# Patient Record
Sex: Female | Born: 1975 | Race: Black or African American | Hispanic: No | Marital: Married
Health system: Southern US, Community
[De-identification: ages and names within clinical notes are randomized; demographics above are authoritative.]

## PROBLEM LIST (undated history)

## (undated) ENCOUNTER — Inpatient Hospital Stay (HOSPITAL_COMMUNITY): Payer: Self-pay

## (undated) DIAGNOSIS — Z5189 Encounter for other specified aftercare: Secondary | ICD-10-CM

## (undated) DIAGNOSIS — F32A Depression, unspecified: Secondary | ICD-10-CM

## (undated) DIAGNOSIS — IMO0002 Reserved for concepts with insufficient information to code with codable children: Secondary | ICD-10-CM

## (undated) DIAGNOSIS — G4730 Sleep apnea, unspecified: Secondary | ICD-10-CM

## (undated) DIAGNOSIS — M797 Fibromyalgia: Secondary | ICD-10-CM

## (undated) DIAGNOSIS — F329 Major depressive disorder, single episode, unspecified: Secondary | ICD-10-CM

## (undated) DIAGNOSIS — R42 Dizziness and giddiness: Secondary | ICD-10-CM

## (undated) DIAGNOSIS — Z8489 Family history of other specified conditions: Secondary | ICD-10-CM

## (undated) DIAGNOSIS — R51 Headache: Secondary | ICD-10-CM

## (undated) DIAGNOSIS — F419 Anxiety disorder, unspecified: Secondary | ICD-10-CM

## (undated) DIAGNOSIS — Z8742 Personal history of other diseases of the female genital tract: Secondary | ICD-10-CM

## (undated) DIAGNOSIS — E0500 Thyrotoxicosis with diffuse goiter without thyrotoxic crisis or storm: Secondary | ICD-10-CM

## (undated) DIAGNOSIS — Z87898 Personal history of other specified conditions: Secondary | ICD-10-CM

## (undated) DIAGNOSIS — G7000 Myasthenia gravis without (acute) exacerbation: Secondary | ICD-10-CM

## (undated) DIAGNOSIS — B009 Herpesviral infection, unspecified: Secondary | ICD-10-CM

## (undated) DIAGNOSIS — N979 Female infertility, unspecified: Secondary | ICD-10-CM

## (undated) DIAGNOSIS — R079 Chest pain, unspecified: Secondary | ICD-10-CM

## (undated) DIAGNOSIS — K219 Gastro-esophageal reflux disease without esophagitis: Secondary | ICD-10-CM

## (undated) DIAGNOSIS — F319 Bipolar disorder, unspecified: Secondary | ICD-10-CM

## (undated) DIAGNOSIS — G500 Trigeminal neuralgia: Secondary | ICD-10-CM

## (undated) HISTORY — DX: Herpesviral infection, unspecified: B00.9

## (undated) HISTORY — DX: Headache: R51

## (undated) HISTORY — DX: Thyrotoxicosis with diffuse goiter without thyrotoxic crisis or storm: E05.00

## (undated) HISTORY — DX: Major depressive disorder, single episode, unspecified: F32.9

## (undated) HISTORY — DX: Bipolar disorder, unspecified: F31.9

## (undated) HISTORY — DX: Depression, unspecified: F32.A

## (undated) HISTORY — DX: Personal history of other diseases of the female genital tract: Z87.42

## (undated) HISTORY — PX: WISDOM TOOTH EXTRACTION: SHX21

## (undated) HISTORY — DX: Personal history of other specified conditions: Z87.898

## (undated) HISTORY — DX: Female infertility, unspecified: N97.9

## (undated) HISTORY — DX: Reserved for concepts with insufficient information to code with codable children: IMO0002

---

## 1996-03-20 HISTORY — PX: OTHER SURGICAL HISTORY: SHX169

## 1997-06-24 ENCOUNTER — Inpatient Hospital Stay (HOSPITAL_COMMUNITY): Admission: AD | Admit: 1997-06-24 | Discharge: 1997-06-24 | Payer: Self-pay | Admitting: Obstetrics

## 1997-06-29 ENCOUNTER — Ambulatory Visit (HOSPITAL_COMMUNITY): Admission: RE | Admit: 1997-06-29 | Discharge: 1997-06-29 | Payer: Self-pay | Admitting: Pediatrics

## 1997-07-16 ENCOUNTER — Ambulatory Visit (HOSPITAL_COMMUNITY): Admission: RE | Admit: 1997-07-16 | Discharge: 1997-07-16 | Payer: Self-pay | Admitting: Pediatrics

## 1997-08-25 ENCOUNTER — Ambulatory Visit (HOSPITAL_COMMUNITY): Admission: RE | Admit: 1997-08-25 | Discharge: 1997-08-25 | Payer: Self-pay | Admitting: Pediatrics

## 1997-09-14 ENCOUNTER — Encounter: Admission: RE | Admit: 1997-09-14 | Discharge: 1997-12-13 | Payer: Self-pay | Admitting: Pediatrics

## 1997-09-22 ENCOUNTER — Other Ambulatory Visit: Admission: RE | Admit: 1997-09-22 | Discharge: 1997-09-22 | Payer: Self-pay | Admitting: Obstetrics

## 1997-10-20 ENCOUNTER — Inpatient Hospital Stay (HOSPITAL_COMMUNITY): Admission: AD | Admit: 1997-10-20 | Discharge: 1997-10-20 | Payer: Self-pay | Admitting: *Deleted

## 1997-12-24 ENCOUNTER — Encounter: Payer: Self-pay | Admitting: Emergency Medicine

## 1997-12-24 ENCOUNTER — Emergency Department (HOSPITAL_COMMUNITY): Admission: EM | Admit: 1997-12-24 | Discharge: 1997-12-24 | Payer: Self-pay | Admitting: Emergency Medicine

## 1997-12-31 ENCOUNTER — Ambulatory Visit (HOSPITAL_COMMUNITY): Admission: RE | Admit: 1997-12-31 | Discharge: 1997-12-31 | Payer: Self-pay | Admitting: Pediatrics

## 1998-01-08 ENCOUNTER — Other Ambulatory Visit: Admission: RE | Admit: 1998-01-08 | Discharge: 1998-01-08 | Payer: Self-pay | Admitting: Obstetrics

## 1998-01-11 ENCOUNTER — Ambulatory Visit (HOSPITAL_COMMUNITY): Admission: RE | Admit: 1998-01-11 | Discharge: 1998-01-11 | Payer: Self-pay | Admitting: Pediatrics

## 1998-01-12 ENCOUNTER — Encounter: Payer: Self-pay | Admitting: Pediatrics

## 1998-01-19 ENCOUNTER — Encounter: Payer: Self-pay | Admitting: Pediatrics

## 1998-01-19 ENCOUNTER — Ambulatory Visit (HOSPITAL_COMMUNITY): Admission: RE | Admit: 1998-01-19 | Discharge: 1998-01-19 | Payer: Self-pay | Admitting: Pediatrics

## 1998-03-02 ENCOUNTER — Ambulatory Visit (HOSPITAL_COMMUNITY): Admission: RE | Admit: 1998-03-02 | Discharge: 1998-03-02 | Payer: Self-pay | Admitting: Pediatrics

## 1998-03-05 ENCOUNTER — Ambulatory Visit (HOSPITAL_COMMUNITY): Admission: RE | Admit: 1998-03-05 | Discharge: 1998-03-05 | Payer: Self-pay | Admitting: Pediatrics

## 1998-04-04 ENCOUNTER — Emergency Department (HOSPITAL_COMMUNITY): Admission: EM | Admit: 1998-04-04 | Discharge: 1998-04-05 | Payer: Self-pay | Admitting: Emergency Medicine

## 1998-04-05 ENCOUNTER — Encounter: Payer: Self-pay | Admitting: Emergency Medicine

## 1998-04-22 ENCOUNTER — Ambulatory Visit (HOSPITAL_COMMUNITY): Admission: RE | Admit: 1998-04-22 | Discharge: 1998-04-22 | Payer: Self-pay | Admitting: Pediatrics

## 1998-05-03 ENCOUNTER — Inpatient Hospital Stay (HOSPITAL_COMMUNITY): Admission: AD | Admit: 1998-05-03 | Discharge: 1998-05-03 | Payer: Self-pay | Admitting: Obstetrics

## 1998-08-31 ENCOUNTER — Ambulatory Visit (HOSPITAL_COMMUNITY): Admission: RE | Admit: 1998-08-31 | Discharge: 1998-08-31 | Payer: Self-pay | Admitting: Pediatrics

## 1998-11-03 ENCOUNTER — Ambulatory Visit (HOSPITAL_COMMUNITY): Admission: RE | Admit: 1998-11-03 | Discharge: 1998-11-03 | Payer: Self-pay | Admitting: Pediatrics

## 1998-11-29 ENCOUNTER — Inpatient Hospital Stay (HOSPITAL_COMMUNITY): Admission: AD | Admit: 1998-11-29 | Discharge: 1998-11-29 | Payer: Self-pay | Admitting: Internal Medicine

## 1998-12-18 ENCOUNTER — Emergency Department (HOSPITAL_COMMUNITY): Admission: EM | Admit: 1998-12-18 | Discharge: 1998-12-19 | Payer: Self-pay | Admitting: Emergency Medicine

## 1998-12-19 ENCOUNTER — Encounter: Payer: Self-pay | Admitting: Emergency Medicine

## 1999-02-13 ENCOUNTER — Inpatient Hospital Stay (HOSPITAL_COMMUNITY): Admission: AD | Admit: 1999-02-13 | Discharge: 1999-02-13 | Payer: Self-pay | Admitting: *Deleted

## 1999-02-22 ENCOUNTER — Other Ambulatory Visit: Admission: RE | Admit: 1999-02-22 | Discharge: 1999-02-22 | Payer: Self-pay | Admitting: Obstetrics

## 1999-06-21 ENCOUNTER — Emergency Department (HOSPITAL_COMMUNITY): Admission: EM | Admit: 1999-06-21 | Discharge: 1999-06-21 | Payer: Self-pay | Admitting: Emergency Medicine

## 1999-08-26 ENCOUNTER — Emergency Department (HOSPITAL_COMMUNITY): Admission: EM | Admit: 1999-08-26 | Discharge: 1999-08-26 | Payer: Self-pay | Admitting: Emergency Medicine

## 1999-09-01 ENCOUNTER — Emergency Department (HOSPITAL_COMMUNITY): Admission: EM | Admit: 1999-09-01 | Discharge: 1999-09-01 | Payer: Self-pay | Admitting: *Deleted

## 1999-09-08 ENCOUNTER — Encounter: Payer: Self-pay | Admitting: Emergency Medicine

## 1999-09-08 ENCOUNTER — Emergency Department (HOSPITAL_COMMUNITY): Admission: EM | Admit: 1999-09-08 | Discharge: 1999-09-08 | Payer: Self-pay | Admitting: Emergency Medicine

## 1999-09-09 ENCOUNTER — Encounter (INDEPENDENT_AMBULATORY_CARE_PROVIDER_SITE_OTHER): Payer: Self-pay | Admitting: Specialist

## 1999-09-09 ENCOUNTER — Encounter: Payer: Self-pay | Admitting: General Surgery

## 1999-09-09 ENCOUNTER — Inpatient Hospital Stay: Admission: EM | Admit: 1999-09-09 | Discharge: 1999-09-11 | Payer: Self-pay | Admitting: General Surgery

## 1999-09-10 ENCOUNTER — Encounter: Payer: Self-pay | Admitting: General Surgery

## 1999-10-24 ENCOUNTER — Emergency Department (HOSPITAL_COMMUNITY): Admission: EM | Admit: 1999-10-24 | Discharge: 1999-10-25 | Payer: Self-pay

## 2000-01-03 ENCOUNTER — Inpatient Hospital Stay (HOSPITAL_COMMUNITY): Admission: AD | Admit: 2000-01-03 | Discharge: 2000-01-03 | Payer: Self-pay | Admitting: Obstetrics

## 2000-01-04 ENCOUNTER — Encounter: Admission: RE | Admit: 2000-01-04 | Discharge: 2000-01-04 | Payer: Self-pay | Admitting: Neurology

## 2000-01-04 ENCOUNTER — Encounter: Payer: Self-pay | Admitting: Neurology

## 2000-02-25 ENCOUNTER — Encounter: Payer: Self-pay | Admitting: Emergency Medicine

## 2000-02-25 ENCOUNTER — Emergency Department (HOSPITAL_COMMUNITY): Admission: EM | Admit: 2000-02-25 | Discharge: 2000-02-25 | Payer: Self-pay | Admitting: Emergency Medicine

## 2000-03-23 ENCOUNTER — Encounter: Payer: Self-pay | Admitting: Emergency Medicine

## 2000-03-23 ENCOUNTER — Emergency Department (HOSPITAL_COMMUNITY): Admission: EM | Admit: 2000-03-23 | Discharge: 2000-03-23 | Payer: Self-pay | Admitting: Emergency Medicine

## 2000-05-10 ENCOUNTER — Inpatient Hospital Stay (HOSPITAL_COMMUNITY): Admission: AD | Admit: 2000-05-10 | Discharge: 2000-05-10 | Payer: Self-pay | Admitting: Obstetrics

## 2000-05-24 ENCOUNTER — Encounter: Admission: RE | Admit: 2000-05-24 | Discharge: 2000-06-14 | Payer: Self-pay | Admitting: Orthopedic Surgery

## 2000-11-15 ENCOUNTER — Emergency Department (HOSPITAL_COMMUNITY): Admission: EM | Admit: 2000-11-15 | Discharge: 2000-11-15 | Payer: Self-pay | Admitting: Emergency Medicine

## 2000-11-17 ENCOUNTER — Emergency Department (HOSPITAL_COMMUNITY): Admission: EM | Admit: 2000-11-17 | Discharge: 2000-11-17 | Payer: Self-pay | Admitting: Emergency Medicine

## 2000-11-28 ENCOUNTER — Encounter: Admission: RE | Admit: 2000-11-28 | Discharge: 2000-12-26 | Payer: Self-pay | Admitting: Pediatrics

## 2000-12-02 ENCOUNTER — Inpatient Hospital Stay (HOSPITAL_COMMUNITY): Admission: AD | Admit: 2000-12-02 | Discharge: 2000-12-02 | Payer: Self-pay | Admitting: Obstetrics

## 2000-12-13 ENCOUNTER — Inpatient Hospital Stay (HOSPITAL_COMMUNITY): Admission: AD | Admit: 2000-12-13 | Discharge: 2000-12-13 | Payer: Self-pay | Admitting: Obstetrics

## 2001-01-25 ENCOUNTER — Emergency Department (HOSPITAL_COMMUNITY): Admission: EM | Admit: 2001-01-25 | Discharge: 2001-01-25 | Payer: Self-pay | Admitting: *Deleted

## 2001-01-26 ENCOUNTER — Emergency Department (HOSPITAL_COMMUNITY): Admission: EM | Admit: 2001-01-26 | Discharge: 2001-01-26 | Payer: Self-pay | Admitting: Emergency Medicine

## 2001-03-20 HISTORY — PX: CHOLECYSTECTOMY: SHX55

## 2001-06-12 ENCOUNTER — Inpatient Hospital Stay (HOSPITAL_COMMUNITY): Admission: AD | Admit: 2001-06-12 | Discharge: 2001-06-12 | Payer: Self-pay | Admitting: *Deleted

## 2001-06-12 ENCOUNTER — Encounter: Admission: RE | Admit: 2001-06-12 | Discharge: 2001-09-10 | Payer: Self-pay | Admitting: Pediatrics

## 2001-08-17 ENCOUNTER — Emergency Department (HOSPITAL_COMMUNITY): Admission: EM | Admit: 2001-08-17 | Discharge: 2001-08-17 | Payer: Self-pay | Admitting: Emergency Medicine

## 2001-11-24 ENCOUNTER — Emergency Department (HOSPITAL_COMMUNITY): Admission: EM | Admit: 2001-11-24 | Discharge: 2001-11-24 | Payer: Self-pay | Admitting: Emergency Medicine

## 2001-11-24 ENCOUNTER — Encounter: Payer: Self-pay | Admitting: Emergency Medicine

## 2001-11-26 ENCOUNTER — Encounter: Admission: RE | Admit: 2001-11-26 | Discharge: 2001-12-19 | Payer: Self-pay | Admitting: Orthopedic Surgery

## 2001-12-16 ENCOUNTER — Other Ambulatory Visit: Admission: RE | Admit: 2001-12-16 | Discharge: 2001-12-16 | Payer: Self-pay | Admitting: Obstetrics and Gynecology

## 2002-02-05 ENCOUNTER — Encounter: Admission: RE | Admit: 2002-02-05 | Discharge: 2002-05-06 | Payer: Self-pay | Admitting: Pediatrics

## 2002-03-03 ENCOUNTER — Ambulatory Visit (HOSPITAL_COMMUNITY): Admission: RE | Admit: 2002-03-03 | Discharge: 2002-03-03 | Payer: Self-pay | Admitting: Obstetrics and Gynecology

## 2002-03-03 ENCOUNTER — Encounter: Payer: Self-pay | Admitting: Obstetrics and Gynecology

## 2002-04-07 ENCOUNTER — Inpatient Hospital Stay (HOSPITAL_COMMUNITY): Admission: AD | Admit: 2002-04-07 | Discharge: 2002-04-07 | Payer: Self-pay | Admitting: Obstetrics and Gynecology

## 2002-04-18 ENCOUNTER — Encounter: Payer: Self-pay | Admitting: Obstetrics and Gynecology

## 2002-04-18 ENCOUNTER — Ambulatory Visit (HOSPITAL_COMMUNITY): Admission: RE | Admit: 2002-04-18 | Discharge: 2002-04-18 | Payer: Self-pay | Admitting: Obstetrics and Gynecology

## 2002-04-22 ENCOUNTER — Inpatient Hospital Stay (HOSPITAL_COMMUNITY): Admission: AD | Admit: 2002-04-22 | Discharge: 2002-04-22 | Payer: Self-pay | Admitting: Obstetrics and Gynecology

## 2002-05-15 ENCOUNTER — Ambulatory Visit (HOSPITAL_COMMUNITY): Admission: RE | Admit: 2002-05-15 | Discharge: 2002-05-15 | Payer: Self-pay | Admitting: Obstetrics and Gynecology

## 2002-05-15 ENCOUNTER — Encounter: Payer: Self-pay | Admitting: Obstetrics and Gynecology

## 2002-06-06 ENCOUNTER — Ambulatory Visit (HOSPITAL_COMMUNITY): Admission: RE | Admit: 2002-06-06 | Discharge: 2002-06-06 | Payer: Self-pay | Admitting: Obstetrics and Gynecology

## 2002-06-06 ENCOUNTER — Encounter: Payer: Self-pay | Admitting: Obstetrics and Gynecology

## 2002-06-16 ENCOUNTER — Inpatient Hospital Stay (HOSPITAL_COMMUNITY): Admission: AD | Admit: 2002-06-16 | Discharge: 2002-06-16 | Payer: Self-pay | Admitting: Obstetrics and Gynecology

## 2002-06-28 ENCOUNTER — Inpatient Hospital Stay (HOSPITAL_COMMUNITY): Admission: AD | Admit: 2002-06-28 | Discharge: 2002-06-28 | Payer: Self-pay | Admitting: Obstetrics and Gynecology

## 2002-07-02 ENCOUNTER — Encounter: Payer: Self-pay | Admitting: Obstetrics and Gynecology

## 2002-07-02 ENCOUNTER — Ambulatory Visit (HOSPITAL_COMMUNITY): Admission: RE | Admit: 2002-07-02 | Discharge: 2002-07-02 | Payer: Self-pay | Admitting: Obstetrics and Gynecology

## 2002-07-03 ENCOUNTER — Ambulatory Visit (HOSPITAL_COMMUNITY): Admission: RE | Admit: 2002-07-03 | Discharge: 2002-07-03 | Payer: Self-pay | Admitting: Obstetrics and Gynecology

## 2002-07-03 ENCOUNTER — Encounter: Payer: Self-pay | Admitting: Obstetrics and Gynecology

## 2002-07-08 ENCOUNTER — Inpatient Hospital Stay (HOSPITAL_COMMUNITY): Admission: AD | Admit: 2002-07-08 | Discharge: 2002-07-08 | Payer: Self-pay | Admitting: Obstetrics and Gynecology

## 2002-07-09 ENCOUNTER — Inpatient Hospital Stay (HOSPITAL_COMMUNITY): Admission: AD | Admit: 2002-07-09 | Discharge: 2002-07-09 | Payer: Self-pay | Admitting: Obstetrics and Gynecology

## 2002-07-15 ENCOUNTER — Inpatient Hospital Stay (HOSPITAL_COMMUNITY): Admission: AD | Admit: 2002-07-15 | Discharge: 2002-07-16 | Payer: Self-pay | Admitting: Obstetrics and Gynecology

## 2002-07-16 ENCOUNTER — Encounter: Payer: Self-pay | Admitting: Obstetrics and Gynecology

## 2002-07-21 ENCOUNTER — Inpatient Hospital Stay (HOSPITAL_COMMUNITY): Admission: AD | Admit: 2002-07-21 | Discharge: 2002-07-24 | Payer: Self-pay | Admitting: Obstetrics and Gynecology

## 2002-09-05 ENCOUNTER — Encounter: Payer: Self-pay | Admitting: Emergency Medicine

## 2002-09-05 ENCOUNTER — Emergency Department (HOSPITAL_COMMUNITY): Admission: EM | Admit: 2002-09-05 | Discharge: 2002-09-05 | Payer: Self-pay | Admitting: Emergency Medicine

## 2002-09-23 ENCOUNTER — Encounter (INDEPENDENT_AMBULATORY_CARE_PROVIDER_SITE_OTHER): Payer: Self-pay | Admitting: *Deleted

## 2002-09-23 ENCOUNTER — Ambulatory Visit (HOSPITAL_COMMUNITY): Admission: RE | Admit: 2002-09-23 | Discharge: 2002-09-23 | Payer: Self-pay | Admitting: General Surgery

## 2002-12-22 ENCOUNTER — Other Ambulatory Visit: Admission: RE | Admit: 2002-12-22 | Discharge: 2002-12-22 | Payer: Self-pay | Admitting: Obstetrics and Gynecology

## 2003-03-21 HISTORY — PX: ABDOMINAL HERNIA REPAIR: SHX539

## 2003-04-15 ENCOUNTER — Ambulatory Visit (HOSPITAL_COMMUNITY): Admission: RE | Admit: 2003-04-15 | Discharge: 2003-04-15 | Payer: Self-pay | Admitting: Internal Medicine

## 2003-08-19 ENCOUNTER — Encounter: Admission: RE | Admit: 2003-08-19 | Discharge: 2003-08-19 | Payer: Self-pay | Admitting: Family Medicine

## 2003-10-13 ENCOUNTER — Encounter: Admission: RE | Admit: 2003-10-13 | Discharge: 2003-10-13 | Payer: Self-pay | Admitting: Sports Medicine

## 2003-10-21 ENCOUNTER — Encounter: Admission: RE | Admit: 2003-10-21 | Discharge: 2003-10-21 | Payer: Self-pay | Admitting: Sports Medicine

## 2003-10-22 ENCOUNTER — Encounter: Admission: RE | Admit: 2003-10-22 | Discharge: 2003-10-22 | Payer: Self-pay | Admitting: Sports Medicine

## 2003-11-27 ENCOUNTER — Ambulatory Visit: Payer: Self-pay | Admitting: Family Medicine

## 2003-11-30 ENCOUNTER — Emergency Department (HOSPITAL_COMMUNITY): Admission: EM | Admit: 2003-11-30 | Discharge: 2003-11-30 | Payer: Self-pay | Admitting: Emergency Medicine

## 2003-12-07 ENCOUNTER — Ambulatory Visit (HOSPITAL_COMMUNITY): Admission: RE | Admit: 2003-12-07 | Discharge: 2003-12-07 | Payer: Self-pay | Admitting: Sports Medicine

## 2003-12-23 ENCOUNTER — Encounter (INDEPENDENT_AMBULATORY_CARE_PROVIDER_SITE_OTHER): Payer: Self-pay | Admitting: *Deleted

## 2003-12-23 LAB — CONVERTED CEMR LAB

## 2003-12-29 ENCOUNTER — Other Ambulatory Visit: Admission: RE | Admit: 2003-12-29 | Discharge: 2003-12-29 | Payer: Self-pay | Admitting: Obstetrics and Gynecology

## 2004-01-04 ENCOUNTER — Emergency Department (HOSPITAL_COMMUNITY): Admission: EM | Admit: 2004-01-04 | Discharge: 2004-01-04 | Payer: Self-pay | Admitting: Emergency Medicine

## 2004-01-13 ENCOUNTER — Ambulatory Visit: Payer: Self-pay | Admitting: Family Medicine

## 2004-02-11 ENCOUNTER — Emergency Department (HOSPITAL_COMMUNITY): Admission: EM | Admit: 2004-02-11 | Discharge: 2004-02-11 | Payer: Self-pay | Admitting: Family Medicine

## 2004-02-16 ENCOUNTER — Ambulatory Visit: Payer: Self-pay | Admitting: Sports Medicine

## 2004-04-13 ENCOUNTER — Ambulatory Visit: Payer: Self-pay | Admitting: Family Medicine

## 2004-05-11 ENCOUNTER — Ambulatory Visit: Payer: Self-pay | Admitting: Family Medicine

## 2004-07-31 ENCOUNTER — Emergency Department (HOSPITAL_COMMUNITY): Admission: EM | Admit: 2004-07-31 | Discharge: 2004-07-31 | Payer: Self-pay | Admitting: Family Medicine

## 2004-08-19 ENCOUNTER — Ambulatory Visit (HOSPITAL_COMMUNITY): Admission: RE | Admit: 2004-08-19 | Discharge: 2004-08-19 | Payer: Self-pay | Admitting: Family Medicine

## 2004-08-19 ENCOUNTER — Ambulatory Visit: Payer: Self-pay | Admitting: Family Medicine

## 2004-09-14 ENCOUNTER — Ambulatory Visit (HOSPITAL_COMMUNITY): Admission: RE | Admit: 2004-09-14 | Discharge: 2004-09-14 | Payer: Self-pay | Admitting: Sports Medicine

## 2004-09-14 ENCOUNTER — Encounter: Payer: Self-pay | Admitting: Cardiology

## 2004-09-14 ENCOUNTER — Ambulatory Visit: Payer: Self-pay | Admitting: Cardiology

## 2004-09-30 ENCOUNTER — Ambulatory Visit: Payer: Self-pay | Admitting: Family Medicine

## 2004-10-03 ENCOUNTER — Ambulatory Visit: Payer: Self-pay | Admitting: Sports Medicine

## 2004-11-21 ENCOUNTER — Inpatient Hospital Stay (HOSPITAL_COMMUNITY): Admission: AD | Admit: 2004-11-21 | Discharge: 2004-11-21 | Payer: Self-pay | Admitting: Obstetrics and Gynecology

## 2004-12-15 ENCOUNTER — Inpatient Hospital Stay (HOSPITAL_COMMUNITY): Admission: AD | Admit: 2004-12-15 | Discharge: 2004-12-16 | Payer: Self-pay | Admitting: Obstetrics and Gynecology

## 2004-12-18 ENCOUNTER — Inpatient Hospital Stay (HOSPITAL_COMMUNITY): Admission: AD | Admit: 2004-12-18 | Discharge: 2004-12-18 | Payer: Self-pay | Admitting: Obstetrics and Gynecology

## 2004-12-28 ENCOUNTER — Ambulatory Visit: Payer: Self-pay | Admitting: Family Medicine

## 2005-02-01 ENCOUNTER — Other Ambulatory Visit: Admission: RE | Admit: 2005-02-01 | Discharge: 2005-02-01 | Payer: Self-pay | Admitting: Obstetrics and Gynecology

## 2005-02-02 ENCOUNTER — Encounter: Admission: RE | Admit: 2005-02-02 | Discharge: 2005-02-02 | Payer: Self-pay | Admitting: Obstetrics and Gynecology

## 2005-02-21 ENCOUNTER — Emergency Department (HOSPITAL_COMMUNITY): Admission: EM | Admit: 2005-02-21 | Discharge: 2005-02-21 | Payer: Self-pay | Admitting: Family Medicine

## 2005-02-22 ENCOUNTER — Emergency Department (HOSPITAL_COMMUNITY): Admission: EM | Admit: 2005-02-22 | Discharge: 2005-02-22 | Payer: Self-pay | Admitting: Family Medicine

## 2005-02-23 ENCOUNTER — Ambulatory Visit: Payer: Self-pay | Admitting: Internal Medicine

## 2005-03-02 ENCOUNTER — Inpatient Hospital Stay (HOSPITAL_COMMUNITY): Admission: AD | Admit: 2005-03-02 | Discharge: 2005-03-02 | Payer: Self-pay | Admitting: Obstetrics and Gynecology

## 2005-03-03 ENCOUNTER — Ambulatory Visit: Payer: Self-pay | Admitting: Internal Medicine

## 2005-03-15 ENCOUNTER — Ambulatory Visit: Payer: Self-pay | Admitting: Internal Medicine

## 2005-04-04 ENCOUNTER — Ambulatory Visit (HOSPITAL_COMMUNITY): Admission: RE | Admit: 2005-04-04 | Discharge: 2005-04-04 | Payer: Self-pay | Admitting: Obstetrics and Gynecology

## 2005-04-18 ENCOUNTER — Ambulatory Visit: Payer: Self-pay | Admitting: Internal Medicine

## 2005-04-23 ENCOUNTER — Inpatient Hospital Stay (HOSPITAL_COMMUNITY): Admission: AD | Admit: 2005-04-23 | Discharge: 2005-04-23 | Payer: Self-pay | Admitting: Obstetrics and Gynecology

## 2005-05-23 ENCOUNTER — Ambulatory Visit: Payer: Self-pay | Admitting: Pulmonary Disease

## 2005-05-26 ENCOUNTER — Ambulatory Visit: Payer: Self-pay | Admitting: Internal Medicine

## 2005-05-28 ENCOUNTER — Inpatient Hospital Stay (HOSPITAL_COMMUNITY): Admission: EM | Admit: 2005-05-28 | Discharge: 2005-05-29 | Payer: Self-pay | Admitting: Emergency Medicine

## 2005-05-28 ENCOUNTER — Ambulatory Visit: Payer: Self-pay | Admitting: Family Medicine

## 2005-06-08 ENCOUNTER — Ambulatory Visit: Payer: Self-pay | Admitting: Internal Medicine

## 2005-06-22 ENCOUNTER — Ambulatory Visit: Payer: Self-pay | Admitting: Pulmonary Disease

## 2005-07-15 ENCOUNTER — Inpatient Hospital Stay (HOSPITAL_COMMUNITY): Admission: AD | Admit: 2005-07-15 | Discharge: 2005-07-15 | Payer: Self-pay | Admitting: Obstetrics and Gynecology

## 2005-07-20 ENCOUNTER — Ambulatory Visit: Payer: Self-pay | Admitting: Internal Medicine

## 2005-07-24 ENCOUNTER — Inpatient Hospital Stay (HOSPITAL_COMMUNITY): Admission: AD | Admit: 2005-07-24 | Discharge: 2005-07-24 | Payer: Self-pay | Admitting: Obstetrics and Gynecology

## 2005-07-26 ENCOUNTER — Inpatient Hospital Stay (HOSPITAL_COMMUNITY): Admission: AD | Admit: 2005-07-26 | Discharge: 2005-07-26 | Payer: Self-pay | Admitting: Obstetrics and Gynecology

## 2005-08-10 ENCOUNTER — Inpatient Hospital Stay (HOSPITAL_COMMUNITY): Admission: AD | Admit: 2005-08-10 | Discharge: 2005-08-13 | Payer: Self-pay | Admitting: Obstetrics and Gynecology

## 2005-09-27 ENCOUNTER — Ambulatory Visit: Payer: Self-pay | Admitting: Family Medicine

## 2005-10-27 ENCOUNTER — Inpatient Hospital Stay (HOSPITAL_COMMUNITY): Admission: AD | Admit: 2005-10-27 | Discharge: 2005-10-27 | Payer: Self-pay | Admitting: Obstetrics and Gynecology

## 2005-12-05 ENCOUNTER — Ambulatory Visit: Payer: Self-pay | Admitting: Family Medicine

## 2006-03-21 ENCOUNTER — Ambulatory Visit: Payer: Self-pay | Admitting: Sports Medicine

## 2006-03-21 ENCOUNTER — Encounter (INDEPENDENT_AMBULATORY_CARE_PROVIDER_SITE_OTHER): Payer: Self-pay | Admitting: Family Medicine

## 2006-03-21 LAB — CONVERTED CEMR LAB
Cholesterol: 114 mg/dL (ref 0–200)
Free T4: 1.32 ng/dL (ref 0.89–1.80)
HDL: 35 mg/dL — ABNORMAL LOW (ref >39)
LDL Cholesterol: 72 mg/dL (ref 0–99)
T3, Free: 2.4 pg/mL (ref 2.3–4.2)
TSH: 0.931 microintl units/mL (ref 0.350–5.50)
Total CHOL/HDL Ratio: 3.3
Triglycerides: 36 mg/dL (ref <150)
VLDL: 7 mg/dL (ref 0–40)

## 2006-03-23 ENCOUNTER — Ambulatory Visit: Payer: Self-pay | Admitting: Family Medicine

## 2006-04-07 ENCOUNTER — Emergency Department (HOSPITAL_COMMUNITY): Admission: EM | Admit: 2006-04-07 | Discharge: 2006-04-07 | Payer: Self-pay | Admitting: Emergency Medicine

## 2006-05-17 DIAGNOSIS — G7000 Myasthenia gravis without (acute) exacerbation: Secondary | ICD-10-CM | POA: Insufficient documentation

## 2006-05-17 DIAGNOSIS — J45909 Unspecified asthma, uncomplicated: Secondary | ICD-10-CM | POA: Insufficient documentation

## 2006-05-17 DIAGNOSIS — J309 Allergic rhinitis, unspecified: Secondary | ICD-10-CM | POA: Insufficient documentation

## 2006-05-17 DIAGNOSIS — E1169 Type 2 diabetes mellitus with other specified complication: Secondary | ICD-10-CM | POA: Insufficient documentation

## 2006-05-17 DIAGNOSIS — E118 Type 2 diabetes mellitus with unspecified complications: Secondary | ICD-10-CM | POA: Insufficient documentation

## 2006-05-17 DIAGNOSIS — E669 Obesity, unspecified: Secondary | ICD-10-CM | POA: Insufficient documentation

## 2006-05-17 DIAGNOSIS — E039 Hypothyroidism, unspecified: Secondary | ICD-10-CM | POA: Insufficient documentation

## 2006-05-18 ENCOUNTER — Encounter (INDEPENDENT_AMBULATORY_CARE_PROVIDER_SITE_OTHER): Payer: Self-pay | Admitting: *Deleted

## 2006-06-22 ENCOUNTER — Emergency Department (HOSPITAL_COMMUNITY): Admission: EM | Admit: 2006-06-22 | Discharge: 2006-06-22 | Payer: Self-pay | Admitting: Emergency Medicine

## 2006-08-19 ENCOUNTER — Emergency Department (HOSPITAL_COMMUNITY): Admission: EM | Admit: 2006-08-19 | Discharge: 2006-08-19 | Payer: Self-pay | Admitting: Family Medicine

## 2006-08-20 ENCOUNTER — Telehealth: Payer: Self-pay | Admitting: *Deleted

## 2006-08-30 ENCOUNTER — Ambulatory Visit: Payer: Self-pay | Admitting: Family Medicine

## 2006-08-30 ENCOUNTER — Encounter (INDEPENDENT_AMBULATORY_CARE_PROVIDER_SITE_OTHER): Payer: Self-pay | Admitting: Family Medicine

## 2006-08-30 LAB — CONVERTED CEMR LAB
Free T4: 1.24 ng/dL (ref 0.89–1.80)
T3, Free: 2 pg/mL — ABNORMAL LOW (ref 2.3–4.2)
TSH: 4.128 microintl units/mL (ref 0.350–5.50)

## 2006-09-10 ENCOUNTER — Telehealth (INDEPENDENT_AMBULATORY_CARE_PROVIDER_SITE_OTHER): Payer: Self-pay | Admitting: Family Medicine

## 2006-10-11 ENCOUNTER — Telehealth: Payer: Self-pay | Admitting: *Deleted

## 2006-10-12 ENCOUNTER — Telehealth: Payer: Self-pay | Admitting: *Deleted

## 2006-10-15 ENCOUNTER — Telehealth (INDEPENDENT_AMBULATORY_CARE_PROVIDER_SITE_OTHER): Payer: Self-pay | Admitting: Family Medicine

## 2006-10-16 ENCOUNTER — Telehealth (INDEPENDENT_AMBULATORY_CARE_PROVIDER_SITE_OTHER): Payer: Self-pay | Admitting: Family Medicine

## 2006-10-31 ENCOUNTER — Encounter (INDEPENDENT_AMBULATORY_CARE_PROVIDER_SITE_OTHER): Payer: Self-pay | Admitting: Family Medicine

## 2006-10-31 ENCOUNTER — Ambulatory Visit: Payer: Self-pay | Admitting: Family Medicine

## 2006-10-31 DIAGNOSIS — G4700 Insomnia, unspecified: Secondary | ICD-10-CM | POA: Insufficient documentation

## 2006-10-31 DIAGNOSIS — E890 Postprocedural hypothyroidism: Secondary | ICD-10-CM | POA: Insufficient documentation

## 2006-10-31 LAB — CONVERTED CEMR LAB
BUN: 14 mg/dL (ref 6–23)
CO2: 26 meq/L (ref 19–32)
Calcium: 9.1 mg/dL (ref 8.4–10.5)
Chloride: 104 meq/L (ref 96–112)
Creatinine, Ser: 0.64 mg/dL (ref 0.40–1.20)
Free T4: 1.22 ng/dL (ref 0.89–1.80)
Glucose, Bld: 90 mg/dL (ref 70–99)
Potassium: 4 meq/L (ref 3.5–5.3)
Sodium: 139 meq/L (ref 135–145)
TSH: 0.438 microintl units/mL (ref 0.350–5.50)

## 2006-11-01 ENCOUNTER — Telehealth (INDEPENDENT_AMBULATORY_CARE_PROVIDER_SITE_OTHER): Payer: Self-pay | Admitting: Family Medicine

## 2007-02-12 ENCOUNTER — Encounter (INDEPENDENT_AMBULATORY_CARE_PROVIDER_SITE_OTHER): Payer: Self-pay | Admitting: Family Medicine

## 2007-02-13 ENCOUNTER — Telehealth: Payer: Self-pay | Admitting: *Deleted

## 2007-02-14 ENCOUNTER — Emergency Department (HOSPITAL_COMMUNITY): Admission: EM | Admit: 2007-02-14 | Discharge: 2007-02-14 | Payer: Self-pay | Admitting: Emergency Medicine

## 2007-06-21 ENCOUNTER — Telehealth (INDEPENDENT_AMBULATORY_CARE_PROVIDER_SITE_OTHER): Payer: Self-pay | Admitting: Family Medicine

## 2007-06-22 ENCOUNTER — Emergency Department (HOSPITAL_COMMUNITY): Admission: EM | Admit: 2007-06-22 | Discharge: 2007-06-22 | Payer: Self-pay | Admitting: Emergency Medicine

## 2007-07-02 ENCOUNTER — Ambulatory Visit: Payer: Self-pay | Admitting: Family Medicine

## 2007-07-02 ENCOUNTER — Telehealth: Payer: Self-pay | Admitting: *Deleted

## 2007-07-02 DIAGNOSIS — R059 Cough, unspecified: Secondary | ICD-10-CM | POA: Insufficient documentation

## 2007-07-02 DIAGNOSIS — R05 Cough: Secondary | ICD-10-CM

## 2007-07-03 ENCOUNTER — Telehealth (INDEPENDENT_AMBULATORY_CARE_PROVIDER_SITE_OTHER): Payer: Self-pay | Admitting: *Deleted

## 2007-07-04 ENCOUNTER — Telehealth: Payer: Self-pay | Admitting: *Deleted

## 2007-07-04 ENCOUNTER — Encounter: Payer: Self-pay | Admitting: *Deleted

## 2007-07-05 ENCOUNTER — Ambulatory Visit: Payer: Self-pay | Admitting: Family Medicine

## 2007-07-05 ENCOUNTER — Encounter (INDEPENDENT_AMBULATORY_CARE_PROVIDER_SITE_OTHER): Payer: Self-pay | Admitting: Family Medicine

## 2007-07-05 DIAGNOSIS — R252 Cramp and spasm: Secondary | ICD-10-CM | POA: Insufficient documentation

## 2007-07-05 LAB — CONVERTED CEMR LAB
BUN: 15 mg/dL (ref 6–23)
CO2: 25 meq/L (ref 19–32)
Calcium: 9.1 mg/dL (ref 8.4–10.5)
Chloride: 105 meq/L (ref 96–112)
Creatinine, Ser: 0.72 mg/dL (ref 0.40–1.20)
Glucose, Bld: 101 mg/dL — ABNORMAL HIGH (ref 70–99)
HCT: 36.7 % (ref 36.0–46.0)
Hemoglobin: 11.6 g/dL — ABNORMAL LOW (ref 12.0–15.0)
MCHC: 31.6 g/dL (ref 30.0–36.0)
MCV: 85.9 fL (ref 78.0–100.0)
Platelets: 258 10*3/uL (ref 150–400)
Potassium: 4.1 meq/L (ref 3.5–5.3)
RBC: 4.27 M/uL (ref 3.87–5.11)
RDW: 14.2 % (ref 11.5–15.5)
Sodium: 141 meq/L (ref 135–145)
WBC: 8.2 10*3/uL (ref 4.0–10.5)

## 2007-07-08 ENCOUNTER — Telehealth (INDEPENDENT_AMBULATORY_CARE_PROVIDER_SITE_OTHER): Payer: Self-pay | Admitting: Family Medicine

## 2007-07-09 ENCOUNTER — Ambulatory Visit: Payer: Self-pay | Admitting: Internal Medicine

## 2007-07-09 ENCOUNTER — Ambulatory Visit: Payer: Self-pay | Admitting: Pulmonary Disease

## 2007-07-09 DIAGNOSIS — J209 Acute bronchitis, unspecified: Secondary | ICD-10-CM | POA: Insufficient documentation

## 2007-07-30 ENCOUNTER — Ambulatory Visit: Payer: Self-pay | Admitting: Internal Medicine

## 2007-10-05 ENCOUNTER — Emergency Department (HOSPITAL_COMMUNITY): Admission: EM | Admit: 2007-10-05 | Discharge: 2007-10-05 | Payer: Self-pay | Admitting: Emergency Medicine

## 2007-12-24 ENCOUNTER — Encounter: Payer: Self-pay | Admitting: *Deleted

## 2008-01-07 ENCOUNTER — Encounter: Payer: Self-pay | Admitting: *Deleted

## 2008-01-22 ENCOUNTER — Emergency Department (HOSPITAL_COMMUNITY): Admission: EM | Admit: 2008-01-22 | Discharge: 2008-01-22 | Payer: Self-pay | Admitting: Emergency Medicine

## 2008-02-10 ENCOUNTER — Telehealth: Payer: Self-pay | Admitting: *Deleted

## 2008-02-17 ENCOUNTER — Encounter: Payer: Self-pay | Admitting: *Deleted

## 2008-02-26 ENCOUNTER — Telehealth: Payer: Self-pay | Admitting: *Deleted

## 2008-03-04 ENCOUNTER — Telehealth (INDEPENDENT_AMBULATORY_CARE_PROVIDER_SITE_OTHER): Payer: Self-pay | Admitting: Family Medicine

## 2008-03-04 ENCOUNTER — Telehealth: Payer: Self-pay | Admitting: *Deleted

## 2008-03-04 ENCOUNTER — Ambulatory Visit: Payer: Self-pay | Admitting: Family Medicine

## 2008-03-04 LAB — CONVERTED CEMR LAB: Hgb A1c MFr Bld: 5.8 %

## 2008-07-24 ENCOUNTER — Emergency Department (HOSPITAL_COMMUNITY): Admission: EM | Admit: 2008-07-24 | Discharge: 2008-07-24 | Payer: Self-pay | Admitting: Family Medicine

## 2008-08-04 ENCOUNTER — Encounter (INDEPENDENT_AMBULATORY_CARE_PROVIDER_SITE_OTHER): Payer: Self-pay | Admitting: Family Medicine

## 2008-08-04 ENCOUNTER — Encounter: Payer: Self-pay | Admitting: *Deleted

## 2008-08-04 ENCOUNTER — Ambulatory Visit: Payer: Self-pay | Admitting: Family Medicine

## 2008-08-04 ENCOUNTER — Telehealth (INDEPENDENT_AMBULATORY_CARE_PROVIDER_SITE_OTHER): Payer: Self-pay | Admitting: Family Medicine

## 2008-08-04 DIAGNOSIS — G43909 Migraine, unspecified, not intractable, without status migrainosus: Secondary | ICD-10-CM | POA: Insufficient documentation

## 2008-08-16 ENCOUNTER — Emergency Department (HOSPITAL_COMMUNITY): Admission: EM | Admit: 2008-08-16 | Discharge: 2008-08-16 | Payer: Self-pay | Admitting: Family Medicine

## 2008-09-13 ENCOUNTER — Emergency Department (HOSPITAL_COMMUNITY): Admission: EM | Admit: 2008-09-13 | Discharge: 2008-09-13 | Payer: Self-pay | Admitting: Family Medicine

## 2008-09-17 ENCOUNTER — Emergency Department (HOSPITAL_COMMUNITY): Admission: EM | Admit: 2008-09-17 | Discharge: 2008-09-17 | Payer: Self-pay | Admitting: Family Medicine

## 2008-09-26 ENCOUNTER — Encounter: Admission: RE | Admit: 2008-09-26 | Discharge: 2008-09-26 | Payer: Self-pay | Admitting: Sports Medicine

## 2008-10-16 ENCOUNTER — Encounter: Payer: Self-pay | Admitting: Family Medicine

## 2008-10-16 ENCOUNTER — Ambulatory Visit: Payer: Self-pay | Admitting: Family Medicine

## 2008-10-16 DIAGNOSIS — F331 Major depressive disorder, recurrent, moderate: Secondary | ICD-10-CM | POA: Insufficient documentation

## 2008-10-16 DIAGNOSIS — L981 Factitial dermatitis: Secondary | ICD-10-CM | POA: Insufficient documentation

## 2008-10-16 DIAGNOSIS — F99 Mental disorder, not otherwise specified: Secondary | ICD-10-CM | POA: Insufficient documentation

## 2008-10-16 LAB — CONVERTED CEMR LAB
ALT: 11 units/L (ref 0–35)
AST: 16 units/L (ref 0–37)
Albumin: 4 g/dL (ref 3.5–5.2)
Alkaline Phosphatase: 64 units/L (ref 39–117)
BUN: 17 mg/dL (ref 6–23)
CO2: 24 meq/L (ref 19–32)
Calcium: 9.3 mg/dL (ref 8.4–10.5)
Chloride: 103 meq/L (ref 96–112)
Creatinine, Ser: 0.72 mg/dL (ref 0.40–1.20)
Glucose, Bld: 91 mg/dL (ref 70–99)
Potassium: 4.1 meq/L (ref 3.5–5.3)
Sodium: 139 meq/L (ref 135–145)
Total Bilirubin: 0.3 mg/dL (ref 0.3–1.2)
Total Protein: 7.5 g/dL (ref 6.0–8.3)

## 2008-10-19 ENCOUNTER — Telehealth: Payer: Self-pay | Admitting: *Deleted

## 2008-11-04 ENCOUNTER — Encounter: Admission: RE | Admit: 2008-11-04 | Discharge: 2009-02-02 | Payer: Self-pay | Admitting: Sports Medicine

## 2008-12-14 ENCOUNTER — Ambulatory Visit: Payer: Self-pay | Admitting: Family Medicine

## 2008-12-14 ENCOUNTER — Encounter (INDEPENDENT_AMBULATORY_CARE_PROVIDER_SITE_OTHER): Payer: Self-pay | Admitting: *Deleted

## 2008-12-16 ENCOUNTER — Ambulatory Visit: Payer: Self-pay | Admitting: Family Medicine

## 2008-12-20 ENCOUNTER — Emergency Department (HOSPITAL_COMMUNITY): Admission: EM | Admit: 2008-12-20 | Discharge: 2008-12-20 | Payer: Self-pay | Admitting: Family Medicine

## 2008-12-21 ENCOUNTER — Encounter: Payer: Self-pay | Admitting: Family Medicine

## 2008-12-21 ENCOUNTER — Ambulatory Visit: Payer: Self-pay | Admitting: Family Medicine

## 2008-12-21 LAB — CONVERTED CEMR LAB: Hgb A1c MFr Bld: 6.5 %

## 2008-12-22 LAB — CONVERTED CEMR LAB: TSH: 0.597 microintl units/mL (ref 0.350–4.500)

## 2009-01-10 ENCOUNTER — Emergency Department (HOSPITAL_COMMUNITY): Admission: EM | Admit: 2009-01-10 | Discharge: 2009-01-10 | Payer: Self-pay | Admitting: Emergency Medicine

## 2009-01-10 ENCOUNTER — Emergency Department (HOSPITAL_COMMUNITY): Admission: EM | Admit: 2009-01-10 | Discharge: 2009-01-10 | Payer: Self-pay | Admitting: Family Medicine

## 2009-01-21 ENCOUNTER — Encounter: Payer: Self-pay | Admitting: *Deleted

## 2009-01-21 ENCOUNTER — Ambulatory Visit: Payer: Self-pay | Admitting: Family Medicine

## 2009-01-26 ENCOUNTER — Telehealth: Payer: Self-pay | Admitting: Family Medicine

## 2009-02-05 ENCOUNTER — Encounter: Admission: RE | Admit: 2009-02-05 | Discharge: 2009-02-08 | Payer: Self-pay | Admitting: Sports Medicine

## 2009-03-01 ENCOUNTER — Encounter (INDEPENDENT_AMBULATORY_CARE_PROVIDER_SITE_OTHER): Payer: Self-pay | Admitting: *Deleted

## 2009-03-03 ENCOUNTER — Ambulatory Visit: Payer: Self-pay | Admitting: Family Medicine

## 2009-03-03 DIAGNOSIS — K219 Gastro-esophageal reflux disease without esophagitis: Secondary | ICD-10-CM | POA: Insufficient documentation

## 2009-03-03 DIAGNOSIS — L659 Nonscarring hair loss, unspecified: Secondary | ICD-10-CM | POA: Insufficient documentation

## 2009-03-04 ENCOUNTER — Telehealth: Payer: Self-pay | Admitting: Family Medicine

## 2009-03-11 ENCOUNTER — Telehealth: Payer: Self-pay | Admitting: Family Medicine

## 2009-03-18 ENCOUNTER — Encounter: Payer: Self-pay | Admitting: Family Medicine

## 2009-03-24 ENCOUNTER — Telehealth: Payer: Self-pay | Admitting: Family Medicine

## 2009-03-26 ENCOUNTER — Encounter: Payer: Self-pay | Admitting: Family Medicine

## 2009-04-19 ENCOUNTER — Ambulatory Visit: Payer: Self-pay | Admitting: Family Medicine

## 2009-05-15 ENCOUNTER — Emergency Department (HOSPITAL_COMMUNITY): Admission: EM | Admit: 2009-05-15 | Discharge: 2009-05-15 | Payer: Self-pay | Admitting: Emergency Medicine

## 2009-05-15 ENCOUNTER — Emergency Department (HOSPITAL_COMMUNITY): Admission: EM | Admit: 2009-05-15 | Discharge: 2009-05-15 | Payer: Self-pay | Admitting: Family Medicine

## 2009-05-19 ENCOUNTER — Ambulatory Visit: Payer: Self-pay | Admitting: Family Medicine

## 2009-06-02 ENCOUNTER — Encounter: Payer: Self-pay | Admitting: Family Medicine

## 2009-06-09 ENCOUNTER — Telehealth: Payer: Self-pay | Admitting: Family Medicine

## 2009-06-25 ENCOUNTER — Telehealth: Payer: Self-pay | Admitting: Family Medicine

## 2009-08-03 ENCOUNTER — Telehealth: Payer: Self-pay | Admitting: Family Medicine

## 2009-08-12 ENCOUNTER — Ambulatory Visit: Payer: Self-pay | Admitting: Family Medicine

## 2009-08-12 DIAGNOSIS — M549 Dorsalgia, unspecified: Secondary | ICD-10-CM

## 2009-08-12 DIAGNOSIS — L989 Disorder of the skin and subcutaneous tissue, unspecified: Secondary | ICD-10-CM | POA: Insufficient documentation

## 2009-08-12 DIAGNOSIS — G8929 Other chronic pain: Secondary | ICD-10-CM | POA: Insufficient documentation

## 2009-08-12 DIAGNOSIS — R634 Abnormal weight loss: Secondary | ICD-10-CM | POA: Insufficient documentation

## 2009-09-03 ENCOUNTER — Emergency Department (HOSPITAL_COMMUNITY): Admission: EM | Admit: 2009-09-03 | Discharge: 2009-09-03 | Payer: Self-pay | Admitting: Family Medicine

## 2009-09-13 ENCOUNTER — Telehealth: Payer: Self-pay | Admitting: Family Medicine

## 2009-09-15 ENCOUNTER — Encounter: Payer: Self-pay | Admitting: Family Medicine

## 2009-10-22 ENCOUNTER — Encounter: Payer: Self-pay | Admitting: Family Medicine

## 2009-10-24 ENCOUNTER — Emergency Department (HOSPITAL_COMMUNITY): Admission: EM | Admit: 2009-10-24 | Discharge: 2009-10-24 | Payer: Self-pay | Admitting: Emergency Medicine

## 2009-10-27 ENCOUNTER — Emergency Department (HOSPITAL_COMMUNITY): Admission: EM | Admit: 2009-10-27 | Discharge: 2009-10-28 | Payer: Self-pay | Admitting: Emergency Medicine

## 2009-10-29 ENCOUNTER — Ambulatory Visit: Payer: Self-pay | Admitting: Family Medicine

## 2009-10-29 LAB — CONVERTED CEMR LAB: Hgb A1c MFr Bld: 6.7 %

## 2009-11-03 ENCOUNTER — Encounter (HOSPITAL_COMMUNITY): Admission: RE | Admit: 2009-11-03 | Discharge: 2010-01-18 | Payer: Self-pay | Admitting: Pediatrics

## 2009-11-08 ENCOUNTER — Telehealth: Payer: Self-pay | Admitting: Family Medicine

## 2009-11-08 ENCOUNTER — Ambulatory Visit: Payer: Self-pay | Admitting: Family Medicine

## 2009-11-08 ENCOUNTER — Encounter: Payer: Self-pay | Admitting: Family Medicine

## 2009-11-16 ENCOUNTER — Encounter: Admission: RE | Admit: 2009-11-16 | Discharge: 2009-12-17 | Payer: Self-pay | Admitting: Family Medicine

## 2009-11-29 ENCOUNTER — Encounter: Payer: Self-pay | Admitting: Family Medicine

## 2009-12-01 ENCOUNTER — Ambulatory Visit (HOSPITAL_COMMUNITY): Admission: RE | Admit: 2009-12-01 | Discharge: 2009-12-01 | Payer: Self-pay | Admitting: Neurology

## 2009-12-28 ENCOUNTER — Encounter: Payer: Self-pay | Admitting: Family Medicine

## 2010-01-20 ENCOUNTER — Other Ambulatory Visit: Admission: RE | Admit: 2010-01-20 | Discharge: 2010-01-20 | Payer: Self-pay | Admitting: Family Medicine

## 2010-01-20 ENCOUNTER — Ambulatory Visit: Payer: Self-pay | Admitting: Family Medicine

## 2010-01-20 ENCOUNTER — Encounter: Payer: Self-pay | Admitting: Family Medicine

## 2010-01-20 DIAGNOSIS — N760 Acute vaginitis: Secondary | ICD-10-CM | POA: Insufficient documentation

## 2010-01-20 LAB — CONVERTED CEMR LAB
Chlamydia, DNA Probe: NEGATIVE
GC Probe Amp, Genital: NEGATIVE
Hgb A1c MFr Bld: 5.8 %
Pap Smear: NEGATIVE

## 2010-01-21 ENCOUNTER — Ambulatory Visit: Payer: Self-pay | Admitting: Family Medicine

## 2010-01-24 ENCOUNTER — Telehealth: Payer: Self-pay | Admitting: Family Medicine

## 2010-01-24 ENCOUNTER — Telehealth: Payer: Self-pay | Admitting: *Deleted

## 2010-01-31 ENCOUNTER — Encounter: Payer: Self-pay | Admitting: Family Medicine

## 2010-02-04 ENCOUNTER — Telehealth (INDEPENDENT_AMBULATORY_CARE_PROVIDER_SITE_OTHER): Payer: Self-pay | Admitting: *Deleted

## 2010-02-07 ENCOUNTER — Ambulatory Visit: Payer: Self-pay | Admitting: Family Medicine

## 2010-02-07 ENCOUNTER — Encounter: Payer: Self-pay | Admitting: Family Medicine

## 2010-02-07 LAB — CONVERTED CEMR LAB
Chlamydia, DNA Probe: NEGATIVE
GC Probe Amp, Genital: NEGATIVE

## 2010-02-16 ENCOUNTER — Encounter (INDEPENDENT_AMBULATORY_CARE_PROVIDER_SITE_OTHER): Payer: Self-pay | Admitting: *Deleted

## 2010-03-07 ENCOUNTER — Telehealth: Payer: Self-pay | Admitting: Family Medicine

## 2010-03-13 ENCOUNTER — Emergency Department (HOSPITAL_COMMUNITY)
Admission: EM | Admit: 2010-03-13 | Discharge: 2010-03-14 | Payer: Self-pay | Source: Home / Self Care | Admitting: Emergency Medicine

## 2010-03-23 ENCOUNTER — Ambulatory Visit: Admission: RE | Admit: 2010-03-23 | Discharge: 2010-03-23 | Payer: Self-pay | Source: Home / Self Care

## 2010-03-23 LAB — CONVERTED CEMR LAB: Hgb A1c MFr Bld: 6 %

## 2010-03-24 ENCOUNTER — Telehealth: Payer: Self-pay | Admitting: *Deleted

## 2010-04-10 ENCOUNTER — Encounter: Payer: Self-pay | Admitting: Obstetrics and Gynecology

## 2010-04-13 ENCOUNTER — Ambulatory Visit: Admission: RE | Admit: 2010-04-13 | Discharge: 2010-04-13 | Payer: Self-pay | Source: Home / Self Care

## 2010-04-15 ENCOUNTER — Ambulatory Visit: Admission: RE | Admit: 2010-04-15 | Discharge: 2010-04-15 | Payer: Self-pay | Source: Home / Self Care

## 2010-04-15 ENCOUNTER — Telehealth: Payer: Self-pay | Admitting: Family Medicine

## 2010-04-21 NOTE — Progress Notes (Signed)
Summary: meds prob  Medications Added CELEXA 20 MG TABS (CITALOPRAM HYDROBROMIDE) take 3 tablets daily for depression, note this is an increase in your dosage. ZOLOFT 100 MG TABS (SERTRALINE HCL) 1.5 tablets daily (per mental health) ADVAIR DISKUS 250-50 MCG/DOSE MISC (FLUTICASONE-SALMETEROL) Inhale 1 puff as directed twice a day ALBUTEROL 90 MCG/ACT AERS (ALBUTEROL) Inhale 1 puff every four to six hours AMBIEN 10 MG TABS (ZOLPIDEM TARTRATE) Take 1 tablet by mouth at bedtime [employer] ASPIRIN 81 MG CHEW (ASPIRIN) Take 1 tablet by mouth once a day B[employer]IL[employer] ASPIRIN 81 MG CHEW (ASPIRIN) Take 1 tablet by mouth once a day SYNTHROID 175 MCG  TABS (LEVOTHYROXINE SODIUM) Take 1 tablet by mouth once a day SYNTHROID 200 MCG TABS (LEVOTHYROXINE SODIUM) one by mouth daily SYNTHROID 200 MCG TABS (LEVOTHYROXINE SODIUM) one by mouth daily SYNTHROID 200 MCG TABS (LEVOTHYROXINE SODIUM) 1 tablet a day with the 25 micrograms tablet for thyroid. LEVOTHYROXINE SODIUM 25 MCG TABS (LEVOTHYROXINE SODIUM)  LEVOTHYROXINE SODIUM 25 MCG TABS (LEVOTHYROXINE SODIUM)  VALTREX 500 MG TABS (VALACYCLOVIR HCL) Take 1 tablet by mouth twice a day VALTREX 500 MG TABS (VALACYCLOVIR HCL) Take 1 tablet by mouth twice a day XANAX 0.25 MG TABS (ALPRAZOLAM) Take 1 tablet by mouth as directed XANAX 0.25 MG TABS (ALPRAZOLAM) Take 1 tablet by mouth as directed ZYRTEC HIVES RELIEF 10 MG TABS (CETIRIZINE HCL) Take 1 tablet by mouth at bedtime IBUPROFEN 800 MG TABS (IBUPROFEN) 1 tablet every 8 hours for pain IBUPROFEN 800 MG TABS (IBUPROFEN)  PREDNISONE 50 MG  TABS (PREDNISONE) take one by mouth qdaily x 3 days AUGMENTIN 875-125 MG  TABS (AMOXICILLIN-POT CLAVULANATE) 1 by mouth two times a day AUGMENTIN 875-125 MG  TABS (AMOXICILLIN-POT CLAVULANATE) 1 by mouth two times a day ALBUTEROL SULFATE (2.5 MG/3ML) 0.083% NEBU (ALBUTEROL SULFATE) inhale 3ml every 4 hours as needed for dyspnea.  Give supply for 60 doses. SUMATRIPTAN  SUCCINATE 100 MG TABS (SUMATRIPTAN SUCCINATE) one by mouth as needed migraine (after attempting ibuprofen 800mg  first) VENLAFAXINE HCL 37.5 MG TABS (VENLAFAXINE HCL) tkae 1 pill twice daily for 4 days, then increase then one in the evening until taking 3 in the morning and 3 in the evening. HYDROXYZINE HCL 50 MG TABS (HYDROXYZINE HCL) take 1 pill when you are itchy, take 1 pill every evening. CEPHALEXIN 500 MG CAPS (CEPHALEXIN) take one pill every 8 hours for 7 days.       Phone Note Call from Patient Call back at [phone]   Caller: Patient Summary of Call: pt states that she got 2 of the 4 rx's and needs the other 2 called in the 2 she needs is VENLAFAXINE and Synthorid, [facility]  Pharm states that they did not come thru. Initial call taken by: De Nurse,  October 19, 2008 4:02 PM  Follow-up for Phone Call       Follow-up by: Golden Circle RN,  October 19, 2008 4:10 PM    Prescriptions: VENLAFAXINE HCL 37.5 MG TABS (VENLAFAXINE HCL) tkae 1 pill twice daily for 4 days, then increase then one in the evening until taking 3 in the morning and 3 in the evening.  #100 x 1   Entered by:   Golden Circle RN   Authorized by:   Jamie Brookes MD   Signed by:   Golden Circle RN on 10/19/2008   Method used:   [facility]        [facility] [address] [phone]* (retail)  [address]       [place], [place]  [address]       Ph: [phone] or [phone]       Fax: [phone]   RxID:   [id number] SYNTHROID 200 MCG TABS (LEVOTHYROXINE SODIUM) one by mouth daily  #30 x 6   Entered by:   Golden Circle RN   Authorized by:   Jamie Brookes MD   Signed by:   Golden Circle RN on 10/19/2008   Method used:   Electronically to        [facility] [address] [phone]* (retail)       [address]       [place], [place]  [address]       Ph: [phone] or [phone]       Fax: [phone]   RxID:    [id number]

## 2010-04-21 NOTE — Progress Notes (Signed)
Summary: triage   Phone Note Call from Patient Call back at Home Phone [phone]   Caller: Patient Summary of Call: pt wants to have her IUD removed today - she is in great pain and wants it removed today Initial call taken by: De Nurse,  February 04, 2010 11:28 AM  Follow-up for Phone Call        advised patient that we do not have any available appointment today. she ask for appointment for Monday and time is given for work in . she states pain started yesterday . she is having vaginal spotting. no fever, nausea or vomiting. she knows pain is coming from IUD . advised her to go to urgent care if she worsens and feels she cannot wait until Monday. Follow-up by: Theresia Lo RN,  February 04, 2010 12:42 PM

## 2010-04-21 NOTE — Assessment & Plan Note (Signed)
Summary: tb test, and questions about referrals   Nurse Visit   Allergies: No Known Drug Allergies  Immunizations Administered:  PPD Skin Test:    Vaccine Type: PPD    Site: right forearm    Mfr: Evans    Dose: 0.1 ml    Route: ID    Given by: Theresia Lo RN    Exp. Date: 11/19/2011    Lot #: [id number]  Orders Added: 1)  TB Skin Test [86580] 2)  Admin 1st Vaccine [45409]    Patient in for PPD.   Patient asked to talk to doctor  or person in charge of our clinic. Explained that I need to know what her need is and what does it pertain to. After much hestitation she  states she was referred to the [facility] . She saw a doctor Dec 28 and they have given her an appointment to follow up the end of March. She  feels this is unacceptable. She needs something done for her headaches now. She wants to be referred to another neurologist that can see her sooner  , and an ENT to have her sinus checked  and she wants her hormone levels checked. As I was trying to determine her needs she said  several times  "you are not understanding , this is why I don't want to talk to you and I want to talk to someone in charge. Marland Kitchen Offered appointment with Dr. Clotilde Dieter to discuss  but she declines . Finally patient was agreeable to let me send message to Dr. Clotilde Dieter and call her back.  Will send message to Dr. Clotilde Dieter and will ask that she call patient to discuss. Phone  # [phone].    Caleigha Zale RN  April 13, 2010 2:35 PM  I [facility] to find out if the patient could be seen any sooner than 3 months. Apparently she was suppose to follow up in 2 weeks after seeing Dr. Sharene Skeans on Dec 28th so that she could get trigger point injections. She had an appointment for trigger point injections on Jan 11th but she canceled that appointment and said she would call back to reschedule. She has not as of yet called to reschedule. I am not willing to do further referrals until the  patient has followed through with the treatment that the [facility] thinks is best for her. She needs to call them at [phone] to reschedule the trigger point injections. I am not at a secure phone number today and will ask someone n the [facility] Team to relay this message. Will be happy to talk to the patient next week. Jamie Brookes MD  April 15, 2010 9:23 AM

## 2010-04-21 NOTE — Miscellaneous (Signed)
Summary: Sumatriptan refill   Clinical Lists Changes  Medications: Rx of SUMATRIPTAN SUCCINATE 100 MG TABS (SUMATRIPTAN SUCCINATE) one by mouth as needed migraine (after attempting ibuprofen 800mg  first);  #20 x 2;  Signed;  Entered by: Jamie Brookes MD;  Authorized by: Jamie Brookes MD;  Method used: [facility] [address] [facility] [address] [phone]*, [address] [place], [place], [place]  [address], Ph: [phone] or [phone], Fax: [phone]    Prescriptions: SUMATRIPTAN SUCCINATE 100 MG TABS (SUMATRIPTAN SUCCINATE) one by mouth as needed migraine (after attempting ibuprofen 800mg  first)  #20 x 2   Entered and Authorized by:   Jamie Brookes MD   Signed by:   Jamie Brookes MD on 12/28/2009   Method used:   Electronically to        [facility] [address] [phone]* (retail)       [address]       [place], [place]  [address]       Ph: [phone] or [phone]       Fax: [phone]   RxID:   [id number]

## 2010-04-21 NOTE — Assessment & Plan Note (Signed)
Summary: 2 weeks/apc   PCP:  Wilhemina Bonito  MD  Chief Complaint:  2 week mfollow up - pt feels better.  History of Present Illness: 2 wk follow up   35 year old female with known history of asthma now seen in greater than 2 years. doing well until 3 weeks ago when she developed cough congestion thick mucus, nasal drip, wheezing and tightness. Seen at urgent care given zpack no better then seen at [facility] given steroid pack and zyrtec 5 days ago. Seen in office 2 weeks ago, treated with Augmentin for 10 days.   Returns today much improved. Denies cough/wheezing or dyspnea.   May 12, Current Meds:  CELEXA 20 MG TABS (CITALOPRAM HYDROBROMIDE) take 3 tablets daily for depression, note this is an increase in your dosage. ADVAIR DISKUS 250-50 MCG/DOSE MISC (FLUTICASONE-SALMETEROL) Inhale 1 puff as directed twice a day ALBUTEROL 90 MCG/ACT AERS (ALBUTEROL) Inhale 1 puff every four to six hours AMBIEN 10 MG TABS (ZOLPIDEM TARTRATE) Take 1 tablet by mouth at bedtime SYNTHROID 175 MCG  TABS (LEVOTHYROXINE SODIUM) Take 1 tablet by mouth once a day XANAX 0.25 MG TABS (ALPRAZOLAM) Take 1 tablet by mouth as directed ZYRTEC HIVES RELIEF 10 MG TABS (CETIRIZINE HCL) Take 1 tablet by mouth at bedtime IBUPROFEN 800 MG TABS (IBUPROFEN) 1 tablet every 8 hours for pain       Current Allergies (reviewed today): No known allergies       Vital Signs:  Patient Profile:   35 Years Old Female Weight:      193.50 pounds O2 Sat:      98 % O2 treatment:    Room Air Temp:     98.4 degrees F oral Pulse rate:   67 / minute BP sitting:   116 / 66  (left arm) Cuff size:   regular  Vitals Entered By: Boone Master CNA (Jul 30, 2007 3:33 PM)             Is Patient Diabetic? No Comments Medications reviewed with patient Boone Master CNA  Jul 30, 2007 3:33 PM      Physical Exam  Pt is in no acute distress. Vital signs stable.  HEENT: Nasal mucosa erythematous with mild edema.  Ear  canals are clear bilaterally. nontender sinus.  NECK: Supple without cervical adenopathy or tenderness. Trachea is midline without lymphadenopathy. LUNGS: Lung fields are clear bilaterally. HEART: Regular rate and rhythm without murmur, gallop or rub. ABDOMEN: Soft, benign. EXTREMITIES: Warm without calf tenderness, cyanosis, clubbing or edema.       Impression & Recommendations:  Problem # 1:  ASTHMATIC BRONCHITIS, ACUTE (ICD-466.0) Resolved flare. Much improved.  Continue on present regimen.  The following medications were removed from the medication list:    Augmentin 875-125 Mg Tabs (Amoxicillin-pot clavulanate) .Marland Kitchen... 1 by mouth two times a day  Her updated medication list for this problem includes:    Advair Diskus 250-50 Mcg/dose Misc (Fluticasone-salmeterol) ..... Inhale 1 puff as directed twice a day    Albuterol 90 Mcg/act Aers (Albuterol) ..... Inhale 1 puff every four to six hours  Orders: Est. Patient Level II ([address])    Patient Instructions: 1)  Remain on present regimen.  2)  follow up 1 month Dr. Sherene Sires  3)  Please contact office for sooner follow up if symptoms do not improve or worsen    ]

## 2010-04-21 NOTE — Progress Notes (Addendum)
Summary: pt to call [facility]ace [facility]   ---- Converted from flag ---- ---- 04/15/2010 9:27 AM, Jamie Brookes MD wrote: Sorry to ask you to do this, but I am not at a secure phone number today (cell phone only). Please read my note about calling the [facility]. She needs to call them at [phone]  to make a trigger point injection. She should make an appointment to discuss sinus issues and hormones if she wants to discuss those. Thanks. ------------------------------  Phone Note Outgoing Call   Call placed by: Loralee Pacas CMA,  April 15, 2010 10:17 AM Summary of Call: have pt to call [facility]

## 2010-04-21 NOTE — Consult Note (Signed)
Summary: [facility]   Imported By: De Nurse 02/04/2010 16:18:13  _____________________________________________________________________  External Attachment:    Type:   Image     Comment:   External Document

## 2010-04-21 NOTE — Letter (Signed)
Summary: New Patient letter  [facility]  [address] [place], [place] [address]   Phone: [phone]  Fax: [phone]       03/01/2009 MRN: [id number]  Jade Mathis [address] [place], [place]  [address]  Dear Jade Mathis,  Welcome to the Gastroenterology Division at [facility].    You are scheduled to see Dr. Russella Dar on 03-31-09 at 3:30p.m. on the 3rd floor at [facility], [address]  We ask that you try to arrive at our office 15 minutes prior to your appointment time to allow for check-in.  We would like you to complete the enclosed self-administered evaluation form prior to your visit and bring it with you on the day of your appointment.  We will review it with you.  Also, please bring a complete list of all your medications or, if you prefer, bring the medication bottles and we will list them.  Please bring your insurance card so that we may make a copy of it.  If your insurance requires a referral to see a specialist, please bring your referral form from your primary care physician.  Co-payments are due at the time of your visit and may be paid by cash, check or credit card.     Your office visit will consist of a consult with your physician (includes a physical exam), any laboratory testing he/she may order, scheduling of any necessary diagnostic testing (e.g. x-ray, ultrasound, CT-scan), and scheduling of a procedure (e.g. Endoscopy, Colonoscopy) if required.  Please allow enough time on your schedule to allow for any/all of these possibilities.    If you cannot keep your appointment, please call [phone] to cancel or reschedule prior to your appointment date.  This allows [place] the opportunity to schedule an appointment for another patient in need of care.  If you do not cancel or reschedule by 5 p.m. the business day prior to your appointment date, you will be charged a $50.00 late cancellation/no-show fee.    Thank you for choosing [facility] for your medical needs.  We appreciate the opportunity to care for you.  Please visit [place] at our website  to learn more about our practice.                     Sincerely,                                                             The Gastroenterology Division

## 2010-04-21 NOTE — Assessment & Plan Note (Signed)
Summary: 250, 278 / [name]   Vital Signs:  Patient profile:   35 year old female Height:      66.5 inches Weight:      213.5 pounds BMI:     34.07  Vitals Entered By: Wyona Almas PHD (April 19, 2009 2:23 PM)  Primary Care Provider:  Jamie Brookes MD   History of Present Illness: Assessment: Spent 60 min with pt.  Usual eating pattern includes 3 meals and a morning and afternoon snack.  Ms. Benn started 2 wk ago doing cardio ex at the [facility] 3 X wk.  She is also participating in a contest at her son's school in which she logs daily steps on a pedometer until April.  (She was at >5,000 today by 2:30 PM.)  24-hr recall suggests kcal intake of  ~1500: B (10 AM)- [employer] maple syrup oatmeal w/ apples & raisins, 1 c green tea w/ Splenda; Snk (12:30 PM)- [employer] almond nut bar, water; L (3 PM)- tuna salad sandwich on 50-kcal wholewheat, 1 apple, water; Snk (6:50 PM)-fruit salad w/ banana, f-f sourcream, walnuts, mandarin oranges, marshmallows, crushed pineapple; D (7:15 PM)- small salad w/ olive oil & vin & light ranch, chicken leg w/ skin, 3/4 c rice, [place] bread, water.  Out of the ordinary yesterday was the fruit salad and rice.  Ms. Fahey does not check BG levels; said her A1C went up (to 6.5 from 5.8) last time b/c she'd been eating sweets, which she has since stopped.            Nutrition Diagnosis: Inadequate fiber intake (NI-53.5) related to insufficient veg intake as evidenced by only 1 small salad yesterday and frequent constipation.    Intervention: See Patient Instructions  Monitoring/Eval: Dietary intake, body weight, and exercise at 4-wk F/U.     Allergies: No Known Drug Allergies   Complete Medication List: 1)  Advair Diskus 250-50 Mcg/dose Misc (Fluticasone-salmeterol) .... Inhale 1 puff as directed twice a day 2)  Albuterol 90 Mcg/act Aers (Albuterol) .... Inhale 1 puff every four to six hours 3)  Ambien 10 Mg Tabs (Zolpidem tartrate) .... Take 1 tablet by mouth at  bedtime 4)  Synthroid 200 Mcg Tabs (Levothyroxine sodium) .... One by mouth daily 5)  Zyrtec Hives Relief 10 Mg Tabs (Cetirizine hcl) .... Take 1 tablet by mouth a day 6)  Ibuprofen 800 Mg Tabs (Ibuprofen) .Marland Kitchen.. 1 tablet every 8 hours for pain 7)  Albuterol Sulfate (2.5 Mg/30ml) 0.083% Nebu (Albuterol sulfate) .... Inhale 3ml every 4 hours as needed for dyspnea.  give supply for 60 doses. 8)  Sumatriptan Succinate 100 Mg Tabs (Sumatriptan succinate) .... One by mouth as needed migraine (after attempting ibuprofen 800mg  first) 9)  Venlafaxine Hcl 225 Mg Xr24h-tab (Venlafaxine hcl) .... Take one tablet daily 10)  Hydroxyzine Hcl 50 Mg Tabs (Hydroxyzine hcl) .... Take 1 pill when you are itchy, take 1 pill every evening. 11)  Cephalexin 500 Mg Caps (Cephalexin) .... Take one pill every 8 hours for 7 days. 12)  Flexeril 10 Mg Tabs (Cyclobenzaprine hcl) .... Take one tablet 3 times a day for muscle spasms 13)  Clobetasol Propionate 0.05 % Crea (Clobetasol propionate) .... Use on hair loss area twice daily 14)  Omeprazole 20 Mg Cpdr (Omeprazole) .... Take one cp daily 15)  Miralax Powd (Polyethylene glycol 3350) .... Take one capful daily. 16)  Xenical 120 Mg Caps (Orlistat) .... Take one tablet when eating oily foods. 17)  Trazodone Hcl 50 Mg  Tabs (Trazodone hcl) .... Take on pill 15 minutes before you are ready to go to bed.  Other Orders: Inital Assessment Each - [facility] [id number])  Patient Instructions: 1)  Congratulations [facility] your [facility]  Keep it up, and look for exercise opportunities during your day.     2)  No more than 5 hours between eating.  3)  Obtain twice as many veg's as protein or carbohydrate foods for both lunch and dinner.  4)  PLAN AHEAD for meals and snacks.   5)  Regular cooked oatmeal is better for your blood sugar.  (The best & least expensive source of oats is organic thick rolled oats IN BULK from [employer] or [employer].)   6)  Protein bars:  Look  for ones with least sugar and at least 8 grams of protein.  This may be the most satisfying to you in terms of appetite.  If it's not long till your next meal, a piece of fruit would be even better.   7)  Document when you wake up at night hungry, and write down what you had to eat and when, the previous afternoon and evening.   8)  Post-exercise drink instead of Ensure:  Fat-free milk with [place].  This is for when you exercise at least 45 min or an hour or more.    9)  Follow-up appt:  Mar 7 at 3:30.   10)  Call Dr. Gerilyn Pilgrim if any Qs:  [phone].

## 2010-04-21 NOTE — Assessment & Plan Note (Signed)
Summary: leg cramping, headache/ls    Vital Signs:  Patient Profile:   35 Years Old Female Weight:      187 pounds Temp:     99 degrees F Pulse rate:   88 / minute BP sitting:   116 / 77  Pt. in pain?   yes    Location:   legs,arms,head    Intensity:   8  Vitals Entered By: Golden Circle RN (July 05, 2007 8:33 AM)                  PCP:  Wilhemina Bonito  MD  Chief Complaint:  continued pain.  History of Present Illness: S: Patient is a 35 y/o female with myasthenia gravis and depression and DM here today for pain all over. she was seen two days ago in clinic and diagnosed with URI type illness. Patient very angry that she had to wait a long time and that she didnt get medicine for her pain.   1. leg cramps- patient having pain in her anterior lower legs. She has had similar pain before.Pain described as a "dull sharp" pain No pain in calves or upper legs. Patient thinks the pain was triggered by he weather change. She is taking high dose iboprofen for this pain.  2. depression- patient has been on Celexa for a year 3. headache- not a new or more severe type headache for her. It is frontal and throbbing. No associated visual symptoms, vomiting, or weakness. No teeth pain.  4. URI- patient not SOB. No fevers    Current Allergies: No known allergies   Past Medical History:    Reviewed history from 05/17/2006 and no changes required:       03/06 TSH 0.998 WNL, Completed HBV vaccination series 8/05, DM dx`d in 1997, controlled w/ diet and exercise, G3P2012 (SAB);  2 NSVD, h/o graves dz, now iatrogenic hypothryroidism, iatrogenic recurrent laryngeal n inury, no h/o abnl PAP, Peak Flow baseline= 240-270, takes Mestinon (pyridostigmine) q4 for myasthenia   Family History:    Reviewed history from 05/17/2006 and no changes required:       Diabetes 1st degree, Father has first MI at 71 yo, HTN, Husband has HBV, sister has SLE  Social History:    Reviewed history from 05/17/2006  and no changes required:       Lives with husband and 2 children.  Unemployed.  Has received disability  secondary to myasthenia.  No tobacco, EtOH, recreational drugs.  Home is safe environment.  Currently completing certificate for nursing aide.  Hopes to seek employment as HHRN.  Understands that I will not renew her disability- initially obtained with Dr. Sharene Skeans.     Physical Exam  General:     alert, well-developed, well-nourished, and well-hydrated.  not ill-appearing Mouth:     Oral mucosa and oropharynx without lesions or exudates.  Lungs:     Normal respiratory effort, chest expands symmetrically. Lungs are clear to auscultation, no crackles or wheezes. Heart:     Normal rate and regular rhythm. S1 and S2 normal without gallop, murmur, click, rub or other extra sounds. Msk:     no pain with tap test on tibia. hop test neg Extremities:     no calf asymmetry. Homans negative. no cords.  No swelling.     Impression & Recommendations:  Problem # 1:  LEG CRAMPS (ICD-729.82) Assessment: New No signs, risk factors or symptoms of serious pathology such as DVT or limb ischemia. Body aches are  also likely tied in to her depressive symptoms . Patient may do well on a med like Cymbalta of she continues to have chronic pain issues. For now will treat with Tramadol and Flexeril which have worked for her in the past. She understands that this is a temporary measures and she should return to clinic if pain perissts. Orders: Basic Met-[facility] ([address]-60454) CBC-[facility] (09811) [facility]- Est Level  3 (91478)   Complete Medication List: 1)  Celexa 20 Mg Tabs (Citalopram hydrobromide) .... Take 3 tablets daily for depression, note this is an increase in your dosage. 2)  Advair Diskus 250-50 Mcg/dose Misc (Fluticasone-salmeterol) .... Inhale 1 puff as directed twice a day 3)  Albuterol 90 Mcg/act Aers (Albuterol) .... Inhale 1 puff every four to six hours 4)  Ambien 10 Mg Tabs (Zolpidem tartrate) ....  Take 1 tablet by mouth at bedtime 5)  Bayer Childrens Aspirin 81 Mg Chew (Aspirin) .... Take 1 tablet by mouth once a day 6)  Synthroid 200 Mcg Tabs (Levothyroxine sodium) .Marland Kitchen.. 1 tablet a day with the 25 micrograms tablet for thyroid. 7)  Levothyroxine Sodium 25 Mcg Tabs (Levothyroxine sodium) 8)  Valtrex 500 Mg Tabs (Valacyclovir hcl) .... Take 1 tablet by mouth twice a day 9)  Xanax 0.25 Mg Tabs (Alprazolam) .... Take 1 tablet by mouth as directed 10)  Zyrtec Hives Relief 10 Mg Tabs (Cetirizine hcl) .... Take 1 tablet by mouth at bedtime 11)  Ibuprofen 800 Mg Tabs (Ibuprofen) .Marland Kitchen.. 1 tablet every 8 hours for pain   Patient Instructions: 1)  for your leg cramps, start drinking an extra 24 ounces of water daily 2)  tramadol and flexeril can be used for added pain control (these are very addictive and should only be used for a short period of time and do not drive after taking these() 3)  also try using a heating pad  4)  for your congestion try an OTC decongestant with the ingredient (pseudo ephedrine)   5)  please schedule appt with Dr. Sandria Manly reguarding your antidepressant medication    ]  Vital Signs:  Patient Profile:   35 Years Old Female Weight:      187 pounds Temp:     99 degrees F Pulse rate:   88 / minute BP sitting:   116 / 77    Location:   legs,arms,head    Intensity:   8

## 2010-04-21 NOTE — Progress Notes (Signed)
Summary: Triage   Phone Note Call from Patient Call back at [phone]   Reason for Call: Talk to Nurse Summary of Call: pt is requesting to speak with RN, sts she is not feeling any better from the other day and thinks Dr. Sandria Manly needs to be paged. Pt also has a headache. Initial call taken by: Knox Royalty,  July 04, 2007 2:16 PM  Follow-up for Phone Call        pt reports breathing has improved but she continues with much discomfort in legs, having leg cramps, also discomfort in arms and headache .states MD told her on 07/02/07 that he thinks she has flu. states she had waited 2 hours to be seen and  is upset that medication was not given for leg and arm discomfort. RN advised that from note there is not mention of these complaints but she states she told MD about this. she has been taking ibuprofen without relief .  appointment scheuled tomorrow with Dr. Iven Finn. pt  states she wants to come at a scheduled early appointment so she doesn't have to wait. Follow-up by: Theresia Lo RN,  July 04, 2007 2:36 PM         Appended Document: Triage pt is requesting to speak with Larita Fife again, sts she doesn't think she can wait until the morning, sts she is in severe pain. pt sts if we can't see her today what else can she take over the counter?  Appended Document: Triage spoke with patient and explained that since she is taking ibuprofen 800 mg there is nothing stronger OTC she can take. advised that if she is having that much pain and cannot wait until tomorrow to go to urgent care. she states she will wait for appointment tomorrow. denies any warmth,  redness or swelling. states  she is having spasms of legs and has had this for a while.

## 2010-04-21 NOTE — Progress Notes (Signed)
Summary: Mix up meds  Medications Added IBUPROFEN 800 MG TABS (IBUPROFEN) 1 tablet every 8 hours for pain      Allergies Added: NKDA Phone Note Call from Patient Call back at [phone]   Summary of Call: Patient states there was a mix up with her medications yesterday at her appt and would like to speak with someone asap about it. Initial call taken by: Haydee Salter,  November 01, 2006 8:47 AM  Follow-up for Phone Call        pt states  pharmacist gave her the prescription for Synthroid  200 micrograms. does not have prescription for 25 micrograms Synthroid. also pharmacy does not have rx for ibuprofen or ambein . verified with pharmacy that these were not received. will send message to Dr.  Sandria Manly. Follow-up by: Theresia Lo RN,  November 01, 2006 9:00 AM    New/Updated Medications: IBUPROFEN 800 MG TABS (IBUPROFEN) 1 tablet every 8 hours for pain   Prescriptions: AMBIEN 10 MG TABS (ZOLPIDEM TARTRATE) Take 1 tablet by mouth at bedtime  #20 x 0   Entered and Authorized by:   Wilhemina Bonito  MD   Signed by:   Wilhemina Bonito  MD on 11/01/2006   Method used:   Print then Give to Patient   RxID:   [id number] IBUPROFEN 800 MG TABS (IBUPROFEN) 1 tablet every 8 hours for pain  #60 x 1   Entered and Authorized by:   Wilhemina Bonito  MD   Signed by:   Wilhemina Bonito  MD on 11/01/2006   Method used:   Print then Give to Patient   RxID:   [id number]  [facility]  stay on a day of synthroid.  thyroid studies reviewed.

## 2010-04-21 NOTE — Assessment & Plan Note (Signed)
Summary: f/u/ tlb   Vital Signs:  Patient profile:   35 year old female Weight:      213.2 pounds Temp:     98.4 degrees F oral Pulse rate:   90 / minute Pulse rhythm:   regular BP sitting:   122 / 86  (left arm) Cuff size:   large  Vitals Entered By: Loralee Pacas CMA (October 29, 2009 2:54 PM) CC: HA, DM, Headache Is Patient Diabetic? Yes Comments pt is having more frequent migraines medication that was given not as effective   Primary Care Provider:  Jamie Brookes MD  CC:  HA, DM, and Headache.  History of Present Illness: Headaches: Pt is having continued migraines. Left sided headaches, vision changes include double and blurry vision, happening 2x.wk, no auditory problems, + light sensitivity. Migraine meds work sometimes but not all the time. Has been on Topamax in the past and wants to try it again.   DM: Pt has been told that she has DM but she has never gotten a meter. Discussed getting a meter through Medicare and if not then getting a meter at [employer]. Will try to get her a meter. Has not gone to DM edcuation classes but does know how to use a meter b/c she use to have to give herself insulin during her pregnancies x2 (GDM).  Current Medications (verified): 1)  Advair Diskus 250-50 Mcg/dose Misc (Fluticasone-Salmeterol) .... Inhale 1 Puff As Directed Twice A Day 2)  Albuterol 90 Mcg/act Aers (Albuterol) .... Inhale 1 Puff Every Four To Six Hours 3)  Synthroid 200 Mcg Tabs (Levothyroxine Sodium) .... One By Mouth Daily 4)  Zyrtec Hives Relief 10 Mg Tabs (Cetirizine Hcl) .... Take 1 Tablet By Mouth A Day 5)  Ibuprofen 800 Mg Tabs (Ibuprofen) .Marland Kitchen.. 1 Tablet Every 8 Hours For Pain 6)  Albuterol Sulfate (2.5 Mg/21ml) 0.083% Nebu (Albuterol Sulfate) .... Inhale 3ml Every 4 Hours As Needed For Dyspnea.  Give Supply For 60 Doses. 7)  Sumatriptan Succinate 100 Mg Tabs (Sumatriptan Succinate) .... One By Mouth As Needed Migraine (After Attempting Ibuprofen 800mg  First) 8)   Effexor Xr 150 Mg Xr24h-Cap (Venlafaxine Hcl) .... Take 1 Tablet By Mouth Qam W/ Food 9)  Flexeril 10 Mg Tabs (Cyclobenzaprine Hcl) .... Take One Tablet 3 Times A Day For Muscle Spasms 10)  Clobetasol Propionate 0.05 % Crea (Clobetasol Propionate) .... Use On Hair Loss Area Twice Daily 11)  Omeprazole 20 Mg Cpdr (Omeprazole) .... Take One Cp Daily 12)  Miralax  Powd (Polyethylene Glycol 3350) .... Take One Capful Daily. 13)  Trazodone Hcl 50 Mg Tabs (Trazodone Hcl) .... Take On Pill 15 Minutes Before You Are Ready To Go To Bed. 14)  Metoclopramide Hcl 10 Mg Tabs (Metoclopramide Hcl) .... Take 1 Tablet About 30 Minutes Before Meals 15)  Promethazine Hcl 25 Mg Tabs (Promethazine Hcl) .... Take 1 Pill At Night For Nausea (Do Not Drive While Taking This Medicine) 16)  Topiramate 25 Mg Tabs (Topiramate) .... Take 1 Pill Daily To Prevent Migraines For 1 Week, Then Increase To 2 Pills Daily For 1 Weeks, Then 2 Pills Every Morning and Every Evening 17)  Onetouch Test  Strp (Glucose Blood) .... Testing 1 Time A Day 18)  Lancets  Misc (Lancets) .... Testing 1 Time A Day 19)  Onetouch Ultra System W/device Kit (Blood Glucose Monitoring Suppl) .... Testing 1 Time A Day For Diabetes Type 2 Without Complications 250.0  Allergies (verified): No Known Drug Allergies  Review of  Systems        vitals reviewed and pertinent negatives and positives seen in HPI   Physical Exam  General:  Well-developed,well-nourished,in no acute distress; alert,appropriate and cooperative throughout examination Eyes:  No corneal or conjunctival inflammation noted. EOMI. Perrla. Funduscopic exam benign, without hemorrhages, exudates or papilledema. Vision grossly normal. Lungs:  Normal respiratory effort, chest expands symmetrically. Lungs are clear to auscultation, no crackles or wheezes. Heart:  Normal rate and regular rhythm. S1 and S2 normal without gallop, murmur, click, rub or other extra sounds.   Impression &  Recommendations:  Problem # 1:  MIGRAINE HEADACHE (ICD-346.90) Assessment Deteriorated Restarted Topamax today for migraine prophy. She has been on this before.   Her updated medication list for this problem includes:    Ibuprofen 800 Mg Tabs (Ibuprofen) .Marland Kitchen... 1 tablet every 8 hours for pain    Sumatriptan Succinate 100 Mg Tabs (Sumatriptan succinate) ..... One by mouth as needed migraine (after attempting ibuprofen 800mg  first)  Orders: [facility]- Est Level  3 (16109)  Problem # 2:  DIABETES MELLITUS II, UNCOMPLICATED (ICD-250.00)  A1c is 6.7 today. It has been slowly creeping up for several months. Plan to start Metformin today. Will also need DM education class again.   Orders: A1C-[facility] (60454) [facility]- Est Level  3 (09811) Diabetic Clinic Referral (Diabetic)  Her updated medication list for this problem includes:    Metformin Hcl 500 Mg Tabs (Metformin hcl) .Marland Kitchen... Take one pill every evening for 1 week, then increase it to 1 in the am and 1 in the pm for 2 weeks, then 2 two times a day after that.  Complete Medication List: 1)  Advair Diskus 250-50 Mcg/dose Misc (Fluticasone-salmeterol) .... Inhale 1 puff as directed twice a day 2)  Albuterol 90 Mcg/act Aers (Albuterol) .... Inhale 1 puff every four to six hours 3)  Synthroid 200 Mcg Tabs (Levothyroxine sodium) .... One by mouth daily 4)  Zyrtec Hives Relief 10 Mg Tabs (Cetirizine hcl) .... Take 1 tablet by mouth a day 5)  Ibuprofen 800 Mg Tabs (Ibuprofen) .Marland Kitchen.. 1 tablet every 8 hours for pain 6)  Albuterol Sulfate (2.5 Mg/44ml) 0.083% Nebu (Albuterol sulfate) .... Inhale 3ml every 4 hours as needed for dyspnea.  give supply for 60 doses. 7)  Sumatriptan Succinate 100 Mg Tabs (Sumatriptan succinate) .... One by mouth as needed migraine (after attempting ibuprofen 800mg  first) 8)  Effexor Xr 150 Mg Xr24h-cap (Venlafaxine hcl) .... Take 1 tablet by mouth qam w/ food 9)  Flexeril 10 Mg Tabs (Cyclobenzaprine hcl) .... Take one tablet 3 times a  day for muscle spasms 10)  Clobetasol Propionate 0.05 % Crea (Clobetasol propionate) .... Use on hair loss area twice daily 11)  Omeprazole 20 Mg Cpdr (Omeprazole) .... Take one cp daily 12)  Miralax Powd (Polyethylene glycol 3350) .... Take one capful daily. 13)  Trazodone Hcl 50 Mg Tabs (Trazodone hcl) .... Take on pill 15 minutes before you are ready to go to bed. 14)  Metoclopramide Hcl 10 Mg Tabs (Metoclopramide hcl) .... Take 1 tablet about 30 minutes before meals 15)  Promethazine Hcl 25 Mg Tabs (Promethazine hcl) .... Take 1 pill at night for nausea (do not drive while taking this medicine) 16)  Topiramate 25 Mg Tabs (Topiramate) .... Take 1 pill daily to prevent migraines for 1 week, then increase to 2 pills daily for 1 weeks, then 2 pills every morning and every evening 17)  Onetouch Test Strp (Glucose blood) .... Testing 1 time a  day 18)  Lancets Misc (Lancets) .... Testing 1 time a day 19)  Onetouch Ultra System W/device Kit (Blood glucose monitoring suppl) .... Testing 1 time a day for diabetes type 2 without complications 250.0 20)  Metformin Hcl 500 Mg Tabs (Metformin hcl) .... Take one pill every evening for 1 week, then increase it to 1 in the am and 1 in the pm for 2 weeks, then 2 two times a day after that. Prescriptions: METFORMIN HCL 500 MG TABS (METFORMIN HCL) take one pill every evening for 1 week, then increase it to 1 in the am and 1 in the pm for 2 weeks, then 2 two times a day after that.  #120 x 1   Entered and Authorized by:   Jamie Brookes MD   Signed by:   Jamie Brookes MD on 10/31/2009   Method used:   [facility]        [facility] [address] [phone]* (retail)       [address]       [place], [place]  [address]       Ph: [phone] or [phone]       Fax: [phone]   RxID:   [id number] [facility] ULTRA SYSTEM W/DEVICE KIT (BLOOD GLUCOSE MONITORING SUPPL) testing 1 time a day for Diabetes Type 2 without complications 250.0   #100 x 11   Entered and Authorized by:   Jamie Brookes MD   Signed by:   Jamie Brookes MD on 10/29/2009   Method used:   [facility]        [facility] [address] [phone]* (retail)       [address]       [place], [place]  [address]       Ph: [phone] or [phone]       Fax: [phone]   RxID:   [id number] LANCETS  MISC (LANCETS) testing 1 time a day  #100 x 11   Entered and Authorized by:   Jamie Brookes MD   Signed by:   Jamie Brookes MD on 10/29/2009   Method used:   [facility]        [facility] [address] [phone]* (retail)       [address]       [place], [place]  [address]       Ph: [phone] or [phone]       Fax: [phone]   RxID:   [id number] ONETOUCH TEST  STRP (GLUCOSE BLOOD) testing 1 time a day  #100 x 11   Entered and Authorized by:   Jamie Brookes MD   Signed by:   Jamie Brookes MD on 10/29/2009   Method used:   [facility]        [facility] [address] [phone]* (retail)       [address]       [place], [place]  [address]       Ph: [phone] or [phone]       Fax: [phone]   RxID:   [id number] TOPIRAMATE 25 MG TABS (TOPIRAMATE) take 1 pill daily to prevent migraines for 1 week, then increase to 2 pills daily for 1 weeks, then 2 pills every morning and every evening  #120 x 3   Entered and Authorized by:  Jamie Brookes MD   Signed by:   Jamie Brookes MD on 10/29/2009   Method used:   [facility]        [facility] [address] [phone]* (retail)       [address]       [place], [place]  [address]       Ph: [phone] or [phone]       Fax: [phone]   RxID:   [id number] SYNTHROID 200 MCG TABS (LEVOTHYROXINE SODIUM) one by mouth daily  #31 x 3   Entered and Authorized by:   Jamie Brookes MD   Signed by:   Jamie Brookes MD on 10/29/2009   Method used:   [facility]        [facility] [address] [phone]* (retail)       [address]       [place], [place]  [address]       Ph: [phone] or [phone]       Fax: [phone]   RxID:   [id number] FLEXERIL 10 MG TABS (CYCLOBENZAPRINE HCL) take one tablet 3 times a day for muscle spasms  #90 x 3   Entered and Authorized by:   Jamie Brookes MD   Signed by:   Jamie Brookes MD on 10/29/2009   Method used:   [facility]        [facility] [address] [phone]* (retail)       [address]       [place], [place]  [address]       Ph: [phone] or [phone]       Fax: [phone]   RxID:   [id number] OMEPRAZOLE 20 MG CPDR (OMEPRAZOLE) take one cp daily  #31 x 11   Entered and Authorized by:   Jamie Brookes MD   Signed by:   Jamie Brookes MD on 10/29/2009   Method used:   [facility]        [facility] [address] [phone]* (retail)       [address]       [place], [place]  [address]       Ph: [phone] or [phone]       Fax: [phone]   RxID:   [id number]   Laboratory Results   Blood Tests   Date/Time Received: October 29, 2009 3:46 PM  Date/Time Reported: October 29, 2009 4:23 PM   HGBA1C: 6.7%   (Normal Range: Non-Diabetic - 3-6%   Control Diabetic - 6-8%)  Comments: ...............test performed by......Marland KitchenBonnie A. [place], MLS (ASCP)cm

## 2010-04-21 NOTE — Progress Notes (Signed)
Summary: Rx's from last appt  Phone Note Call from Patient Call back at [phone]   Reason for Call: Talk to Nurse Summary of Call: pt is requesting rx's from her last appt - states they were supposed to be called in and never were Initial call taken by: Haydee Salter,  September 10, 2006 10:33 AM  Follow-up for Phone Call        pt is calling again checking status of Rxs, [facility], pt is requesting to speak with RN to make sure the right meds are called in Follow-up by: ERIN LEVAN,  September 10, 2006 2:35 PM  Additional Follow-up for Phone Call Additional follow up Details #1::        left message. ( doxy was sent to pharmacy 08/30/06) Additional Follow-up by: Golden Circle RN,  September 10, 2006 3:06 PM   Additional Follow-up for Phone Call Additional follow up Details #2::    pt is calling back, she sts we are calling in the wrong medication and thats why she needs to speak with RN Follow-up by: Denny Peon LEVAN,  September 10, 2006 3:47 PM  Additional Follow-up for Phone Call Additional follow up Details #3:: Details for Additional Follow-up Action Taken: not on prozac, takes celexa. States she told the md the wrong drug.  verified with pharmacist & refilled. deleted prozac. verified doxy was there as was levo. message to md to verify dose of levo. pt notified Additional Follow-up by: Golden Circle RN,  September 10, 2006 4:22 PM

## 2010-04-21 NOTE — Assessment & Plan Note (Signed)
Summary: diabetes check & bronchitis f/u   Vital Signs:  Patient Profile:   35 Years Old Female Weight:      191 pounds Pulse rate:   80 / minute BP sitting:   100 / 67  Vitals Entered By: Lillia Pauls CMA (August 30, 2006 3:32 PM)               Chief Complaint:  diabetes check & bronchitis f/u.  History of Present Illness: Pt has gained weight since last visit, 10lbs.  Pt is on synthroid.  needing to have thyroid rechecked and then needing refills.  pt interested in starting weight watchers or xenical.  pt;s BMI is 29.9.  goal is 25.    pt is going to [place] in october and needing vaccines for yellow fever and malaria.  pt 's husband is native to area.    pt also here for followup of bronchitis.  no new fevers, doing better but still with persistent cough.  needing refill on tussinex.     Past Medical History:    Reviewed history from 05/17/2006 and no changes required:       03/06 TSH 0.998 WNL, Completed HBV vaccination series 8/05, DM dx`d in 1997, controlled w/ diet and exercise, G3P2012 (SAB);  2 NSVD, h/o graves dz, now iatrogenic hypothryroidism, iatrogenic recurrent laryngeal n inury, no h/o abnl PAP, Peak Flow baseline= 240-270, takes Mestinon (pyridostigmine) q4 for myasthenia   Family History:    Reviewed history from 05/17/2006 and no changes required:       Diabetes 1st degree, Father has first MI at 77 yo, HTN, Husband has HBV, sister has SLE  Social History:    Reviewed history from 05/17/2006 and no changes required:       Lives with husband and 2 children.  Unemployed.  Has received disability  secondary to myasthenia.  No tobacco, EtOH, recreational drugs.  Home is safe environment.  Currently completing certificate for nursing aide.  Hopes to seek employment as HHRN.  Understands that I will not renew her disability- initially obtained with Dr. Sharene Skeans.     Physical Exam  General:     Well-developed,well-nourished,in no acute distress;  alert,appropriate and cooperative throughout examination Head:     Normocephalic and atraumatic without obvious abnormalities. No apparent alopecia or balding. Eyes:     No corneal or conjunctival inflammation noted. EOMI. Perrla.  Ears:     External ear exam shows no significant lesions or deformities.  Otoscopic examination reveals clear canals, tympanic membranes are intact bilaterally without bulging, retraction, inflammation or discharge. Hearing is grossly normal bilaterally. Nose:     External nasal examination shows no deformity or inflammation. Nasal mucosa are pink and moist without lesions or exudates. Mouth:     Oral mucosa and oropharynx without lesions or exudates.  Teeth in good repair. Neck:     No deformities, masses, or tenderness noted. Lungs:     Normal respiratory effort, chest expands symmetrically. Lungs are clear to auscultation, no crackles or wheezes. Heart:     Normal rate and regular rhythm. S1 and S2 normal without gallop, murmur, click, rub or other extra sounds.    Impression & Recommendations:  Problem # 1:  OBESITY, NOS (ICD-278.00) Assessment: Deteriorated will give pt information for weight watchers and also write prescription for xenical, however, I doubt medicaid will pay for it.  encouraged the weight watchers.   Orders: [facility]- Est  Level 4 (44010)   Problem # 2:  DIABETES MELLITUS  II, UNCOMPLICATED (ICD-250.00) no HbA1c today since diet controlled.   Orders: [facility]- Est  Level 4 ([address])   Problem # 3:  HYPOTHYROIDISM, UNSPECIFIED (ICD-244.9) will recheck thyroid and then adjust meds accordingly.   Orders: [facility]- Est  Level 4 ([address]) Free T4-[facility] [phone]) Free T3-[facility] [phone]) TSH-[facility] [phone])   Problem # 4:  COUNSELING , OTHER, NEC (ICD-V65.49) discussed and gathered information on traveling to [place] in october to [place] with husband and two children 1, 4yo.  needs vaccines and given handout and instructd to go to health dept  for them.  also given rx for malaria proph of doxycycline 100mg  by mouth wday take 2 days prior to leaving and then continue for 4 weeks after returning.  >45 min.

## 2010-04-21 NOTE — Miscellaneous (Signed)
Summary: DNKA/BP/sma   Clinical Lists Changes

## 2010-04-21 NOTE — Consult Note (Signed)
Summary: [facility]   Imported By: Clydell Hakim 10/21/2009 15:45:40  _____________________________________________________________________  External Attachment:    Type:   Image     Comment:   External Document

## 2010-04-21 NOTE — Consult Note (Signed)
Summary: [facility]: MG return in 3 months  [facility]   Imported By: De Nurse 11/30/2009 16:16:02  _____________________________________________________________________  External Attachment:    Type:   Image     Comment:   External Document

## 2010-04-21 NOTE — Progress Notes (Signed)
Summary: needs PA   Phone Note Refill Request Call back at [phone] Message from:  Patient  Refills Requested: Medication #1:  XENICAL 120 MG CAPS take one tablet when eating oily foods. needs PA for this med  Initial call taken by: De Nurse,  March 11, 2009 9:16 AM  Follow-up for Phone Call        will wait for pharmacy to send prior auth form here Follow-up by: Golden Circle RN,  March 11, 2009 9:19 AM  Additional Follow-up for Phone Call Additional follow up Details #1::        left message Additional Follow-up by: Golden Circle RN,  March 15, 2009 12:05 PM    Additional Follow-up for Phone Call Additional follow up Details #2::    medicaid pa form to pcp Follow-up by: Golden Circle RN,  March 15, 2009 4:01 PM  Additional Follow-up for Phone Call Additional follow up Details #3:: Details for Additional Follow-up Action Taken: LM on patient's phone telling her that it does not appear that she meets the requirements for PA on this med. I suggested getting ALLI from [employer] and taking 2 pills instead of one when she eats only foods. She is to call back with any questions.  Additional Follow-up by: Jamie Brookes MD,  March 15, 2009 7:56 PM

## 2010-04-21 NOTE — Progress Notes (Signed)
Summary: tirage   Phone Note Call from Patient Call back at [phone]   Caller: Patient Summary of Call: pt is having nausea problems, wants to either to come in or call something in  today Initial call taken by: De Nurse,  Aug 03, 2009 2:27 PM  Follow-up for Phone Call        started this am. states it is "real bad" no vomiting ot other symptoms. then states "I found it" had found a rx for phenergan that another md had given her. it is not expired. she told me she was going to take it. she does have an appt next week to see her pcp. told her to call back if fever, vomiting,diarrhea or anything else that worries her. she agreed with plan Follow-up by: Golden Circle RN,  Aug 03, 2009 2:44 PM    Noted. Jamie Brookes MD  Aug 04, 2009 9:16 AM

## 2010-04-21 NOTE — Assessment & Plan Note (Signed)
Summary: migrain,df   Vital Signs:  Patient profile:   35 year old female Height:      67 inches Weight:      210 pounds Temp:     97.8 degrees F oral BP sitting:   114 / 80  (right arm) Cuff size:   regular  Vitals Entered By: Tessie Fass CMA (January 21, 2010 10:18 AM) CC: migraine Pain Assessment Patient in pain? yes     Location: head Intensity: 10   Primary Care Brendalyn Vallely:  Jamie Brookes MD  CC:  migraine.  History of Present Illness: 1. Migraine: - She has had them daily for the past 4-5 months - Nothing seems to make them better.  She has tried Topamax( 75 mg /day), Sumatriptan (4 times a week), Ibuprofen (1600 mg a day) and Goody's powders (1-2 times a week) for them.  - She was given some Vicodin yesterday and those seemed to help - Pain still rated a 10/10 today - It is a constant pain - Located in bilateral temples with squeezing pain - Makes it difficult for her to get through her normal daily routine - Associated with photophobia, phonophobia, occ nausea  ROS: denies numbness, weakness, vision changes, gait problems, confusion  Current Medications (verified): 1)  Advair Diskus 250-50 Mcg/dose Misc (Fluticasone-Salmeterol) .... Inhale 1 Puff As Directed Twice A Day 2)  Albuterol 90 Mcg/act Aers (Albuterol) .... Inhale 1 Puff Every Four To Six Hours 3)  Synthroid 200 Mcg Tabs (Levothyroxine Sodium) .... One By Mouth Daily 4)  Zyrtec Hives Relief 10 Mg Tabs (Cetirizine Hcl) .... Take 1 Tablet By Mouth A Day 5)  Ibuprofen 800 Mg Tabs (Ibuprofen) .Marland Kitchen.. 1 Tablet Every 8 Hours For Pain 6)  Albuterol Sulfate (2.5 Mg/77ml) 0.083% Nebu (Albuterol Sulfate) .... Inhale 3ml Every 4 Hours As Needed For Dyspnea.  Give Supply For 60 Doses. 7)  Sumatriptan Succinate 100 Mg Tabs (Sumatriptan Succinate) .... One By Mouth As Needed Migraine (After Attempting Ibuprofen 800mg  First) 8)  Effexor Xr 150 Mg Xr24h-Cap (Venlafaxine Hcl) .... Take 1 Tablet By Mouth Qam W/ Food 9)   Flexeril 10 Mg Tabs (Cyclobenzaprine Hcl) .... Take One Tablet 3 Times A Day For Muscle Spasms 10)  Clobetasol Propionate 0.05 % Crea (Clobetasol Propionate) .... Use On Hair Loss Area Twice Daily 11)  Omeprazole 20 Mg Cpdr (Omeprazole) .... Take One Cp Daily 12)  Miralax  Powd (Polyethylene Glycol 3350) .... Take One Capful Daily. 13)  Trazodone Hcl 50 Mg Tabs (Trazodone Hcl) .... Take On Pill 15 Minutes Before You Are Ready To Go To Bed. 14)  Metoclopramide Hcl 10 Mg Tabs (Metoclopramide Hcl) .... Take 1 Tablet About 30 Minutes Before Meals 15)  Promethazine Hcl 25 Mg Tabs (Promethazine Hcl) .... Take 1 Pill At Night For Nausea (Do Not Drive While Taking This Medicine) 16)  Topiramate 25 Mg Tabs (Topiramate) .... Take 3 Tablets Daily 17)  Onetouch Test  Strp (Glucose Blood) .... Testing 1 Time A Day 18)  Lancets  Misc (Lancets) .... Testing 1 Time A Day 19)  Onetouch Ultra System W/device Kit (Blood Glucose Monitoring Suppl) .... Testing 1 Time A Day For Diabetes Type 2 Without Complications 250.0 20)  Metformin Hcl 1000 Mg Tabs (Metformin Hcl) .Marland Kitchen.. 1 Tab Every Morning and 1 Tab Every Evening 21)  Hydrocodone-Acetaminophen 5-500 Mg Tabs (Hydrocodone-Acetaminophen) .Marland Kitchen.. 1 Tab By Mouth Every 8 Hours As Needed For Headache  Allergies: No Known Drug Allergies  Past History:  Past Medical  History: Reviewed history from 01/20/2010 and no changes required. 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  Obese.  Uncomfortable appearing.  No acute  distress Head:  Normocephalic and atraumatic without obvious abnormalities.  Eyes:  No corneal or conjunctival inflammation noted. EOMI. Perrla. Vision grossly normal. Ears:  External ear exam shows no significant lesions or deformities.  Otoscopic examination reveals clear canals, tympanic membranes are intact bilaterally without bulging, retraction, inflammation or discharge. Hearing is grossly normal bilaterally. Nose:  External nasal examination shows no deformity or inflammation. Nasal mucosa are pink and moist without lesions or exudates. Mouth:  Oral mucosa and oropharynx without lesions or exudates.  Teeth in good repair. Neck:  No deformities, masses, or tenderness noted. Lungs:  Normal respiratory effort, chest expands symmetrically. Lungs are clear to auscultation, no crackles or wheezes. Heart:  Normal rate and regular rhythm. S1 and S2 normal without gallop, murmur, click, rub or other extra sounds. Extremities:  no clubbing, cyanosis, or edema Neurologic:  alert & oriented X3, cranial nerves II-XII intact, strength normal in all extremities, sensation intact to light touch, gait normal, and DTRs symmetrical and normal.   Skin:  no rashes and no suspicious lesions.   Psych:  not anxious appearing and not depressed appearing.     Impression & Recommendations:  Problem # 1:  MIGRAINE HEADACHE (ICD-346.90) Assessment Unchanged  Pt with persistent daily migraines.  She does improve with Toradol and Vicodin.  No red flags or neuro deficits.  Gave her another shot of Toradol and a refill on the Vicodin.  Advised pt to follow up with PCP in 1 week.  Also advised for her to keep the appointment with the [facility]. Her updated medication list for this problem includes:    Ibuprofen 800 Mg Tabs (Ibuprofen) .Marland Kitchen... 1 tablet every 8 hours for pain    Sumatriptan Succinate 100 Mg Tabs (Sumatriptan succinate) ..... One by mouth as needed migraine (after attempting ibuprofen 800mg   first)    Hydrocodone-acetaminophen 5-500 Mg Tabs (Hydrocodone-acetaminophen) .Marland Kitchen... 1 tab by mouth every 8 hours as needed for headache  Orders: [facility]- Est Level  3 ([address]) Ketorolac-Toradol 15mg  (U0454)  Complete Medication List: 1)  Advair Diskus 250-50 Mcg/dose Misc (Fluticasone-salmeterol) .... Inhale 1 puff as directed twice a day 2)  Albuterol 90 Mcg/act Aers (Albuterol) .... Inhale 1 puff every four to six hours 3)  Synthroid 200 Mcg Tabs (Levothyroxine sodium) .... One by mouth daily 4)  Zyrtec Hives Relief 10 Mg Tabs (Cetirizine hcl) .... Take 1 tablet by mouth a day 5)  Ibuprofen 800 Mg Tabs (Ibuprofen) .Marland Kitchen.. 1 tablet every 8 hours for pain 6)  Albuterol Sulfate (2.5 Mg/58ml) 0.083% Nebu (Albuterol sulfate) .... Inhale 3ml every 4 hours as needed for dyspnea.  give supply for 60 doses. 7)  Sumatriptan Succinate 100 Mg Tabs (Sumatriptan succinate) .... One by mouth as needed migraine (after attempting ibuprofen 800mg  first) 8)  Effexor Xr 150 Mg Xr24h-cap (Venlafaxine hcl) .... Take 1 tablet by mouth qam w/ food 9)  Flexeril 10 Mg Tabs (Cyclobenzaprine hcl) .... Take one tablet 3 times a day for muscle spasms 10)  Clobetasol Propionate 0.05 % Crea (Clobetasol propionate) .... Use on  hair loss area twice daily 11)  Omeprazole 20 Mg Cpdr (Omeprazole) .... Take one cp daily 12)  Miralax Powd (Polyethylene glycol 3350) .... Take one capful daily. 13)  Trazodone Hcl 50 Mg Tabs (Trazodone hcl) .... Take on pill 15 minutes before you are ready to go to bed. 14)  Metoclopramide Hcl 10 Mg Tabs (Metoclopramide hcl) .... Take 1 tablet about 30 minutes before meals 15)  Promethazine Hcl 25 Mg Tabs (Promethazine hcl) .... Take 1 pill at night for nausea (do not drive while taking this medicine) 16)  Topiramate 25 Mg Tabs (Topiramate) .... Take 3 tablets daily 17)  Onetouch Test Strp (Glucose blood) .... Testing 1 time a day 18)  Lancets Misc (Lancets) .... Testing 1 time a day 19)  Onetouch Ultra  System W/device Kit (Blood glucose monitoring suppl) .... Testing 1 time a day for diabetes type 2 without complications 250.0 20)  Metformin Hcl 1000 Mg Tabs (Metformin hcl) .Marland Kitchen.. 1 tab every morning and 1 tab every evening 21)  Hydrocodone-acetaminophen 5-500 Mg Tabs (Hydrocodone-acetaminophen) .Marland Kitchen.. 1 tab by mouth every 8 hours as needed for headache Prescriptions: HYDROCODONE-ACETAMINOPHEN 5-500 MG TABS (HYDROCODONE-ACETAMINOPHEN) 1 tab by mouth every 8 hours as needed for headache  #40 x 0   Entered and Authorized by:   Angelena Sole MD   Signed by:   Angelena Sole MD on 01/21/2010   Method used:   Print then Give to Patient   RxID:   [id number]    Medication Administration  Injection # 1:    Medication: Ketorolac-Toradol 15mg     Diagnosis: MIGRAINE HEADACHE (ICD-346.90)    Route: IM    Site: LUOQ gluteus    Exp Date: 01/19/2011    Lot #: [id number]    Mfr: hospira    Comments: 60mg /70ml given    Patient tolerated injection without complications    Given by: Tessie Fass CMA (January 21, 2010 10:39 AM)  Orders Added: 1)  [facility]- Est Level  3 [14782] 2)  Ketorolac-Toradol 15mg  [N5621]

## 2010-04-21 NOTE — Miscellaneous (Signed)
Summary: DNKA-meet new MD   Clinical Lists Changes

## 2010-04-21 NOTE — Assessment & Plan Note (Signed)
Summary: discuss meds/el  Medications Added ADVAIR DISKUS 250-50 MCG/DOSE MISC (FLUTICASONE-SALMETEROL) Inhale 1 puff as directed twice a day ALBUTEROL 90 MCG/ACT AERS (ALBUTEROL) Inhale 1 puff every four to six hours AMBIEN 10 MG TABS (ZOLPIDEM TARTRATE) Take 1 tablet by mouth at bedtime [employer] ASPIRIN 81 MG CHEW (ASPIRIN) Take 1 tablet by mouth once a day SYNTHROID 200 MCG TABS (LEVOTHYROXINE SODIUM) 1 tablet a day with the 25 micrograms tablet for thyroid. LEVOTHYROXINE SODIUM 25 MCG TABS (LEVOTHYROXINE SODIUM)  VALTREX 500 MG TABS (VALACYCLOVIR HCL) Take 1 tablet by mouth twice a day XANAX 0.25 MG TABS (ALPRAZOLAM) Take 1 tablet by mouth as directed ZYRTEC HIVES RELIEF 10 MG TABS (CETIRIZINE HCL) Take 1 tablet by mouth at bedtime IBUPROFEN 800 MG TABS (IBUPROFEN)       Allergies Added: NKDA  Vital Signs:  Patient Profile:   35 Years Old Female Weight:      186 pounds Pulse rate:   75 / minute BP sitting:   118 / 73  Vitals Entered By: Lillia Pauls CMA (October 31, 2006 4:19 PM)               Chief Complaint:  discuss switching celexa.  History of Present Illness: pt complains of being tired.  energy low.  tired with kids.  not sleeping well at night.  + stress.  Appetite is ok.  losing weight on purpose.  lots of dry skin.  pt currently only taking a day of synthroid when she was supposed to be taking a day.  tsh at that time was 4 range.    Antidepressant- celexa 60mg  mood- good at times, sometimes stress.  no SI or HI.  likes to go out by herself and ride around or go out to eat.  mood is ok.    needing more synthroid- out of synthroid.   ambien needing refill  Current Allergies: No known allergies       Physical Exam  General:     Well-developed,well-nourished,in no acute distress; alert,appropriate and cooperative throughout examination, tired appearing Head:     Normocephalic and atraumatic without obvious abnormalities. No  apparent alopecia or balding. Neck:     No deformities, masses.  mild  tenderness noted. Lungs:     Normal respiratory effort, chest expands symmetrically. Lungs are clear to auscultation, no crackles or wheezes. Heart:     Normal rate and regular rhythm. S1 and S2 normal without gallop, murmur, click, rub or other extra sounds. Psych:     tired, flattened affect.  Oriented X3.      Impression & Recommendations:  Problem # 1:  HYPOTHYROIDISM, POSTSURGICAL (ICD-244.0) pt had tsh and t4 checked.  levels are normal.  pt is to take only the tablet a day.  continue to monitor q 3 months.   Her updated medication list for this problem includes:    Synthroid 200 Mcg Tabs (Levothyroxine sodium) .Marland Kitchen... 1 tablet a day with the 25 micrograms tablet for thyroid.    Levothyroxine Sodium 25 Mcg Tabs (Levothyroxine sodium)   Problem # 2:  DEPRESSIVE DISORDER, NOS (ICD-311) pt is tolerating celexa 60mg , however is more tired.  will check thyroid and see if she needs more thyroid supplementation.  pt thinks some of her tiredness is realated to not sleeping well.  will recheck again in 1 month.  may need to change antidepressants.   Her updated medication list for this problem includes:    Celexa 20 Mg Tabs (Citalopram  hydrobromide) .Marland Kitchen... Take 3 tablets daily for depression, note this is an increase in your dosage.    Xanax 0.25 Mg Tabs (Alprazolam) .Marland Kitchen... Take 1 tablet by mouth as directed  Orders: [facility]- Est  Level 4 ([address]29)   Problem # 3:  INSOMNIA, CHRONIC (ICD-307.42) will refill ambien to use as as needed.  discussed with pt about side effects of ambien including sleep walking and short term amnesia.  pt ok with side effect profile of medication.  pt will not drive while taking meds unless it has been longer than 8 hours since taking the medication.  follow up in 1- 2 months for depression and thyroid.   Complete Medication List: 1)  Celexa 20 Mg Tabs (Citalopram hydrobromide) .... Take 3  tablets daily for depression, note this is an increase in your dosage. 2)  Advair Diskus 250-50 Mcg/dose Misc (Fluticasone-salmeterol) .... Inhale 1 puff as directed twice a day 3)  Albuterol 90 Mcg/act Aers (Albuterol) .... Inhale 1 puff every four to six hours 4)  Ambien 10 Mg Tabs (Zolpidem tartrate) .... Take 1 tablet by mouth at bedtime 5)  [employer]s Aspirin 81 Mg Chew (Aspirin) .... Take 1 tablet by mouth once a day 6)  Synthroid 200 Mcg Tabs (Levothyroxine sodium) .Marland Kitchen.. 1 tablet a day with the 25 micrograms tablet for thyroid. 7)  Levothyroxine Sodium 25 Mcg Tabs (Levothyroxine sodium) 8)  Valtrex 500 Mg Tabs (Valacyclovir hcl) .... Take 1 tablet by mouth twice a day 9)  Xanax 0.25 Mg Tabs (Alprazolam) .... Take 1 tablet by mouth as directed 10)  Zyrtec Hives Relief 10 Mg Tabs (Cetirizine hcl) .... Take 1 tablet by mouth at bedtime 11)  Ibuprofen 800 Mg Tabs (Ibuprofen) .Marland Kitchen.. 1 tablet every 8 hours for pain  Other Orders: Free T4-[facility] ([id number]) TSH-[facility] [phone]) Basic Met-[facility] [phone])   Patient Instructions: 1)  Please schedule a follow-up appointment in 3 months depression, thyroid 2)  i will call you about synthroid prescription.      Prescriptions: IBUPROFEN 800 MG TABS (IBUPROFEN)   #60 x 1   Entered and Authorized by:   Wilhemina Bonito  MD   Signed by:   Wilhemina Bonito  MD on 10/31/2006   Method used:   Electronically sent to ...       [facility] [phone] [facility] [address]       [address]       [place], [place]  [address]       Ph: [phone] or [phone]       Fax: [phone]   RxID:   [id number] AMBIEN 10 MG TABS (ZOLPIDEM TARTRATE) Take 1 tablet by mouth at bedtime  #20 x 0   Entered and Authorized by:   Wilhemina Bonito  MD   Signed by:   Wilhemina Bonito  MD on 10/31/2006   Method used:   Electronically sent to ...       [facility] [phone] [facility] [address]       [address]       [place], [place]   [address]       Ph: [phone] or [phone]       Fax: [phone]   RxID:   [id number] SYNTHROID 200 MCG TABS (LEVOTHYROXINE SODIUM) 1 tablet a day with the 25 micrograms tablet for thyroid.  #30 x 12   Entered and Authorized by:   Wilhemina Bonito  MD  Signed by:   Wilhemina Bonito  MD on 10/31/2006   Method used:   Electronically sent to ...       [facility] [phone] [address]       [address]       [place], [place]  [address]       Ph: [phone] or [phone]       Fax: [phone]   RxID:   [id number]

## 2010-04-21 NOTE — Letter (Signed)
Summary: Out of Work  [facility]  [address]   [place], [place] [address]   Phone: [phone]  Fax: [phone]    Aug 04, 2008   Employee:  CINZIA DEVOS    To Whom It May Concern:   For Medical reasons, please excuse the above named employee from work for the following dates:  Start:   08/04/08  End:   08/05/08  If you need additional information, please feel free to contact our office.         Sincerely,    Lupita Raider MD

## 2010-04-21 NOTE — Assessment & Plan Note (Signed)
Summary: asthma attack-very SOB   Vital Signs:  Patient Profile:   35 Years Old Female Weight:      198 pounds O2 Sat:      96 % Temp:     97.7 degrees F Pulse rate:   103 / minute BP sitting:   105 / 73  Vitals Entered By: Golden Circle RN (March 04, 2008 9:09 AM)             Is Patient Diabetic? Yes     PCP:  Lupita Raider MD  Chief Complaint:  asthma attack & out of dm meds.  History of Present Illness: 35 yo with h/o asthma.  Followed at [facility].  Supposed to be taking advair which she says she is taking every day.  Lots of recent stress lately.  Went to court yesterday with ex-husband who was cheating on her and has baby with another woman.  Feels tightness in chest which she attributes to stress.  Thinks albuterol helps with chest tightness, but she ran out of albuterol solution yesterday.  No wheezing, no cough, no URI symptoms.    Prescribed zoloft by [facility] for h/o anxiety/depression.  Not taking celexa prescribed by this clinic.    Current Allergies: No known allergies       Physical Exam  General:     NAD, alert. Lungs:     Normal respiratory effort, chest expands symmetrically. Lungs are clear to auscultation, no crackles or wheezes. Heart:     Normal rate and regular rhythm. S1 and S2 normal without gallop, murmur, click, rub or other extra sounds. Psych:     tearful    Impression & Recommendations:  Problem # 1:  ASTHMA, UNSPECIFIED (ICD-493.90) Refilled her albuterol even though she did not have wheezing on exam and I suspect her symptoms are related to anxiety.  Advised to f/u with new PCP for further refills. Her updated medication list for this problem includes:    Advair Diskus 250-50 Mcg/dose Misc (Fluticasone-salmeterol) ..... Inhale 1 puff as directed twice a day    Albuterol 90 Mcg/act Aers (Albuterol) ..... Inhale 1 puff every four to six hours    Albuterol Sulfate (2.5 Mg/15ml) 0.083% Nebu (Albuterol  sulfate) ..... Inhale 3ml every 4 hours as needed for dyspnea.  give supply for 60 doses.   Problem # 2:  DEPRESSIVE DISORDER, NOS (ICD-311) Assessment: Deteriorated Recent family stressors likely contributing to deterioration in breathing and tight chest.  She is taking z[facility]ft from [facility].  Unsure why she is also going there.  She says [facility] is her PCP.   Her updated medication list for this problem includes:    Celexa 20 Mg Tabs (Citalopram hydrobromide) .Marland Kitchen... Take 3 tablets daily for depression, note this is an increase in your dosage.    Xanax 0.25 Mg Tabs (Alprazolam) .Marland Kitchen... Take 1 tablet by mouth as directed  Orders: [facility]- Est  Level 4 ([address])   Complete Medication List: 1)  Celexa 20 Mg Tabs (Citalopram hydrobromide) .... Take 3 tablets daily for depression, note this is an increase in your dosage. 2)  Advair Diskus 250-50 Mcg/dose Misc (Fluticasone-salmeterol) .... Inhale 1 puff as directed twice a day 3)  Albuterol 90 Mcg/act Aers (Albuterol) .... Inhale 1 puff every four to six hours 4)  Ambien 10 Mg Tabs (Zolpidem tartrate) .... Take 1 tablet by mouth at bedtime 5)  Synthroid 200 Mcg Tabs (Levothyroxine sodium) .... One by mouth daily 6)  Xanax 0.25 Mg  Tabs (Alprazolam) .... Take 1 tablet by mouth as directed 7)  Zyrtec Hives Relief 10 Mg Tabs (Cetirizine hcl) .... Take 1 tablet by mouth at bedtime 8)  Ibuprofen 800 Mg Tabs (Ibuprofen) .Marland Kitchen.. 1 tablet every 8 hours for pain 9)  Albuterol Sulfate (2.5 Mg/42ml) 0.083% Nebu (Albuterol sulfate) .... Inhale 3ml every 4 hours as needed for dyspnea.  give supply for 60 doses.  Other Orders: [facility] ([address])   Patient Instructions: 1)  Your chest tightness is most likely due to stress or anxiety.  Continue taking zoloft regularly. 2)  I have refilled your albuterol nebulizer, but I did not hear any wheezing today on exam. 3)  The phone number for [facility] is [phone]. 4)  Keep your appointment with Dr. Clelia Croft  in January.   Prescriptions: ALBUTEROL SULFATE (2.5 MG/3ML) 0.083% NEBU (ALBUTEROL SULFATE) inhale 3ml every 4 hours as needed for dyspnea.  Give supply for 60 doses.  #1 x 0   Entered and Authorized by:   Sylvan Cheese MD   Signed by:   Sylvan Cheese MD on 03/04/2008   Method used:   [facility] to        [facility] [address] [phone]* (retail)       [address]       [place], [place]  [address]       Ph: [phone] or [phone]       Fax: [phone]   RxID:   [id number]  ] Laboratory Results   Blood Tests   Date/Time Received: March 04, 2008 9:29 AM  Date/Time Reported: March 04, 2008 9:57 AM   HGBA1C: 5.8%   (Normal Range: Non-Diabetic - 3-6%   Control Diabetic - 6-8%)  Comments: ...........test performed by...........Marland KitchenTerese Door, CMA

## 2010-04-21 NOTE — Miscellaneous (Signed)
Summary: HA, facial pain   Clinical Lists Changes sick "all week" taking goody powders. ear & face pain.advised using UC as we have no more appts left. she agreed.Golden Circle RN  March 18, 2009 2:02 PM   Thank you for the good advice given my patient.  Jamie Brookes MD  March 21, 2009 9:04 PM

## 2010-04-21 NOTE — Miscellaneous (Signed)
Summary: refill of Effexor XR and hydroxyzine  Medications Added EFFEXOR XR 150 MG XR24H-CAP (VENLAFAXINE HCL) take 1 tablet by mouth qAM w/ food HYDROXYZINE HCL 50 MG TABS (HYDROXYZINE HCL) take 1 pill when you are itchy, do not drive while taking this medication       Clinical Lists Changes  Medications: Changed medication from VENLAFAXINE HCL 225 MG XR24H-TAB (VENLAFAXINE HCL) take one tablet daily to EFFEXOR XR 150 MG XR24H-CAP (VENLAFAXINE HCL) take 1 tablet by mouth qAM w/ food - Signed Changed medication from HYDROXYZINE HCL 50 MG TABS (HYDROXYZINE HCL) take 1 pill when you are itchy, take 1 pill every evening. to HYDROXYZINE HCL 50 MG TABS (HYDROXYZINE HCL) take 1 pill when you are itchy, do not drive while taking this medication - Signed Rx of EFFEXOR XR 150 MG XR24H-CAP (VENLAFAXINE HCL) take 1 tablet by mouth qAM w/ food;  #34 x 6;  Signed;  Entered by: Jamie Brookes MD;  Authorized by: Jamie Brookes MD;  Method used: Electronically to [facility] [address] [phone]*, [address] [place], [place], [place]  [address], Ph: [phone] or [phone], Fax: [phone] Rx of HYDROXYZINE HCL 50 MG TABS (HYDROXYZINE HCL) take 1 pill when you are itchy, do not drive while taking this medication;  #30 x 1;  Signed;  Entered by: Jamie Brookes MD;  Authorized by: Jamie Brookes MD;  Method used: Electronically to [facility] [address] [phone]*, [address] [place], [place], [place]  [address], Ph: [phone] or [phone], Fax: [phone]    Prescriptions: HYDROXYZINE HCL 50 MG TABS (HYDROXYZINE HCL) take 1 pill when you are itchy, do not drive while taking this medication  #30 x 1   Entered and Authorized by:   Jamie Brookes MD   Signed by:   Jamie Brookes MD on 10/22/2009   Method used:   Electronically to        [facility] [address] [phone]* (retail)       [address]       [place], [place]  [address]       Ph: [phone] or  [phone]       Fax: [phone]   RxID:   [id number] EFFEXOR XR 150 MG XR24H-CAP (VENLAFAXINE HCL) take 1 tablet by mouth qAM w/ food  #34 x 6   Entered and Authorized by:   Jamie Brookes MD   Signed by:   Jamie Brookes MD on 10/22/2009   Method used:   Electronically to        [facility] [address] [phone]* (retail)       [address]       [place], [place]  [address]       Ph: [phone] or [phone]       Fax: [phone]   RxID:   [id number]

## 2010-04-21 NOTE — Progress Notes (Signed)
Summary: Celexa   Phone Note Call from Patient   Summary of Call: checking status of Celexa 40 mg Initial call taken by: Haydee Salter,  October 15, 2006 11:36 AM  Follow-up for Phone Call        message left on voicemail that message has been received and Dr. Sandria Manly will be in clinic tomorrow.will send message to her . Follow-up by: Theresia Lo RN,  October 15, 2006 4:29 PM  Additional Follow-up for Phone Call Additional follow up Details #1::        not on file in dr first.  who gave it to her?  when was it prescribed?   Additional Follow-up by: Wilhemina Bonito  MD,  October 16, 2006 8:34 AM    Additional Follow-up for Phone Call Additional follow up Details #2::    message left on voice mail to return call. pt calls back reporting she was first prescribed Celexa at the [facility] a year ago. medication Dr. Sandria Manly prescribed recently was not correct medication she states. Wants Celexa. was seen at [facility] by a nurse in April. Had appointment in May but was unable to stay to be seen because it took so long .actually last time seen by doctor was a year ago at [facility]. please call pt back on cell phone [phone].  [facility] [address] Follow-up by: Theresia Lo RN,  October 16, 2006 9:03 AM  Additional Follow-up for Phone Call Additional follow up Details #3:: Details for Additional Follow-up Action Taken: spoke with pt.  must make appt within 1-2 months to talk about depression.  increased dose to 60mg  by mouth daily for depression since not seeing it help much.   Additional Follow-up by: Wilhemina Bonito  MD,  October 16, 2006 10:10 AM

## 2010-04-21 NOTE — Progress Notes (Signed)
Summary: wi request   Phone Note Call from Patient Call back at [phone]   Reason for Call: Talk to Nurse Summary of Call: pt is requesting wi appt, sts she is having severe problems with her allergies and asthma.  Initial call taken by: Knox Royalty,  July 02, 2007 2:50 PM  Follow-up for Phone Call        message left to return call. Follow-up by: Theresia Lo RN,  July 02, 2007 2:53 PM  Additional Follow-up for Phone Call Additional follow up Details #1::        pt reports for past 2 weeks has had problem with asthma, sinus. she did go to urgent care week before last. now has shortness of breath, cough, runny nose,chest discomfort. advised to come to office now for work in appointment. Additional Follow-up by: Theresia Lo RN,  July 02, 2007 3:02 PM

## 2010-04-21 NOTE — Assessment & Plan Note (Signed)
Summary: derm referral, back pain, weight loss   Vital Signs:  Patient profile:   35 year old female Height:      67 inches Weight:      215.0 pounds BMI:     33.80 Temp:     98.7 degrees F oral Pulse rate:   91 / minute BP sitting:   123 / 86  (left arm) Cuff size:   large  Vitals Entered By: Gladstone Pih (Aug 12, 2009 4:01 PM) CC: derm referral Is Patient Diabetic? Yes Did you bring your meter with you today? No Pain Assessment Patient in pain? no        Primary Care Provider:  Jamie Brookes MD  CC:  derm referral.  History of Present Illness: Derm: Pt has lesions on her face and arms that erupt and cause scarring. She has asked several times to have a derm referral. I have explained that they are likely scarring from acne or other lesions but the patient is very concerned about them and wants to see a dermatologist. It is reasonable to see one to see if there is anything that can be done for the scars.   Back pain: The patient has been doing a lot more exercise lately. She is having some back pain. She has been taking Ibuprofen.   weight loss: Pt is eating better and working out. She has lost weight and gained muscle and despite her extra muscle she has lost 3 lbs. So proud of this patient!    Habits & Providers  Alcohol-Tobacco-Diet     Tobacco Status: never  Current Medications (verified): 1)  Advair Diskus 250-50 Mcg/dose Misc (Fluticasone-Salmeterol) .... Inhale 1 Puff As Directed Twice A Day 2)  Albuterol 90 Mcg/act Aers (Albuterol) .... Inhale 1 Puff Every Four To Six Hours 3)  Ambien 10 Mg Tabs (Zolpidem Tartrate) .... Take 1 Tablet By Mouth At Bedtime 4)  Synthroid 200 Mcg Tabs (Levothyroxine Sodium) .... One By Mouth Daily 5)  Zyrtec Hives Relief 10 Mg Tabs (Cetirizine Hcl) .... Take 1 Tablet By Mouth A Day 6)  Ibuprofen 800 Mg Tabs (Ibuprofen) .Marland Kitchen.. 1 Tablet Every 8 Hours For Pain 7)  Albuterol Sulfate (2.5 Mg/58ml) 0.083% Nebu (Albuterol Sulfate) ....  Inhale 3ml Every 4 Hours As Needed For Dyspnea.  Give Supply For 60 Doses. 8)  Sumatriptan Succinate 100 Mg Tabs (Sumatriptan Succinate) .... One By Mouth As Needed Migraine (After Attempting Ibuprofen 800mg  First) 9)  Venlafaxine Hcl 225 Mg Xr24h-Tab (Venlafaxine Hcl) .... Take One Tablet Daily 10)  Hydroxyzine Hcl 50 Mg Tabs (Hydroxyzine Hcl) .... Take 1 Pill When You Are Itchy, Take 1 Pill Every Evening. 11)  Cephalexin 500 Mg Caps (Cephalexin) .... Take One Pill Every 8 Hours For 7 Days. 12)  Flexeril 10 Mg Tabs (Cyclobenzaprine Hcl) .... Take One Tablet 3 Times A Day For Muscle Spasms 13)  Clobetasol Propionate 0.05 % Crea (Clobetasol Propionate) .... Use On Hair Loss Area Twice Daily 14)  Omeprazole 20 Mg Cpdr (Omeprazole) .... Take One Cp Daily 15)  Miralax  Powd (Polyethylene Glycol 3350) .... Take One Capful Daily. 16)  Xenical 120 Mg Caps (Orlistat) .... Take One Tablet When Eating Oily Foods. 17)  Trazodone Hcl 50 Mg Tabs (Trazodone Hcl) .... Take On Pill 15 Minutes Before You Are Ready To Go To Bed. 18)  Metoclopramide Hcl 10 Mg Tabs (Metoclopramide Hcl) .... Take 1 Tablet About 30 Minutes Before Meals 19)  Promethazine Hcl 25 Mg Tabs (Promethazine Hcl) .Marland KitchenMarland KitchenMarland Kitchen  Take 1 Pill At Night For Nausea (Do Not Drive While Taking This Medicine)  Allergies (verified): No Known Drug Allergies  Review of Systems        vitals reviewed and pertinent negatives and positives seen in HPI   Physical Exam  General:  Well-developed,well-nourished,in no acute distress; alert,appropriate and cooperative throughout examination Skin:  multiple hyperpigmented spots on her face. Psych:  Cognition and judgment appear intact. Alert and cooperative with normal attention span and concentration. No apparent delusions, illusions, hallucinations   Impression & Recommendations:  Problem # 1:  SKIN LESIONS, MULTIPLE (ICD-709.9) Assessment Unchanged PT has had these skins lesions that erupt and then scar as  hyperpigmented flat areas. She is very self conscious about them and has asked multiple times if there is anything to do about them and if she could go to see a dermatologist. I think it is reasonable at this time.   Orders: Dermatology Referral (Derm[facility]) [facility]- Est Level  3 ([address])  Problem # 2:  BACK PAIN (ICD-724.5) Assessment: New PT is having some back pain. She has recently started a work out regimine. Will treat pain with Ibuprofen. If it continues could consider PT.   Her updated medication list for this problem includes:    Ibuprofen 800 Mg Tabs (Ibuprofen) .Marland Kitchen... 1 tablet every 8 hours for pain    Flexeril 10 Mg Tabs (Cyclobenzaprine hcl) .Marland Kitchen... Take one tablet 3 times a day for muscle spasms  Orders: [facility]- Est Level  3 ([address])  Problem # 3:  WEIGHT LOSS (ICD-783.21) Assessment: Improved PT has lost about 3 lbs. She was working with a Systems analyst, she is waking up every day a 4:30 am to go to the gym to work out. She logs all her food intake and exercise in a book she brought with her today. She is doing great. Keep up the good work.   Orders: [facility]- Est Level  3 ([address])  Complete Medication List: 1)  Advair Diskus 250-50 Mcg/dose Misc (Fluticasone-salmeterol) .... Inhale 1 puff as directed twice a day 2)  Albuterol 90 Mcg/act Aers (Albuterol) .... Inhale 1 puff every four to six hours 3)  Ambien 10 Mg Tabs (Zolpidem tartrate) .... Take 1 tablet by mouth at bedtime 4)  Synthroid 200 Mcg Tabs (Levothyroxine sodium) .... One by mouth daily 5)  Zyrtec Hives Relief 10 Mg Tabs (Cetirizine hcl) .... Take 1 tablet by mouth a day 6)  Ibuprofen 800 Mg Tabs (Ibuprofen) .Marland Kitchen.. 1 tablet every 8 hours for pain 7)  Albuterol Sulfate (2.5 Mg/2ml) 0.083% Nebu (Albuterol sulfate) .... Inhale 3ml every 4 hours as needed for dyspnea.  give supply for 60 doses. 8)  Sumatriptan Succinate 100 Mg Tabs (Sumatriptan succinate) .... One by mouth as needed migraine (after attempting ibuprofen 800mg   first) 9)  Venlafaxine Hcl 225 Mg Xr24h-tab (Venlafaxine hcl) .... Take one tablet daily 10)  Hydroxyzine Hcl 50 Mg Tabs (Hydroxyzine hcl) .... Take 1 pill when you are itchy, take 1 pill every evening. 11)  Cephalexin 500 Mg Caps (Cephalexin) .... Take one pill every 8 hours for 7 days. 12)  Flexeril 10 Mg Tabs (Cyclobenzaprine hcl) .... Take one tablet 3 times a day for muscle spasms 13)  Clobetasol Propionate 0.05 % Crea (Clobetasol propionate) .... Use on hair loss area twice daily 14)  Omeprazole 20 Mg Cpdr (Omeprazole) .... Take one cp daily 15)  Miralax Powd (Polyethylene glycol 3350) .... Take one capful daily. 16)  Xenical 120 Mg Caps (Orlistat) .... Take  one tablet when eating oily foods. 17)  Trazodone Hcl 50 Mg Tabs (Trazodone hcl) .... Take on pill 15 minutes before you are ready to go to bed. 18)  Metoclopramide Hcl 10 Mg Tabs (Metoclopramide hcl) .... Take 1 tablet about 30 minutes before meals 19)  Promethazine Hcl 25 Mg Tabs (Promethazine hcl) .... Take 1 pill at night for nausea (do not drive while taking this medicine)  Patient Instructions: 1)  It was good to see you.  2)  We did a Derm referral. 3)  We refilled several meds.  4)  You can get your Pap smear date and then set an appointment up with me.  5)  I sent a copy of your labs with you.  Prescriptions: HYDROXYZINE HCL 50 MG TABS (HYDROXYZINE HCL) take 1 pill when you are itchy, take 1 pill every evening.  #60 x 1   Entered and Authorized by:   Jamie Brookes MD   Signed by:   Jamie Brookes MD on 08/18/2009   Method used:   Electronically to        [facility] [address] [phone]* (retail)       [address]       [place], [place]  [address]       Ph: [phone] or [phone]       Fax: [phone]   RxID:   [id number] TRAZODONE HCL 50 MG TABS (TRAZODONE HCL) take on pill 15 minutes before you are ready to go to bed.  #90 x 3   Entered and Authorized by:   Jamie Brookes MD    Signed by:   Jamie Brookes MD on 08/12/2009   Method used:   Electronically to        [facility] [address] [phone]* (retail)       [address]       [place], [place]  [address]       Ph: [phone] or [phone]       Fax: [phone]   RxID:   [id number] IBUPROFEN 800 MG TABS (IBUPROFEN) 1 tablet every 8 hours for pain  #90 x 5   Entered and Authorized by:   Jamie Brookes MD   Signed by:   Jamie Brookes MD on 08/12/2009   Method used:   Electronically to        [facility]  [facility] [address] [phone]* (retail)       [address]       [place], [place]  [address]       Ph: [phone] or [phone]       Fax: [phone]   RxID:   [id number] PROMETHAZINE HCL 25 MG TABS (PROMETHAZINE HCL) take 1 pill at night for nausea (do not drive while taking this medicine)  #31 x 5   Entered and Authorized by:   Jamie Brookes MD   Signed by:   Jamie Brookes MD on 08/12/2009   Method used:   Electronically to        [facility]  [facility] [address] [phone]* (retail)       [address]       [place], [place]  [address]       Ph: [phone] or [phone]       Fax: [phone]   RxID:   [id number] METOCLOPRAMIDE HCL 10  MG TABS (METOCLOPRAMIDE HCL) take 1 tablet about 30 minutes before meals  #90 x 5   Entered and Authorized by:   Jamie Brookes MD   Signed by:   Jamie Brookes MD on 08/12/2009   Method used:   Electronically to        [facility]  [facility] [address] [phone]* (retail)       [address]       [place], [place]  [address]       Ph: [phone] or [phone]       Fax: [phone]   RxID:   [id number]   Prevention & Chronic Care Immunizations   Influenza vaccine: Not documented    Tetanus booster: 08/19/2003: Done.    Pneumococcal vaccine: Not documented  Other Screening   Pap smear: Done.  (12/23/2003)   Smoking status: never  (08/12/2009)  Diabetes Mellitus   HgbA1C:  6.5  (12/21/2008)    Eye exam: Not documented    Foot exam: Not documented   High risk foot: Not documented   Foot care education: Not documented    Urine microalbumin/creatinine ratio: Not documented  Self-Management Support :    Diabetes self-management support: Not documented

## 2010-04-21 NOTE — Progress Notes (Signed)
Summary: requesting refill   Phone Note Call from Patient Call back at [phone]   Reason for Call: Refill Medication Summary of Call: pt is requesting a refill on her tussionex Initial call taken by: ERIN LEVAN,  February 13, 2007 2:17 PM  Follow-up for Phone Call        CALLED PT.LMAM UNABLE TO RF MEDS. Follow-up by: Arlyss Repress CMA,,  February 13, 2007 5:46 PM

## 2010-04-21 NOTE — Progress Notes (Signed)
Summary: phn msg   Phone Note Call from Patient Call back at Home Phone [phone]   Caller: Patient Summary of Call: wants to know who she will be reassigned to - needs to make appt. Initial call taken by: De Nurse,  March 07, 2010 9:24 AM  Follow-up for Phone Call        Spoke with her.  She did not want to discuss any specifics as to why she wanted to change - she really wants a full time faculty physician.   I explained to her that our policy was not to change from a resident PCP to a faculty and that all residents had faculty to discuss issues with.  She related that then she did not want to change from Dr Clotilde Dieter if she could not have a Oncologist.   Follow-up by: Pearlean Brownie MD,  March 07, 2010 10:32 AM

## 2010-04-21 NOTE — Assessment & Plan Note (Signed)
Summary: re; refilling levothyroxine   Prescriptions: SYNTHROID 200 MCG TABS (LEVOTHYROXINE SODIUM) one by mouth daily  #30 x 0   Entered and Authorized by:   Lupita Raider MD   Signed by:   Lupita Raider MD on 12/25/2007   Method used:   Electronically to        [facility] [address] [phone]* (retail)       [address]       [place], [place]  [address]       Ph: [phone] or [phone]       Fax: [phone]   RxID:   [id number]  received refill request from pharmacy [facility]roxine. [facility]  [address]    patient has not been seen in this office since 07/05/07. message left on voicemail to call for appointment then once this is done we can call in enough to last until her appointment . will place refill request in MD box. there is a question from pharmacist stating patient has an Rx for 200 mg also and   needs to know which dosage she needs. will wait for patient to return call to schedule appointment. MAKAELA CANDO RN  December 24, 2007 11:06 AM   pt scheduled an appt for 10/20 @ 4:00pm with Dr. Clelia Croft, as [facility] said that was her new pcp.....WHITNEY POOLE 12/25/07 10:04AM  Appended Document: re; refilling levothyroxine  above was not an office visit . was entered as office visit in error.

## 2010-04-21 NOTE — Progress Notes (Signed)
Summary: celexa sent...must make appt   Phone Note Refill Request   I don't have the patient listed as recieving celexa in the past year.  I need to know who the doctor was that prescribed it and when it was prescribed before I will refill it.    Initial call taken by: Wilhemina Bonito  MD,  October 16, 2006 8:33 AM  Follow-up for Phone Call        message left on voicemail to return call. Follow-up by: Theresia Lo RN,  October 16, 2006 9:04 AM    New/Updated Medications: CELEXA 20 MG TABS (CITALOPRAM HYDROBROMIDE) take 3 tablets daily for depression, note this is an increase in your dosage.  Prescriptions: CELEXA 20 MG TABS (CITALOPRAM HYDROBROMIDE) take 3 tablets daily for depression, note this is an increase in your dosage.  #93 x 1   Entered and Authorized by:   Wilhemina Bonito  MD   Signed by:   Wilhemina Bonito  MD on 10/16/2006   Method used:   Electronically sent to ...       [facility]       [address]       [place], [place]  [address]       Ph: [phone]       Fax: [phone]   RxID:   [id number]

## 2010-04-21 NOTE — Progress Notes (Signed)
Summary: triage   Phone Note Call from Patient Call back at [phone]   Caller: Patient Summary of Call: head hurts really bad and sick to her stomach Initial call taken by: De Nurse,  September 13, 2009 12:11 PM  Follow-up for Phone Call        sick x 2 days. sent to UC as we have no more appts left  Follow-up by: Golden Circle RN,  September 13, 2009 12:17 PM  Additional Follow-up for Phone Call Additional follow up Details #1::        will be happy to follow up if necessary.  Additional Follow-up by: Jamie Brookes MD,  September 13, 2009 12:25 PM

## 2010-04-21 NOTE — Assessment & Plan Note (Signed)
Summary: headache, STD check ./ls   Vital Signs:  Patient profile:   35 year old female Height:      67 inches Weight:      204 pounds BMI:     32.07 Temp:     98.6 degrees F oral BP sitting:   114 / 82  (left arm) Cuff size:   large  Vitals Entered By: Tessie Fass CMA (February 07, 2010 8:49 AM) CC: headache. STD screen Pain Assessment Patient in pain? yes     Location: head Intensity: 10   Primary Care Anda Sobotta:  Jamie Brookes MD  CC:  headache. STD screen.  History of Present Illness: 1) STD Check: Reports that she would like to be tested for STDs - had unprotected intercourse recently (not with her partner). Reports some mild vaginitis and vaginal discharge.   2) Migraine: History of migaine headache. Reports daily migraine for at least past week. On sumatriptan for abortive therapy, on topamax for preventive. Triptan gives partial relief. Symptoms include pounding frontal headache, nausea, emesis, blurry vision. Symptoms worse with loud noises, bright lights, stress. Only thing that works for headache is hydrocodone-APAP. Has also tried ibuprofen, [employer] Pow[employer].    ROS: denies numbness, weakness, vision changes, gait problems, confusion, fever, chills, weight loss, vaginal lesions,   Current Medications (verified): 1)  Advair Diskus 250-50 Mcg/dose Misc (Fluticasone-Salmeterol) .... Inhale 1 Puff As Directed Twice A Day 2)  Albuterol 90 Mcg/act Aers (Albuterol) .... Inhale 1 Puff Every Four To Six Hours 3)  Synthroid 200 Mcg Tabs (Levothyroxine Sodium) .... One By Mouth Daily 4)  Zyrtec Hives Relief 10 Mg Tabs (Cetirizine Hcl) .... Take 1 Tablet By Mouth A Day 5)  Ibuprofen 800 Mg Tabs (Ibuprofen) .Marland Kitchen.. 1 Tablet Every 8 Hours For Pain 6)  Albuterol Sulfate (2.5 Mg/39ml) 0.083% Nebu (Albuterol Sulfate) .... Inhale 3ml Every 4 Hours As Needed For Dyspnea.  Give Supply For 60 Doses. 7)  Sumatriptan Succinate 100 Mg Tabs (Sumatriptan Succinate) .... One By Mouth As  Needed Migraine (After Attempting Ibuprofen 800mg  First) 8)  Effexor Xr 150 Mg Xr24h-Cap (Venlafaxine Hcl) .... Take 1 Tablet By Mouth Qam W/ Food 9)  Flexeril 10 Mg Tabs (Cyclobenzaprine Hcl) .... Take One Tablet 3 Times A Day For Muscle Spasms 10)  Clobetasol Propionate 0.05 % Crea (Clobetasol Propionate) .... Use On Hair Loss Area Twice Daily 11)  Omeprazole 20 Mg Cpdr (Omeprazole) .... Take One Cp Daily 12)  Miralax  Powd (Polyethylene Glycol 3350) .... Take One Capful Daily. 13)  Trazodone Hcl 50 Mg Tabs (Trazodone Hcl) .... Take On Pill 15 Minutes Before You Are Ready To Go To Bed. 14)  Metoclopramide Hcl 10 Mg Tabs (Metoclopramide Hcl) .... Take 1 Tablet About 30 Minutes Before Meals 15)  Promethazine Hcl 25 Mg Tabs (Promethazine Hcl) .... Take 1 Pill At Night For Nausea (Do Not Drive While Taking This Medicine) 16)  Topiramate 25 Mg Tabs (Topiramate) .... Take 3 Tablets Daily 17)  Onetouch Test  Strp (Glucose Blood) .... Testing 1 Time A Day 18)  Lancets  Misc (Lancets) .... Testing 1 Time A Day 19)  Onetouch Ultra System W/device Kit (Blood Glucose Monitoring Suppl) .... Testing 1 Time A Day For Diabetes Type 2 Without Complications 250.0 20)  Metformin Hcl 1000 Mg Tabs (Metformin Hcl) .Marland Kitchen.. 1 Tab Every Morning and 1 Tab Every Evening 21)  Hydrocodone-Acetaminophen 5-500 Mg Tabs (Hydrocodone-Acetaminophen) .Marland Kitchen.. 1 Tab By Mouth Every 8 Hours As Needed For Headache  Allergies (verified):  No Known Drug Allergies  Physical Exam  General:  Vitals reviewed.  Obese.  Uncomfortable appearing.  No acute distress Mouth:  no oral lesions  Neck:  no lymphadenopathy   Genitalia:  Normal introitus for age, no external lesions, mucosa pink and moist, no vaginal or cervical lesions, no vaginal atrophy, no friaility or hemorrhage, normal uterus size and position, no adnexal masses or tenderness. Mild clear vaginal discharge w/o odor.  Neurologic:  alert & oriented X3, cranial nerves II-XII intact,  strength normal in all extremities, sensation intact to light touch, gait normal, and DTRs symmetrical and normal.     Impression & Recommendations:  Problem # 1:  CONTACT OR EXPOSURE TO OTHER VIRAL DISEASES (ICD-V01.79) Assessment New Will check HIV, RPR, HepBsAg. Advised regarding safe sexual practices.   Orders: HIV-[facility] (95284-13244) Hep Bs Ag-[facility] (01027-25366) RPR-[facility] [phone]) [facility]- Est  Level 4 (56387)  Problem # 2:  UNSPECIFIED VAGINITIS AND VULVOVAGINITIS (ICD-616.10) Assessment: New Will check GC/CT, wet prep. Advised regarding safe sexual practices.  Orders: GC/Chlamydia-[facility] (87591/87491) Wet Prep- [facility] (56433) [facility]- Est  Level 4 (29518)  Problem # 3:  MIGRAINE HEADACHE (ICD-346.90) Assessment: Deteriorated Deteriorated. Will refill Sumatriptan. Patient does not wish to continue taking Topamax. Would like refill of Vicodin. Advised agaisnt refill narcotic pain medication for abortive therapy. Advised regarding talking with her physician regarding new preventive medication. [facility]). Advised to follow up with PCP (or new PCP as she wishes to be reassigned) soon regarding the issue of preventive therapies.   Her updated medication list for this problem includes:    Ibuprofen 800 Mg Tabs (Ibuprofen) .Marland Kitchen... 1 tablet every 8 hours for pain    Sumatriptan Succinate 100 Mg Tabs (Sumatriptan succinate) ..... One by mouth as needed migraine (after attempting ibuprofen 800mg  first)   Orders: Promethazine up to 50mg  (J2550) Ketorolac-Toradol 15mg  (A4166) [facility]- Est  Level 4 (06301)  Complete Medication List: 1)  Advair Diskus 250-50 Mcg/dose Misc (Fluticasone-salmeterol) .... Inhale 1 puff as directed twice a day 2)  Albuterol 90 Mcg/act Aers (Albuterol) .... Inhale 1 puff every four to six hours 3)  Synthroid 200 Mcg Tabs (Levothyroxine sodium) .... One by mouth daily 4)  Zyrtec Hives Relief 10 Mg Tabs (Cetirizine hcl) ....  Take 1 tablet by mouth a day 5)  Ibuprofen 800 Mg Tabs (Ibuprofen) .Marland Kitchen.. 1 tablet every 8 hours for pain 6)  Albuterol Sulfate (2.5 Mg/67ml) 0.083% Nebu (Albuterol sulfate) .... Inhale 3ml every 4 hours as needed for dyspnea.  give supply for 60 doses. 7)  Sumatriptan Succinate 100 Mg Tabs (Sumatriptan succinate) .... One by mouth as needed migraine (after attempting ibuprofen 800mg  first) 8)  Effexor Xr 150 Mg Xr24h-cap (Venlafaxine hcl) .... Take 1 tablet by mouth qam w/ food 9)  Flexeril 10 Mg Tabs (Cyclobenzaprine hcl) .... Take one tablet 3 times a day for muscle spasms 10)  Clobetasol Propionate 0.05 % Crea (Clobetasol propionate) .... Use on hair loss area twice daily 11)  Omeprazole 20 Mg Cpdr (Omeprazole) .... Take one cp daily 12)  Miralax Powd (Polyethylene glycol 3350) .... Take one capful daily. 13)  Trazodone Hcl 50 Mg Tabs (Trazodone hcl) .... Take on pill 15 minutes before you are ready to go to bed. 14)  Metoclopramide Hcl 10 Mg Tabs (Metoclopramide hcl) .... Take 1 tablet about 30 minutes before meals 15)  Promethazine Hcl 25 Mg Tabs (Promethazine hcl) .... Take 1 pill at night for nausea (do not  drive while taking this medicine) 16)  Topiramate 25 Mg Tabs (Topiramate) .... Take 3 tablets daily 17)  Onetouch Test Strp (Glucose blood) .... Testing 1 time a day 18)  Lancets Misc (Lancets) .... Testing 1 time a day 19)  Onetouch Ultra System W/device Kit (Blood glucose monitoring suppl) .... Testing 1 time a day for diabetes type 2 without complications 250.0 20)  Metformin Hcl 1000 Mg Tabs (Metformin hcl) .Marland Kitchen.. 1 tab every morning and 1 tab every evening 21)  Hydrocodone-acetaminophen 5-500 Mg Tabs (Hydrocodone-acetaminophen) .Marland Kitchen.. 1 tab by mouth every 8 hours as needed for headache  Patient Instructions: 1)  I have refilled your sumatriptan - take as directed for migraine  2)  You will need to talk with your new doctor about getting started on a medication to PREVENT migraines (one  that works better than the Topamax)  3)  I will let you know the results of your tests.  Prescriptions: SUMATRIPTAN SUCCINATE 100 MG TABS (SUMATRIPTAN SUCCINATE) one by mouth as needed migraine (after attempting ibuprofen 800mg  first)  #20 x 2   Entered and Authorized by:   Bobby Rumpf  MD   Signed by:   Bobby Rumpf  MD on 02/07/2010   Method used:   [facility]        [facility] [address] [phone][facility] (retail)       [address]       [place], [place]  [address]       Ph: [phone] or [phone]       Fax: [phone]   RxID:   [id number]    Medication Administration  Injection # 1:    Medication: Promethazine up to 50mg     Diagnosis: MIGRAINE HEADACHE (ICD-346.90)    Route: IM    Site: LUOQ gluteus    Exp Date: 08/19/2011    Lot #: [id number]    Mfr: [email or web address]    Comments: 25mg  given- Pt sts that she has someone to drive her home    Patient tolerated injection without complications    Given by: Jone Baseman CMA (February 07, 2010 10:25 AM)  Injection # 2:    Medication: Ketorolac-Toradol 15mg     Diagnosis: MIGRAINE HEADACHE (ICD-346.90)    Route: IM    Site: RUOQ gluteus    Exp Date: 07/19/2011    Lot #: [id number]    Mfr: [email or web address]    Patient tolerated injection without complications    Given by: Jone Baseman CMA (February 07, 2010 10:26 AM)  Orders Added: 1)  GC/Chlamydia-[facility] [87591/87491] 2)  Mellody Drown Prep- [facility] [64332] 3)  HIV-[facility] [95188-41660] 4)  Hep Bs Ag-[facility] [63016-01093] 5)  RPR-[facility] [23557-32202] 6)  Promethazine up to 50mg  [J2550] 7)  Ketorolac-Toradol 15mg  [J1885] 8)  [facility]- Est  Level 4 [54270]     Medication Administration  Injection # 1:    Medication: Promethazine up to 50mg     Diagnosis: MIGRAINE HEADACHE (ICD-346.90)    Route: IM    Site: LUOQ gluteus    Exp Date: 08/19/2011    Lot #: [id number]    Mfr: [email or web address]    Comments: 25mg  given- Pt sts that she has someone to drive her home    Patient tolerated injection  without complications    Given by: Jone Baseman CMA (February 07, 2010 10:25 AM)  Injection # 2:    Medication: Ketorolac-Toradol 15mg     Diagnosis: MIGRAINE HEADACHE (ICD-346.90)    Route: IM  Site: RUOQ gluteus    Exp Date: 07/19/2011    Lot #: [id number]    Mfr: hospira    Patient tolerated injection without complications    Given by: Jone Baseman CMA (February 07, 2010 10:26 AM)  Orders Added: 1)  GC/Chlamydia-[facility] [87591/87491] 2)  Wet Prep- [facility] [87210] 3)  HIV-[facility] [04540-98119] 4)  Hep Bs Ag-[facility] [14782-95621] 5)  RPR-[facility] [30865-78469] 6)  Promethazine up to 50mg  [J2550] 7)  Ketorolac-Toradol 15mg  [J1885] 8)  [facility]- Est  Level 4 [62952]  Appended Document: wet prep results  Laboratory Results  Date/Time Received: February 07, 2010 10:16 AM  Date/Time Reported: February 07, 2010 10:34 AM   [employer] Source: vaginal WBC/hpf: 10-20 Bacteria/hpf: 3+ Clue cells/hpf: none Yeast/hpf: none Trichomonas/hpf: none Comments: occ RBC present...........test performed by...........Marland KitchenTerese Door, CMA

## 2010-04-21 NOTE — Miscellaneous (Signed)
Summary: Hydoxyzine refill   Clinical Lists Changes  Medications: Rx of HYDROXYZINE HCL 50 MG TABS (HYDROXYZINE HCL) take 1 pill when you are itchy, take 1 pill every evening.;  #60 x 2;  Signed;  Entered by: Jamie Brookes MD;  Authorized by: Jamie Brookes MD;  Method used: [facility] [address] [phone]*, [address] [place], [place], [place]  [address], Ph: [phone] or [phone], Fax: [phone]    Prescriptions: HYDROXYZINE HCL 50 MG TABS (HYDROXYZINE HCL) take 1 pill when you are itchy, take 1 pill every evening.  #60 x 2   Entered and Authorized by:   Jamie Brookes MD   Signed by:   Jamie Brookes MD on 03/26/2009   Method used:   [facility]        [facility] [address] [phone]* (retail)       [address]       [place], [place]  [address]       Ph: [phone] or [phone]       Fax: [phone]   RxID:   [id number]

## 2010-04-21 NOTE — Progress Notes (Signed)
Summary: Return call   Phone Note Call from Patient Call back at [phone]   Summary of Call: pt states she is returning  Sally's call Initial call taken by: Haydee Salter,  October 12, 2006 8:54 AM  Follow-up for Phone Call        wants rx for Celexa 40mg  [facility] [address] states she is out of meds, does not want the other antidepressant that had been rx. message to md Follow-up by: Golden Circle RN,  October 12, 2006 9:00 AM

## 2010-04-21 NOTE — Assessment & Plan Note (Signed)
Summary: asthma/ mbw   PCP:  Wilhemina Bonito  MD  Chief Complaint:  tightness in chest, productive cough with yellow-clear sputum, nasal congestion, and chills x3 weeks denies fever/sweats.  History of Present Illness: 35 year old female with known history of asthma now seen in greater than 2 years. doing well until 3 weeks ago when she developed cough congestion thick mucus, nasal drip, wheezing and tightness. Seen at urgent care given zpack no better then seen at [facility] given steroid pack and zyrtec 5 days ago, sx still persist. Denies chest pain, dyspnea, orthopnea, hemoptysis, fever, n/v/d, edema.     Prior Medication List:  CELEXA 20 MG TABS (CITALOPRAM HYDROBROMIDE) take 3 tablets daily for depression, note this is an increase in your dosage. ADVAIR DISKUS 250-50 MCG/DOSE MISC (FLUTICASONE-SALMETEROL) Inhale 1 puff as directed twice a day ALBUTEROL 90 MCG/ACT AERS (ALBUTEROL) Inhale 1 puff every four to six hours AMBIEN 10 MG TABS (ZOLPIDEM TARTRATE) Take 1 tablet by mouth at bedtime [employer] ASPIRIN 81 MG CHEW (ASPIRIN) Take 1 tablet by mouth once a day SYNTHROID 200 MCG TABS (LEVOTHYROXINE SODIUM) 1 tablet a day with the 25 micrograms tablet for thyroid. LEVOTHYROXINE SODIUM 25 MCG TABS (LEVOTHYROXINE SODIUM)  VALTREX 500 MG TABS (VALACYCLOVIR HCL) Take 1 tablet by mouth twice a day XANAX 0.25 MG TABS (ALPRAZOLAM) Take 1 tablet by mouth as directed ZYRTEC HIVES RELIEF 10 MG TABS (CETIRIZINE HCL) Take 1 tablet by mouth at bedtime IBUPROFEN 800 MG TABS (IBUPROFEN) 1 tablet every 8 hours for pain   Current Allergies (reviewed today): No known allergies   Past Medical History:    Reviewed history from 05/17/2006 and no changes required:       LEG CRAMPS (ICD-729.82)       COUGH (ICD-786.2)       INSOMNIA, CHRONIC (ICD-307.42)       HYPOTHYROIDISM, POSTSURGICAL (ICD-244.0)       COUNSELING , OTHER, NEC (ICD-V65.49)       RHINITIS, ALLERGIC (ICD-477.9)  OBESITY, NOS (ICD-278.00)       MYASTHENIA GRAVIS (ICD-358.0)       HYPOTHYROIDISM, UNSPECIFIED (ICD-244.9)       DIABETES MELLITUS II, UNCOMPLICATED (ICD-250.00)       DEPRESSIVE DISORDER, NOS (ICD-311)       ASTHMA, UNSPECIFIED (ICD-493.90)           Family History:    Reviewed history from 05/17/2006 and no changes required:       Diabetes 1st degree, Father has first MI at 61 yo, HTN, Husband has HBV, sister has SLE  Social History:    Reviewed history from 05/17/2006 and no changes required:       Lives with husband and 2 children.  Unemployed.  Has received disability  secondary to myasthenia.  No tobacco, EtOH, recreational drugs.  Home is safe environment.  Currently completing certificate for nursing aide.  Hopes to seek employment as HHRN.  Understands that I will not renew her disability- initially obtained with Dr. Sharene Skeans.   Risk Factors: Tobacco use:  never  PAP Smear History:    Date of Last PAP Smear:  12/23/2003   Review of Systems      See HPI   Vital Signs:  Patient Profile:   35 Years Old Female Weight:      187.38 pounds O2 Sat:      100 % O2 treatment:    Room Air Temp:     98.4 degrees F oral Pulse rate:  72 / minute BP sitting:   100 / 70  (left arm) Cuff size:   regular  Vitals Entered By: Boone Master CNA (July 09, 2007 11:13 AM)             Is Patient Diabetic? No Comments Medications reviewed with patient ..................................................................Marland KitchenBoone Master CNA  July 09, 2007 11:13 AM      Physical Exam  Pt is in no acute distress. Vital signs stable.  HEENT: Nasal mucosa erythematous with mild edema.  Ear canals are clear bilaterally. Maxillary sinus pressure. NECK: Supple without cervical adenopathy or tenderness. Trachea is midline without lymphadenopathy. LUNGS: Lung fields are clear bilaterally. HEART: Regular rate and rhythm without murmur, gallop or rub. ABDOMEN: Soft, benign. EXTREMITIES:  Warm without calf tenderness, cyanosis, clubbing or edema.       Impression & Recommendations:  Problem # 1:  ASTHMATIC BRONCHITIS, ACUTE (ICD-466.0) Slow to resolve with suspected associated sinusitis.  Xopenex neb given, CXR pending.  Augmentin for 10 days  Mucinex DM two times a day as needed cough congestion Saline Nasal  2puffs four times a day Advair 250/50mg   two times a day  Nexium 40mg  once daily for 10 days.  Please contact office for sooner follow up if symptoms do not improve or worsen  follow up Dr. Sherene Sires 2 weeks  Her updated medication list for this problem includes:    Advair Diskus 250-50 Mcg/dose Misc (Fluticasone-salmeterol) ..... Inhale 1 puff as directed twice a day    Albuterol 90 Mcg/act Aers (Albuterol) ..... Inhale 1 puff every four to six hours    Augmentin 875-125 Mg Tabs (Amoxicillin-pot clavulanate) .Marland Kitchen... 1 by mouth two times a day  Orders: T-2 View CXR, Same Day (71020.5TC) Nebulizer Tx (04540) Est. Patient Level IV (98119)  Take antibiotics and other medications as directed. Encouraged to push clear liquids, get enough rest, and take acetaminophen as needed. To be seen in 5-7 days if no improvement, sooner if worse.   Medications Added to Medication List This Visit: 1)  Synthroid 175 Mcg Tabs (Levothyroxine sodium) .... Take 1 tablet by mouth once a day 2)  Augmentin 875-125 Mg Tabs (Amoxicillin-pot clavulanate) .Marland Kitchen.. 1 by mouth two times a day   Patient Instructions: 1)  Augmentin for 10 days 2)   Mucinex DM two times a day as needed cough congestion 3)  Saline Nasal  2puffs four times a day 4)  Advair 250/50mg   two times a day  5)  Nexium 40mg  once daily for 10 days.  6)  Please contact office for sooner follow up if symptoms do not improve or worsen  7)  follow up Dr. Sherene Sires 2 weeks     Prescriptions: AUGMENTIN 875-125 MG  TABS (AMOXICILLIN-POT CLAVULANATE) 1 by mouth two times a day  #20 x 0   Entered and Authorized by:   Rubye Oaks  NP   Signed by:   Tammy Parrett NP on 07/09/2007   Method used:   [facility] ...       [facility] [address] [phone]*       [address]       [place], [place]  [address]       Ph: [phone] or [phone]       Fax: [phone]   RxID:   [id number]  ]

## 2010-04-21 NOTE — Assessment & Plan Note (Signed)
Summary: Depression/insomnia/GERD, Obesity, Hair loss, need to discuss DM   Vital Signs:  Patient profile:   35 year old female Height:      66.5 inches Weight:      216 pounds BMI:     34.47 Temp:     98.2 degrees F oral Pulse rate:   103 / minute BP sitting:   126 / 83  (right arm) Cuff size:   large  Vitals Entered By: Tessie Fass CMA (March 03, 2009 4:07 PM) CC: Depression/Insomnia, Constipation, Gerd, Hair loss, Obesity Is Patient Diabetic? Yes Pain Assessment Patient in pain? no        Primary Care Provider:  Jamie Brookes MD  CC:  Depression/Insomnia, Constipation, Gerd, Hair loss, and Obesity.  History of Present Illness: Depression/ Insomnia: Pt has insomnia every since she got a subpeona 2-3 weeks ago. She is only able to sleep for about 4 hours and then she wakes up and can't go back to sleep. She is in school and needs to be able to sleep. She has tried [place] and it is not working. She is asking for Xanax (says her sister takes it and says it's great) or Valium by name.   Constipation: Pt says that she has had normal daily stools until about 2-3 weeks ago. She is not "constipated" because she is only having a BM every other day. She is taking Miralax and wants to have a refill. She also asked about a colon cleanse and we discussed eating foods high in fiber. I gave her a handout with lots of fiber foods on it.   Hair Loss: Pt now is wearing a wig that is glued on, but says that she has hair loss in a patch on her head. She was being treated by a dermatologist with Clobetasol 0.05% cream and wants to be treated with it again.   Gerd: Pt has reflux and is currently out of her Omeprazole. Pt wants a refill. Says it really helps her.   Obesity: Pt is obese and wants a pill to make her lose weight. She had discussed Xenical with Dr. Philipp Deputy before she graduated and wants to try it now. Discussed side effects. Also discussed only using it when she was out and  had a unavoidably fattening meal.     Habits & Providers  Alcohol-Tobacco-Diet     Tobacco Status: never  Current Medications (verified): 1)  Advair Diskus 250-50 Mcg/dose Misc (Fluticasone-Salmeterol) .... Inhale 1 Puff As Directed Twice A Day 2)  Albuterol 90 Mcg/act Aers (Albuterol) .... Inhale 1 Puff Every Four To Six Hours 3)  Ambien 10 Mg Tabs (Zolpidem Tartrate) .... Take 1 Tablet By Mouth At Bedtime 4)  Synthroid 200 Mcg Tabs (Levothyroxine Sodium) .... One By Mouth Daily 5)  Zyrtec Hives Relief 10 Mg Tabs (Cetirizine Hcl) .... Take 1 Tablet By Mouth A Day 6)  Ibuprofen 800 Mg Tabs (Ibuprofen) .Marland Kitchen.. 1 Tablet Every 8 Hours For Pain 7)  Albuterol Sulfate (2.5 Mg/66ml) 0.083% Nebu (Albuterol Sulfate) .... Inhale 3ml Every 4 Hours As Needed For Dyspnea.  Give Supply For 60 Doses. 8)  Sumatriptan Succinate 100 Mg Tabs (Sumatriptan Succinate) .... One By Mouth As Needed Migraine (After Attempting Ibuprofen 800mg  First) 9)  Venlafaxine Hcl 225 Mg Xr24h-Tab (Venlafaxine Hcl) .... Take One Tablet Daily 10)  Hydroxyzine Hcl 50 Mg Tabs (Hydroxyzine Hcl) .... Take 1 Pill When You Are Itchy, Take 1 Pill Every Evening. 11)  Cephalexin 500 Mg Caps (Cephalexin) .... Take  One Pill Every 8 Hours For 7 Days. 12)  Flexeril 10 Mg Tabs (Cyclobenzaprine Hcl) .... Take One Tablet 3 Times A Day For Muscle Spasms 13)  Clobetasol Propionate 0.05 % Crea (Clobetasol Propionate) .... Use On Hair Loss Area Twice Daily 14)  Omeprazole 20 Mg Cpdr (Omeprazole) .... Take One Cp Daily 15)  Miralax  Powd (Polyethylene Glycol 3350) .... Take One Capful Daily. 16)  Xenical 120 Mg Caps (Orlistat) .... Take One Tablet When Eating Oily Foods. 17)  Trazodone Hcl 50 Mg Tabs (Trazodone Hcl) .... Take On Pill 15 Minutes Before You Are Ready To Go To Bed.  Allergies (verified): No Known Drug Allergies  Review of Systems        vitals reviewed and pertinent negatives and positives seen in HPI   Physical Exam  General:   Well-developed,well-nourished,in no acute distress; alert,appropriate and cooperative throughout examination Skin:  dark patches on her face. Partially covered by makeup.  Psych:  pt appears depressed. Talks in a low slow voice, does not express much joy in activities.    Impression & Recommendations:  Problem # 1:  DEPRESSION, MAJOR, RECURRENT, MODERATE (ICD-296.32) Assessment Deteriorated It appears as it she is not dealing well with the new stresses in her life. She also does not feel happy about anythig, she is not sleeping well. I will increase her dose of effexor to 225 mg daily.   Orders: [facility]- Est  Level 4 (13086)  Problem # 2:  INSOMNIA, CHRONIC (ICD-307.42) Assessment: Deteriorated PT is only sleepig about 4 hours a night. Plan to treat with Trazedone 50mg . Pt asked for Xanax and Valium by name but explained that these meds are addicting and we don't want to start them.   Orders: [facility]- Est  Level 4 (57846)  Problem # 3:  GERD (ICD-530.81) Assessment: New Pt is having reflux which causes her to have discomfort. Plan to try omeprazole. I belive she has had this before but we had no record of it.   Her updated medication list for this problem includes:    Omeprazole 20 Mg Cpdr (Omeprazole) .Marland Kitchen... Take one cp daily  Orders: [facility]- Est  Level 4 (96295)  Problem # 4:  OBESITY, NOS (ICD-278.00) Assessment: Unchanged Pt has not been able to lose weight. She is interested in speaking to a nutritionist. I recommended she speak to Dr. Gerilyn Pilgrim to come up with a plan, hopefully before the holidays.   Orders: [facility]- Est  Level 4 (99214)  Problem # 5:  HAIR LOSS (ICD-704.00) Assessment: New Pt says that she was being treated for hair loss by a dermatologist but doesn't have time to wait to get in to see them again. She was being treated with Clobetasol 0.05% cream and wants a refill on this. Pt given refill but scalp could not be visualized since the patient is AA and had a wig glued to her  head. ? cause of hair loss? could it be the wigs?   Complete Medication List: 1)  Advair Diskus 250-50 Mcg/dose Misc (Fluticasone-salmeterol) .... Inhale 1 puff as directed twice a day 2)  Albuterol 90 Mcg/act Aers (Albuterol) .... Inhale 1 puff every four to six hours 3)  Ambien 10 Mg Tabs (Zolpidem tartrate) .... Take 1 tablet by mouth at bedtime 4)  Synthroid 200 Mcg Tabs (Levothyroxine sodium) .... One by mouth daily 5)  Zyrtec Hives Relief 10 Mg Tabs (Cetirizine hcl) .... Take 1 tablet by mouth a day 6)  Ibuprofen 800 Mg Tabs (Ibuprofen) .Marland KitchenMarland KitchenMarland Kitchen  1 tablet every 8 hours for pain 7)  Albuterol Sulfate (2.5 Mg/83ml) 0.083% Nebu (Albuterol sulfate) .... Inhale 3ml every 4 hours as needed for dyspnea.  give supply for 60 doses. 8)  Sumatriptan Succinate 100 Mg Tabs (Sumatriptan succinate) .... One by mouth as needed migraine (after attempting ibuprofen 800mg  first) 9)  Venlafaxine Hcl 225 Mg Xr24h-tab (Venlafaxine hcl) .... Take one tablet daily 10)  Hydroxyzine Hcl 50 Mg Tabs (Hydroxyzine hcl) .... Take 1 pill when you are itchy, take 1 pill every evening. 11)  Cephalexin 500 Mg Caps (Cephalexin) .... Take one pill every 8 hours for 7 days. 12)  Flexeril 10 Mg Tabs (Cyclobenzaprine hcl) .... Take one tablet 3 times a day for muscle spasms 13)  Clobetasol Propionate 0.05 % Crea (Clobetasol propionate) .... Use on hair loss area twice daily 14)  Omeprazole 20 Mg Cpdr (Omeprazole) .... Take one cp daily 15)  Miralax Powd (Polyethylene glycol 3350) .... Take one capful daily. 16)  Xenical 120 Mg Caps (Orlistat) .... Take one tablet when eating oily foods. 17)  Trazodone Hcl 50 Mg Tabs (Trazodone hcl) .... Take on pill 15 minutes before you are ready to go to bed.  Patient Instructions: 1)  You have many new Rx to pick up.  2)  I want to see you back in February.  3)  Have a wonderful holiday season . Prescriptions: FLEXERIL 10 MG TABS (CYCLOBENZAPRINE HCL) take one tablet 3 times a day for muscle  spasms  #90 x 2   Entered and Authorized by:   Jamie Brookes MD   Signed by:   Jamie Brookes MD on 03/04/2009   Method used:   [facility]        [facility] [address] [phone]* (retail)       [address]       [place], [place]  [address]       Ph: [phone] or [phone]       Fax: [phone]   RxID:   [id number] SUMATRIPTAN SUCCINATE 100 MG TABS (SUMATRIPTAN SUCCINATE) one by mouth as needed migraine (after attempting ibuprofen 800mg  first)  #20 x 2   Entered and Authorized by:   Jamie Brookes MD   Signed by:   Jamie Brookes MD on 03/04/2009   Method used:   [facility] to        [facility] [address] [phone]* (retail)       [address]       [place], [place]  [address]       Ph: [phone] or [phone]       Fax: [phone]   RxID:   [id number] VENLAFAXINE HCL 225 MG XR24H-TAB (VENLAFAXINE HCL) take one tablet daily  #31 x 6   Entered and Authorized by:   Jamie Brookes MD   Signed by:   Jamie Brookes MD on 03/03/2009   Method used:   Electronically to        [facility] [address] [phone]* (retail)       [address]       [place], [place]  [address]       Ph: [phone] or [phone]       Fax: [phone]   RxID:   [id number] TRAZODONE HCL 50 MG TABS (TRAZODONE HCL) take on pill 15 minutes before you are ready to go  to bed.  #31 x 3   Entered and Authorized by:   Jamie Brookes MD   Signed by:   Jamie Brookes MD on 03/03/2009   Method used:   Electronically to        [facility] [address] [phone]* (retail)       [address]       [place], [place]  [address]       Ph: [phone] or [phone]       Fax: [phone]   RxID:   [id number] XENICAL 120 MG CAPS (ORLISTAT) take one tablet when eating oily foods.  #60 x 2   Entered and Authorized by:   Jamie Brookes MD   Signed by:   Jamie Brookes MD on 03/03/2009   Method used:    Electronically to        [facility] [address] [phone]* (retail)       [address]       [place], [place]  [address]       Ph: [phone] or [phone]       Fax: [phone]   RxID:   [id number] MIRALAX  POWD (POLYETHYLENE GLYCOL 3350) take one capful daily.  #1 x 6   Entered and Authorized by:   Jamie Brookes MD   Signed by:   Jamie Brookes MD on 03/03/2009   Method used:   Electronically to        [facility] [address] [phone]* (retail)       [address]       [place], [place]  [address]       Ph: [phone] or [phone]       Fax: [phone]   RxID:   [id number] OMEPRAZOLE 20 MG CPDR (OMEPRAZOLE) take one cp daily  #31 x 11   Entered and Authorized by:   Jamie Brookes MD   Signed by:   Jamie Brookes MD on 03/03/2009   Method used:   Electronically to        [facility] [address] [phone]* (retail)       [address]       [place], [place]  [address]       Ph: [phone] or [phone]       Fax: [phone]   RxID:   [id number] CLOBETASOL PROPIONATE 0.05 % CREA (CLOBETASOL PROPIONATE) use on hair loss area twice daily  #1 x 4   Entered and Authorized by:   Jamie Brookes MD   Signed by:   Jamie Brookes MD on 03/03/2009   Method used:   Electronically to        [facility] [address] [phone]* (retail)       [address]       [place], [place]  [address]       Ph: [phone] or [phone]       Fax: [phone]   RxID:   [id number]

## 2010-04-21 NOTE — Assessment & Plan Note (Signed)
Summary: RESCHEDULE F/U [facility]   Allergies: No Known Drug Allergies   Complete Medication List: 1)  Zoloft 100 Mg Tabs (Sertraline hcl) .... 1.5 tablets daily (per mental health) 2)  Advair Diskus 250-50 Mcg/dose Misc (Fluticasone-salmeterol) .... Inhale 1 puff as directed twice a day 3)  Albuterol 90 Mcg/act Aers (Albuterol) .... Inhale 1 puff every four to six hours 4)  Ambien 10 Mg Tabs (Zolpidem tartrate) .... Take 1 tablet by mouth at bedtime 5)  Synthroid 200 Mcg Tabs (Levothyroxine sodium) .... One by mouth daily 6)  Zyrtec Hives Relief 10 Mg Tabs (Cetirizine hcl) .... Take 1 tablet by mouth at bedtime 7)  Ibuprofen 800 Mg Tabs (Ibuprofen) .Marland Kitchen.. 1 tablet every 8 hours for pain 8)  Albuterol Sulfate (2.5 Mg/93ml) 0.083% Nebu (Albuterol sulfate) .... Inhale 3ml every 4 hours as needed for dyspnea.  give supply for 60 doses. 9)  Sumatriptan Succinate 100 Mg Tabs (Sumatriptan succinate) .... One by mouth as needed migraine (after attempting ibuprofen 800mg  first) 10)  Venlafaxine Hcl 37.5 Mg Tabs (Venlafaxine hcl) .... Tkae 1 pill twice daily for 4 days, then increase then one in the evening until taking 3 in the morning and 3 in the evening. 11)  Hydroxyzine Hcl 50 Mg Tabs (Hydroxyzine hcl) .... Take 1 pill when you are itchy, take 1 pill every evening. 12)  Cephalexin 500 Mg Caps (Cephalexin) .... Take one pill every 8 hours for 7 days.  Other Orders: No Charge Patient Arrived (NCPA0) (NCPA0)

## 2010-04-21 NOTE — Miscellaneous (Signed)
Summary: ED Report   Clinical Lists Changes   MRN:  [id number]        Account #: [id number]   Name: Jade Mathis, Jade Mathis       Sex: F   Age: 35         DOB: [birth year]   Complaint: Facial pain       Primary   Diagnosis:  Trigeminal            neuralgia   Arrival Time: 07/24/2008 20:35      Discharge Time: 07/24/2008 21:16   All Providers: Mr. Modesto Charon - PA   --------------------------------------------------------------------------------------         PROVIDER: Mr. Modesto Charon - PA        HPI:       The patient is a 35 year old female who presents with a chief complaint of facial       pain.  The history was provided by the patient. The patient was seen at 08:47 PM.       pt c/o left sided facial pain that began about a  week  ago,  denies  any  hx  of       migraines or toothaches, denies fever or chills only pain The facial pain started       1  week(s) ago. The facial pain started a week ago. The onset was acute. The [employer] is episodic. The Course is worsening. The facial pain is located in the left       cheek, left eye, left forehead and left jaw. The  facial pain has  no  radiation.       It is characterized as sharp and soreness. The patient's pain was 10 out of 10 at       its worst. The patient's pain is 10 out of 10 now. The symptoms are described  as       moderate.  The  condition  is aggravated by nothing. The condition is relieved by       nothing. The symptoms have been associated with no other complaints. The  patient       was treated prior to ED evaluation by no one.     21:03 07/24/2008 by Modesto Charon - PA, Mr.           ROS:       Constitutional: all Negative       Eyes: all Negative       ENMT:  NOTE - facial pain left side        NOTE - facial pain left side       Cardiovascular: all Negative       Respiratory: all Negative       Gastrointestinal: all Negative       Genitourinary: all Negative       Gynecologic: all Negative    Musculoskeletal: all Negative       Skin: all Negative       Neuro: all Negative       Psychiatric: all Negative       Metabolic: all Negative       Hematologic: all Negative       Allergic: all Negative     21:03 07/24/2008 by Modesto Charon - PA, Mr.           PMH:       Documentation: physician assistant reviewed/amended  Historian: patient, prior chart          +--------------------------------------------------------------------------------+       +         Patient's Current Physicians        +       +--------------------------------------------------------------------------------+       +   Patient's Current Physicians (please list       +       +   PCP first)           +       +--------------------------------------------------------------------------------+       +   [facility],          +       +--------------------------------------------------------------------------------+          Past medical history: asthma, hypothyroidism, myasthenia gravis       Surgical History: none       Social History: non-smoker, non-drinker, no drug abuse       Contraception: iUD       Immunization status: tetanus unknown       Special Needs: no barriers to learning          +--------------------------------------------------------------------------------+       +     Allergies         +       +-----------------------+---------------------------+----------------------------+       +     Drug     +       Reaction  +   Allergy Note      +       +-----------------------+---------------------------+----------------------------+       +    Vicodin     +       nausea  +        +       +-----------------------+---------------------------+----------------------------+     21:03 07/24/2008 by Modesto Charon - PA, Mr.           Physical examination:       Vital signs and O2 SAT: reviewed, normal       Constitutional: well developed,  well  nourished,  well  hydrated,  Uncomfortable       appearing       Head and Face: normocephalic, atraumatic       Eyes: normal appearance, EOMI, PERRL       ENMT: ears, nose and throat normal, mouth and pharynx normal, good dentition NOTE       - moderate tenderness to left side of face and trigeminal nerve          Neck: supple, full range of motion, no meningeal signs, no lymphadenopathy       Spine: cervical spine non-tender       Cardiovascular: regular rate and rhythm, no murmur, rub, or gallop       Respiratory: normal, breath sounds clear  T  equal bilaterally, no rales,  rhonchi,       wheezes, or rub       Chest: nontender, no deformity, movement normal       Extremities:  normal,  no abnormalities, no deformity, full range of motion, neu-       rovascularly intact, pulses normal, no tenderness, no edema       Neuro: AA T Ox3, Cranial Nerves II-XII intact, motor intact in  all  extremities,       sensation normal , gait normal, normal coordination, normal speech       Skin:  color normal, no rash, no petechiae, warm, dry       Psychiatric: AA T Ox4, tearful     21:03 07/24/2008 by Modesto Charon - PA, Mr.           Medication disposition:          +--------------------------------------------------------------------------------+       +           Medications         +       +-------------+--------------+-----------+-----------+-------------+-------------+       + Medication  +[Medication]  +Frequency  +Last Dose  + Medication  + PCP contact +       +    +Dosage  +      +   + disposition +      +       +-------------+--------------+-----------+-----------+-------------+-------------+       +Synthroid   +   +      +   + continue    +      +       +Oral   +   +      +   +        +      +       +-------------+--------------+-----------+-----------+-------------+-------------+       +Multi-Vita-  +   +      +   + continue    +      +       +mins Oral   +   +      +   +        +      +        +-------------+--------------+-----------+-----------+-------------+-------------+     20:50 07/24/2008 by Modesto Charon - PA, Mr.           Documentation completed by Responsible Physician     21:03 07/24/2008 by Modesto Charon - PA, Mr.           Prescriptions:            +------------------------------------------------------------------------------+         +           Prescription         +         +----------------------------+--------------------+----------------------------+         +        Medication    +  Dispense +    Sig Line      +         +----------------------------+--------------------+----------------------------+         +   Darvocet-N 100 100    +  20 twenty +    1-2 tablets by mouth    +         +   mg-650 mg Tab     +   +    every 4-6 hours as      +         +       +   +    needed for pain      +         +----------------------------+--------------------+----------------------------+         +   PredniSONE 10 mg Tab    +  #21  +    take 6 tabs first      +         +       +   +  day, 5 tabs second      +         +       +   +    day, 4 tabs third      +         +        +   +    day, 3 tabs fourth      +         +       +   +    day, 2 tabs fifth      +         +       +   +    day, 1 tab sixth day    +         +       +   +    with food       +         +----------------------------+--------------------+----------------------------+            Drug interactions:       +----------------------------------------------------------------------------+    +         Drug interactions         +    +---------------+-------------------------+--------------+-------------------+    +  +     +   +       +    +---------------+-------------------------+--------------+-------------------+    +   Allergy +     Darvocet-N 100   +   +     Allergy to    +    +  +     Oral    +   +     VICOdin      +     +---------------+-------------------------+--------------+-------------------+     21:03 07/24/2008 by Modesto Charon - PA, Mr.           Discharge:       Discharge Instructions:         trigeminal neuralgia          Append a Note to Discharge Instructions: take medication as directed.  follow  up       with your doctor or return for worsening symptoms          +--------------------------------------------------------------------------------+       +      Referral/Appointment         +       +----------------------+--------------------+-------------------+----------------+       +  Refer Patient To:   +   Phone Number: +   Follow-up in    +  Appointment   +       +      +   +      +  Details:      +       +----------------------+--------------------+-------------------+----------------+       +  Family Practice    +   +      +       +       +  Center,     +   +      +       +       +----------------------+--------------------+-------------------+----------------+     21:03 07/24/2008 by Modesto Charon - PA, Mr.           Patient disposition:       Patient  disposition: Disch - Home       Primary Diagnosis: trigeminal neuralgia            Counseling: advised of diagnosis, advised of treatment plan,  advised  of  need         for  close follow-up, advised of need to return for worsening or changing symp-         toms     21:03 07/24/2008 by Modesto Charon - PA, Mr.

## 2010-04-21 NOTE — Progress Notes (Signed)
Summary: Triage   Phone Note Call from Patient Call back at [phone]   Summary of Call: wants to speak with rn about iud removal as it is causing problems. Initial call taken by: Haydee Salter,  February 10, 2008 4:23 PM  Follow-up for Phone Call        reports irreguar bleeding the month of November. sometimes spotting and sometimes heavy. on a heavy day uses maybe 4 pads. also has abdominal discomfort.  wants IUD removed. offered work in appointment tomorrow but she needs to come after 3:00 and no SDA availabe. appointment scheduled Monday 02/17/08 with pcp. Follow-up by: Theresia Lo RN,  February 10, 2008 4:48 PM

## 2010-04-21 NOTE — Miscellaneous (Signed)
Summary: levothyroxine refill   Clinical Lists Changes  Medications: Rx of SYNTHROID 200 MCG TABS (LEVOTHYROXINE SODIUM) one by mouth daily;  #30 x 6;  Signed;  Entered by: Jamie Brookes MD;  Authorized by: Jamie Brookes MD;  Method used: [facility] [address] [phone]*, [address] [place], [place], [place]  [address], Ph: [phone] or [phone], Fax: [phone]    Prescriptions: SYNTHROID 200 MCG TABS (LEVOTHYROXINE SODIUM) one by mouth daily  #30 x 6   Entered and Authorized by:   Jamie Brookes MD   Signed by:   Jamie Brookes MD on 06/02/2009   Method used:   Electronically to        [facility] [address] [phone]* (retail)       [address]       [place], [place]  [address]       Ph: [phone] or [phone]       Fax: [phone]   RxID:   [id number]

## 2010-04-21 NOTE — Progress Notes (Signed)
Summary: Triage   Phone Note Call from Patient Call back at [phone]   Reason for Call: Talk to Nurse Summary of Call: pt is requesting to speak with MD, sts she is still having problems, sts her chest is still hurting, it feels really tight and the breathing treatments aren't helping, wants to know if she should see her asthma doctor. Initial call taken by: Knox Royalty,  July 08, 2007 10:43 AM  Follow-up for Phone Call        LAST NEB WAS LAST NIGHT. states it did not help. finished the prednisone. declined appt. states she has been to [place] twice and is no better. states she is going to call her asthma doctor and see if he will see her. refused to take another neb today since it did not help yesterday.  wants pcp to call her. message to md Follow-up by: Golden Circle RN,  July 08, 2007 10:47 AM  Additional Follow-up for Phone Call Additional follow up Details #1::        called and left message on machine.  pt went to pulm today and was seen there.  informed ok to make followup appt since has not been seen by pcp in several months, but has been in [place] [facility] multiple times.  cxr showed no evidence of pna.  given Rx of augmentin by np at [facility].  pt to followup with them in 2 weeks with dr. Sherene Sires.   Additional Follow-up by: Wilhemina Bonito  MD,  July 10, 2007 3:01 PM

## 2010-04-21 NOTE — Progress Notes (Signed)
Summary: needs appt  Pt scheduled for Jan 13 @ 8:30 AM.   Phone Note Call from Patient Call back at [phone]   Caller: Patient Summary of Call: was told she needs a nutrition appt Initial call taken by: De Nurse,  March 04, 2009 3:35 PM

## 2010-04-21 NOTE — Progress Notes (Signed)
Summary: Triage   Phone Note Call from Patient Call back at [phone]   Caller: Patient Summary of Call: pt stating her head really hurting and would like to be seen today.  was seen Urgent Care on the 7th of may. Initial call taken by: Clydell Hakim,  Aug 04, 2008 9:37 AM  Follow-up for Phone Call        went to ED 07/24/08 & was given 2 injections. has not taken anything for this HA. currently the pain is 8/10. urged her to try tylenol or ibuprofen. appt at 1:30 with her pcp today Follow-up by: Golden Circle RN,  Aug 04, 2008 9:41 AM

## 2010-04-21 NOTE — Progress Notes (Signed)
Summary: phn msg   Phone Note Call from Patient   Caller: Patient Summary of Call: pt had to cancell appt for monday and resch - do not come in early Initial call taken by: De Nurse,  June 25, 2009 2:32 PM

## 2010-04-21 NOTE — Progress Notes (Signed)
Summary: Rx's from yesterdays visit   0Phone Note Call from Patient Call back at [phone]   Summary of Call: Pt states pharmacy never received ambien or prednisone from yesterdays visit.  Pt states that it is an emergency and we need to send it in ASAP.  [facility] [address]. Initial call taken by: Haydee Salter,  July 03, 2007 10:20 AM  Follow-up for Phone Call        have written script for Remus Loffler to be faxed to pharmacy.  also resent prednisone. Follow-up by: Eustaquio Boyden  MD,  July 03, 2007 2:44 PM      Prescriptions: AMBIEN 10 MG TABS (ZOLPIDEM TARTRATE) Take 1 tablet by mouth at bedtime  #20 x 1   Entered and Authorized by:   Eustaquio Boyden  MD   Signed by:   Eustaquio Boyden  MD on 07/03/2007   Method used:   [facility] ...       [facility]  [address] [facility] [address] [facility] [phone]*       [address]       [place], [place]  [address]       Ph: [phone] or [phone]       Fax: [phone]   RxID:   [id number] PREDNISONE 50 MG  TABS (PREDNISONE) take one by mouth qdaily x 3 days  #3 x 0   Entered and Authorized by:   Eustaquio Boyden  MD   Signed by:   Eustaquio Boyden  MD on 07/03/2007   Method used:   Electronically sent to ...       [facility] [address] [phone]*       [address]       [place], [place]  [address]       Ph: [phone] or [phone]       Fax: [phone]   RxID:   [id number]    [facility] Prednisone rx recieved.....................................................................ADINA GOULD  July 03, 2007 3:09 PM

## 2010-04-21 NOTE — Assessment & Plan Note (Signed)
Summary: Headahces, DM2   Vital Signs:  Patient profile:   35 year old female Height:      67 inches Weight:      208.6 pounds BMI:     32.79 Temp:     98.6 degrees F oral Pulse rate:   88 / minute BP sitting:   139 / 84  (left arm) Cuff size:   large  Vitals Entered By: Jimmy Footman, CMA (March 23, 2010 9:02 AM)  Primary Care Provider:  Jamie Brookes MD  CC:  Headache and DM2.  History of Present Illness: Headaches: Seen at [facility] clinic, going to start Botox, hasn't gotten any meds yet. Having a bad HA today. She says they have not prescribed any meds yet and she needs some pain medicine (Vicodin works for her). We discussed that I will not be prescribing meds once they start prescribing a pain medicine regimine. Recently went to ED for bad headache.   DM2: Not testing much. Doing well otherwise. She is checking her feet, she wants ther A1c checked today because she is starting school and doesn't think she will be able to get in here frequently in the next few months.   Current Medications (verified): 1)  Advair Diskus 250-50 Mcg/dose Misc (Fluticasone-Salmeterol) .... Inhale 1 Puff As Directed Twice A Day 2)  Albuterol 90 Mcg/act Aers (Albuterol) .... Inhale 1 Puff Every Four To Six Hours 3)  Synthroid 200 Mcg Tabs (Levothyroxine Sodium) .... One By Mouth Daily 4)  Zyrtec Hives Relief 10 Mg Tabs (Cetirizine Hcl) .... Take 1 Tablet By Mouth A Day 5)  Ibuprofen 800 Mg Tabs (Ibuprofen) .Marland Kitchen.. 1 Tablet Every 8 Hours For Pain 6)  Albuterol Sulfate (2.5 Mg/44ml) 0.083% Nebu (Albuterol Sulfate) .... Inhale 3ml Every 4 Hours As Needed For Dyspnea.  Give Supply For 60 Doses. 7)  Sumatriptan Succinate 100 Mg Tabs (Sumatriptan Succinate) .... One By Mouth As Needed Migraine (After Attempting Ibuprofen 800mg  First) 8)  Effexor Xr 150 Mg Xr24h-Cap (Venlafaxine Hcl) .... Take 1 Tablet By Mouth Qam W/ Food 9)  Flexeril 10 Mg Tabs (Cyclobenzaprine Hcl) .... Take One Tablet 3 Times A Day For Muscle  Spasms 10)  Clobetasol Propionate 0.05 % Crea (Clobetasol Propionate) .... Use On Hair Loss Area Twice Daily 11)  Omeprazole 20 Mg Cpdr (Omeprazole) .... Take One Cp Daily 12)  Miralax  Powd (Polyethylene Glycol 3350) .... Take One Capful Daily. 13)  Trazodone Hcl 50 Mg Tabs (Trazodone Hcl) .... Take On Pill 15 Minutes Before You Are Ready To Go To Bed. 14)  Metoclopramide Hcl 10 Mg Tabs (Metoclopramide Hcl) .... Take 1 Tablet About 30 Minutes Before Meals 15)  Promethazine Hcl 25 Mg Tabs (Promethazine Hcl) .... Take 1 Pill At Night For Nausea (Do Not Drive While Taking This Medicine) 16)  Topiramate 25 Mg Tabs (Topiramate) .... Take 3 Tablets Daily 17)  Onetouch Test  Strp (Glucose Blood) .... Testing 1 Time A Day 18)  Lancets  Misc (Lancets) .... Testing 1 Time A Day 19)  Onetouch Ultra System W/device Kit (Blood Glucose Monitoring Suppl) .... Testing 1 Time A Day For Diabetes Type 2 Without Complications 250.0 20)  Metformin Hcl 1000 Mg Tabs (Metformin Hcl) .Marland Kitchen.. 1 Tab Every Morning and 1 Tab Every Evening 21)  Hydrocodone-Acetaminophen 5-500 Mg Tabs (Hydrocodone-Acetaminophen) .Marland Kitchen.. 1 Tab By Mouth Every 8 Hours As Needed For Headache 22)  Valacyclovir Hcl 500 Mg Tabs (Valacyclovir Hcl) .... 2 Tabs Daily For Outbreak 23)  Alcohol Preps  Pads (Alcohol Swabs) .... Use 1 Alcohol Swab Every Time You Test Your Blood Sugar To Clean Off Your Finger.  Allergies (verified): No Known Drug Allergies  Physical Exam  General:  Well-developed,well-nourished,in no acute distress; alert,appropriate and cooperative throughout examination Lungs:  Normal respiratory effort, chest expands symmetrically. Lungs are clear to auscultation, no crackles or wheezes. Heart:  Normal rate and regular rhythm. S1 and S2 normal without gallop, murmur, click, rub or other extra sounds.  Diabetes Management Exam:    Foot Exam (with socks and/or shoes not present):       Sensory-Monofilament:          Left foot: normal           Right foot: normal       Inspection:          Left foot: normal          Right foot: normal       Nails:          Left foot: normal          Right foot: normal   Impression & Recommendations:  Problem # 1:  HEADACHE (ICD-784.0) Assessment Deteriorated Pt given shot of Toradol today in office. [facility]. Will be getting Botox injections soon. Rx for 5 Vicodin today but pt aware that she will not get any more.   Her updated medication list for this problem includes:    Ibuprofen 800 Mg Tabs (Ibuprofen) .Marland Kitchen... 1 tablet every 8 hours for pain    Sumatriptan Succinate 100 Mg Tabs (Sumatriptan succinate) ..... One by mouth as needed migraine (after attempting ibuprofen 800mg  first)    Hydrocodone-acetaminophen 5-500 Mg Tabs (Hydrocodone-acetaminophen) .Marland Kitchen... 1 tab by mouth every 8 hours as needed for headache    Vicodin 5-500 Mg Tabs (Hydrocodone-acetaminophen) .Marland Kitchen... Take 1 pill every 6 hours as needed for your worst headaches.  Orders: Ketorolac-Toradol 15mg  (J1885) [facility]- Est Level  3 (54098)  Problem # 2:  DIABETES MELLITUS II, UNCOMPLICATED (ICD-250.00) Assessment: Unchanged A1c 6.0, doing well, taking Metformin. Will recheck in 3-6 months.  Her updated medication list for this problem includes:    Metformin Hcl 1000 Mg Tabs (Metformin hcl) .Marland Kitchen... 1 tab every morning and 1 tab every evening  Orders: A1C-[facility] (11914) [facility]- Est Level  3 ([address])  Complete Medication List: 1)  Advair Diskus 250-50 Mcg/dose Misc (Fluticasone-salmeterol) .... Inhale 1 puff as directed twice a day 2)  Albuterol 90 Mcg/act Aers (Albuterol) .... Inhale 1 puff every four to six hours 3)  Synthroid 200 Mcg Tabs (Levothyroxine sodium) .... One by mouth daily 4)  Zyrtec Hives Relief 10 Mg Tabs (Cetirizine hcl) .... Take 1 tablet by mouth a day 5)  Ibuprofen 800 Mg Tabs (Ibuprofen) .Marland Kitchen.. 1 tablet every 8 hours for pain 6)  Albuterol Sulfate (2.5 Mg/68ml) 0.083% Nebu (Albuterol sulfate)  .... Inhale 3ml every 4 hours as needed for dyspnea.  give supply for 60 doses. 7)  Sumatriptan Succinate 100 Mg Tabs (Sumatriptan succinate) .... One by mouth as needed migraine (after attempting ibuprofen 800mg  first) 8)  Effexor Xr 150 Mg Xr24h-cap (Venlafaxine hcl) .... Take 1 tablet by mouth qam w/ food 9)  Flexeril 10 Mg Tabs (Cyclobenzaprine hcl) .... Take one tablet 3 times a day for muscle spasms 10)  Clobetasol Propionate 0.05 % Crea (Clobetasol propionate) .... Use on hair loss area twice daily 11)  Omeprazole 20 Mg Cpdr (Omeprazole) .... Take one cp daily 12)  Miralax Powd (  Polyethylene glycol 3350) .... Take one capful daily. 13)  Trazodone Hcl 50 Mg Tabs (Trazodone hcl) .... Take on pill 15 minutes before you are ready to go to bed. 14)  Metoclopramide Hcl 10 Mg Tabs (Metoclopramide hcl) .... Take 1 tablet about 30 minutes before meals 15)  Promethazine Hcl 25 Mg Tabs (Promethazine hcl) .... Take 1 pill at night for nausea (do not drive while taking this medicine) 16)  Topiramate 25 Mg Tabs (Topiramate) .... Take 3 tablets daily 17)  Onetouch Test Strp (Glucose blood) .... Testing 1 time a day 18)  Lancets Misc (Lancets) .... Testing 1 time a day 19)  Onetouch Ultra System W/device Kit (Blood glucose monitoring suppl) .... Testing 1 time a day for diabetes type 2 without complications 250.0 20)  Metformin Hcl 1000 Mg Tabs (Metformin hcl) .Marland Kitchen.. 1 tab every morning and 1 tab every evening 21)  Hydrocodone-acetaminophen 5-500 Mg Tabs (Hydrocodone-acetaminophen) .Marland Kitchen.. 1 tab by mouth every 8 hours as needed for headache 22)  Valacyclovir Hcl 500 Mg Tabs (Valacyclovir hcl) .... 2 tabs daily for outbreak 23)  Alcohol Preps Pads (Alcohol swabs) .... Use 1 alcohol swab every time you test your blood sugar to clean off your finger. 24)  Vicodin 5-500 Mg Tabs (Hydrocodone-acetaminophen) .... Take 1 pill every 6 hours as needed for your worst headaches.  Patient Instructions: 1)  Your A1c was  6.0. that is good.  2)  Keep up the good work.  3)  Test your fasting glucose every morning and write it down.  4)  Use the Vicodin for only your worst headaches.  5)  Make sure to keep your headache appointments because I can't give you Vicodin anymore now that they are going to be prescribing meds.  6)  I will see you in 3 months.  Prescriptions: VICODIN 5-500 MG TABS (HYDROCODONE-ACETAMINOPHEN) take 1 pill every 6 hours as needed for your worst headaches.  #5 x 0   Entered and Authorized by:   Jamie Brookes MD   Signed by:   Jamie Brookes MD on 03/23/2010   Method used:   Handwritten   RxID:   [id number] ALCOHOL PREPS  PADS (ALCOHOL SWABS) use 1 alcohol swab every time you test your blood sugar to clean off your finger.  #100 x 11   Entered and Authorized by:   Jamie Brookes MD   Signed by:   Jamie Brookes MD on 03/23/2010   Method used:   [facility]        [facility] [address] [phone]* (retail)       [address]       [place], [place]  [address]       Ph: [phone] or [phone]       Fax: [phone]   RxID:   [id number]    Orders Added: 1)  A1C-[facility] [83036] 2)  Ketorolac-Toradol 15mg  [J1885] 3)  [facility]- Est Level  3 [99213]    Laboratory Results   Blood Tests   Date/Time Received: March 23, 2010 9:03 AM  Date/Time Reported: March 23, 2010 9:32 AM   HGBA1C: 6.0%   (Normal Range: Non-Diabetic - 3-6%   Control Diabetic - 6-8%)  Comments: ...............test performed by......Marland KitchenBonnie A. [place], MLS (ASCP)cm       Prevention & Chronic Care Immunizations   Influenza vaccine: Not documented    Tetanus booster: 08/19/2003: Done.    Pneumococcal vaccine: Not documented  Other Screening  Pap smear: NEGATIVE FOR INTRAEPITHELIAL LESIONS OR MALIGNANCY.  (01/20/2010)   Smoking status: never  (01/20/2010)  Diabetes Mellitus   HgbA1C: 6.0  (03/23/2010)    Eye exam: Not documented    Foot exam: yes   (03/23/2010)   High risk foot: Not documented   Foot care education: Not documented    Urine microalbumin/creatinine ratio: Not documented    Diabetes flowsheet reviewed?: Yes   Progress toward A1C goal: At goal  Lipids   Total Cholesterol: 114  (03/21/2006)   LDL: 72  (03/21/2006)   LDL Direct: Not documented   HDL: 35  (03/21/2006)   Triglycerides: 36  (03/21/2006)  Self-Management Support :   Personal Goals (by the next clinic visit) :     Personal A1C goal: 6  (03/23/2010)     Personal blood pressure goal: 130/80  (03/23/2010)     Personal LDL goal: 100  (03/23/2010)    Patient will work on the following items until the next clinic visit to reach self-care goals:     Medications and monitoring: take my medicines every day, check my blood sugar, examine my feet every day  (03/23/2010)     Eating: eat more vegetables, use fresh or frozen vegetables  (03/23/2010)    Diabetes self-management support: Written self-care plan  (03/23/2010)   Diabetes care plan printed   Appended Document: Headahces, DM2   Medication Administration  Injection # 1:    Medication: Ketorolac-Toradol 15mg     Diagnosis: HEADACHE (ICD-784.0)    Route: IM    Site: LUOQ gluteus    Exp Date: 07/18/2011    Lot #: [id number]    Mfr: [email or web address]    Patient tolerated injection without complications    Given by: Loralee Pacas CMA (March 23, 2010 2:49 PM)  Orders Added: 1)  Ketorolac-Toradol 15mg  [H0865]

## 2010-04-21 NOTE — Assessment & Plan Note (Signed)
Summary: read tb/eo   Nurse Visit patient in for PPD reading . read by Dr. Tressia Danas.   Allergies: No Known Drug Allergies  PPD Results    Date of reading: 04/15/2010    Results: 0 mm    Interpretation: negative  Orders Added: 1)  No Charge Patient Arrived (NCPA0) [NCPA0]

## 2010-04-21 NOTE — Miscellaneous (Signed)
Summary: DNKA-wants IUD removed   Clinical Lists Changes

## 2010-04-21 NOTE — Progress Notes (Signed)
   Phone Note Call from Patient Call back at [phone]   Caller: Patient Summary of Call: Patient calls c/o ST, HA, congestion.  Denies SOB.  Reports mild chest tightness relieved by Albuterol (has Asthma).  Increased fatigue.  Took claritin this am which helped symptoms some, but now worse again.  Advised to go to Urgent Care right away - has transportation - for further evaluation.  ED if cannot get to UC.  Patient in agreement with plan. Initial call taken by: Drue Dun MD,  June 21, 2007 8:34 PM

## 2010-04-21 NOTE — Assessment & Plan Note (Signed)
Summary: tb test read,tcb   Nurse Visit   Allergies: No Known Drug Allergies  PPD Results    Date of reading: 12/16/2008    Results: < 5mm    Interpretation: negative Dennison Nancy RN  December 16, 2008 4:27 PM  Orders Added: 1)  No Charge Patient Arrived (NCPA0) [NCPA0]

## 2010-04-21 NOTE — Assessment & Plan Note (Signed)
Summary: CPP with pap, HA's   KETOROLAC 60MG  GIVEN  TODAY.Jimmy Footman, CMA  January 20, 2010 12:18 PM   Vital Signs:  Patient profile:   35 year old female Height:      67 inches Weight:      209.2 pounds BMI:     32.88 Temp:     98.5 degrees F oral Pulse rate:   94 / minute BP sitting:   130 / 81  (left arm) Cuff size:   regular  Vitals Entered By: Garen Grams LPN (January 20, 2010 11:20 AM) CC: CPP w/ pap, HA's Is Patient Diabetic? Yes Did you bring your meter with you today? No Pain Assessment Patient in pain? yes     Location: headache   Primary Care [facility]:  Jamie Brookes MD  CC:  CPP w/ pap and HA's.  History of Present Illness: Pt is here for a full physcial exam today.  Since I saw her last she has had another flair of Myastenia Gravis starting on Aug 17th. She has to have 5 sessions of plasmaphoresis and was started on Cellcept until it made her skin break out in a rash on her face and neck. she was recently switched to Imuran and is on  Mestanon as well. She is being managed by Dr. Anne Hahn (was being see by Dr. Sharene Skeans but recently was switched to Dr. Anne Hahn).  Things have calmed down on the MG but she is still having daily headaches.  Headaches: Pt has daily headaches. The are migraines and she has tried Topamax( 75 mg /day), Sumatriptan (4 times a week), Ibuprofen (1600 mg a day) and Goody's powders (1-2 times a week) for them. Nothing seems to make them totally better.   Habits & Providers  Alcohol-Tobacco-Diet     Tobacco Status: never  Current Medications (verified): 1)  Advair Diskus 250-50 Mcg/dose Misc (Fluticasone-Salmeterol) .... Inhale 1 Puff As Directed Twice A Day 2)  Albuterol 90 Mcg/act Aers (Albuterol) .... Inhale 1 Puff Every Four To Six Hours 3)  Synthroid 200 Mcg Tabs (Levothyroxine Sodium) .... One By Mouth Daily 4)  Zyrtec Hives Relief 10 Mg Tabs (Cetirizine Hcl) .... Take 1 Tablet By Mouth A Day 5)  Ibuprofen 800 Mg Tabs  (Ibuprofen) .Marland Kitchen.. 1 Tablet Every 8 Hours For Pain 6)  Albuterol Sulfate (2.5 Mg/43ml) 0.083% Nebu (Albuterol Sulfate) .... Inhale 3ml Every 4 Hours As Needed For Dyspnea.  Give Supply For 60 Doses. 7)  Sumatriptan Succinate 100 Mg Tabs (Sumatriptan Succinate) .... One By Mouth As Needed Migraine (After Attempting Ibuprofen 800mg  First) 8)  Effexor Xr 150 Mg Xr24h-Cap (Venlafaxine Hcl) .... Take 1 Tablet By Mouth Qam W/ Food 9)  Flexeril 10 Mg Tabs (Cyclobenzaprine Hcl) .... Take One Tablet 3 Times A Day For Muscle Spasms 10)  Clobetasol Propionate 0.05 % Crea (Clobetasol Propionate) .... Use On Hair Loss Area Twice Daily 11)  Omeprazole 20 Mg Cpdr (Omeprazole) .... Take One Cp Daily 12)  Miralax  Powd (Polyethylene Glycol 3350) .... Take One Capful Daily. 13)  Trazodone Hcl 50 Mg Tabs (Trazodone Hcl) .... Take On Pill 15 Minutes Before You Are Ready To Go To Bed. 14)  Metoclopramide Hcl 10 Mg Tabs (Metoclopramide Hcl) .... Take 1 Tablet About 30 Minutes Before Meals 15)  Promethazine Hcl 25 Mg Tabs (Promethazine Hcl) .... Take 1 Pill At Night For Nausea (Do Not Drive While Taking This Medicine) 16)  Topiramate 25 Mg Tabs (Topiramate) .... Take 1 Pill Daily To  Prevent Migraines For 1 Week, Then Increase To 2 Pills Daily For 1 Weeks, Then 2 Pills Every Morning and Every Evening 17)  Onetouch Test  Strp (Glucose Blood) .... Testing 1 Time A Day 18)  Lancets  Misc (Lancets) .... Testing 1 Time A Day 19)  Onetouch Ultra System W/device Kit (Blood Glucose Monitoring Suppl) .... Testing 1 Time A Day For Diabetes Type 2 Without Complications 250.0 20)  Metformin Hcl 1000 Mg Tabs (Metformin Hcl) .Marland Kitchen.. 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 No apparent alopecia or balding. Eyes:  No corneal or conjunctival inflammation noted. EOMI. Perrla. Vision grossly normal. Ears:  External ear exam shows no significant lesions or deformities.  Otoscopic examination reveals clear canals, tympanic membranes are intact bilaterally without bulging, retraction, inflammation or discharge. Hearing is grossly normal bilaterally. Nose:  External nasal examination shows no deformity or inflammation. Nasal mucosa are pink and moist without lesions or exudates. Mouth:  Oral mucosa and oropharynx without lesions or exudates.  Teeth in good repair. Neck:  No deformities, masses, or tenderness noted. Lungs:  Normal respiratory effort, chest expands symmetrically. Lungs are clear to auscultation, no crackles or wheezes. Heart:  Normal rate and regular rhythm. S1 and S2 normal without gallop, murmur, click, rub or other extra sounds. Abdomen:  Bowel sounds positive,abdomen soft and non-tender without masses, organomegaly or hernias noted. Genitalia:  Normal introitus for age, no external lesions, no vaginal discharge, mucosa pink and moist, no vaginal or cervical lesions, no vaginal atrophy, no friaility or hemorrhage, normal uterus size and position, no adnexal masses or tenderness Skin:  lots of papules on her face and neck. Hyperpigmented.  Psych:  pt's speech is slowed but that is her  baseline.    Impression & Recommendations:  Problem # 1:  PHYSICAL EXAMINATION (ICD-V70.0) Assessment Unchanged Pt is doing well considering all that she has been through lately. She is not exercising like she was several months ago but I expect that she will return to that. She has continued to lose weight. I suspect some of that is muscle loss.   Orders: [facility] - Est  18-39 yrs (16109)  Problem # 2:  MIGRAINE HEADACHE (ICD-346.90) Assessment: Deteriorated PT has chronic daily headaches as well as migraines. She is using Ibuprofen daily for the headaches and I suspect this is causing rebound headaches. She is also using Sumatriptan about 4 times a week. Topamax is not helping her. She would like to go to a HA clinic. Will refer. Will give Toradol shot today in clinic. Will give 5 Vicodin for her worst HA's to be used until she can be seen at the HA clinic.   Her updated medication list for this problem includes:    Ibuprofen 800 Mg Tabs (Ibuprofen) .Marland Kitchen... 1 tablet every 8 hours for pain    Sumatriptan Succinate 100 Mg Tabs (  Sumatriptan succinate) ..... One by mouth as needed migraine (after attempting ibuprofen 800mg  first)  Orders: Ketorolac-Toradol 15mg  (Z6109) [facility] - Est  18-39 yrs ([address]454) Headache Clinic Referral (Headache)  Complete Medication List: 1)  Advair Diskus 250-50 Mcg/dose Misc (Fluticasone-salmeterol) .... Inhale 1 puff as directed twice a day 2)  Albuterol 90 Mcg/act Aers (Albuterol) .... Inhale 1 puff every four to six hours 3)  Synthroid 200 Mcg Tabs (Levothyroxine sodium) .... One by mouth daily 4)  Zyrtec Hives Relief 10 Mg Tabs (Cetirizine hcl) .... Take 1 tablet by mouth a day 5)  Ibuprofen 800 Mg Tabs (Ibuprofen) .Marland Kitchen.. 1 tablet every 8 hours for pain 6)  Albuterol Sulfate (2.5 Mg/68ml) 0.083% Nebu (Albuterol sulfate) .... Inhale 3ml every 4 hours as needed for dyspnea.  give supply for 60 doses. 7)  Sumatriptan Succinate 100 Mg Tabs (Sumatriptan succinate) .... One  by mouth as needed migraine (after attempting ibuprofen 800mg  first) 8)  Effexor Xr 150 Mg Xr24h-cap (Venlafaxine hcl) .... Take 1 tablet by mouth qam w/ food 9)  Flexeril 10 Mg Tabs (Cyclobenzaprine hcl) .... Take one tablet 3 times a day for muscle spasms 10)  Clobetasol Propionate 0.05 % Crea (Clobetasol propionate) .... Use on hair loss area twice daily 11)  Omeprazole 20 Mg Cpdr (Omeprazole) .... Take one cp daily 12)  Miralax Powd (Polyethylene glycol 3350) .... Take one capful daily. 13)  Trazodone Hcl 50 Mg Tabs (Trazodone hcl) .... Take on pill 15 minutes before you are ready to go to bed. 14)  Metoclopramide Hcl 10 Mg Tabs (Metoclopramide hcl) .... Take 1 tablet about 30 minutes before meals 15)  Promethazine Hcl 25 Mg Tabs (Promethazine hcl) .... Take 1 pill at night for nausea (do not drive while taking this medicine) 16)  Topiramate 25 Mg Tabs (Topiramate) .... Take 3 tablets daily 17)  Onetouch Test Strp (Glucose blood) .... Testing 1 time a day 18)  Lancets Misc (Lancets) .... Testing 1 time a day 19)  Onetouch Ultra System W/device Kit (Blood glucose monitoring suppl) .... Testing 1 time a day for diabetes type 2 without complications 250.0 20)  Metformin Hcl 1000 Mg Tabs (Metformin hcl) .Marland Kitchen.. 1 tab every morning and 1 tab every evening  Other Orders: A1C-[facility] (09811) Pap Smear-[facility] (91478-29562) GC/Chlamydia-[facility] (87591/87491)  Patient Instructions: 1)  You are getting a Toradol shot today.  2)  You are getting referred to the [facility] center.  3)  You had a full physical today. We will call you with results.  ssPrescriptions: TOPIRAMATE 25 MG TABS (TOPIRAMATE) take 3 tablets daily  #90 x 3   Entered and Authorized by:   Jamie Brookes MD   Signed by:   Jamie Brookes MD on 01/20/2010   Method used:   [facility]        [facility] [address] [phone]* (retail)       [address]       [place], [place]  [address]       Ph: [phone] or  [phone]       Fax: [phone]   RxID:   [id number]    Medication Administration  Injection # 1:    Medication: Ketorolac-Toradol 15mg     Diagnosis: MIGRAINE HEADACHE (ICD-346.90)    Route: IM    Site: RUOQ gluteus    Exp Date: 10/19/2011    Lot #: [id number]    Mfr: baxter    Comments: 60mg  given  Patient tolerated injection without complications    Given by: Jimmy Footman, CMA (January 20, 2010 12:17 PM)  Orders Added: 1)  A1C-[facility] [83036] 2)  Ketorolac-Toradol 15mg  [J1885] 3)  Pap Smear-[facility] [[id number]] 4)  GC/Chlamydia-[facility] [87591/87491] 5)  [facility] - Est  18-39 yrs [99395] 6)  Headache Clinic Referral [Headache]    Laboratory Results   Blood Tests   Date/Time Received: January 20, 2010 11:25 AM  Date/Time Reported: January 20, 2010 12:09 PM   HGBA1C: 5.8%   (Normal Range: Non-Diabetic - 3-6%   Control Diabetic - 6-8%)  Comments: .......test performed by........Marland Kitchen Garen Grams, LPN entered by Terese Door, CMA

## 2010-04-21 NOTE — Progress Notes (Signed)
Summary: Return call   Phone Note Call from Patient Call back at [phone]   Summary of Call: pt states she is returning a call from Leonard from a couple of days ago. Initial call taken by: Haydee Salter,  February 26, 2008 10:06 AM  Follow-up for Phone Call        spoke with patient and scheduled appointment with Dr. Clelia Croft 03/25/08  for follow up on diabetes. suggested she come in sooner to have Hgb a1c done and she will check and see when she can come in and call back. Follow-up by: Theresia Lo RN,  February 26, 2008 11:51 AM

## 2010-04-21 NOTE — Assessment & Plan Note (Signed)
Summary: Seeing Jade Mathis @ 2:30 / [name]   Vital Signs:  Patient profile:   35 year old female Height:      66.5 inches Weight:      217.9 pounds BMI:     34.77  Vitals Entered By: Wyona Almas PHD (May 19, 2009 2:40 PM)  Primary Care Provider:  Jamie Brookes MD   History of Present Illness: Assessment: Spent 30 minutes with pt.  Jade Mathis was sick with the flu last week, so has been unable to exercise, and isn't yet eating normally.  She said she had made several changes prior to getting sick, however, such as getting a lot of veg's, planning ahead, and eating cooked oatmeal in the morning.  She has noticed that she is not so hungry at night if she includes a signficant protein portion at dinner.  Jade Mathis said she has been eating sweets too frequently in the past four days (Red Mango frozen yogurt w/ other sweets).  Nutrition Diagnosis: Impossible to assess progress on inadequate fiber intake (NI-53.5) related to insufficient veg's b/c pt is not eating normally since having the flu last week.    Intervention: See Patient Instructions.  Monitoring/Eval: Dietary intake, body weight, and exercise at 4-wk F/U.     Allergies: No Known Drug Allergies   Complete Medication List: 1)  Advair Diskus 250-50 Mcg/dose Misc (Fluticasone-salmeterol) .... Inhale 1 puff as directed twice a day 2)  Albuterol 90 Mcg/act Aers (Albuterol) .... Inhale 1 puff every four to six hours 3)  Ambien 10 Mg Tabs (Zolpidem tartrate) .... Take 1 tablet by mouth at bedtime 4)  Synthroid 200 Mcg Tabs (Levothyroxine sodium) .... One by mouth daily 5)  Zyrtec Hives Relief 10 Mg Tabs (Cetirizine hcl) .... Take 1 tablet by mouth a day 6)  Ibuprofen 800 Mg Tabs (Ibuprofen) .Marland Kitchen.. 1 tablet every 8 hours for pain 7)  Albuterol Sulfate (2.5 Mg/54ml) 0.083% Nebu (Albuterol sulfate) .... Inhale 3ml every 4 hours as needed for dyspnea.  give supply for 60 doses. 8)  Sumatriptan Succinate 100 Mg Tabs (Sumatriptan succinate) .... One  by mouth as needed migraine (after attempting ibuprofen 800mg  first) 9)  Venlafaxine Hcl 225 Mg Xr24h-tab (Venlafaxine hcl) .... Take one tablet daily 10)  Hydroxyzine Hcl 50 Mg Tabs (Hydroxyzine hcl) .... Take 1 pill when you are itchy, take 1 pill every evening. 11)  Cephalexin 500 Mg Caps (Cephalexin) .... Take one pill every 8 hours for 7 days. 12)  Flexeril 10 Mg Tabs (Cyclobenzaprine hcl) .... Take one tablet 3 times a day for muscle spasms 13)  Clobetasol Propionate 0.05 % Crea (Clobetasol propionate) .... Use on hair loss area twice daily 14)  Omeprazole 20 Mg Cpdr (Omeprazole) .... Take one cp daily 15)  Miralax Powd (Polyethylene glycol 3350) .... Take one capful daily. 16)  Xenical 120 Mg Caps (Orlistat) .... Take one tablet when eating oily foods. 17)  Trazodone Hcl 50 Mg Tabs (Trazodone hcl) .... Take on pill 15 minutes before you are ready to go to bed.  [facility]  Patient Instructions: 1)  Limit sweets to 100-calorie portions no more than once per day (with Red Mango yogurt treat a maximum of every other week).   2)  Obtain twice as many veg's as protein or carbohydrate foods for both lunch and dinner. 3)  Consume at least 6 8-oz cups of water per day.  Keep your water handy for first thing in the morning,  and always carry a water bottle with you.   4)  Do as much walking as you can (to make up for your sick week!).   5)  Schedule a follow-up appt for 3-4 weeks.   6)  Call Dr. Gerilyn Pilgrim if any Qs:  [phone].

## 2010-04-21 NOTE — Assessment & Plan Note (Signed)
Summary: give glucose monitor and instuctions/ls  Patient in office and was given One Touch Glucose monitor and instructions. BS was checked with this monitor with reading of 132 at 12:10 PM.  appointment has been scheduled with [facility] for 11/16/2009 at 10:00 AM. patient is aware. Lavaun Greenfield RN  November 08, 2009 12:21 PM  Nurse Visit   Allergies: No Known Drug Allergies  Orders Added: 1)  No Charge Patient Arrived (NCPA0) [NCPA0]

## 2010-04-21 NOTE — Progress Notes (Signed)
Summary: pls call  Pt rescheduled for Jan 31 at 2:30.  Phone Note Call from Patient Call back at [phone]   Caller: Patient Summary of Call: needs to talk to Dr Gerilyn Pilgrim before appt next week. Initial call taken by: De Nurse,  March 24, 2009 9:50 AM

## 2010-04-21 NOTE — Miscellaneous (Signed)
Summary: refill on ambien requested   Clinical Lists Changes   patient in office for suture removal and requests refill  on  Ambien.  [facility]. advised will send message to MD. Theresia Lo RN  January 21, 2009 4:55 PM  done.sent back to [place].  Eustaquio Boyden  MD  January 21, 2009 5:28 PM   Prescriptions: AMBIEN 10 MG TABS (ZOLPIDEM TARTRATE) Take 1 tablet by mouth at bedtime  #20 x 0   Entered and Authorized by:   Eustaquio Boyden  MD   Signed by:   Eustaquio Boyden  MD on 01/21/2009   Method used:   Printed then faxed to ...       [facility] [address] [phone]* (retail)       [address]       [place], [place]  [address]       Ph: [phone] or [phone]       Fax: [phone]   RxID:   [id number]   Appended Document: refill on ambien requested message left on voicemail that Rx has been sent in. called pharmacy and verbally left  rx on their voicemail in case fax did not go through.

## 2010-04-21 NOTE — Progress Notes (Signed)
Summary: Synthroid refilled.   Phone Note Refill Request Call back at [facility] Phone [phone] Message from:  Patient  Refills Requested: Medication #1:  SYNTHROID 200 MCG TABS one by mouth daily [facility] [address]  Initial call taken by: Clydell Hakim,  June 09, 2009 3:15 PM    Prescriptions: SYNTHROID 200 MCG TABS (LEVOTHYROXINE SODIUM) one by mouth daily  #31 x 6   Entered and Authorized by:   Jamie Brookes MD   Signed by:   Jamie Brookes MD on 06/10/2009   Method used:   [facility]ronically [facility]        [facility] [address] [phone]* (retail)       [address]       [place], [place]  [address]       Ph: [phone] or [phone]       Fax: [phone]   RxID:   [id number]

## 2010-04-21 NOTE — Progress Notes (Signed)
Summary: need asap   Phone Note Call from Patient Call back at Home Phone [phone]   Caller: Patient Summary of Call: pt saw Jones this AM and was told that her Albuterol liquid would be called in today.  She says the pharmacy still hasn't rec'd it and is very concerned. [facility] [facility] to let her know. Initial call taken by: De Nurse,  March 04, 2008 4:23 PM  Follow-up for Phone Call        will forward to MD to please do before end of day.  Follow-up by: Theresia Lo RN,  March 04, 2008 4:26 PM  Additional Follow-up for Phone Call Additional follow up Details #1::        Rx just sent Additional Follow-up by: Sylvan Cheese MD,  March 04, 2008 4:37 PM

## 2010-04-21 NOTE — Progress Notes (Signed)
Summary: Possible rx forgery    ---- Converted from flag ---- ---- 03/24/2010 11:40 AM, Jimmy Footman, CMA wrote: [facility] by the pt. Pharmacist states that rx was altered to say #50 x18. I checked the rx prescribed by Dr. Clotilde Dieter and it was for #5 x0 and read this to her. Pharmacist is going to call the police due to the rx being altered by the pt. I spoke with [name] about this and was advised to send this to PCP, Dennison Nancy, and Dr. Deirdre Priest for possible dismissal of the pt. The rx is for Vicodin 5-500 tabs. ------------------------------ I placed [facility] Controlled Substance Database query for Ms Jade Mathis covering the last 1 year in Dr Lorelee Market box. Todd McDiarmid MD  March 28, 2010 10:31 AM

## 2010-04-21 NOTE — Progress Notes (Signed)
Summary: meds prob   Phone Note Outgoing Call Call back at [facility] Phone [phone]   Call placed by: Jimmy Footman, CMA,  January 24, 2010 8:32 AM Call placed to: Patient Summary of Call: pt states that she lost rx prescribed on friday Initial call taken by: Jimmy Footman, CMA,  January 24, 2010 8:33 AM  Follow-up for Phone Call        pt is really in need of this asap Follow-up by: De Nurse,  January 25, 2010 10:26 AM  Additional Follow-up for Phone Call Additional follow up Details #1::        message left to return call.   spoke with Dr. Mauricio Po and he advises he will not give another RX at this time. will  have to wait for Dr. Lelon Perla to respond as to whether he will give another RX. Additional Follow-up by: Theresia Lo RN,  January 25, 2010 10:31 AM    Additional Follow-up for Phone Call Additional follow up Details #2::    pt returned call Follow-up by: De Nurse,  January 25, 2010 10:49 AM  spoke with patient and she thinks she lost her Rx Friday as she was running in the rain to get to her car.  she does have a headache now. states she has headaches every day. paged Dr.Saunders and he will not refill rx.  states patient needs to come back in to see PCP. patient notified. she states she just saw Dr. Clotilde Dieter on last Thursday and she wants me to ask her if she will refill. will forward message to MD. Theresia Lo RN  January 25, 2010 11:00 AM   I gave her a Rx for 5 vicodin less than 5 days ago to be used only for the worst headaches. They were to last her until she is seen at the [facility]. My understanding is that the [facility] center is going to try to get her off meds like Vicodin anyways so I'm not willing to prescribe more at this time. Jamie Brookes MD  January 25, 2010 12:59 PM   called patient and gave message from MD. patient hung up . I called back not sure if we just got disconncted. she stated she doesn't want to hear it and hung up  again. Jancie Kercher RN  January 25, 2010 1:41 PM

## 2010-04-21 NOTE — Progress Notes (Signed)
Summary: re: results  ---- Converted from flag ---- ---- 01/24/2010 11:16 AM, Jamie Brookes MD wrote: Please let her know that her pap smear was normal. ------------------------------  patient informed of normal pap

## 2010-04-21 NOTE — Progress Notes (Signed)
Summary: referral   Phone Note Call from Patient Call back at Home Phone [phone]   Caller: Patient Summary of Call: yesterday fasting BS- 340  [facility] [address]   Initial call taken by: De Nurse,  November 08, 2009 10:08 AM  Follow-up for Phone Call        called [facility] and they did not receive referral faxed 11/01/2009. faxed again  now and they will call patient today to schedule.  patient states yesteday at dialysis fasting  BS was 340 and she had not checked it since because she does not have a monitor. states pharmacy was going to charge her for meter and she could not afford. advised her to come to our office now and I will give her a monitor . the strips and lancets have been sent to pharmacy already and she knows the cost of those and she will get. they day before yesterday she ate supper at 9:00 PM. and this may have contributed to elevated fasting BS yestterday  . she will come in today for free montior. Follow-up by: Theresia Lo RN,  November 08, 2009 11:59 AM      Vital Signs:  Patient profile:   35 year old female Height:      67 inches  patient came to office and One Touch Glucose Monitor given to her with instructions. BS checked in office with reading of 132. appointment [facility] been [facility] for 11/16/2009 at 10:00 AM. Theresia Lo RN  November 08, 2009 12:24 PM  Appended Document: referral [facility] on 11-16-09.

## 2010-04-21 NOTE — Progress Notes (Signed)
Summary: Requesting to speak with rn   Phone Note Call from Patient Call back at [phone]   Summary of Call: would like to speak with Kennon Rounds - states "it is personal" Initial call taken by: Haydee Salter,  October 11, 2006 2:24 PM  Follow-up for Phone Call        left message Follow-up by: Golden Circle RN,  October 12, 2006 8:27 AM

## 2010-04-21 NOTE — Progress Notes (Signed)
Summary: wi request  Phone Note Call from Patient Call back at Home Phone [phone] Call back at [phone]   Reason for Call: Talk to Nurse Summary of Call: pt is requesting wi appt, she was seen at the hospital over the weekend & needs to fu with her pcp asap b/c she's not feeling any better Initial call taken by: ERIN LEVAN,  August 20, 2006 8:51 AM  Follow-up for Phone Call        left message Follow-up by: Golden Circle RN,  August 20, 2006 9:00 AM  Additional Follow-up for Phone Call Additional follow up Details #1::        went to er yesterday & was diagnosed with bronchitis. wants appt to f/u with pcp as she is overdue for her 3 month check. appt made Additional Follow-up by: Golden Circle RN,  August 20, 2006 9:22 AM

## 2010-04-21 NOTE — Assessment & Plan Note (Signed)
Summary: PPD Placement   Nurse Visit   Allergies: No Known Drug Allergies  Immunizations Administered:  PPD Skin Test:    Vaccine Type: PPD    Site: right forearm    Mfr: mantoux    Dose: 0.1 ml    Route: ID    Given by: Alphia Kava    Exp. Date: 07/14/2011    Lot #: [id number]  Orders Added: 1)  TB Skin Test [86580] 2)  Admin 1st Vaccine [phone]

## 2010-04-21 NOTE — Miscellaneous (Signed)
Summary: medical record request  Clinical Lists Changes  Rec'd medical record request to be faxed to Disability determination services faxed 11/26/09 Marily Memos  February 16, 2010 10:08 AM

## 2010-04-21 NOTE — Assessment & Plan Note (Signed)
Summary: DM, Wt/obesity, Asthma, Left leg spasm, HA, Depression   Vital Signs:  Patient profile:   35 year old female Weight:      215.1 pounds BMI:     34.32 Temp:     97.0 degrees F oral Pulse rate:   84 / minute BP sitting:   113 / 72  (left arm)  Vitals Entered By: Alphia Kava (December 21, 2008 8:34 AM)  Nutrition Counseling: Patient's BMI is greater than 25 and therefore counseled on weight management options. CC: DM, weight, asthma, left leg spasm, headaches, Depression Is Patient Diabetic? Yes   Primary Care Provider:  Jamie Brookes MD  CC:  DM, weight, asthma, left leg spasm, headaches, and Depression.  History of Present Illness: DM2:Pt has a strong family histroy of diabetes and has had one borderline elevated fasting glucose. She has had an A1c a year ago that was 5.8 but it has increased to 6.5 today. She is currently considered borderline diabetes and encouraged to start exercising.   Weight/obesity:Pt says that she has gained 10 lbs  since she had her leg in a cas (sprain). She now has her leg out of a cast and has begun exercising about 45 min a day. She is determined to lose the weight. She says that she is not afraid of the cold months and that she is determined enough to exercise regularily. We calculated her BMI and patient was given a handout about how to calculate BMI, what it means and what the different categories are.   Asthma:Pt says her asthma is worsening now that the weather is changing. She says that she has been using her rescue inhaler about 3 times a day recently and that she continues to use her Advair. Pt needs a refill on her albuterol nebulizer as well.   Left Leg Spasm:Pt has been having Left leg spasms lately and says that when it starts spasming nothing can make it stop unless it is a pretty strong medication. Last night it started spasming and she took several different meds to make it stop. Pt would like to have something for leg  spasms  Headaches:Pt has a h/o migraines starting a little bit before her husband and her started having so many problems. Her headaches are all around her head. Pt has a hard time desrcibing exactly what the headaches are like but says She continues to have migraines and is out of her Imitrex. She is also out of the Motrin which she is suppose to try first before she uses Imitrex. She would like refills of both.   Depression:Pt is doing well with her medication for Effexor. She is currently taking 150 mg two times a day. She says she feels much better, she is still going to the [facility] for counseling (although she says she has missed her last appointment).  Habits & Providers  Alcohol-Tobacco-Diet     Tobacco Status: never  Current Medications (verified): 1)  Advair Diskus 250-50 Mcg/dose Misc (Fluticasone-Salmeterol) .... Inhale 1 Puff As Directed Twice A Day 2)  Albuterol 90 Mcg/act Aers (Albuterol) .... Inhale 1 Puff Every Four To Six Hours 3)  Ambien 10 Mg Tabs (Zolpidem Tartrate) .... Take 1 Tablet By Mouth At Bedtime 4)  Synthroid 200 Mcg Tabs (Levothyroxine Sodium) .... One By Mouth Daily 5)  Zyrtec Hives Relief 10 Mg Tabs (Cetirizine Hcl) .... Take 1 Tablet By Mouth A Day 6)  Ibuprofen 800 Mg Tabs (Ibuprofen) .Marland Kitchen.. 1 Tablet Every 8 Hours For  Pain 7)  Albuterol Sulfate (2.5 Mg/29ml) 0.083% Nebu (Albuterol Sulfate) .... Inhale 3ml Every 4 Hours As Needed For Dyspnea.  Give Supply For 60 Doses. 8)  Sumatriptan Succinate 100 Mg Tabs (Sumatriptan Succinate) .... One By Mouth As Needed Migraine (After Attempting Ibuprofen 800mg  First) 9)  Venlafaxine Hcl 150 Mg Xr24h-Cap (Venlafaxine Hcl) .... Take One Capsule Every Morning With Food. 10)  Hydroxyzine Hcl 50 Mg Tabs (Hydroxyzine Hcl) .... Take 1 Pill When You Are Itchy, Take 1 Pill Every Evening. 11)  Cephalexin 500 Mg Caps (Cephalexin) .... Take One Pill Every 8 Hours For 7 Days. 12)  Flexeril 10 Mg Tabs (Cyclobenzaprine Hcl)  .... 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  Unemployed.  Has received disability  secondary to myasthenia.  No tobacco, EtOH, recreational drugs.  Home is safe environment.  Currently completing certificate for nursing aide.  Hopes to seek employment as HHRN.  Understands that I will not renew her disability- initially obtained with Dr. Sharene Skeans. Husband of 59yrs has baby by another woman (2009) and she is currently (2010) contemplating divorce.   Review of Systems        vitals reviewed and pertinent negatives and positives seen in HPI   Physical Exam  General:  Well-developed,well-nourished,in no acute distress; alert,appropriate and cooperative throughout examination Head:  Normocephalic and atraumatic without obvious abnormalities. No apparent alopecia or balding (wearing a wig today) Lungs:  Normal respiratory effort, chest expands symmetrically. Lungs are clear to auscultation, no crackles or wheezes. Heart:  Normal rate and regular rhythm. S1 and S2 normal without gallop, murmur, click, rub or other extra sounds. Extremities:  Rt arm in soft brace with no swelling, no erythema, normal cap refill time.  Psych:  pt is mildly slowed in her speech and has a  fairly flat affect. Her voice is fairly monotonic.    Impression & Recommendations:  Problem # 1:  DIABETES MELLITUS II, UNCOMPLICATED (ICD-250.00) Assessment Deteriorated Pt's A1c today is 6.5. Pt has worsened since her last A1c. She has quit her exercise program and gained weight. She is just now getting back into exercising 45 min/day. Will plan to see her back and recheck her A1c at next visit. No medications at this time. If her weight or A1c are not down at the next visit will consider adding metformin.   Orders: A1C-[facility] (04540) [facility]- Est  Level 4 (98119)  Problem # 2:  OBESITY, NOS (ICD-278.00) Assessment: Deteriorated BMI is 34, pt has continued to gain weight. She has just recently started an exercise program. Will plan to weight her again at our next visit to evaluate her progress.  Orders: [facility]- Est  Level 4 (14782)  Problem # 3:  ASTHMA, UNSPECIFIED (ICD-493.90) Assessment: Deteriorated Pt is using albuterol 3 x day now that the weather has changed. Plan to refill Rx for nebulizer. Will plan to get PFT's if pt continues to have problems at her next visit  in 3 months.   Her updated medication list for this problem includes:    Advair Diskus 250-50 Mcg/dose Misc (Fluticasone-salmeterol) ..... Inhale 1 puff as directed twice a day    Albuterol 90 Mcg/act Aers (Albuterol) ..... Inhale 1 puff every four to six hours    Albuterol Sulfate (2.5 Mg/59ml) 0.083% Nebu (Albuterol sulfate) ..... Inhale 3ml every 4 hours as needed for dyspnea.  give supply for 60 doses.  Orders: [facility]- Est  Level 4 (95638)  Problem # 4:  LEG CRAMPS (ICD-729.82) Assessment: Deteriorated Pt is complaining of leg spasms. Plan to treat with a trail of Flexeril. She says she thinks she has tried this medication before and says that it did help. Pt has a h/o myathenia and is disabled because of it. She does see a neurologist.   Orders: [facility]- Est  Level 4 (75643)  Problem # 5:  DEPRESSION, MAJOR, RECURRENT,  MODERATE (ICD-296.32) Assessment: Improved Pt is doing better with the Effexor. Plan to change Rx to Effexor 150mg  Extended release. Pt it to cont counseling.   Orders: [facility]- Est  Level 4 (99214)  Complete Medication List: 1)  Advair Diskus 250-50 Mcg/dose Misc (Fluticasone-salmeterol) .... Inhale 1 puff as directed twice a day 2)  Albuterol 90 Mcg/act Aers (Albuterol) .... Inhale 1 puff every four to six hours 3)  Ambien 10 Mg Tabs (Zolpidem tartrate) .... Take 1 tablet by mouth at bedtime 4)  Synthroid 200 Mcg Tabs (Levothyroxine sodium) .... One by mouth daily 5)  Zyrtec Hives Relief 10 Mg Tabs (Cetirizine hcl) .... Take 1 tablet by mouth a day 6)  Ibuprofen 800 Mg Tabs (Ibuprofen) .Marland Kitchen.. 1 tablet every 8 hours for pain 7)  Albuterol Sulfate (2.5 Mg/42ml) 0.083% Nebu (Albuterol sulfate) .... Inhale 3ml every 4 hours as needed for dyspnea.  give supply for 60 doses. 8)  Sumatriptan Succinate 100 Mg Tabs (Sumatriptan succinate) .... One by mouth as needed migraine (after attempting ibuprofen 800mg  first) 9)  Venlafaxine Hcl 150 Mg Xr24h-cap (Venlafaxine hcl) .... Take one capsule every morning with food. 10)  Hydroxyzine Hcl 50 Mg Tabs (Hydroxyzine hcl) .... Take 1 pill when you are itchy, take 1 pill every evening. 11)  Cephalexin 500 Mg Caps (Cephalexin) .... Take one pill every 8 hours for 7 days. 12)  Flexeril 10 Mg Tabs (Cyclobenzaprine hcl) .... Take one tablet 3 times a day for muscle spasms  Other Orders: TSH-[facility] ([id number])  Patient Instructions: 1)  We refilled lots of prescriptions. Let me know if you have problems with any of them. 2)  Your BMI is 34.32 and we want you 25 or less. Keep exercising and I know this will come down.  3)  I want to see you back in 3 months to recheck you A1c.  Prescriptions: FLEXERIL 10 MG TABS (CYCLOBENZAPRINE HCL) take one tablet 3 times a day for muscle spasms  #90 x 2   Entered and Authorized by:   Jamie Brookes MD   Signed by:   Jamie Brookes MD on 12/25/2008   Method used:   Handwritten   RxID:   [id number] VENLAFAXINE HCL 150 MG XR24H-CAP (VENLAFAXINE HCL) take one capsule every morning with food.  #34 x 6   Entered and Authorized by:   Jamie Brookes MD   Signed by:   Jamie Brookes MD on 12/25/2008   Method used:   Handwritten   RxID:   [id number] IBUPROFEN 800 MG TABS (IBUPROFEN) 1 tablet every 8 hours for pain  #  60 x 6   Entered and Authorized by:   Jamie Brookes MD   Signed by:   Jamie Brookes MD on 12/25/2008   Method used:   Handwritten   RxID:   [id number] SUMATRIPTAN SUCCINATE 100 MG TABS (SUMATRIPTAN SUCCINATE) one by mouth as needed migraine (after attempting ibuprofen 800mg  first)  #20 x 2   Entered and Authorized by:   Jamie Brookes MD   Signed by:   Jamie Brookes MD on 12/25/2008   Method used:   Handwritten   RxID:   [id number] ALBUTEROL SULFATE (2.5 MG/3ML) 0.083% NEBU (ALBUTEROL SULFATE) inhale 3ml every 4 hours as needed for dyspnea.  Give supply for 60 doses.  #60 x 2   Entered and Authorized by:   Jamie Brookes MD   Signed by:   Jamie Brookes MD on 12/25/2008   Method used:   Handwritten   RxID:   [id number] ZYRTEC HIVES RELIEF 10 MG TABS (CETIRIZINE HCL) Take 1 tablet by mouth a day  #31 x 6   Entered and Authorized by:   Jamie Brookes MD   Signed by:   Jamie Brookes MD on 12/25/2008   Method used:   Handwritten   RxID:   [id number]   Laboratory Results   Blood Tests   Date/Time Received: December 21, 2008 8:34 AM  Date/Time Reported: December 21, 2008 8:47 AM   HGBA1C: 6.5%   (Normal Range: Non-Diabetic - 3-6%   Control Diabetic - 6-8%)  Comments: ...............test performed by......Marland KitchenBonnie A. [place], [place] (ASCP)     Prevention & Chronic Care Immunizations   Influenza vaccine: Not documented    Tetanus booster: 08/19/2003: Done.    Pneumococcal vaccine: Not documented  Other Screening   Pap smear: Done.   (12/23/2003)   Smoking status: never  (12/21/2008)  Diabetes Mellitus   HgbA1C: 6.5  (12/21/2008)    Eye exam: Not documented    Foot exam: Not documented   High risk foot: Not documented   Foot care education: Not documented    Urine microalbumin/creatinine ratio: Not documented  Self-Management Support :    Diabetes self-management support: Not documented

## 2010-04-21 NOTE — Assessment & Plan Note (Signed)
Summary: depression,df   Vital Signs:  Patient profile:   35 year old female Height:      66.5 inches Weight:      210 pounds BMI:     33.51 Pulse rate:   81 / minute BP sitting:   107 / 75  (right arm)  Vitals Entered By: Arlyss Repress CMA, (October 16, 2008 1:37 PM)  Nutrition Counseling: Patient's BMI is greater than 25 and therefore counseled on weight management options. CC: Depression, Skin lesions, Diabetes Is Patient Diabetic? No Pain Assessment Patient in pain? no        Primary Care Nettie Wyffels:  Lupita Raider MD  CC:  Depression, Skin lesions, and Diabetes.  History of Present Illness: Depression: Pt has had depression x 1 year. She has tried Celexa and Zoloft and has not had any relief from her depression. Depression stems from the time that her husband got another women pregnant. She is still with her husband and the relationship is going "OK" but she has a hard time trusting him. She has recently begun seeing a Veterinary surgeon at [facility]. She has gone once and has another appointment next week. She says it helps a lot. She would like to try another antidepressant   Skin lesions: Pt has had skin lesions for 3 months and is th eonly one in the family with these lesions. She says they mildy itch. They arise, drain pus, then heal over with hyperpigmentation. She is concerned about these lesions and initially wanted a referral to an internal medicine MD stating "maybe this is something internal". She has been to a dermatologies several times for the lesions. She has used several screams and has taken a course of Doxycycline but it has not helped.  Weight/Diabetes: Pt is going to the gym, she is controlling her diabetes with diet and appears to be doing well.   Habits & Providers  Alcohol-Tobacco-Diet     Tobacco Status: never  Current Medications (verified): 1)  Zoloft 100 Mg Tabs (Sertraline Hcl) .... 1.5 Tablets Daily (Per Mental Health) 2)  Advair Diskus  250-50 Mcg/dose Misc (Fluticasone-Salmeterol) .... Inhale 1 Puff As Directed Twice A Day 3)  Albuterol 90 Mcg/act Aers (Albuterol) .... Inhale 1 Puff Every Four To Six Hours 4)  Ambien 10 Mg Tabs (Zolpidem Tartrate) .... Take 1 Tablet By Mouth At Bedtime 5)  Synthroid 200 Mcg Tabs (Levothyroxine Sodium) .... One By Mouth Daily 6)  Zyrtec Hives Relief 10 Mg Tabs (Cetirizine Hcl) .... Take 1 Tablet By Mouth At Bedtime 7)  Ibuprofen 800 Mg Tabs (Ibuprofen) .Marland Kitchen.. 1 Tablet Every 8 Hours For Pain 8)  Albuterol Sulfate (2.5 Mg/11ml) 0.083% Nebu (Albuterol Sulfate) .... Inhale 3ml Every 4 Hours As Needed For Dyspnea.  Give Supply For 60 Doses. 9)  Sumatriptan Succinate 100 Mg Tabs (Sumatriptan Succinate) .... One By Mouth As Needed Migraine (After Attempting Ibuprofen 800mg  First) 10)  Venlafaxine Hcl 37.5 Mg Tabs (Venlafaxine Hcl) .... Tkae 1 Pill Twice Daily For 4 Days, Then Increase Then One in The Evening Until Taking 3 in The Morning and 3 in The Evening. 11)  Hydroxyzine Hcl 50 Mg Tabs (Hydroxyzine Hcl) .... Take 1 Pill When You Are Itchy, Take 1 Pill Every Evening. 12)  Cephalexin 500 Mg Caps (Cephalexin) .... Take One Pill Every 8 Hours For 7 Days.  Allergies (verified): No Known Drug Allergies  Review of Systems       see HPI  Physical Exam  General:  overweight, in no acute  distress; alert,appropriate and cooperative throughout examination Head:  Normocephalic and atraumatic without obvious abnormalities. No apparent alopecia or balding. Skin:  hyperpigmented areas on Rt arm> Lt arm, upper back and chest and face have lesions in various stages of healing. No pustular lesions seen Psych:  Cognition and judgment appear intact. Alert and cooperative with normal attention span and concentration. No apparent delusions, illusions, hallucinations. Insight into skin lesions appears to be  decreased.    Impression & Recommendations:  Problem # 1:  DEPRESSION, MAJOR, RECURRENT, MODERATE  (ICD-296.32) Assessment Deteriorated  Pt says that her depression hasn't gotten better in 1 year despite being on Zoloft (Celexa before that). She is now going to a counselor at [facility] and 1 hour weekly. She has her second appointment next week. Plan is to change her to: PHQ9 score: 8 symptoms.   Effexor 37.5 mg by mouth two times a day and increase slowly every 4 days to where she is taking 112.5 m two times a day. If pt has no side effects will change to higher dose tabs. (ie 50mg  tabs). Explained to pt that she may not see the full effects for the first 1-1.5 months.    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 She has been seeing a dermatologist and has been on creams and Doxycycline but is not getting better. Pt is worried that something internal may be going on. Pt says that lesions itch more at night. No one else in the home have these lesions.  Suspect purigo nodules or neurotic excoriations.  Plan: Atarax to stop the itching (especially at night)  Keflex 500 mg three times a day x 7 days to treat bacterial causes Triamcinolone cream  (pt already has this at home) Return in 2 weeks if not geting better. Will do skin biopsy at that time. Will check ESR at that time if pt is still having symptoms. Will check CMET (LFT's)  today.   Orders: [facility]- Est  Level 4 (13086)  Problem # 3:  OBESITY, NOS (ICD-278.00) Assessment: Unchanged Pt is obese and is going to a gym. Discussed goal of exercising 60-90 minutes a day but to start with small incremnets. Advised to get a pedometer. Advised on ideal body weight of 159 (a generous number).   Orders: [facility]- Est  Level 4 (57846)  Problem # 4:  DIABETES MELLITUS II, UNCOMPLICATED (ICD-250.00) Assessment: Unchanged Diet controlled. Pt is exercising but needs to do more.   Orders: Comp Met-[facility]  ([id number]) [facility]- Est  Level 4 (24401)  Complete Medication List: 1)  Zoloft 100 Mg Tabs (Sertraline hcl) .... 1.5 tablets daily (per mental health) 2)  Advair Diskus 250-50 Mcg/dose Misc (Fluticasone-salmeterol) .... Inhale 1 puff as directed twice a day 3)  Albuterol 90 Mcg/act Aers (Albuterol) .... Inhale 1 puff every four to six hours 4)  Ambien 10 Mg Tabs (Zolpidem tartrate) .... Take 1 tablet by mouth at bedtime 5)  Synthroid 200 Mcg Tabs (Levothyroxine sodium) .... One by mouth daily 6)  Zyrtec Hives Relief 10 Mg Tabs (Cetirizine hcl) .... Take 1 tablet by mouth at bedtime 7)  Ibuprofen 800 Mg Tabs (Ibuprofen) .Marland Kitchen.. 1 tablet every 8 hours for pain 8)  Albuterol Sulfate (2.5 Mg/2ml) 0.083% Nebu (Albuterol sulfate) .... Inhale 3ml every 4 hours as needed for dyspnea.  give supply for 60 doses. 9)  Sumatriptan Succinate  100 Mg Tabs (Sumatriptan succinate) .... One by mouth as needed migraine (after attempting ibuprofen 800mg  first) 10)  Venlafaxine Hcl 37.5 Mg Tabs (Venlafaxine hcl) .... Tkae 1 pill twice daily for 4 days, then increase then one in the evening until taking 3 in the morning and 3 in the evening. 11)  Hydroxyzine Hcl 50 Mg Tabs (Hydroxyzine hcl) .... Take 1 pill when you are itchy, take 1 pill every evening. 12)  Cephalexin 500 Mg Caps (Cephalexin) .... Take one pill every 8 hours for 7 days.  Patient Instructions: 1)  We are changing your antidepressent to Venlafaxine (Effexor). Call if you notice any side effects.  2)  We are going to try to figure out the cause of the bumps.  3)  Take the antibiotic for 1 week, take the antiitch medicine (may make you sleepy).  4)  [facility]  5)  We are checking your liver and inflammation in blood tests today. Prescriptions: CEPHALEXIN 500 MG CAPS (CEPHALEXIN) take one pill every 8 hours for 7 days.  #21 x 0   Entered and Authorized by:   Jamie Brookes MD   Signed by:   Jamie Brookes MD on 10/16/2008    Method used:   Electronically to        [facility] [address] [phone]* (retail)       [address]       [place], [place]  [address]       Ph: [phone] or [phone]       Fax: [phone]   RxID:   [id number] HYDROXYZINE HCL 50 MG TABS (HYDROXYZINE HCL) take 1 pill when you are itchy, take 1 pill every evening.  #60 x 2   Entered and Authorized by:   Jamie Brookes MD   Signed by:   Jamie Brookes MD on 10/16/2008   Method used:   [facility]        [facility] [address] [phone]* (retail)       [address]       [place], [place]  [address]       Ph: [phone] or [phone]       Fax: [phone]   RxID:   [id number] VENLAFAXINE HCL 37.5 MG TABS (VENLAFAXINE HCL) tkae 1 pill twice daily for 4 days, then increase then one in the evening until taking 3 in the morning and 3 in the evening.  #100 x 1   Entered and Authorized by:   Jamie Brookes MD   Signed by:   Jamie Brookes MD on 10/16/2008   Method used:   [facility]        [facility] [address] [phone]* (retail)       [address]       [place], [place]  [address]       Ph: [phone] or [phone]       Fax: [phone]   RxID:   [id number] SYNTHROID 200 MCG TABS (LEVOTHYROXINE SODIUM) one by mouth daily  #30 x 6   Entered and Authorized by:   Jamie Brookes MD   Signed by:   Jamie Brookes MD on 10/16/2008   Method used:   [facility]ron[facility]        [facility] [address] [phone]* (retail)       [address]  [place], [place]  [address]       Ph: [phone] or [phone]       Fax: [phone]   RxID:   [id number]

## 2010-05-03 ENCOUNTER — Telehealth: Payer: Self-pay | Admitting: Family Medicine

## 2010-05-04 ENCOUNTER — Other Ambulatory Visit: Payer: Self-pay | Admitting: Family Medicine

## 2010-05-04 ENCOUNTER — Ambulatory Visit (HOSPITAL_COMMUNITY)
Admission: RE | Admit: 2010-05-04 | Discharge: 2010-05-04 | Disposition: A | Discharge status: Home / Self Care | Payer: Medicaid Other | Source: Ambulatory Visit | Attending: Family Medicine | Admitting: Family Medicine

## 2010-05-04 DIAGNOSIS — Z111 Encounter for screening for respiratory tuberculosis: Secondary | ICD-10-CM

## 2010-05-04 DIAGNOSIS — R7611 Nonspecific reaction to tuberculin skin test without active tuberculosis: Secondary | ICD-10-CM | POA: Insufficient documentation

## 2010-05-08 ENCOUNTER — Other Ambulatory Visit: Payer: Self-pay | Admitting: Family Medicine

## 2010-05-09 NOTE — Telephone Encounter (Signed)
Please review and refill

## 2010-05-17 ENCOUNTER — Telehealth: Payer: Self-pay | Admitting: Family Medicine

## 2010-05-17 NOTE — Telephone Encounter (Signed)
Ms. Flater is requesting a referral to an Endocrinologist on [facility].   There ph# is [phone]

## 2010-05-19 NOTE — Telephone Encounter (Signed)
The patient has called wanting a referral to a specific endocrinologist but I have not seen her for an endocrine problem for a while and I'm going to need a recent assessment and office visit to be able to do the referral and send recent info to the endocrinologist. Please have her make an appointment.

## 2010-05-20 NOTE — Telephone Encounter (Signed)
Patient called back and I spoke with her concerning her endocrinologist referral. She states that she needs to be seen by one now and that it is due to her diabetes. I explained to her nce again that she will have to make an appointment with Dr. Clotilde Dieter so that up to date visit and info can be given in referral. Patient stated that she has not been getting help and that no one has helped her with her headaches. She then asked to speak to office manager, I explained to her that the office was very busy and that i could take a message and have someone call her back. She then told me that me and Dr. Clotilde Dieter were dead to her and that we are no help.

## 2010-05-20 NOTE — Telephone Encounter (Signed)
LVM for patient to call back. ?

## 2010-05-22 ENCOUNTER — Emergency Department (HOSPITAL_COMMUNITY)
Admission: EM | Admit: 2010-05-22 | Discharge: 2010-05-22 | Disposition: A | Discharge status: Home / Self Care | Payer: Medicaid Other | Attending: Emergency Medicine | Admitting: Emergency Medicine

## 2010-05-22 DIAGNOSIS — R51 Headache: Secondary | ICD-10-CM | POA: Insufficient documentation

## 2010-05-22 DIAGNOSIS — Z79899 Other long term (current) drug therapy: Secondary | ICD-10-CM | POA: Insufficient documentation

## 2010-05-22 DIAGNOSIS — E039 Hypothyroidism, unspecified: Secondary | ICD-10-CM | POA: Insufficient documentation

## 2010-05-22 DIAGNOSIS — E119 Type 2 diabetes mellitus without complications: Secondary | ICD-10-CM | POA: Insufficient documentation

## 2010-05-23 ENCOUNTER — Ambulatory Visit (INDEPENDENT_AMBULATORY_CARE_PROVIDER_SITE_OTHER): Payer: Medicaid Other | Admitting: Family Medicine

## 2010-05-23 VITALS — BP 130/86 | HR 96 | Temp 98.3°F | Wt 211.0 lb

## 2010-05-23 DIAGNOSIS — G43909 Migraine, unspecified, not intractable, without status migrainosus: Secondary | ICD-10-CM

## 2010-05-23 DIAGNOSIS — E890 Postprocedural hypothyroidism: Secondary | ICD-10-CM

## 2010-05-23 MED ORDER — LEVOTHYROXINE SODIUM 175 MCG PO TABS: 175.0000 ug | ORAL_TABLET | Freq: Every day | ORAL | Status: DC

## 2010-05-23 NOTE — Patient Instructions (Signed)
I will fill out the paperwork for you.  You should come back in 2 months to recheck your TSH level. If it is still low then we will need to lower your thyroid medicine again.  Take your thryoid medicine with water 30 min before taking other meds or food.  I can refer you to Dr. Greig Castilla on [facility].

## 2010-05-24 ENCOUNTER — Telehealth: Payer: Self-pay | Admitting: Family Medicine

## 2010-05-24 NOTE — Telephone Encounter (Signed)
Upon reviewing chart, noticed that chest xray had been completed, will sign off on phone note.

## 2010-05-24 NOTE — Telephone Encounter (Signed)
Has anyone responded to this yet?

## 2010-05-24 NOTE — Telephone Encounter (Signed)
Spoke with patient while on speaker phone with Dr. Jennette Kettle and Dollene Primrose present. Explained to patient that her paperwork will not be ready until Thursday. Also informed her of the HA referral she is wanting. Due to it being Medicaid it will be faxed in and reviewed by clinic which may take up to 2 weeks to decide. Then if she is accepted it will take 1 1/2 to 2 months to be seen by the doctor. Patient is wanting a referral to a neurologist. She informed me that she was dismissed from her neurology practice she currently was going to. When i asked her why she stated that "she had told the doctor that he was not taking correct care of her" she states that doctor then dismissed her being that she believes he is not giving proper care. I am going to fill out and fax over the referral for her Headache.

## 2010-05-24 NOTE — Telephone Encounter (Signed)
Pt asking to speak with RN re: paperwork she dropped off yesterday & a new referral

## 2010-05-25 MED ORDER — HYDROCODONE-ACETAMINOPHEN 5-500 MG PO TABS: 1.0000 | ORAL_TABLET | Freq: Three times a day (TID) | ORAL | Status: DC

## 2010-05-25 NOTE — Assessment & Plan Note (Signed)
Pt came in with a list of people she found on chronic headache websites. She does not want to go back to the [facility] and would like to try seeing Dr. Gre[facility]illa at the [facility]. I will refer her to him and forward a copy of this appointment to him if he needs it.

## 2010-05-25 NOTE — Assessment & Plan Note (Signed)
Pt's TSH is low indicating she is on too high a dose of thyroid medication. Plan to decrease her levothyroxine dose to 175 mcg/day. Will plan to retest in 2 months and adjust her dose as needed. Pt decided she would follow up with me not an endocrinologist at this time.

## 2010-05-25 NOTE — Progress Notes (Signed)
  Subjective:    Patient ID: Jade Mathis, female    DOB: 1975-12-28, 35 y.o.   MRN: [id number]  HPI HypoThyroid: Pt was recently seen somewhere in town (she could not tell me where) on 05-20-10 and had her thyroid level checked. Her TSH was found to be 0.06.She is concerned about it and wants to see an Endocrinologist. She has not been in to see me for this issue in quite a while. She also is not taking the medication as recommended  (30 min prior to meal in the morning). She did not know she was suppose to be taking it that way.   Headaches: Pt continues to suffer with headaches. She recently was caught by the narcotics division of police forging my prescriptions of Vicodin and therefore is not getting any Vicodin Rx or any other scheduled meds from here. She does continue, however, toget Vicodin from her neurologist and went to the ED yesterday and was given a Rx for 5  Alprazolam (which she would like refilled today). [facility] and has not been back and says she does not plan to go back. She is suppose to have her next [facility] appointment in about 1 month but she says this is too far away. She would like to look into seeing Dr. Greig [facility] or see one of the other neurologist at [facility] (she is already a patient there).   Paperwork: Pt would like me to fill out paperwork for her school and for [place] access.   I spent more than 30 min with this patient of face to face time.   Review of Systems  [all other systems reviewed and are negative       Objective:   Physical Exam  Constitutional: She appears well-developed and well-nourished.  Psychiatric: Her affect is angry. She is agitated. She expresses inappropriate judgment.       Pt was very irritated and agitated when I entered the room. She does not seem to understand the  nor appropriate behavior within the medical system.           Assessment & Plan:

## 2010-05-26 ENCOUNTER — Telehealth: Payer: Self-pay | Admitting: Family Medicine

## 2010-05-26 NOTE — Telephone Encounter (Signed)
Patient needs a referral to neurologist in [place].  She stopped by and wanted to talk to dr about it asap.  She wants someone to call her because she is in a lot of pain and needs to get an appointment really soon.   She is wanting Dr. Gerilyn Pilgrim.

## 2010-05-29 ENCOUNTER — Other Ambulatory Visit: Payer: Self-pay | Admitting: Family Medicine

## 2010-05-30 ENCOUNTER — Telehealth: Payer: Self-pay | Admitting: *Deleted

## 2010-05-30 NOTE — Telephone Encounter (Signed)
Britta Mccreedy called back saying patient was on the line stating she was returning someone's call. No one had called this patient and when I asked her who's call she was returning she stated that she was not returning a call she said that so she could speak to someone. Noralyn informed me that she is wanting to get her xanax refilled and that she "needed" these fill so that she would have to kill someone. She was wanting me to make an appointment with Dr. Clotilde Dieter, which I explained to her I could not do to me not having the authorization to. I advised her to call back up front and she said no that she does not "need" to she Dr. Clotilde Dieter and that she would like a new doctor so they can prescribe her the xanax and pain meds. I informed her that no doctor her would probably be able to prescribe her the meds due to her incident with the rx forgery. She stated to me "I know I forged them and I had a right to being that Informed Dr. Clotilde Dieter that I needed them and if she gave them to me like I asked her I would not have had to do that". We also once again had the discussion about her being dismissed from the Neurology practice. I also informed her that I sent in the Neurology referral for her to [facility]. Patient cried and said that she needs to go some where she can get these meds prescribed to her because she has kids and needs these. Patient also wanted to know if the prescriptions were sent in and she didn't handle them could she get them.

## 2010-05-31 ENCOUNTER — Ambulatory Visit: Payer: Medicaid Other | Admitting: Family Medicine

## 2010-05-31 ENCOUNTER — Telehealth: Payer: Self-pay | Admitting: *Deleted

## 2010-05-31 ENCOUNTER — Encounter: Payer: Self-pay | Admitting: *Deleted

## 2010-05-31 NOTE — Telephone Encounter (Signed)
Jade Mathis with Dr. Gerilyn Pilgrim (Neurologist) received patient's referral and after reviewing office notes, due to documentation that patient has forged prescriptions, they are declining to take Jade Mathis on as a patient.  Ileana Ladd

## 2010-05-31 NOTE — Telephone Encounter (Signed)
Based on recent behaviors, patient will be dismissed from the practice.  Will send registered letter today.

## 2010-06-01 ENCOUNTER — Telehealth: Payer: Self-pay | Admitting: Family Medicine

## 2010-06-01 NOTE — Telephone Encounter (Signed)
Pt asked to make appt with Luretha Murphy, advised pt she is assigned to Dr. Clotilde Dieter & she would need to make an appt with her, pt immediately asked to speak with the "head person".

## 2010-06-01 NOTE — Telephone Encounter (Signed)
Dr. Deirdre Priest and I will be calling her to day to inform her of her dismissal.

## 2010-06-02 ENCOUNTER — Encounter: Payer: Self-pay | Admitting: Family Medicine

## 2010-06-02 NOTE — Telephone Encounter (Signed)
Mailed letter out

## 2010-06-02 NOTE — Telephone Encounter (Signed)
I have typed up a letter to Jade Mathis about her neurology referral. Please send it to her.  Thanks, Hospital doctor

## 2010-06-03 ENCOUNTER — Encounter: Payer: Self-pay | Admitting: *Deleted

## 2010-06-03 ENCOUNTER — Encounter: Payer: Self-pay | Admitting: Family Medicine

## 2010-06-03 ENCOUNTER — Ambulatory Visit (INDEPENDENT_AMBULATORY_CARE_PROVIDER_SITE_OTHER): Payer: Medicaid Other | Admitting: Family Medicine

## 2010-06-03 VITALS — BP 128/82 | HR 85 | Temp 98.3°F | Ht 67.0 in | Wt 203.0 lb

## 2010-06-03 DIAGNOSIS — R5383 Other fatigue: Secondary | ICD-10-CM

## 2010-06-03 DIAGNOSIS — R5381 Other malaise: Secondary | ICD-10-CM

## 2010-06-03 LAB — CBC
HCT: 30.3 % — ABNORMAL LOW (ref 36.0–46.0)
HCT: 33.5 % — ABNORMAL LOW (ref 36.0–46.0)
HCT: 35.7 % — ABNORMAL LOW (ref 36.0–46.0)
HCT: 37 % (ref 36.0–46.0)
HCT: 42.1 % (ref 36.0–46.0)
Hemoglobin: 10.9 g/dL — ABNORMAL LOW (ref 12.0–15.0)
Hemoglobin: 11.4 g/dL — ABNORMAL LOW (ref 12.0–15.0)
Hemoglobin: 11.7 g/dL — ABNORMAL LOW (ref 12.0–15.0)
Hemoglobin: 9.4 g/dL — ABNORMAL LOW (ref 12.0–15.0)
Hemoglobin: 13.8 g/dL (ref 12.0–15.0)
MCH: 26.2 pg (ref 26.0–34.0)
MCH: 26.4 pg (ref 26.0–34.0)
MCH: 26.5 pg (ref 26.0–34.0)
MCH: 26.5 pg (ref 26.0–34.0)
MCH: 26.8 pg (ref 26.0–34.0)
MCHC: 31 g/dL (ref 30.0–36.0)
MCHC: 31.6 g/dL (ref 30.0–36.0)
MCHC: 31.9 g/dL (ref 30.0–36.0)
MCHC: 32.5 g/dL (ref 30.0–36.0)
MCHC: 32.8 g/dL (ref 30.0–36.0)
MCV: 81.5 fL (ref 78.0–100.0)
MCV: 82.8 fL (ref 78.0–100.0)
MCV: 83 fL (ref 78.0–100.0)
MCV: 85.1 fL (ref 78.0–100.0)
MCV: 81.9 fL (ref 78.0–100.0)
Platelets: 119 10*3/uL — ABNORMAL LOW (ref 150–400)
Platelets: 130 10*3/uL — ABNORMAL LOW (ref 150–400)
Platelets: 192 10*3/uL (ref 150–400)
Platelets: 216 10*3/uL (ref 150–400)
Platelets: 254 10*3/uL (ref 150–400)
RBC: 3.56 MIL/uL — ABNORMAL LOW (ref 3.87–5.11)
RBC: 4.11 MIL/uL (ref 3.87–5.11)
RBC: 4.31 MIL/uL (ref 3.87–5.11)
RBC: 4.46 MIL/uL (ref 3.87–5.11)
RBC: 5.14 MIL/uL — ABNORMAL HIGH (ref 3.87–5.11)
RDW: 13.1 % (ref 11.5–15.5)
RDW: 13.2 % (ref 11.5–15.5)
RDW: 13.5 % (ref 11.5–15.5)
RDW: 13.6 % (ref 11.5–15.5)
RDW: 13.3 % (ref 11.5–15.5)
WBC: 5.3 10*3/uL (ref 4.0–10.5)
WBC: 5.5 10*3/uL (ref 4.0–10.5)
WBC: 6.4 10*3/uL (ref 4.0–10.5)
WBC: 7.1 10*3/uL (ref 4.0–10.5)
WBC: 5.9 10*3/uL (ref 4.0–10.5)

## 2010-06-03 LAB — DIFFERENTIAL
Basophils Absolute: 0 10*3/uL (ref 0.0–0.1)
Basophils Absolute: 0 10*3/uL (ref 0.0–0.1)
Basophils Relative: 0 % (ref 0–1)
Basophils Relative: 0 % (ref 0–1)
Eosinophils Absolute: 0.2 10*3/uL (ref 0.0–0.7)
Eosinophils Absolute: 0.2 10*3/uL (ref 0.0–0.7)
Eosinophils Relative: 4 % (ref 0–5)
Eosinophils Relative: 3 % (ref 0–5)
Lymphocytes Relative: 29 % (ref 12–46)
Lymphocytes Relative: 33 % (ref 12–46)
Lymphs Abs: 1.5 10*3/uL (ref 0.7–4.0)
Lymphs Abs: 2 10*3/uL (ref 0.7–4.0)
Monocytes Absolute: 0.4 10*3/uL (ref 0.1–1.0)
Monocytes Absolute: 0.5 10*3/uL (ref 0.1–1.0)
Monocytes Relative: 8 % (ref 3–12)
Monocytes Relative: 9 % (ref 3–12)
Neutro Abs: 3.2 10*3/uL (ref 1.7–7.7)
Neutro Abs: 3.2 10*3/uL (ref 1.7–7.7)
Neutrophils Relative %: 59 % (ref 43–77)
Neutrophils Relative %: 54 % (ref 43–77)

## 2010-06-03 LAB — BASIC METABOLIC PANEL
BUN: 10 mg/dL (ref 6–23)
BUN: 11 mg/dL (ref 6–23)
BUN: 12 mg/dL (ref 6–23)
BUN: 16 mg/dL (ref 6–23)
BUN: 14 mg/dL (ref 6–23)
CO2: 21 mEq/L (ref 19–32)
CO2: 27 mEq/L (ref 19–32)
CO2: 28 mEq/L (ref 19–32)
CO2: 29 mEq/L (ref 19–32)
CO2: 23 mEq/L (ref 19–32)
Calcium: 5.5 mg/dL — CL (ref 8.4–10.5)
Calcium: 9 mg/dL (ref 8.4–10.5)
Calcium: 9.2 mg/dL (ref 8.4–10.5)
Calcium: 9.4 mg/dL (ref 8.4–10.5)
Calcium: 9.1 mg/dL (ref 8.4–10.5)
Chloride: 105 mEq/L (ref 96–112)
Chloride: 105 mEq/L (ref 96–112)
Chloride: 108 mEq/L (ref 96–112)
Chloride: 91 mEq/L — ABNORMAL LOW (ref 96–112)
Chloride: 103 mEq/L (ref 96–112)
Creat: 0.71 mg/dL (ref 0.40–1.20)
Creatinine, Ser: 0.59 mg/dL (ref 0.4–1.2)
Creatinine, Ser: 0.6 mg/dL (ref 0.4–1.2)
Creatinine, Ser: 0.64 mg/dL (ref 0.4–1.2)
Creatinine, Ser: 0.76 mg/dL (ref 0.4–1.2)
GFR calc Af Amer: >60 mL/min (ref >60)
GFR calc Af Amer: >60 mL/min (ref >60)
GFR calc Af Amer: >60 mL/min (ref >60)
GFR calc Af Amer: >60 mL/min (ref >60)
GFR calc non Af Amer: >60 mL/min (ref >60)
GFR calc non Af Amer: >60 mL/min (ref >60)
GFR calc non Af Amer: >60 mL/min (ref >60)
GFR calc non Af Amer: >60 mL/min (ref >60)
Glucose, Bld: 116 mg/dL — ABNORMAL HIGH (ref 70–99)
Glucose, Bld: 122 mg/dL — ABNORMAL HIGH (ref 70–99)
Glucose, Bld: 133 mg/dL — ABNORMAL HIGH (ref 70–99)
Glucose, Bld: 340 mg/dL — ABNORMAL HIGH (ref 70–99)
Glucose, Bld: 175 mg/dL — ABNORMAL HIGH (ref 70–99)
Potassium: 3.2 mEq/L — ABNORMAL LOW (ref 3.5–5.1)
Potassium: 3.5 mEq/L (ref 3.5–5.1)
Potassium: 3.9 mEq/L (ref 3.5–5.1)
Potassium: 3.9 mEq/L (ref 3.5–5.1)
Potassium: 3.5 mEq/L (ref 3.5–5.3)
Sodium: 137 mEq/L (ref 135–145)
Sodium: 138 mEq/L (ref 135–145)
Sodium: 139 mEq/L (ref 135–145)
Sodium: 154 mEq/L — ABNORMAL HIGH (ref 135–145)
Sodium: 135 mEq/L (ref 135–145)

## 2010-06-03 LAB — COMPREHENSIVE METABOLIC PANEL
ALT: 12 U/L (ref 0–35)
AST: 30 U/L (ref 0–37)
Albumin: 3.7 g/dL (ref 3.5–5.2)
Alkaline Phosphatase: 92 U/L (ref 39–117)
BUN: 10 mg/dL (ref 6–23)
CO2: 25 mEq/L (ref 19–32)
Calcium: 9.2 mg/dL (ref 8.4–10.5)
Chloride: 105 mEq/L (ref 96–112)
Creatinine, Ser: 0.75 mg/dL (ref 0.4–1.2)
GFR calc Af Amer: >60 mL/min (ref >60)
GFR calc non Af Amer: >60 mL/min (ref >60)
Glucose, Bld: 127 mg/dL — ABNORMAL HIGH (ref 70–99)
Potassium: 4.9 mEq/L (ref 3.5–5.1)
Sodium: 135 mEq/L (ref 135–145)
Total Bilirubin: 1.2 mg/dL (ref 0.3–1.2)
Total Protein: 7.6 g/dL (ref 6.0–8.3)

## 2010-06-03 LAB — RETICULOCYTES
RBC.: 4.31 MIL/uL (ref 3.87–5.11)
Retic Count, Absolute: 51.7 10*3/uL (ref 19.0–186.0)
Retic Ct Pct: 1.2 % (ref 0.4–3.1)

## 2010-06-03 LAB — URINALYSIS, ROUTINE W REFLEX MICROSCOPIC
Bilirubin Urine: NEGATIVE
Glucose, UA: NEGATIVE mg/dL
Hgb urine dipstick: NEGATIVE
Ketones, ur: NEGATIVE mg/dL
Nitrite: NEGATIVE
Protein, ur: NEGATIVE mg/dL
Specific Gravity, Urine: 1.007 (ref 1.005–1.030)
Urobilinogen, UA: 0.2 mg/dL (ref 0.0–1.0)
pH: 7.5 (ref 5.0–8.0)

## 2010-06-03 LAB — CONVERTED CEMR LAB
BUN: 14 mg/dL (ref 6–23)
CO2: 23 meq/L (ref 19–32)
Calcium: 9.1 mg/dL (ref 8.4–10.5)
Chloride: 103 meq/L (ref 96–112)
Creatinine, Ser: 0.71 mg/dL (ref 0.40–1.20)
Glucose, Bld: 175 mg/dL — ABNORMAL HIGH (ref 70–99)
Potassium: 3.5 meq/L (ref 3.5–5.3)
Sodium: 135 meq/L (ref 135–145)
TSH: 0.32 microintl units/mL — ABNORMAL LOW (ref 0.350–4.500)

## 2010-06-03 LAB — APTT: aPTT: 31 seconds (ref 24–37)

## 2010-06-03 LAB — HIV ANTIBODY (ROUTINE TESTING W REFLEX): HIV: NONREACTIVE

## 2010-06-03 LAB — HEPATITIS C ANTIBODY: HCV Ab: NEGATIVE

## 2010-06-03 LAB — TYPE AND SCREEN
ABO/RH(D): O POS
Antibody Screen: NEGATIVE

## 2010-06-03 LAB — HEPATIC FUNCTION PANEL
ALT: 11 U/L (ref 0–35)
AST: 14 U/L (ref 0–37)
Albumin: 3.2 g/dL — ABNORMAL LOW (ref 3.5–5.2)
Alkaline Phosphatase: 81 U/L (ref 39–117)
Bilirubin, Direct: <0.1 mg/dL (ref 0.0–0.3)
Total Bilirubin: 0.4 mg/dL (ref 0.3–1.2)
Total Protein: 7 g/dL (ref 6.0–8.3)

## 2010-06-03 LAB — CALCIUM: Calcium: 7.7 mg/dL — ABNORMAL LOW (ref 8.4–10.5)

## 2010-06-03 LAB — GLUCOSE, CAPILLARY: Glucose-Capillary: 119 mg/dL — ABNORMAL HIGH (ref 70–99)

## 2010-06-03 LAB — PROTIME-INR
INR: 1.06 (ref 0.00–1.49)
Prothrombin Time: 14 seconds (ref 11.6–15.2)

## 2010-06-03 LAB — TSH: TSH: 0.32 u[IU]/mL — ABNORMAL LOW (ref 0.350–4.500)

## 2010-06-03 LAB — ABO/RH: ABO/RH(D): O POS

## 2010-06-03 LAB — FIBRINOGEN: Fibrinogen: 405 mg/dL (ref 204–475)

## 2010-06-03 LAB — HEPATITIS B SURFACE ANTIGEN: Hepatitis B Surface Ag: NEGATIVE

## 2010-06-03 NOTE — Assessment & Plan Note (Signed)
Fatigue likely multifactorial.  No evidence of flare of myasthenia gravis.  Will check TSH and BMET.  Discussed with patient many there are many causes for fatigue but I am hopeful that starting treatment at the [facility] will help improve some of the symptoms over time.

## 2010-06-03 NOTE — Progress Notes (Signed)
  Subjective:    Patient ID: Jade Mathis, female    DOB: [birth year], 35 y.o.   MRN: [id number]  HPI Walked into office today, here today for "not feeling right" x "months"  No worse today.   She is unable to describe exactly how she feels except for "sluggish" and "lots of thoughts"  We discuss and she feels anxious and worried and says she has a lot "going on with my boys"  She states she has an appointment at the [facility] on Monday to address this.  She also inquires about "hormone testing" for migraines.  Has headache consistent with migraines, and "dizziness" but not vertigo.   denies fever, chest pain, dyspnea, nausea, vomiting, diarrhea, weakness, diplopia, dysphagia.  Denies SI, HI, Hallucinations.   Review of Systems See hpi    Objective:   Physical Exam  Constitutional: She is oriented to person, place, and time.       Tired appearing, NAD  Eyes: EOM and lids are normal. Pupils are equal, round, and reactive to light.       No ptosis  Neck: Neck supple.  Cardiovascular: Normal rate and regular rhythm.   No murmur heard. Pulmonary/Chest: Effort normal and breath sounds normal. No respiratory distress. She has no wheezes.  Abdominal: Soft.  Musculoskeletal: She exhibits no edema.  Neurological: She is alert and oriented to person, place, and time.       Strength UE 5/5 with intermittent giving way  Psychiatric:       Flat affect          Assessment & Plan:

## 2010-06-03 NOTE — Patient Instructions (Addendum)
Will check your thyroid and metabolic panel today.  I will send you a letter if all results are normal. I am so glad you are seeing [facility] on Monday. I don't think your myasthenia gravis is worsening today.  Continue to follow-up with your neurologist if you have further symptoms.

## 2010-06-03 NOTE — Progress Notes (Signed)
Patient walked into the clinic today at 12:15 asking for a copy of her medical records and a copy of her dismissal letter.  After reading it she stated that she had an "emergency" situation and according to the letter we had to see her today.  When questioned about this "emergency" situation she was very vague and would only say that she felt terrible, something was wrong and she felt like she was going to die.  She was very tearful with a flattened affect.  Explained that we were about to close for lunch but if she came back at 1:30 we would work her in only if there was a no show.  Patient returned to the clinic at 3 pm and will be worked in.  Other than rambling conversation, patient was appropriate throughout this interaction.

## 2010-06-06 ENCOUNTER — Telehealth: Payer: Self-pay | Admitting: Family Medicine

## 2010-06-06 DIAGNOSIS — E890 Postprocedural hypothyroidism: Secondary | ICD-10-CM

## 2010-06-06 MED ORDER — LEVOTHYROXINE SODIUM 150 MCG PO TABS: 150.0000 ug | ORAL_TABLET | Freq: Every day | ORAL | Status: DC

## 2010-06-16 NOTE — Telephone Encounter (Signed)
See phone note

## 2010-06-26 ENCOUNTER — Inpatient Hospital Stay (INDEPENDENT_AMBULATORY_CARE_PROVIDER_SITE_OTHER)
Admission: RE | Admit: 2010-06-26 | Discharge: 2010-06-26 | Disposition: A | Discharge status: Home / Self Care | Payer: Medicaid Other | Source: Ambulatory Visit | Attending: Emergency Medicine | Admitting: Emergency Medicine

## 2010-06-26 DIAGNOSIS — R51 Headache: Secondary | ICD-10-CM

## 2010-06-26 LAB — POCT PREGNANCY, URINE: Preg Test, Ur: NEGATIVE

## 2010-06-27 LAB — POCT URINALYSIS DIP (DEVICE)
Bilirubin Urine: NEGATIVE
Glucose, UA: NEGATIVE mg/dL
Hgb urine dipstick: NEGATIVE
Nitrite: NEGATIVE
Protein, ur: NEGATIVE mg/dL
Specific Gravity, Urine: 1.02 (ref 1.005–1.030)
Urobilinogen, UA: 1 mg/dL (ref 0.0–1.0)
pH: 6.5 (ref 5.0–8.0)

## 2010-06-27 LAB — POCT PREGNANCY, URINE: Preg Test, Ur: NEGATIVE

## 2010-06-29 ENCOUNTER — Other Ambulatory Visit: Payer: Self-pay | Admitting: Family Medicine

## 2010-06-30 ENCOUNTER — Other Ambulatory Visit: Payer: Self-pay | Admitting: Family Medicine

## 2010-06-30 NOTE — Telephone Encounter (Signed)
Refill request

## 2010-07-01 NOTE — Telephone Encounter (Signed)
Refill request

## 2010-07-12 ENCOUNTER — Telehealth: Payer: Self-pay | Admitting: Family Medicine

## 2010-07-12 NOTE — Telephone Encounter (Signed)
Has problem with medicaid card. Advised to call office in AM

## 2010-07-13 ENCOUNTER — Telehealth: Payer: Self-pay | Admitting: Family Medicine

## 2010-07-13 NOTE — Telephone Encounter (Signed)
I

## 2010-07-13 NOTE — Telephone Encounter (Signed)
Patient was dismissed on 05/31/10.  According to the letter, coverage was provided by by [facility] for 30 days (emergency care only).  That time has expired.  The letter specifically instructs the patient to find another primary care provider during that 30 days.  Toenail and foot issues do not constitute an emergency however, I will discuss this with the medical director before a final decision is made.

## 2010-07-13 NOTE — Telephone Encounter (Signed)
Patient calling b/c our name is still on her medicaid card, said it will be removed May 1. Pt needs to go to the [facility], did not know the name of the office but gave me the number [phone]) & needs [place] to call them to give them our NPI number, pt also said she needs to go to [facility] b/c they are the ones that did her toenail surgery, asking [place] to call them to also give them our NPI #. Advised pt I would need to send RN message for approval 1st.

## 2010-07-13 NOTE — Telephone Encounter (Signed)
Agree.  After 30 days have elapsed we are no longer obligated and should not perform primary medical care functions such as referrals. Jade Mathis

## 2010-07-24 ENCOUNTER — Emergency Department (HOSPITAL_COMMUNITY)
Admission: EM | Admit: 2010-07-24 | Discharge: 2010-07-25 | Disposition: A | Discharge status: Home / Self Care | Payer: Medicaid Other | Attending: Emergency Medicine | Admitting: Emergency Medicine

## 2010-07-24 DIAGNOSIS — J45909 Unspecified asthma, uncomplicated: Secondary | ICD-10-CM | POA: Insufficient documentation

## 2010-07-24 DIAGNOSIS — R5381 Other malaise: Secondary | ICD-10-CM | POA: Insufficient documentation

## 2010-07-24 DIAGNOSIS — E0500 Thyrotoxicosis with diffuse goiter without thyrotoxic crisis or storm: Secondary | ICD-10-CM | POA: Insufficient documentation

## 2010-07-24 DIAGNOSIS — E039 Hypothyroidism, unspecified: Secondary | ICD-10-CM | POA: Insufficient documentation

## 2010-07-24 DIAGNOSIS — G7000 Myasthenia gravis without (acute) exacerbation: Secondary | ICD-10-CM | POA: Insufficient documentation

## 2010-07-24 DIAGNOSIS — E119 Type 2 diabetes mellitus without complications: Secondary | ICD-10-CM | POA: Insufficient documentation

## 2010-07-24 DIAGNOSIS — Z79899 Other long term (current) drug therapy: Secondary | ICD-10-CM | POA: Insufficient documentation

## 2010-07-24 DIAGNOSIS — R51 Headache: Secondary | ICD-10-CM | POA: Insufficient documentation

## 2010-07-24 LAB — BASIC METABOLIC PANEL
BUN: 15 mg/dL (ref 6–23)
CO2: 25 mEq/L (ref 19–32)
Calcium: 9.1 mg/dL (ref 8.4–10.5)
Chloride: 106 mEq/L (ref 96–112)
Creatinine, Ser: 0.58 mg/dL (ref 0.4–1.2)
GFR calc Af Amer: >60 mL/min (ref >60)
GFR calc non Af Amer: >60 mL/min (ref >60)
Glucose, Bld: 127 mg/dL — ABNORMAL HIGH (ref 70–99)
Potassium: 3.6 mEq/L (ref 3.5–5.1)
Sodium: 138 mEq/L (ref 135–145)

## 2010-07-24 LAB — VALPROIC ACID LEVEL: Valproic Acid Lvl: 10.3 ug/mL — ABNORMAL LOW (ref 50.0–100.0)

## 2010-07-24 LAB — DIFFERENTIAL
Basophils Absolute: 0 10*3/uL (ref 0.0–0.1)
Basophils Relative: 0 % (ref 0–1)
Eosinophils Absolute: 0.3 10*3/uL (ref 0.0–0.7)
Eosinophils Relative: 4 % (ref 0–5)
Lymphocytes Relative: 39 % (ref 12–46)
Lymphs Abs: 3.1 10*3/uL (ref 0.7–4.0)
Monocytes Absolute: 0.5 10*3/uL (ref 0.1–1.0)
Monocytes Relative: 6 % (ref 3–12)
Neutro Abs: 4.1 10*3/uL (ref 1.7–7.7)
Neutrophils Relative %: 52 % (ref 43–77)

## 2010-07-24 LAB — CBC
HCT: 37.6 % (ref 36.0–46.0)
Hemoglobin: 12 g/dL (ref 12.0–15.0)
MCH: 26.3 pg (ref 26.0–34.0)
MCHC: 31.9 g/dL (ref 30.0–36.0)
MCV: 82.5 fL (ref 78.0–100.0)
Platelets: 268 10*3/uL (ref 150–400)
RBC: 4.56 MIL/uL (ref 3.87–5.11)
RDW: 13.7 % (ref 11.5–15.5)
WBC: 8.1 10*3/uL (ref 4.0–10.5)

## 2010-07-24 LAB — URINALYSIS, ROUTINE W REFLEX MICROSCOPIC
Glucose, UA: NEGATIVE mg/dL
Hgb urine dipstick: NEGATIVE
Ketones, ur: NEGATIVE mg/dL
Nitrite: NEGATIVE
Protein, ur: NEGATIVE mg/dL
Specific Gravity, Urine: 1.025 (ref 1.005–1.030)
Urobilinogen, UA: 1 mg/dL (ref 0.0–1.0)
pH: 6.5 (ref 5.0–8.0)

## 2010-07-24 LAB — POCT PREGNANCY, URINE: Preg Test, Ur: NEGATIVE

## 2010-07-24 LAB — AMMONIA: Ammonia: 25 umol/L (ref 11–60)

## 2010-07-25 LAB — T4, FREE: Free T4: 0.88 ng/dL (ref 0.80–1.80)

## 2010-07-25 LAB — TSH: TSH: 5.667 u[IU]/mL — ABNORMAL HIGH (ref 0.350–4.500)

## 2010-07-30 ENCOUNTER — Emergency Department (HOSPITAL_COMMUNITY)
Admission: EM | Admit: 2010-07-30 | Discharge: 2010-07-30 | Discharge status: Against Medical Advice | Payer: Medicaid Other | Attending: Emergency Medicine | Admitting: Emergency Medicine

## 2010-07-30 DIAGNOSIS — E0500 Thyrotoxicosis with diffuse goiter without thyrotoxic crisis or storm: Secondary | ICD-10-CM | POA: Insufficient documentation

## 2010-08-01 ENCOUNTER — Emergency Department (HOSPITAL_COMMUNITY)
Admission: EM | Admit: 2010-08-01 | Discharge: 2010-08-02 | Disposition: A | Discharge status: Home / Self Care | Payer: Medicaid Other | Attending: Emergency Medicine | Admitting: Emergency Medicine

## 2010-08-01 DIAGNOSIS — G7000 Myasthenia gravis without (acute) exacerbation: Secondary | ICD-10-CM | POA: Insufficient documentation

## 2010-08-01 DIAGNOSIS — R5381 Other malaise: Secondary | ICD-10-CM | POA: Insufficient documentation

## 2010-08-01 DIAGNOSIS — R110 Nausea: Secondary | ICD-10-CM | POA: Insufficient documentation

## 2010-08-01 DIAGNOSIS — H53149 Visual discomfort, unspecified: Secondary | ICD-10-CM | POA: Insufficient documentation

## 2010-08-01 DIAGNOSIS — IMO0001 Reserved for inherently not codable concepts without codable children: Secondary | ICD-10-CM | POA: Insufficient documentation

## 2010-08-01 DIAGNOSIS — G43909 Migraine, unspecified, not intractable, without status migrainosus: Secondary | ICD-10-CM | POA: Insufficient documentation

## 2010-08-01 DIAGNOSIS — E119 Type 2 diabetes mellitus without complications: Secondary | ICD-10-CM | POA: Insufficient documentation

## 2010-08-01 DIAGNOSIS — J45909 Unspecified asthma, uncomplicated: Secondary | ICD-10-CM | POA: Insufficient documentation

## 2010-08-01 DIAGNOSIS — F319 Bipolar disorder, unspecified: Secondary | ICD-10-CM | POA: Insufficient documentation

## 2010-08-01 DIAGNOSIS — E0500 Thyrotoxicosis with diffuse goiter without thyrotoxic crisis or storm: Secondary | ICD-10-CM | POA: Insufficient documentation

## 2010-08-02 LAB — URINALYSIS, ROUTINE W REFLEX MICROSCOPIC
Glucose, UA: NEGATIVE mg/dL
Hgb urine dipstick: NEGATIVE
Ketones, ur: NEGATIVE mg/dL
Nitrite: NEGATIVE
Protein, ur: NEGATIVE mg/dL
Specific Gravity, Urine: 1.012 (ref 1.005–1.030)
Urobilinogen, UA: 1 mg/dL (ref 0.0–1.0)
pH: 7.5 (ref 5.0–8.0)

## 2010-08-02 LAB — CBC
HCT: 35.6 % — ABNORMAL LOW (ref 36.0–46.0)
Hemoglobin: 11.7 g/dL — ABNORMAL LOW (ref 12.0–15.0)
MCH: 26.8 pg (ref 26.0–34.0)
MCHC: 32.9 g/dL (ref 30.0–36.0)
MCV: 81.5 fL (ref 78.0–100.0)
Platelets: 250 10*3/uL (ref 150–400)
RBC: 4.37 MIL/uL (ref 3.87–5.11)
RDW: 13.8 % (ref 11.5–15.5)
WBC: 7.6 10*3/uL (ref 4.0–10.5)

## 2010-08-02 LAB — BASIC METABOLIC PANEL
BUN: 13 mg/dL (ref 6–23)
CO2: 27 mEq/L (ref 19–32)
Calcium: 9.5 mg/dL (ref 8.4–10.5)
Chloride: 105 mEq/L (ref 96–112)
Creatinine, Ser: 0.54 mg/dL (ref 0.4–1.2)
GFR calc Af Amer: >60 mL/min (ref >60)
GFR calc non Af Amer: >60 mL/min (ref >60)
Glucose, Bld: 134 mg/dL — ABNORMAL HIGH (ref 70–99)
Potassium: 3.7 mEq/L (ref 3.5–5.1)
Sodium: 138 mEq/L (ref 135–145)

## 2010-08-02 LAB — DIFFERENTIAL
Basophils Absolute: 0 10*3/uL (ref 0.0–0.1)
Basophils Relative: 0 % (ref 0–1)
Eosinophils Absolute: 0.3 10*3/uL (ref 0.0–0.7)
Eosinophils Relative: 4 % (ref 0–5)
Lymphocytes Relative: 40 % (ref 12–46)
Lymphs Abs: 3 10*3/uL (ref 0.7–4.0)
Monocytes Absolute: 0.6 10*3/uL (ref 0.1–1.0)
Monocytes Relative: 7 % (ref 3–12)
Neutro Abs: 3.7 10*3/uL (ref 1.7–7.7)
Neutrophils Relative %: 48 % (ref 43–77)

## 2010-08-02 LAB — VALPROIC ACID LEVEL: Valproic Acid Lvl: 28.4 ug/mL — ABNORMAL LOW (ref 50.0–100.0)

## 2010-08-02 LAB — PREGNANCY, URINE: Preg Test, Ur: NEGATIVE

## 2010-08-02 LAB — PHENYTOIN LEVEL, TOTAL: Phenytoin Lvl: <2.5 ug/mL — ABNORMAL LOW (ref 10.0–20.0)

## 2010-08-05 NOTE — Op Note (Signed)
   [name], Jade Mathis                          ACCOUNT NO.:  [id number]   MEDICAL RECORD NO.:  [id number]                   PATIENT TYPE:  OIB   LOCATION:  2899                                 FACILITY:  [facility]   PHYSICIAN:  Leonie Man, M.D.                DATE OF BIRTH:  1975/04/07   DATE OF PROCEDURE:  DATE OF DISCHARGE:                                 OPERATIVE REPORT   PREOPERATIVE DIAGNOSIS:  Umbilical hernia.   POSTOPERATIVE DIAGNOSIS:  Umbilical hernia.   OPERATION PERFORMED:  Repair of umbilical hernia with mesh.   SURGEON:  Leonie Man, M.D.   ASSISTANT:  Nurse.   ANESTHESIA:  MAC.  I used 0.5%  Marcaine with epinephrine 1:200,000.   INDICATIONS FOR PROCEDURE:  The patient is a 35 year old recently postpartum  woman with a history of myasthenia gravis who comes now with a moderately  large umbilical hernia which has been intermittently incarcerating and  causing abdominal pain.  She comes to the operating room after the risks and  potential benefits of surgery have been fully discussed, including the risks  of requiring ventilatory treatment with general anesthesia use due to her  myasthenia.   DESCRIPTION OF PROCEDURE:  Following induction of general anesthesia, the  patient was positioned supinely and the abdomen prepped and draped to be  included in a sterile operative field.  The periumbilical region was  infiltrated with 0.5% Marcaine with epinephrine and an infraumbilical and  curvilinear incision was made through the skin an subcutaneous tissue with a  dissection down to the hernia sac, dissecting the hernia sac from off of the  umbilical skin carrying the dissection down to the fascia.  The hernia sac  was opened and dissected free from the fascia and forwarded for free  pathologic evaluation.  Resulting defect was cleared and prepared for  repair.  I used a Bard polypropylene mesh plug to fill the defect and this  was sewn with interrupted sutures of  0 Novofil.  At the end of this, the  suture lines appeared to be intact.  Sponge, instrument and sharp counts  were then verified.  The subcutaneous tissues were then closed with a  running 2-0 Vicryl suture and the skin closed with a running 4-0 Monocryl  suture.  Sterile dressing was applied.  Anesthetic was reversed and the  patient removed from the operating room to the recovery room in stable  condition, having tolerated the procedure well.                                                Leonie Man, M.D.    PB/MEDQ  D:  09/23/2002  T:  09/23/2002  Job:  [id number]

## 2010-08-05 NOTE — Procedures (Signed)
[facility]  Patient:    Jade Mathis, Jade Mathis                         MRN: [id number] Proc. Date: 09/10/99 Adm. Date:  [id number] Disc. Date: [id number] Attending:  Henrene Dodge CC:         Consuello Bossier, M.D.                           Procedure Report  PROCEDURE:  Endoscopic retrograde cholangiography with biliary sphincterotomy and balloon sweeps of the common bile duct.  INDICATIONS:  Question choledocholithiasis, post laparoscopic cholecystectomy.  HISTORY:  This is a 35 year old female who presents to the hospital with symptomatic calculus cholecystitis.  She underwent laparoscopic cholecystectomy last evening.  She was found to have subacute cholecystitis. Intraoperative cholangiogram revealed cutoff of the distal bile duct with no contrast emptying into the duodenum.  She was felt at that time to have common duct stone.  I was asked to evaluate the patient and did so earlier this morning for the purposes of ERCP. The patient was having moderate abdominal discomfort.  It should be noted that preoperative liver tests were abnormal while the lipase was normal.  She is now for ERCP with possible biliary sphincterotomy and stone extraction.  The nature of the procedure as well as the risks, benefits and alternatives were reviewed.  She understood and agreed to proceed.  PHYSICAL EXAMINATION:  GENERAL: Uncomfortable-appearing female in no acute distress.  She is alert and oriented.  LUNGS:  Clear.  HEART:  Regular.  ABDOMEN:  Soft with diffuse tenderness.  DESCRIPTION OF PROCEDURE:  After informed consent was obtained the patient was sedated with 50 mg of Demerol and 5 mg of Versed IV.  Glucagon 0.5 mg IV was given as a duodenal relaxant.  Unasyn 3 grams IV was continued pre-procedurally.  The Olympus side-viewing endoscope was passed blindly into the esophagus.  The stomach was normal.  The duodenum was normal.  The major ampulla was  bulbous and edematous.  X-RAY FINDINGS: 1. Scout radiograph of the abdomen with the endoscope in position revealed    surgical clips in the gallbladder fossa. 2. No attempt at obtaining pancreatogram was made. 3. Selective cannulation of the common bile duct was achieved.  Complete    filling of the biliary tree revealed no abnormalities post-cholecystectomy.    The common bile duct was approximately 2-3 mm in diameter.  No evidence of    stone, stricture or leak.  THERAPY:  Given the patients pain, abnormal liver tests and bulbous appearance of the ampulla, biliary sphincterotomy was made.  Cutting was performed in the 12 oclock orientation over a guidewire.  Sphincterotomy size was deemed moderate.  An 8.5 biliary balloon was then swept through the common bile duct on several occasions with no stones or stone fragments extracted. Excellent drainage of the biliary system was noted post-sphincterotomy.  IMPRESSION: 1. Subacute calculus cholecystitis, status post laparoscopic cholecystectomy. 2. Abnormal intraoperative cholangiogram.  Given the appearance of the major    ampulla, I suspect patient recently passed a small common duct stone.  She    is now status post sphincterotomy with negative residual common duct    filling defects and excellent drainage.  RECOMMENDATIONS: 1. Continue antibiotics. 2. Observation with anticipated discharge in a.m.DD:  09/10/99 TD:  09/12/99 Job: [id number]
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
## 2010-08-05 NOTE — Consult Note (Signed)
[facility]  Patient:    Jade Mathis, Jade Mathis                         MRN: [id number] Proc. Date: 02/25/00 Attending:  Marlan Palau, M.D. CC:         [facility], [address] [place], [place]   Consultation Report  EMERGENCY ROOM CONSULTATION NOTE  HISTORY OF PRESENT ILLNESS:  Jade Mathis is a 35 year old right-handed black female, born 1976/01/17, with a history of myasthenia gravis, Graves disease, and diabetes.  This patient has in the past been medically noncompliant with her medications for myasthenia gravis, has had to be intubated in the past for her myasthenia, underwent a course of plasmapheresis and thymectomy.  The patient has actually done fairly well following the thymectomy, has not had an acute decompensation with the myasthenia gravis since that time.  The patient is on prednisone and azathioprine, but it is not clear how often she has to take the azathioprine.  She says it makes her nauseated every time she put the pill in her mouth.  The patient claims that two to three months ago she began having bifrontal headaches with minimal neck stiffness.  The patient has also had some generalized weakness and fatigue. The patient just got over a cold about a week ago and does not know whether she had fevers and chills with this.  The patient had two apparent blackout episodes two nights ago, and both of these occurred back-to-back.  The patient was out for only a brief period of time, came around very rapidly, did not bite her tongue, did not have bowel or bladder incontinence.  There is some question of whether there may have been some shaking or jerking with the episodes.  The patient comes to the emergency room two days later for an evaluation of this.  The patient denies any significant change in her ability to swallow, her speech, or total level of strength, although she does have some generalized weakness that  is chronic in nature.  Neurology was asked to see this patient for further evaluation.  The patient has refused a CT scan of the head because she does not want to pull the metal pins out of her hair weave.  PAST MEDICAL HISTORY: 1. History of myasthenia gravis. 2. Status post thymectomy for myasthenia gravis. 3. History of diabetes, diet-controlled. 4. History of Graves disease, currently with hypothyroidism, on medication. 5. History of obesity. 6. History of headache, as above.  MEDICATIONS: 1. Azathioprine, supposedly taking 50 mg t.i.d. 2. Prednisone 15 mg daily. 3. Synthroid, I believe, 0.025 mg daily. 4. Mestinon 60 mg 6 tablets daily.  ALLERGIES:  The patient states no known allergies.  TOBACCO/ALCOHOL:  Does not currently smoke or drink.  SOCIAL HISTORY:  The patient lives in the [place] area, is married.  Has one child who is alive and well.  The patient is not employed.  FAMILY HISTORY:  Mother is alive with diabetes, hypertension.  Father also has diabetes and hypertension.  The patient has 10 brothers and sisters.  One sister has lupus.  The other brothers and sisters are in good health.  REVIEW OF SYSTEMS:  Some problems with blurred vision, occasional double vision.  No significant droopiness of the eyelids.  The patient denies any problems with swallowing.  Has chronic hoarseness of the voice since the thymectomy procedure.  Denies any nausea, vomiting.  Shortness of breath has  been present for over a month.  The patient denies any falls with the exception of the blackout episodes.  The patient has some left arm numbness, feels "weak all over."  PHYSICAL EXAMINATION:  VITAL SIGNS:  Blood pressure 120/57, heart rate is 83, respiratory rate 20, temperature afebrile.  GENERAL:  This patient is a moderately obese black female who is alert and cooperative at the time of examination.  HEENT:  Head is atraumatic.  Pupils may be slightly anisocoric, the left  pupil being slightly smaller than the right.  Both pupils are reactive to light. Discs appear to be blurred.  I do not see evidence of venous pulsations. Examination of the patients discs, however, are somewhat difficult due to somewhat poor cooperation.  The patient has relatively full extraocular movements, however, some mild bilateral ptosis that is constant in nature. The patient has no carotid bruits.  LUNGS:  Clear to auscultation and percussion.  CARDIOVASCULAR:  Distant heart sounds.  No obvious murmurs or rubs noted.  EXTREMITIES:  Trace edema at the ankles bilaterally.  NEUROLOGIC:  Cranial nerves as above.  Facial symmetry is present.  The patient has decreased pinprick sensation of the left face compared to the right.  Has fair strength to direct testing of the facial muscles and the muscles of head-turning, shoulder-shrug bilaterally.  Speech is somewhat dysarthric or dysphonic.  The patient has divergence of gaze with an exotropia of the right eye with superior gaze after only a few seconds.  No worsening ptosis is seen with superior gaze deviation for one minute.  The patient has some questionable fatigue or weakness of the deltoid muscles on the left with the arms outstretched for one minute, but there appears to be some problems with good effort on the part of the patient with the motor examination. Otherwise, motor testing appears to be full, symmetric throughout.  Deep tendon reflexes are depressed but symmetric.  Toes neutral bilaterally.  The patient has fair finger-nose-finger, heel-to-shin bilaterally.  The patient is fully ambulatory.  Pinprick sensation reveals decreased pinprick sensation on the left arm and leg compared to the right.  Otherwise, vibratory sensation is intact on all fours.  LABORATORY DATA:  Notable for urinalysis with specific gravity of 1.037, 0-5 white cells noted.  Blood work has been done and reveals sodium of 140, potassium 4.0,  chloride of 108, CO2 of 28, glucose of 120, BUN of 12, creatinine 0.5, calcium of 8.9, total protein 7.1, albumin of 3.4, total  bilirubin 0.4, alkaline phosphatase of 81, AST of 13, magnesium 2.0, ALT of 10.  White count of 4.8, hemoglobin of 10.6, hematocrit 33.2, MCV of 78, platelets of 249.  Chest x-ray shows no acute disease.  EKG shows normal EKG, normal sinus rhythm, heart rate 60.  IMPRESSION: 1. History of myasthenia gravis. 2. History of headache. 3. Diabetes. 4. Thyroid disease.  This patient has some history of headache and some generalized weakness, shortness of breath that has been going on a month or so.  Headache dates back two to three months.  The clinical examination today shows some possible disc edema bilaterally.  Need to refer back to old records concerning this.  Need to possibly evaluate this patient for pseudotumor cerebri or, given the immunosuppression, rule out a chronic fungal meningitis.  The patient apparently had at least two blackout episodes, questionable seizure event two days ago, and the patient will require workup for this.  I have talked to the patient concerning workup, but she  has refused both a CT and MRI scan of the brain because it means removing her metal clips from her hair, which she does not want to do.  I cannot do a lumbar puncture without the scanning procedure first.  I am not clear if there is any fatigable weakness on examination today.  I am not extremely concerned about a myasthenia crisis at this point.  PLAN: 1. Will check a TSH, in addition to the blood work done today. 2. The patient may be willing to do MRI scan sometime later in the week, but    does not want to do it now.  If she will do this, I will set up a lumbar    puncture shortly thereafter.  The patient will contact our office when she    is amenable to a neurologic workup. DD:  02/25/00 TD:  02/26/00 Job: [id number]

## 2010-10-01 ENCOUNTER — Other Ambulatory Visit: Payer: Self-pay | Admitting: Family Medicine

## 2010-12-13 LAB — POCT RAPID STREP A: Streptococcus, Group A Screen (Direct): NEGATIVE

## 2010-12-21 ENCOUNTER — Other Ambulatory Visit: Payer: Self-pay | Admitting: Family Medicine

## 2011-04-14 ENCOUNTER — Emergency Department (HOSPITAL_COMMUNITY)
Admission: EM | Admit: 2011-04-14 | Discharge: 2011-04-15 | Disposition: A | Discharge status: Home / Self Care | Payer: Medicaid Other | Attending: Emergency Medicine | Admitting: Emergency Medicine

## 2011-04-14 ENCOUNTER — Encounter (HOSPITAL_COMMUNITY): Payer: Self-pay | Admitting: *Deleted

## 2011-04-14 DIAGNOSIS — R51 Headache: Secondary | ICD-10-CM | POA: Insufficient documentation

## 2011-04-14 DIAGNOSIS — E119 Type 2 diabetes mellitus without complications: Secondary | ICD-10-CM | POA: Insufficient documentation

## 2011-04-14 DIAGNOSIS — I10 Essential (primary) hypertension: Secondary | ICD-10-CM | POA: Insufficient documentation

## 2011-04-14 DIAGNOSIS — Z79899 Other long term (current) drug therapy: Secondary | ICD-10-CM | POA: Insufficient documentation

## 2011-04-14 DIAGNOSIS — G500 Trigeminal neuralgia: Secondary | ICD-10-CM | POA: Insufficient documentation

## 2011-04-14 DIAGNOSIS — G7000 Myasthenia gravis without (acute) exacerbation: Secondary | ICD-10-CM | POA: Insufficient documentation

## 2011-04-14 HISTORY — DX: Trigeminal neuralgia: G50.0

## 2011-04-14 NOTE — ED Provider Notes (Signed)
History     CSN: [id number]  Arrival date & time 04/14/11  2301   First MD Initiated Contact with Patient 04/14/11 2349      Chief Complaint  Patient presents with  . Facial Pain     HPI  History provided by the patient. Patient is a 36 year old African American female with history of hypertension, diabetes, trigeminal neuralgia and myasthenia gravis who presents with complaints of left facial pain exacerbation related to her trigeminal neuralgia. Patient reports being followed by Dr. Sharene Skeans with neurology for her symptoms. She reports being diagnosed with trigeminal neuralgia over 2 years ago. She reports having many flareups of pain since that time. Currently she is being treated with gabapentin and states this has reduced the number and frequency of her flareups. Patient states she last had extreme pain in left side of her face 3-4 months ago. Pain is typical of her normal trigeminal neuralgia pains. Patient denies any other symptoms with her pain. She denies change in sensation, weakness in face, fever, chills or sweats. Patient does not report any aggravating or alleviating factors.     Past Medical History  Diagnosis Date  . Myasthenia gravis   . Trigeminal neuralgia   . Hypertension   . Diabetes mellitus     History reviewed. No pertinent past surgical history.  History reviewed. No pertinent family history.  History  Substance Use Topics  . Smoking status: Never Smoker   . Smokeless tobacco: Not on file  . Alcohol Use: Not on file    OB History    Grav Para Term Preterm Abortions TAB SAB Ect Mult Living                  Review of Systems  All other systems reviewed and are negative.    Allergies  Review of patient's allergies indicates no known allergies.  Home Medications   Current Outpatient Rx  Name Route Sig Dispense Refill  . ALBUTEROL SULFATE (2.5 MG/3ML) 0.083% IN NEBU Nebulization Take 2.5 mg by nebulization every 4 (four) hours as needed.  Inhale 3 ml every 4 hours for dyspnea. Give supply for 60 doses     . ALBUTEROL 90 MCG/ACT IN AERS Inhalation Inhale 2 puffs into the lungs every 4 (four) hours. Every 4-6 hours     . ALCOHOL PREPS PADS Does not apply by Does not apply route. Use 1 swab every time you test your blood sugar to clean your finger    . ACCU-CHEK ADVANTAGE DIABETES KIT Other 1 each by Other route. Use as instructed     . CETIRIZINE HCL 10 MG PO TABS Oral Take 10 mg by mouth daily.      Marland Kitchen CLOBETASOL PROPIONATE 0.05 % EX CREA Topical Apply topically 2 (two) times daily. Use on hair loss area     . FLUTICASONE-SALMETEROL 250-50 MCG/DOSE IN AEPB Inhalation Inhale 1 puff into the lungs 2 (two) times daily.      Marland Kitchen GLUCOSE BLOOD VI STRP Other 1 each by Other route as needed. Use as instructed     . IBUPROFEN 800 MG PO TABS Oral Take 800 mg by mouth every 8 (eight) hours. For pain     . LANCETS MISC  daily.      Marland Kitchen LEVOTHYROXINE SODIUM 150 MCG PO TABS  TAKE 1 TABLET (150 MCG TOTAL) BY MOUTH DAILY. 30 tablet 0  . METFORMIN HCL 1000 MG PO TABS  TAKE 1 TABLET BY MOUTH EVERY MORNING AND TAKE 1 TABLET  BY MOUTH EVERY EVENING 180 tablet 0  . METOCLOPRAMIDE HCL 10 MG PO TABS Oral Take 10 mg by mouth daily. Take about 30 minutes before meals     . OMEPRAZOLE 20 MG PO CPDR Oral Take 20 mg by mouth daily.      Marland Kitchen POLYETHYLENE GLYCOL 3350 PO POWD Oral Take 17 g by mouth daily. Take one capful      . PROMETHAZINE HCL 25 MG PO TABS Oral Take 25 mg by mouth every evening. For nausea (do not drive while taking this medication)     . SUMATRIPTAN SUCCINATE 100 MG PO TABS Oral Take 100 mg by mouth as needed. As needed for migraine (after attempting ibuprofen 800 mg first)     . TOPIRAMATE 25 MG PO TABS Oral Take 25 mg by mouth 3 (three) times daily.      Marland Kitchen VALACYCLOVIR HCL 500 MG PO TABS Oral Take 500 mg by mouth 2 (two) times daily. For outbreak      . VENLAFAXINE HCL ER 150 MG PO CP24 Oral Take 150 mg by mouth daily with breakfast.      .  XENICAL 120 MG PO CAPS  TAKE ONE TABLET WHEN EATING OILY FOODS. 60 capsule 0    BP 116/76  Pulse 106  Temp(Src) 98.3 F (36.8 C) (Oral)  Resp 18  SpO2 99%  Physical Exam  Nursing note and vitals reviewed. Constitutional: She is oriented to person, place, and time. She appears well-developed and well-nourished. No distress.  HENT:  Head: Normocephalic and atraumatic.  Right Ear: External ear normal.  Left Ear: External ear normal.  Mouth/Throat: Oropharynx is clear and moist.  Eyes: Conjunctivae and EOM are normal. Pupils are equal, round, and reactive to light.  Cardiovascular: Normal rate and regular rhythm.   Pulmonary/Chest: Effort normal and breath sounds normal.  Abdominal: Soft.  Neurological: She is alert and oriented to person, place, and time.       Patient does not smile well bilaterally. Symmetrical strength and biting of mouth. Other movements and facial muscles normal. Denies facial numbness.  Skin: Skin is warm and dry. No rash noted.  Psychiatric: She has a normal mood and affect. Her behavior is normal.    ED Course  Procedures     1. Trigeminal neuralgia       MDM  11:50 PM patient seen and evaluated. Patient in no acute distress.        Angus Seller, [place] 04/15/11 [phone]

## 2011-04-14 NOTE — ED Notes (Signed)
Lt face pain 2 hours ago.  She denies a toothache

## 2011-04-15 ENCOUNTER — Encounter (HOSPITAL_COMMUNITY): Payer: Self-pay | Admitting: Emergency Medicine

## 2011-04-15 MED ORDER — MORPHINE SULFATE 4 MG/ML IJ SOLN
6.0000 mg | Freq: Once | INTRAMUSCULAR | Status: AC
  Administered 2011-04-15: 6 mg via INTRAMUSCULAR
  Filled 2011-04-15: qty 2

## 2011-04-15 NOTE — ED Provider Notes (Signed)
Medical screening examination/treatment/procedure(s) were performed by non-physician practitioner and as supervising physician I was immediately available for consultation/collaboration.   Joya Gaskins, MD 04/15/11 [phone]

## 2011-04-15 NOTE — ED Notes (Signed)
Patient states she has a nerve condition on left side of face.  Patient states her pain started suddenly 2 hours ago.

## 2011-05-18 ENCOUNTER — Encounter: Payer: Self-pay | Admitting: Neurology

## 2011-05-22 ENCOUNTER — Encounter (HOSPITAL_COMMUNITY): Payer: Self-pay | Admitting: Emergency Medicine

## 2011-05-22 ENCOUNTER — Emergency Department (HOSPITAL_COMMUNITY)
Admission: EM | Admit: 2011-05-22 | Discharge: 2011-05-23 | Disposition: A | Discharge status: Home / Self Care | Payer: Medicaid Other | Attending: Emergency Medicine | Admitting: Emergency Medicine

## 2011-05-22 DIAGNOSIS — R51 Headache: Secondary | ICD-10-CM | POA: Insufficient documentation

## 2011-05-22 DIAGNOSIS — E119 Type 2 diabetes mellitus without complications: Secondary | ICD-10-CM | POA: Insufficient documentation

## 2011-05-22 DIAGNOSIS — G500 Trigeminal neuralgia: Secondary | ICD-10-CM

## 2011-05-22 DIAGNOSIS — R29810 Facial weakness: Secondary | ICD-10-CM | POA: Insufficient documentation

## 2011-05-22 DIAGNOSIS — Z79899 Other long term (current) drug therapy: Secondary | ICD-10-CM | POA: Insufficient documentation

## 2011-05-22 DIAGNOSIS — IMO0001 Reserved for inherently not codable concepts without codable children: Secondary | ICD-10-CM | POA: Insufficient documentation

## 2011-05-22 DIAGNOSIS — I10 Essential (primary) hypertension: Secondary | ICD-10-CM | POA: Insufficient documentation

## 2011-05-22 DIAGNOSIS — G7000 Myasthenia gravis without (acute) exacerbation: Secondary | ICD-10-CM | POA: Insufficient documentation

## 2011-05-22 HISTORY — DX: Fibromyalgia: M79.7

## 2011-05-22 NOTE — ED Notes (Signed)
PT. REPORTS RIGHT FACIAL PAIN ONSET TODAY , DENIES INJURY , NO NUMBNESS OR FACIAL ASYMMETRY.

## 2011-05-23 MED ORDER — MORPHINE SULFATE 4 MG/ML IJ SOLN
6.0000 mg | Freq: Once | INTRAMUSCULAR | Status: AC
  Administered 2011-05-23: 6 mg via INTRAMUSCULAR
  Filled 2011-05-23: qty 2

## 2011-05-23 NOTE — Discharge Instructions (Signed)
Please followup with your primary doctor and specialist for continued treatment of your symptoms.  Trigeminal Neuralgia Trigeminal neuralgia is a nerve disorder that causes sudden attacks of severe facial pain. It is caused by damage to the trigeminal nerve, a major nerve in the face. It is more common in women and in the elderly, although it can also happen in younger patients. Attacks last from a few seconds to several minutes and can occur from a couple of times per year to several times per day. Trigeminal neuralgia can be a very distressing and disabling condition. Surgery may be needed in very severe cases if medical treatment does not give relief. HOME CARE INSTRUCTIONS   If your caregiver prescribed medication to help prevent attacks, take as directed.   To help prevent attacks:   Chew on the unaffected side of the mouth.   Avoid touching your face.   Avoid blasts of hot or cold air.   Men may wish to grow a beard to avoid having to shave.  SEEK IMMEDIATE MEDICAL CARE IF:  Pain is unbearable and your medicine does not help.   You develop new, unexplained symptoms (problems).   You have problems that may be related to a medication you are taking.  Document Released: 03/03/2000 Document Revised: 02/23/2011 Document Reviewed: 01/01/2009 [facility] Patient Information 2012 [place], [place].

## 2011-05-23 NOTE — ED Provider Notes (Signed)
History     CSN: [id number]  Arrival date & time 05/22/11  2054   First MD Initiated Contact with Patient 05/23/11 0100      Chief Complaint  Patient presents with  . Facial Pain     HPI  History provided by the patient. Patient is a 36 year old African American female with history of diabetes, hypertension, myasthenia gravis and trigeminal neuralgia who presents with complaints of right facial pain consistent with her trigeminal neuralgia symptoms. Patient states that pain and symptoms became aggravated about 3 hours prior to arrival to emergency room. Patient takes gabapentin for pain is daily and has not missed any doses. Patient states she has occasional flareups similar to this. She denies any additional symptoms associated to the pain. No increased weakness no vision changes no speech difficulties her no weakness or changes in extremity sensations and movements. Patient states that she normally comes emergency room symptoms are this bad and receives a shot of pain medications. Patient states she follows up with [facility] neurology clinic and her last one was in December.     Past Medical History  Diagnosis Date  . Myasthenia gravis   . Trigeminal neuralgia   . Hypertension   . Diabetes mellitus   . Fibromyalgia     History reviewed. No pertinent past surgical history.  No family history on file.  History  Substance Use Topics  . Smoking status: Never Smoker   . Smokeless tobacco: Not on file  . Alcohol Use: Not on file    OB History    Grav Para Term Preterm Abortions TAB SAB Ect Mult Living                  Review of Systems  Constitutional: Negative for fever.  All other systems reviewed and are negative.    Allergies  Review of patient's allergies indicates no known allergies.  Home Medications   Current Outpatient Rx  Name Route Sig Dispense Refill  . ALBUTEROL SULFATE (2.5 MG/3ML) 0.083% IN NEBU Nebulization Take 2.5 mg by nebulization every 4 (four)  hours as needed. Inhale 3 ml every 4 hours for dyspnea. Give supply for 60 doses     . ALBUTEROL 90 MCG/ACT IN AERS Inhalation Inhale 2 puffs into the lungs every 4 (four) hours. Every 4-6 hours     . ARIPIPRAZOLE 10 MG PO TABS Oral Take 10 mg by mouth daily.    Marland Kitchen CETIRIZINE HCL 10 MG PO TABS Oral Take 10 mg by mouth daily.      Marland Kitchen CLOBETASOL PROPIONATE 0.05 % EX CREA Topical Apply topically 2 (two) times daily. Use on hair loss area     . FLUTICASONE-SALMETEROL 250-50 MCG/DOSE IN AEPB Inhalation Inhale 1 puff into the lungs 2 (two) times daily.      . IBUPROFEN 800 MG PO TABS Oral Take 800 mg by mouth every 8 (eight) hours. For pain     . LEVOTHYROXINE SODIUM 175 MCG PO TABS Oral Take 175 mcg by mouth daily.    Marland Kitchen METFORMIN HCL 1000 MG PO TABS Oral Take 1,000 mg by mouth 2 (two) times daily with a meal.    . METOCLOPRAMIDE HCL 10 MG PO TABS Oral Take 10 mg by mouth daily. Take about 30 minutes before meals     . OXYCODONE-ACETAMINOPHEN 7.5-325 MG PO TABS Oral Take 1 tablet by mouth every 3 (three) hours as needed. For pain.    Marland Kitchen SERTRALINE HCL 25 MG PO TABS Oral Take 25  mg by mouth daily.    . TOPIRAMATE 200 MG PO TABS Oral Take 200 mg by mouth daily.    Marland Kitchen VALACYCLOVIR HCL 500 MG PO TABS Oral Take 500 mg by mouth 2 (two) times daily. For outbreak      . ZOLPIDEM TARTRATE 10 MG PO TABS Oral Take 10 mg by mouth at bedtime as needed. For sleep      BP 115/76  Pulse 82  Temp(Src) 98.3 F (36.8 C) (Oral)  Resp 16  SpO2 96%  Physical Exam  Nursing note and vitals reviewed. Constitutional: She is oriented to person, place, and time. She appears well-developed and well-nourished. No distress.  HENT:  Head: Normocephalic and atraumatic.  Mouth/Throat: Oropharynx is clear and moist.  Eyes: Conjunctivae and EOM are normal. Pupils are equal, round, and reactive to light.  Neck: Normal range of motion. Neck supple. No tracheal deviation present.       No masses or swelling  Cardiovascular: Normal  rate and regular rhythm.   Pulmonary/Chest: Effort normal and breath sounds normal. No stridor.  Neurological: She is alert and oriented to person, place, and time.       Patient with slight droop to right face during smile. She closes eyes tightly bilaterally. Patient reports similar sensation bilaterally on face. Normal tongue movements, normal swallowing.  Skin: Skin is warm and dry. No rash noted.  Psychiatric: She has a normal mood and affect. Her behavior is normal.    ED Course  Procedures    1. Trigeminal neuralgia       MDM  1:25 AM patient seen and evaluated. Patient no acute distress.        Angus Seller, PA 05/24/11 Jeralyn Bennett

## 2011-05-29 NOTE — ED Provider Notes (Signed)
Medical screening examination/treatment/procedure(s) were performed by non-physician practitioner and as supervising physician I was immediately available for consultation/collaboration.  Raeford Razor, MD 05/29/11 [phone]

## 2011-06-05 ENCOUNTER — Ambulatory Visit: Payer: Medicaid Other | Attending: Internal Medicine | Admitting: Physical Therapy

## 2011-06-05 DIAGNOSIS — IMO0001 Reserved for inherently not codable concepts without codable children: Secondary | ICD-10-CM | POA: Insufficient documentation

## 2011-06-05 DIAGNOSIS — M6281 Muscle weakness (generalized): Secondary | ICD-10-CM | POA: Insufficient documentation

## 2011-06-05 DIAGNOSIS — R269 Unspecified abnormalities of gait and mobility: Secondary | ICD-10-CM | POA: Insufficient documentation

## 2011-06-05 DIAGNOSIS — R5381 Other malaise: Secondary | ICD-10-CM | POA: Insufficient documentation

## 2011-06-14 ENCOUNTER — Ambulatory Visit: Payer: Medicaid Other | Admitting: Physical Therapy

## 2011-06-20 ENCOUNTER — Ambulatory Visit: Payer: Medicaid Other | Admitting: Physical Therapy

## 2011-06-27 ENCOUNTER — Ambulatory Visit: Payer: Medicaid Other | Admitting: Physical Therapy

## 2011-07-10 ENCOUNTER — Other Ambulatory Visit (INDEPENDENT_AMBULATORY_CARE_PROVIDER_SITE_OTHER): Payer: Medicaid Other

## 2011-07-10 ENCOUNTER — Ambulatory Visit (INDEPENDENT_AMBULATORY_CARE_PROVIDER_SITE_OTHER): Payer: Medicaid Other | Admitting: Neurology

## 2011-07-10 ENCOUNTER — Encounter: Payer: Self-pay | Admitting: Neurology

## 2011-07-10 DIAGNOSIS — G7000 Myasthenia gravis without (acute) exacerbation: Secondary | ICD-10-CM

## 2011-07-10 LAB — COMPREHENSIVE METABOLIC PANEL
ALT: 14 U/L (ref 0–35)
AST: 16 U/L (ref 0–37)
Albumin: 3.9 g/dL (ref 3.5–5.2)
Alkaline Phosphatase: 76 U/L (ref 39–117)
BUN: 12 mg/dL (ref 6–23)
CO2: 29 mEq/L (ref 19–32)
Calcium: 9.5 mg/dL (ref 8.4–10.5)
Chloride: 101 mEq/L (ref 96–112)
Creatinine, Ser: 0.6 mg/dL (ref 0.4–1.2)
GFR: 142.43 mL/min (ref >60.00)
Glucose, Bld: 112 mg/dL — ABNORMAL HIGH (ref 70–99)
Potassium: 4.6 mEq/L (ref 3.5–5.1)
Sodium: 140 mEq/L (ref 135–145)
Total Bilirubin: 0.4 mg/dL (ref 0.3–1.2)
Total Protein: 7.9 g/dL (ref 6.0–8.3)

## 2011-07-10 LAB — CBC WITH DIFFERENTIAL/PLATELET
Basophils Absolute: 0 10*3/uL (ref 0.0–0.1)
Basophils Relative: 0.7 % (ref 0.0–3.0)
Eosinophils Absolute: 0.2 10*3/uL (ref 0.0–0.7)
Eosinophils Relative: 2.9 % (ref 0.0–5.0)
HCT: 41.4 % (ref 36.0–46.0)
Hemoglobin: 13.6 g/dL (ref 12.0–15.0)
Lymphocytes Relative: 22.3 % (ref 12.0–46.0)
Lymphs Abs: 1.4 10*3/uL (ref 0.7–4.0)
MCHC: 32.7 g/dL (ref 30.0–36.0)
MCV: 82.7 fl (ref 78.0–100.0)
Monocytes Absolute: 0.4 10*3/uL (ref 0.1–1.0)
Monocytes Relative: 6.2 % (ref 3.0–12.0)
Neutro Abs: 4.3 10*3/uL (ref 1.4–7.7)
Neutrophils Relative %: 67.9 % (ref 43.0–77.0)
Platelets: 289 10*3/uL (ref 150.0–400.0)
RBC: 5.01 Mil/uL (ref 3.87–5.11)
RDW: 13.6 % (ref 11.5–14.6)
WBC: 6.3 10*3/uL (ref 4.5–10.5)

## 2011-07-10 MED ORDER — GABAPENTIN 300 MG PO CAPS: 600.0000 mg | ORAL_CAPSULE | Freq: Two times a day (BID) | ORAL | Status: DC

## 2011-07-10 NOTE — Progress Notes (Signed)
Dear Mr. Phineas Real,  Thank you for having me see Jade Mathis in consultation today at [facility] for her problem with myasthenia gravis.  As you may recall, she is a 36 y.o. year old female with a history of myasthenia gravis s/p thymectomy who also has a history of narcotics abuse(with documented forgery of narcotics prescriptions), bipolar disorder, chronic headaches and "trigeminal neuralgia".  The patient was seen at [facility] 2 times in the last year. She saw a Dr. Nedra Hai at that facility. She was like to transfer her care to myself because of the distance. It sounds like she was discharged from care at [facility] neurology off of the details of this are unknown.  She was diagnosed with myasthenia gravis in 1997. At that time she was having problems with systemic weakness, double vision, ptosis and difficulty swallowing and speaking.  She was apparently diagnosed by [facility] and blood tests, although I don't have the details of this.  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  She was started on prednisone and mestinon around the same time.  She was also started on azathioprine and it is unknown whether she was on a higher dose than the 50mg  daily of Imuran she is currently on.  She stopped the Imuran in 2004 and when she had a severe exacerbation requiring PLEX in 2011 she was restarted on it.  She has never been off of the prednisone.  They initially tried to restart her on cellcept but she had a rash from this.  Currently she complains of mild problems swallowing at times, very little double vision, a generalize feeling of fatigue but not severe weakness, and straining to keep her eye open at time.  She thinks the Mestinon helps.  She is currently on 10mg  prednisone and 240mg  of Mestinon a day.  She also has a complex headache history, all of which I did not gather today.  She is seem by Dr. Neale Burly for chronic  migraine.  She has a history of narcotic use for headaches as well as getting prescriptions from multiple providers.  She is Topamax 200mg  qhs for her chronic headaches.  Apparently she was supposed to participate in a study of Botox for headaches, but she cannot complete this because of her fibromyalgia.  She also notes that she has been giving thorazine as a headache abortive.  She also has a history of shooting pain in the left lower faced termed trigeminal neuralgia.  This occurs about 2 times per month.  It is shock like, but untriggered.  It can last hours.  The patient typically goes to the ED for this pain.  She is on gabapentin 300 bid, and has never been on higher doses.  When she goes to the ED she usually gets an opiod injection.  She sees Dr. Gwyndolyn Kaufman re her BPD at the [facility].  She also complains of falls and loss of consciousness when she stands up too quickly from bed.  Past Medical History  Diagnosis Date  . Myasthenia gravis   . Trigeminal neuralgia   . Hypertension   . Diabetes mellitus   . Fibromyalgia   . Asthma   . Grave's disease   . Bipolar 1 disorder     Past Surgical History  Procedure Date  . Thymus gland removed 1998  . Abdominal hernia repair 2005  . Cholecystectomy 2003    History  Social History  . Marital Status: Married    Spouse Name: N/A    Number of Children: N/A  . Years of Education: N/A   Social History Main Topics  . Smoking status: Never Smoker   . Smokeless tobacco: Never Used  . Alcohol Use: No  . Drug Use: No  . Sexually Active: None   Other Topics Concern  . None   Social History Narrative  . None    No family history on file.  Current Outpatient Prescriptions on File Prior to Visit  Medication Sig Dispense Refill  . albuterol (PROVENTIL) (2.5 MG/3ML) 0.083% nebulizer solution Take 2.5 mg by nebulization every 4 (four) hours as needed. Inhale 3 ml every 4 hours for dyspnea. Give supply for 60 doses       .  albuterol (PROVENTIL,VENTOLIN) 90 MCG/ACT inhaler Inhale 2 puffs into the lungs every 4 (four) hours. Every 4-6 hours       . ARIPiprazole (ABILIFY) 10 MG tablet Take 10 mg by mouth daily.      . cetirizine (ZYRTEC HIVES RELIEF) 10 MG tablet Take 10 mg by mouth daily.        . clobetasol (TEMOVATE) 0.05 % cream Apply topically 2 (two) times daily. Use on hair loss area       . Fluticasone-Salmeterol (ADVAIR DISKUS) 250-50 MCG/DOSE AEPB Inhale 1 puff into the lungs 2 (two) times daily.        Marland Kitchen gabapentin (NEURONTIN) 300 MG capsule Take 2 capsules (600 mg total) by mouth 2 (two) times daily. Twice a day  120 capsule  5  . ibuprofen (ADVIL,MOTRIN) 800 MG tablet Take 800 mg by mouth every 8 (eight) hours. For pain       . levothyroxine (SYNTHROID, LEVOTHROID) 175 MCG tablet Take 175 mcg by mouth daily.      . metoclopramide (REGLAN) 10 MG tablet Take 10 mg by mouth daily. Take about 30 minutes before meals       . oxyCODONE-acetaminophen (PERCOCET) 7.5-325 MG per tablet Take 1 tablet by mouth every 3 (three) hours as needed. For pain.      Marland Kitchen pyridostigmine (MESTINON) 60 MG tablet Take 60 mg by mouth. Four times a day.      . sertraline (ZOLOFT) 25 MG tablet Take 25 mg by mouth daily.      . sitaGLIPtan-metformin (JANUMET) 50-1000 MG per tablet Take 1 tablet by mouth 2 (two) times daily with a meal.      . topiramate (TOPAMAX) 200 MG tablet Take 200 mg by mouth daily.      . valACYclovir (VALTREX) 500 MG tablet Take 500 mg by mouth 2 (two) times daily. For outbreak        . zolpidem (AMBIEN) 10 MG tablet Take 10 mg by mouth at bedtime as needed. For sleep      - azathioprine 50mg  daily - prednisone 10mg  daily   No Known Allergies    ROS:  13 systems were reviewed and are notable for difficulty with sleep, nausea, diffuse pain.  All other review of systems are unremarkable.   Examination:  Filed Vitals:   07/10/11 1049  BP: 100/66  Pulse: 80  Weight: 201 lb (91.173 kg)     In general,  dysphoric appearing women.  Cardiovascular: The patient has a regular rate and rhythm and no carotid bruits.  Fundoscopy:  Disks are flat. Vessel caliber within normal limits.  Mental status:   The patient is oriented to person, place and time.  Recent and remote memory are intact. Attention span and concentration are normal. Language including repetition, naming, following commands are intact. Fund of knowledge of current and historical events, as well as vocabulary are normal.  Cranial Nerves: Pupils are equally round and reactive to light. Visual fields full to confrontation. Reveal full versions.  However, I can induce subjective diplopia on lateral gaze. Facial sensation and muscles of mastication are intact. Muscles of facial expression are symmetric and strong.  There is some mild fatiguable ptosis worse on the left. Hearing intact to bilateral finger rub. Tongue protrusion, uvula, palate midline.  Shoulder shrug intact.  Neck extension/flexion normal.  Motor:  The patient has normal bulk and tone, no pronator drift.  There are no adventitious movements.  5/5 muscle strength bilaterally.  Reflexes:  1+ throughout Toes down  Coordination:  Normal finger to nose.  No dysdiadokinesia.  Sensation is intact to temperature and vibration.  Gait and Station are normal.   Romberg is negative.  She does have difficulty standing on her heals or toes.   Impression/Recs: 1.  Myasthenia gravis - I am going to get the records from [facility] as well as from [facility].  I would like to increase her azathioprine to the point we have an MCV >100.  Currently her MCV is in the 80s.  However, I would like to get more extensive documentation of her diagnosis before I make any changes.  For now I will leave her on prednisone 10mg  and azathioprine 50mg  daily.  When I see her back I will likely increase the azathioprine with the goal of getting her off the prednisone. 2.  ?Trigeminal neuralgia - This does not sounds  like TN.  It is untriggered.  I have increased her gabapentin to 600 bid, and need to see what workup has been done in the past for this by Dr. Anne Hahn who originally diagnosed it. 3.  Migraine headaches - will allow Dr. Neale Burly to continue to deal with these.  I will not give her opiates given her history of opiod abuse and forging prescriptions. 4.  Falls/LOC when standing up too quickly - advised to arise from bed more slowly.     We will see the patient back in 3.  Thank you for having [place] see Jade Mathis in consultation.  Feel free to contact me with any questions.  Lupita Raider Modesto Charon, MD [facility], [address] [place], [place] [address] Phone: [phone] Fax: [phone].

## 2011-07-10 NOTE — Patient Instructions (Signed)
Go to the basement to have your labs drawn today.  . 

## 2011-08-26 ENCOUNTER — Emergency Department (HOSPITAL_COMMUNITY)
Admission: EM | Admit: 2011-08-26 | Discharge: 2011-08-26 | Disposition: A | Discharge status: Home / Self Care | Payer: Medicaid Other | Attending: Emergency Medicine | Admitting: Emergency Medicine

## 2011-08-26 ENCOUNTER — Encounter (HOSPITAL_COMMUNITY): Payer: Self-pay | Admitting: *Deleted

## 2011-08-26 DIAGNOSIS — E119 Type 2 diabetes mellitus without complications: Secondary | ICD-10-CM | POA: Insufficient documentation

## 2011-08-26 DIAGNOSIS — J45909 Unspecified asthma, uncomplicated: Secondary | ICD-10-CM | POA: Insufficient documentation

## 2011-08-26 DIAGNOSIS — G7000 Myasthenia gravis without (acute) exacerbation: Secondary | ICD-10-CM | POA: Insufficient documentation

## 2011-08-26 DIAGNOSIS — F319 Bipolar disorder, unspecified: Secondary | ICD-10-CM | POA: Insufficient documentation

## 2011-08-26 DIAGNOSIS — E0500 Thyrotoxicosis with diffuse goiter without thyrotoxic crisis or storm: Secondary | ICD-10-CM | POA: Insufficient documentation

## 2011-08-26 DIAGNOSIS — I10 Essential (primary) hypertension: Secondary | ICD-10-CM | POA: Insufficient documentation

## 2011-08-26 DIAGNOSIS — G500 Trigeminal neuralgia: Secondary | ICD-10-CM | POA: Insufficient documentation

## 2011-08-26 MED ORDER — OXYCODONE-ACETAMINOPHEN 5-325 MG PO TABS
2.0000 | ORAL_TABLET | Freq: Once | ORAL | Status: AC
  Administered 2011-08-26: 2 via ORAL
  Filled 2011-08-26: qty 2

## 2011-08-26 NOTE — ED Provider Notes (Signed)
Medical screening examination/treatment/procedure(s) were performed by non-physician practitioner and as supervising physician I was immediately available for consultation/collaboration.  Mairead Schwarzkopf, MD 08/26/11 0556 

## 2011-08-26 NOTE — ED Provider Notes (Signed)
History     CSN: [id number]  Arrival date & time 08/26/11  0865   First MD Initiated Contact with Patient 08/26/11 0310      Chief Complaint  Patient presents with  . face pain     HPI  History provided by the patient. Patient is a 36 year old female with history of hypertension, diabetes, Graves' disease, myasthenia gravis, trigeminal neuralgia who presents with complaints of left face pain. Patient states pain has made it difficult for her to sleep tonight and feels the same as her trigeminal neuralgia pains. Patient seen by Dr. for neurology and reports being put on gabapentin for her symptoms. This normally helps her for some reason she states tonight her pain is much worse. Pain comes and cheek area and face. She denies any other symptoms. Denies any swelling of the face. She denies any dental pain or TMJ pains. Patient has not tried anything else for symptoms. She denies any other associated symptoms. She denies any fever, chills, sweats, nausea vomiting.    Past Medical History  Diagnosis Date  . Myasthenia gravis   . Trigeminal neuralgia   . Hypertension   . Diabetes mellitus   . Fibromyalgia   . Asthma   . Grave's disease   . Bipolar 1 disorder     Past Surgical History  Procedure Date  . Thymus gland removed 1998  . Abdominal hernia repair 2005  . Cholecystectomy 2003    No family history on file.  History  Substance Use Topics  . Smoking status: Never Smoker   . Smokeless tobacco: Never Used  . Alcohol Use: No    OB History    Grav Para Term Preterm Abortions TAB SAB Ect Mult Living                  Review of Systems  Constitutional: Negative for fever and chills.  HENT: Negative for facial swelling, trouble swallowing and neck pain.   Respiratory: Negative for cough.   Gastrointestinal: Negative for nausea and vomiting.  Skin: Negative for rash.    Allergies  Review of patient's allergies indicates no known allergies.  Home Medications    Current Outpatient Rx  Name Route Sig Dispense Refill  . ALBUTEROL SULFATE (2.5 MG/3ML) 0.083% IN NEBU Nebulization Take 2.5 mg by nebulization every 4 (four) hours as needed. Inhale 3 ml every 4 hours for dyspnea. Give supply for 60 doses     . ALBUTEROL 90 MCG/ACT IN AERS Inhalation Inhale 2 puffs into the lungs every 4 (four) hours. Every 4-6 hours     . ARIPIPRAZOLE 10 MG PO TABS Oral Take 10 mg by mouth daily.    Marland Kitchen CETIRIZINE HCL 10 MG PO TABS Oral Take 10 mg by mouth daily.      Marland Kitchen CLOBETASOL PROPIONATE 0.05 % EX CREA Topical Apply topically 2 (two) times daily. Use on hair loss area     . FLUTICASONE-SALMETEROL 250-50 MCG/DOSE IN AEPB Inhalation Inhale 1 puff into the lungs 2 (two) times daily.      Marland Kitchen GABAPENTIN 300 MG PO CAPS Oral Take 2 capsules (600 mg total) by mouth 2 (two) times daily. Twice a day 120 capsule 5  . IBUPROFEN 800 MG PO TABS Oral Take 800 mg by mouth every 8 (eight) hours. For pain     . LEVOTHYROXINE SODIUM 175 MCG PO TABS Oral Take 175 mcg by mouth daily.    Marland Kitchen METOCLOPRAMIDE HCL 10 MG PO TABS Oral Take 10 mg by  mouth daily. Take about 30 minutes before meals     . OXYCODONE-ACETAMINOPHEN 7.5-325 MG PO TABS Oral Take 1 tablet by mouth every 3 (three) hours as needed. For pain.    Marland Kitchen PYRIDOSTIGMINE BROMIDE 60 MG PO TABS Oral Take 60 mg by mouth. Four times a day.    . SERTRALINE HCL 25 MG PO TABS Oral Take 25 mg by mouth daily.    Marland Kitchen SITAGLIPTIN-METFORMIN HCL 50-1000 MG PO TABS Oral Take 1 tablet by mouth 2 (two) times daily with a meal.    . TOPIRAMATE 200 MG PO TABS Oral Take 200 mg by mouth daily.    Marland Kitchen VALACYCLOVIR HCL 500 MG PO TABS Oral Take 500 mg by mouth 2 (two) times daily. For outbreak      . ZOLPIDEM TARTRATE 10 MG PO TABS Oral Take 10 mg by mouth at bedtime as needed. For sleep      BP 106/61  Pulse 97  Temp(Src) 98.6 F (37 C) (Oral)  Resp 20  SpO2 94%  Physical Exam  Nursing note and vitals reviewed. Constitutional: She is oriented to person,  place, and time. She appears well-developed and well-nourished. No distress.  HENT:  Head: Normocephalic.  Right Ear: Tympanic membrane normal.  Left Ear: Tympanic membrane normal.  Mouth/Throat: Oropharynx is clear and moist.       Normal dentition without pain to percussion. No swelling of the mouth or gums. Oropharynx clear. No swelling of salivary glands. Mouth is moist.  Neck: Normal range of motion. Neck supple.       Surgical scars consistent with history of prior surgeries  Cardiovascular: Normal rate and regular rhythm.   Pulmonary/Chest: Effort normal and breath sounds normal. She has no wheezes.  Neurological: She is alert and oriented to person, place, and time. No cranial nerve deficit or sensory deficit.  Skin: Skin is warm and dry. No rash noted.  Psychiatric: She has a normal mood and affect. Her behavior is normal.    ED Course  Procedures      1. Trigeminal neuralgia pain       MDM  Patient seen and evaluated. Patient no acute distress.  Patient without any concerning findings on exam. History is similar to prior trigeminal neuralgia. Will give Percocet for pain here. Patient arrived with her sister who drove. She will followup with her neurologist for continued evaluation and treatment for any continued pains.      Angus Seller, [place] 08/26/11 [phone]

## 2011-08-26 NOTE — ED Notes (Signed)
Lt face swelling and pain for one hour.

## 2011-08-26 NOTE — Discharge Instructions (Signed)
Please continue to take your medications for your neuralgia pains. Please followup with your primary care provider and neurology specialist for continued evaluation and treatment. Return to the emergency room for any worsening or changing symptoms.   Trigeminal Neuralgia Trigeminal neuralgia is a nerve disorder that causes sudden attacks of severe facial pain. It is caused by damage to the trigeminal nerve, a major nerve in the face. It is more common in women and in the elderly, although it can also happen in younger patients. Attacks last from a few seconds to several minutes and can occur from a couple of times per year to several times per day. Trigeminal neuralgia can be a very distressing and disabling condition. Surgery may be needed in very severe cases if medical treatment does not give relief. HOME CARE INSTRUCTIONS   If your caregiver prescribed medication to help prevent attacks, take as directed.   To help prevent attacks:   Chew on the unaffected side of the mouth.   Avoid touching your face.   Avoid blasts of hot or cold air.   Men may wish to grow a beard to avoid having to shave.  SEEK IMMEDIATE MEDICAL CARE IF:  Pain is unbearable and your medicine does not help.   You develop new, unexplained symptoms (problems).   You have problems that may be related to a medication you are taking.  Document Released: 03/03/2000 Document Revised: 02/23/2011 Document Reviewed: 01/01/2009 [facility] Patient Information 2012 [place], [place].    RESOURCE GUIDE  Chronic Pain Problems: Contact [facility]  [phone] Patients need to be referred by their primary care doctor.  Insufficient Money for Medicine: Contact United Way:  call "211" or [facility] [phone].  No Primary Care Doctor: - Call Health Connect  [phone] - can help you locate a primary care doctor that  accepts your insurance, provides certain services, etc. - Physician Referral  [phone]  Agencies that provide inexpensive medical care: - [facility]  [phone] - [facility]  [phone] - [facility]  [phone] [facility]  [phone] - [facility]  [phone] [facility]  [phone]  Medicaid-accepting [facility] Providers: - [facility]- [address]  [phone], Mon-Fri 9am-7pm, Sat 9am-1pm - [facility]- [address] [place], [address] [place]  [phone] - [facility]- [address] [place]  [phone] [facility]- [address]  [phone] - Renaye Rakers- [address] [place], [address], [phone]  Only accepts [place] patients after they have their name  applied to their card  Self Pay (no insurance) in [place]: - Sickle Cell Patients: Dr Willey Blade, [facility]  [address] [place], [phone] - [facility]- [address] [place]  [phone]       [facility]- [address]       -     [facility]- see information above (Speak to [employer] if you do not have insurance)       -  [facility]- [address] [place], [phone]       -  [facility]- [address],  [phone]       -  [facility]- [address], [phone]       -  Dr Julio Sicks-  [address], [place], [phone]       -  [facility]- [address] [phone]       -  [facility]- [address] [phone], also [address], [phone]       -    [facility]- [address] [place], [phone], 1st & 3rd Saturday   every month, 10am-1pm  1) Find a Doctor and Pay Out of Pocket Although you won't have to find out who is covered by your insurance plan, it is a good idea to ask around and get recommendations. You will then need to call the  office and see if the doctor you have chosen will accept you as a new patient and what types of options they offer for patients who are self-pay. Some doctors offer discounts or will set up payment plans for their patients who do not have insurance, but you will need to ask so you aren't surprised when you get to your appointment.  2) Contact Your Local Health Department Not all health departments have doctors that can see patients for sick visits, but many do, so it is worth a call to see if yours does. If you don't know where your local health department is, you can check in your phone book. The CDC also has a tool to help you locate your state's health department, and many state websites also have listings of all of their local health departments.  3) Find a Walk-in Clinic If your illness is not likely to be very severe or complicated, you may want to try a walk in clinic. These are popping up all over the country in pharmacies, drugstores, and shopping centers. They're usually staffed by nurse practitioners or physician assistants that have been trained to treat common illnesses and complaints. They're usually fairly quick and inexpensive. However, if you have serious medical issues or chronic medical problems, these are probably not your best option  STD Testing - [facility] Department of [facility], STD Clinic, [address] [place], phone [phone] or [phone].  Monday - Friday, call for an appointment. [facility] Department of [employer], STD Clinic, [address], [place], phone [phone] or [phone].  Monday - Friday, call for an appointment.  Abuse/Neglect: [facility] Child Abuse Hotline 719-239-1708 [facility] Child Abuse Hotline (647)127-1189 (After Hours)  Emergency Shelter:  [facility] (401)367-1463  Maternity Homes: - [facility] at [facility] [place] [facility] 8126395781 - [facility]a [facility]  Services [phone]  MRSA Hotline #:   (234)869-4395  [facility] Resources  [facility] [place] [facility]  United Way [facility] Dept. [address]                 [address]         [address] [place] [address]  [place]                                               Cristobal Goldmann Phone:  [phone]  Phone:  [phone]                   Phone:  [phone]  [facility], [phone] - [facility] - [phone]       -     [facility] in [place], [address],                                  [phone], [facility] Child Abuse Hotline (215) 203-3968 or 781-788-3601 (After Hours)   Behavioral Health Services  Substance Abuse Resources: - [facility] Drug Services  (519)651-8027 - [facility] [phone] - [facility] [phone] Floydene Flock [phone] - Residential & Outpatient Substance Abuse Program  ([phone]  Psychological Services: [facility]  [phone] [facility]  [phone] - [facility], [phone] [place]. [address], [place], ACCESS LINE: 510-210-2857 or [phone], [email or web address]  Dental Assistance  If unable to pay or uninsured, contact:  [facility] or [facility]. to become qualified for the adult dental clinic.  Patients with Medicaid: [facility] [phone] [address], [phone] [address] [phone]  If unable to pay, or uninsured, contact [facility] (929)381-3499) or [facility] Department [phone] in [place], [phone] in [facility]) to become qualified for the adult dental clinic  Other Low-Cost Community Dental Services: - [facility]- [address] [place], [place], [place], [address], [phone], Ext. [phone],  2nd and 4th Thursday of the month at 6:30am.  10 clients each day by appointment, can sometimes see walk-in patients if someone does not show for an appointment. [facility]- [address] [place] Griffins [place], [place], [address], [phone] - [facility]- [address], [place], [place], [address], [phone] - [facility]- [phone] [facility]- [phone] [facility] Department- [phone]

## 2011-09-18 ENCOUNTER — Encounter: Payer: Self-pay | Admitting: Obstetrics and Gynecology

## 2011-09-18 ENCOUNTER — Ambulatory Visit (INDEPENDENT_AMBULATORY_CARE_PROVIDER_SITE_OTHER): Payer: Medicaid Other | Admitting: Obstetrics and Gynecology

## 2011-09-18 VITALS — BP 110/70 | Resp 14 | Wt 206.0 lb

## 2011-09-18 DIAGNOSIS — Z30432 Encounter for removal of intrauterine contraceptive device: Secondary | ICD-10-CM

## 2011-09-18 DIAGNOSIS — Z3169 Encounter for other general counseling and advice on procreation: Secondary | ICD-10-CM

## 2011-09-18 DIAGNOSIS — O09529 Supervision of elderly multigravida, unspecified trimester: Secondary | ICD-10-CM

## 2011-09-18 MED ORDER — CONCEPT OB 130-92.4-1 MG PO CAPS: 1.0000 | ORAL_CAPSULE | ORAL | Status: DC

## 2011-09-18 NOTE — Progress Notes (Signed)
Pt reqs IUD removal today. Pt trying to conceive.

## 2011-09-18 NOTE — Progress Notes (Signed)
Here for IUD removal--multiple medical issues. Myasthenia gravis, in remission AMA Patient Active Problem List  Diagnosis  . HYPOTHYROIDISM, POSTSURGICAL  . DIABETES MELLITUS II, UNCOMPLICATED  . OBESITY, NOS  . DEPRESSION, MAJOR, RECURRENT, MODERATE  . INSOMNIA, CHRONIC  . MIGRAINE HEADACHE  . MYASTHENIA GRAVIS  . RHINITIS, ALLERGIC  . ASTHMA, UNSPECIFIED  . GERD  . NEUROTIC EXCORIATIONS  . HAIR LOSS  . BACK PAIN  . Fatigue   Wants to conceive--medical issues and medications reviewed. Primary MD at [facility] aware of desire for pregnancy. Will Rx PNV  IUD removed without difficulty.  To follow-up with +UPT--reviewed with patient status as hight-risk pregnancy Encouraged her to work to achieve very stable blood sugar status.

## 2011-09-19 ENCOUNTER — Telehealth: Payer: Self-pay | Admitting: Obstetrics and Gynecology

## 2011-09-19 NOTE — Telephone Encounter (Signed)
TRIAGE/CHT RECEIVED

## 2011-09-19 NOTE — Telephone Encounter (Signed)
TC to pt.   States is trying to conceive.  Taking Oxycodone tid  And Meloxicam (Mobic) qd.  Questioning if can be harmful to fetus. Will consult with VL.

## 2011-09-19 NOTE — Telephone Encounter (Signed)
I just took the IUD out on 09/18/11--has she had a positive pregnancy test already, or just thinking ahead?  Oxycodone can be addictive, so if she can get by without it, that would be best. Meloxicam (Mobic) is not recommended in 3rd trimester, or in patients having difficulty getting pregnant. Category C

## 2011-09-20 ENCOUNTER — Telehealth: Payer: Self-pay | Admitting: Obstetrics and Gynecology

## 2011-09-20 NOTE — Telephone Encounter (Signed)
TC to pt.  States has not had +UPT. Is planning ahead for pregnancy.  Per VL, informed Oxycodone may be addictive and if can get by without it, it  ould be preferable.  Also, Melaxicam can interfere with conception if pt is having difficulty getting pregnant.   Pt verbalizes comprehension.

## 2011-09-29 ENCOUNTER — Ambulatory Visit (INDEPENDENT_AMBULATORY_CARE_PROVIDER_SITE_OTHER): Payer: Self-pay | Admitting: General Surgery

## 2011-10-09 ENCOUNTER — Encounter: Payer: Self-pay | Admitting: Obstetrics and Gynecology

## 2011-10-09 ENCOUNTER — Ambulatory Visit: Payer: Medicaid Other | Admitting: Neurology

## 2011-10-12 ENCOUNTER — Ambulatory Visit (INDEPENDENT_AMBULATORY_CARE_PROVIDER_SITE_OTHER): Payer: Medicaid Other | Admitting: Obstetrics and Gynecology

## 2011-10-12 ENCOUNTER — Encounter: Payer: Self-pay | Admitting: Obstetrics and Gynecology

## 2011-10-12 VITALS — BP 120/80 | Ht 67.0 in | Wt 208.0 lb

## 2011-10-12 DIAGNOSIS — Z79899 Other long term (current) drug therapy: Secondary | ICD-10-CM

## 2011-10-12 NOTE — Progress Notes (Signed)
Pt here to discuss meds pt trying to concieve BP 120/80  Ht 5\' 7"  (1.702 m)  Wt 208 lb (94.348 kg)  BMI 32.58 kg/m2  LMP 10/08/2011 All medication reviewed.  Pt told although Albuterol, Advir and Mestinon are CAT C.  I would recommend her to continue these meds because if she stopped them it would drastically impede her health.  Pt told to follow up with her psychiatrist about abilify, zoloft, and neurontin.  She feels stable enough to wean off of the meds.   Pt told to stop Topamax.  She takes them for headaches and it is a category D She was told risks of AMA in pregnancy.  Increase risks of SAB, genetic d/os, HTN, DM PT had IUD removed by VL Call with postive UPT Pt states her thyroid and blood sugars have been under control

## 2011-11-02 ENCOUNTER — Ambulatory Visit (INDEPENDENT_AMBULATORY_CARE_PROVIDER_SITE_OTHER): Payer: Medicaid Other | Admitting: Neurology

## 2011-11-02 ENCOUNTER — Encounter: Payer: Self-pay | Admitting: Neurology

## 2011-11-02 VITALS — BP 110/72 | HR 88 | Wt 208.0 lb

## 2011-11-02 DIAGNOSIS — G7000 Myasthenia gravis without (acute) exacerbation: Secondary | ICD-10-CM

## 2011-11-02 MED ORDER — GABAPENTIN 300 MG PO CAPS: 600.0000 mg | ORAL_CAPSULE | Freq: Three times a day (TID) | ORAL | Status: DC

## 2011-11-02 MED ORDER — AZATHIOPRINE 100 MG PO TABS: 100.0000 mg | ORAL_TABLET | Freq: Every day | ORAL | Status: DC

## 2011-11-02 NOTE — Progress Notes (Signed)
Dear Dr. Concepcion Elk,  Thank you for having me see Jade Mathis in follow uptoday at [facility] for her problem with myasthenia gravis. As you may recall, she is a 36 y.o. year old female with a history of myasthenia gravis s/p thymectomy who also has a history of narcotics abuse(with documented forgery of narcotics prescriptions), bipolar disorder, chronic headaches and "trigeminal neuralgia".   The patient was seen at [facility] 2 times in the last year. She saw a Dr. Nedra Hai at that facility. She was like to transfer her care to myself because of the distance. It sounds like she was discharged from care at [facility] neurology off of the details of this are unknown.   She was diagnosed with myasthenia gravis in 1997. At that time she was having problems with systemic weakness, double vision, ptosis and difficulty swallowing and speaking. She was apparently diagnosed by [facility] and blood tests, although I don't have the details of this. 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 She was started on prednisone and mestinon around the same time. She was also started on azathioprine and it is unknown whether she was on a higher dose than the 50mg  daily of Imuran she is currently on. She stopped the Imuran in 2004 and when she had a severe exacerbation requiring PLEX in 2011 she was restarted on it. She has never been off of the prednisone. They initially tried to restart her on cellcept but she had a rash from this.   When I first saw her last time about 3 months ago she had been stable.  At that time she was taking 10mg  of prednisone a day with azathioprine 50mg  per day and Mestinon 60mg  qid.  She was complaining of mild swallowing problems at times, with little double vision, but generalized fatigue and weakness, as well as possible intermittent ptosis.  This appear to have been stable since I saw her.  I did get records of her  visits at [facility] and apparently she had been on Imuran 50 bid at one time.  She had been discharged from their clinic due to what they though was malingering.  She continues to have frequent headaches, and this is managed by Dr. Neale Burly at the [facility] center.  She says that she continues on Topamax for this.  She also get shooting pains in the face -- termed trigeminal neuralgia.  This has been non progressive.  These are left sided and happen about 1-2 times per month and can last all day.  She has been going to the ED for the pain where she gets an opiod injection.  At her last visit I increased her gabapentin to 600 bid, but it sounds like it has not changed the frequency.    Medical history, social history, and family history were reviewed and have not changed since the last clinic visit.  Current Outpatient Prescriptions on File Prior to Visit  Medication Sig Dispense Refill  . albuterol (PROVENTIL) (2.5 MG/3ML) 0.083% nebulizer solution Take 2.5 mg by nebulization every 4 (four) hours as needed. Inhale 3 ml every 4 hours for dyspnea. Give supply for 60 doses       . albuterol (PROVENTIL,VENTOLIN) 90 MCG/ACT inhaler Inhale 2 puffs into the lungs every 4 (four) hours. Every 4-6 hours       . ARIPiprazole (ABILIFY) 10 MG tablet Take 10 mg by mouth daily.      Marland Kitchen  cetirizine (ZYRTEC HIVES RELIEF) 10 MG tablet Take 10 mg by mouth daily.        . clobetasol (TEMOVATE) 0.05 % cream Apply topically 2 (two) times daily. Use on hair loss area       . Fluticasone-Salmeterol (ADVAIR DISKUS) 250-50 MCG/DOSE AEPB Inhale 1 puff into the lungs 2 (two) times daily.        Marland Kitchen ibuprofen (ADVIL,MOTRIN) 800 MG tablet Take 800 mg by mouth every 8 (eight) hours. For pain       . levothyroxine (SYNTHROID, LEVOTHROID) 175 MCG tablet Take 175 mcg by mouth daily.      . Meloxicam-Liniment (MELOXICAM COMFORT PAC) 15 MG KIT by Combination route.      Marland Kitchen oxyCODONE-acetaminophen (PERCOCET) 7.5-325 MG per tablet  Take 1 tablet by mouth every 3 (three) hours as needed. For pain.      Marland Kitchen Potassium Chloride (KLOR-CON PO) Take by mouth daily.      Burnis Medin w/o A Vit-FeFum-FePo-FA (CONCEPT OB) 130-92.4-1 MG CAPS Take 1 tablet by mouth 1 day or 1 dose.  30 capsule  12  . pyridostigmine (MESTINON) 60 MG tablet Take 60 mg by mouth. Four times a day.      . sertraline (ZOLOFT) 25 MG tablet Take 25 mg by mouth daily.      . sitaGLIPtan-metformin (JANUMET) 50-1000 MG per tablet Take 1 tablet by mouth 2 (two) times daily with a meal.      . topiramate (TOPAMAX) 200 MG tablet Take 200 mg by mouth daily.      . valACYclovir (VALTREX) 500 MG tablet Take 500 mg by mouth 2 (two) times daily. For outbreak        . zolpidem (AMBIEN) 10 MG tablet Take 10 mg by mouth at bedtime as needed. For sleep      . DISCONTD: gabapentin (NEURONTIN) 300 MG capsule Take 2 capsules (600 mg total) by mouth 2 (two) times daily. Twice a day  120 capsule  5  . metoclopramide (REGLAN) 10 MG tablet Take 10 mg by mouth daily. Take about 30 minutes before meals         Allergies  Allergen Reactions  . Depo-Provera (Medroxyprogesterone)   . Vicodin (Hydrocodone-Acetaminophen) Nausea Only    ROS:  13 systems were reviewed and are notable for depression from bipolar disorder.  All other review of systems are unremarkable.  Exam: . Filed Vitals:   11/02/11 1437  BP: 110/72  Pulse: 88  Weight: 208 lb (94.348 kg)    In general, flat affect.    Cranial Nerves: Pupils are equally round and reactive to light.  She has very minimal fatiguable weakness on sustained upgaze. Facial sensation and muscles of mastication are intact. Very mild eye closure weakness. Hearing intact to bilateral finger rub. Tongue deviation normal.  Shoulder shrug intact  Motor:  Normal bulk and tone, no drift and 5-/5 muscle strength, although some of this may be giveaway.  Reflexes:  Quiet.  Coordination:  Normal finger to nose  Gait:  Normal gait and  station.  Romberg negative.  Impression/Recommendations:  1.  Myasthenia gravis - Initially I recommended increasing her azathioprine to 100mg  daily with the goal of eventually getting her MCV > 100 and then weaning the steroids.  However, when addressing the issue of pregnancy, which admittedly was done quickly she said she was not planning on getting pregnant but is not on birth control.  However, after her visit, I checked her notes and it appears that  she is trying to get pregnant.  While this is certainly possible with myasthenia gravis I would recommend a withdrawal of the azathioprine and an increase in her prednisone to at least 30mg  daily.  I am going to call her back for another appointment as this needs to be discussed in full.  In some studies there is about a 5% risk of maternal mortality with MG and of course her baby will suffer with neonatal MG for the first 3 weeks.  She needs to know these risks. 2.  Headaches - I will leave this up to Dr. Neale Burly.  However, clearly the Topamax should be weaned during pregnancy if possible. 3.  Atypical facial pain - stable.  I was originally going to increase her gabapentin to 600 tid if needed during the exacerbations of her facial pain.  However, while gabapentin probably does not have significant fetal toxicity, ideally it would be weaned during pregnancy. 4.  Bipolar disorder - I am VERY concerned about her mental health and ability to wean her sertraline and abilify.  She should make sure she talks to the [facility] before this.  I am going to see the patient back next week to discuss.  Lupita Raider Modesto Charon, MD [facility], [place]

## 2011-11-09 ENCOUNTER — Ambulatory Visit: Payer: Medicaid Other | Admitting: Neurology

## 2011-11-23 ENCOUNTER — Other Ambulatory Visit: Payer: Medicaid Other

## 2011-11-23 ENCOUNTER — Encounter: Payer: Self-pay | Admitting: Neurology

## 2011-11-23 ENCOUNTER — Ambulatory Visit (INDEPENDENT_AMBULATORY_CARE_PROVIDER_SITE_OTHER): Payer: Medicaid Other | Admitting: Neurology

## 2011-11-23 VITALS — BP 126/80 | Wt 217.0 lb

## 2011-11-23 DIAGNOSIS — Z79899 Other long term (current) drug therapy: Secondary | ICD-10-CM

## 2011-11-23 LAB — COMPREHENSIVE METABOLIC PANEL
ALT: 50 U/L — ABNORMAL HIGH (ref 0–35)
AST: 51 U/L — ABNORMAL HIGH (ref 0–37)
Albumin: 3.3 g/dL — ABNORMAL LOW (ref 3.5–5.2)
Alkaline Phosphatase: 61 U/L (ref 39–117)
BUN: 14 mg/dL (ref 6–23)
CO2: 28 mEq/L (ref 19–32)
Calcium: 8.6 mg/dL (ref 8.4–10.5)
Chloride: 101 mEq/L (ref 96–112)
Creatinine, Ser: 0.7 mg/dL (ref 0.4–1.2)
GFR: 115.53 mL/min (ref >60.00)
Glucose, Bld: 209 mg/dL — ABNORMAL HIGH (ref 70–99)
Potassium: 3.7 mEq/L (ref 3.5–5.1)
Sodium: 135 mEq/L (ref 135–145)
Total Bilirubin: 0.3 mg/dL (ref 0.3–1.2)
Total Protein: 6.8 g/dL (ref 6.0–8.3)

## 2011-11-23 LAB — CBC WITH DIFFERENTIAL/PLATELET
Basophils Absolute: 0 10*3/uL (ref 0.0–0.1)
Basophils Relative: 0.3 % (ref 0.0–3.0)
Eosinophils Absolute: 0.2 10*3/uL (ref 0.0–0.7)
Eosinophils Relative: 3.8 % (ref 0.0–5.0)
HCT: 37.5 % (ref 36.0–46.0)
Hemoglobin: 11.9 g/dL — ABNORMAL LOW (ref 12.0–15.0)
Lymphocytes Relative: 31.6 % (ref 12.0–46.0)
Lymphs Abs: 1.7 10*3/uL (ref 0.7–4.0)
MCHC: 31.6 g/dL (ref 30.0–36.0)
MCV: 83.5 fl (ref 78.0–100.0)
Monocytes Absolute: 0.3 10*3/uL (ref 0.1–1.0)
Monocytes Relative: 6 % (ref 3.0–12.0)
Neutro Abs: 3 10*3/uL (ref 1.4–7.7)
Neutrophils Relative %: 58.3 % (ref 43.0–77.0)
Platelets: 241 10*3/uL (ref 150.0–400.0)
RBC: 4.49 Mil/uL (ref 3.87–5.11)
RDW: 14 % (ref 11.5–14.6)
WBC: 5.2 10*3/uL (ref 4.5–10.5)

## 2011-11-23 NOTE — Progress Notes (Signed)
  I saw Jade Mathis back to talk to her about her presumed myasthenia and intentions to get pregnant.  As I mentioned in my last note, I had recently increased her Imuran to 100mg  daily from 50mg  daily.  She had cancelled a follow up appointment, but I managed to catch her today as she was having labs drawn for the Imuran increase.  She states today that she is taking the increased dose of Imuran and prednisone 10mg  daily as well as mestinon 60mg  daily.  She says that she is not planning now on getting pregnant.  Unfortunately she cannot tell me why she changed her mind.  She is using condom as birth control.  She is not using an OCP.    I outlined the risks of myasthenia during pregnancy - 40% chance of exacerbation, neonatal myasthenia, and risk of azathioprine to the fetus(which is likely small).  I also express the need for her to talk to her psychiatrist about this before changing her psychotropic medications.  I emphasized that the patient should contact us if she decided that she wants to get pregnant.  She also should have her PCP involved.  I encouraged her to start an OCP or other method than a barrier method.  We will check her CBC and CMP today given the increase in azathioprine.  I have asked her to follow up here with Dr. Arbutus Leas.  However, Dr. Arbutus Leas may want to re-refer her to [facility] where she was followed before by the neuromuscular group.  Lupita Raider Modesto Charon, MD [facility], [place]

## 2011-12-04 NOTE — Progress Notes (Signed)
Pt notified.  She does not want to go back to [facility].  She also does not want to go to [facility].  She will check back with her pcp to get referred to a neurology group in [place].

## 2011-12-10 ENCOUNTER — Telehealth: Payer: Self-pay | Admitting: Obstetrics and Gynecology

## 2011-12-10 ENCOUNTER — Inpatient Hospital Stay (HOSPITAL_COMMUNITY)
Admission: AD | Admit: 2011-12-10 | Discharge: 2011-12-10 | Disposition: A | Discharge status: Home / Self Care | Payer: Medicaid Other | Source: Ambulatory Visit | Attending: Obstetrics and Gynecology | Admitting: Obstetrics and Gynecology

## 2011-12-10 ENCOUNTER — Encounter (HOSPITAL_COMMUNITY): Payer: Self-pay | Admitting: *Deleted

## 2011-12-10 ENCOUNTER — Inpatient Hospital Stay (HOSPITAL_COMMUNITY): Payer: Medicaid Other

## 2011-12-10 DIAGNOSIS — E669 Obesity, unspecified: Secondary | ICD-10-CM | POA: Insufficient documentation

## 2011-12-10 DIAGNOSIS — E119 Type 2 diabetes mellitus without complications: Secondary | ICD-10-CM | POA: Insufficient documentation

## 2011-12-10 DIAGNOSIS — N946 Dysmenorrhea, unspecified: Secondary | ICD-10-CM

## 2011-12-10 DIAGNOSIS — G7000 Myasthenia gravis without (acute) exacerbation: Secondary | ICD-10-CM | POA: Insufficient documentation

## 2011-12-10 DIAGNOSIS — R109 Unspecified abdominal pain: Secondary | ICD-10-CM | POA: Insufficient documentation

## 2011-12-10 LAB — CBC WITH DIFFERENTIAL/PLATELET
Basophils Absolute: 0 10*3/uL (ref 0.0–0.1)
Basophils Relative: 0 % (ref 0–1)
Eosinophils Absolute: 0.3 10*3/uL (ref 0.0–0.7)
Eosinophils Relative: 4 % (ref 0–5)
HCT: 38 % (ref 36.0–46.0)
Hemoglobin: 12.3 g/dL (ref 12.0–15.0)
Lymphocytes Relative: 38 % (ref 12–46)
Lymphs Abs: 2.8 10*3/uL (ref 0.7–4.0)
MCH: 26.6 pg (ref 26.0–34.0)
MCHC: 32.4 g/dL (ref 30.0–36.0)
MCV: 82.1 fL (ref 78.0–100.0)
Monocytes Absolute: 0.6 10*3/uL (ref 0.1–1.0)
Monocytes Relative: 8 % (ref 3–12)
Neutro Abs: 3.8 10*3/uL (ref 1.7–7.7)
Neutrophils Relative %: 51 % (ref 43–77)
Platelets: 254 10*3/uL (ref 150–400)
RBC: 4.63 MIL/uL (ref 3.87–5.11)
RDW: 13.6 % (ref 11.5–15.5)
WBC: 7.5 10*3/uL (ref 4.0–10.5)

## 2011-12-10 LAB — URINALYSIS, ROUTINE W REFLEX MICROSCOPIC
Bilirubin Urine: NEGATIVE
Glucose, UA: 100 mg/dL — AB
Hgb urine dipstick: NEGATIVE
Ketones, ur: NEGATIVE mg/dL
Leukocytes, UA: NEGATIVE
Nitrite: NEGATIVE
Protein, ur: NEGATIVE mg/dL
Specific Gravity, Urine: >1.03 — ABNORMAL HIGH (ref 1.005–1.030)
Urobilinogen, UA: 1 mg/dL (ref 0.0–1.0)
pH: 5.5 (ref 5.0–8.0)

## 2011-12-10 LAB — WET PREP, GENITAL
Clue Cells Wet Prep HPF POC: NONE SEEN
Trich, Wet Prep: NONE SEEN
Yeast Wet Prep HPF POC: NONE SEEN

## 2011-12-10 LAB — HCG, SERUM, QUALITATIVE: Preg, Serum: NEGATIVE

## 2011-12-10 LAB — POCT PREGNANCY, URINE: Preg Test, Ur: NEGATIVE

## 2011-12-10 LAB — HCG, QUANTITATIVE, PREGNANCY: hCG, Beta Chain, Quant, S: <1 m[IU]/mL (ref <5)

## 2011-12-10 MED ORDER — KETOROLAC TROMETHAMINE 60 MG/2ML IM SOLN
60.0000 mg | Freq: Once | INTRAMUSCULAR | Status: AC
  Administered 2011-12-10: 60 mg via INTRAMUSCULAR
  Filled 2011-12-10: qty 2

## 2011-12-10 NOTE — MAU Note (Signed)
Pt reports having abd cramping that started today. Denies any vaginal discharge. Pt LMP 11/08/11
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
## 2011-12-10 NOTE — Telephone Encounter (Signed)
BM yesterday does not feel need for bm with urinary frequency not pain, declines visit now to hospital and would like to be seen at office left message at office. Lavera Guise, CNM

## 2011-12-10 NOTE — Telephone Encounter (Signed)
States also neg pg test today, discussed repeat test later in week may still get menses, motrin, tylenol, tums for discomfort report if symptoms change or unresolved. Lavera Guise, CNM

## 2011-12-11 ENCOUNTER — Telehealth: Payer: Self-pay | Admitting: Obstetrics and Gynecology

## 2011-12-11 ENCOUNTER — Encounter: Payer: Self-pay | Admitting: Obstetrics and Gynecology

## 2011-12-11 ENCOUNTER — Ambulatory Visit (INDEPENDENT_AMBULATORY_CARE_PROVIDER_SITE_OTHER): Payer: Medicaid Other | Admitting: Obstetrics and Gynecology

## 2011-12-11 VITALS — BP 118/74 | HR 80 | Temp 98.4°F | Wt 216.0 lb

## 2011-12-11 DIAGNOSIS — R82998 Other abnormal findings in urine: Secondary | ICD-10-CM

## 2011-12-11 DIAGNOSIS — R102 Pelvic and perineal pain: Secondary | ICD-10-CM

## 2011-12-11 DIAGNOSIS — R81 Glycosuria: Secondary | ICD-10-CM

## 2011-12-11 DIAGNOSIS — E1165 Type 2 diabetes mellitus with hyperglycemia: Secondary | ICD-10-CM

## 2011-12-11 DIAGNOSIS — N912 Amenorrhea, unspecified: Secondary | ICD-10-CM

## 2011-12-11 DIAGNOSIS — IMO0001 Reserved for inherently not codable concepts without codable children: Secondary | ICD-10-CM

## 2011-12-11 DIAGNOSIS — R8281 Pyuria: Secondary | ICD-10-CM

## 2011-12-11 DIAGNOSIS — N949 Unspecified condition associated with female genital organs and menstrual cycle: Secondary | ICD-10-CM

## 2011-12-11 LAB — POCT URINALYSIS DIPSTICK
Bilirubin, UA: NEGATIVE
Blood, UA: NEGATIVE
Glucose, UA: >5
Ketones, UA: NEGATIVE
Nitrite, UA: NEGATIVE
Protein, UA: NEGATIVE
Spec Grav, UA: >=1.03
Urobilinogen, UA: NEGATIVE
pH, UA: 5

## 2011-12-11 LAB — POCT URINE PREGNANCY: Preg Test, Ur: NEGATIVE

## 2011-12-11 MED ORDER — OXYCODONE-ACETAMINOPHEN 5-325 MG PO TABS: 1.0000 | ORAL_TABLET | ORAL | Status: DC | PRN

## 2011-12-11 NOTE — Progress Notes (Signed)
36 YO with a history of DM, HTN and Graves disease complains of late menses and pelvic cramping x 2 days rated 10/10 but hasn't taken anything for it since she's out of her Percocet she takes for back pain (herniated disc). Pain is constant and has not aggravating factors but minimal relief with lying down.  Patient had IUD removed in July and has not been using anything for contraception. Denies urinary tract symptoms, nausea, vomiting diarrhea or fever.  Admits to positional dyspareunia. States that thyroid test done last month was normal.   O: BP 118/74  Pulse 80  Temp 98.4 F (36.9 C) (Oral)  Wt 216 lb (97.977 kg)  LMP 11/08/2011      Back: no CVA tenderness but tender over the left SI joint.      Abdomen: soft, diffusely tender in both lower quadrants without guarding or rebound       Pelvic: EGBUS- wnl, vagina-normal, cervix-Nabothian cysts otherwise no lesions, uterus tender as is left adnexae with no palpable masses, no right adnexal      tenderness or masses  UPT-negative U/A- pH-5.0,  SG-1.030, glucose->5, leukocytes-trace  A: Left Pelvic Pain     Uncontrolled Diabetes Mellitus     Late menses     Pyuria  P:  Urine culture             Pelvic U/S to rule out ovarian cyst       Reviewed causes of pelvic pain: urogenital, previous surgery, gastrointestinal and musculoskeletal.        Percocet 5/325 # 20 1 po q 4 hours prn-pain (states she's out of her Percocet           Follow up with Dr. Normand Sloop after U/S and with Dr. Lucianne Muss for uncontrolled diabetes       RTO-as scheduled or prn  Doss Cybulski, PA-C

## 2011-12-11 NOTE — Telephone Encounter (Signed)
VM from [facility]. Pt is 1 day late for menses. Trying to conceive.  Having cramping. To have eval. TC to pt. TC to pt. LM to return call.

## 2011-12-11 NOTE — Telephone Encounter (Signed)
TC from pt. States still having cramping. No menses. States drinking "lots of water."  Scheduled for eval with EP today.

## 2011-12-12 LAB — GC/CHLAMYDIA PROBE AMP, GENITAL
Chlamydia, DNA Probe: NEGATIVE
GC Probe Amp, Genital: NEGATIVE

## 2011-12-13 LAB — URINE CULTURE
Colony Count: NO GROWTH
Organism ID, Bacteria: NO GROWTH

## 2011-12-14 ENCOUNTER — Telehealth: Payer: Self-pay | Admitting: Obstetrics and Gynecology

## 2011-12-14 NOTE — Telephone Encounter (Signed)
Spoke with pt rgd msg pt had a question rgd iud insertion pt wants to wait until after u/s to have it inserted advised pt once we assess where her pelvic pain is coming from then we will schd iud insertion pt voice understadning

## 2011-12-21 ENCOUNTER — Other Ambulatory Visit: Payer: Medicaid Other

## 2011-12-21 ENCOUNTER — Encounter: Payer: Medicaid Other | Admitting: Obstetrics and Gynecology

## 2011-12-27 ENCOUNTER — Telehealth: Payer: Self-pay | Admitting: Obstetrics and Gynecology

## 2011-12-27 NOTE — Telephone Encounter (Signed)
Hey EP, please see pt msg.  Do you know anything about this?

## 2011-12-27 NOTE — Telephone Encounter (Signed)
Form from [facility] was completed and faxed to that office.  If patient would like to pick up the hard copy, it is available in my office.  Viki Carrera, PA-C

## 2011-12-28 NOTE — Telephone Encounter (Signed)
Tc to pt regarding msg per EP, pt voices understanding but wants to pick up hard copy of form, informed pt will be left @ front desk for pick up.

## 2012-01-05 ENCOUNTER — Other Ambulatory Visit: Payer: Medicaid Other

## 2012-01-05 ENCOUNTER — Encounter: Payer: Medicaid Other | Admitting: Obstetrics and Gynecology

## 2012-01-17 ENCOUNTER — Encounter: Payer: Medicaid Other | Admitting: Obstetrics and Gynecology

## 2012-01-26 ENCOUNTER — Encounter: Payer: Self-pay | Admitting: Pulmonary Disease

## 2012-01-26 ENCOUNTER — Ambulatory Visit (INDEPENDENT_AMBULATORY_CARE_PROVIDER_SITE_OTHER): Payer: Medicaid Other | Admitting: Pulmonary Disease

## 2012-01-26 VITALS — BP 110/68 | HR 88 | Temp 98.3°F | Ht 67.0 in | Wt 228.8 lb

## 2012-01-26 DIAGNOSIS — G4733 Obstructive sleep apnea (adult) (pediatric): Secondary | ICD-10-CM | POA: Insufficient documentation

## 2012-01-26 NOTE — Patient Instructions (Signed)
Will arrange for sleep study Will call to arrange for follow up after sleep study reviewed 

## 2012-01-26 NOTE — Progress Notes (Deleted)
  Subjective:    Patient ID: Jade Mathis, female    DOB: 12/04/1975, 36 y.o.   MRN: [id number]  HPI    Review of Systems  Constitutional: Negative for fever, appetite change and unexpected weight change.  HENT: Negative for ear pain, congestion, sore throat, sneezing, trouble swallowing and dental problem.   Respiratory: Positive for shortness of breath.   Cardiovascular: Positive for chest pain and leg swelling. Negative for palpitations.  Gastrointestinal: Negative for abdominal pain.  Musculoskeletal: Negative for joint swelling.  Skin: Negative for rash.  Neurological: Positive for headaches.  Psychiatric/Behavioral: Positive for dysphoric mood. The patient is nervous/anxious.        Objective:   Physical Exam        Assessment & Plan:

## 2012-01-26 NOTE — Assessment & Plan Note (Signed)
She reports sleep disruption and daytime sleepiness.  She had recent sleep study, and was advised she needed another sleep study to start therapy.  She likely has sleep apnea for her description.  Her second sleep study, however, is not scheduled until December 2.  I get a copy of her first sleep study.  In the meantime, I will try to arrange for titration study earlier than December 2 so that she can get started on therapy as soon as possible.  I explained to her that giving a "sleeping medicine" will not help in this situation until her probable sleep apnea is addressed first.    Also explained how any cause of sleep disruption can cause more difficulty with her fibromyalgia, headaches, and myasthenia gravis.  Driving precautions were discussed.  Discussed how sleep apnea can affect her health.
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
## 2012-01-31 ENCOUNTER — Encounter: Payer: Self-pay | Admitting: Pulmonary Disease

## 2012-02-02 ENCOUNTER — Encounter: Payer: Self-pay | Admitting: Obstetrics and Gynecology

## 2012-02-02 ENCOUNTER — Ambulatory Visit (INDEPENDENT_AMBULATORY_CARE_PROVIDER_SITE_OTHER): Payer: Medicaid Other | Admitting: Obstetrics and Gynecology

## 2012-02-02 VITALS — BP 126/84 | Ht 67.0 in | Wt 224.0 lb

## 2012-02-02 DIAGNOSIS — M25559 Pain in unspecified hip: Secondary | ICD-10-CM

## 2012-02-02 DIAGNOSIS — Z309 Encounter for contraceptive management, unspecified: Secondary | ICD-10-CM

## 2012-02-02 DIAGNOSIS — IMO0001 Reserved for inherently not codable concepts without codable children: Secondary | ICD-10-CM

## 2012-02-02 MED ORDER — OXYCODONE-ACETAMINOPHEN 5-325 MG PO TABS: 1.0000 | ORAL_TABLET | Freq: Four times a day (QID) | ORAL | Status: DC | PRN

## 2012-02-02 NOTE — Patient Instructions (Signed)

## 2012-02-02 NOTE — Progress Notes (Addendum)
Pt here to discuss getting IUD BP 126/84  Ht 5\' 7"  (1.702 m)  Wt 224 lb (101.606 kg)  BMI 35.08 kg/m2  LMP 01/18/2012 Pt states she no longer wants to get pregnant.  She wants and IUD.  She had mirena before and had no problems.  She also c/o bilateral hip pain for several months.  Tylenol does not help.  She has had no recent falls Physical Examination: General appearance - alert, well appearing, and in no distress Heart - normal rate, regular rhythm, normal S1, S2, no murmurs, rubs, clicks or gallops Abdomen - soft, nontender, nondistended, no masses or organomegaly Pelvic - normal external genitalia, vulva, vagina, cervix, uterus and adnexa Musculoskeletal - no joint tenderness, deformity or swelling, full range of motion without pain Pelvic US WNL Contraception Bilateral hip pain Percocet rx given Refer to Dr Althea Charon cx done RT for IUD.  Need to have protected IC for 2 weeks

## 2012-02-02 NOTE — Progress Notes (Deleted)
Subjective:     Patient ID: Jade Mathis, female   DOB: 04/19/1975, 36 y.o.   MRN: [id number]  HPI   Review of Systems     Objective:   Physical Exam     Assessment:     ***    Plan:     ***

## 2012-02-03 LAB — GC/CHLAMYDIA PROBE AMP
CT Probe RNA: NEGATIVE
GC Probe RNA: NEGATIVE

## 2012-02-05 ENCOUNTER — Telehealth: Payer: Self-pay

## 2012-02-05 NOTE — Telephone Encounter (Signed)
Message copied by Rolla Plate on Mon Feb 05, 2012 10:36 AM ------      Message from: Jaymes Graff      Created: Fri Feb 02, 2012 11:13 AM       Refer to dr Althea Charon for bilateral hip pain

## 2012-02-05 NOTE — Telephone Encounter (Signed)
Spoke with pt informed referred to Dr.Mckinnley for bilateral hip pain pt has appt 02/07/12 at 9:45 pt voice understanding

## 2012-02-09 ENCOUNTER — Other Ambulatory Visit: Payer: Self-pay | Admitting: Infectious Diseases

## 2012-02-09 ENCOUNTER — Ambulatory Visit
Admission: RE | Admit: 2012-02-09 | Discharge: 2012-02-09 | Disposition: A | Discharge status: Home / Self Care | Payer: No Typology Code available for payment source | Source: Ambulatory Visit | Attending: Infectious Diseases | Admitting: Infectious Diseases

## 2012-02-09 DIAGNOSIS — R7611 Nonspecific reaction to tuberculin skin test without active tuberculosis: Secondary | ICD-10-CM

## 2012-02-20 ENCOUNTER — Encounter: Payer: Medicaid Other | Admitting: Obstetrics and Gynecology

## 2012-02-20 ENCOUNTER — Ambulatory Visit: Payer: Medicaid Other | Attending: Pain Medicine | Admitting: Physical Therapy

## 2012-02-20 DIAGNOSIS — M2550 Pain in unspecified joint: Secondary | ICD-10-CM | POA: Insufficient documentation

## 2012-02-20 DIAGNOSIS — M2560 Stiffness of unspecified joint, not elsewhere classified: Secondary | ICD-10-CM | POA: Insufficient documentation

## 2012-02-20 DIAGNOSIS — IMO0001 Reserved for inherently not codable concepts without codable children: Secondary | ICD-10-CM | POA: Insufficient documentation

## 2012-02-21 ENCOUNTER — Emergency Department (HOSPITAL_COMMUNITY)
Admission: EM | Admit: 2012-02-21 | Discharge: 2012-02-21 | Disposition: A | Discharge status: Home / Self Care | Payer: No Typology Code available for payment source | Attending: Emergency Medicine | Admitting: Emergency Medicine

## 2012-02-21 ENCOUNTER — Encounter (HOSPITAL_COMMUNITY): Payer: Self-pay | Admitting: *Deleted

## 2012-02-21 ENCOUNTER — Emergency Department (HOSPITAL_COMMUNITY): Payer: No Typology Code available for payment source

## 2012-02-21 DIAGNOSIS — IMO0001 Reserved for inherently not codable concepts without codable children: Secondary | ICD-10-CM | POA: Insufficient documentation

## 2012-02-21 DIAGNOSIS — F3289 Other specified depressive episodes: Secondary | ICD-10-CM | POA: Insufficient documentation

## 2012-02-21 DIAGNOSIS — N979 Female infertility, unspecified: Secondary | ICD-10-CM | POA: Insufficient documentation

## 2012-02-21 DIAGNOSIS — I10 Essential (primary) hypertension: Secondary | ICD-10-CM | POA: Insufficient documentation

## 2012-02-21 DIAGNOSIS — F329 Major depressive disorder, single episode, unspecified: Secondary | ICD-10-CM | POA: Insufficient documentation

## 2012-02-21 DIAGNOSIS — B0089 Other herpesviral infection: Secondary | ICD-10-CM | POA: Insufficient documentation

## 2012-02-21 DIAGNOSIS — Z8742 Personal history of other diseases of the female genital tract: Secondary | ICD-10-CM | POA: Insufficient documentation

## 2012-02-21 DIAGNOSIS — S139XXA Sprain of joints and ligaments of unspecified parts of neck, initial encounter: Secondary | ICD-10-CM | POA: Insufficient documentation

## 2012-02-21 DIAGNOSIS — G7000 Myasthenia gravis without (acute) exacerbation: Secondary | ICD-10-CM | POA: Insufficient documentation

## 2012-02-21 DIAGNOSIS — J45909 Unspecified asthma, uncomplicated: Secondary | ICD-10-CM | POA: Insufficient documentation

## 2012-02-21 DIAGNOSIS — E0500 Thyrotoxicosis with diffuse goiter without thyrotoxic crisis or storm: Secondary | ICD-10-CM | POA: Insufficient documentation

## 2012-02-21 DIAGNOSIS — F3160 Bipolar disorder, current episode mixed, unspecified: Secondary | ICD-10-CM | POA: Insufficient documentation

## 2012-02-21 DIAGNOSIS — E119 Type 2 diabetes mellitus without complications: Secondary | ICD-10-CM | POA: Insufficient documentation

## 2012-02-21 DIAGNOSIS — T148XXA Other injury of unspecified body region, initial encounter: Secondary | ICD-10-CM

## 2012-02-21 DIAGNOSIS — Y9389 Activity, other specified: Secondary | ICD-10-CM | POA: Insufficient documentation

## 2012-02-21 DIAGNOSIS — S6390XA Sprain of unspecified part of unspecified wrist and hand, initial encounter: Secondary | ICD-10-CM | POA: Insufficient documentation

## 2012-02-21 DIAGNOSIS — S161XXA Strain of muscle, fascia and tendon at neck level, initial encounter: Secondary | ICD-10-CM

## 2012-02-21 DIAGNOSIS — Z8669 Personal history of other diseases of the nervous system and sense organs: Secondary | ICD-10-CM | POA: Insufficient documentation

## 2012-02-21 DIAGNOSIS — Z79899 Other long term (current) drug therapy: Secondary | ICD-10-CM | POA: Insufficient documentation

## 2012-02-21 DIAGNOSIS — Y92410 Unspecified street and highway as the place of occurrence of the external cause: Secondary | ICD-10-CM | POA: Insufficient documentation

## 2012-02-21 DIAGNOSIS — G500 Trigeminal neuralgia: Secondary | ICD-10-CM | POA: Insufficient documentation

## 2012-02-21 MED ORDER — MORPHINE SULFATE 4 MG/ML IJ SOLN
4.0000 mg | Freq: Once | INTRAMUSCULAR | Status: AC
  Administered 2012-02-21: 4 mg via INTRAMUSCULAR
  Filled 2012-02-21: qty 1

## 2012-02-21 MED ORDER — METHOCARBAMOL 500 MG PO TABS: 500.0000 mg | ORAL_TABLET | Freq: Two times a day (BID) | ORAL | Status: DC
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
## 2012-02-22 NOTE — ED Provider Notes (Signed)
Medical screening examination/treatment/procedure(s) were performed by non-physician practitioner and as supervising physician I was immediately available for consultation/collaboration.   Laray Anger, DO 02/22/12 1526

## 2012-02-28 ENCOUNTER — Ambulatory Visit: Payer: Medicaid Other | Admitting: Physical Therapy

## 2012-03-06 ENCOUNTER — Encounter: Payer: Medicaid Other | Admitting: Physical Therapy

## 2012-03-07 ENCOUNTER — Other Ambulatory Visit: Payer: Self-pay

## 2012-03-07 ENCOUNTER — Encounter: Payer: Self-pay | Admitting: Obstetrics and Gynecology

## 2012-03-07 ENCOUNTER — Ambulatory Visit (INDEPENDENT_AMBULATORY_CARE_PROVIDER_SITE_OTHER): Payer: Medicaid Other | Admitting: Obstetrics and Gynecology

## 2012-03-07 VITALS — BP 120/78 | Wt 232.0 lb

## 2012-03-07 DIAGNOSIS — IMO0001 Reserved for inherently not codable concepts without codable children: Secondary | ICD-10-CM

## 2012-03-07 DIAGNOSIS — Z30430 Encounter for insertion of intrauterine contraceptive device: Secondary | ICD-10-CM

## 2012-03-07 LAB — POCT URINE PREGNANCY: Preg Test, Ur: NEGATIVE

## 2012-03-07 MED ORDER — LEVONORGESTREL 20 MCG/24HR IU IUD
INTRAUTERINE_SYSTEM | Freq: Once | INTRAUTERINE | Status: AC
  Administered 2012-03-07: 1 via INTRAUTERINE

## 2012-03-07 MED ORDER — OXYCODONE-ACETAMINOPHEN 7.5-325 MG PO TABS: 1.0000 | ORAL_TABLET | Freq: Three times a day (TID) | ORAL | Status: DC

## 2012-03-07 NOTE — Patient Instructions (Signed)
Intrauterine Device Insertion Care After Refer to this sheet in the next few weeks. These instructions provide you with information on caring for yourself after your procedure. Your caregiver may also give you more specific instructions. Your treatment has been planned according to current medical practices, but problems sometimes occur. Call your caregiver if you have any problems or questions after your procedure. HOME CARE INSTRUCTIONS   Only take over-the-counter or prescription medicines for pain, discomfort, or fever as directed by your caregiver. Do not use aspirin. This may increase bleeding.  Check your IUD to make sure it is in place before you resume sexual activity. You should be able to feel the strings. If you cannot feel the strings, something may be wrong. The IUD may have fallen out of the uterus, or the uterus may have been punctured (perforated) during placement. Also, if the strings are getting longer, it may mean that the IUD is being forced out of the uterus. You no longer have full protection from pregnancy if any of these problems occur.  You may resume sexual intercourse if you are not having problems with the IUD. The IUD is considered immediately effective.  You may resume normal activities.  Keep all follow-up appointments to be sure your IUD has remained in place. After the first exam, yearly exams are advised, unless you cannot feel the strings of your IUD.  Continue to check that the IUD is still in place by feeling for the strings after every menstrual period. SEEK MEDICAL CARE IF:   You have bleeding that is heavier or lasts longer than a normal menstrual cycle.  You have a fever.  You have increasing cramps or abdominal pain not relieved with medicine.  You have abdominal pain that does not seem to be related to the same area of earlier cramping and pain.  You are lightheaded, unusually weak, or faint.  You have abnormal vaginal discharge or  smells.  You have pain during sexual intercourse.  You cannot feel the IUD strings, or the IUD string has gotten longer.  You feel the IUD at the opening of the cervix in the vagina.  You think you are pregnant, or you miss your menstrual period.  The IUD string is hurting your sex partner. Document Released: 11/02/2010 Document Revised: 05/29/2011 Document Reviewed: 11/02/2010 [employer] Patient Information 2013 [employer].  

## 2012-03-07 NOTE — Progress Notes (Signed)
IUD: MIRENA LOT#: [id number] Exp: 08/16 UPT: negative GC/CHLAMYDIA: negative CONSENT SIGNED: yes DISINFECTION WITH betadine X3 UTERUS SOUNDED AT 7 CM IUD INSERTED PER PROTOCOL: yes COMPLICATION: no PATIENT INSTRUCTED TO CALL IS FEVER OR ABNORMAL PAIN:yes PATIENT INSTRUCTED ON HOW TO CHECK IUD STRINGS: yes FOLLOW UP APPT: 4 weeks

## 2012-03-11 ENCOUNTER — Encounter: Payer: Medicaid Other | Admitting: Physical Therapy

## 2012-04-05 ENCOUNTER — Encounter: Payer: Medicaid Other | Admitting: Obstetrics and Gynecology

## 2012-04-24 ENCOUNTER — Ambulatory Visit: Payer: Medicaid Other | Admitting: Obstetrics and Gynecology

## 2012-04-24 ENCOUNTER — Encounter: Payer: Self-pay | Admitting: Obstetrics and Gynecology

## 2012-04-24 ENCOUNTER — Ambulatory Visit: Payer: Medicaid Other

## 2012-04-24 VITALS — BP 122/80 | Ht 67.0 in | Wt 235.0 lb

## 2012-04-24 DIAGNOSIS — R102 Pelvic and perineal pain: Secondary | ICD-10-CM

## 2012-04-24 DIAGNOSIS — Z975 Presence of (intrauterine) contraceptive device: Secondary | ICD-10-CM

## 2012-04-24 NOTE — Progress Notes (Signed)
  BP 122/80  Ht 5\' 7"  (1.702 m)  Wt 235 lb (106.595 kg)  BMI 36.81 kg/m2  Complaints: pt c/o pain on both sides Strings Visualized:yes Normal Bimanual:yes Other: pt with pelvic pain that is worse since the placement of mirena Follow up with AEX:no Other: [place] today normal.  IUD in place.  F/u 6 weeks .  Pt taking vicodin and goody powder prn.  She is still grieving the loss of her father.  She has not had any HI or SI she just joined a grief group

## 2012-07-09 ENCOUNTER — Emergency Department (HOSPITAL_COMMUNITY)
Admission: EM | Admit: 2012-07-09 | Discharge: 2012-07-09 | Disposition: A | Discharge status: Home / Self Care | Payer: Medicaid Other | Attending: Emergency Medicine | Admitting: Emergency Medicine

## 2012-07-09 ENCOUNTER — Encounter (HOSPITAL_COMMUNITY): Payer: Self-pay | Admitting: *Deleted

## 2012-07-09 ENCOUNTER — Emergency Department (HOSPITAL_COMMUNITY): Payer: Medicaid Other

## 2012-07-09 DIAGNOSIS — E119 Type 2 diabetes mellitus without complications: Secondary | ICD-10-CM | POA: Insufficient documentation

## 2012-07-09 DIAGNOSIS — Z8739 Personal history of other diseases of the musculoskeletal system and connective tissue: Secondary | ICD-10-CM | POA: Insufficient documentation

## 2012-07-09 DIAGNOSIS — Z79899 Other long term (current) drug therapy: Secondary | ICD-10-CM | POA: Insufficient documentation

## 2012-07-09 DIAGNOSIS — Z8669 Personal history of other diseases of the nervous system and sense organs: Secondary | ICD-10-CM | POA: Insufficient documentation

## 2012-07-09 DIAGNOSIS — I10 Essential (primary) hypertension: Secondary | ICD-10-CM | POA: Insufficient documentation

## 2012-07-09 DIAGNOSIS — R51 Headache: Secondary | ICD-10-CM

## 2012-07-09 DIAGNOSIS — R112 Nausea with vomiting, unspecified: Secondary | ICD-10-CM | POA: Insufficient documentation

## 2012-07-09 DIAGNOSIS — F319 Bipolar disorder, unspecified: Secondary | ICD-10-CM | POA: Insufficient documentation

## 2012-07-09 DIAGNOSIS — J45909 Unspecified asthma, uncomplicated: Secondary | ICD-10-CM | POA: Insufficient documentation

## 2012-07-09 DIAGNOSIS — Z8679 Personal history of other diseases of the circulatory system: Secondary | ICD-10-CM | POA: Insufficient documentation

## 2012-07-09 DIAGNOSIS — R1110 Vomiting, unspecified: Secondary | ICD-10-CM

## 2012-07-09 DIAGNOSIS — E0500 Thyrotoxicosis with diffuse goiter without thyrotoxic crisis or storm: Secondary | ICD-10-CM | POA: Insufficient documentation

## 2012-07-09 DIAGNOSIS — R630 Anorexia: Secondary | ICD-10-CM | POA: Insufficient documentation

## 2012-07-09 DIAGNOSIS — Z8742 Personal history of other diseases of the female genital tract: Secondary | ICD-10-CM | POA: Insufficient documentation

## 2012-07-09 DIAGNOSIS — M542 Cervicalgia: Secondary | ICD-10-CM | POA: Insufficient documentation

## 2012-07-09 DIAGNOSIS — Z8619 Personal history of other infectious and parasitic diseases: Secondary | ICD-10-CM | POA: Insufficient documentation

## 2012-07-09 HISTORY — DX: Dizziness and giddiness: R42

## 2012-07-09 LAB — BASIC METABOLIC PANEL
BUN: 15 mg/dL (ref 6–23)
CO2: 29 mEq/L (ref 19–32)
Calcium: 9.5 mg/dL (ref 8.4–10.5)
Chloride: 102 mEq/L (ref 96–112)
Creatinine, Ser: 0.68 mg/dL (ref 0.50–1.10)
GFR calc Af Amer: >90 mL/min (ref >90)
GFR calc non Af Amer: >90 mL/min (ref >90)
Glucose, Bld: 119 mg/dL — ABNORMAL HIGH (ref 70–99)
Potassium: 3.3 mEq/L — ABNORMAL LOW (ref 3.5–5.1)
Sodium: 138 mEq/L (ref 135–145)

## 2012-07-09 LAB — HEPATIC FUNCTION PANEL
ALT: 21 U/L (ref 0–35)
AST: 17 U/L (ref 0–37)
Albumin: 3.3 g/dL — ABNORMAL LOW (ref 3.5–5.2)
Alkaline Phosphatase: 87 U/L (ref 39–117)
Bilirubin, Direct: <0.1 mg/dL (ref 0.0–0.3)
Total Bilirubin: 0.3 mg/dL (ref 0.3–1.2)
Total Protein: 7.3 g/dL (ref 6.0–8.3)

## 2012-07-09 LAB — CBC
HCT: 37.5 % (ref 36.0–46.0)
Hemoglobin: 12 g/dL (ref 12.0–15.0)
MCH: 25.8 pg — ABNORMAL LOW (ref 26.0–34.0)
MCHC: 32 g/dL (ref 30.0–36.0)
MCV: 80.6 fL (ref 78.0–100.0)
Platelets: 275 10*3/uL (ref 150–400)
RBC: 4.65 MIL/uL (ref 3.87–5.11)
RDW: 13.9 % (ref 11.5–15.5)
WBC: 7.5 10*3/uL (ref 4.0–10.5)

## 2012-07-09 LAB — LIPASE, BLOOD: Lipase: 31 U/L (ref 11–59)

## 2012-07-09 MED ORDER — RANITIDINE HCL 150 MG PO TABS: 150.0000 mg | ORAL_TABLET | Freq: Two times a day (BID) | ORAL | Status: DC

## 2012-07-09 MED ORDER — METOCLOPRAMIDE HCL 5 MG/ML IJ SOLN
10.0000 mg | Freq: Once | INTRAMUSCULAR | Status: AC
  Administered 2012-07-09: 10 mg via INTRAVENOUS
  Filled 2012-07-09: qty 2

## 2012-07-09 MED ORDER — PROMETHAZINE HCL 25 MG PO TABS: 25.0000 mg | ORAL_TABLET | Freq: Four times a day (QID) | ORAL | Status: DC | PRN

## 2012-07-09 MED ORDER — KETOROLAC TROMETHAMINE 30 MG/ML IJ SOLN
30.0000 mg | Freq: Once | INTRAMUSCULAR | Status: AC
  Administered 2012-07-09: 30 mg via INTRAVENOUS
  Filled 2012-07-09: qty 1

## 2012-07-09 MED ORDER — SODIUM CHLORIDE 0.9 % IV BOLUS (SEPSIS)
500.0000 mL | Freq: Once | INTRAVENOUS | Status: AC
  Administered 2012-07-09: 500 mL via INTRAVENOUS

## 2012-07-09 MED ORDER — DIPHENHYDRAMINE HCL 50 MG/ML IJ SOLN
25.0000 mg | Freq: Once | INTRAMUSCULAR | Status: AC
  Administered 2012-07-09: 25 mg via INTRAVENOUS
  Filled 2012-07-09: qty 1

## 2012-07-09 NOTE — ED Notes (Signed)
Pt c/o HA, stiff neck and "upset stomach" x's 3 weeks. Reports hx of migraines, states pain is only on left side of head and is different than typical migraine.
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
## 2012-07-17 ENCOUNTER — Emergency Department (HOSPITAL_COMMUNITY): Payer: Medicaid Other

## 2012-07-17 ENCOUNTER — Emergency Department (HOSPITAL_COMMUNITY)
Admission: EM | Admit: 2012-07-17 | Discharge: 2012-07-18 | Disposition: A | Discharge status: Home / Self Care | Payer: Medicaid Other | Attending: Emergency Medicine | Admitting: Emergency Medicine

## 2012-07-17 ENCOUNTER — Encounter (HOSPITAL_COMMUNITY): Payer: Self-pay | Admitting: *Deleted

## 2012-07-17 DIAGNOSIS — E119 Type 2 diabetes mellitus without complications: Secondary | ICD-10-CM | POA: Insufficient documentation

## 2012-07-17 DIAGNOSIS — Z8619 Personal history of other infectious and parasitic diseases: Secondary | ICD-10-CM | POA: Insufficient documentation

## 2012-07-17 DIAGNOSIS — IMO0002 Reserved for concepts with insufficient information to code with codable children: Secondary | ICD-10-CM | POA: Insufficient documentation

## 2012-07-17 DIAGNOSIS — J45909 Unspecified asthma, uncomplicated: Secondary | ICD-10-CM | POA: Insufficient documentation

## 2012-07-17 DIAGNOSIS — R071 Chest pain on breathing: Secondary | ICD-10-CM | POA: Insufficient documentation

## 2012-07-17 DIAGNOSIS — R079 Chest pain, unspecified: Secondary | ICD-10-CM

## 2012-07-17 DIAGNOSIS — Z79899 Other long term (current) drug therapy: Secondary | ICD-10-CM | POA: Insufficient documentation

## 2012-07-17 DIAGNOSIS — F319 Bipolar disorder, unspecified: Secondary | ICD-10-CM | POA: Insufficient documentation

## 2012-07-17 DIAGNOSIS — Z3202 Encounter for pregnancy test, result negative: Secondary | ICD-10-CM | POA: Insufficient documentation

## 2012-07-17 DIAGNOSIS — I10 Essential (primary) hypertension: Secondary | ICD-10-CM | POA: Insufficient documentation

## 2012-07-17 DIAGNOSIS — Z8739 Personal history of other diseases of the musculoskeletal system and connective tissue: Secondary | ICD-10-CM | POA: Insufficient documentation

## 2012-07-17 DIAGNOSIS — Z8669 Personal history of other diseases of the nervous system and sense organs: Secondary | ICD-10-CM | POA: Insufficient documentation

## 2012-07-17 DIAGNOSIS — R0789 Other chest pain: Secondary | ICD-10-CM

## 2012-07-17 LAB — URINALYSIS, ROUTINE W REFLEX MICROSCOPIC
Bilirubin Urine: NEGATIVE
Glucose, UA: NEGATIVE mg/dL
Hgb urine dipstick: NEGATIVE
Ketones, ur: NEGATIVE mg/dL
Leukocytes, UA: NEGATIVE
Nitrite: NEGATIVE
Protein, ur: NEGATIVE mg/dL
Specific Gravity, Urine: 1.017 (ref 1.005–1.030)
Urobilinogen, UA: 1 mg/dL (ref 0.0–1.0)
pH: 5.5 (ref 5.0–8.0)

## 2012-07-17 LAB — POCT I-STAT, CHEM 8
BUN: 12 mg/dL (ref 6–23)
Calcium, Ion: 1.22 mmol/L (ref 1.12–1.23)
Chloride: 100 mEq/L (ref 96–112)
Creatinine, Ser: 0.8 mg/dL (ref 0.50–1.10)
Glucose, Bld: 120 mg/dL — ABNORMAL HIGH (ref 70–99)
HCT: 41 % (ref 36.0–46.0)
Hemoglobin: 13.9 g/dL (ref 12.0–15.0)
Potassium: 3.2 mEq/L — ABNORMAL LOW (ref 3.5–5.1)
Sodium: 140 mEq/L (ref 135–145)
TCO2: 31 mmol/L (ref 0–100)

## 2012-07-17 LAB — D-DIMER, QUANTITATIVE: D-Dimer, Quant: 0.91 ug/mL-FEU — ABNORMAL HIGH (ref 0.00–0.48)

## 2012-07-17 LAB — POCT PREGNANCY, URINE: Preg Test, Ur: NEGATIVE

## 2012-07-17 LAB — POCT I-STAT TROPONIN I: Troponin i, poc: 0 ng/mL (ref 0.00–0.08)

## 2012-07-17 MED ORDER — ONDANSETRON 4 MG PO TBDP
4.0000 mg | ORAL_TABLET | Freq: Once | ORAL | Status: DC
  Filled 2012-07-17: qty 1

## 2012-07-17 MED ORDER — IOHEXOL 350 MG/ML SOLN
100.0000 mL | Freq: Once | INTRAVENOUS | Status: AC | PRN
  Administered 2012-07-17: 100 mL via INTRAVENOUS

## 2012-07-17 MED ORDER — ACETAMINOPHEN 325 MG PO TABS
650.0000 mg | ORAL_TABLET | Freq: Once | ORAL | Status: DC
  Filled 2012-07-17: qty 2

## 2012-07-17 NOTE — ED Notes (Signed)
Pt c/o sudden onset of CP 2 hours ago.  Began experiencing CP after administering nebulizer by self at home.  Denies SOB, vomiting.

## 2012-07-17 NOTE — ED Notes (Signed)
Patient transported to X-ray 

## 2012-07-17 NOTE — ED Notes (Signed)
Lab at bedside to draw blood and pt states she does not know her birthdate and for lab to look at her armband.  Lab attempting to correctly identify pt.  Verlon Au, Consulting civil engineer at bedside speaking with pt concerning the need for lab to verify her name and DOB.  Pt states both to charge RN.  Pt states she does not want to see the doctor that came in her room.  Pt states that she is upset because she asked EMS to take her to [facility] but they said they had to bring her here due to her chest pain.  Pt states she does not want the doctor to come back in her room because he asked her what her chief complaint was and told her that she would need to see a primary care doctor for part of her complaints.  Pt refuses Tylenol and Zofran and states that they will make her sicker and that she could have taken them at home.

## 2012-07-17 NOTE — ED Notes (Signed)
Patient transported to CT 
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
## 2012-07-18 ENCOUNTER — Encounter (HOSPITAL_COMMUNITY): Payer: Self-pay | Admitting: Radiology

## 2012-07-18 ENCOUNTER — Emergency Department (HOSPITAL_COMMUNITY)
Admission: EM | Admit: 2012-07-18 | Discharge: 2012-07-18 | Disposition: A | Discharge status: Home / Self Care | Payer: Medicaid Other | Attending: Emergency Medicine | Admitting: Emergency Medicine

## 2012-07-18 ENCOUNTER — Encounter (HOSPITAL_COMMUNITY): Payer: Self-pay | Admitting: Emergency Medicine

## 2012-07-18 ENCOUNTER — Encounter (HOSPITAL_COMMUNITY): Payer: Self-pay | Admitting: *Deleted

## 2012-07-18 DIAGNOSIS — G7000 Myasthenia gravis without (acute) exacerbation: Secondary | ICD-10-CM | POA: Insufficient documentation

## 2012-07-18 DIAGNOSIS — R079 Chest pain, unspecified: Secondary | ICD-10-CM | POA: Insufficient documentation

## 2012-07-18 DIAGNOSIS — Z8619 Personal history of other infectious and parasitic diseases: Secondary | ICD-10-CM | POA: Insufficient documentation

## 2012-07-18 DIAGNOSIS — Z8669 Personal history of other diseases of the nervous system and sense organs: Secondary | ICD-10-CM | POA: Insufficient documentation

## 2012-07-18 DIAGNOSIS — I10 Essential (primary) hypertension: Secondary | ICD-10-CM | POA: Insufficient documentation

## 2012-07-18 DIAGNOSIS — Z8739 Personal history of other diseases of the musculoskeletal system and connective tissue: Secondary | ICD-10-CM | POA: Insufficient documentation

## 2012-07-18 DIAGNOSIS — E119 Type 2 diabetes mellitus without complications: Secondary | ICD-10-CM | POA: Insufficient documentation

## 2012-07-18 DIAGNOSIS — R51 Headache: Secondary | ICD-10-CM | POA: Insufficient documentation

## 2012-07-18 DIAGNOSIS — R110 Nausea: Secondary | ICD-10-CM | POA: Insufficient documentation

## 2012-07-18 DIAGNOSIS — J45909 Unspecified asthma, uncomplicated: Secondary | ICD-10-CM | POA: Insufficient documentation

## 2012-07-18 DIAGNOSIS — F319 Bipolar disorder, unspecified: Secondary | ICD-10-CM | POA: Insufficient documentation

## 2012-07-18 DIAGNOSIS — Z79899 Other long term (current) drug therapy: Secondary | ICD-10-CM | POA: Insufficient documentation

## 2012-07-18 DIAGNOSIS — E0500 Thyrotoxicosis with diffuse goiter without thyrotoxic crisis or storm: Secondary | ICD-10-CM | POA: Insufficient documentation

## 2012-07-18 DIAGNOSIS — Z8742 Personal history of other diseases of the female genital tract: Secondary | ICD-10-CM | POA: Insufficient documentation

## 2012-07-18 LAB — TROPONIN I: Troponin I: <0.3 ng/mL (ref <0.30)

## 2012-07-18 MED ORDER — KETOROLAC TROMETHAMINE 30 MG/ML IJ SOLN
30.0000 mg | Freq: Once | INTRAMUSCULAR | Status: AC
  Administered 2012-07-18: 30 mg via INTRAMUSCULAR
  Filled 2012-07-18: qty 1

## 2012-07-18 MED ORDER — DIPHENHYDRAMINE HCL 25 MG PO CAPS
25.0000 mg | ORAL_CAPSULE | Freq: Once | ORAL | Status: AC
  Administered 2012-07-18: 25 mg via ORAL
  Filled 2012-07-18: qty 1

## 2012-07-18 MED ORDER — NAPROXEN 500 MG PO TABS: 500.0000 mg | ORAL_TABLET | Freq: Two times a day (BID) | ORAL | Status: DC

## 2012-07-18 MED ORDER — SODIUM CHLORIDE 0.9 % IV BOLUS (SEPSIS): 1000.0000 mL | Freq: Once | INTRAVENOUS | Status: DC

## 2012-07-18 MED ORDER — SODIUM CHLORIDE 0.9 % IV BOLUS (SEPSIS)
1000.0000 mL | Freq: Once | INTRAVENOUS | Status: AC
  Administered 2012-07-18: 1000 mL via INTRAVENOUS

## 2012-07-18 MED ORDER — METOCLOPRAMIDE HCL 5 MG/ML IJ SOLN
10.0000 mg | Freq: Once | INTRAMUSCULAR | Status: AC
  Administered 2012-07-18: 10 mg via INTRAMUSCULAR
  Filled 2012-07-18: qty 2

## 2012-07-18 NOTE — ED Notes (Signed)
Pt sts she does not want an EKG due to the fact that she already had one [facility]
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
## 2012-07-18 NOTE — ED Provider Notes (Signed)
EKG:  Rhythm: normal sinus Vent. rate 90 BPM PR interval 128 ms QRS duration 96 ms QT/QTc 364/445 ms ST segments: NS ST changes Comparison: ~4 hours ago.  No significant interval change.   Raeford Razor, MD 07/18/12 [phone]

## 2012-07-18 NOTE — ED Notes (Signed)
Pt notified that MD would be in soon to speak with her.

## 2012-07-18 NOTE — ED Provider Notes (Signed)
I saw and evaluated the patient, reviewed the resident's note and I agree with the findings and plan. If applicable, I agree with the resident's interpretation of the EKG.  If applicable, I was present for critical portions of any procedures performed.  R sided chest pain x 3 hours, worse with palpation. Gradual onset headache similar to previous. RRR, CTAB, R chest wall tenderness without rash.  Glynn Octave, MD 07/18/12 [phone]

## 2012-07-18 NOTE — ED Notes (Signed)
Pt states she just left [facility] 20 minutes ago for chest pain, nausea and headache, states "I wasn't given anything for pain, they tried giving me tylenol but I can take that at home". Pt states "all they did was a CT scan and xray, and they told me everything looked fine". Pt agitated in triage. States chest pain started tonight around 8 pm, right chest, nausea present, denies SOB/dizziness or lightheadedness. Pt states she had a breathing tx earlier.

## 2012-07-26 ENCOUNTER — Encounter (HOSPITAL_COMMUNITY): Payer: Self-pay | Admitting: Emergency Medicine

## 2012-07-26 ENCOUNTER — Emergency Department (HOSPITAL_COMMUNITY)
Admission: EM | Admit: 2012-07-26 | Discharge: 2012-07-27 | Disposition: A | Discharge status: Home / Self Care | Payer: Medicaid Other | Attending: Emergency Medicine | Admitting: Emergency Medicine

## 2012-07-26 DIAGNOSIS — G43909 Migraine, unspecified, not intractable, without status migrainosus: Secondary | ICD-10-CM

## 2012-07-26 DIAGNOSIS — Z8742 Personal history of other diseases of the female genital tract: Secondary | ICD-10-CM | POA: Insufficient documentation

## 2012-07-26 DIAGNOSIS — Z79899 Other long term (current) drug therapy: Secondary | ICD-10-CM | POA: Insufficient documentation

## 2012-07-26 DIAGNOSIS — Z8619 Personal history of other infectious and parasitic diseases: Secondary | ICD-10-CM | POA: Insufficient documentation

## 2012-07-26 DIAGNOSIS — R110 Nausea: Secondary | ICD-10-CM | POA: Insufficient documentation

## 2012-07-26 DIAGNOSIS — F319 Bipolar disorder, unspecified: Secondary | ICD-10-CM | POA: Insufficient documentation

## 2012-07-26 DIAGNOSIS — Z8639 Personal history of other endocrine, nutritional and metabolic disease: Secondary | ICD-10-CM | POA: Insufficient documentation

## 2012-07-26 DIAGNOSIS — IMO0002 Reserved for concepts with insufficient information to code with codable children: Secondary | ICD-10-CM | POA: Insufficient documentation

## 2012-07-26 DIAGNOSIS — Z9149 Other personal history of psychological trauma, not elsewhere classified: Secondary | ICD-10-CM | POA: Insufficient documentation

## 2012-07-26 DIAGNOSIS — H53149 Visual discomfort, unspecified: Secondary | ICD-10-CM | POA: Insufficient documentation

## 2012-07-26 DIAGNOSIS — J45909 Unspecified asthma, uncomplicated: Secondary | ICD-10-CM | POA: Insufficient documentation

## 2012-07-26 DIAGNOSIS — Z789 Other specified health status: Secondary | ICD-10-CM | POA: Insufficient documentation

## 2012-07-26 DIAGNOSIS — I10 Essential (primary) hypertension: Secondary | ICD-10-CM | POA: Insufficient documentation

## 2012-07-26 DIAGNOSIS — IMO0001 Reserved for inherently not codable concepts without codable children: Secondary | ICD-10-CM | POA: Insufficient documentation

## 2012-07-26 DIAGNOSIS — Z8669 Personal history of other diseases of the nervous system and sense organs: Secondary | ICD-10-CM | POA: Insufficient documentation

## 2012-07-26 DIAGNOSIS — E119 Type 2 diabetes mellitus without complications: Secondary | ICD-10-CM | POA: Insufficient documentation

## 2012-07-26 DIAGNOSIS — G7000 Myasthenia gravis without (acute) exacerbation: Secondary | ICD-10-CM | POA: Insufficient documentation

## 2012-07-26 DIAGNOSIS — Z862 Personal history of diseases of the blood and blood-forming organs and certain disorders involving the immune mechanism: Secondary | ICD-10-CM | POA: Insufficient documentation

## 2012-07-26 MED ORDER — METOCLOPRAMIDE HCL 10 MG PO TABS
10.0000 mg | ORAL_TABLET | Freq: Once | ORAL | Status: AC
  Administered 2012-07-26: 10 mg via ORAL
  Filled 2012-07-26 (×3): qty 1

## 2012-07-26 MED ORDER — DIPHENHYDRAMINE HCL 25 MG PO CAPS
25.0000 mg | ORAL_CAPSULE | Freq: Once | ORAL | Status: AC
  Administered 2012-07-26: 25 mg via ORAL
  Filled 2012-07-26: qty 1

## 2012-07-26 MED ORDER — DEXAMETHASONE SODIUM PHOSPHATE 10 MG/ML IJ SOLN
10.0000 mg | Freq: Once | INTRAMUSCULAR | Status: AC
  Administered 2012-07-26: 10 mg via INTRAMUSCULAR
  Filled 2012-07-26: qty 1

## 2012-07-26 MED ORDER — KETOROLAC TROMETHAMINE 60 MG/2ML IM SOLN
60.0000 mg | Freq: Once | INTRAMUSCULAR | Status: AC
  Administered 2012-07-26: 60 mg via INTRAMUSCULAR
  Filled 2012-07-26: qty 2

## 2012-07-26 NOTE — ED Notes (Signed)
Pt c/o of headache, when ask onset, pt states all day every day. Pt states she is hurting worse today, +nausea.
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
## 2012-07-27 NOTE — ED Notes (Signed)
Patient refused to sign the information. The patient states that her pain remains a 10/10 and she wants to leave. MD aware

## 2012-09-23 ENCOUNTER — Other Ambulatory Visit: Payer: Self-pay | Admitting: Endocrinology

## 2012-09-23 NOTE — Telephone Encounter (Signed)
Ok to send

## 2012-09-24 ENCOUNTER — Other Ambulatory Visit: Payer: Self-pay | Admitting: Endocrinology

## 2012-09-24 MED ORDER — PHENTERMINE HCL 30 MG PO CAPS: ORAL_CAPSULE | ORAL | Status: DC

## 2012-10-11 ENCOUNTER — Emergency Department (HOSPITAL_COMMUNITY)
Admission: EM | Admit: 2012-10-11 | Discharge: 2012-10-12 | Disposition: A | Discharge status: Home / Self Care | Payer: Medicaid Other | Attending: Emergency Medicine | Admitting: Emergency Medicine

## 2012-10-11 ENCOUNTER — Encounter (HOSPITAL_COMMUNITY): Payer: Self-pay | Admitting: Emergency Medicine

## 2012-10-11 DIAGNOSIS — F32A Depression, unspecified: Secondary | ICD-10-CM

## 2012-10-11 DIAGNOSIS — Z9149 Other personal history of psychological trauma, not elsewhere classified: Secondary | ICD-10-CM | POA: Insufficient documentation

## 2012-10-11 DIAGNOSIS — F911 Conduct disorder, childhood-onset type: Secondary | ICD-10-CM | POA: Insufficient documentation

## 2012-10-11 DIAGNOSIS — Z8659 Personal history of other mental and behavioral disorders: Secondary | ICD-10-CM | POA: Insufficient documentation

## 2012-10-11 DIAGNOSIS — Z8619 Personal history of other infectious and parasitic diseases: Secondary | ICD-10-CM | POA: Insufficient documentation

## 2012-10-11 DIAGNOSIS — G7000 Myasthenia gravis without (acute) exacerbation: Secondary | ICD-10-CM | POA: Insufficient documentation

## 2012-10-11 DIAGNOSIS — Z8739 Personal history of other diseases of the musculoskeletal system and connective tissue: Secondary | ICD-10-CM | POA: Insufficient documentation

## 2012-10-11 DIAGNOSIS — Z8742 Personal history of other diseases of the female genital tract: Secondary | ICD-10-CM | POA: Insufficient documentation

## 2012-10-11 DIAGNOSIS — Z79899 Other long term (current) drug therapy: Secondary | ICD-10-CM | POA: Insufficient documentation

## 2012-10-11 DIAGNOSIS — Z3202 Encounter for pregnancy test, result negative: Secondary | ICD-10-CM | POA: Insufficient documentation

## 2012-10-11 DIAGNOSIS — Z9889 Other specified postprocedural states: Secondary | ICD-10-CM | POA: Insufficient documentation

## 2012-10-11 DIAGNOSIS — E119 Type 2 diabetes mellitus without complications: Secondary | ICD-10-CM | POA: Insufficient documentation

## 2012-10-11 DIAGNOSIS — Z8639 Personal history of other endocrine, nutritional and metabolic disease: Secondary | ICD-10-CM | POA: Insufficient documentation

## 2012-10-11 DIAGNOSIS — Z8669 Personal history of other diseases of the nervous system and sense organs: Secondary | ICD-10-CM | POA: Insufficient documentation

## 2012-10-11 DIAGNOSIS — J45909 Unspecified asthma, uncomplicated: Secondary | ICD-10-CM | POA: Insufficient documentation

## 2012-10-11 DIAGNOSIS — Z862 Personal history of diseases of the blood and blood-forming organs and certain disorders involving the immune mechanism: Secondary | ICD-10-CM | POA: Insufficient documentation

## 2012-10-11 DIAGNOSIS — I10 Essential (primary) hypertension: Secondary | ICD-10-CM | POA: Insufficient documentation

## 2012-10-11 DIAGNOSIS — F329 Major depressive disorder, single episode, unspecified: Secondary | ICD-10-CM | POA: Insufficient documentation

## 2012-10-11 DIAGNOSIS — F3289 Other specified depressive episodes: Secondary | ICD-10-CM | POA: Insufficient documentation

## 2012-10-11 LAB — CBC WITH DIFFERENTIAL/PLATELET
Basophils Absolute: 0 10*3/uL (ref 0.0–0.1)
Basophils Relative: 1 % (ref 0–1)
Eosinophils Absolute: 0.3 10*3/uL (ref 0.0–0.7)
Eosinophils Relative: 4 % (ref 0–5)
HCT: 40.2 % (ref 36.0–46.0)
Hemoglobin: 12.7 g/dL (ref 12.0–15.0)
Lymphocytes Relative: 34 % (ref 12–46)
Lymphs Abs: 2.8 10*3/uL (ref 0.7–4.0)
MCH: 25.3 pg — ABNORMAL LOW (ref 26.0–34.0)
MCHC: 31.6 g/dL (ref 30.0–36.0)
MCV: 80.2 fL (ref 78.0–100.0)
Monocytes Absolute: 0.5 10*3/uL (ref 0.1–1.0)
Monocytes Relative: 7 % (ref 3–12)
Neutro Abs: 4.5 10*3/uL (ref 1.7–7.7)
Neutrophils Relative %: 55 % (ref 43–77)
Platelets: 309 10*3/uL (ref 150–400)
RBC: 5.01 MIL/uL (ref 3.87–5.11)
RDW: 14.4 % (ref 11.5–15.5)
WBC: 8.2 10*3/uL (ref 4.0–10.5)

## 2012-10-11 LAB — COMPREHENSIVE METABOLIC PANEL
ALT: 18 U/L (ref 0–35)
AST: 14 U/L (ref 0–37)
Albumin: 3.3 g/dL — ABNORMAL LOW (ref 3.5–5.2)
Alkaline Phosphatase: 101 U/L (ref 39–117)
BUN: 10 mg/dL (ref 6–23)
CO2: 28 mEq/L (ref 19–32)
Calcium: 9 mg/dL (ref 8.4–10.5)
Chloride: 97 mEq/L (ref 96–112)
Creatinine, Ser: 0.73 mg/dL (ref 0.50–1.10)
GFR calc Af Amer: >90 mL/min (ref >90)
GFR calc non Af Amer: >90 mL/min (ref >90)
Glucose, Bld: 104 mg/dL — ABNORMAL HIGH (ref 70–99)
Potassium: 3.5 mEq/L (ref 3.5–5.1)
Sodium: 134 mEq/L — ABNORMAL LOW (ref 135–145)
Total Bilirubin: 0.4 mg/dL (ref 0.3–1.2)
Total Protein: 7.5 g/dL (ref 6.0–8.3)

## 2012-10-11 LAB — GLUCOSE, CAPILLARY: Glucose-Capillary: 106 mg/dL — ABNORMAL HIGH (ref 70–99)

## 2012-10-11 LAB — RAPID URINE DRUG SCREEN, HOSP PERFORMED
Amphetamines: NOT DETECTED
Barbiturates: NOT DETECTED
Benzodiazepines: NOT DETECTED
Cocaine: NOT DETECTED
Opiates: NOT DETECTED
Tetrahydrocannabinol: NOT DETECTED

## 2012-10-11 LAB — ACETAMINOPHEN LEVEL: Acetaminophen (Tylenol), Serum: <15 ug/mL (ref 10–30)

## 2012-10-11 LAB — SALICYLATE LEVEL: Salicylate Lvl: <2 mg/dL — ABNORMAL LOW (ref 2.8–20.0)

## 2012-10-11 LAB — URINALYSIS, ROUTINE W REFLEX MICROSCOPIC
Bilirubin Urine: NEGATIVE
Glucose, UA: NEGATIVE mg/dL
Hgb urine dipstick: NEGATIVE
Ketones, ur: NEGATIVE mg/dL
Leukocytes, UA: NEGATIVE
Nitrite: NEGATIVE
Protein, ur: NEGATIVE mg/dL
Specific Gravity, Urine: 1.029 (ref 1.005–1.030)
Urobilinogen, UA: 1 mg/dL (ref 0.0–1.0)
pH: 5 (ref 5.0–8.0)

## 2012-10-11 LAB — ETHANOL: Alcohol, Ethyl (B): <11 mg/dL (ref 0–11)

## 2012-10-11 LAB — PREGNANCY, URINE: Preg Test, Ur: NEGATIVE

## 2012-10-11 MED ORDER — AMPHETAMINE-DEXTROAMPHETAMINE 30 MG PO TABS: 60.0000 mg | ORAL_TABLET | Freq: Every day | ORAL | Status: DC

## 2012-10-11 MED ORDER — AZATHIOPRINE 100 MG PO TABS
100.0000 mg | ORAL_TABLET | Freq: Every day | ORAL | Status: DC
  Filled 2012-10-11: qty 1

## 2012-10-11 MED ORDER — PHENTERMINE HCL 30 MG PO CAPS: 30.0000 mg | ORAL_CAPSULE | Freq: Every day | ORAL | Status: DC

## 2012-10-11 MED ORDER — LISINOPRIL 10 MG PO TABS
10.0000 mg | ORAL_TABLET | Freq: Every day | ORAL | Status: DC
  Filled 2012-10-11: qty 1

## 2012-10-11 MED ORDER — GLIMEPIRIDE 1 MG PO TABS
1.0000 mg | ORAL_TABLET | Freq: Every day | ORAL | Status: DC
  Filled 2012-10-11: qty 1

## 2012-10-11 MED ORDER — FAMOTIDINE 20 MG PO TABS: 10.0000 mg | ORAL_TABLET | Freq: Every day | ORAL | Status: DC

## 2012-10-11 MED ORDER — PROMETHAZINE HCL 25 MG PO TABS: 25.0000 mg | ORAL_TABLET | Freq: Four times a day (QID) | ORAL | Status: DC | PRN

## 2012-10-11 MED ORDER — PYRIDOSTIGMINE BROMIDE 60 MG PO TABS
60.0000 mg | ORAL_TABLET | Freq: Four times a day (QID) | ORAL | Status: DC
Start: 2012-10-11 — End: 2012-10-12
  Filled 2012-10-11: qty 1

## 2012-10-11 MED ORDER — ARIPIPRAZOLE 10 MG PO TABS
10.0000 mg | ORAL_TABLET | Freq: Every day | ORAL | Status: DC
  Filled 2012-10-11: qty 1

## 2012-10-11 MED ORDER — LEVOTHYROXINE SODIUM 175 MCG PO TABS
175.0000 ug | ORAL_TABLET | Freq: Every day | ORAL | Status: DC
  Filled 2012-10-11: qty 1

## 2012-10-11 MED ORDER — METFORMIN HCL 500 MG PO TABS
1000.0000 mg | ORAL_TABLET | Freq: Two times a day (BID) | ORAL | Status: DC
  Filled 2012-10-11 (×2): qty 2

## 2012-10-11 MED ORDER — ATORVASTATIN CALCIUM 10 MG PO TABS: 10.0000 mg | ORAL_TABLET | Freq: Every day | ORAL | Status: DC

## 2012-10-11 MED ORDER — PREDNISONE 20 MG PO TABS: 10.0000 mg | ORAL_TABLET | Freq: Every day | ORAL | Status: DC

## 2012-10-11 MED ORDER — PREGABALIN 50 MG PO CAPS
50.0000 mg | ORAL_CAPSULE | Freq: Three times a day (TID) | ORAL | Status: DC
  Filled 2012-10-11: qty 1

## 2012-10-11 NOTE — ED Provider Notes (Signed)
Medical screening examination/treatment/procedure(s) were performed by non-physician practitioner and as supervising physician I was immediately available for consultation/collaboration.   Gertha Lichtenberg, MD 10/11/12 2327 

## 2012-10-11 NOTE — ED Notes (Signed)
Pt brought to ED by family after being told by ACL? Pt needed to come to ED for evaluation. Pt is nonverbal, shakes head to some questions, shrugs shoulders when as if she has had SI. Family with pt, unclear of what behaviors brought pt to seek medical evaluation.

## 2012-10-11 NOTE — ED Notes (Signed)
Unable to do assessment.  Per husband, pt is not responding to anyone right now. Pt is asleep and is swinging out when disturbed.
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
## 2012-10-11 NOTE — ED Notes (Signed)
Pt disqualified for psych ED d/t cane usage, pt changed into scrubs, wanded by security and belonging sent home with husband

## 2012-10-12 NOTE — ED Provider Notes (Signed)
Medical screening examination/treatment/procedure(s) were performed by non-physician practitioner and as supervising physician I was immediately available for consultation/collaboration.   Rolan Bucco, MD 10/12/12 [phone]

## 2012-10-15 ENCOUNTER — Telehealth: Payer: Self-pay | Admitting: Endocrinology

## 2012-10-15 NOTE — Telephone Encounter (Signed)
Dr. Lucianne Muss, Pt wants to know if she can take Invokana instead of Metformin, please advise?

## 2012-10-15 NOTE — Telephone Encounter (Signed)
Usually combine them together, we'll need to discuss on her next visit depending on her diabetes assessment, she can come sooner if need be

## 2012-10-30 ENCOUNTER — Ambulatory Visit (HOSPITAL_COMMUNITY): Admission: RE | Admit: 2012-10-30 | Payer: No Typology Code available for payment source | Source: Ambulatory Visit

## 2012-10-30 ENCOUNTER — Telehealth: Payer: Self-pay | Admitting: Endocrinology

## 2012-10-30 ENCOUNTER — Emergency Department (HOSPITAL_COMMUNITY): Payer: Medicaid Other

## 2012-10-30 ENCOUNTER — Emergency Department (HOSPITAL_COMMUNITY)
Admission: EM | Admit: 2012-10-30 | Discharge: 2012-10-30 | Discharge status: Against Medical Advice | Payer: Medicaid Other | Attending: Emergency Medicine | Admitting: Emergency Medicine

## 2012-10-30 ENCOUNTER — Encounter (HOSPITAL_COMMUNITY): Payer: Self-pay | Admitting: Emergency Medicine

## 2012-10-30 DIAGNOSIS — Z862 Personal history of diseases of the blood and blood-forming organs and certain disorders involving the immune mechanism: Secondary | ICD-10-CM | POA: Insufficient documentation

## 2012-10-30 DIAGNOSIS — M25532 Pain in left wrist: Secondary | ICD-10-CM

## 2012-10-30 DIAGNOSIS — R059 Cough, unspecified: Secondary | ICD-10-CM | POA: Insufficient documentation

## 2012-10-30 DIAGNOSIS — S59909A Unspecified injury of unspecified elbow, initial encounter: Secondary | ICD-10-CM | POA: Insufficient documentation

## 2012-10-30 DIAGNOSIS — R0789 Other chest pain: Secondary | ICD-10-CM

## 2012-10-30 DIAGNOSIS — Z8639 Personal history of other endocrine, nutritional and metabolic disease: Secondary | ICD-10-CM | POA: Insufficient documentation

## 2012-10-30 DIAGNOSIS — Z8619 Personal history of other infectious and parasitic diseases: Secondary | ICD-10-CM | POA: Insufficient documentation

## 2012-10-30 DIAGNOSIS — IMO0002 Reserved for concepts with insufficient information to code with codable children: Secondary | ICD-10-CM | POA: Insufficient documentation

## 2012-10-30 DIAGNOSIS — S6990XA Unspecified injury of unspecified wrist, hand and finger(s), initial encounter: Secondary | ICD-10-CM | POA: Insufficient documentation

## 2012-10-30 DIAGNOSIS — I10 Essential (primary) hypertension: Secondary | ICD-10-CM | POA: Insufficient documentation

## 2012-10-30 DIAGNOSIS — S298XXA Other specified injuries of thorax, initial encounter: Secondary | ICD-10-CM | POA: Insufficient documentation

## 2012-10-30 DIAGNOSIS — W06XXXA Fall from bed, initial encounter: Secondary | ICD-10-CM | POA: Insufficient documentation

## 2012-10-30 DIAGNOSIS — IMO0001 Reserved for inherently not codable concepts without codable children: Secondary | ICD-10-CM | POA: Insufficient documentation

## 2012-10-30 DIAGNOSIS — Z79899 Other long term (current) drug therapy: Secondary | ICD-10-CM | POA: Insufficient documentation

## 2012-10-30 DIAGNOSIS — G43909 Migraine, unspecified, not intractable, without status migrainosus: Secondary | ICD-10-CM | POA: Insufficient documentation

## 2012-10-30 DIAGNOSIS — Z8742 Personal history of other diseases of the female genital tract: Secondary | ICD-10-CM | POA: Insufficient documentation

## 2012-10-30 DIAGNOSIS — F319 Bipolar disorder, unspecified: Secondary | ICD-10-CM | POA: Insufficient documentation

## 2012-10-30 DIAGNOSIS — Y9389 Activity, other specified: Secondary | ICD-10-CM | POA: Insufficient documentation

## 2012-10-30 DIAGNOSIS — R05 Cough: Secondary | ICD-10-CM | POA: Insufficient documentation

## 2012-10-30 DIAGNOSIS — M79642 Pain in left hand: Secondary | ICD-10-CM

## 2012-10-30 DIAGNOSIS — J45909 Unspecified asthma, uncomplicated: Secondary | ICD-10-CM | POA: Insufficient documentation

## 2012-10-30 DIAGNOSIS — W19XXXD Unspecified fall, subsequent encounter: Secondary | ICD-10-CM

## 2012-10-30 DIAGNOSIS — Z8669 Personal history of other diseases of the nervous system and sense organs: Secondary | ICD-10-CM | POA: Insufficient documentation

## 2012-10-30 DIAGNOSIS — E119 Type 2 diabetes mellitus without complications: Secondary | ICD-10-CM | POA: Insufficient documentation

## 2012-10-30 DIAGNOSIS — Y929 Unspecified place or not applicable: Secondary | ICD-10-CM | POA: Insufficient documentation

## 2012-10-30 LAB — CBC WITH DIFFERENTIAL/PLATELET
Basophils Absolute: 0 10*3/uL (ref 0.0–0.1)
Basophils Relative: 0 % (ref 0–1)
Eosinophils Absolute: 0.2 10*3/uL (ref 0.0–0.7)
Eosinophils Relative: 3 % (ref 0–5)
HCT: 37.1 % (ref 36.0–46.0)
Hemoglobin: 11.8 g/dL — ABNORMAL LOW (ref 12.0–15.0)
Lymphocytes Relative: 32 % (ref 12–46)
Lymphs Abs: 2.3 10*3/uL (ref 0.7–4.0)
MCH: 25.4 pg — ABNORMAL LOW (ref 26.0–34.0)
MCHC: 31.8 g/dL (ref 30.0–36.0)
MCV: 80 fL (ref 78.0–100.0)
Monocytes Absolute: 0.5 10*3/uL (ref 0.1–1.0)
Monocytes Relative: 6 % (ref 3–12)
Neutro Abs: 4.1 10*3/uL (ref 1.7–7.7)
Neutrophils Relative %: 58 % (ref 43–77)
Platelets: 301 10*3/uL (ref 150–400)
RBC: 4.64 MIL/uL (ref 3.87–5.11)
RDW: 14.4 % (ref 11.5–15.5)
WBC: 7 10*3/uL (ref 4.0–10.5)

## 2012-10-30 LAB — URINALYSIS, ROUTINE W REFLEX MICROSCOPIC
Bilirubin Urine: NEGATIVE
Glucose, UA: NEGATIVE mg/dL
Hgb urine dipstick: NEGATIVE
Ketones, ur: NEGATIVE mg/dL
Leukocytes, UA: NEGATIVE
Nitrite: NEGATIVE
Protein, ur: NEGATIVE mg/dL
Specific Gravity, Urine: 1.019 (ref 1.005–1.030)
Urobilinogen, UA: 1 mg/dL (ref 0.0–1.0)
pH: 6 (ref 5.0–8.0)

## 2012-10-30 LAB — COMPREHENSIVE METABOLIC PANEL
ALT: 8 U/L (ref 0–35)
AST: 11 U/L (ref 0–37)
Albumin: 3 g/dL — ABNORMAL LOW (ref 3.5–5.2)
Alkaline Phosphatase: 96 U/L (ref 39–117)
BUN: 7 mg/dL (ref 6–23)
CO2: 26 mEq/L (ref 19–32)
Calcium: 8.8 mg/dL (ref 8.4–10.5)
Chloride: 103 mEq/L (ref 96–112)
Creatinine, Ser: 0.63 mg/dL (ref 0.50–1.10)
GFR calc Af Amer: >90 mL/min (ref >90)
GFR calc non Af Amer: >90 mL/min (ref >90)
Glucose, Bld: 106 mg/dL — ABNORMAL HIGH (ref 70–99)
Potassium: 3.4 mEq/L — ABNORMAL LOW (ref 3.5–5.1)
Sodium: 135 mEq/L (ref 135–145)
Total Bilirubin: 0.3 mg/dL (ref 0.3–1.2)
Total Protein: 7 g/dL (ref 6.0–8.3)

## 2012-10-30 LAB — TROPONIN I: Troponin I: <0.3 ng/mL (ref <0.30)

## 2012-10-30 MED ORDER — KETOROLAC TROMETHAMINE 30 MG/ML IJ SOLN
60.0000 mg | Freq: Once | INTRAMUSCULAR | Status: AC
  Administered 2012-10-30: 60 mg via INTRAMUSCULAR
  Filled 2012-10-30: qty 2

## 2012-10-30 NOTE — ED Notes (Signed)
Patient refused xray, stating that her wrist needs an xray.
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
## 2012-10-30 NOTE — Telephone Encounter (Signed)
Pt scheduled appt for 8/14

## 2012-10-30 NOTE — ED Notes (Signed)
X-ray came for patient while MD was in room, stated they would come back. Patient wanted staff to have x-ray staff return immediately; patient left when staff was unable to get her to x-ray immediately. Patient left AMA and without signing out.

## 2012-10-30 NOTE — ED Notes (Signed)
Patient transported to X-ray 

## 2012-10-30 NOTE — ED Notes (Addendum)
Pt c/o r side chest pain x2 days. Pt also reports emesis x1 this morning.  Pt also reports falling x1 week and c/o l finger pain.

## 2012-10-31 ENCOUNTER — Encounter: Payer: Self-pay | Admitting: Endocrinology

## 2012-10-31 ENCOUNTER — Ambulatory Visit (INDEPENDENT_AMBULATORY_CARE_PROVIDER_SITE_OTHER): Payer: Medicaid Other | Admitting: Endocrinology

## 2012-10-31 VITALS — BP 122/80 | HR 103 | Temp 98.2°F | Resp 12 | Ht 66.0 in | Wt 219.0 lb

## 2012-10-31 DIAGNOSIS — E890 Postprocedural hypothyroidism: Secondary | ICD-10-CM

## 2012-10-31 DIAGNOSIS — E119 Type 2 diabetes mellitus without complications: Secondary | ICD-10-CM

## 2012-10-31 DIAGNOSIS — E876 Hypokalemia: Secondary | ICD-10-CM

## 2012-10-31 DIAGNOSIS — E7800 Pure hypercholesterolemia, unspecified: Secondary | ICD-10-CM

## 2012-10-31 DIAGNOSIS — E669 Obesity, unspecified: Secondary | ICD-10-CM

## 2012-10-31 LAB — COMPREHENSIVE METABOLIC PANEL
ALT: 10 U/L (ref 0–35)
AST: 14 U/L (ref 0–37)
Albumin: 3.4 g/dL — ABNORMAL LOW (ref 3.5–5.2)
Alkaline Phosphatase: 85 U/L (ref 39–117)
BUN: 10 mg/dL (ref 6–23)
CO2: 30 mEq/L (ref 19–32)
Calcium: 8.8 mg/dL (ref 8.4–10.5)
Chloride: 104 mEq/L (ref 96–112)
Creatinine, Ser: 0.8 mg/dL (ref 0.4–1.2)
GFR: 106.48 mL/min (ref >60.00)
Glucose, Bld: 93 mg/dL (ref 70–99)
Potassium: 3.4 mEq/L — ABNORMAL LOW (ref 3.5–5.1)
Sodium: 137 mEq/L (ref 135–145)
Total Bilirubin: 0.4 mg/dL (ref 0.3–1.2)
Total Protein: 7.1 g/dL (ref 6.0–8.3)

## 2012-10-31 LAB — LIPID PANEL
Cholesterol: 145 mg/dL (ref 0–200)
HDL: 27.6 mg/dL — ABNORMAL LOW (ref >39.00)
LDL Cholesterol: 99 mg/dL (ref 0–99)
Total CHOL/HDL Ratio: 5
Triglycerides: 90 mg/dL (ref 0.0–149.0)
VLDL: 18 mg/dL (ref 0.0–40.0)

## 2012-10-31 LAB — T4, FREE: Free T4: 1.1 ng/dL (ref 0.60–1.60)

## 2012-10-31 LAB — TSH: TSH: 1.27 u[IU]/mL (ref 0.35–5.50)

## 2012-10-31 LAB — HEMOGLOBIN A1C: Hgb A1c MFr Bld: 5.9 % (ref 4.6–6.5)

## 2012-10-31 NOTE — Patient Instructions (Signed)
Please check blood sugars at least half the time about 2 hours after any meal and as directed on waking up. Please bring blood sugar monitor to each visit  STOP PHENTEREMINE, stay on Victoza  Keep portions small
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
## 2012-10-31 NOTE — Progress Notes (Signed)
Quick Note:  Please let patient know that the Thyroid result is normal Needs K-Dur 20 meq qd for low potassium for 30 days ______

## 2012-11-02 DIAGNOSIS — E1169 Type 2 diabetes mellitus with other specified complication: Secondary | ICD-10-CM | POA: Insufficient documentation

## 2012-11-06 ENCOUNTER — Telehealth: Payer: Self-pay | Admitting: *Deleted

## 2012-11-06 ENCOUNTER — Other Ambulatory Visit: Payer: Self-pay | Admitting: *Deleted

## 2012-11-06 MED ORDER — POTASSIUM CHLORIDE CRYS ER 20 MEQ PO TBCR: EXTENDED_RELEASE_TABLET | ORAL | Status: DC

## 2012-11-06 NOTE — Telephone Encounter (Signed)
Pt is aware, rx was sent in for K-Dur 20 MEQ

## 2012-11-06 NOTE — Telephone Encounter (Signed)
Message copied by Hermenia Bers on Wed Nov 06, 2012  3:24 PM ------      Message from: Jade Mathis      Created: Thu Oct 31, 2012  7:56 PM       Please let patient know that the Thyroid result is normal      Needs K-Dur 20 meq qd for low potassium for 30 days ------

## 2012-11-08 ENCOUNTER — Encounter (HOSPITAL_COMMUNITY): Payer: Self-pay

## 2012-11-08 ENCOUNTER — Emergency Department (INDEPENDENT_AMBULATORY_CARE_PROVIDER_SITE_OTHER): Payer: Medicaid Other

## 2012-11-08 ENCOUNTER — Emergency Department (HOSPITAL_COMMUNITY)
Admission: EM | Admit: 2012-11-08 | Discharge: 2012-11-08 | Disposition: A | Discharge status: Home / Self Care | Payer: Medicaid Other | Source: Home / Self Care | Attending: Emergency Medicine | Admitting: Emergency Medicine

## 2012-11-08 DIAGNOSIS — R0789 Other chest pain: Secondary | ICD-10-CM

## 2012-11-08 DIAGNOSIS — R071 Chest pain on breathing: Secondary | ICD-10-CM

## 2012-11-08 MED ORDER — DICLOFENAC SODIUM 75 MG PO TBEC: 75.0000 mg | DELAYED_RELEASE_TABLET | Freq: Two times a day (BID) | ORAL | Status: DC

## 2012-11-08 MED ORDER — HYDROCODONE-ACETAMINOPHEN 5-325 MG PO TABS
ORAL_TABLET | ORAL | Status: AC
  Filled 2012-11-08: qty 1

## 2012-11-08 MED ORDER — ONDANSETRON 4 MG PO TBDP
ORAL_TABLET | ORAL | Status: AC
  Filled 2012-11-08: qty 1

## 2012-11-08 MED ORDER — ONDANSETRON 4 MG PO TBDP: 4.0000 mg | ORAL_TABLET | Freq: Three times a day (TID) | ORAL | Status: DC | PRN

## 2012-11-08 MED ORDER — HYDROCODONE-ACETAMINOPHEN 5-325 MG PO TABS
1.0000 | ORAL_TABLET | Freq: Once | ORAL | Status: AC
  Administered 2012-11-08: 1 via ORAL

## 2012-11-08 MED ORDER — ONDANSETRON 4 MG PO TBDP
4.0000 mg | ORAL_TABLET | Freq: Once | ORAL | Status: AC
  Administered 2012-11-08: 4 mg via ORAL

## 2012-11-08 NOTE — ED Notes (Signed)
C/o pain in her chest x 3 weeks reluctant to talk or co-operate w  [facility] staff, tender to palpation of chest
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
## 2012-11-08 NOTE — ED Provider Notes (Signed)
Medical screening examination/treatment/procedure(s) were performed by a resident physician and as supervising physician I was immediately available for consultation/collaboration.  Leslee Home, M.D.  Reuben Likes, MD 11/08/12 2221

## 2012-11-12 ENCOUNTER — Encounter (HOSPITAL_COMMUNITY): Payer: Self-pay

## 2012-11-12 ENCOUNTER — Emergency Department (HOSPITAL_COMMUNITY)
Admission: EM | Admit: 2012-11-12 | Discharge: 2012-11-13 | Disposition: A | Discharge status: Home / Self Care | Payer: Medicaid Other | Attending: Emergency Medicine | Admitting: Emergency Medicine

## 2012-11-12 DIAGNOSIS — E876 Hypokalemia: Secondary | ICD-10-CM

## 2012-11-12 DIAGNOSIS — Z862 Personal history of diseases of the blood and blood-forming organs and certain disorders involving the immune mechanism: Secondary | ICD-10-CM | POA: Insufficient documentation

## 2012-11-12 DIAGNOSIS — R45851 Suicidal ideations: Secondary | ICD-10-CM | POA: Insufficient documentation

## 2012-11-12 DIAGNOSIS — F319 Bipolar disorder, unspecified: Secondary | ICD-10-CM | POA: Insufficient documentation

## 2012-11-12 DIAGNOSIS — Z79899 Other long term (current) drug therapy: Secondary | ICD-10-CM | POA: Insufficient documentation

## 2012-11-12 DIAGNOSIS — IMO0002 Reserved for concepts with insufficient information to code with codable children: Secondary | ICD-10-CM | POA: Insufficient documentation

## 2012-11-12 DIAGNOSIS — G7000 Myasthenia gravis without (acute) exacerbation: Secondary | ICD-10-CM | POA: Insufficient documentation

## 2012-11-12 DIAGNOSIS — Z9889 Other specified postprocedural states: Secondary | ICD-10-CM | POA: Insufficient documentation

## 2012-11-12 DIAGNOSIS — Z8669 Personal history of other diseases of the nervous system and sense organs: Secondary | ICD-10-CM | POA: Insufficient documentation

## 2012-11-12 DIAGNOSIS — Z3202 Encounter for pregnancy test, result negative: Secondary | ICD-10-CM | POA: Insufficient documentation

## 2012-11-12 DIAGNOSIS — Z8619 Personal history of other infectious and parasitic diseases: Secondary | ICD-10-CM | POA: Insufficient documentation

## 2012-11-12 DIAGNOSIS — F39 Unspecified mood [affective] disorder: Secondary | ICD-10-CM | POA: Insufficient documentation

## 2012-11-12 DIAGNOSIS — E119 Type 2 diabetes mellitus without complications: Secondary | ICD-10-CM | POA: Insufficient documentation

## 2012-11-12 DIAGNOSIS — Z9149 Other personal history of psychological trauma, not elsewhere classified: Secondary | ICD-10-CM | POA: Insufficient documentation

## 2012-11-12 DIAGNOSIS — R112 Nausea with vomiting, unspecified: Secondary | ICD-10-CM | POA: Insufficient documentation

## 2012-11-12 DIAGNOSIS — IMO0001 Reserved for inherently not codable concepts without codable children: Secondary | ICD-10-CM | POA: Insufficient documentation

## 2012-11-12 DIAGNOSIS — Z8742 Personal history of other diseases of the female genital tract: Secondary | ICD-10-CM | POA: Insufficient documentation

## 2012-11-12 DIAGNOSIS — Z8639 Personal history of other endocrine, nutritional and metabolic disease: Secondary | ICD-10-CM | POA: Insufficient documentation

## 2012-11-12 DIAGNOSIS — J45909 Unspecified asthma, uncomplicated: Secondary | ICD-10-CM | POA: Insufficient documentation

## 2012-11-12 DIAGNOSIS — R443 Hallucinations, unspecified: Secondary | ICD-10-CM | POA: Insufficient documentation

## 2012-11-12 DIAGNOSIS — R42 Dizziness and giddiness: Secondary | ICD-10-CM | POA: Insufficient documentation

## 2012-11-12 DIAGNOSIS — I10 Essential (primary) hypertension: Secondary | ICD-10-CM | POA: Insufficient documentation

## 2012-11-12 LAB — CBC
HCT: 38.2 % (ref 36.0–46.0)
Hemoglobin: 12 g/dL (ref 12.0–15.0)
MCH: 25.5 pg — ABNORMAL LOW (ref 26.0–34.0)
MCHC: 31.4 g/dL (ref 30.0–36.0)
MCV: 81.1 fL (ref 78.0–100.0)
Platelets: 282 10*3/uL (ref 150–400)
RBC: 4.71 MIL/uL (ref 3.87–5.11)
RDW: 14.3 % (ref 11.5–15.5)
WBC: 7.3 10*3/uL (ref 4.0–10.5)

## 2012-11-12 LAB — COMPREHENSIVE METABOLIC PANEL
ALT: 10 U/L (ref 0–35)
AST: 13 U/L (ref 0–37)
Albumin: 3.3 g/dL — ABNORMAL LOW (ref 3.5–5.2)
Alkaline Phosphatase: 102 U/L (ref 39–117)
BUN: 10 mg/dL (ref 6–23)
CO2: 29 mEq/L (ref 19–32)
Calcium: 9.2 mg/dL (ref 8.4–10.5)
Chloride: 100 mEq/L (ref 96–112)
Creatinine, Ser: 0.71 mg/dL (ref 0.50–1.10)
GFR calc Af Amer: >90 mL/min (ref >90)
GFR calc non Af Amer: >90 mL/min (ref >90)
Glucose, Bld: 84 mg/dL (ref 70–99)
Potassium: 3.1 mEq/L — ABNORMAL LOW (ref 3.5–5.1)
Sodium: 137 mEq/L (ref 135–145)
Total Bilirubin: 0.3 mg/dL (ref 0.3–1.2)
Total Protein: 7.4 g/dL (ref 6.0–8.3)

## 2012-11-12 LAB — RAPID URINE DRUG SCREEN, HOSP PERFORMED
Amphetamines: NOT DETECTED
Barbiturates: NOT DETECTED
Benzodiazepines: NOT DETECTED
Cocaine: NOT DETECTED
Opiates: NOT DETECTED
Tetrahydrocannabinol: NOT DETECTED

## 2012-11-12 LAB — ETHANOL: Alcohol, Ethyl (B): <11 mg/dL (ref 0–11)

## 2012-11-12 LAB — POCT PREGNANCY, URINE: Preg Test, Ur: NEGATIVE

## 2012-11-12 MED ORDER — PYRIDOSTIGMINE BROMIDE 60 MG PO TABS
60.0000 mg | ORAL_TABLET | Freq: Four times a day (QID) | ORAL | Status: DC
  Administered 2012-11-12 – 2012-11-13 (×2): 60 mg via ORAL
  Filled 2012-11-12 (×5): qty 1

## 2012-11-12 MED ORDER — PREDNISONE 20 MG PO TABS
10.0000 mg | ORAL_TABLET | Freq: Every day | ORAL | Status: DC
  Administered 2012-11-12 – 2012-11-13 (×2): 10 mg via ORAL
  Filled 2012-11-12 (×2): qty 1

## 2012-11-12 MED ORDER — PHENTERMINE HCL 30 MG PO CAPS: 30.0000 mg | ORAL_CAPSULE | Freq: Every day | ORAL | Status: DC

## 2012-11-12 MED ORDER — LISINOPRIL 10 MG PO TABS
10.0000 mg | ORAL_TABLET | Freq: Every day | ORAL | Status: DC
  Administered 2012-11-13: 10 mg via ORAL
  Filled 2012-11-12: qty 1

## 2012-11-12 MED ORDER — DICLOFENAC SODIUM 75 MG PO TBEC
75.0000 mg | DELAYED_RELEASE_TABLET | Freq: Two times a day (BID) | ORAL | Status: DC
  Administered 2012-11-12 – 2012-11-13 (×2): 75 mg via ORAL
  Filled 2012-11-12 (×3): qty 1

## 2012-11-12 MED ORDER — METFORMIN HCL 500 MG PO TABS
1000.0000 mg | ORAL_TABLET | Freq: Two times a day (BID) | ORAL | Status: DC
  Administered 2012-11-13: 1000 mg via ORAL
  Filled 2012-11-12 (×3): qty 2

## 2012-11-12 MED ORDER — LEVOTHYROXINE SODIUM 175 MCG PO TABS
175.0000 ug | ORAL_TABLET | Freq: Every day | ORAL | Status: DC
  Administered 2012-11-13: 175 ug via ORAL
  Filled 2012-11-12 (×2): qty 1

## 2012-11-12 MED ORDER — AZATHIOPRINE 50 MG PO TABS
100.0000 mg | ORAL_TABLET | Freq: Every day | ORAL | Status: DC
  Administered 2012-11-13: 100 mg via ORAL
  Filled 2012-11-12: qty 2

## 2012-11-12 MED ORDER — POTASSIUM CHLORIDE CRYS ER 20 MEQ PO TBCR
40.0000 meq | EXTENDED_RELEASE_TABLET | Freq: Once | ORAL | Status: AC
  Administered 2012-11-12: 40 meq via ORAL
  Filled 2012-11-12: qty 2

## 2012-11-12 MED ORDER — GLIMEPIRIDE 1 MG PO TABS
1.0000 mg | ORAL_TABLET | Freq: Every day | ORAL | Status: DC
  Administered 2012-11-13: 1 mg via ORAL
  Filled 2012-11-12 (×2): qty 1

## 2012-11-12 MED ORDER — ONDANSETRON 4 MG PO TBDP: 4.0000 mg | ORAL_TABLET | Freq: Three times a day (TID) | ORAL | Status: DC | PRN

## 2012-11-12 MED ORDER — ARIPIPRAZOLE 10 MG PO TABS
10.0000 mg | ORAL_TABLET | Freq: Every day | ORAL | Status: DC
  Administered 2012-11-13: 10 mg via ORAL
  Filled 2012-11-12: qty 1

## 2012-11-12 MED ORDER — AMPHETAMINE-DEXTROAMPHETAMINE 20 MG PO TABS
60.0000 mg | ORAL_TABLET | Freq: Every day | ORAL | Status: DC
  Administered 2012-11-13: 60 mg via ORAL
  Filled 2012-11-12: qty 3

## 2012-11-12 MED ORDER — ATORVASTATIN CALCIUM 10 MG PO TABS
10.0000 mg | ORAL_TABLET | Freq: Every day | ORAL | Status: DC
  Administered 2012-11-13: 10 mg via ORAL
  Filled 2012-11-12 (×2): qty 1

## 2012-11-12 MED ORDER — PREGABALIN 50 MG PO CAPS
50.0000 mg | ORAL_CAPSULE | Freq: Three times a day (TID) | ORAL | Status: DC
  Filled 2012-11-12 (×2): qty 1

## 2012-11-12 MED ORDER — FAMOTIDINE 20 MG PO TABS
20.0000 mg | ORAL_TABLET | Freq: Every day | ORAL | Status: DC
  Administered 2012-11-12 – 2012-11-13 (×2): 20 mg via ORAL
  Filled 2012-11-12 (×2): qty 1

## 2012-11-12 MED ORDER — ALBUTEROL SULFATE (5 MG/ML) 0.5% IN NEBU: 2.5000 mg | INHALATION_SOLUTION | RESPIRATORY_TRACT | Status: DC | PRN

## 2012-11-12 NOTE — ED Notes (Signed)
Per EMS pt c/o severe depression; states pt called her PCP and cancelled all her appt. States won't be here, they called 911, states she sound lethargic over the phone, pt denies SA, just depressed, states want to go with her dad, her dad passed away 79yr ago.

## 2012-11-12 NOTE — ED Provider Notes (Addendum)
Jade Mathis is a 37 y.o. female who was brought to the emergency department today because his stated suicidal ideation. She is evasive when asked why she is here, who brought her here or what the preceding problems were. She has apparently reported to someone that she was going to jump in front of her car. She endorses being upset over her father's death, 1 year ago. She states that she takes medications prescribed by a psychiatrist and medications prescribed by her primary care doctor. She denies recent illness. She states that she wants to leave the emergency department immediately.  Exam alert, obese, in mild distress. She is evasive, when responding to questions. She appears depressed. Neurologic exam is grossly nonfocal.   Assessment: suicide ideation with high elopement risk.  I filled out involuntary commitment papers on the patient. She may require sedation and restraint. She will have to be medically cleared    Flint Melter, MD 11/12/12 1824  Patient Vitals for the past 24 hrs:  BP Temp Temp src Pulse Resp SpO2  11/12/12 1706 117/73 mmHg 98.7 F (37.1 C) Oral 74 20 100 %    Medical clearance, done. She is medically cleared for psychiatric treatment. She has an incidentally low potassium without clear cause. It may be related to sporadic vomiting, and diarrhea. She's treated with potassium orally, x1. Will repeat potassium level in the a.m.  Psychiatry services has been consulted, and we are waiting for their evaluation.   Medical screening examination/treatment/procedure(s) were conducted as a shared visit with non-physician practitioner(s) and myself.  I personally evaluated the patient during the encounter  Flint Melter, MD 11/12/12 2308

## 2012-11-12 NOTE — ED Notes (Signed)
Pt has 2 labeled belonging bags placed in locker #26 in the TCU area.  She has a labeled cane at the nurse station next to hall bed B.

## 2012-11-12 NOTE — ED Notes (Signed)
Pt states she doesn't want the person who attempted to draw labs to be the one to do it, that she doesn't like her.  She also won't speak to the pharm tech at all.
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
## 2012-11-12 NOTE — ED Notes (Signed)
Bed: PP29 Expected date:  Expected time:  Means of arrival:  Comments: Cleaning

## 2012-11-13 ENCOUNTER — Encounter (HOSPITAL_COMMUNITY): Payer: Self-pay | Admitting: Registered Nurse

## 2012-11-13 DIAGNOSIS — F329 Major depressive disorder, single episode, unspecified: Secondary | ICD-10-CM

## 2012-11-13 LAB — GLUCOSE, CAPILLARY: Glucose-Capillary: 83 mg/dL (ref 70–99)

## 2012-11-13 NOTE — ED Provider Notes (Signed)
Pt seen by psych. Cleared for discharged. Outpt FU.   Raeford Razor, MD 11/13/12 1409

## 2012-11-13 NOTE — ED Provider Notes (Signed)
Medical screening examination/treatment/procedure(s) were conducted as a shared visit with non-physician practitioner(s) and myself.  I personally evaluated the patient during the encounter  See  Associated note  Flint Melter, MD 11/13/12 [phone]
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
## 2012-11-13 NOTE — Progress Notes (Signed)
Per discussion with psychiatrist and np patient psychiatrically stable for discharge home. CSW provided form to EDP to rescind IVC.   Marland KitchenFrutoso Schatz [phone]  ED CSW 11/13/2012 1233pm

## 2012-11-19 ENCOUNTER — Ambulatory Visit (INDEPENDENT_AMBULATORY_CARE_PROVIDER_SITE_OTHER): Payer: Medicaid Other | Admitting: Neurology

## 2012-11-19 ENCOUNTER — Encounter: Payer: Self-pay | Admitting: Neurology

## 2012-11-19 ENCOUNTER — Other Ambulatory Visit: Payer: Medicaid Other

## 2012-11-19 VITALS — BP 118/86 | HR 105 | Temp 98.3°F | Resp 16 | Ht 66.0 in | Wt 219.0 lb

## 2012-11-19 DIAGNOSIS — G7000 Myasthenia gravis without (acute) exacerbation: Secondary | ICD-10-CM

## 2012-11-19 MED ORDER — PREDNISONE 10 MG PO TABS: 10.0000 mg | ORAL_TABLET | Freq: Every day | ORAL | Status: DC

## 2012-11-19 MED ORDER — AZATHIOPRINE 100 MG PO TABS: 100.0000 mg | ORAL_TABLET | Freq: Every day | ORAL | Status: DC

## 2012-11-19 MED ORDER — PYRIDOSTIGMINE BROMIDE 60 MG PO TABS: 60.0000 mg | ORAL_TABLET | Freq: Four times a day (QID) | ORAL | Status: DC

## 2012-11-19 NOTE — Patient Instructions (Addendum)
1.  Continue prednisone 10mg  daily and Imuran 100mg  daily 2.  Take mestinon 60mg  as follows:  One-half tablet at 7:30am, one tablet at 12:30pm, one tablet at 4:30pm, and one tablet at 8:30pm. 3.  Follow-up with psychiatrist (Dr. Thedore Mins) at [facility] for Bipolar disorder and depression. 4.  Follow-up [facility] regarding TB treatment [facility] [phone]). 5.  Return to clinic in 4-month.
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
## 2012-11-22 ENCOUNTER — Ambulatory Visit: Payer: Medicaid Other | Admitting: Endocrinology

## 2012-12-03 ENCOUNTER — Other Ambulatory Visit: Payer: Self-pay | Admitting: *Deleted

## 2012-12-03 MED ORDER — GLUCOSE BLOOD VI STRP: ORAL_STRIP | Status: DC

## 2012-12-19 ENCOUNTER — Ambulatory Visit (INDEPENDENT_AMBULATORY_CARE_PROVIDER_SITE_OTHER): Payer: Medicaid Other | Admitting: Neurology

## 2012-12-19 ENCOUNTER — Other Ambulatory Visit: Payer: Self-pay | Admitting: *Deleted

## 2012-12-19 ENCOUNTER — Encounter: Payer: Self-pay | Admitting: Neurology

## 2012-12-19 VITALS — BP 118/76 | HR 84 | Temp 98.2°F | Resp 20 | Ht 66.0 in | Wt 211.0 lb

## 2012-12-19 DIAGNOSIS — G7000 Myasthenia gravis without (acute) exacerbation: Secondary | ICD-10-CM

## 2012-12-19 LAB — COMPREHENSIVE METABOLIC PANEL
ALT: 14 U/L (ref 0–35)
AST: 19 U/L (ref 0–37)
Albumin: 3.7 g/dL (ref 3.5–5.2)
Alkaline Phosphatase: 85 U/L (ref 39–117)
BUN: 13 mg/dL (ref 6–23)
CO2: 31 mEq/L (ref 19–32)
Calcium: 8.9 mg/dL (ref 8.4–10.5)
Chloride: 101 mEq/L (ref 96–112)
Creatinine, Ser: 0.7 mg/dL (ref 0.4–1.2)
GFR: 129.02 mL/min (ref >60.00)
Glucose, Bld: 106 mg/dL — ABNORMAL HIGH (ref 70–99)
Potassium: 3.7 mEq/L (ref 3.5–5.1)
Sodium: 135 mEq/L (ref 135–145)
Total Bilirubin: 0.4 mg/dL (ref 0.3–1.2)
Total Protein: 7.4 g/dL (ref 6.0–8.3)

## 2012-12-19 LAB — CBC WITH DIFFERENTIAL/PLATELET
Basophils Absolute: 0 10*3/uL (ref 0.0–0.1)
Basophils Relative: 0.3 % (ref 0.0–3.0)
Eosinophils Absolute: 0.2 10*3/uL (ref 0.0–0.7)
Eosinophils Relative: 2.3 % (ref 0.0–5.0)
HCT: 37.1 % (ref 36.0–46.0)
Hemoglobin: 12.1 g/dL (ref 12.0–15.0)
Lymphocytes Relative: 31.4 % (ref 12.0–46.0)
Lymphs Abs: 2.1 10*3/uL (ref 0.7–4.0)
MCHC: 32.7 g/dL (ref 30.0–36.0)
MCV: 79.5 fl (ref 78.0–100.0)
Monocytes Absolute: 0.4 10*3/uL (ref 0.1–1.0)
Monocytes Relative: 6.1 % (ref 3.0–12.0)
Neutro Abs: 4 10*3/uL (ref 1.4–7.7)
Neutrophils Relative %: 59.9 % (ref 43.0–77.0)
Platelets: 275 10*3/uL (ref 150.0–400.0)
RBC: 4.67 Mil/uL (ref 3.87–5.11)
RDW: 14.7 % — ABNORMAL HIGH (ref 11.5–14.6)
WBC: 6.7 10*3/uL (ref 4.5–10.5)

## 2012-12-19 MED ORDER — PHENTERMINE HCL 30 MG PO CAPS: ORAL_CAPSULE | ORAL | Status: DC

## 2012-12-19 MED ORDER — GLIMEPIRIDE 1 MG PO TABS: 1.0000 mg | ORAL_TABLET | Freq: Every day | ORAL | Status: DC

## 2012-12-19 NOTE — Patient Instructions (Addendum)
1.  Take one tablet of mestinon 60mg  at 8AM, 1PM, and 7PM 2.  Continue Imuran 100mg  daily daily and prednisone 10mg  daily 3.  Check blood work today 4.  Patient to go home and call us back with dose of Imuran  5.  Make note of any worsening or improving symptoms (weakness, diarrhea)  6.  Please call my office in one week for a clinical update 7.  Return to clinic in 55-month

## 2012-12-19 NOTE — Progress Notes (Signed)
[facility] Neurology Division  Follow-up Visit   Date: 12/19/2012   Jade Mathis MRN: [id number] DOB: [birth year]   Interim History: Jade Mathis is a 37 y.o. 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 Patient is accompanied by self.  She was last seen in the clinic on November 19, 2012.  At her last visit, I recommended reducing her mestinon to TID dosing, because she was complaining of diarrhea and cramping.  She did reduce the morning dose to half a pill, but has not noticed any significant changes.  Denies any double vision, problems with swallowing or talking, or new weakness. She has generalized fatigue which is worse at the end of the day.  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    There was also a huge overlay of depression which started after her father's death in 01-Aug-2011 when he died within a month of being diagnosed with lung cancer diagnosis. She sees Dr. Jannifer Franklin at [facility] who has adjusted her bipolar medications and has behavorial therapist that is coming to her home.  She has not noticed any changes in her depression yet, but says these were just started and is unsure if this is contributing to her diarrhea.   Functionally, she is unable to care for herself and has a care giver since 07/31/2008 who comes daily for 6 hours and helps her with all her ADLs.  She has been walking with a cane for the past year.  Mood is depressed. Appetite is poor and she has lost 8 pounds since her last visit. She has no hobbies. Denies any suicidal or homicidal ideation.   Current MG medications include:  1. Prednisone 10mg   daily. 2. Imuran 100mg  daily (she reports taking this twice a day) 3. Mestinon 60mg  QID (one-half tablet at 730AM, then one tab at 12:30PM, 4:30PM, 8:30PM).  Of note, she has a complex headache history and sees Dr. Neale Burly for chronic migraines. She also has a history of narcotic use for headaches as well as getting prescriptions from multiple providers. She complains of generalized pain today and attributes this to fibromyalgia.   History of present illness: She was diagnosed with myasthenia gravis in 08-01-95 at which time symptoms were manifested with systemic weakness, double vision, ptosis, and problems with swallowing and speaking. Diagnosis was made based on repetitive nerve stimulation and blood tests, however we do not have these records. 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 Around the same time, prednisone and mestinon was initiated. At some point, she was started on azathioprine. Imuran was stopped in 08/01/2002 because of an unplanned pregnancy and she remained on prednisone 20mg  until Jul 31, 2009 when she had an exacerbation requiring out-patient PLEX. In Jul 31, 2009, Imuran was restarted (dose unknown). She been on prednisone since 08/01/1995 and the lowest dose is her current dose at 10mg  daily. Cellcept was tried but she developed a rash so it was stopped.   The patient has previously been evaluated at [facility] by Dr. Nedra Hai Aug 01, 2010), discharged from [facility] due to concern of malingering, and then established care with Dr. Modesto Charon at [facility] in April 2013. She was last evaluated by  Dr. Modesto Charon on 11/23/2011. Most recently (12/2011 - 07/2012), she was evaluated by Dr. Anne Hahn at [facility] in [facility].   When she initially saw Dr. Modesto Charon in April 2013, she was only on prednisone 10mg  and mestinon 60mg  QID and demonstrated clinical  signs of weakness, therefore Imuran 50mg  daily was restarted with plans to titrate to MCV >100. At her follow-up visit in August, she had mentioned trying to get pregnant so Imuran was stopped and prednisone was increased to 30mg  daily. She tells me that she was not trying to get pregnant but did not have an IUD at that time. At her subsequent visit in September 2013, she apparently changed her mind and was no longer planning on pregnancy so was continued on Imuran 100mg  daily, prednisone 10mg , and mestinon 60mg  QID.     Medications:  Current Outpatient Prescriptions on File Prior to Visit  Medication Sig Dispense Refill  . albuterol (PROVENTIL) (2.5 MG/3ML) 0.083% nebulizer solution Take 2.5 mg by nebulization every 4 (four) hours as needed. Inhale 3 ml every 4 hours for dyspnea. Give supply for 60 doses       . amphetamine-dextroamphetamine (ADDERALL) 30 MG tablet Take 60 mg by mouth daily.      Marland Kitchen atorvastatin (LIPITOR) 10 MG tablet Take 10 mg by mouth daily with breakfast.      . azathioprine (IMURAN) 100 MG tablet Take 1 tablet (100 mg total) by mouth daily.  60 tablet  5  . Glimepiride (AMARYL PO) Take 1 mg by mouth daily.       Marland Kitchen glucose blood (ACCU-CHEK AVIVA PLUS) test strip Use as instructed to check blood sugars 3 times per day, dx code 250.00  100 each  12  . levothyroxine (SYNTHROID, LEVOTHROID) 175 MCG tablet Take 175 mcg by mouth daily.      Marland Kitchen lisinopril (PRINIVIL,ZESTRIL) 10 MG tablet Take 10 mg by mouth daily.      . metFORMIN (GLUCOPHAGE) 1000 MG tablet Take 1,000 mg by mouth 2 (two) times daily.      Marland Kitchen oxyCODONE-acetaminophen (PERCOCET) 10-325 MG per tablet Take 1 tablet by mouth See admin instructions. Take one tablet 5 times daily as needed for pain      . phentermine 30 MG capsule TAKE ONE CAPSULE BY MOUTH ONE TIME DAILY  30 capsule  2  . potassium chloride SA (K-DUR,KLOR-CON) 20 MEQ tablet Take one tablet daily for 30 days  30 tablet  0  . predniSONE (DELTASONE) 10 MG tablet  Take 1 tablet (10 mg total) by mouth daily.  30 tablet  5  . valACYclovir (VALTREX) 500 MG tablet Take 500 mg by mouth 2 (two) times daily as needed. For outbreak        No current facility-administered medications on file prior to visit.    Allergies:  Allergies  Allergen Reactions  . Depo-Provera [Medroxyprogesterone] Other (See Comments)    Bad headaches.  . Vicodin [Hydrocodone-Acetaminophen] Nausea Only     Review of Systems:  CONSTITUTIONAL: No fevers, chills, night sweats, + weight loss.   EYES: No visual changes or eye pain ENT: No hearing changes.  No history of nose bleeds.   RESPIRATORY: No cough, wheezing + shortness of breath.   CARDIOVASCULAR: Negative for chest pain, and palpitations.   GI: Negative for abdominal discomfort, blood in stools or black stools.  + diarrhea    GU:  No history of incontinence.   MUSCLOSKELETAL: + joint pain or swelling.  No myalgias.  SKIN: Negative for lesions, rash, and itching.   HEMATOLOGY/ONCOLOGY: Negative for prolonged bleeding, bruising easily, and swollen nodes.  No history of cancer.   ENDOCRINE: Negative for cold or heat intolerance, polydipsia or goiter.   PSYCH:  + depression or anxiety symptoms.   NEURO: As Above.   Vital Signs:  BP 118/76  Pulse 84  Temp(Src) 98.2 F (36.8 C)  Resp 20  Ht 5\' 6"  (1.676 m)  Wt 211 lb (95.709 kg)  BMI 34.07 kg/m2    Neurological Exam: Severely blunted affect, very poor eye contact, tearful whenever she talks about her father.  MENTAL STATUS including orientation to time, place, person, recent and remote memory, attention span and concentration, language, and fund of knowledge is fair.  Speech is not dysarthric.  CRANIAL NERVES: II:  No visual field defects.    III-IV-VI: Pupils equal round and reactive to light.  Normal conjugate, extra-ocular eye movements in all directions of gaze.  No nystagmus.  Mild bilateral ptosis at baseline and inconsistent with sustained upgaze.  V:   Normal facial sensation.    VII:  Normal facial symmetry and movements.  Normal strength of frontalis, orbicularis oculi, and orbicularis oris muscles. She has mild weakness in maintain air in her cheeks against resistance.  VIII: Normal hearing and vestibular function.  IX-X: Normal palatal movement.  XI: Normal shoulder shrug and head rotation.  XII: Normal tongue strength and range of motion, no deviation or fasciculation. Tongue movement is slightly slowed.  MOTOR:  No atrophy, fasciculations or abnormal movements.  No pronator drift.  Tone is normal.   Neck flexion 5/5 Neck extension 5/5  Right Upper Extremity:    Left Upper Extremity:    Deltoid  5/5   Deltoid  5/5   Biceps  5/5   Biceps  5/5   Triceps  5/5   Triceps  5/5   Wrist extensors  5/5   Wrist extensors  5/5   Wrist flexors  5/5   Wrist flexors  5/5   Finger extensors  5/5   Finger extensors  5/5   Finger flexors  5/5   Finger flexors  5/5   Dorsal interossei  5/5   Dorsal interossei  5/5   Abductor pollicis  5/5   Abductor pollicis  5/5   Tone (Ashworth scale)  0  Tone (Ashworth scale)  0   Right Lower Extremity:    Left Lower Extremity:    Hip flexors  5/5   Hip flexors  5/5   Hip extensors  5/5   Hip extensors  5/5   Knee flexors  5/5   Knee flexors  5/5   Knee extensors  5/5   Knee extensors  5/5   Dorsiflexors  5/5   Dorsiflexors  5/5   Plantarflexors  5/5   Plantarflexors  5/5   Toe extensors  5/5   Toe extensors  5/5   Toe flexors  5/5   Toe flexors  5/5   Tone (Ashworth scale)  0  Tone (Ashworth scale)  0   MSRs:  Right                                                                 Left brachioradialis tr  brachioradialis tr  biceps tr  biceps tr  triceps tr  triceps tr  patellar tr  patellar tr  ankle jerk 0  ankle jerk 0  Hoffman no  Hoffman no  plantar response down  plantar response down   SENSORY:  Intact  COORDINATION/GAIT: Normal finger-to- nose-finger.  Slowed movements throughout  including finger tapping and heel tapping.  Unable to rise from a chair without using arms.  Externally rotated right leg, gait tested with cane and appeas slightly wide-based, but appears stable.      Active Problems: 1.  Seropositive MG (diagnosed 1997) s/p thymectomy, currently stable, managed on prednisone 10mg , Imuran 100mg  daily, and mestinon 60mg  QID 2.  Bipolar disorder with depression, seeing Dr. Jannifer Franklin at [facility] 3.  Positive PPD, on INH, discussed the importance of completing the treatment since being on immunomodulatory therapy can make her susceptible to infections.  4.  Steroid-induced diabetes 5.  Chronic migraines 6.  Fibromyalgia   IMPRESSION: Ms. Stepahnie Campo is a 37 year old female with seropositive myasthenia gravis currently on prednisone 10 mg daily, Imuran 100 mg daily(although patient reports taking it twice daily), and Mestinon 60 mg 4 times a day.  She reports having generalized weakness, but despite reducing the morning mestinon, there has been no worsening of weakness or improvement in diarrhea.   Clinically, she remains unchanged from her last office visit and her neurological exam discloses full strength with encouragement and minimal oculobulbar weakness.  It is reasonable to taper her mestinon further to one tablet TID and see if diarrhea improves.  Going forward, I also discussed slowly tapering her prednisone if she remains stable but this would be a long-term goal.  There continues to be a significant overlay of depression and mood disorder which I suspect is contributing to her generalized deconditioning and lack of motivation.  Nevertheless, it difficult to delineate whether her symptomology is from myasthenia vs depression and, again, I have discussed that it is essential for her to optimize her psychiatric health as I continue to better manage her myasthenia. She has been seeing psychiatry and a Veterinary surgeon.  Regarding her diarrhea, I  have  also told the patient to raise this concern with her psychiatrist since any of her new medications may be contributing to this.     PLAN/RECOMMENDATIONS:  1.  Reduce mestinon to one tablet of mestinon 60mg  at 8AM, 1PM, and 7PM. She has been instructed to make note of any worsening or improving symptoms (weakness, diarrhea)  2.  Continue Imuran 100mg  daily daily and prednisone 10mg  daily 3.  Check CBC and CMP today, follow every 30-months 4.  Patient to go home and call us back with dose of Imuran since she reports taking it twice daily 5.  Please call my office in one week for a clinical update and and further taper of mestinon can be decided at that time 6.  Return to clinic in 68-month  The duration of this appointment visit was 40 minutes of face-to-face time with the patient.  Greater than 50% of this time was spent in counseling, explanation of diagnosis, planning of further management, and coordination of care.   Thank you for allowing me to participate in patient's care.  If I can answer any additional questions, I would be pleased to do so.    Sincerely,    Donika K. Allena Katz, DO

## 2012-12-26 ENCOUNTER — Telehealth: Payer: Self-pay | Admitting: *Deleted

## 2012-12-26 NOTE — Telephone Encounter (Signed)
It is not as good as Victoza that she is taking. Need to include Victoza on her medication list

## 2012-12-26 NOTE — Telephone Encounter (Signed)
Patient says she has heard about Kombiglyze 2.5/1000mg , that it helps with blood sugar and can help lose weight, she wants to know if you would prescribe this for her?

## 2012-12-27 ENCOUNTER — Other Ambulatory Visit: Payer: Self-pay | Admitting: *Deleted

## 2012-12-27 NOTE — Telephone Encounter (Signed)
The Kombiglyze will not help as much with weight loss. Would prefer to have her continue metformin and start Invokana 100 mg daily which will help with weight loss. We can start this now or discuss that on her next visit when I can explain what it is

## 2012-12-27 NOTE — Telephone Encounter (Signed)
She said with victoza her blood sugars are running, she also said she forgets to take the victoza and she just wants to take the pill because it has everything in it.  Please advise, CB # [phone]

## 2012-12-27 NOTE — Telephone Encounter (Signed)
Left message on pt.'s vm.

## 2013-01-09 ENCOUNTER — Telehealth: Payer: Self-pay | Admitting: *Deleted

## 2013-01-09 NOTE — Telephone Encounter (Signed)
"  Have my toenails come back with the fungus?"  Requested [facility] [phone] to fax finaal results.  I left message [phone] that I would present final results to Dr Charlsie Merles on 01/13/2013 and call again with instructions.

## 2013-01-10 ENCOUNTER — Other Ambulatory Visit: Payer: Self-pay | Admitting: *Deleted

## 2013-01-10 MED ORDER — LIRAGLUTIDE 18 MG/3ML ~~LOC~~ SOPN: 1.8000 mg | PEN_INJECTOR | Freq: Every day | SUBCUTANEOUS | Status: DC

## 2013-01-13 ENCOUNTER — Emergency Department (HOSPITAL_COMMUNITY)
Admission: EM | Admit: 2013-01-13 | Discharge: 2013-01-13 | Disposition: A | Discharge status: Home / Self Care | Payer: Medicaid Other | Attending: Emergency Medicine | Admitting: Emergency Medicine

## 2013-01-13 ENCOUNTER — Encounter (HOSPITAL_COMMUNITY): Payer: Self-pay | Admitting: Emergency Medicine

## 2013-01-13 DIAGNOSIS — Z8742 Personal history of other diseases of the female genital tract: Secondary | ICD-10-CM | POA: Insufficient documentation

## 2013-01-13 DIAGNOSIS — J45909 Unspecified asthma, uncomplicated: Secondary | ICD-10-CM | POA: Insufficient documentation

## 2013-01-13 DIAGNOSIS — Y9389 Activity, other specified: Secondary | ICD-10-CM | POA: Insufficient documentation

## 2013-01-13 DIAGNOSIS — S40021A Contusion of right upper arm, initial encounter: Secondary | ICD-10-CM

## 2013-01-13 DIAGNOSIS — IMO0002 Reserved for concepts with insufficient information to code with codable children: Secondary | ICD-10-CM | POA: Insufficient documentation

## 2013-01-13 DIAGNOSIS — G7000 Myasthenia gravis without (acute) exacerbation: Secondary | ICD-10-CM | POA: Insufficient documentation

## 2013-01-13 DIAGNOSIS — I10 Essential (primary) hypertension: Secondary | ICD-10-CM | POA: Insufficient documentation

## 2013-01-13 DIAGNOSIS — S40029A Contusion of unspecified upper arm, initial encounter: Secondary | ICD-10-CM | POA: Insufficient documentation

## 2013-01-13 DIAGNOSIS — Z8739 Personal history of other diseases of the musculoskeletal system and connective tissue: Secondary | ICD-10-CM | POA: Insufficient documentation

## 2013-01-13 DIAGNOSIS — Z8619 Personal history of other infectious and parasitic diseases: Secondary | ICD-10-CM | POA: Insufficient documentation

## 2013-01-13 DIAGNOSIS — E119 Type 2 diabetes mellitus without complications: Secondary | ICD-10-CM | POA: Insufficient documentation

## 2013-01-13 DIAGNOSIS — Z79899 Other long term (current) drug therapy: Secondary | ICD-10-CM | POA: Insufficient documentation

## 2013-01-13 DIAGNOSIS — Y9289 Other specified places as the place of occurrence of the external cause: Secondary | ICD-10-CM | POA: Insufficient documentation

## 2013-01-13 DIAGNOSIS — S39012A Strain of muscle, fascia and tendon of lower back, initial encounter: Secondary | ICD-10-CM

## 2013-01-13 DIAGNOSIS — F319 Bipolar disorder, unspecified: Secondary | ICD-10-CM | POA: Insufficient documentation

## 2013-01-13 MED ORDER — IBUPROFEN 200 MG PO TABS
600.0000 mg | ORAL_TABLET | Freq: Once | ORAL | Status: AC
  Administered 2013-01-13: 600 mg via ORAL
  Filled 2013-01-13: qty 3

## 2013-01-13 MED ORDER — OXYCODONE-ACETAMINOPHEN 5-325 MG PO TABS
2.0000 | ORAL_TABLET | Freq: Once | ORAL | Status: AC
  Administered 2013-01-13: 2 via ORAL
  Filled 2013-01-13: qty 2

## 2013-01-13 MED ORDER — OXYCODONE-ACETAMINOPHEN 5-325 MG PO TABS: 1.0000 | ORAL_TABLET | ORAL | Status: DC | PRN

## 2013-01-13 NOTE — ED Notes (Signed)
Pt reports that she has taken percocet before with no reaction.  Requesting immediate discharge after medication administration.
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
## 2013-01-15 ENCOUNTER — Other Ambulatory Visit: Payer: Self-pay | Admitting: *Deleted

## 2013-01-15 ENCOUNTER — Telehealth: Payer: Self-pay | Admitting: Endocrinology

## 2013-01-15 NOTE — Telephone Encounter (Signed)
Left message to return call 

## 2013-01-16 ENCOUNTER — Other Ambulatory Visit: Payer: Self-pay | Admitting: *Deleted

## 2013-01-16 MED ORDER — CANAGLIFLOZIN 100 MG PO TABS: 100.0000 mg | ORAL_TABLET | Freq: Every day | ORAL | Status: DC

## 2013-01-16 NOTE — Telephone Encounter (Signed)
Patient was requesting an rx for Invokana, rx has been sent

## 2013-01-17 ENCOUNTER — Other Ambulatory Visit: Payer: Medicaid Other
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
## 2013-01-19 NOTE — Telephone Encounter (Signed)
Go over results Monday with me with her chart to decide if she should be seen

## 2013-01-20 ENCOUNTER — Other Ambulatory Visit: Payer: Self-pay | Admitting: Endocrinology

## 2013-01-20 ENCOUNTER — Other Ambulatory Visit: Payer: Self-pay

## 2013-01-20 LAB — COMPREHENSIVE METABOLIC PANEL
ALT: 11 U/L (ref 0–35)
AST: 14 U/L (ref 0–37)
Albumin: 3.8 g/dL (ref 3.5–5.2)
Alkaline Phosphatase: 94 U/L (ref 39–117)
BUN: 11 mg/dL (ref 6–23)
CO2: 29 mEq/L (ref 19–32)
Calcium: 9.3 mg/dL (ref 8.4–10.5)
Chloride: 101 mEq/L (ref 96–112)
Creat: 0.74 mg/dL (ref 0.50–1.10)
Glucose, Bld: 81 mg/dL (ref 70–99)
Potassium: 4.2 mEq/L (ref 3.5–5.3)
Sodium: 140 mEq/L (ref 135–145)
Total Bilirubin: 0.3 mg/dL (ref 0.3–1.2)
Total Protein: 6.9 g/dL (ref 6.0–8.3)

## 2013-01-20 LAB — T4, FREE: Free T4: 1.18 ng/dL (ref 0.80–1.80)

## 2013-01-20 LAB — HEMOGLOBIN A1C
Hgb A1c MFr Bld: 5.5 %
Mean Plasma Glucose: 111 mg/dL

## 2013-01-20 LAB — TSH: TSH: 1.575 u[IU]/mL (ref 0.350–4.500)

## 2013-01-21 ENCOUNTER — Ambulatory Visit (INDEPENDENT_AMBULATORY_CARE_PROVIDER_SITE_OTHER): Payer: Medicaid Other | Admitting: Endocrinology

## 2013-01-21 ENCOUNTER — Encounter: Payer: Self-pay | Admitting: Endocrinology

## 2013-01-21 VITALS — BP 158/92 | HR 89 | Temp 98.6°F | Resp 12 | Ht 66.0 in | Wt 210.7 lb

## 2013-01-21 DIAGNOSIS — F331 Major depressive disorder, recurrent, moderate: Secondary | ICD-10-CM

## 2013-01-21 DIAGNOSIS — E890 Postprocedural hypothyroidism: Secondary | ICD-10-CM

## 2013-01-21 DIAGNOSIS — E119 Type 2 diabetes mellitus without complications: Secondary | ICD-10-CM

## 2013-01-21 DIAGNOSIS — E7800 Pure hypercholesterolemia, unspecified: Secondary | ICD-10-CM

## 2013-01-21 LAB — URINALYSIS, ROUTINE W REFLEX MICROSCOPIC
Bilirubin Urine: NEGATIVE
Glucose, UA: >1000 mg/dL — AB
Hgb urine dipstick: NEGATIVE
Ketones, ur: NEGATIVE mg/dL
Leukocytes, UA: NEGATIVE
Nitrite: NEGATIVE
Protein, ur: NEGATIVE mg/dL
Specific Gravity, Urine: 1.017 (ref 1.005–1.030)
Urobilinogen, UA: 0.2 mg/dL (ref 0.0–1.0)
pH: 6 (ref 5.0–8.0)

## 2013-01-21 LAB — URINALYSIS, MICROSCOPIC ONLY
Bacteria, UA: NONE SEEN
Casts: NONE SEEN
Crystals: NONE SEEN
Squamous Epithelial / LPF: NONE SEEN

## 2013-01-21 LAB — MICROALBUMIN / CREATININE URINE RATIO
Creatinine, Urine: 42.8 mg/dL
Microalb Creat Ratio: 11.7 mg/g (ref 0.0–30.0)
Microalb, Ur: 0.5 mg/dL (ref 0.00–1.89)

## 2013-01-21 MED ORDER — EFINACONAZOLE 10 % EX SOLN: 1.0000 [drp] | CUTANEOUS | Status: DC

## 2013-01-21 NOTE — Telephone Encounter (Signed)
Informed pt her fungal culture was negative for fungus,  Dr Al Corpus ordered Jublia 10% topical solution 1 bottle apply 1 drop to affected toenail daily 11 refills.

## 2013-01-21 NOTE — Patient Instructions (Addendum)
Stop Victoza  STOP PHENTEREMINE  Please check blood sugars at least half the time about 2 hours after any meal and as directed on waking up. Please bring blood sugar monitor to each visit  If sugar < 70 stop Glimeperide
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
## 2013-01-22 ENCOUNTER — Other Ambulatory Visit: Payer: Self-pay | Admitting: *Deleted

## 2013-01-22 MED ORDER — LEVOTHYROXINE SODIUM 175 MCG PO TABS: 175.0000 ug | ORAL_TABLET | Freq: Every day | ORAL | Status: DC

## 2013-01-23 ENCOUNTER — Other Ambulatory Visit: Payer: Self-pay | Admitting: *Deleted

## 2013-01-23 MED ORDER — ATORVASTATIN CALCIUM 10 MG PO TABS: 10.0000 mg | ORAL_TABLET | Freq: Every day | ORAL | Status: DC

## 2013-01-28 ENCOUNTER — Ambulatory Visit (INDEPENDENT_AMBULATORY_CARE_PROVIDER_SITE_OTHER): Payer: Medicaid Other | Admitting: Neurology

## 2013-01-28 ENCOUNTER — Encounter: Payer: Self-pay | Admitting: Neurology

## 2013-01-28 VITALS — BP 120/80 | HR 82 | Temp 98.0°F | Ht 66.0 in | Wt 208.4 lb

## 2013-01-28 DIAGNOSIS — R079 Chest pain, unspecified: Secondary | ICD-10-CM

## 2013-01-28 DIAGNOSIS — G7000 Myasthenia gravis without (acute) exacerbation: Secondary | ICD-10-CM

## 2013-01-28 NOTE — Patient Instructions (Addendum)
Call [phone] to speak with someone about getting a PCP. 1.  Continue  prednisone 10mg  and Imuran 100mg  daily 2.  Increase mestinon to 60mg  QID 3.  Follow-up with PCP for chest pain.  Should she develop worsening pain, she should go to Urgent Care or ED.
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
## 2013-02-19 ENCOUNTER — Other Ambulatory Visit: Payer: Self-pay | Admitting: Endocrinology

## 2013-02-25 ENCOUNTER — Telehealth: Payer: Self-pay | Admitting: Neurology

## 2013-02-25 NOTE — Telephone Encounter (Signed)
Spoke with patients caregiver she was concerned about the new PCP office she has an appointment with. Didn't know personally about the office asked some of the other girls in the office. Advised patient to give [place] a call if she needed [place]

## 2013-02-25 NOTE — Telephone Encounter (Signed)
Please call caregiver, Re: PCP appt. CB# [phone] / Sherri S.

## 2013-02-26 ENCOUNTER — Ambulatory Visit: Payer: Self-pay | Admitting: Internal Medicine

## 2013-03-01 ENCOUNTER — Ambulatory Visit: Payer: Medicaid Other | Attending: Internal Medicine | Admitting: Internal Medicine

## 2013-03-01 VITALS — BP 122/89 | HR 100 | Temp 98.6°F | Resp 16 | Wt 206.8 lb

## 2013-03-01 DIAGNOSIS — R7611 Nonspecific reaction to tuberculin skin test without active tuberculosis: Secondary | ICD-10-CM | POA: Insufficient documentation

## 2013-03-01 DIAGNOSIS — G7000 Myasthenia gravis without (acute) exacerbation: Secondary | ICD-10-CM

## 2013-03-01 DIAGNOSIS — F329 Major depressive disorder, single episode, unspecified: Secondary | ICD-10-CM

## 2013-03-01 DIAGNOSIS — F32A Depression, unspecified: Secondary | ICD-10-CM

## 2013-03-01 MED ORDER — ONDANSETRON 4 MG PO TBDP: 8.0000 mg | ORAL_TABLET | Freq: Three times a day (TID) | ORAL | Status: DC | PRN

## 2013-03-01 MED ORDER — FLUTICASONE PROPIONATE 50 MCG/ACT NA SUSP: 1.0000 | Freq: Every day | NASAL | Status: DC

## 2013-03-01 MED ORDER — TRAMADOL HCL 50 MG PO TABS: 50.0000 mg | ORAL_TABLET | Freq: Three times a day (TID) | ORAL | Status: DC | PRN

## 2013-03-01 MED ORDER — HYDROQUINONE 4 % EX CREA: TOPICAL_CREAM | Freq: Two times a day (BID) | CUTANEOUS | Status: DC

## 2013-03-01 MED ORDER — MECLIZINE HCL 25 MG PO TABS: 25.0000 mg | ORAL_TABLET | Freq: Three times a day (TID) | ORAL | Status: DC | PRN

## 2013-03-01 MED ORDER — BECLOMETHASONE DIPROPIONATE 80 MCG/ACT IN AERS: 2.0000 | INHALATION_SPRAY | Freq: Two times a day (BID) | RESPIRATORY_TRACT | Status: DC

## 2013-03-01 MED ORDER — CYCLOBENZAPRINE HCL 10 MG PO TABS: 10.0000 mg | ORAL_TABLET | Freq: Three times a day (TID) | ORAL | Status: DC | PRN

## 2013-03-01 MED ORDER — ALBUTEROL SULFATE HFA 108 (90 BASE) MCG/ACT IN AERS: 2.0000 | INHALATION_SPRAY | Freq: Four times a day (QID) | RESPIRATORY_TRACT | Status: DC | PRN

## 2013-03-01 NOTE — Progress Notes (Signed)
Patient here to establish care History of DM Migraines Myasthenia gravis Bipolar Depression ADHD Personality disorder Asthma Trigeminal neuralgia Graves disease hypothyroid Arthralgia Degenerative disease disorder  vertigo
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
## 2013-03-11 ENCOUNTER — Other Ambulatory Visit: Payer: Self-pay | Admitting: Emergency Medicine

## 2013-03-11 ENCOUNTER — Telehealth: Payer: Self-pay | Admitting: Emergency Medicine

## 2013-03-11 MED ORDER — CLOBETASOL PROPIONATE 0.05 % EX CREA: TOPICAL_CREAM | CUTANEOUS | Status: DC

## 2013-03-18 ENCOUNTER — Telehealth: Payer: Self-pay

## 2013-03-18 ENCOUNTER — Ambulatory Visit: Payer: Self-pay | Admitting: Podiatry

## 2013-03-18 ENCOUNTER — Other Ambulatory Visit: Payer: Self-pay | Admitting: *Deleted

## 2013-03-18 MED ORDER — METFORMIN HCL 1000 MG PO TABS: 1000.0000 mg | ORAL_TABLET | Freq: Two times a day (BID) | ORAL | Status: DC

## 2013-03-18 MED ORDER — ACCU-CHEK FASTCLIX LANCETS MISC: Status: DC

## 2013-03-19 ENCOUNTER — Encounter: Payer: Self-pay | Admitting: Internal Medicine

## 2013-03-19 ENCOUNTER — Other Ambulatory Visit: Payer: Self-pay | Admitting: *Deleted

## 2013-03-19 ENCOUNTER — Ambulatory Visit: Payer: Medicaid Other | Attending: Internal Medicine | Admitting: Internal Medicine

## 2013-03-19 VITALS — BP 104/72 | HR 96 | Temp 99.4°F | Resp 14 | Ht 66.0 in | Wt 206.0 lb

## 2013-03-19 DIAGNOSIS — IMO0001 Reserved for inherently not codable concepts without codable children: Secondary | ICD-10-CM | POA: Insufficient documentation

## 2013-03-19 DIAGNOSIS — M797 Fibromyalgia: Secondary | ICD-10-CM

## 2013-03-19 MED ORDER — PYRITHIONE ZINC 2 % EX SHAM: 1.0000 | MEDICATED_SHAMPOO | CUTANEOUS | Status: DC | PRN

## 2013-03-19 MED ORDER — ACCU-CHEK FASTCLIX LANCETS MISC: Status: DC

## 2013-03-19 MED ORDER — GLUCOSE BLOOD VI STRP: ORAL_STRIP | Status: DC

## 2013-03-19 MED ORDER — ONDANSETRON 4 MG PO TBDP: 8.0000 mg | ORAL_TABLET | Freq: Three times a day (TID) | ORAL | Status: DC | PRN

## 2013-03-19 MED ORDER — LISINOPRIL 10 MG PO TABS: 10.0000 mg | ORAL_TABLET | Freq: Every day | ORAL | Status: DC

## 2013-03-19 MED ORDER — TRAMADOL HCL 50 MG PO TABS: 50.0000 mg | ORAL_TABLET | Freq: Three times a day (TID) | ORAL | Status: DC | PRN

## 2013-03-19 MED ORDER — OXYCODONE-ACETAMINOPHEN 5-325 MG PO TABS: 1.0000 | ORAL_TABLET | ORAL | Status: DC | PRN

## 2013-03-19 MED ORDER — MECLIZINE HCL 25 MG PO TABS: 25.0000 mg | ORAL_TABLET | Freq: Three times a day (TID) | ORAL | Status: DC | PRN

## 2013-03-19 MED ORDER — IBUPROFEN 600 MG PO TABS: 600.0000 mg | ORAL_TABLET | Freq: Three times a day (TID) | ORAL | Status: DC | PRN

## 2013-03-19 MED ORDER — POTASSIUM CHLORIDE CRYS ER 20 MEQ PO TBCR: EXTENDED_RELEASE_TABLET | ORAL | Status: DC

## 2013-03-19 MED ORDER — DIFLUNISAL 500 MG PO TABS: 500.0000 mg | ORAL_TABLET | Freq: Two times a day (BID) | ORAL | Status: DC

## 2013-03-19 MED ORDER — ZOLMITRIPTAN 5 MG PO TABS: 5.0000 mg | ORAL_TABLET | ORAL | Status: DC | PRN

## 2013-03-19 NOTE — Patient Instructions (Signed)
Fibromyalgia Fibromyalgia is a disorder that is often misunderstood. It is associated with muscular pains and tenderness that comes and goes. It is often associated with fatigue and sleep disturbances. Though it tends to be long-lasting, fibromyalgia is not life-threatening. CAUSES  The exact cause of fibromyalgia is unknown. People with certain gene types are predisposed to developing fibromyalgia and other conditions. Certain factors can play a role as triggers, such as:  Spine disorders.  Arthritis.  Severe injury (trauma) and other physical stressors.  Emotional stressors. SYMPTOMS   The main symptom is pain and stiffness in the muscles and joints, which can vary over time.  Sleep and fatigue problems. Other related symptoms may include:  Bowel and bladder problems.  Headaches.  Visual problems.  Problems with odors and noises.  Depression or mood changes.  Painful periods (dysmenorrhea).  Dryness of the skin or eyes. DIAGNOSIS  There are no specific tests for diagnosing fibromyalgia. Patients can be diagnosed accurately from the specific symptoms they have. The diagnosis is made by determining that nothing else is causing the problems. TREATMENT  There is no cure. Management includes medicines and an active, healthy lifestyle. The goal is to enhance physical fitness, decrease pain, and improve sleep. HOME CARE INSTRUCTIONS   Only take over-the-counter or prescription medicines as directed by your caregiver. Sleeping pills, tranquilizers, and pain medicines may make your problems worse.  Low-impact aerobic exercise is very important and advised for treatment. At first, it may seem to make pain worse. Gradually increasing your tolerance will overcome this feeling.  Learning relaxation techniques and how to control stress will help you. Biofeedback, visual imagery, hypnosis, muscle relaxation, yoga, and meditation are all options.  Anti-inflammatory medicines and  physical therapy may provide short-term help.  Acupuncture or massage treatments may help.  Take muscle relaxant medicines as suggested by your caregiver.  Avoid stressful situations.  Plan a healthy lifestyle. This includes your diet, sleep, rest, exercise, and friends.  Find and practice a hobby you enjoy.  Join a fibromyalgia support group for interaction, ideas, and sharing advice. This may be helpful. SEEK MEDICAL CARE IF:  You are not having good results or improvement from your treatment. FOR MORE INFORMATION  National Fibromyalgia Association: www.fmaware.org Arthritis Foundation: www.arthritis.org Document Released: 03/06/2005 Document Revised: 05/29/2011 Document Reviewed: 06/16/2009 ExitCare Patient Information 2014 [employer].  
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
## 2013-03-19 NOTE — Progress Notes (Signed)
Pt needs another referral to pain management.

## 2013-03-31 ENCOUNTER — Telehealth: Payer: Self-pay

## 2013-03-31 ENCOUNTER — Telehealth: Payer: Self-pay | Admitting: *Deleted

## 2013-03-31 ENCOUNTER — Other Ambulatory Visit: Payer: Self-pay | Admitting: *Deleted

## 2013-03-31 MED ORDER — ACCU-CHEK NANO SMARTVIEW W/DEVICE KIT: PACK | Status: DC

## 2013-03-31 MED ORDER — ACCU-CHEK FASTCLIX LANCETS MISC: Status: DC

## 2013-03-31 NOTE — Telephone Encounter (Signed)
Pt has medicaid and her niece states she is power of attorney. Niece states pt has several health issues and would like to know if we will accept

## 2013-03-31 NOTE — Telephone Encounter (Signed)
The patient's family member called hoping to get the pt an apt with [facility], however, she has medicaid.  She states she does not have [place] access, and she does not have a pcp listed.  Can she be seen as a new pt?  i told the the family member i would double check and document in the chart that i asked. Thanks!

## 2013-04-04 ENCOUNTER — Ambulatory Visit (INDEPENDENT_AMBULATORY_CARE_PROVIDER_SITE_OTHER): Payer: Medicaid Other | Admitting: Neurology

## 2013-04-04 ENCOUNTER — Telehealth: Payer: Self-pay | Admitting: Emergency Medicine

## 2013-04-04 ENCOUNTER — Telehealth: Payer: Self-pay | Admitting: Internal Medicine

## 2013-04-04 ENCOUNTER — Ambulatory Visit: Payer: Self-pay | Admitting: Internal Medicine

## 2013-04-04 ENCOUNTER — Telehealth: Payer: Self-pay | Admitting: Neurology

## 2013-04-04 ENCOUNTER — Encounter: Payer: Self-pay | Admitting: Neurology

## 2013-04-04 VITALS — BP 110/80 | HR 86 | Temp 97.9°F | Ht 66.0 in | Wt 206.4 lb

## 2013-04-04 DIAGNOSIS — G7000 Myasthenia gravis without (acute) exacerbation: Secondary | ICD-10-CM

## 2013-04-04 LAB — CBC
HCT: 42.7 % (ref 36.0–46.0)
Hemoglobin: 13.7 g/dL (ref 12.0–15.0)
MCHC: 32.2 g/dL (ref 30.0–36.0)
MCV: 80.1 fl (ref 78.0–100.0)
Platelets: 293 10*3/uL (ref 150.0–400.0)
RBC: 5.34 Mil/uL — ABNORMAL HIGH (ref 3.87–5.11)
RDW: 14.2 % (ref 11.5–14.6)
WBC: 7.1 10*3/uL (ref 4.5–10.5)

## 2013-04-04 LAB — COMPREHENSIVE METABOLIC PANEL
ALT: 15 U/L (ref 0–35)
AST: 14 U/L (ref 0–37)
Albumin: 3.7 g/dL (ref 3.5–5.2)
Alkaline Phosphatase: 89 U/L (ref 39–117)
BUN: 15 mg/dL (ref 6–23)
CO2: 30 mEq/L (ref 19–32)
Calcium: 9.3 mg/dL (ref 8.4–10.5)
Chloride: 101 mEq/L (ref 96–112)
Creatinine, Ser: 0.8 mg/dL (ref 0.4–1.2)
GFR: 107.83 mL/min (ref >60.00)
Glucose, Bld: 106 mg/dL — ABNORMAL HIGH (ref 70–99)
Potassium: 3.8 mEq/L (ref 3.5–5.1)
Sodium: 137 mEq/L (ref 135–145)
Total Bilirubin: 0.5 mg/dL (ref 0.3–1.2)
Total Protein: 7.3 g/dL (ref 6.0–8.3)

## 2013-04-04 MED ORDER — ACETAMINOPHEN-CODEINE #3 300-30 MG PO TABS: 1.0000 | ORAL_TABLET | ORAL | Status: DC | PRN

## 2013-04-04 MED ORDER — ACETAMINOPHEN-CODEINE #3 300-30 MG PO TABS
1.0000 | ORAL_TABLET | ORAL | Status: DC | PRN
Start: 2013-04-04 — End: 2013-04-17

## 2013-04-04 MED ORDER — ONDANSETRON 8 MG PO TBDP: 8.0000 mg | ORAL_TABLET | Freq: Three times a day (TID) | ORAL | Status: DC | PRN

## 2013-04-04 MED ORDER — SUMATRIPTAN SUCCINATE 50 MG PO TABS: 50.0000 mg | ORAL_TABLET | Freq: Once | ORAL | Status: DC

## 2013-04-04 NOTE — Progress Notes (Signed)
[facility]  Follow-up Visit   Date: 04/04/2013   Jade Mathis MRN: [id number] DOB: Dec 01, 1975   Interim History:  Jade Mathis is a 38 y.o. 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 Patient is accompanied by her husband.  She was last seen in the clinic on 01/28/2013.   He complains of whole body pain, headaches, nausea, abdominal, chest pain and has been having a difficult time trying to establish care with pain management because she tells me that malingering has been documented in her chart, but it is not true.    Regarding her myasthenia, there has been no new worsening of symptoms though she feels tired all the time because of her pain.  Denies any ptosis, dysphagia, dysarthria, or generalized weakness.    History of present illness: She was diagnosed with myasthenia gravis in 06/01/1995 at which time symptoms were manifested with systemic weakness, double vision, ptosis, and problems with swallowing and speaking. Diagnosis was made based on repetitive nerve stimulation and blood tests, however we do not have these records. 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 Around the same time, prednisone and mestinon was initiated. At some point, she was started on azathioprine. Imuran was stopped in May 31, 2002 because of an unplanned pregnancy and she remained on prednisone 82m until 214-Mar-2011when she had an exacerbation requiring out-patient PLEX. In 22011/03/14 Imuran was restarted (dose unknown). She been on prednisone since 114-Mar-1997and the lowest dose is her current dose at 190mdaily. Cellcept was tried but she  developed a rash so it was stopped.   [facility] and [facility]. Most recently (12/2011 - 07/2012), she [facility]  When she initially saw Dr. WoJacelyn Gripn April 2013, she was only on prednisone 1055mnd mestinon 61m58mD and demonstrated clinical signs of weakness, therefore Imuran was restarted.  There was also a huge overlay of depression which started after her father's death in 2013March 14, 2013n he died within a month of being diagnosed with lung cancer diagnosis. She sees Dr. AkinDarleene [facility]aver[facility] has adjusted her bipolar medications and has behavorial therapist that is coming to her home.    Of note, she has a complex headache history and sees Dr. FreeDomingo Cocking chronic migraines.    10/2 Follow-up:  Reduced Mestinon 61mg47m because she was complaining of diarrhea and cramping, but there was no change. She also started taking INH 300mg 4mvitamin B6 25mg f46mositive PPD.    11/11 Follow-up:  Mestinon increased to 61mg QI45me to patient feeling that she got weaker on TID dosing.   Medications:  Current Outpatient Prescriptions on File Prior to Visit  Medication Sig Dispense Refill  . ACCU-CHEK FASTCLIX LANCETS MISC Use to check blood sugars once a day  100 each  5  . albuterol (PROVENTIL HFA) 108 (90 BASE) MCG/ACT inhaler Inhale 2 puffs into the lungs every 6 (six) hours as needed for wheezing.  1 Inhaler  2  . albuterol (PROVENTIL) (2.5 MG/3ML) 0.083% nebulizer solution Take 2.5 mg by  nebulization every 4 (four) hours as needed. Inhale 3 ml every 4 hours for dyspnea. Give supply for 60 doses       . amphetamine-dextroamphetamine (ADDERALL) 30 MG tablet Take 60 mg by mouth daily.      Marland Kitchen atorvastatin (LIPITOR) 10 MG tablet Take 1 tablet (10 mg  total) by mouth daily with breakfast.  30 tablet  5  . azathioprine (IMURAN) 100 MG tablet Take 1 tablet (100 mg total) by mouth daily.  60 tablet  5  . beclomethasone (QVAR) 80 MCG/ACT inhaler Inhale 2 puffs into the lungs 2 (two) times daily.  1 Inhaler  12  . Beclomethasone Dipropionate (QVAR IN) Inhale into the lungs.      . Blood Glucose Monitoring Suppl (ACCU-CHEK NANO SMARTVIEW) W/DEVICE KIT Use to check blood sugars once a day dx code 250.00  1 kit  0  . Canagliflozin (INVOKANA) 100 MG TABS Take 1 tablet (100 mg total) by mouth daily.  30 tablet  5  . clobetasol cream (TEMOVATE) 0.05 % Apply twice a day to affected area  30 g  2  . clonazePAM (KLONOPIN) 1 MG tablet Take 1 mg by mouth at bedtime as needed for anxiety.      . clonazePAM (KLONOPIN) 2 MG tablet Take 2 mg by mouth daily.      . cyclobenzaprine (FLEXERIL) 10 MG tablet Take 1 tablet (10 mg total) by mouth 3 (three) times daily as needed for muscle spasms.  30 tablet  3  . diflunisal (DOLOBID) 500 MG TABS tablet Take 1 tablet (500 mg total) by mouth 2 (two) times daily.  60 tablet  1  . Efinaconazole (JUBLIA) 10 % SOLN Apply 1 drop topically 1 day or 1 dose.  1 Bottle  11  . fluticasone (FLONASE) 50 MCG/ACT nasal spray Place 1 spray into both nostrils daily.  16 g  2  . gabapentin (NEURONTIN) 300 MG capsule Take 300 mg by mouth. 1-2 tabs qhs      . glimepiride (AMARYL) 1 MG tablet Take 1 tablet (1 mg total) by mouth daily before breakfast.  30 tablet  5  . glucose blood (ACCU-CHEK AVIVA PLUS) test strip Use as instructed to check blood sugars 3 times per day, dx code 250.00  100 each  12  . haloperidol (HALDOL) 2 MG tablet Take 2 mg by mouth at bedtime.      . hydroquinone 4 % cream Apply topically 2 (two) times daily.  28.35 g  2  . ibuprofen (ADVIL,MOTRIN) 600 MG tablet Take 1 tablet (600 mg total) by mouth every 8 (eight) hours as needed.  30 tablet  0  . isoniazid (NYDRAZID) 100 MG tablet Take 100 mg by mouth. Three pills  daily      . levothyroxine (SYNTHROID, LEVOTHROID) 175 MCG tablet Take 1 tablet (175 mcg total) by mouth daily.  30 tablet  5  . Liraglutide 18 MG/3ML SOPN Inject 1.8 mg into the skin daily.  9 mL  2  . lisinopril (PRINIVIL,ZESTRIL) 10 MG tablet Take 1 tablet (10 mg total) by mouth daily.  30 tablet  0  . lurasidone (LATUDA) 40 MG TABS tablet Take by mouth. After dinner      . meclizine (ANTIVERT) 25 MG tablet Take 1 tablet (25 mg total) by mouth 3 (three) times daily as needed.  30 tablet  1  . metFORMIN (GLUCOPHAGE) 1000 MG tablet Take 1 tablet (1,000 mg total) by mouth 2 (two) times daily.  [place]  tablet  1  . ondansetron (ZOFRAN-ODT) 4 MG disintegrating tablet Take 2 tablets (8 mg total) by mouth every 8 (eight) hours as needed for nausea.  40 tablet  0  . ondansetron (ZOFRAN-ODT) 8 MG disintegrating tablet Take 8 mg by mouth every 8 (eight) hours as needed for nausea or vomiting.      Marland Kitchen oxyCODONE-acetaminophen (PERCOCET) 10-325 MG per tablet Take 1 tablet by mouth See admin instructions. Take one tablet 5 times daily as needed for pain      . oxyCODONE-acetaminophen (PERCOCET/ROXICET) 5-325 MG per tablet Take 1 tablet by mouth every 4 (four) hours as needed.  20 tablet  0  . phentermine 30 MG capsule Take 30 mg by mouth every morning.      . potassium chloride SA (KLOR-CON M20) 20 MEQ tablet TAKE ONE TABLET DAILY FOR 30 DAYS  30 tablet  6  . predniSONE (DELTASONE) 10 MG tablet Take 1 tablet (10 mg total) by mouth daily.  30 tablet  5  . pyridostigmine (MESTINON) 60 MG tablet Take 60 mg by mouth 3 (three) times daily. Four times a day.      . pyridOXINE (VITAMIN B-6) 25 MG tablet Take 25 mg by mouth daily.      . Pyrithione Zinc 2 % SHAM Apply 1 Tube topically as needed.  1 Bottle  0  . Tapentadol HCl (NUCYNTA ER) 100 MG TB12 Take 100 mg by mouth every 12 (twelve) hours as needed.      . topiramate (TOPAMAX) 200 MG tablet Take 200 mg by mouth daily.      . traMADol (ULTRAM) 50 MG tablet Take 1  tablet (50 mg total) by mouth every 8 (eight) hours as needed.  30 tablet  0  . valACYclovir (VALTREX) 500 MG tablet Take 500 mg by mouth 2 (two) times daily as needed. For outbreak       . venlafaxine XR (EFFEXOR-XR) 37.5 MG 24 hr capsule Take 37.5 mg by mouth daily.      Marland Kitchen zolmitriptan (ZOMIG) 5 MG tablet Take 1 tablet (5 mg total) by mouth as needed for migraine.  10 tablet  1   No current facility-administered medications on file prior to visit.    Allergies:  Allergies  Allergen Reactions  . Depo-Provera [Medroxyprogesterone] Other (See Comments)    Bad headaches.  . Vicodin [Hydrocodone-Acetaminophen] Nausea Only     Review of Systems:  CONSTITUTIONAL: No fevers, chills, night sweats or weight loss.  EYES: No visual changes or eye pain ENT: No hearing changes.  No history of nose bleeds.   RESPIRATORY: No cough, wheezing, shortness of breath.   CARDIOVASCULAR: + chest pain, and palpitations.   GI: Negative for abdominal discomfort, blood in stools or black stools.      GU:  No history of incontinence.   MUSCLOSKELETAL: + joint pain or swelling.  + myalgias.   SKIN: Negative for lesions, rash, and itching.   HEMATOLOGY/ONCOLOGY: Negative for prolonged bleeding, bruising easily, and swollen nodes.  No history of cancer.   ENDOCRINE: Negative for cold or heat intolerance, polydipsia or goiter.   PSYCH:  + depression or anxiety symptoms.   NEURO: As Above.   Vital Signs:  BP 110/80  Pulse 86  Temp(Src) 97.9 F (36.6 C) (Oral)  Ht $R'5\' 6"'KZ$  (1.676 m)  Wt 206 lb 6.4 oz (93.622 kg)  BMI 33.33 kg/m2   Tenderness to palpation over the right chest and thoracic  Neurological Exam: Severely blunted affect, very  poor eye contact, frustrated   CRANIAL NERVES: Pupils are round and reactive to light. Extraocular movements are intact. Face is symmetric. There is no weakness of the face muscles. Tongue movements are slowed.  MOTOR:  Motor strength is 5/5 in all extremities with  encouragement.     MSRs: Trace reflexes throughout with absent ankle jerks bilaterally.  SENSORY:  Reduced pinprick and vibration over the right foot. Sensation is intact on the left side.  GAIT:  She walks slowly assisted with a cane. Gait appears stable. Her right leg is externally rotated (old)   IMPRESSION: 1.  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  Clinically stable, exam remains unchanged 1.   Prednisone 32m daily. 1. Imuran 1012mdaily (she reports taking this twice a day) 2. Mestinon 6019mID       Because she is on Imuran, I will check CBC and CMP today  2.  Paresthesias of the right foot - new problem  - ?early peripheral neuropathy  - I discussed that an EMG can be ordered but due to pain and intolerance it has been deferred.   - Check vitamin B12, copper, and MMA 3.  Bipolar disorder with depression, seeing Dr. AkiDarleene Cleaver [facility]  Positive PPD, on INH 5.  Steroid-induced diabetes, followed by Dr. KumDwyane Dee  Chronic migraines, followed by Dr. FreDomingo Cocking Patient requesting refill for Imitrex 50 mg  7.  Fibromyalgia and chronic pain  - She is requesting pain medications today  - I explained that she needs to see pain management for this 8.  Return to clinic in 6-m74-monthhe duration of this appointment visit was 30 minutes of face-to-face time with the patient.  Greater than 50% of this time was spent in counseling, explanation of diagnosis, planning of further management, and coordination of care.   Thank you for allowing me to participate in patient's care.  If I can answer any additional questions, I would be pleased to do so.    Sincerely,    Donika K. PatePosey Pronto

## 2013-04-04 NOTE — Telephone Encounter (Signed)
Pt called regarding a refill of her pain medication, pt can't find a pain management doctor and is in a lot of pain. Please contact pt.

## 2013-04-04 NOTE — Telephone Encounter (Signed)
Spoke with pt caregiver Erskine Squibb regarding pain med request. Pt informed we can prescribe Tylenol #3 only #60, no refills Scripts called in [place] on [address]

## 2013-04-04 NOTE — Telephone Encounter (Signed)
Prior approval for Sumatriptan 50 mg received from [facility]. Form complete and faxed to [facility] at [phone]. Confirmation received. Sent to scan to [employer].

## 2013-04-04 NOTE — Patient Instructions (Addendum)
Continue Prednisone 10mg  daily, Imuran 100mg  daily, and Mestinon 60mg  4 times daily

## 2013-04-04 NOTE — Telephone Encounter (Signed)
The family member called back and is asking for an update on if this patient can be seen.

## 2013-04-10 NOTE — Telephone Encounter (Signed)
We are not a Medicaid provider and cannot see Medicaid.

## 2013-04-11 ENCOUNTER — Other Ambulatory Visit: Payer: Self-pay | Admitting: *Deleted

## 2013-04-14 MED ORDER — PREDNISONE 10 MG PO TABS: 10.0000 mg | ORAL_TABLET | Freq: Every day | ORAL | Status: DC

## 2013-04-14 NOTE — Telephone Encounter (Signed)
Patient notified

## 2013-04-15 ENCOUNTER — Encounter: Payer: Self-pay | Admitting: Physician Assistant

## 2013-04-15 ENCOUNTER — Ambulatory Visit (INDEPENDENT_AMBULATORY_CARE_PROVIDER_SITE_OTHER): Payer: Medicaid Other | Admitting: Physician Assistant

## 2013-04-15 ENCOUNTER — Ambulatory Visit (INDEPENDENT_AMBULATORY_CARE_PROVIDER_SITE_OTHER)
Admission: RE | Admit: 2013-04-15 | Discharge: 2013-04-15 | Disposition: A | Discharge status: Home / Self Care | Payer: Medicaid Other | Source: Ambulatory Visit | Attending: Physician Assistant | Admitting: Physician Assistant

## 2013-04-15 VITALS — BP 140/80 | Temp 98.6°F

## 2013-04-15 DIAGNOSIS — S298XXA Other specified injuries of thorax, initial encounter: Secondary | ICD-10-CM

## 2013-04-15 DIAGNOSIS — G8929 Other chronic pain: Secondary | ICD-10-CM

## 2013-04-15 DIAGNOSIS — S299XXA Unspecified injury of thorax, initial encounter: Secondary | ICD-10-CM

## 2013-04-15 DIAGNOSIS — M797 Fibromyalgia: Secondary | ICD-10-CM

## 2013-04-15 DIAGNOSIS — IMO0001 Reserved for inherently not codable concepts without codable children: Secondary | ICD-10-CM

## 2013-04-15 NOTE — Progress Notes (Signed)
Pre-visit discussion using our clinic review tool. No additional management support is needed unless otherwise documented below in the visit note.  

## 2013-04-15 NOTE — Patient Instructions (Signed)
Continue all medications as prescribed.  Chest Wall Pain Chest wall pain is pain felt in or around the chest bones and muscles. It may take up to 6 weeks to get better. It may take longer if you are active. Chest wall pain can happen on its own. Other times, things like germs, injury, coughing, or exercise can cause the pain. HOME CARE   Avoid activities that make you tired or cause pain. Try not to use your chest, belly (abdominal), or side muscles. Do not use heavy weights.  Put ice on the sore area.  Put ice in a plastic bag.  Place a towel between your skin and the bag.  Leave the ice on for 15-20 minutes for the first 2 days.  Only take medicine as told by your doctor. GET HELP RIGHT AWAY IF:   You have more pain or are very uncomfortable.  You have a fever.  Your chest pain gets worse.  You have new problems.  You feel sick to your stomach (nauseous) or throw up (vomit).  You start to sweat or feel lightheaded.  You have a cough with mucus (phlegm).  You cough up blood. MAKE SURE YOU:   Understand these instructions.  Will watch your condition.  Will get help right away if you are not doing well or get worse. Document Released: 08/23/2007 Document Revised: 05/29/2011 Document Reviewed: 10/31/2010 [facility] Patient Information 2014 [place], [place]. Chronic Pain Chronic pain can be defined as pain that is off and on and lasts for 3 6 months or longer. Many things cause chronic pain, which can make it difficult to make a diagnosis. There are many treatment options available for chronic pain. However, finding a treatment that works well for you may require trying various approaches until the right one is found. Many people benefit from a combination of two or more types of treatment to control their pain. SYMPTOMS  Chronic pain can occur anywhere in the body and can range from mild to very severe. Some types of chronic pain include:  Headache.  Low back  pain.  Cancer pain.  Arthritis pain.  Neurogenic pain. This is pain resulting from damage to nerves. People with chronic pain may also have other symptoms such as:  Depression.  Anger.  Insomnia.  Anxiety. DIAGNOSIS  Your health care provider will help diagnose your condition over time. In many cases, the initial focus will be on excluding possible conditions that could be causing the pain. Depending on your symptoms, your health care provider may order tests to diagnose your condition. Some of these tests may include:   Blood tests.   CT scan.   MRI.   X-rays.   Ultrasounds.   Nerve conduction studies.  You may need to see a specialist.  TREATMENT  Finding treatment that works well may take time. You may be referred to a pain specialist. He or she may prescribe medicine or therapies, such as:   Mindful meditation or yoga.  Shots (injections) of numbing or pain-relieving medicines into the spine or area of pain.  Local electrical stimulation.  Acupuncture.   Massage therapy.   Aroma, color, light, or sound therapy.   Biofeedback.   Working with a physical therapist to keep from getting stiff.   Regular, gentle exercise.   Cognitive or behavioral therapy.   Group support.  Sometimes, surgery may be recommended.  HOME CARE INSTRUCTIONS   Take all medicines as directed by your health care provider.   Lessen stress in  your life by relaxing and doing things such as listening to calming music.   Exercise or be active as directed by your health care provider.   Eat a healthy diet and include things such as vegetables, fruits, fish, and lean meats in your diet.   Keep all follow-up appointments with your health care provider.   Attend a support group with others suffering from chronic pain. SEEK MEDICAL CARE IF:   Your pain gets worse.   You develop a new pain that was not there before.   You cannot tolerate medicines given to  you by your health care provider.   You have new symptoms since your last visit with your health care provider.  SEEK IMMEDIATE MEDICAL CARE IF:   You feel weak.   You have decreased sensation or numbness.   You lose control of bowel or bladder function.   Your pain suddenly gets much worse.   You develop shaking.  You develop chills.  You develop confusion.  You develop chest pain.  You develop shortness of breath.  MAKE SURE YOU:  Understand these instructions.  Will watch your condition.  Will get help right away if you are not doing well or get worse. Document Released: 11/26/2001 Document Revised: 11/06/2012 Document Reviewed: 08/30/2012 [facility] Patient Information 2014 [place].

## 2013-04-16 ENCOUNTER — Telehealth: Payer: Self-pay | Admitting: Family Medicine

## 2013-04-16 ENCOUNTER — Ambulatory Visit: Payer: Self-pay

## 2013-04-16 NOTE — Telephone Encounter (Signed)
That is fine with me.
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
## 2013-04-16 NOTE — Telephone Encounter (Signed)
Called and gave the pt her x-ray results.  Pt would like to see an MD.  Would like to change providers.  Will need your permission to do so.  Please advise.  Thanks!

## 2013-04-16 NOTE — Telephone Encounter (Signed)
Please forward to another physician.  However, Dr. Asa Lente will not take this patient.

## 2013-04-17 ENCOUNTER — Encounter: Payer: Self-pay | Admitting: Internal Medicine

## 2013-04-17 ENCOUNTER — Ambulatory Visit: Payer: Self-pay

## 2013-04-17 ENCOUNTER — Ambulatory Visit: Payer: Medicaid Other | Attending: Internal Medicine | Admitting: Internal Medicine

## 2013-04-17 VITALS — BP 129/98 | HR 95 | Temp 98.5°F | Resp 16 | Wt 207.0 lb

## 2013-04-17 DIAGNOSIS — E1110 Type 2 diabetes mellitus with ketoacidosis without coma: Secondary | ICD-10-CM

## 2013-04-17 DIAGNOSIS — E1310 Other specified diabetes mellitus with ketoacidosis without coma: Secondary | ICD-10-CM

## 2013-04-17 DIAGNOSIS — E119 Type 2 diabetes mellitus without complications: Secondary | ICD-10-CM | POA: Insufficient documentation

## 2013-04-17 DIAGNOSIS — R110 Nausea: Secondary | ICD-10-CM

## 2013-04-17 DIAGNOSIS — M549 Dorsalgia, unspecified: Secondary | ICD-10-CM | POA: Insufficient documentation

## 2013-04-17 LAB — GLUCOSE, POCT (MANUAL RESULT ENTRY): POC Glucose: 113 mg/dl — AB (ref 70–99)

## 2013-04-17 MED ORDER — ONDANSETRON 8 MG PO TBDP: 8.0000 mg | ORAL_TABLET | Freq: Three times a day (TID) | ORAL | Status: DC | PRN

## 2013-04-17 MED ORDER — ACETAMINOPHEN-CODEINE #3 300-30 MG PO TABS: 1.0000 | ORAL_TABLET | ORAL | Status: DC | PRN
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
## 2013-04-17 NOTE — Progress Notes (Signed)
Pt here f/u chronic pain States she was denied pain management clinic Tylenol #3 given. Not working Pt has flat affect CBG -113 c/o nausea

## 2013-04-18 ENCOUNTER — Encounter: Payer: Self-pay | Admitting: Podiatry

## 2013-04-22 ENCOUNTER — Other Ambulatory Visit (INDEPENDENT_AMBULATORY_CARE_PROVIDER_SITE_OTHER): Payer: Medicaid Other

## 2013-04-22 ENCOUNTER — Telehealth: Payer: Self-pay | Admitting: Neurology

## 2013-04-22 DIAGNOSIS — E119 Type 2 diabetes mellitus without complications: Secondary | ICD-10-CM

## 2013-04-22 DIAGNOSIS — E890 Postprocedural hypothyroidism: Secondary | ICD-10-CM

## 2013-04-22 LAB — URINALYSIS, ROUTINE W REFLEX MICROSCOPIC
Bilirubin Urine: NEGATIVE
Hgb urine dipstick: NEGATIVE
Ketones, ur: NEGATIVE
Leukocytes, UA: NEGATIVE
Nitrite: NEGATIVE
Specific Gravity, Urine: 1.02 (ref 1.000–1.030)
Total Protein, Urine: NEGATIVE
Urine Glucose: >=1000 — AB
Urobilinogen, UA: 0.2 (ref 0.0–1.0)
pH: 5.5 (ref 5.0–8.0)

## 2013-04-22 LAB — TSH: TSH: 0.35 u[IU]/mL (ref 0.35–5.50)

## 2013-04-22 LAB — T4, FREE: Free T4: 1.25 ng/dL (ref 0.60–1.60)

## 2013-04-22 NOTE — Telephone Encounter (Signed)
Refill ondansetron received via [facility]. Denied per Dr Posey Pronto and faxed back. Patient to contact her PCP for additional refills.

## 2013-04-25 ENCOUNTER — Ambulatory Visit: Payer: Self-pay | Admitting: Endocrinology

## 2013-04-28 ENCOUNTER — Other Ambulatory Visit: Payer: Medicare Other | Admitting: *Deleted

## 2013-04-28 ENCOUNTER — Other Ambulatory Visit: Payer: Self-pay | Admitting: *Deleted

## 2013-04-28 ENCOUNTER — Ambulatory Visit (INDEPENDENT_AMBULATORY_CARE_PROVIDER_SITE_OTHER): Payer: Medicaid Other | Admitting: Endocrinology

## 2013-04-28 ENCOUNTER — Encounter: Payer: Self-pay | Admitting: Physical Medicine & Rehabilitation

## 2013-04-28 ENCOUNTER — Encounter: Payer: Self-pay | Admitting: Endocrinology

## 2013-04-28 VITALS — BP 110/64 | HR 87 | Temp 98.3°F | Resp 16 | Ht 66.0 in | Wt 202.8 lb

## 2013-04-28 DIAGNOSIS — E890 Postprocedural hypothyroidism: Secondary | ICD-10-CM

## 2013-04-28 DIAGNOSIS — E119 Type 2 diabetes mellitus without complications: Secondary | ICD-10-CM

## 2013-04-28 DIAGNOSIS — Z23 Encounter for immunization: Secondary | ICD-10-CM

## 2013-04-28 MED ORDER — GABAPENTIN 300 MG PO CAPS: 300.0000 mg | ORAL_CAPSULE | Freq: Two times a day (BID) | ORAL | Status: DC

## 2013-04-28 MED ORDER — METFORMIN HCL 1000 MG PO TABS: 1000.0000 mg | ORAL_TABLET | Freq: Two times a day (BID) | ORAL | Status: DC

## 2013-04-28 MED ORDER — CANAGLIFLOZIN 100 MG PO TABS: 100.0000 mg | ORAL_TABLET | Freq: Every day | ORAL | Status: DC

## 2013-04-28 NOTE — Patient Instructions (Signed)
Stop Phenteremine till next visit  Please check blood sugars at least half the time about 2 hours after any meal and as directed on waking up. Please bring blood sugar monitor to each visit
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
## 2013-05-02 ENCOUNTER — Other Ambulatory Visit: Payer: Self-pay | Admitting: *Deleted

## 2013-05-02 MED ORDER — GLUCOSE BLOOD VI STRP: ORAL_STRIP | Status: DC

## 2013-05-02 MED ORDER — GLIMEPIRIDE 1 MG PO TABS: 1.0000 mg | ORAL_TABLET | Freq: Every day | ORAL | Status: DC

## 2013-05-26 ENCOUNTER — Telehealth: Payer: Self-pay | Admitting: Internal Medicine

## 2013-05-26 ENCOUNTER — Telehealth: Payer: Self-pay

## 2013-05-26 NOTE — Telephone Encounter (Signed)
Pt LVM about needing a med refill. Please f/u with pt.

## 2013-05-26 NOTE — Telephone Encounter (Signed)
Patient requesting refill on tylenol #3

## 2013-06-02 ENCOUNTER — Ambulatory Visit: Payer: Self-pay | Admitting: Internal Medicine

## 2013-06-09 ENCOUNTER — Telehealth: Payer: Self-pay | Admitting: Neurology

## 2013-06-09 NOTE — Telephone Encounter (Signed)
Patient states it is an emergency to have someone call her--would not tell me what it is about--please call patient.

## 2013-06-09 NOTE — Telephone Encounter (Signed)
Called pt to inform her that she was dismissed from [facility] in 05/26/10 and was referred to another Neurologist. I informed the pt that it has been 3 years since her dismissal and that she would have to find another Neurologist, per Dr. Jannifer Franklin. Pt verbalized understanding.

## 2013-06-13 ENCOUNTER — Telehealth: Payer: Self-pay

## 2013-06-13 ENCOUNTER — Encounter: Payer: Self-pay | Admitting: Physical Medicine & Rehabilitation

## 2013-06-13 ENCOUNTER — Encounter: Payer: No Typology Code available for payment source | Admitting: Physical Medicine & Rehabilitation

## 2013-06-13 VITALS — BP 117/67 | HR 100 | Resp 14 | Ht 66.0 in | Wt 202.0 lb

## 2013-06-13 NOTE — Telephone Encounter (Signed)
Patient would like something prescribed for pain 
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
## 2013-06-16 ENCOUNTER — Encounter: Payer: Self-pay | Admitting: Internal Medicine

## 2013-06-16 ENCOUNTER — Ambulatory Visit: Payer: Medicaid Other | Attending: Internal Medicine | Admitting: Internal Medicine

## 2013-06-16 VITALS — BP 112/79 | HR 88 | Temp 97.8°F | Resp 16 | Wt 199.6 lb

## 2013-06-16 DIAGNOSIS — G8929 Other chronic pain: Secondary | ICD-10-CM | POA: Insufficient documentation

## 2013-06-16 DIAGNOSIS — E119 Type 2 diabetes mellitus without complications: Secondary | ICD-10-CM | POA: Insufficient documentation

## 2013-06-16 LAB — POCT GLYCOSYLATED HEMOGLOBIN (HGB A1C): Hemoglobin A1C: 5.8

## 2013-06-16 LAB — GLUCOSE, POCT (MANUAL RESULT ENTRY): POC Glucose: 87 mg/dl (ref 70–99)

## 2013-06-16 MED ORDER — ACETAMINOPHEN-CODEINE #3 300-30 MG PO TABS: 1.0000 | ORAL_TABLET | Freq: Three times a day (TID) | ORAL | Status: DC | PRN

## 2013-06-16 NOTE — Progress Notes (Signed)
Patient comes today requesting pain medications, patient has primary care physician at [facility] primary care, she has history of DM, well controlled following up with Endocrinologist, patient was given refill on Tylenol# 3,  30 pills, and advised to follow with her PCP, has scheduled appointment for tomorrow.

## 2013-06-16 NOTE — Progress Notes (Signed)
Patient here for follow up- DM Requesting refills on pain medications and regular prescribed medications

## 2013-06-17 ENCOUNTER — Ambulatory Visit: Payer: Self-pay | Admitting: Internal Medicine

## 2013-06-17 ENCOUNTER — Telehealth: Payer: Self-pay | Admitting: *Deleted

## 2013-06-17 NOTE — Telephone Encounter (Signed)
Jade Mathis want to speak with you when you are available.

## 2013-06-18 ENCOUNTER — Ambulatory Visit (INDEPENDENT_AMBULATORY_CARE_PROVIDER_SITE_OTHER): Payer: Medicaid Other | Admitting: Internal Medicine

## 2013-06-18 ENCOUNTER — Encounter: Payer: Self-pay | Admitting: Internal Medicine

## 2013-06-18 VITALS — BP 100/80 | HR 76 | Temp 99.1°F | Resp 16 | Wt 200.0 lb

## 2013-06-18 DIAGNOSIS — R209 Unspecified disturbances of skin sensation: Secondary | ICD-10-CM

## 2013-06-18 DIAGNOSIS — R202 Paresthesia of skin: Secondary | ICD-10-CM

## 2013-06-18 DIAGNOSIS — E119 Type 2 diabetes mellitus without complications: Secondary | ICD-10-CM

## 2013-06-18 DIAGNOSIS — G7000 Myasthenia gravis without (acute) exacerbation: Secondary | ICD-10-CM

## 2013-06-18 DIAGNOSIS — G894 Chronic pain syndrome: Secondary | ICD-10-CM

## 2013-06-18 DIAGNOSIS — E559 Vitamin D deficiency, unspecified: Secondary | ICD-10-CM

## 2013-06-18 MED ORDER — VITAMIN D 1000 UNITS PO TABS: 1000.0000 [IU] | ORAL_TABLET | Freq: Every day | ORAL | Status: DC

## 2013-06-18 MED ORDER — TRAMADOL HCL 50 MG PO TABS: 50.0000 mg | ORAL_TABLET | Freq: Two times a day (BID) | ORAL | Status: DC | PRN

## 2013-06-18 NOTE — Progress Notes (Signed)
Pre visit review using our clinic review tool, if applicable. No additional management support is needed unless otherwise documented below in the visit note. 

## 2013-06-18 NOTE — Progress Notes (Signed)
Subjective:     HPI  Pt is new to me - transfering care from [facility]  Pt tells me that she was accused of malingering Jul 16, 2010) by Dr Jannifer Franklin and forgery of pain prescriptions that were "all wrong" and were done by her sisters that "don't like me". She is asking for pain meds.  Jade Mathis is a 38 y.o. year-old left-handed African American female sero-positive myasthenia gravis (diagnosed 1995-07-16) s/p thymectomy, bipolar disorder, diabetes (?steroid-induced), hypertension, asthma, chronic headaches, and "trigeminal neuralgia" presenting for follow-up of myasthenia gravis. Patient is accompanied by her husband. She was last seen in the clinic on 01/28/2013.  He complains of whole body pain, headaches, nausea, abdominal, chest pain and has been having a difficult time trying to establish care with pain management because she tells me that malingering has been documented in her chart, but it is not true.  Regarding her myasthenia, there has been no new worsening of symptoms though she feels tired all the time because of her pain. Denies any ptosis, dysphagia, dysarthria, or generalized weakness.  History of present illness:  She was diagnosed with myasthenia gravis in 07-16-95 at which time symptoms were manifested with systemic weakness, double vision, ptosis, and problems with swallowing and speaking. Diagnosis was made based on repetitive nerve stimulation and blood tests, however we do not have these records. 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 Around the same time, prednisone and mestinon was initiated. At some point, she was started on azathioprine. Imuran was stopped in 07-16-02 because of an unplanned pregnancy and she remained on prednisone 20mg  until 07/15/2009 when she had an exacerbation requiring out-patient PLEX. In 07-15-09, Imuran was restarted  (dose unknown). She been on prednisone since July 16, 1995 and the lowest dose is her current dose at 10mg  daily. Cellcept was tried but she developed a rash so it was stopped.  The patient has previously been evaluated at [facility] by Dr. Truman Hayward 07-16-2010), discharged from [facility], and then established care with Dr. Jacelyn Grip at [facility] in 07-16-2011. Most recently (12/2011 - 07/2012), she was evaluated by Dr. Jannifer Franklin at [facility].  When she initially saw Dr. Jacelyn Grip in 07/16/11, she was only on prednisone 10mg  and mestinon 60mg  QID and demonstrated clinical signs of weakness, therefore Imuran was restarted.  There was also a huge overlay of depression which started after her father's death in July 16, 2011 when he died within a month of being diagnosed with lung cancer diagnosis. She sees Dr. Darleene Cleaver at [facility] who has adjusted her bipolar medications and has behavorial therapist that is coming to her home.  Of note, she has a complex headache history and sees Dr. Domingo Cocking for chronic migraines.      Review of Systems  Constitutional: Positive for fatigue. Negative for chills, activity change, appetite change and unexpected weight change.  HENT: Negative for congestion, mouth sores, sinus pressure, tinnitus and voice change.   Eyes: Negative for visual disturbance.  Respiratory: Negative for cough, chest tightness and wheezing.   Gastrointestinal: Positive for nausea. Negative for abdominal pain.  Genitourinary: Negative for frequency, difficulty urinating and vaginal pain.  Musculoskeletal: Positive for arthralgias, back pain and myalgias. Negative for gait problem, neck pain and neck stiffness.  Skin: Negative for pallor and rash.  Neurological: Positive for weakness. Negative for dizziness, tremors, numbness and headaches.  Psychiatric/Behavioral: Negative for suicidal ideas, confusion and sleep disturbance. The patient is nervous/anxious.          Objective:   Physical Exam  Constitutional: She appears well-developed. No distress.  HENT:  Head: Normocephalic.  Right Ear: External ear normal.  Left Ear: External ear normal.  Nose: Nose normal.  Mouth/Throat: Oropharynx is clear and moist.  Eyes: Conjunctivae are normal. Pupils are equal, round, and reactive to light. Right eye exhibits no discharge. Left eye exhibits no discharge. No scleral icterus.  Neck: Normal range of motion. Neck supple. No JVD present. No tracheal deviation present. No thyromegaly present.  Cardiovascular: Normal rate, regular rhythm and normal heart sounds.   No murmur heard. Pulmonary/Chest: No stridor. No respiratory distress. She has no wheezes.  Abdominal: Soft. Bowel sounds are normal. She exhibits no distension and no mass. There is no tenderness. There is no rebound and no guarding.  Musculoskeletal: She exhibits no edema and no tenderness.  Lymphadenopathy:    She has no cervical adenopathy.  Neurological: She displays normal reflexes. No cranial nerve deficit. She exhibits normal muscle tone. Coordination normal.  Skin: No rash noted. She is not diaphoretic. No erythema.  Psychiatric: She has a normal mood and affect. Judgment and thought content normal.  Somewhat sedated   A complex case  Lab Results  Component Value Date   WBC 7.1 04/04/2013   HGB 13.7 04/04/2013   HCT 42.7 04/04/2013   PLT 293.0 04/04/2013   GLUCOSE 106* 04/04/2013   CHOL 145 10/31/2012   TRIG 90.0 10/31/2012   HDL 27.60* 10/31/2012   LDLCALC 99 10/31/2012   ALT 15 04/04/2013   AST 14 04/04/2013   NA 137 04/04/2013   K 3.8 04/04/2013   CL 101 04/04/2013   CREATININE 0.8 04/04/2013   BUN 15 04/04/2013   CO2 30 04/04/2013   TSH 0.35 04/22/2013   INR 1.06 11/03/2009   HGBA1C 5.8 06/16/2013   MICROALBUR 0.50 01/20/2013         Assessment & Plan:

## 2013-06-18 NOTE — Patient Instructions (Signed)
Stop Lipitor 

## 2013-06-18 NOTE — Assessment & Plan Note (Signed)
Continue with current prescription therapy as reflected on the Med list.  

## 2013-06-22 DIAGNOSIS — M797 Fibromyalgia: Secondary | ICD-10-CM | POA: Insufficient documentation

## 2013-06-22 NOTE — Assessment & Plan Note (Addendum)
4/15 A complex situation. Pt states Pain Clinic was not able to see her. Drug screen test was ordered. Contract. She can use Tramadol prn for now  Pt tells me that she was accused of malingering (2012) by Dr Jannifer Franklin and forgery of pain prescriptions that were "all wrong" and were done by her sisters that "don't like me".

## 2013-06-22 NOTE — Assessment & Plan Note (Signed)
Continue with current prescription therapy as reflected on the Med list.  

## 2013-06-26 ENCOUNTER — Other Ambulatory Visit (INDEPENDENT_AMBULATORY_CARE_PROVIDER_SITE_OTHER): Payer: Medicaid Other

## 2013-06-26 ENCOUNTER — Other Ambulatory Visit: Payer: Self-pay | Admitting: *Deleted

## 2013-06-26 DIAGNOSIS — E890 Postprocedural hypothyroidism: Secondary | ICD-10-CM

## 2013-06-26 DIAGNOSIS — E119 Type 2 diabetes mellitus without complications: Secondary | ICD-10-CM

## 2013-06-26 LAB — COMPREHENSIVE METABOLIC PANEL
ALT: 13 U/L (ref 0–35)
AST: 15 U/L (ref 0–37)
Albumin: 3.7 g/dL (ref 3.5–5.2)
Alkaline Phosphatase: 84 U/L (ref 39–117)
BUN: 18 mg/dL (ref 6–23)
CO2: 28 mEq/L (ref 19–32)
Calcium: 9.1 mg/dL (ref 8.4–10.5)
Chloride: 103 mEq/L (ref 96–112)
Creatinine, Ser: 0.7 mg/dL (ref 0.4–1.2)
GFR: 116.37 mL/min (ref >60.00)
Glucose, Bld: 114 mg/dL — ABNORMAL HIGH (ref 70–99)
Potassium: 4.1 mEq/L (ref 3.5–5.1)
Sodium: 137 mEq/L (ref 135–145)
Total Bilirubin: 0.5 mg/dL (ref 0.3–1.2)
Total Protein: 7.6 g/dL (ref 6.0–8.3)

## 2013-06-26 LAB — T4, FREE: Free T4: 1.1 ng/dL (ref 0.60–1.60)

## 2013-06-26 LAB — LIPID PANEL
Cholesterol: 149 mg/dL (ref 0–200)
HDL: 36.2 mg/dL — ABNORMAL LOW (ref >39.00)
LDL Cholesterol: 99 mg/dL (ref 0–99)
Total CHOL/HDL Ratio: 4
Triglycerides: 67 mg/dL (ref 0.0–149.0)
VLDL: 13.4 mg/dL (ref 0.0–40.0)

## 2013-06-26 LAB — HEMOGLOBIN A1C: Hgb A1c MFr Bld: 6.2 % (ref 4.6–6.5)

## 2013-06-26 LAB — TSH: TSH: 0.76 u[IU]/mL (ref 0.35–5.50)

## 2013-06-29 NOTE — Progress Notes (Signed)
Patient ID: Jade Mathis, female   DOB: 1976/02/08, 38 y.o.   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  UNFORTUNATELY THE DOCUMENTATION RELATED TO NARCOTIC ABUSE AND FORGERY OF HER PRESCRIPTIONS HAS BEEN MADE BY NUMEROUS PROVIDERS BOTH BEFORE AND AFTER THE 03/19/13 encounter with DR Allyson Sabal .  Hence filing a complaint against DR Jamaul Heist to the [facility] medical board , will probably not help set the record straight .  Patient needs to set the record straight with the practice where the problem started which was at Dr [facility] clinic in 2012 and then communicate the outcome to all other providers to change all subsequent documentation.  It's a providers duty to communicate as clearly and as effectively as possible with other providers in the network or those  seeing the patient to provide care which  in the best interest of the patient .

## 2013-07-02 ENCOUNTER — Telehealth: Payer: Self-pay | Admitting: Neurology

## 2013-07-02 ENCOUNTER — Ambulatory Visit (INDEPENDENT_AMBULATORY_CARE_PROVIDER_SITE_OTHER): Payer: Medicaid Other | Admitting: Endocrinology

## 2013-07-02 ENCOUNTER — Encounter: Payer: Self-pay | Admitting: Endocrinology

## 2013-07-02 ENCOUNTER — Ambulatory Visit: Payer: Self-pay | Admitting: Endocrinology

## 2013-07-02 VITALS — BP 118/78 | HR 69 | Temp 97.9°F | Resp 16 | Ht 66.0 in | Wt 204.0 lb

## 2013-07-02 DIAGNOSIS — E890 Postprocedural hypothyroidism: Secondary | ICD-10-CM

## 2013-07-02 DIAGNOSIS — E119 Type 2 diabetes mellitus without complications: Secondary | ICD-10-CM

## 2013-07-02 DIAGNOSIS — E7800 Pure hypercholesterolemia, unspecified: Secondary | ICD-10-CM

## 2013-07-02 NOTE — Patient Instructions (Addendum)
Please check blood sugars at least half the time about 2 hours after any meal and as directed on waking up.   Please bring blood sugar monitor to each visit  

## 2013-07-02 NOTE — Telephone Encounter (Signed)
Pt called back stated that no one wanted to help her and that she was advising [place] that she has the civil rights involved and that she needs Dr. Jannifer Franklin to call her concerning this matter. Pt also states that Dr. Jannifer Franklin knows about this and that she doesn't want an apt she just wants him to return her call or she will be having the Civil Rights here to find out why he won't speak with her. I did advise of the previous call concerning pt was dismissed and was referred to another Dr. And that after 3 years all pt's have to get a new referral. Pt stated that is not what she is wanting to talk about to me and that I didn't understand her. Pt kept on advising me concerning Civil Rights and I advised her I would send this message to Dr. Jannifer Franklin have a nice day and good bye. Pt wanted to continue arguing about none of the girls answering the phones were helping her out. Thanks

## 2013-07-02 NOTE — Telephone Encounter (Signed)
Dr. Jannifer Franklin please see messages, she was dismissed in March 2012 from [facility].
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
## 2013-07-02 NOTE — Telephone Encounter (Signed)
I picked up this call from being on hold. Patient was very rude and demanding to speak to Dr. Jannifer Franklin or she was going to call " civil rights" if someone didn't get him right now. I asked her if there was something I could help her with she said to put her back on hold and let the person that put her on hold pick the call back up. I advised her that everyone was busy with patients and calls and I would be happy to help her. She keep repeating that she would call civil rights and yelling into the phone. I disconnected the call.

## 2013-07-02 NOTE — Telephone Encounter (Signed)
Closing encounter

## 2013-07-02 NOTE — Telephone Encounter (Signed)
This patient was discharged from our practice in March of 2012, I did not call the patient back. We made arrangements for her to be seen through [facility]. Also, this patient reported me to the [facility]. Her complaint was not considered valid by the board, a letter was sent to the patient regarding this.

## 2013-07-07 ENCOUNTER — Telehealth: Payer: Self-pay

## 2013-07-07 NOTE — Telephone Encounter (Signed)
Relevant patient education assigned to patient using Emmi. ° °

## 2013-07-18 ENCOUNTER — Encounter: Payer: Self-pay | Admitting: Internal Medicine

## 2013-07-18 ENCOUNTER — Ambulatory Visit (INDEPENDENT_AMBULATORY_CARE_PROVIDER_SITE_OTHER): Payer: Medicaid Other | Admitting: Internal Medicine

## 2013-07-18 ENCOUNTER — Inpatient Hospital Stay (HOSPITAL_COMMUNITY)
Admission: AD | Admit: 2013-07-18 | Discharge: 2013-07-19 | Disposition: A | Discharge status: Home / Self Care | Payer: Medicaid Other | Source: Ambulatory Visit | Attending: Obstetrics and Gynecology | Admitting: Obstetrics and Gynecology

## 2013-07-18 ENCOUNTER — Encounter (HOSPITAL_COMMUNITY): Payer: Self-pay | Admitting: *Deleted

## 2013-07-18 VITALS — BP 110/72 | HR 72 | Temp 98.3°F | Resp 16 | Wt 204.0 lb

## 2013-07-18 DIAGNOSIS — R159 Full incontinence of feces: Secondary | ICD-10-CM

## 2013-07-18 DIAGNOSIS — M79604 Pain in right leg: Secondary | ICD-10-CM | POA: Insufficient documentation

## 2013-07-18 DIAGNOSIS — M79671 Pain in right foot: Secondary | ICD-10-CM | POA: Insufficient documentation

## 2013-07-18 DIAGNOSIS — G43909 Migraine, unspecified, not intractable, without status migrainosus: Secondary | ICD-10-CM

## 2013-07-18 DIAGNOSIS — G894 Chronic pain syndrome: Secondary | ICD-10-CM

## 2013-07-18 DIAGNOSIS — J45909 Unspecified asthma, uncomplicated: Secondary | ICD-10-CM

## 2013-07-18 DIAGNOSIS — M79609 Pain in unspecified limb: Secondary | ICD-10-CM

## 2013-07-18 DIAGNOSIS — K219 Gastro-esophageal reflux disease without esophagitis: Secondary | ICD-10-CM | POA: Insufficient documentation

## 2013-07-18 DIAGNOSIS — R32 Unspecified urinary incontinence: Secondary | ICD-10-CM | POA: Insufficient documentation

## 2013-07-18 DIAGNOSIS — R109 Unspecified abdominal pain: Secondary | ICD-10-CM | POA: Insufficient documentation

## 2013-07-18 DIAGNOSIS — M79672 Pain in left foot: Secondary | ICD-10-CM

## 2013-07-18 DIAGNOSIS — Z30431 Encounter for routine checking of intrauterine contraceptive device: Secondary | ICD-10-CM | POA: Insufficient documentation

## 2013-07-18 LAB — URINALYSIS, ROUTINE W REFLEX MICROSCOPIC
Bilirubin Urine: NEGATIVE
Glucose, UA: >1000 mg/dL — AB
Hgb urine dipstick: NEGATIVE
Ketones, ur: NEGATIVE mg/dL
Leukocytes, UA: NEGATIVE
Nitrite: NEGATIVE
Protein, ur: NEGATIVE mg/dL
Specific Gravity, Urine: 1.01 (ref 1.005–1.030)
Urobilinogen, UA: 0.2 mg/dL (ref 0.0–1.0)
pH: 6.5 (ref 5.0–8.0)

## 2013-07-18 LAB — POCT PREGNANCY, URINE: Preg Test, Ur: NEGATIVE

## 2013-07-18 LAB — URINE MICROSCOPIC-ADD ON

## 2013-07-18 MED ORDER — ACETAMINOPHEN 325 MG PO TABS
650.0000 mg | ORAL_TABLET | Freq: Once | ORAL | Status: AC
  Administered 2013-07-19: 650 mg via ORAL
  Filled 2013-07-18: qty 2

## 2013-07-18 MED ORDER — ONDANSETRON HCL 4 MG PO TABS: 4.0000 mg | ORAL_TABLET | Freq: Three times a day (TID) | ORAL | Status: DC | PRN

## 2013-07-18 NOTE — Progress Notes (Signed)
Pre visit review using our clinic review tool, if applicable. No additional management support is needed unless otherwise documented below in the visit note. 
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
## 2013-07-18 NOTE — Assessment & Plan Note (Signed)
Continue with current prescription therapy as reflected on the Med list.  

## 2013-07-18 NOTE — MAU Note (Signed)
My lower stomach has been hurting since this morning. Was seen at [facility] this am and told them and told me maybe i am taking too many Zofran. My sides hurt too. Denies vag bleeding or d/c. I have an IUD

## 2013-07-18 NOTE — Assessment & Plan Note (Signed)
Flat feet B Podiatry ref

## 2013-07-18 NOTE — Assessment & Plan Note (Signed)
Zofran prn

## 2013-07-18 NOTE — Assessment & Plan Note (Signed)
c/o "pooping om myself - I can't feel it when I need to go..." x months. C/o nausea, needs meds." I'm nauseated from PTSD, I nee Zofran..."  ?psychological vs other GI consult

## 2013-07-18 NOTE — Assessment & Plan Note (Signed)
4/15 A complex situation. She started to go to a Pain Clinic now - [facility] [address]

## 2013-07-18 NOTE — Progress Notes (Signed)
Subjective:     HPI  Pt is c/o "pooping on myself - I can't feel it when I need to go..." x months. C/o nausea, needs meds." I'm nauseated from PTSD, I nee Zofran..." She started to go to a Pain Clinic now - [facility] on [address]  Pt tells me that she was accused of malingering 2010-07-01) by Dr Jannifer Franklin and forgery of pain prescriptions that were "all wrong" and were done by her sisters that "don't like me". She is asking for pain meds.  Jade Mathis is a 38 y.o. 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 Patient is accompanied by her husband. She was last seen in the clinic on 01/28/2013.  He complains of whole body pain, headaches, nausea, abdominal, chest pain and has been having a difficult time trying to establish care with pain management because she tells me that malingering has been documented in her chart, but it is not true.  Regarding her myasthenia, there has been no new worsening of symptoms though she feels tired all the time because of her pain. Denies any ptosis, dysphagia, dysarthria, or generalized weakness.  History of present illness:  She was diagnosed with myasthenia gravis in 07-01-1995 at which time symptoms were manifested with systemic weakness, double vision, ptosis, and problems with swallowing and speaking. Diagnosis was made based on repetitive nerve stimulation and blood tests, however we do not have these records. 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 Around the same time, prednisone and mestinon was initiated. At some point, she was started on azathioprine. Imuran was stopped in 01-Jul-2002  because of an unplanned pregnancy and she remained on prednisone 20mg  until June 30, 2009 when she had an exacerbation requiring out-patient PLEX. In 06/30/09, Imuran was restarted (dose unknown). She been on prednisone since 01-Jul-1995 and the lowest dose is her current dose at 10mg  daily. Cellcept was tried but she developed a rash so it was stopped.  The patient has previously been evaluated at [facility] by Dr. Truman Hayward 07-01-10), discharged from [facility], and then established care with Dr. Jacelyn Grip at [facility] in Jul 01, 2011. Most recently (12/2011 - 07/2012), she was evaluated by Dr. Jannifer Franklin at [facility] in [facility].  When she initially saw Dr. Jacelyn Grip in 07-01-2011, she was only on prednisone 10mg  and mestinon 60mg  QID and demonstrated clinical signs of weakness, therefore Imuran was restarted.  There was also a huge overlay of depression which started after her father's death in July 01, 2011 when he died within a month of being diagnosed with lung cancer diagnosis. She sees Dr. Darleene Cleaver at [place] who has adjusted her bipolar medications and has behavorial therapist that is coming to her home.  Of note, she has a complex headache history and sees Dr. Domingo Cocking for chronic migraines.      Review of Systems  Constitutional: Positive for fatigue. Negative for chills, activity change, appetite change and unexpected weight change.  HENT: Negative for congestion, mouth sores, sinus pressure, tinnitus and voice change.   Eyes: Negative for visual disturbance.  Respiratory: Negative for cough, chest tightness and wheezing.   Gastrointestinal: Positive for nausea. Negative for abdominal pain.  Genitourinary: Negative for frequency, difficulty urinating and vaginal pain.  Musculoskeletal: Positive for  arthralgias, back pain and myalgias. Negative for gait problem, neck pain and neck stiffness.  Skin: Negative for pallor and rash.  Neurological: Positive for weakness. Negative  for dizziness, tremors, numbness and headaches.  Psychiatric/Behavioral: Negative for suicidal ideas, confusion and sleep disturbance. The patient is nervous/anxious.        Objective:   Physical Exam  Constitutional: She appears well-developed. No distress.  HENT:  Head: Normocephalic.  Right Ear: External ear normal.  Left Ear: External ear normal.  Nose: Nose normal.  Mouth/Throat: Oropharynx is clear and moist.  Eyes: Conjunctivae are normal. Pupils are equal, round, and reactive to light. Right eye exhibits no discharge. Left eye exhibits no discharge. No scleral icterus.  Neck: Normal range of motion. Neck supple. No JVD present. No tracheal deviation present. No thyromegaly present.  Cardiovascular: Normal rate, regular rhythm and normal heart sounds.   No murmur heard. Pulmonary/Chest: No stridor. No respiratory distress. She has no wheezes.  Abdominal: Soft. Bowel sounds are normal. She exhibits no distension and no mass. There is no tenderness. There is no rebound and no guarding.  Musculoskeletal: She exhibits no edema and no tenderness.  Lymphadenopathy:    She has no cervical adenopathy.  Neurological: She displays normal reflexes. No cranial nerve deficit. She exhibits normal muscle tone. Coordination normal.  Skin: No rash noted. She is not diaphoretic. No erythema.  Psychiatric: She has a normal mood and affect. Judgment and thought content normal.  Somewhat sedated   A complex case   Lab Results  Component Value Date   WBC 7.1 04/04/2013   HGB 13.7 04/04/2013   HCT 42.7 04/04/2013   PLT 293.0 04/04/2013   GLUCOSE 114* 06/26/2013   CHOL 149 06/26/2013   TRIG 67.0 06/26/2013   HDL 36.20* 06/26/2013   LDLCALC 99 06/26/2013   ALT 13 06/26/2013   AST 15 06/26/2013   NA 137 06/26/2013   K 4.1 06/26/2013   CL 103 06/26/2013   CREATININE 0.7 06/26/2013   BUN 18 06/26/2013   CO2 28 06/26/2013   TSH 0.76 06/26/2013   INR 1.06 11/03/2009   HGBA1C 6.2 06/26/2013   MICROALBUR 0.50 01/20/2013          Assessment & Plan:

## 2013-07-19 ENCOUNTER — Inpatient Hospital Stay (HOSPITAL_COMMUNITY): Payer: Medicaid Other

## 2013-07-19 ENCOUNTER — Telehealth (HOSPITAL_COMMUNITY): Payer: Self-pay | Admitting: Obstetrics and Gynecology

## 2013-07-19 DIAGNOSIS — N390 Urinary tract infection, site not specified: Secondary | ICD-10-CM

## 2013-07-19 LAB — RAPID URINE DRUG SCREEN, HOSP PERFORMED
Amphetamines: NOT DETECTED
Barbiturates: NOT DETECTED
Benzodiazepines: NOT DETECTED
Cocaine: NOT DETECTED
Opiates: NOT DETECTED
Tetrahydrocannabinol: NOT DETECTED

## 2013-07-19 MED ORDER — ACETAMINOPHEN-CODEINE #3 300-30 MG PO TABS: 1.0000 | ORAL_TABLET | ORAL | Status: DC | PRN

## 2013-07-19 MED ORDER — NITROFURANTOIN MONOHYD MACRO 100 MG PO CAPS: 100.0000 mg | ORAL_CAPSULE | Freq: Two times a day (BID) | ORAL | Status: DC

## 2013-07-19 MED ORDER — PHENAZOPYRIDINE HCL 100 MG PO TABS: 100.0000 mg | ORAL_TABLET | Freq: Three times a day (TID) | ORAL | Status: DC | PRN

## 2013-07-19 NOTE — Telephone Encounter (Signed)
TC from patient--seen last evening at MAU, with c/o UTI sx and pain with urination.  Had negative renal US, with UA negative for pregnancy, few bacteria.  Hx of chronic pain, previously followed at [facility].  Declined pelvic last night with CNM on call, and was dissatisfied with care.  Rx'd Pyridium from visit last night.  Long discussion with patient regarding issues. Rx Macrobid BID x 7 days Urine culture ordered (urine still in lab from last night). Rx Tylenol #3, #30, no refills.  Patient will pick up written Rx at [facility]. Instructed patient to call office on Monday if no improvement.  Donnel Saxon, CNM 07/19/13

## 2013-07-19 NOTE — Discharge Instructions (Signed)
Back Pain, Adult Back pain is very common. The pain often gets better over time. The cause of back pain is usually not dangerous. Most people can learn to manage their back pain on their own.  HOME CARE   Stay active. Start with short walks on flat ground if you can. Try to walk farther each day.  Do not sit, drive, or stand in one place for more than 30 minutes. Do not stay in bed.  Do not avoid exercise or work. Activity can help your back heal faster.  Be careful when you bend or lift an object. Bend at your knees, keep the object close to you, and do not twist.  Sleep on a firm mattress. Lie on your side, and bend your knees. If you lie on your back, put a pillow under your knees.  Only take medicines as told by your doctor.  Put ice on the injured area.  Put ice in a plastic bag.  Place a towel between your skin and the bag.  Leave the ice on for 15-20 minutes, 03-04 times a day for the first 2 to 3 days. After that, you can switch between ice and heat packs.  Ask your doctor about back exercises or massage.  Avoid feeling anxious or stressed. Find good ways to deal with stress, such as exercise. GET HELP RIGHT AWAY IF:   Your pain does not go away with rest or medicine.  Your pain does not go away in 1 week.  You have new problems.  You do not feel well.  The pain spreads into your legs.  You cannot control when you poop (bowel movement) or pee (urinate).  Your arms or legs feel weak or lose feeling (numbness).  You feel sick to your stomach (nauseous) or throw up (vomit).  You have belly (abdominal) pain.  You feel like you may pass out (faint). MAKE SURE YOU:   Understand these instructions.  Will watch your condition.  Will get help right away if you are not doing well or get worse. Document Released: 08/23/2007 Document Revised: 05/29/2011 Document Reviewed: 07/25/2010 [facility] Patient Information 2014 [place]. Abdominal Pain,  Women Abdominal (stomach, pelvic, or belly) pain can be caused by many things. It is important to tell your doctor:  The location of the pain.  Does it come and go or is it present all the time?  Are there things that start the pain (eating certain foods, exercise)?  Are there other symptoms associated with the pain (fever, nausea, vomiting, diarrhea)? All of this is helpful to know when trying to find the cause of the pain. CAUSES   Stomach: virus or bacteria infection, or ulcer.  Intestine: appendicitis (inflamed appendix), regional ileitis (Crohn's disease), ulcerative colitis (inflamed colon), irritable bowel syndrome, diverticulitis (inflamed diverticulum of the colon), or cancer of the stomach or intestine.  Gallbladder disease or stones in the gallbladder.  Kidney disease, kidney stones, or infection.  Pancreas infection or cancer.  Fibromyalgia (pain disorder).  Diseases of the female organs:  Uterus: fibroid (non-cancerous) tumors or infection.  Fallopian tubes: infection or tubal pregnancy.  Ovary: cysts or tumors.  Pelvic adhesions (scar tissue).  Endometriosis (uterus lining tissue growing in the pelvis and on the pelvic organs).  Pelvic congestion syndrome (female organs filling up with blood just before the menstrual period).  Pain with the menstrual period.  Pain with ovulation (producing an egg).  Pain with an IUD (intrauterine device, birth control) in the uterus.  Cancer  of the female organs.  Functional pain (pain not caused by a disease, may improve without treatment).  Psychological pain.  Depression. DIAGNOSIS  Your doctor will decide the seriousness of your pain by doing an examination.  Blood tests.  X-rays.  Ultrasound.  CT scan (computed tomography, special type of X-ray).  MRI (magnetic resonance imaging).  Cultures, for infection.  Barium enema (dye inserted in the large intestine, to better view it with  X-rays).  Colonoscopy (looking in intestine with a lighted tube).  Laparoscopy (minor surgery, looking in abdomen with a lighted tube).  Major abdominal exploratory surgery (looking in abdomen with a large incision). TREATMENT  The treatment will depend on the cause of the pain.   Many cases can be observed and treated at home.  Over-the-counter medicines recommended by your caregiver.  Prescription medicine.  Antibiotics, for infection.  Birth control pills, for painful periods or for ovulation pain.  Hormone treatment, for endometriosis.  Nerve blocking injections.  Physical therapy.  Antidepressants.  Counseling with a psychologist or psychiatrist.  Minor or major surgery. HOME CARE INSTRUCTIONS   Do not take laxatives, unless directed by your caregiver.  Take over-the-counter pain medicine only if ordered by your caregiver. Do not take aspirin because it can cause an upset stomach or bleeding.  Try a clear liquid diet (broth or water) as ordered by your caregiver. Slowly move to a bland diet, as tolerated, if the pain is related to the stomach or intestine.  Have a thermometer and take your temperature several times a day, and record it.  Bed rest and sleep, if it helps the pain.  Avoid sexual intercourse, if it causes pain.  Avoid stressful situations.  Keep your follow-up appointments and tests, as your caregiver orders.  If the pain does not go away with medicine or surgery, you may try:  Acupuncture.  Relaxation exercises (yoga, meditation).  Group therapy.  Counseling. SEEK MEDICAL CARE IF:   You notice certain foods cause stomach pain.  Your home care treatment is not helping your pain.  You need stronger pain medicine.  You want your IUD removed.  You feel faint or lightheaded.  You develop nausea and vomiting.  You develop a rash.  You are having side effects or an allergy to your medicine. SEEK IMMEDIATE MEDICAL CARE IF:   Your  pain does not go away or gets worse.  You have a fever.  Your pain is felt only in portions of the abdomen. The right side could possibly be appendicitis. The left lower portion of the abdomen could be colitis or diverticulitis.  You are passing blood in your stools (bright red or black tarry stools, with or without vomiting).  You have blood in your urine.  You develop chills, with or without a fever.  You pass out. MAKE SURE YOU:   Understand these instructions.  Will watch your condition.  Will get help right away if you are not doing well or get worse. Document Released: 01/01/2007 Document Revised: 05/29/2011 Document Reviewed: 01/21/2009 [facility] Patient Information 2014 [place], [place].

## 2013-07-21 LAB — URINE CULTURE: Colony Count: 5000

## 2013-07-22 ENCOUNTER — Encounter: Payer: Self-pay | Admitting: Gastroenterology

## 2013-07-22 ENCOUNTER — Encounter: Payer: Self-pay | Admitting: Internal Medicine

## 2013-07-22 ENCOUNTER — Ambulatory Visit (INDEPENDENT_AMBULATORY_CARE_PROVIDER_SITE_OTHER): Payer: Medicaid Other | Admitting: Gastroenterology

## 2013-07-22 VITALS — BP 140/82 | HR 66 | Ht 66.0 in | Wt 203.8 lb

## 2013-07-22 DIAGNOSIS — R197 Diarrhea, unspecified: Secondary | ICD-10-CM

## 2013-07-22 DIAGNOSIS — R110 Nausea: Secondary | ICD-10-CM

## 2013-07-22 MED ORDER — MOVIPREP 100 G PO SOLR: 1.0000 | Freq: Once | ORAL | Status: DC
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
## 2013-07-22 NOTE — Patient Instructions (Signed)
You will be set up for a colonoscopy (for loose stools) You will be set up for an upper endoscopy (for nausea, weight loss). Both of these should be at [facility] with MAC sedation.

## 2013-07-25 ENCOUNTER — Encounter (HOSPITAL_COMMUNITY): Payer: Self-pay | Admitting: Pharmacy Technician

## 2013-07-30 ENCOUNTER — Telehealth: Payer: Self-pay | Admitting: *Deleted

## 2013-07-30 ENCOUNTER — Other Ambulatory Visit: Payer: Self-pay | Admitting: *Deleted

## 2013-07-30 MED ORDER — LEVOTHYROXINE SODIUM 175 MCG PO TABS: ORAL_TABLET | ORAL | Status: DC

## 2013-07-31 ENCOUNTER — Other Ambulatory Visit: Payer: Self-pay | Admitting: Obstetrics and Gynecology

## 2013-07-31 DIAGNOSIS — N644 Mastodynia: Secondary | ICD-10-CM

## 2013-08-01 ENCOUNTER — Encounter (HOSPITAL_COMMUNITY): Payer: Self-pay | Admitting: *Deleted

## 2013-08-06 ENCOUNTER — Ambulatory Visit
Admission: RE | Admit: 2013-08-06 | Discharge: 2013-08-06 | Disposition: A | Discharge status: Home / Self Care | Payer: Medicare Other | Source: Ambulatory Visit | Attending: Obstetrics and Gynecology | Admitting: Obstetrics and Gynecology

## 2013-08-06 ENCOUNTER — Ambulatory Visit
Admission: RE | Admit: 2013-08-06 | Discharge: 2013-08-06 | Disposition: A | Discharge status: Home / Self Care | Payer: Medicaid Other | Source: Ambulatory Visit | Attending: Obstetrics and Gynecology | Admitting: Obstetrics and Gynecology

## 2013-08-06 ENCOUNTER — Encounter (INDEPENDENT_AMBULATORY_CARE_PROVIDER_SITE_OTHER): Payer: Self-pay

## 2013-08-06 DIAGNOSIS — N644 Mastodynia: Secondary | ICD-10-CM

## 2013-08-07 ENCOUNTER — Ambulatory Visit (HOSPITAL_COMMUNITY): Admit: 2013-08-07 | Payer: Self-pay | Admitting: Gastroenterology

## 2013-08-07 ENCOUNTER — Encounter (HOSPITAL_COMMUNITY): Payer: Self-pay | Admitting: Gastroenterology

## 2013-08-07 ENCOUNTER — Encounter (HOSPITAL_COMMUNITY): Payer: Medicaid Other | Admitting: Anesthesiology

## 2013-08-07 ENCOUNTER — Ambulatory Visit (HOSPITAL_COMMUNITY): Payer: Medicaid Other | Admitting: Anesthesiology

## 2013-08-07 ENCOUNTER — Encounter (HOSPITAL_COMMUNITY)
Admission: RE | Disposition: A | Discharge status: Home / Self Care | Payer: Self-pay | Source: Ambulatory Visit | Attending: Gastroenterology

## 2013-08-07 ENCOUNTER — Ambulatory Visit (HOSPITAL_COMMUNITY)
Admission: RE | Admit: 2013-08-07 | Discharge: 2013-08-07 | Disposition: A | Discharge status: Home / Self Care | Payer: Medicaid Other | Source: Ambulatory Visit | Attending: Gastroenterology | Admitting: Gastroenterology

## 2013-08-07 ENCOUNTER — Telehealth: Payer: Self-pay | Admitting: Gastroenterology

## 2013-08-07 DIAGNOSIS — J45909 Unspecified asthma, uncomplicated: Secondary | ICD-10-CM | POA: Insufficient documentation

## 2013-08-07 DIAGNOSIS — I10 Essential (primary) hypertension: Secondary | ICD-10-CM | POA: Insufficient documentation

## 2013-08-07 DIAGNOSIS — R110 Nausea: Secondary | ICD-10-CM | POA: Insufficient documentation

## 2013-08-07 DIAGNOSIS — E119 Type 2 diabetes mellitus without complications: Secondary | ICD-10-CM | POA: Insufficient documentation

## 2013-08-07 DIAGNOSIS — Z79899 Other long term (current) drug therapy: Secondary | ICD-10-CM | POA: Insufficient documentation

## 2013-08-07 DIAGNOSIS — R197 Diarrhea, unspecified: Secondary | ICD-10-CM

## 2013-08-07 DIAGNOSIS — R634 Abnormal weight loss: Secondary | ICD-10-CM | POA: Insufficient documentation

## 2013-08-07 DIAGNOSIS — IMO0001 Reserved for inherently not codable concepts without codable children: Secondary | ICD-10-CM | POA: Insufficient documentation

## 2013-08-07 DIAGNOSIS — G4730 Sleep apnea, unspecified: Secondary | ICD-10-CM | POA: Insufficient documentation

## 2013-08-07 DIAGNOSIS — IMO0002 Reserved for concepts with insufficient information to code with codable children: Secondary | ICD-10-CM | POA: Insufficient documentation

## 2013-08-07 DIAGNOSIS — G7000 Myasthenia gravis without (acute) exacerbation: Secondary | ICD-10-CM | POA: Insufficient documentation

## 2013-08-07 DIAGNOSIS — F319 Bipolar disorder, unspecified: Secondary | ICD-10-CM | POA: Insufficient documentation

## 2013-08-07 HISTORY — DX: Family history of other specified conditions: Z84.89

## 2013-08-07 HISTORY — PX: COLONOSCOPY WITH PROPOFOL: SHX5780

## 2013-08-07 HISTORY — PX: ESOPHAGOGASTRODUODENOSCOPY: SHX5428

## 2013-08-07 HISTORY — DX: Sleep apnea, unspecified: G47.30

## 2013-08-07 SURGERY — COLONOSCOPY WITH PROPOFOL
Anesthesia: Monitor Anesthesia Care

## 2013-08-07 SURGERY — EGD (ESOPHAGOGASTRODUODENOSCOPY)
Anesthesia: Monitor Anesthesia Care

## 2013-08-07 MED ORDER — ONDANSETRON HCL 4 MG/2ML IJ SOLN
INTRAMUSCULAR | Status: DC | PRN
  Administered 2013-08-07 (×2): 2 mg via INTRAVENOUS

## 2013-08-07 MED ORDER — ONDANSETRON HCL 4 MG PO TABS: 4.0000 mg | ORAL_TABLET | Freq: Two times a day (BID) | ORAL | Status: DC

## 2013-08-07 MED ORDER — PROPOFOL INFUSION 10 MG/ML OPTIME
INTRAVENOUS | Status: DC | PRN
  Administered 2013-08-07: 25 ug/kg/min via INTRAVENOUS

## 2013-08-07 MED ORDER — MIDAZOLAM HCL 2 MG/2ML IJ SOLN
INTRAMUSCULAR | Status: AC
  Filled 2013-08-07: qty 2

## 2013-08-07 MED ORDER — KETAMINE HCL 10 MG/ML IJ SOLN
INTRAMUSCULAR | Status: DC | PRN
  Administered 2013-08-07 (×4): 10 mg via INTRAVENOUS

## 2013-08-07 MED ORDER — MIDAZOLAM HCL 5 MG/5ML IJ SOLN
INTRAMUSCULAR | Status: DC | PRN
  Administered 2013-08-07: 0.5 mg via INTRAVENOUS
  Administered 2013-08-07: 1.5 mg via INTRAVENOUS

## 2013-08-07 MED ORDER — PROPOFOL 10 MG/ML IV BOLUS
INTRAVENOUS | Status: AC
  Filled 2013-08-07: qty 20

## 2013-08-07 MED ORDER — FENTANYL CITRATE 0.05 MG/ML IJ SOLN
INTRAMUSCULAR | Status: AC
  Filled 2013-08-07: qty 2

## 2013-08-07 MED ORDER — SODIUM CHLORIDE 0.9 % IV SOLN: INTRAVENOUS | Status: DC

## 2013-08-07 MED ORDER — LACTATED RINGERS IV SOLN
INTRAVENOUS | Status: DC | PRN
  Administered 2013-08-07: 10:00:00 via INTRAVENOUS

## 2013-08-07 MED ORDER — FENTANYL CITRATE 0.05 MG/ML IJ SOLN
INTRAMUSCULAR | Status: DC | PRN
  Administered 2013-08-07: 25 ug via INTRAVENOUS

## 2013-08-07 SURGICAL SUPPLY — 21 items

## 2013-08-07 NOTE — Telephone Encounter (Signed)
She left a message with [facility] endo that she was cancelling her procedures today.  No reason why.  Can you get in touch with her and ask if she would like to reschedule.

## 2013-08-07 NOTE — Transfer of Care (Signed)
Immediate Anesthesia Transfer of Care Note  Patient: Jade Mathis  Procedure(s) Performed: Procedure(s): ESOPHAGOGASTRODUODENOSCOPY (EGD) (N/A) COLONOSCOPY WITH PROPOFOL (N/A)  Patient Location: PACU  Anesthesia Type:MAC  Level of Consciousness: awake, alert  and patient cooperative  Airway & Oxygen Therapy: Patient Spontanous Breathing and Patient connected to nasal cannula oxygen  Post-op Assessment: Report given to PACU RN and Post -op Vital signs reviewed and stable  Post vital signs: stable  Complications: No apparent anesthesia complications

## 2013-08-07 NOTE — Interval H&P Note (Signed)
History and Physical Interval Note:  08/07/2013 9:31 AM  Jade Mathis  has presented today for surgery, with the diagnosis of diarrhea, wt loss  The various methods of treatment have been discussed with the patient and family. After consideration of risks, benefits and other options for treatment, the patient has consented to  Procedure(s): ESOPHAGOGASTRODUODENOSCOPY (EGD) (N/A) COLONOSCOPY WITH PROPOFOL (N/A) as a surgical intervention .  The patient's history has been reviewed, patient examined, no change in status, stable for surgery.  I have reviewed the patient's chart and labs.  Questions were answered to the patient's satisfaction.     Milus Banister

## 2013-08-07 NOTE — Anesthesia Preprocedure Evaluation (Signed)
Anesthesia Evaluation  Patient identified by MRN, date of birth, ID band Patient awake    Reviewed: Allergy & Precautions, H&P , NPO status , Patient's Chart, lab work & pertinent test results  Airway Mallampati: II TM Distance: >3 FB Neck ROM: Full    Dental no notable dental hx.    Pulmonary asthma , sleep apnea ,  breath sounds clear to auscultation  Pulmonary exam normal       Cardiovascular hypertension, Pt. on medications Rhythm:Regular Rate:Normal     Neuro/Psych Bipolar Disorder  Neuromuscular disease (myesthenia gravis) negative neurological ROS     GI/Hepatic negative GI ROS, Neg liver ROS,   Endo/Other  diabetes, Type 2, Oral Hypoglycemic Agents  Renal/GU negative Renal ROS  negative genitourinary   Musculoskeletal  (+) Fibromyalgia -  Abdominal   Peds negative pediatric ROS (+)  Hematology negative hematology ROS (+)   Anesthesia Other Findings   Reproductive/Obstetrics negative OB ROS                           Anesthesia Physical Anesthesia Plan  ASA: III  Anesthesia Plan: General and MAC   Post-op Pain Management:    Induction:   Airway Management Planned: Natural Airway  Additional Equipment:   Intra-op Plan:   Post-operative Plan:   Informed Consent: I have reviewed the patients History and Physical, chart, labs and discussed the procedure including the risks, benefits and alternatives for the proposed anesthesia with the patient or authorized representative who has indicated his/her understanding and acceptance.   Dental advisory given  Plan Discussed with: CRNA  Anesthesia Plan Comments:         Anesthesia Quick Evaluation

## 2013-08-07 NOTE — Discharge Instructions (Signed)

## 2013-08-07 NOTE — Anesthesia Postprocedure Evaluation (Signed)
  Anesthesia Post-op Note  Patient: Jade Mathis  Procedure(s) Performed: Procedure(s) (LRB): ESOPHAGOGASTRODUODENOSCOPY (EGD) (N/A) COLONOSCOPY WITH PROPOFOL (N/A)  Patient Location: PACU  Anesthesia Type: MAC  Level of Consciousness: awake and alert   Airway and Oxygen Therapy: Patient Spontanous Breathing  Post-op Pain: mild  Post-op Assessment: Post-op Vital signs reviewed, Patient's Cardiovascular Status Stable, Respiratory Function Stable, Patent Airway and No signs of Nausea or vomiting  Last Vitals:  Filed Vitals:   08/07/13 1121  BP: 105/57  Pulse: 75  Temp:   Resp: 16    Post-op Vital Signs: stable   Complications: No apparent anesthesia complications

## 2013-08-07 NOTE — Op Note (Signed)
[facility] [place], [id number]   ENDOSCOPY PROCEDURE REPORT  PATIENT: Jade Mathis, [place]  MR#: [id number] BIRTHDATE: Jun 05, 1975 , 37  yrs. old GENDER: Female ENDOSCOPIST: Milus Banister, MD PROCEDURE DATE:  08/07/2013 PROCEDURE:  EGD, diagnostic ASA CLASS:     Class III INDICATIONS:  chronic nausea. MEDICATIONS: MAC sedation, administered by CRNA TOPICAL ANESTHETIC: none  DESCRIPTION OF PROCEDURE: After the risks benefits and alternatives of the procedure were thoroughly explained, informed consent was obtained.  The [place] [id number] endoscope was introduced through the mouth and advanced to the second portion of the duodenum. Without limitations.  The instrument was slowly withdrawn as the mucosa was fully examined.   The upper, middle and distal third of the esophagus were carefully inspected and no abnormalities were noted.  The z-line was well seen at the GEJ.  The endoscope was pushed into the fundus which was normal including a retroflexed view.  The antrum, gastric body, first and second part of the duodenum were unremarkable. Retroflexed views revealed no abnormalities.     The scope was then withdrawn from the patient and the procedure completed. COMPLICATIONS: There were no complications.  ENDOSCOPIC IMPRESSION: Normal EGD  RECOMMENDATIONS: Nausea is the #1 side effect of pyridium, potassium, tramadol, zomig and prozac.  Nausea also well known from metform, advil and narcotic pain medicines.  I suspect these medicines are causing or at least contributing to your nausea. Please start taking twice daily zofran (new prescription was called in today).   eSigned:  Milus Banister, MD 08/07/2013 10:56 AM   CC: Walker Kehr, MD

## 2013-08-07 NOTE — Op Note (Signed)
[facility] [place], [address]   COLONOSCOPY PROCEDURE REPORT  PATIENT: Jade Mathis, Jade Mathis  MR#: [id number] BIRTHDATE: 04-Nov-1975 , 37  yrs. old GENDER: Female ENDOSCOPIST: Milus Banister, MD REFERRED KX:FGHW Avel Sensor, M.D. PROCEDURE DATE:  08/07/2013 PROCEDURE:   Colonoscopy with biopsy First Screening Colonoscopy - Avg.  risk and is 50 yrs.  old or older - No.  Prior Negative Screening - Now for repeat screening. N/A  History of Adenoma - Now for follow-up colonoscopy & has been > or = to 3 yrs.  N/A  Polyps Removed Today? No.  Recommend repeat exam, <10 yrs? No. ASA CLASS:   Class III INDICATIONS:diarrhrea. MEDICATIONS: MAC sedation, administered by CRNA  DESCRIPTION OF PROCEDURE:   After the risks benefits and alternatives of the procedure were thoroughly explained, informed consent was obtained.  A digital rectal exam revealed no abnormalities of the rectum.   The Pentax Colonoscope [id number] endoscope was introduced through the anus and advanced to the terminal ileum which was intubated for a short distance. No adverse events experienced.   The quality of the prep was fair.  The instrument was then slowly withdrawn as the colon was fully examined.   COLON FINDINGS: The mucosa appeared normal in the terminal ileum. A normal appearing cecum, ileocecal valve, and appendiceal orifice were identified.  The ascending, hepatic flexure, transverse, splenic flexure, descending, sigmoid colon and rectum appeared unremarkable.  No polyps or cancers were seen.  Retroflexed views revealed no abnormalities. The colon was randomly biopsied and sent to pathology. The time to cecum=3 minutes 00 seconds.  Withdrawal time=8 minutes 00 seconds.  The scope was withdrawn and the procedure completed. COMPLICATIONS: There were no complications.  ENDOSCOPIC IMPRESSION: 1.   Normal mucosa in the terminal ileum 2.   Normal colon, the mucosa was biopsied to check  for microscopic colitis.  RECOMMENDATIONS: Await final pathology.  Diarrhea, loose stools is the number 1 side effect of metformin and mestinon and the number 3 side effect of lisinopril.  Those medicinse are likely contributing to your bowel issues. Please start twice daily OTC imodium (take one pill shortly after waking and one pill in the early afternoon).  eSigned:  Milus Banister, MD 08/07/2013 10:52 AM

## 2013-08-07 NOTE — Telephone Encounter (Signed)
The pt is at the hospital now to have the procedures Dr Ardis Hughs is aware
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
## 2013-08-08 ENCOUNTER — Encounter (HOSPITAL_COMMUNITY): Payer: Self-pay | Admitting: Gastroenterology

## 2013-08-12 ENCOUNTER — Ambulatory Visit (INDEPENDENT_AMBULATORY_CARE_PROVIDER_SITE_OTHER): Payer: Medicaid Other | Admitting: Podiatry

## 2013-08-12 ENCOUNTER — Encounter: Payer: Self-pay | Admitting: Podiatry

## 2013-08-12 ENCOUNTER — Ambulatory Visit (INDEPENDENT_AMBULATORY_CARE_PROVIDER_SITE_OTHER): Payer: Medicaid Other

## 2013-08-12 VITALS — BP 113/70 | HR 68 | Resp 16

## 2013-08-12 DIAGNOSIS — E0840 Diabetes mellitus due to underlying condition with diabetic neuropathy, unspecified: Secondary | ICD-10-CM

## 2013-08-12 DIAGNOSIS — Q6650 Congenital pes planus, unspecified foot: Secondary | ICD-10-CM

## 2013-08-12 DIAGNOSIS — E1142 Type 2 diabetes mellitus with diabetic polyneuropathy: Secondary | ICD-10-CM

## 2013-08-12 DIAGNOSIS — E1369 Other specified diabetes mellitus with other specified complication: Secondary | ICD-10-CM

## 2013-08-12 DIAGNOSIS — E1365 Other specified diabetes mellitus with hyperglycemia: Secondary | ICD-10-CM

## 2013-08-12 DIAGNOSIS — IMO0002 Reserved for concepts with insufficient information to code with codable children: Secondary | ICD-10-CM

## 2013-08-12 NOTE — Progress Notes (Signed)
Both feet are numb and having pain going up the legs. Diabetes last a1c was a 6.5   Objective: Vital signs are stable she is alert and oriented x3. Pulses are palpable bilateral. Neurologic sensorium is decreased per since once the monofilament bilateral toes midfoot. Deep tendon reflexes are in non-elicitable. Muscle strength + out of 5 dorsiflexors plantar flexors inverters everters all intrinsic musculature intact orthopedic evaluation demonstrates all joints distal to the ankle a full range of motion without crepitation that she is slightly limited on dorsiflexion of the first metatarsophalangeal joint. Her feet are hypersensitive but yet have loss of sensation to toes and the plantar foot. The pain was not reproducible along the posterior aspect of the foot and the leg. She's also complaining of pain up in the thigh it radiates distally toward foot. Typically this is a radicular origin. Cutaneous evaluation demonstrates supple well hydrated cutis no erythema edema saline is drainage or odor radiographic evaluation demonstrates profound pes planus with visible soft tissue margins appear to be normal.  Assessment: Diabetic peripheral neuropathy moderate to severe nature. Pes planus bilateral. I have referred her back to her primary it is been providing her with Neurontin for an increase in control. She also relates falls and staggering that could be consistent with MS. I suggested that she followup with her neurologist who treats her for her myasthenia gravis.  Plan: Increased Neurontin 600 mg 3 times a day by primary care and followup with neurologist for falls. We did send her to physical therapy for gait training and balance training we should help. We will also treat her symptoms for neuropathy. I will followup with her as needed. We may need to consider diabetic shoes that she understands the medication portion will typically not be covered.

## 2013-08-16 ENCOUNTER — Encounter (HOSPITAL_COMMUNITY): Payer: Self-pay | Admitting: Emergency Medicine

## 2013-08-16 ENCOUNTER — Emergency Department (HOSPITAL_COMMUNITY)
Admission: EM | Admit: 2013-08-16 | Discharge: 2013-08-16 | Disposition: A | Discharge status: Home / Self Care | Payer: Medicaid Other | Attending: Emergency Medicine | Admitting: Emergency Medicine

## 2013-08-16 ENCOUNTER — Emergency Department (HOSPITAL_COMMUNITY): Payer: Medicaid Other

## 2013-08-16 DIAGNOSIS — Z792 Long term (current) use of antibiotics: Secondary | ICD-10-CM | POA: Insufficient documentation

## 2013-08-16 DIAGNOSIS — Z9089 Acquired absence of other organs: Secondary | ICD-10-CM | POA: Insufficient documentation

## 2013-08-16 DIAGNOSIS — J45909 Unspecified asthma, uncomplicated: Secondary | ICD-10-CM | POA: Insufficient documentation

## 2013-08-16 DIAGNOSIS — IMO0001 Reserved for inherently not codable concepts without codable children: Secondary | ICD-10-CM | POA: Insufficient documentation

## 2013-08-16 DIAGNOSIS — E0500 Thyrotoxicosis with diffuse goiter without thyrotoxic crisis or storm: Secondary | ICD-10-CM | POA: Insufficient documentation

## 2013-08-16 DIAGNOSIS — Z8742 Personal history of other diseases of the female genital tract: Secondary | ICD-10-CM | POA: Insufficient documentation

## 2013-08-16 DIAGNOSIS — G4730 Sleep apnea, unspecified: Secondary | ICD-10-CM | POA: Insufficient documentation

## 2013-08-16 DIAGNOSIS — E119 Type 2 diabetes mellitus without complications: Secondary | ICD-10-CM | POA: Insufficient documentation

## 2013-08-16 DIAGNOSIS — F3289 Other specified depressive episodes: Secondary | ICD-10-CM | POA: Insufficient documentation

## 2013-08-16 DIAGNOSIS — R109 Unspecified abdominal pain: Secondary | ICD-10-CM | POA: Insufficient documentation

## 2013-08-16 DIAGNOSIS — Z3202 Encounter for pregnancy test, result negative: Secondary | ICD-10-CM | POA: Insufficient documentation

## 2013-08-16 DIAGNOSIS — I10 Essential (primary) hypertension: Secondary | ICD-10-CM | POA: Insufficient documentation

## 2013-08-16 DIAGNOSIS — Z8619 Personal history of other infectious and parasitic diseases: Secondary | ICD-10-CM | POA: Insufficient documentation

## 2013-08-16 DIAGNOSIS — G7000 Myasthenia gravis without (acute) exacerbation: Secondary | ICD-10-CM | POA: Insufficient documentation

## 2013-08-16 DIAGNOSIS — Z79899 Other long term (current) drug therapy: Secondary | ICD-10-CM | POA: Insufficient documentation

## 2013-08-16 DIAGNOSIS — Z9889 Other specified postprocedural states: Secondary | ICD-10-CM | POA: Insufficient documentation

## 2013-08-16 DIAGNOSIS — F329 Major depressive disorder, single episode, unspecified: Secondary | ICD-10-CM | POA: Insufficient documentation

## 2013-08-16 DIAGNOSIS — IMO0002 Reserved for concepts with insufficient information to code with codable children: Secondary | ICD-10-CM | POA: Insufficient documentation

## 2013-08-16 DIAGNOSIS — F319 Bipolar disorder, unspecified: Secondary | ICD-10-CM | POA: Insufficient documentation

## 2013-08-16 LAB — CBC WITH DIFFERENTIAL/PLATELET
Basophils Absolute: 0 10*3/uL (ref 0.0–0.1)
Basophils Relative: 0 % (ref 0–1)
Eosinophils Absolute: 0.3 10*3/uL (ref 0.0–0.7)
Eosinophils Relative: 3 % (ref 0–5)
HCT: 41.1 % (ref 36.0–46.0)
Hemoglobin: 13.5 g/dL (ref 12.0–15.0)
Lymphocytes Relative: 39 % (ref 12–46)
Lymphs Abs: 3.1 10*3/uL (ref 0.7–4.0)
MCH: 27 pg (ref 26.0–34.0)
MCHC: 32.8 g/dL (ref 30.0–36.0)
MCV: 82.2 fL (ref 78.0–100.0)
Monocytes Absolute: 0.6 10*3/uL (ref 0.1–1.0)
Monocytes Relative: 7 % (ref 3–12)
Neutro Abs: 4.1 10*3/uL (ref 1.7–7.7)
Neutrophils Relative %: 51 % (ref 43–77)
Platelets: 275 10*3/uL (ref 150–400)
RBC: 5 MIL/uL (ref 3.87–5.11)
RDW: 13.5 % (ref 11.5–15.5)
WBC: 8.1 10*3/uL (ref 4.0–10.5)

## 2013-08-16 LAB — COMPREHENSIVE METABOLIC PANEL
ALT: 10 U/L (ref 0–35)
AST: 20 U/L (ref 0–37)
Albumin: 3.6 g/dL (ref 3.5–5.2)
Alkaline Phosphatase: 97 U/L (ref 39–117)
BUN: 12 mg/dL (ref 6–23)
CO2: 28 mEq/L (ref 19–32)
Calcium: 9.2 mg/dL (ref 8.4–10.5)
Chloride: 100 mEq/L (ref 96–112)
Creatinine, Ser: 0.72 mg/dL (ref 0.50–1.10)
GFR calc Af Amer: >90 mL/min (ref >90)
GFR calc non Af Amer: >90 mL/min (ref >90)
Glucose, Bld: 108 mg/dL — ABNORMAL HIGH (ref 70–99)
Potassium: 4.1 mEq/L (ref 3.7–5.3)
Sodium: 138 mEq/L (ref 137–147)
Total Bilirubin: 0.2 mg/dL — ABNORMAL LOW (ref 0.3–1.2)
Total Protein: 7.9 g/dL (ref 6.0–8.3)

## 2013-08-16 LAB — URINALYSIS, ROUTINE W REFLEX MICROSCOPIC
Bilirubin Urine: NEGATIVE
Glucose, UA: >1000 mg/dL — AB
Hgb urine dipstick: NEGATIVE
Ketones, ur: NEGATIVE mg/dL
Leukocytes, UA: NEGATIVE
Nitrite: NEGATIVE
Protein, ur: NEGATIVE mg/dL
Specific Gravity, Urine: 1.028 (ref 1.005–1.030)
Urobilinogen, UA: 0.2 mg/dL (ref 0.0–1.0)
pH: 5.5 (ref 5.0–8.0)

## 2013-08-16 LAB — PREGNANCY, URINE: Preg Test, Ur: NEGATIVE

## 2013-08-16 LAB — URINE MICROSCOPIC-ADD ON

## 2013-08-16 LAB — LIPASE, BLOOD: Lipase: 26 U/L (ref 11–59)

## 2013-08-16 MED ORDER — PROMETHAZINE HCL 25 MG/ML IJ SOLN
12.5000 mg | Freq: Once | INTRAMUSCULAR | Status: AC
  Administered 2013-08-16: 12.5 mg via INTRAVENOUS
  Filled 2013-08-16 (×2): qty 1

## 2013-08-16 MED ORDER — PROMETHAZINE HCL 25 MG PO TABS: 25.0000 mg | ORAL_TABLET | Freq: Three times a day (TID) | ORAL | Status: DC | PRN

## 2013-08-16 MED ORDER — DIPHENHYDRAMINE HCL 25 MG PO CAPS
25.0000 mg | ORAL_CAPSULE | Freq: Once | ORAL | Status: AC
  Administered 2013-08-16: 25 mg via ORAL
  Filled 2013-08-16: qty 1

## 2013-08-16 MED ORDER — HYDROMORPHONE HCL PF 1 MG/ML IJ SOLN
1.0000 mg | Freq: Once | INTRAMUSCULAR | Status: AC
  Administered 2013-08-16: 1 mg via INTRAVENOUS
  Filled 2013-08-16: qty 1

## 2013-08-16 MED ORDER — IOHEXOL 300 MG/ML  SOLN
50.0000 mL | Freq: Once | INTRAMUSCULAR | Status: AC | PRN
  Administered 2013-08-16: 50 mL via ORAL

## 2013-08-16 MED ORDER — OXYCODONE-ACETAMINOPHEN 5-325 MG PO TABS: 1.0000 | ORAL_TABLET | Freq: Four times a day (QID) | ORAL | Status: DC | PRN

## 2013-08-16 MED ORDER — ONDANSETRON HCL 4 MG/2ML IJ SOLN
4.0000 mg | Freq: Once | INTRAMUSCULAR | Status: AC
  Administered 2013-08-16: 4 mg via INTRAVENOUS
  Filled 2013-08-16: qty 2

## 2013-08-16 MED ORDER — SODIUM CHLORIDE 0.9 % IV BOLUS (SEPSIS)
1000.0000 mL | Freq: Once | INTRAVENOUS | Status: AC
  Administered 2013-08-16: 1000 mL via INTRAVENOUS

## 2013-08-16 MED ORDER — IOHEXOL 300 MG/ML  SOLN
100.0000 mL | Freq: Once | INTRAMUSCULAR | Status: AC | PRN
  Administered 2013-08-16: 100 mL via INTRAVENOUS

## 2013-08-16 MED ORDER — MORPHINE SULFATE 4 MG/ML IJ SOLN
6.0000 mg | Freq: Once | INTRAMUSCULAR | Status: AC
  Administered 2013-08-16: 6 mg via INTRAVENOUS
  Filled 2013-08-16: qty 2

## 2013-08-16 NOTE — Discharge Instructions (Signed)
Followup with your GI Dr. return here for any worsening in your condition.  Slowly increase your fluid intake

## 2013-08-16 NOTE — ED Notes (Signed)
Pt c/o mid abd pain since she had an endoscopy on 5/21.  Denies NVD.  Had an endoscopy for her GERD.
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
## 2013-08-16 NOTE — ED Provider Notes (Signed)
Medical screening examination/treatment/procedure(s) were performed by non-physician practitioner and as supervising physician I was immediately available for consultation/collaboration.   EKG Interpretation None        Osvaldo Shipper, MD 08/16/13 2245

## 2013-08-18 ENCOUNTER — Other Ambulatory Visit: Payer: Self-pay | Admitting: Neurology

## 2013-08-18 LAB — URINE CULTURE: Colony Count: 9000

## 2013-08-19 ENCOUNTER — Other Ambulatory Visit: Payer: Self-pay | Admitting: *Deleted

## 2013-08-19 MED ORDER — SUMATRIPTAN SUCCINATE 50 MG PO TABS: 50.0000 mg | ORAL_TABLET | ORAL | Status: DC | PRN

## 2013-08-19 NOTE — Telephone Encounter (Signed)
rx sent in #9 with 3 rfs.

## 2013-08-19 NOTE — Telephone Encounter (Signed)
Order sent.  #9 with 3 rfs.

## 2013-08-19 NOTE — Telephone Encounter (Signed)
Order sent. #9 with 3 refills.

## 2013-08-19 NOTE — Telephone Encounter (Signed)
How many refills? 

## 2013-08-27 ENCOUNTER — Ambulatory Visit: Payer: Medicare Other | Attending: Podiatry | Admitting: Physical Therapy

## 2013-08-27 DIAGNOSIS — R5381 Other malaise: Secondary | ICD-10-CM | POA: Insufficient documentation

## 2013-08-27 DIAGNOSIS — E1149 Type 2 diabetes mellitus with other diabetic neurological complication: Secondary | ICD-10-CM | POA: Insufficient documentation

## 2013-08-27 DIAGNOSIS — M6281 Muscle weakness (generalized): Secondary | ICD-10-CM | POA: Insufficient documentation

## 2013-08-27 DIAGNOSIS — E1142 Type 2 diabetes mellitus with diabetic polyneuropathy: Secondary | ICD-10-CM | POA: Insufficient documentation

## 2013-08-27 DIAGNOSIS — IMO0001 Reserved for inherently not codable concepts without codable children: Secondary | ICD-10-CM | POA: Insufficient documentation

## 2013-08-27 DIAGNOSIS — R269 Unspecified abnormalities of gait and mobility: Secondary | ICD-10-CM | POA: Insufficient documentation

## 2013-08-27 DIAGNOSIS — M2550 Pain in unspecified joint: Secondary | ICD-10-CM | POA: Insufficient documentation

## 2013-09-02 ENCOUNTER — Ambulatory Visit: Payer: Medicare Other | Admitting: Physical Therapy

## 2013-09-02 DIAGNOSIS — R5381 Other malaise: Secondary | ICD-10-CM | POA: Diagnosis not present

## 2013-09-02 DIAGNOSIS — M6281 Muscle weakness (generalized): Secondary | ICD-10-CM | POA: Diagnosis not present

## 2013-09-02 DIAGNOSIS — E1142 Type 2 diabetes mellitus with diabetic polyneuropathy: Secondary | ICD-10-CM | POA: Diagnosis not present

## 2013-09-02 DIAGNOSIS — IMO0001 Reserved for inherently not codable concepts without codable children: Secondary | ICD-10-CM | POA: Diagnosis not present

## 2013-09-02 DIAGNOSIS — E1149 Type 2 diabetes mellitus with other diabetic neurological complication: Secondary | ICD-10-CM | POA: Diagnosis not present

## 2013-09-02 DIAGNOSIS — R269 Unspecified abnormalities of gait and mobility: Secondary | ICD-10-CM | POA: Diagnosis not present

## 2013-09-02 DIAGNOSIS — M2550 Pain in unspecified joint: Secondary | ICD-10-CM | POA: Diagnosis not present

## 2013-09-08 ENCOUNTER — Other Ambulatory Visit: Payer: Self-pay | Admitting: Neurology

## 2013-09-09 NOTE — Telephone Encounter (Signed)
This looks like it was sent on 08-19-13 but the pharmacy said that they did not receive it.

## 2013-09-28 LAB — HM DIABETES EYE EXAM

## 2013-10-02 ENCOUNTER — Ambulatory Visit (INDEPENDENT_AMBULATORY_CARE_PROVIDER_SITE_OTHER): Payer: Medicare Other | Admitting: Neurology

## 2013-10-02 ENCOUNTER — Encounter: Payer: Self-pay | Admitting: Neurology

## 2013-10-02 VITALS — BP 104/70 | HR 69 | Ht 66.93 in | Wt 213.3 lb

## 2013-10-02 DIAGNOSIS — G7000 Myasthenia gravis without (acute) exacerbation: Secondary | ICD-10-CM

## 2013-10-02 MED ORDER — PYRIDOSTIGMINE BROMIDE 60 MG PO TABS: 60.0000 mg | ORAL_TABLET | Freq: Four times a day (QID) | ORAL | Status: DC

## 2013-10-02 MED ORDER — PREDNISONE 10 MG PO TABS: 10.0000 mg | ORAL_TABLET | Freq: Every day | ORAL | Status: DC

## 2013-10-02 MED ORDER — AZATHIOPRINE 50 MG PO TABS: 150.0000 mg | ORAL_TABLET | Freq: Every day | ORAL | Status: DC

## 2013-10-02 NOTE — Progress Notes (Signed)
[facility]  Follow-up Visit   Date: 10/02/2013   Jade Mathis MRN: [id number] DOB: 09-19-1975   Interim History:  Jade Mathis is a 38 y.o. 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 Patient is accompanied by self.  She was last seen in the clinic on 04/04/13.   History of present illness: She was diagnosed with myasthenia gravis in 05-08-95 at which time symptoms were manifested with systemic weakness, double vision, ptosis, and problems with swallowing and speaking. Diagnosis was made based on repetitive nerve stimulation and blood tests, however we do not have these records. 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 Around the same time, prednisone and mestinon was initiated. At some point, she was started on azathioprine. Imuran was stopped in 05-07-2002 because of an unplanned pregnancy and she remained on prednisone 17m until 218-Feb-2011when she had an exacerbation requiring out-patient PLEX. In 202-18-11 Imuran was restarted (dose unknown). She been on prednisone since 118-Feb-1997and the lowest dose is her current dose at 177mdaily. Cellcept was tried but she developed a rash so it was stopped.   [facility] LeTruman Hayward2[facility]-02-[facility] and then [facility]. Most recently (12/2011 - 07/2012), she [facility]  When she initially saw Dr. WoJacelyn Gripn April 2013, she was only on prednisone 1027mnd mestinon 80m6mD and  demonstrated clinical signs of weakness, therefore Imuran was restarted.  There was also a huge overlay of depression which started after her father's death in 2013Feb 18, 2013n he died within a month of being diagnosed with lung cancer diagnosis. She sees Dr. AkinDarleene CleaverNeurBraxton has adjusted her bipolar medications and has behavorial therapist that is coming to her home.    Of note, she has a complex headache history and sees Dr. FreeDomingo Cocking chronic migraines.   10/2 Follow-up:  Reduced Mestinon 80mg36m because she was complaining of diarrhea and cramping, but there was no change. She also started taking INH 300mg 80mvitamin B6 25mg f30mositive PPD.    11/11 Follow-up:  Mestinon increased to 80mg QI74me to patient feeling that she got weaker on TID dosing.  04/04/2013: She complains of whole body pain, headaches, nausea, abdominal, chest pain and has been having a difficult time trying to establish care with pain management because she tells me that malingering has been documented in her chart, but it is not true.     UPDATE 10/02/2013:  Regarding her myasthenia, there has been no new worsening of symptoms.  Denies any ptosis, dysphagia, dysarthria, or generalized weakness. She is seeing Dr. GyartengMelina Fiddlerag) in Pain Management (April 2015) who started her on percocet 10mg TID80mch has improved her overall pain.  She now seeing Dr. Betti [facility]Lin [facility] and is trying to walk without her cane.  Symptoms seem to fluctuate and always depends on the day and "what's going on".  She has a therapy that comes to her home once per week.     Medications:  Current Outpatient Prescriptions on File  Prior to Visit  Medication Sig Dispense Refill  . albuterol (PROVENTIL HFA;VENTOLIN HFA) 108 (90 BASE) MCG/ACT inhaler Inhale 2 puffs into the lungs every 6 (six) hours as needed for wheezing or shortness of breath.      Marland Kitchen albuterol (PROVENTIL)  (2.5 MG/3ML) 0.083% nebulizer solution Take 2.5 mg by nebulization every 4 (four) hours as needed for wheezing or shortness of breath.       . beclomethasone (QVAR) 80 MCG/ACT inhaler Inhale 2 puffs into the lungs 2 (two) times daily.      . Canagliflozin (INVOKANA) 100 MG TABS Take 100 mg by mouth daily.      . fluticasone (FLONASE) 50 MCG/ACT nasal spray Place 1 spray into both nostrils daily as needed for allergies or rhinitis.      Marland Kitchen gabapentin (NEURONTIN) 300 MG capsule Take 300 mg by mouth 3 (three) times daily.      Marland Kitchen glimepiride (AMARYL) 1 MG tablet Take 1 mg by mouth daily with breakfast.      . ondansetron (ZOFRAN) 4 MG tablet Take 4 mg by mouth every 8 (eight) hours as needed for nausea or vomiting.      . peg 3350 powder (MOVIPREP) 100 G SOLR Take 1 kit by mouth once.      . prazosin (MINIPRESS) 2 MG capsule Take 2 mg by mouth at bedtime.      . promethazine (PHENERGAN) 25 MG tablet Take 1 tablet (25 mg total) by mouth every 8 (eight) hours as needed for nausea or vomiting.  15 tablet  0  . ranitidine (ZANTAC) 150 MG tablet Take 150 mg by mouth 2 (two) times daily.      . risperiDONE (RISPERDAL) 1 MG tablet Take 1 mg by mouth every morning.       . risperiDONE (RISPERDAL) 2 MG tablet Take 2 mg by mouth at bedtime.      . SUMAtriptan (IMITREX) 50 MG tablet TAKE 1 TABLET BY MOUTH ONCE. MAY REPEAT IN TWO   HOURS IF HEADACHE PERSISTS OR RECURES  9 tablet  3  . zolmitriptan (ZOMIG) 5 MG tablet Take 5 mg by mouth daily as needed for migraine.        No current facility-administered medications on file prior to visit.    Allergies:  Allergies  Allergen Reactions  . Depo-Provera [Medroxyprogesterone] Other (See Comments)    Bad headaches.  . Vicodin [Hydrocodone-Acetaminophen] Nausea Only     Review of Systems:  CONSTITUTIONAL: No fevers, chills, night sweats or weight loss.  EYES: No visual changes or eye pain ENT: No hearing changes.  No history of nose bleeds.   RESPIRATORY: No  cough, wheezing, shortness of breath.   CARDIOVASCULAR: No chest pain, and palpitations.   GI: Negative for abdominal discomfort, blood in stools or black stools.      GU:  No history of incontinence.   MUSCLOSKELETAL: + joint pain or swelling.  + myalgias.   SKIN: Negative for lesions, rash, and itching.   HEMATOLOGY/ONCOLOGY: Negative for prolonged bleeding, bruising easily, and swollen nodes.  No history of cancer.   ENDOCRINE: Negative for cold or heat intolerance, polydipsia or goiter.   PSYCH:  + depression or anxiety symptoms.   NEURO: As Above.   Vital Signs:  BP 104/70  Pulse 69  Ht 5' 6.93" (1.7 m)  Wt 213 lb 5 oz (96.758 kg)  BMI 33.48 kg/m2  SpO2 99%   Neurological Exam: Affected is moderately blunted, but she will smile at times (  significantly improved), eye contact also improved   CRANIAL NERVES: Pupils are round and reactive to light. Extraocular movements are intact. Face is symmetric. There is no weakness of the face muscles. Tongue movements are slowed.  MOTOR:  Motor strength is 5/5 in all extremities.     MSRs: Trace reflexes throughout with absent ankle jerks bilaterally.  SENSORY:  Reduced pinprick and vibration over the right foot. Sensation is intact on the left side.  GAIT:  She is walking unassisted today, slow slightly antalgic, but stable. Her right leg is externally rotated (old)  Lab Results  Component Value Date   HGBA1C 6.2 06/26/2013   Lab Results  Component Value Date   TSH 0.76 06/26/2013     IMPRESSION: 1.  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  Clinically stable and looks well today 1.   Continue prednisone 79m daily. 2.  Increase Imuran to 1535mdaily in hopes to wean prednisone 3.  Continue mestinon 6057mID  Explained that if she continues to do well, will slowly try to taper her prednisone Encouraged her to start water exercises to help with weight as well as overall health Encouraged her to stay in cool environments  as warmer temperatures can make myasthenic symptoms worse  2.  Paresthesias of the right foot   - Likely, early peripheral neuropathy  - Continue neurontin 300m46mD  3.  Bipolar disorder with depression, seeing Dr. AkinDarleene CleaverNeurFultondaleears much better today and attributes this to feeling better, she is seeing a therapist once per week  4.  History of positive PPD, completed treatment with INH  5.  Steroid-induced diabetes, followed by Dr. KumaDwyane Dee  Chronic migraines, followed by Dr. FreeDomingo Cocking  Fibromyalgia and chronic pain, followed by Dr. GyarMelina Fiddle[facility])  8.  Return to clinic in 2-mo103-monthcheck blood work, if stable, will plan to wean prednisone  The duration of this appointment visit was 30 minutes of face-to-face time with the patient.  Greater than 50% of this time was spent in counseling, explanation of diagnosis, planning of further management, and coordination of care.   Thank you for allowing me to participate in patient's care.  If I can answer any additional questions, I would be pleased to do so.    Sincerely,     K. PatelPosey Pronto

## 2013-10-02 NOTE — Patient Instructions (Signed)
1.  Increase Imuran to 150mg  (3 tablets) in the morning 2.  Continue prednisone 10mg  daily 3.  Continue mestinon 60mg  three times daily 4.  Start doing water exercises 5.  You look great today, keep up the good work! 6.  Return to clinic in 65-months

## 2013-10-02 NOTE — Progress Notes (Signed)
Note faxed.

## 2013-10-09 ENCOUNTER — Telehealth: Payer: Self-pay | Admitting: Endocrinology

## 2013-10-21 ENCOUNTER — Ambulatory Visit: Payer: Self-pay | Admitting: Internal Medicine

## 2013-10-22 ENCOUNTER — Telehealth: Payer: Self-pay | Admitting: *Deleted

## 2013-10-22 NOTE — Telephone Encounter (Signed)
Left msg stating wanting to inform md we did received referral for hub seat & shower head. Pt has [place] is not in their network. [place] [facility] so the referral with advance has been decline. They will notify pt as well...Johny Chess

## 2013-10-23 NOTE — Telephone Encounter (Signed)
Ok Thx 

## 2013-10-28 ENCOUNTER — Other Ambulatory Visit (INDEPENDENT_AMBULATORY_CARE_PROVIDER_SITE_OTHER): Payer: Commercial Managed Care - HMO

## 2013-10-28 DIAGNOSIS — E119 Type 2 diabetes mellitus without complications: Secondary | ICD-10-CM

## 2013-10-28 LAB — BASIC METABOLIC PANEL
BUN: 14 mg/dL (ref 6–23)
CO2: 23 mEq/L (ref 19–32)
Calcium: 8.7 mg/dL (ref 8.4–10.5)
Chloride: 104 mEq/L (ref 96–112)
Creatinine, Ser: 0.8 mg/dL (ref 0.4–1.2)
GFR: 110.82 mL/min (ref >60.00)
Glucose, Bld: 151 mg/dL — ABNORMAL HIGH (ref 70–99)
Potassium: 3.9 mEq/L (ref 3.5–5.1)
Sodium: 135 mEq/L (ref 135–145)

## 2013-10-28 LAB — HEMOGLOBIN A1C: Hgb A1c MFr Bld: 6.2 % (ref 4.6–6.5)

## 2013-10-31 ENCOUNTER — Ambulatory Visit (INDEPENDENT_AMBULATORY_CARE_PROVIDER_SITE_OTHER): Payer: Medicare HMO | Admitting: Endocrinology

## 2013-10-31 ENCOUNTER — Encounter: Payer: Self-pay | Admitting: Endocrinology

## 2013-10-31 VITALS — BP 121/91 | HR 70 | Temp 98.0°F | Resp 16 | Ht 66.0 in | Wt 213.4 lb

## 2013-10-31 DIAGNOSIS — R635 Abnormal weight gain: Secondary | ICD-10-CM

## 2013-10-31 DIAGNOSIS — E119 Type 2 diabetes mellitus without complications: Secondary | ICD-10-CM

## 2013-10-31 DIAGNOSIS — E890 Postprocedural hypothyroidism: Secondary | ICD-10-CM

## 2013-10-31 LAB — FRUCTOSAMINE: Fructosamine: 217 umol/L (ref 190–270)

## 2013-10-31 MED ORDER — LORCASERIN HCL 10 MG PO TABS: 10.0000 mg | ORAL_TABLET | Freq: Two times a day (BID) | ORAL | Status: DC
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
## 2013-10-31 NOTE — Patient Instructions (Addendum)
Please check blood sugars at least half the time about 2 hours after any meal and times per week on waking up. Please bring blood sugar monitor to each visit  Walk as tolerated  Start Belviq 1 tablet twice a day

## 2013-11-10 ENCOUNTER — Inpatient Hospital Stay (HOSPITAL_COMMUNITY)
Admission: AD | Admit: 2013-11-10 | Discharge: 2013-11-10 | Disposition: A | Discharge status: Home / Self Care | Payer: Medicare HMO | Source: Ambulatory Visit | Attending: Obstetrics and Gynecology | Admitting: Obstetrics and Gynecology

## 2013-11-10 ENCOUNTER — Encounter (HOSPITAL_COMMUNITY): Payer: Self-pay | Admitting: *Deleted

## 2013-11-10 DIAGNOSIS — N760 Acute vaginitis: Secondary | ICD-10-CM | POA: Diagnosis not present

## 2013-11-10 DIAGNOSIS — R300 Dysuria: Secondary | ICD-10-CM | POA: Diagnosis present

## 2013-11-10 DIAGNOSIS — E119 Type 2 diabetes mellitus without complications: Secondary | ICD-10-CM | POA: Insufficient documentation

## 2013-11-10 LAB — URINALYSIS, ROUTINE W REFLEX MICROSCOPIC
Bilirubin Urine: NEGATIVE
Glucose, UA: >1000 mg/dL — AB
Hgb urine dipstick: NEGATIVE
Ketones, ur: NEGATIVE mg/dL
Leukocytes, UA: NEGATIVE
Nitrite: NEGATIVE
Protein, ur: NEGATIVE mg/dL
Specific Gravity, Urine: <1.005 — ABNORMAL LOW (ref 1.005–1.030)
Urobilinogen, UA: 0.2 mg/dL (ref 0.0–1.0)
pH: 6 (ref 5.0–8.0)

## 2013-11-10 LAB — WET PREP, GENITAL
Clue Cells Wet Prep HPF POC: NONE SEEN
Trich, Wet Prep: NONE SEEN
Yeast Wet Prep HPF POC: NONE SEEN

## 2013-11-10 LAB — URINE MICROSCOPIC-ADD ON

## 2013-11-10 LAB — GLUCOSE, CAPILLARY: Glucose-Capillary: 99 mg/dL (ref 70–99)

## 2013-11-10 LAB — POCT PREGNANCY, URINE: Preg Test, Ur: NEGATIVE

## 2013-11-10 MED ORDER — TERCONAZOLE 0.4 % VA CREA: 1.0000 | TOPICAL_CREAM | Freq: Every day | VAGINAL | Status: DC

## 2013-11-10 MED ORDER — ONDANSETRON 4 MG PO TBDP
4.0000 mg | ORAL_TABLET | Freq: Once | ORAL | Status: AC
  Administered 2013-11-10: 4 mg via ORAL
  Filled 2013-11-10: qty 1

## 2013-11-10 MED ORDER — NITROFURANTOIN MONOHYD MACRO 100 MG PO CAPS: 100.0000 mg | ORAL_CAPSULE | Freq: Two times a day (BID) | ORAL | Status: AC

## 2013-11-10 MED ORDER — ONDANSETRON 8 MG PO TBDP: 8.0000 mg | ORAL_TABLET | Freq: Three times a day (TID) | ORAL | Status: DC | PRN

## 2013-11-10 NOTE — Discharge Instructions (Signed)
Monilial Vaginitis Vaginitis in a soreness, swelling and redness (inflammation) of the vagina and vulva. Monilial vaginitis is not a sexually transmitted infection. CAUSES  Yeast vaginitis is caused by yeast (candida) that is normally found in your vagina. With a yeast infection, the candida has overgrown in number to a point that upsets the chemical balance. SYMPTOMS   White, thick vaginal discharge.  Swelling, itching, redness and irritation of the vagina and possibly the lips of the vagina (vulva).  Burning or painful urination.  Painful intercourse. DIAGNOSIS  Things that may contribute to monilial vaginitis are:  Postmenopausal and virginal states.  Pregnancy.  Infections.  Being tired, sick or stressed, especially if you had monilial vaginitis in the past.  Diabetes. Good control will help lower the chance.  Birth control pills.  Tight fitting garments.  Using bubble bath, feminine sprays, douches or deodorant tampons.  Taking certain medications that kill germs (antibiotics).  Sporadic recurrence can occur if you become ill. TREATMENT  Your caregiver will give you medication.  There are several kinds of anti monilial vaginal creams and suppositories specific for monilial vaginitis. For recurrent yeast infections, use a suppository or cream in the vagina 2 times a week, or as directed.  Anti-monilial or steroid cream for the itching or irritation of the vulva may also be used. Get your caregiver's permission.  Painting the vagina with methylene blue solution may help if the monilial cream does not work.  Eating yogurt may help prevent monilial vaginitis. HOME CARE INSTRUCTIONS   Finish all medication as prescribed.  Do not have sex until treatment is completed or after your caregiver tells you it is okay.  Take warm sitz baths.  Do not douche.  Do not use tampons, especially scented ones.  Wear cotton underwear.  Avoid tight pants and panty  hose.  Tell your sexual partner that you have a yeast infection. They should go to their caregiver if they have symptoms such as mild rash or itching.  Your sexual partner should be treated as well if your infection is difficult to eliminate.  Practice safer sex. Use condoms.  Some vaginal medications cause latex condoms to fail. Vaginal medications that harm condoms are:  Cleocin cream.  Butoconazole (Femstat).  Terconazole (Terazol) vaginal suppository.  Miconazole (Monistat) (may be purchased over the counter). SEEK MEDICAL CARE IF:   You have a temperature by mouth above 102 F (38.9 C).  The infection is getting worse after 2 days of treatment.  The infection is not getting better after 3 days of treatment.  You develop blisters in or around your vagina.  You develop vaginal bleeding, and it is not your menstrual period.  You have pain when you urinate.  You develop intestinal problems.  You have pain with sexual intercourse. Document Released: 12/14/2004 Document Revised: 05/29/2011 Document Reviewed: 08/28/2008 ExitCare Patient Information 2015 [employer]. This information is not intended to replace advice given to you by your health care provider. Make sure you discuss any questions you have with your health care provider. Urinary Tract Infection Urinary tract infections (UTIs) can develop anywhere along your urinary tract. Your urinary tract is your body's drainage system for removing wastes and extra water. Your urinary tract includes two kidneys, two ureters, a bladder, and a urethra. Your kidneys are a pair of bean-shaped organs. Each kidney is about the size of your fist. They are located below your ribs, one on each side of your spine. CAUSES Infections are caused by microbes, which are   microscopic organisms, including fungi, viruses, and bacteria. These organisms are so small that they can only be seen through a microscope. Bacteria are the microbes that  most commonly cause UTIs. SYMPTOMS  Symptoms of UTIs may vary by age and gender of the patient and by the location of the infection. Symptoms in young women typically include a frequent and intense urge to urinate and a painful, burning feeling in the bladder or urethra during urination. Older women and men are more likely to be tired, shaky, and weak and have muscle aches and abdominal pain. A fever may mean the infection is in your kidneys. Other symptoms of a kidney infection include pain in your back or sides below the ribs, nausea, and vomiting. DIAGNOSIS To diagnose a UTI, your caregiver will ask you about your symptoms. Your caregiver also will ask to provide a urine sample. The urine sample will be tested for bacteria and white blood cells. White blood cells are made by your body to help fight infection. TREATMENT  Typically, UTIs can be treated with medication. Because most UTIs are caused by a bacterial infection, they usually can be treated with the use of antibiotics. The choice of antibiotic and length of treatment depend on your symptoms and the type of bacteria causing your infection. HOME CARE INSTRUCTIONS  If you were prescribed antibiotics, take them exactly as your caregiver instructs you. Finish the medication even if you feel better after you have only taken some of the medication.  Drink enough water and fluids to keep your urine clear or pale yellow.  Avoid caffeine, tea, and carbonated beverages. They tend to irritate your bladder.  Empty your bladder often. Avoid holding urine for long periods of time.  Empty your bladder before and after sexual intercourse.  After a bowel movement, women should cleanse from front to back. Use each tissue only once. SEEK MEDICAL CARE IF:   You have back pain.  You develop a fever.  Your symptoms do not begin to resolve within 3 days. SEEK IMMEDIATE MEDICAL CARE IF:   You have severe back pain or lower abdominal pain.  You  develop chills.  You have nausea or vomiting.  You have continued burning or discomfort with urination. MAKE SURE YOU:   Understand these instructions.  Will watch your condition.  Will get help right away if you are not doing well or get worse. Document Released: 12/14/2004 Document Revised: 09/05/2011 Document Reviewed: 04/14/2011 ExitCare Patient Information 2015 [employer]. This information is not intended to replace advice given to you by your health care provider. Make sure you discuss any questions you have with your health care provider.  
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
## 2013-11-11 LAB — GC/CHLAMYDIA PROBE AMP
CT Probe RNA: NEGATIVE
GC Probe RNA: NEGATIVE

## 2013-11-12 LAB — URINE CULTURE: Colony Count: 60000

## 2013-11-13 ENCOUNTER — Encounter (HOSPITAL_COMMUNITY): Payer: Self-pay | Admitting: Emergency Medicine

## 2013-11-13 ENCOUNTER — Emergency Department (HOSPITAL_COMMUNITY): Payer: Medicare HMO

## 2013-11-13 ENCOUNTER — Emergency Department (HOSPITAL_COMMUNITY)
Admission: EM | Admit: 2013-11-13 | Discharge: 2013-11-13 | Disposition: A | Discharge status: Home / Self Care | Payer: Medicare HMO | Attending: Emergency Medicine | Admitting: Emergency Medicine

## 2013-11-13 DIAGNOSIS — R638 Other symptoms and signs concerning food and fluid intake: Secondary | ICD-10-CM | POA: Insufficient documentation

## 2013-11-13 DIAGNOSIS — J019 Acute sinusitis, unspecified: Secondary | ICD-10-CM | POA: Insufficient documentation

## 2013-11-13 DIAGNOSIS — I10 Essential (primary) hypertension: Secondary | ICD-10-CM | POA: Diagnosis not present

## 2013-11-13 DIAGNOSIS — Z8739 Personal history of other diseases of the musculoskeletal system and connective tissue: Secondary | ICD-10-CM | POA: Insufficient documentation

## 2013-11-13 DIAGNOSIS — E119 Type 2 diabetes mellitus without complications: Secondary | ICD-10-CM | POA: Diagnosis not present

## 2013-11-13 DIAGNOSIS — R51 Headache: Secondary | ICD-10-CM | POA: Diagnosis not present

## 2013-11-13 DIAGNOSIS — J45901 Unspecified asthma with (acute) exacerbation: Secondary | ICD-10-CM | POA: Diagnosis not present

## 2013-11-13 DIAGNOSIS — Z79899 Other long term (current) drug therapy: Secondary | ICD-10-CM | POA: Diagnosis not present

## 2013-11-13 DIAGNOSIS — R079 Chest pain, unspecified: Secondary | ICD-10-CM | POA: Insufficient documentation

## 2013-11-13 DIAGNOSIS — R6883 Chills (without fever): Secondary | ICD-10-CM | POA: Diagnosis not present

## 2013-11-13 DIAGNOSIS — G478 Other sleep disorders: Secondary | ICD-10-CM | POA: Insufficient documentation

## 2013-11-13 DIAGNOSIS — M7989 Other specified soft tissue disorders: Secondary | ICD-10-CM | POA: Diagnosis not present

## 2013-11-13 DIAGNOSIS — Z8742 Personal history of other diseases of the female genital tract: Secondary | ICD-10-CM | POA: Insufficient documentation

## 2013-11-13 DIAGNOSIS — IMO0002 Reserved for concepts with insufficient information to code with codable children: Secondary | ICD-10-CM | POA: Diagnosis not present

## 2013-11-13 DIAGNOSIS — Z9981 Dependence on supplemental oxygen: Secondary | ICD-10-CM | POA: Insufficient documentation

## 2013-11-13 DIAGNOSIS — Z8619 Personal history of other infectious and parasitic diseases: Secondary | ICD-10-CM | POA: Insufficient documentation

## 2013-11-13 DIAGNOSIS — R05 Cough: Secondary | ICD-10-CM

## 2013-11-13 DIAGNOSIS — Z8659 Personal history of other mental and behavioral disorders: Secondary | ICD-10-CM | POA: Diagnosis not present

## 2013-11-13 DIAGNOSIS — J018 Other acute sinusitis: Secondary | ICD-10-CM

## 2013-11-13 DIAGNOSIS — R059 Cough, unspecified: Secondary | ICD-10-CM

## 2013-11-13 LAB — I-STAT TROPONIN, ED
Troponin i, poc: 0 ng/mL (ref 0.00–0.08)
Troponin i, poc: 0 ng/mL (ref 0.00–0.08)

## 2013-11-13 LAB — I-STAT CHEM 8, ED
BUN: 13 mg/dL (ref 6–23)
Calcium, Ion: 1.09 mmol/L — ABNORMAL LOW (ref 1.12–1.23)
Chloride: 99 mEq/L (ref 96–112)
Creatinine, Ser: 0.8 mg/dL (ref 0.50–1.10)
Glucose, Bld: 117 mg/dL — ABNORMAL HIGH (ref 70–99)
HCT: 47 % — ABNORMAL HIGH (ref 36.0–46.0)
Hemoglobin: 16 g/dL — ABNORMAL HIGH (ref 12.0–15.0)
Potassium: 3.8 mEq/L (ref 3.7–5.3)
Sodium: 138 mEq/L (ref 137–147)
TCO2: 26 mmol/L (ref 0–100)

## 2013-11-13 MED ORDER — IPRATROPIUM-ALBUTEROL 0.5-2.5 (3) MG/3ML IN SOLN
3.0000 mL | Freq: Once | RESPIRATORY_TRACT | Status: AC
  Administered 2013-11-13: 3 mL via RESPIRATORY_TRACT
  Filled 2013-11-13: qty 3

## 2013-11-13 MED ORDER — DIPHENHYDRAMINE HCL 50 MG/ML IJ SOLN
25.0000 mg | Freq: Once | INTRAMUSCULAR | Status: AC
  Administered 2013-11-13: 25 mg via INTRAVENOUS
  Filled 2013-11-13: qty 1

## 2013-11-13 MED ORDER — METOCLOPRAMIDE HCL 5 MG/ML IJ SOLN
10.0000 mg | Freq: Once | INTRAMUSCULAR | Status: AC
  Administered 2013-11-13: 10 mg via INTRAVENOUS
  Filled 2013-11-13: qty 2

## 2013-11-13 MED ORDER — SODIUM CHLORIDE 0.9 % IV BOLUS (SEPSIS)
500.0000 mL | Freq: Once | INTRAVENOUS | Status: AC
  Administered 2013-11-13: 500 mL via INTRAVENOUS

## 2013-11-13 MED ORDER — DEXAMETHASONE SODIUM PHOSPHATE 10 MG/ML IJ SOLN
8.0000 mg | Freq: Once | INTRAMUSCULAR | Status: AC
  Administered 2013-11-13: 8 mg via INTRAVENOUS
  Filled 2013-11-13: qty 1

## 2013-11-13 NOTE — ED Notes (Signed)
Pt respiratory labored post restroom. Pt unlabored at present time.

## 2013-11-13 NOTE — ED Notes (Signed)
Pt reports central chest pain and headache are eases. Pt resting with light dimmed at present time. Pt comfortable with warm blanket.

## 2013-11-13 NOTE — ED Notes (Signed)
Per triage nurse Delilah Shan pt reports weak bladder and in restroom to void. Pt assisted to restroom per triage nurse. Pt aware need for EKG with complaint of chest pain. Will obtain EKG immediately with pt return from restroom.

## 2013-11-13 NOTE — ED Notes (Addendum)
Pt reports asthma exacerbation starting Tuesday. Pt reports unresolved by inhaler and nebulization. Pt reports nausea, headache, runny nose, central chest pain since. Pt since at [facility] Monday and diagnoses with UTI.

## 2013-11-13 NOTE — ED Notes (Signed)
MD at bedside. 

## 2013-11-13 NOTE — Discharge Instructions (Signed)
If you were given medicines take as directed.  If you are on coumadin or contraceptives realize their levels and effectiveness is altered by many different medicines.  If you have any reaction (rash, tongues swelling, other) to the medicines stop taking and see a physician.   Take antihistamines if you choose to dry secretions (ie. Benadryl, claratin, Judithe Modest) Please follow up as directed and return to the ER or see a physician for new or worsening symptoms.  Thank you. Filed Vitals:   11/13/13 1039 11/13/13 1117 11/13/13 1200 11/13/13 1308  BP: 122/57   121/62  Pulse: 98   94  Temp: 98.6 F (37 C)   98.9 F (37.2 C)  TempSrc: Oral   Oral  Resp:   22   SpO2: 97% 100% 94% 95%

## 2013-11-13 NOTE — ED Notes (Addendum)
Registration told rn they had a "chest pain/asthma" pt. Registration reported to walked by herself into ED, pt requesting wheelchair from registration, registration to rn. rn asked registration to have pt walk to triage room and then rn would get wheelchair for pt from there. rn came out to get pt, pt ambulatory but requesting a wheelchair. rn got a wheelchair, took pt straight to a room, upon arrival to room, rn informed pt she was going to get an EKG, pt reported she had to urinate and that she had a weak bladder. rn wheel pt to bathroom, pt walked into bathroom unassisted. EKG will be performed once pt is finished urinating
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
## 2013-11-14 ENCOUNTER — Telehealth: Payer: Self-pay | Admitting: Endocrinology

## 2013-11-14 NOTE — Telephone Encounter (Signed)
Patient states she keep getting UTI when taking Invokana, Please advise

## 2013-11-17 NOTE — Telephone Encounter (Signed)
Pt is getting both yeast infections and bladder infections her OB MD and she has gone to the ER for treatment

## 2013-11-17 NOTE — Telephone Encounter (Signed)
She can stop the Invokana. Is she getting yeast infection or bladder infection? Which doctor is treating this?

## 2013-11-17 NOTE — Telephone Encounter (Signed)
Please see below and advise.

## 2013-12-02 ENCOUNTER — Ambulatory Visit (INDEPENDENT_AMBULATORY_CARE_PROVIDER_SITE_OTHER): Payer: Medicare HMO | Admitting: Endocrinology

## 2013-12-02 ENCOUNTER — Encounter: Payer: Self-pay | Admitting: Endocrinology

## 2013-12-02 VITALS — BP 102/68 | HR 76 | Temp 97.8°F | Resp 16 | Ht 66.0 in | Wt 211.0 lb

## 2013-12-02 DIAGNOSIS — E119 Type 2 diabetes mellitus without complications: Secondary | ICD-10-CM

## 2013-12-02 DIAGNOSIS — E039 Hypothyroidism, unspecified: Secondary | ICD-10-CM

## 2013-12-02 DIAGNOSIS — Z23 Encounter for immunization: Secondary | ICD-10-CM

## 2013-12-02 DIAGNOSIS — E669 Obesity, unspecified: Secondary | ICD-10-CM

## 2013-12-02 LAB — URINALYSIS, ROUTINE W REFLEX MICROSCOPIC
Bilirubin Urine: NEGATIVE
Hgb urine dipstick: NEGATIVE
Ketones, ur: NEGATIVE
Leukocytes, UA: NEGATIVE
Nitrite: NEGATIVE
RBC / HPF: NONE SEEN (ref 0–?)
Specific Gravity, Urine: <=1.005 — AB (ref 1.000–1.030)
Total Protein, Urine: NEGATIVE
Urine Glucose: NEGATIVE
Urobilinogen, UA: 0.2 (ref 0.0–1.0)
WBC, UA: NONE SEEN (ref 0–?)
pH: 5.5 (ref 5.0–8.0)

## 2013-12-02 LAB — BASIC METABOLIC PANEL
BUN: 13 mg/dL (ref 6–23)
CO2: 28 mEq/L (ref 19–32)
Calcium: 8.8 mg/dL (ref 8.4–10.5)
Chloride: 104 mEq/L (ref 96–112)
Creatinine, Ser: 0.9 mg/dL (ref 0.4–1.2)
GFR: 95.87 mL/min (ref >60.00)
Glucose, Bld: 114 mg/dL — ABNORMAL HIGH (ref 70–99)
Potassium: 3.8 mEq/L (ref 3.5–5.1)
Sodium: 138 mEq/L (ref 135–145)

## 2013-12-02 LAB — TSH: TSH: 0.88 u[IU]/mL (ref 0.35–4.50)

## 2013-12-02 NOTE — Patient Instructions (Addendum)
Stop Belviq  Please check blood sugars at least half the time about 2 hours after any meal and 3 times per week on waking up. Please bring blood sugar monitor to each visit
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
## 2013-12-02 NOTE — Progress Notes (Signed)
Quick Note:  Please let patient know that the lab results are all normal and no further action needed ______

## 2013-12-03 ENCOUNTER — Other Ambulatory Visit: Payer: Self-pay | Admitting: Neurology

## 2013-12-03 ENCOUNTER — Encounter: Payer: Self-pay | Admitting: Neurology

## 2013-12-03 ENCOUNTER — Ambulatory Visit (INDEPENDENT_AMBULATORY_CARE_PROVIDER_SITE_OTHER): Payer: Medicare HMO | Admitting: Neurology

## 2013-12-03 VITALS — BP 100/70 | HR 78 | Ht 66.0 in | Wt 213.4 lb

## 2013-12-03 DIAGNOSIS — G7000 Myasthenia gravis without (acute) exacerbation: Secondary | ICD-10-CM

## 2013-12-03 DIAGNOSIS — G609 Hereditary and idiopathic neuropathy, unspecified: Secondary | ICD-10-CM

## 2013-12-03 DIAGNOSIS — Z79899 Other long term (current) drug therapy: Secondary | ICD-10-CM

## 2013-12-03 LAB — CBC
HCT: 37.1 % (ref 36.0–46.0)
Hemoglobin: 12 g/dL (ref 12.0–15.0)
MCH: 26.1 pg (ref 26.0–34.0)
MCHC: 32.3 g/dL (ref 30.0–36.0)
MCV: 80.7 fL (ref 78.0–100.0)
Platelets: 292 10*3/uL (ref 150–400)
RBC: 4.6 MIL/uL (ref 3.87–5.11)
RDW: 14.1 % (ref 11.5–15.5)
WBC: 4.9 10*3/uL (ref 4.0–10.5)

## 2013-12-03 LAB — COMPREHENSIVE METABOLIC PANEL
ALT: 16 U/L (ref 0–35)
AST: 15 U/L (ref 0–37)
Albumin: 3.6 g/dL (ref 3.5–5.2)
Alkaline Phosphatase: 83 U/L (ref 39–117)
BUN: 13 mg/dL (ref 6–23)
CO2: 28 mEq/L (ref 19–32)
Calcium: 8.6 mg/dL (ref 8.4–10.5)
Chloride: 105 mEq/L (ref 96–112)
Creat: 0.72 mg/dL (ref 0.50–1.10)
Glucose, Bld: 57 mg/dL — ABNORMAL LOW (ref 70–99)
Potassium: 4.6 mEq/L (ref 3.5–5.3)
Sodium: 138 mEq/L (ref 135–145)
Total Bilirubin: 0.3 mg/dL (ref 0.2–1.2)
Total Protein: 6.4 g/dL (ref 6.0–8.3)

## 2013-12-03 MED ORDER — GABAPENTIN 300 MG PO CAPS: 300.0000 mg | ORAL_CAPSULE | Freq: Three times a day (TID) | ORAL | Status: DC

## 2013-12-03 NOTE — Progress Notes (Signed)
[facility]  Follow-up Visit   Date: 12/03/2013   [name] MRN: [id number] DOB: 1975/05/17   Interim History:  Jade Mathis is a 38 y.o. 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     History of present illness: She was diagnosed with myasthenia gravis in Jul 09, 1995 at which time symptoms were manifested with systemic weakness, double vision, ptosis, and problems with swallowing and speaking. Diagnosis was made based on repetitive nerve stimulation and blood tests, however we do not have these records. 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 Around the same time, prednisone and mestinon was initiated. At some point, she was started on azathioprine. Imuran was stopped in Jul 09, 2002 because of an unplanned pregnancy and she remained on prednisone 20mg  until 2009/07/08 when she had an exacerbation requiring out-patient PLEX. In 07/08/09, Imuran was restarted (dose unknown). She been on prednisone since 09-Jul-1995 and the lowest dose is her current dose at 10mg  daily. Cellcept was tried but she developed a rash so it was stopped.   The patient has previously been evaluated at [facility] by Dr. Truman Hayward 2010-07-09), discharged from [facility], and then established care with Dr. Jacelyn Grip at [facility] in 07-09-2011. Most recently (12/2011 - 07/2012), she was evaluated by Dr. Jannifer Franklin at [place] in [facility].   When she initially saw Dr. Jacelyn Grip in 2011/07/09, she was only on prednisone 10mg  and mestinon 60mg  QID and demonstrated clinical signs of weakness, therefore Imuran was restarted.  There was  also a huge overlay of depression which started after her father's death in 07/09/11 when he died within a month of being diagnosed with lung cancer diagnosis. She sees Dr. Darleene Cleaver at [facility] who has adjusted her bipolar medications and has behavorial therapist that is coming to her home.    Of note, she has a complex headache history and sees Dr. Domingo Cocking for chronic migraines.   10/2 Follow-up:  Reduced Mestinon 60mg  TID because she was complaining of diarrhea and cramping, but there was no change. 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    11/11 Follow-up:  Mestinon increased to 60mg  QID due to patient feeling that she got weaker on TID dosing.  04/04/2013: She complains of whole body pain, headaches, nausea, abdominal, chest pain and has been having a difficult time trying to establish care with pain management because she tells me that malingering has been documented in her chart, but it is not true.    10/02/2013: She is seeing Dr. Melina Fiddler ([facility]) in [facility] 07-08-2013) who started her on percocet 10mg  TID which has improved her overall pain.  She now seeing Dr. Lin Landsman at [facility].  She looks good today and is trying to walk without her cane.    UPDATE 12/03/2013:  She looks great and for the first time, says that she is feeling better! Pain is slowly improving after seeing Dr. Melina Fiddler at the [facility].  She is watching her sugar intake and trying to do better about keeping sugars under control.  Regarding her myasthenia, there has been no new worsening of symptoms.   Denies any ptosis, dysphagia, dysarthria, or generalized weakness.    Medications:  Current Outpatient Prescriptions on File Prior to Visit  Medication Sig Dispense Refill  . ACCU-CHEK FASTCLIX LANCETS MISC       . acyclovir (ZOVIRAX) 400 MG tablet Take 400 mg by mouth.      Marland Kitchen albuterol (PROVENTIL HFA;VENTOLIN HFA) 108 (90 BASE)  MCG/ACT inhaler Inhale 2 puffs into the lungs every 6 (six) hours as needed for wheezing or shortness of breath.      Marland Kitchen albuterol (PROVENTIL) (2.5 MG/3ML) 0.083% nebulizer solution Take 2.5 mg by nebulization every 4 (four) hours as needed for wheezing or shortness of breath.       . azaTHIOprine (IMURAN) 50 MG tablet Take 3 tablets (150 mg total) by mouth daily.  90 tablet  5  . beclomethasone (QVAR) 80 MCG/ACT inhaler Inhale 2 puffs into the lungs 2 (two) times daily.      . cyclobenzaprine (FLEXERIL) 10 MG tablet Take 10 mg by mouth.      . fluticasone (FLONASE) 50 MCG/ACT nasal spray Place 1 spray into both nostrils daily as needed for allergies or rhinitis.      Marland Kitchen glimepiride (AMARYL) 1 MG tablet Take 1 mg by mouth daily with breakfast.      . glucose blood (ACCU-CHEK AVIVA PLUS) test strip       . levothyroxine (SYNTHROID) 175 MCG tablet       . lisinopril (PRINIVIL,ZESTRIL) 10 MG tablet       . Lorcaserin HCl (BELVIQ) 10 MG TABS Take 10 mg by mouth 2 (two) times daily.  60 tablet  1  . metFORMIN (GLUCOPHAGE) 1000 MG tablet Take 1,000 mg by mouth.      . ondansetron (ZOFRAN ODT) 8 MG disintegrating tablet Take 1 tablet (8 mg total) by mouth every 8 (eight) hours as needed for nausea or vomiting.  20 tablet  2  . ondansetron (ZOFRAN) 4 MG tablet Take 4 mg by mouth every 8 (eight) hours as needed for nausea or vomiting.      Marland Kitchen oxyCODONE-acetaminophen (PERCOCET) 10-325 MG per tablet Take 1 tablet by mouth.      . potassium chloride (KLOR-CON) 20 MEQ packet Take 20 mEq by mouth.      . prazosin (MINIPRESS) 2 MG capsule Take 2 mg by mouth at bedtime.      . predniSONE (DELTASONE) 10 MG tablet Take 1 tablet (10 mg total) by mouth daily with breakfast.  30 tablet  5  . promethazine (PHENERGAN) 25 MG tablet Take 1 tablet (25 mg total) by mouth every 8 (eight) hours as needed for nausea or vomiting.  15 tablet  0  . pyridostigmine (MESTINON) 60 MG tablet Take 1 tablet (60 mg total) by mouth 4 (four)  times daily.  90 tablet  5  . ranitidine (ZANTAC) 150 MG tablet Take 150 mg by mouth 2 (two) times daily.      . risperiDONE (RISPERDAL) 1 MG tablet Take 1 mg by mouth every morning.       . risperiDONE (RISPERDAL) 2 MG tablet Take 2 mg by mouth at bedtime.      . SUMAtriptan (IMITREX) 50 MG tablet TAKE 1 TABLET BY MOUTH ONCE. MAY REPEAT IN TWO   HOURS IF HEADACHE PERSISTS OR RECURES  9 tablet  3  . terconazole (TERAZOL 7) 0.4 % vaginal cream Place 1 applicator vaginally at bedtime.  45 g  1  . valACYclovir (VALTREX) 500 MG tablet Take 500 mg by mouth.      . zolmitriptan (ZOMIG) 5 MG  tablet Take 5 mg by mouth daily as needed for migraine.        No current facility-administered medications on file prior to visit.    Allergies:  Allergies  Allergen Reactions  . Depo-Provera [Medroxyprogesterone] Other (See Comments)    Bad headaches.  . Vicodin [Hydrocodone-Acetaminophen] Nausea Only     Review of Systems:  CONSTITUTIONAL: No fevers, chills, night sweats or weight loss.  EYES: No visual changes or eye pain ENT: No hearing changes.  No history of nose bleeds.   RESPIRATORY: No cough, wheezing, shortness of breath.   CARDIOVASCULAR: No chest pain, and palpitations.   GI: Negative for abdominal discomfort, blood in stools or black stools.      GU:  No history of incontinence.   MUSCLOSKELETAL: + joint pain or swelling.  + myalgias.   SKIN: Negative for lesions, rash, and itching.   HEMATOLOGY/ONCOLOGY: Negative for prolonged bleeding, bruising easily, and swollen nodes.  No history of cancer.   ENDOCRINE: Negative for cold or heat intolerance, polydipsia or goiter.   PSYCH:  + depression or anxiety symptoms.   NEURO: As Above.   Vital Signs:  BP 100/70  Pulse 78  Ht 5\' 6"  (1.676 m)  Wt 213 lb 6 oz (96.786 kg)  BMI 34.46 kg/m2  SpO2 99%   Neurological Exam: Affected is moderately blunted, but she much more conversive today, eye contact also improved   CRANIAL NERVES:  Pupils are round and reactive to light. Extraocular movements are intact. Face is symmetric. There is no weakness of the face muscles. Tongue movements are slowed.  MOTOR:  Motor strength is 5/5 in all extremities.     MSRs: 1+ reflexes throughout with absent ankle jerks bilaterally.  SENSORY:  Reduced pinprick and vibration over the right foot. Sensation is intact on the left side.  GAIT:  She is walking unassisted, slow slightly antalgic, but stable. Her right leg is externally rotated (old)  Lab Results  Component Value Date   HGBA1C 6.2 10/28/2013   Lab Results  Component Value Date   TSH 0.88 12/02/2013     IMPRESSION: 1.  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  Clinically stable and looks well today! 1.   Continue prednisone 10mg  daily.  Discussed tapering dose, but patient not interested this time. 2.  Continue Imuran to 150mg  daily.  Check CBC and CMP today. 3.  Continue mestinon 60mg  TID  Explained that if she continues to do well, will slowly try to taper her prednisone Encouraged her to start water exercises to help with weight as well as overall health  2.  Paresthesias of the right foot   - Likely, early peripheral neuropathy  - Refills provided for neurontin 300mg  TID  3.  Bipolar disorder with depression, seeing Dr. Darleene Cleaver at [facility] appears much better today and attributes this to feeling better, she is seeing a therapist once per week  4.  History of positive PPD, completed treatment with INH  5.  Steroid-induced diabetes, followed by Dr. Dwyane Dee  6.  Chronic migraines, followed by Dr. Domingo Cocking  7.  Fibromyalgia and chronic pain, followed by Dr. Melina Fiddler ([facility])  8.  Return to clinic in 59-months  The duration of this appointment visit was 25 minutes of face-to-face time with the patient.  Greater than 50% of this time was spent in counseling, explanation of diagnosis, planning of further  management, and coordination of care.   Thank you for allowing  me to participate in patient's care.  If I can answer any additional questions, I would be pleased to do so.    Sincerely,    Donika K. Posey Pronto, DO

## 2013-12-03 NOTE — Patient Instructions (Signed)
Continue medications as you are taking them.  Keep up the great work, you look good today! Check blood work today I will see you back in 62-months

## 2013-12-07 ENCOUNTER — Emergency Department (HOSPITAL_COMMUNITY)
Admission: EM | Admit: 2013-12-07 | Discharge: 2013-12-07 | Disposition: A | Discharge status: Home / Self Care | Payer: No Typology Code available for payment source | Attending: Emergency Medicine | Admitting: Emergency Medicine

## 2013-12-07 ENCOUNTER — Emergency Department (HOSPITAL_COMMUNITY): Payer: No Typology Code available for payment source

## 2013-12-07 ENCOUNTER — Encounter (HOSPITAL_COMMUNITY): Payer: Self-pay | Admitting: Emergency Medicine

## 2013-12-07 DIAGNOSIS — E119 Type 2 diabetes mellitus without complications: Secondary | ICD-10-CM | POA: Diagnosis not present

## 2013-12-07 DIAGNOSIS — Y92410 Unspecified street and highway as the place of occurrence of the external cause: Secondary | ICD-10-CM | POA: Diagnosis not present

## 2013-12-07 DIAGNOSIS — S0993XA Unspecified injury of face, initial encounter: Secondary | ICD-10-CM | POA: Diagnosis not present

## 2013-12-07 DIAGNOSIS — S0990XA Unspecified injury of head, initial encounter: Secondary | ICD-10-CM | POA: Insufficient documentation

## 2013-12-07 DIAGNOSIS — S8990XA Unspecified injury of unspecified lower leg, initial encounter: Secondary | ICD-10-CM | POA: Insufficient documentation

## 2013-12-07 DIAGNOSIS — S298XXA Other specified injuries of thorax, initial encounter: Secondary | ICD-10-CM | POA: Insufficient documentation

## 2013-12-07 DIAGNOSIS — J45909 Unspecified asthma, uncomplicated: Secondary | ICD-10-CM | POA: Insufficient documentation

## 2013-12-07 DIAGNOSIS — Z8619 Personal history of other infectious and parasitic diseases: Secondary | ICD-10-CM | POA: Diagnosis not present

## 2013-12-07 DIAGNOSIS — Z8669 Personal history of other diseases of the nervous system and sense organs: Secondary | ICD-10-CM | POA: Insufficient documentation

## 2013-12-07 DIAGNOSIS — Z79899 Other long term (current) drug therapy: Secondary | ICD-10-CM | POA: Diagnosis not present

## 2013-12-07 DIAGNOSIS — F319 Bipolar disorder, unspecified: Secondary | ICD-10-CM | POA: Insufficient documentation

## 2013-12-07 DIAGNOSIS — IMO0002 Reserved for concepts with insufficient information to code with codable children: Secondary | ICD-10-CM | POA: Diagnosis not present

## 2013-12-07 DIAGNOSIS — S199XXA Unspecified injury of neck, initial encounter: Secondary | ICD-10-CM

## 2013-12-07 DIAGNOSIS — Y9389 Activity, other specified: Secondary | ICD-10-CM | POA: Insufficient documentation

## 2013-12-07 DIAGNOSIS — S99929A Unspecified injury of unspecified foot, initial encounter: Secondary | ICD-10-CM

## 2013-12-07 DIAGNOSIS — I10 Essential (primary) hypertension: Secondary | ICD-10-CM | POA: Insufficient documentation

## 2013-12-07 DIAGNOSIS — F3289 Other specified depressive episodes: Secondary | ICD-10-CM | POA: Insufficient documentation

## 2013-12-07 DIAGNOSIS — F329 Major depressive disorder, single episode, unspecified: Secondary | ICD-10-CM | POA: Insufficient documentation

## 2013-12-07 DIAGNOSIS — S99919A Unspecified injury of unspecified ankle, initial encounter: Secondary | ICD-10-CM

## 2013-12-07 MED ORDER — TRAMADOL HCL 50 MG PO TABS: 50.0000 mg | ORAL_TABLET | Freq: Four times a day (QID) | ORAL | Status: DC | PRN

## 2013-12-07 MED ORDER — METHOCARBAMOL 500 MG PO TABS: 500.0000 mg | ORAL_TABLET | Freq: Two times a day (BID) | ORAL | Status: DC

## 2013-12-07 MED ORDER — NAPROXEN 500 MG PO TABS: 500.0000 mg | ORAL_TABLET | Freq: Two times a day (BID) | ORAL | Status: DC

## 2013-12-07 MED ORDER — METHOCARBAMOL 500 MG PO TABS
500.0000 mg | ORAL_TABLET | Freq: Once | ORAL | Status: AC
  Administered 2013-12-07: 500 mg via ORAL
  Filled 2013-12-07: qty 1

## 2013-12-07 MED ORDER — OXYCODONE-ACETAMINOPHEN 5-325 MG PO TABS
2.0000 | ORAL_TABLET | Freq: Once | ORAL | Status: AC
  Administered 2013-12-07: 2 via ORAL
  Filled 2013-12-07: qty 2

## 2013-12-07 NOTE — ED Notes (Signed)
Pt ambulated with assistant to restroom on arrival to ED

## 2013-12-07 NOTE — ED Notes (Signed)
Pt requesting to take medications with food. Informed pt of NPO status. PA aware. Medications held until scans are resulted
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
## 2013-12-08 ENCOUNTER — Emergency Department (HOSPITAL_COMMUNITY)
Admission: EM | Admit: 2013-12-08 | Discharge: 2013-12-08 | Disposition: A | Discharge status: Home / Self Care | Payer: Medicare HMO | Attending: Emergency Medicine | Admitting: Emergency Medicine

## 2013-12-08 ENCOUNTER — Emergency Department (HOSPITAL_COMMUNITY): Payer: Medicare HMO

## 2013-12-08 ENCOUNTER — Encounter (HOSPITAL_COMMUNITY): Payer: Self-pay | Admitting: Emergency Medicine

## 2013-12-08 DIAGNOSIS — Z3202 Encounter for pregnancy test, result negative: Secondary | ICD-10-CM | POA: Diagnosis not present

## 2013-12-08 DIAGNOSIS — J159 Unspecified bacterial pneumonia: Secondary | ICD-10-CM | POA: Insufficient documentation

## 2013-12-08 DIAGNOSIS — IMO0001 Reserved for inherently not codable concepts without codable children: Secondary | ICD-10-CM | POA: Diagnosis not present

## 2013-12-08 DIAGNOSIS — R51 Headache: Secondary | ICD-10-CM | POA: Insufficient documentation

## 2013-12-08 DIAGNOSIS — Z8619 Personal history of other infectious and parasitic diseases: Secondary | ICD-10-CM | POA: Insufficient documentation

## 2013-12-08 DIAGNOSIS — J45909 Unspecified asthma, uncomplicated: Secondary | ICD-10-CM | POA: Diagnosis not present

## 2013-12-08 DIAGNOSIS — Z79899 Other long term (current) drug therapy: Secondary | ICD-10-CM | POA: Diagnosis not present

## 2013-12-08 DIAGNOSIS — Z9089 Acquired absence of other organs: Secondary | ICD-10-CM | POA: Insufficient documentation

## 2013-12-08 DIAGNOSIS — M25511 Pain in right shoulder: Secondary | ICD-10-CM

## 2013-12-08 DIAGNOSIS — R300 Dysuria: Secondary | ICD-10-CM | POA: Diagnosis not present

## 2013-12-08 DIAGNOSIS — Z8742 Personal history of other diseases of the female genital tract: Secondary | ICD-10-CM | POA: Insufficient documentation

## 2013-12-08 DIAGNOSIS — G500 Trigeminal neuralgia: Secondary | ICD-10-CM | POA: Diagnosis not present

## 2013-12-08 DIAGNOSIS — IMO0002 Reserved for concepts with insufficient information to code with codable children: Secondary | ICD-10-CM | POA: Insufficient documentation

## 2013-12-08 DIAGNOSIS — Z9889 Other specified postprocedural states: Secondary | ICD-10-CM | POA: Diagnosis not present

## 2013-12-08 DIAGNOSIS — M25512 Pain in left shoulder: Secondary | ICD-10-CM

## 2013-12-08 DIAGNOSIS — M25519 Pain in unspecified shoulder: Secondary | ICD-10-CM | POA: Diagnosis present

## 2013-12-08 DIAGNOSIS — Z791 Long term (current) use of non-steroidal anti-inflammatories (NSAID): Secondary | ICD-10-CM | POA: Diagnosis not present

## 2013-12-08 DIAGNOSIS — E119 Type 2 diabetes mellitus without complications: Secondary | ICD-10-CM | POA: Diagnosis not present

## 2013-12-08 DIAGNOSIS — J189 Pneumonia, unspecified organism: Secondary | ICD-10-CM

## 2013-12-08 DIAGNOSIS — R209 Unspecified disturbances of skin sensation: Secondary | ICD-10-CM | POA: Diagnosis not present

## 2013-12-08 DIAGNOSIS — F319 Bipolar disorder, unspecified: Secondary | ICD-10-CM | POA: Diagnosis not present

## 2013-12-08 DIAGNOSIS — R1033 Periumbilical pain: Secondary | ICD-10-CM | POA: Insufficient documentation

## 2013-12-08 DIAGNOSIS — E0500 Thyrotoxicosis with diffuse goiter without thyrotoxic crisis or storm: Secondary | ICD-10-CM | POA: Diagnosis not present

## 2013-12-08 DIAGNOSIS — I10 Essential (primary) hypertension: Secondary | ICD-10-CM | POA: Diagnosis not present

## 2013-12-08 DIAGNOSIS — Z87828 Personal history of other (healed) physical injury and trauma: Secondary | ICD-10-CM | POA: Insufficient documentation

## 2013-12-08 LAB — CBC WITH DIFFERENTIAL/PLATELET
Basophils Absolute: 0 10*3/uL (ref 0.0–0.1)
Basophils Relative: 0 % (ref 0–1)
Eosinophils Absolute: 0.1 10*3/uL (ref 0.0–0.7)
Eosinophils Relative: 1 % (ref 0–5)
HCT: 34.9 % — ABNORMAL LOW (ref 36.0–46.0)
Hemoglobin: 11.2 g/dL — ABNORMAL LOW (ref 12.0–15.0)
Lymphocytes Relative: 15 % (ref 12–46)
Lymphs Abs: 1.6 10*3/uL (ref 0.7–4.0)
MCH: 26.4 pg (ref 26.0–34.0)
MCHC: 32.1 g/dL (ref 30.0–36.0)
MCV: 82.3 fL (ref 78.0–100.0)
Monocytes Absolute: 0.5 10*3/uL (ref 0.1–1.0)
Monocytes Relative: 5 % (ref 3–12)
Neutro Abs: 8.5 10*3/uL — ABNORMAL HIGH (ref 1.7–7.7)
Neutrophils Relative %: 79 % — ABNORMAL HIGH (ref 43–77)
Platelets: 264 10*3/uL (ref 150–400)
RBC: 4.24 MIL/uL (ref 3.87–5.11)
RDW: 13.7 % (ref 11.5–15.5)
WBC: 10.7 10*3/uL — ABNORMAL HIGH (ref 4.0–10.5)

## 2013-12-08 LAB — I-STAT CHEM 8, ED
BUN: 10 mg/dL (ref 6–23)
Calcium, Ion: 1.17 mmol/L (ref 1.12–1.23)
Chloride: 102 mEq/L (ref 96–112)
Creatinine, Ser: 0.9 mg/dL (ref 0.50–1.10)
Glucose, Bld: 83 mg/dL (ref 70–99)
HCT: 33 % — ABNORMAL LOW (ref 36.0–46.0)
Hemoglobin: 11.2 g/dL — ABNORMAL LOW (ref 12.0–15.0)
Potassium: 3.5 mEq/L — ABNORMAL LOW (ref 3.7–5.3)
Sodium: 139 mEq/L (ref 137–147)
TCO2: 26 mmol/L (ref 0–100)

## 2013-12-08 LAB — URINALYSIS, ROUTINE W REFLEX MICROSCOPIC
Bilirubin Urine: NEGATIVE
Glucose, UA: NEGATIVE mg/dL
Hgb urine dipstick: NEGATIVE
Ketones, ur: NEGATIVE mg/dL
Leukocytes, UA: NEGATIVE
Nitrite: NEGATIVE
Protein, ur: NEGATIVE mg/dL
Specific Gravity, Urine: 1.019 (ref 1.005–1.030)
Urobilinogen, UA: 1 mg/dL (ref 0.0–1.0)
pH: 5.5 (ref 5.0–8.0)

## 2013-12-08 LAB — PREGNANCY, URINE: Preg Test, Ur: NEGATIVE

## 2013-12-08 LAB — I-STAT TROPONIN, ED: Troponin i, poc: 0 ng/mL (ref 0.00–0.08)

## 2013-12-08 MED ORDER — HYDROMORPHONE HCL 1 MG/ML IJ SOLN
1.0000 mg | Freq: Once | INTRAMUSCULAR | Status: AC
  Administered 2013-12-08: 1 mg via INTRAVENOUS
  Filled 2013-12-08: qty 1

## 2013-12-08 MED ORDER — IOHEXOL 300 MG/ML  SOLN
100.0000 mL | Freq: Once | INTRAMUSCULAR | Status: AC | PRN
  Administered 2013-12-08: 100 mL via INTRAVENOUS

## 2013-12-08 MED ORDER — IOHEXOL 300 MG/ML  SOLN: 50.0000 mL | Freq: Once | INTRAMUSCULAR | Status: DC | PRN

## 2013-12-08 MED ORDER — AZITHROMYCIN 250 MG PO TABS: 250.0000 mg | ORAL_TABLET | Freq: Every day | ORAL | Status: DC

## 2013-12-08 MED ORDER — ALBUTEROL SULFATE HFA 108 (90 BASE) MCG/ACT IN AERS
1.0000 | INHALATION_SPRAY | Freq: Four times a day (QID) | RESPIRATORY_TRACT | Status: DC | PRN
Start: 2013-12-08 — End: 2014-01-10

## 2013-12-08 MED ORDER — ONDANSETRON HCL 4 MG/2ML IJ SOLN
4.0000 mg | Freq: Once | INTRAMUSCULAR | Status: AC
  Administered 2013-12-08: 4 mg via INTRAVENOUS
  Filled 2013-12-08: qty 2

## 2013-12-08 NOTE — ED Notes (Signed)
EKG given to Dr. Kohut 
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
## 2013-12-08 NOTE — ED Notes (Addendum)
Pt ambulated to restroom with steady gait and cane
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
## 2013-12-08 NOTE — ED Notes (Signed)
Lauren, RN request CBG be done. I went into pt room and pt started cussing at me said I can't check her sugar and is ready to leave. Lauren, RN aware and entered room. Dr. Wilson Singer aware of situation.
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
## 2013-12-08 NOTE — ED Notes (Signed)
Patient request lab draw with IV start. RN Brittney made aware

## 2013-12-08 NOTE — ED Notes (Signed)
NP aware VS post using restroom to void and most recent BP 102/51 that came up.
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
## 2013-12-08 NOTE — ED Notes (Signed)
Pt returned to room post using wheelchair to nearby restroom to void.

## 2013-12-08 NOTE — Discharge Instructions (Signed)
Please follow the directions provided.  Be sure to follow-up with primary care provider.  You may take tylenol 650 mg by mouth every 4 hours or ibuprofen 400 mg by mouth every 6-8 hours for pain. Please establish care with a primary care provider to follow-up with.  If you develop worsening chest pain, shortness of breath, fever, you pass out, or become weak or dizzy, return to the ER for a recheck.     SEEK IMMEDIATE MEDICAL CARE IF:  You have numbness, tingling, or weakness in the arms or legs.  You develop severe headaches not relieved with medicine.  You have severe neck pain, especially tenderness in the middle of the back of your neck.  You have changes in bowel or bladder control.  There is increasing pain in any area of the body.  You have shortness of breath, light-headedness, dizziness, or fainting.  You have chest pain.  You feel sick to your stomach (nauseous), throw up (vomit), or sweat.  You have increasing abdominal discomfort.  There is blood in your urine, stool, or vomit.  You have pain in your shoulder (shoulder strap areas).  You feel your symptoms are getting worse.   Emergency Department Resource Guide 1) Find a Doctor and Pay Out of Pocket Although you won't have to find out who is covered by your insurance plan, it is a good idea to ask around and get recommendations. You will then need to call the office and see if the doctor you have chosen will accept you as a new patient and what types of options they offer for patients who are self-pay. Some doctors offer discounts or will set up payment plans for their patients who do not have insurance, but you will need to ask so you aren't surprised when you get to your appointment.  2) Contact Your Local Health Department Not all health departments have doctors that can see patients for sick visits, but many do, so it is worth a call to see if yours does. If you don't know where your local health department is, you can check  in your phone book. The CDC also has a tool to help you locate your state's health department, and many state websites also have listings of all of their local health departments.  3) Find a [facility] If your illness is not likely to be very severe or complicated, you may want to try a walk in clinic. These are popping up all over the country in pharmacies, drugstores, and shopping centers. They're usually staffed by nurse practitioners or physician assistants that have been trained to treat common illnesses and complaints. They're usually fairly quick and inexpensive. However, if you have serious medical issues or chronic medical problems, these are probably not your best option.  No Primary Care Doctor: - Call Health Connect at  [phone] - they can help you locate a primary care doctor that  accepts your insurance, provides certain services, etc. - Physician Referral Service- [phone]  Chronic Pain Problems: Organization         Address  Phone   Notes  [facility]  [phone] Patients need to be referred by their primary care doctor.   Medication Assistance: Organization         Address  Phone   Notes  [facility] Medication [facility] [address] [place], [place] [address] [phone] --Must be a resident of [facility] -- Must have NO insurance coverage whatsoever (  no Medicaid/ Medicare, etc.) -- The pt. MUST have a primary care doctor that directs their care regularly and follows them in the community   MedAssist  [phone]   [employer]  [phone]    Agencies that provide inexpensive medical care: Organization         Address  Phone   Notes  [place]  [phone]   [facility]    [phone]   [facility] [place], [place] [address] [phone]   [place] [address], [place] [phone]    Planned Parenthood    [phone]   [facility]    [phone]   [place] and [address], [place] Phone:  [phone], Fax:  [phone] Hours of Operation:  9 am - 6 pm, M-F.  Also accepts Medicaid/Medicare and self-pay.  [facility] for [place], [address], [place] Phone: [phone], Fax: [phone]. Hours of Operation:  8:30 am - 5:30 pm, M-F.  Also accepts Medicaid and self-pay.  [facility] [address], [place] Phone: [phone]   [place], [place], [place] [phone], Ext. [phone] Mondays & Thursdays: 7-9 AM.  First 15 patients are seen on a first come, first serve basis.    [place] Providers:  Organization         Address  Phone   Notes  [facility] [address],  [phone] Also accepts self-pay patients.  [facility] [address], [place]  [phone]   [place], [address], [place] [phone]   [facility] [address], [place] [phone]   Lucianne Lei [address], [place]   [phone] Only accepts [place] Access [place] patients after they have their name applied to their card.   Self-Pay (no insurance) in [facility]:  Organization         Address  Phone   Notes  Sickle Cell Patients, [facility] [place] [phone]   [facility] [place] [phone]   [facility] [place]  [place], [place], [place] [phone]   [facility]/Dr. Osei-Bonsu  [address], [place] or [address], [place] [phone] Phone number for both [place] and [place] locations is the  same.  Urgent Medical and [facility] [address], [place] [phone]   [facility] [address] [place] or [address] [phone]   [facility] [address] [place] [phone], phone; [phone], fax Sees patients 1st and 3rd Saturday of every month.  Must not qualify for public or private insurance (i.e. Medicaid, Medicare, [place] Health Choice, Veterans' Benefits)  Household income should be no more than 200% of the poverty level The clinic cannot treat you if you are pregnant or think you are pregnant  Sexually transmitted diseases are not treated at the clinic.    Dental Care: Organization         Address  Phone  Notes  [facility] Department of Public  [facility] [address] [place], [place] [phone] Accepts children up to age 37 who are enrolled in [place] or [place]; pregnant women with a Medicaid card; and children who have applied for Medicaid or [place] Health Choice, but were declined, whose parents can pay a reduced fee at time of service.  [facility] Department of [facility]  [address], [place] [phone] Accepts children up to age 83 who are enrolled in [place] or [place]; pregnant women with a Medicaid card; and children who have applied for Medicaid or [place] Health Choice, but were declined, whose parents can pay a reduced fee at time of service.  [place] Adult Dental Access PROGRAM  [place] [phone] Patients are seen by appointment only. Walk-ins are not accepted. [place] will see patients 85 years of age and older. Monday - Tuesday (8am-5pm) Most Wednesdays (8:30-5pm) $30 per visit, cash only  [facility] Adult Dental Access PROGRAM  [address], [facility] [phone] Patients are seen by appointment only. Walk-ins are not accepted. [place] will see patients  61 years of age and older. One Wednesday Evening (Monthly: Volunteer Based).  $30 per visit, cash only  [place]  [phone] for adults; Children under age 62, call Graduate Pediatric Dentistry at [phone]. Children aged 53-14, please call [phone] to request a pediatric application.  Dental services are provided in all areas of dental care including fillings, crowns and bridges, complete and partial dentures, implants, gum treatment, root canals, and extractions. Preventive care is also provided. Treatment is provided to both adults and children. Patients are selected via a lottery and there is often a waiting list.   [facility] [address], [place]  [phone] [email or web address]   [facility] [address] [place], [place] [phone], Ext. [phone] Second and Fourth Thursday of each month, opens at 6:30 AM; Clinic ends at 9 AM.  Patients are seen on a first-come first-served basis, and a limited number are seen during each clinic.   [facility]  [address] Hillard Danker [place], [place] [phone]   Eligibility Requirements You must have lived in [place], [place], or [place] counties for at least the last three months.   You cannot be eligible for state or federal sponsored [employer], including [employer], [place], or [employer].   You generally cannot be eligible for healthcare insurance through your employer.    How to apply: Eligibility screenings are held every Tuesday and Wednesday afternoon from 1:00 pm until 4:00 pm. You do not need an appointment for the interview!  [facility] [address], [place], [place]   [place]  [place] Department  [place]  [phone]    Behavioral Health Resources in the Community: Intensive Outpatient  Programs Organization         Address  Phone  Notes  [place] [address] [place], [place] [phone]   [facility] Outpatient [address] [place], [place]   ADS: Alcohol & Drug Svcs [address] [place], [place]   [place] [address]  [place], [place] or [phone]   Substance Abuse Resources Organization         Address  Phone  Notes  Alcohol and Drug Services  [place]  [phone]   [facility] [place]  [phone]   Chinita Pester  [phone]   Residential & Outpatient Substance Abuse Program  [phone]   Psychological Services Organization         Address  Phone  Notes  [facility] [place]  [place]  [phone]   [place] [address], [place] or [phone]    Mobile Crisis Teams Organization         Address  Phone  Notes  [facility]rapeutic Alternatives, Mobile Crisis Care Unit  [phone]   [facility]ive Psychotherapeutic Services  [address] [place], [place]   [place] [address] [place] [phone]    Self-Help/Support Groups Organization         Address  Phone             Notes  [place]. of [place] - variety of support groups  [place] Call for more information  Narcotics Anonymous (NA), [facility] [address], [employer] [place]  2 meetings at this location   Special educational needs teacher         Address  Phone  Notes  [facility] Residential Treatment Rafael Gonzalez,    [place]  [phone]   [facility]  [address] [place] [address], [place], [place]   [place], [place] [phone] Admissions: 8am-3pm M-F  Incentives Substance [place] [address],    [place], [place]  [phone]   [facility] [address] [place], [place], [place]   [facility] [address]  [place], [place]   Insight Programs - Intensive Outpatient [address] Kristeen [address], [place], [place]   [facility] ([place].,  [place], [place] [phone] or [phone]   [facility]idential Treatment Services (RTS) [address] [place], [place] Accepts Medicaid  [facility] [place] [address]  [place] [phone] Substance Abuse/Addiction Treatment   [facility] Organization         Address  Phone  Notes  [facility]  [phone]   Domenic Schwab, PhD [address] Arlis [place], [place]   [phone] or [phone]   [place] Bernie [place], [place] [phone]   [facility] [address], [place], [place] [phone] Insurance/Medicaid/sponsorship through [facility] and Families [address], [address]                                    [place], [place] [phone] [place] [address], [place] [phone]    Dr. Adele Schilder  [phone]   [facility] Dept. 1) [address] [place] 2) [place] 3)  [place] [address], [place] [phone]  [phone]   [place] [phone] or [phone] (After Hours)

## 2013-12-08 NOTE — ED Notes (Signed)
Pt came out of room dressed and stated "take this shit out my arm, I"m leaving". Charlann Boxer, RN went in room to take IV out, pt started cussing at [employer] and would not let RN remove IV, states she wanted PA to take IV out, Charge RN at bedside.
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
## 2013-12-08 NOTE — ED Notes (Signed)
Patient transported to X-ray 

## 2013-12-08 NOTE — ED Notes (Signed)
Pt upset in room, states "I've been here since 1 damn a clock, I'm getting nervous, and I'm fucking leaving". Pt instructed that we need to check her blood sugar before she gets anything to eat, pt refused to let Nurse Tech check her blood sugar, pt ripping cords off, pt states "I don't give a damn, I'm fucking leaving". Pt instructed not to talk to staff that way, still cussing at staff. Pt slammed door shut, will inform PA.

## 2013-12-09 NOTE — ED Provider Notes (Signed)
Medical screening examination/treatment/procedure(s) were conducted as a shared visit with non-physician practitioner(s) and myself.  I personally evaluated the patient during the encounter.   EKG Interpretation None       Orlie Dakin, MD 12/09/13 [phone]

## 2013-12-11 ENCOUNTER — Ambulatory Visit
Admission: RE | Admit: 2013-12-11 | Discharge: 2013-12-11 | Disposition: A | Discharge status: Home / Self Care | Payer: Medicare HMO | Source: Ambulatory Visit | Attending: Family Medicine | Admitting: Family Medicine

## 2013-12-11 ENCOUNTER — Other Ambulatory Visit: Payer: Self-pay | Admitting: Family Medicine

## 2013-12-11 DIAGNOSIS — R05 Cough: Secondary | ICD-10-CM

## 2013-12-11 DIAGNOSIS — R059 Cough, unspecified: Secondary | ICD-10-CM

## 2013-12-11 NOTE — ED Provider Notes (Signed)
Medical screening examination/treatment/procedure(s) were performed by non-physician practitioner and as supervising physician I was immediately available for consultation/collaboration.   EKG Interpretation   Date/Time:  Monday December 08 2013 19:22:45 EDT Ventricular Rate:  70 PR Interval:  137 QRS Duration: 105 QT Interval:  384 QTC Calculation: 414 R Axis:   53 Text Interpretation:  Sinus rhythm ED PHYSICIAN INTERPRETATION AVAILABLE  IN [place] Confirmed by TEST, Record ([id number]) on 12/10/2013 7:06:05  AM       Virgel Manifold, MD 12/11/13 1501

## 2013-12-12 ENCOUNTER — Other Ambulatory Visit: Payer: Self-pay | Admitting: Neurology

## 2013-12-12 NOTE — Telephone Encounter (Signed)
9 pills called in last time in June.  How many this time?

## 2013-12-12 NOTE — Telephone Encounter (Signed)
Order sent.

## 2013-12-16 ENCOUNTER — Encounter: Payer: Self-pay | Admitting: Dietician

## 2013-12-16 ENCOUNTER — Encounter: Payer: Medicaid Other | Attending: Endocrinology | Admitting: Dietician

## 2013-12-16 VITALS — Ht 66.0 in | Wt 211.5 lb

## 2013-12-16 DIAGNOSIS — Z6834 Body mass index (BMI) 34.0-34.9, adult: Secondary | ICD-10-CM | POA: Insufficient documentation

## 2013-12-16 DIAGNOSIS — E669 Obesity, unspecified: Secondary | ICD-10-CM

## 2013-12-16 DIAGNOSIS — Z713 Dietary counseling and surveillance: Secondary | ICD-10-CM | POA: Diagnosis not present

## 2013-12-16 NOTE — Progress Notes (Signed)
  Medical Nutrition Therapy:  Appt start time: 0830 end time:  0915.   Assessment:  Primary concerns today: The patient has been referred to [facility] for diabetes and weight gain. She states that she would prefer to discuss her weight as she has a thorough understanding of diabetes and does not have further questions. She states that she goes to complete nutrition with sister. She would like more information on weight loss. She has been a diabetic for 20 years and checks blood sugars 3-4x a week and states that they average 130-160 mg/dL. HgbA1c was 6.2% in August. Since then, [facility] reports she has been eating healthier for the last month or two and has lost 4 pounds.    Preferred Learning Style:   No preference indicated   Learning Readiness:   Ready   MEDICATIONS: see list   DIETARY INTAKE:    24-hr recall:  Breakfast: wildberries,  cup tropical fruit, aloe, blackseed oil, kale or broccoli OR Herbalife protein shake  Snack: cheese or [place] bites and 6 ginger snaps or 4 fig newtons Lunch: veggie burger with 1 slice of wheat bread and honey mustard Dinner: fried fish and sour cream potatoes and fresh green beans  Beverages: unsweetened almond milk with protein powder, water, sodas sometimes  Usual physical activity: none  Estimated energy needs: 1600-1800 calories 180-200 g carbohydrates (2-3 choices at meals and 1 at snacks)   Progress Towards Goal(s):  In progress.   Nutritional Diagnosis:  [place]-2.2 Altered nutrition-related laboratory As related to history of excessive energy intake and lack of exercise.  As evidenced by elevated HgbA1c (6.2%) and recent weight gain.    Intervention:  Nutrition counseling provided. Discussed controlled carbohydrate diet.  Goals: -180 lbs (healthy weight loss is 1-2 pounds per week) -HgbA1c 6% or lower -Have a protein food with every meal and snack -Look into water aerobics (apply [facility] scholarship [facility])  Teaching Method Utilized:   Visual Auditory  Samples provided and patient instructed on proper use: Premier protein shakes (chocolate - qty 2) Lot#: [id number] Exp: 11/2014  Handouts provided: -Living Well with Diabetes -Meal planning Card (suggested 30 grams per meal and 15 grams at snacks)  Barriers to learning/adherence to lifestyle change: none  Demonstrated degree of understanding via:  Teach Back   Monitoring/Evaluation:  Dietary intake, exercise, HgbA1c, and body weight in 3 month(s).

## 2013-12-16 NOTE — Patient Instructions (Signed)
Goals: -180 lbs (healthy weight loss is 1-2 pounds per week) -HgbA1c 6% or lower -Have a protein food with every meal and snack -Look into water aerobics (apply for scholarship at the [facility])

## 2013-12-22 ENCOUNTER — Encounter: Payer: Self-pay | Admitting: Physical Medicine & Rehabilitation

## 2014-01-05 ENCOUNTER — Encounter: Payer: Medicaid Other | Attending: Endocrinology | Admitting: *Deleted

## 2014-01-05 ENCOUNTER — Encounter: Payer: Self-pay | Admitting: *Deleted

## 2014-01-05 VITALS — Ht 66.0 in | Wt 206.5 lb

## 2014-01-05 DIAGNOSIS — E118 Type 2 diabetes mellitus with unspecified complications: Secondary | ICD-10-CM | POA: Insufficient documentation

## 2014-01-05 DIAGNOSIS — Z713 Dietary counseling and surveillance: Secondary | ICD-10-CM | POA: Diagnosis not present

## 2014-01-05 NOTE — Patient Instructions (Signed)
Goals: 1. Goal weight 180, 1-2 pounds a week.  2. Include good source of protein at all meals and snacks 3. HgbA1c 6% or lower 4. Look into water aerobics (apply for scholarship at [facility]) 5. 30 grams of carbs at meals, 15 grams at snacks

## 2014-01-05 NOTE — Progress Notes (Signed)
Medical Nutrition Therapy:  Appt start time: 0915 end time:  0945.  Assessment:  Patient returns today for a follow up visit for diabetes and weight management. She had been checking her BG in the AM with values ranging from 130-160. No recent A1c. Her meter broke 2 weeks ago, so she hasn't been checking. New Accu-check meter provided.   Patient has lost 5 pounds in the last 3 weeks. She has not changed her eating habits, continuing to monitor portions, eat healthier, and include protein at all meals and snacks. Physical activity is limited due to back pain and neuropathy. She has not yet looked into water aerobics classes.   [place]  BODY COMP RESULTS  Fat Mass (lbs) 100.5  % Fat Mass 48.3   Fat Free Mass (lbs) 106.0    MEDICATIONS: See list   DIETARY INTAKE:     24-hr recall:  Breakfast: wildberries,  cup tropical fruit, aloe, blackseed oil, kale or broccoli OR Herbalife protein shake  Snack: cheese or [place] bites and 6 ginger snaps or 4 fig newtons  Lunch: veggie burger with 1 slice of wheat bread and honey mustard  Dinner: fried fish and sour cream potatoes and fresh green beans  Beverages: unsweetened almond milk with protein powder, water, sodas sometimes  Usual physical activity: None  Estimated energy needs: 1600 calories 180 g carbohydrates  Progress Towards Goal(s):  Some progress.   Nutritional Diagnosis:  [place]-2.2 Altered nutrition-related laboratory As related to history of excessive energy intake and lack of exercise. As evidenced by elevated HgbA1c (6.2%) and recent weight gain.    Intervention:  Nutrition counseling provided. We reviewed controlled carbohydrate diet and strategies for weight loss.   Goals: 1. Goal weight 180, 1-2 pounds a week.  2. Include good source of protein at all meals and snacks 3. HgbA1c 6% or lower 4. Look into water aerobics (apply for scholarship at [facility]) 5. 30 grams of carbs at meals, 15 grams at snacks  Handouts given during  visit include:  1500 calorie 5 day meal plan  Samples provided:  Premier protein shakes (chocolate - qty 3) Lot#: [id number] Exp: 11/2014  Accu-check Nano Lot#: [id number] Exp: 11/18/2014  Monitoring/Evaluation:  Dietary intake, exercise, blood glucose, and body weight in 1 month(s).

## 2014-01-08 NOTE — Telephone Encounter (Signed)
error 

## 2014-01-10 ENCOUNTER — Inpatient Hospital Stay (HOSPITAL_COMMUNITY)
Admission: AD | Admit: 2014-01-10 | Discharge: 2014-01-10 | Disposition: A | Discharge status: Home / Self Care | Payer: Medicare HMO | Source: Ambulatory Visit | Attending: Obstetrics and Gynecology | Admitting: Obstetrics and Gynecology

## 2014-01-10 ENCOUNTER — Encounter (HOSPITAL_COMMUNITY): Payer: Self-pay

## 2014-01-10 ENCOUNTER — Inpatient Hospital Stay (HOSPITAL_COMMUNITY): Payer: Medicare HMO

## 2014-01-10 DIAGNOSIS — R1032 Left lower quadrant pain: Secondary | ICD-10-CM

## 2014-01-10 DIAGNOSIS — M549 Dorsalgia, unspecified: Secondary | ICD-10-CM | POA: Insufficient documentation

## 2014-01-10 DIAGNOSIS — R109 Unspecified abdominal pain: Secondary | ICD-10-CM

## 2014-01-10 DIAGNOSIS — R300 Dysuria: Secondary | ICD-10-CM | POA: Diagnosis not present

## 2014-01-10 DIAGNOSIS — R1031 Right lower quadrant pain: Secondary | ICD-10-CM

## 2014-01-10 LAB — URINE MICROSCOPIC-ADD ON

## 2014-01-10 LAB — URINALYSIS, ROUTINE W REFLEX MICROSCOPIC
Bilirubin Urine: NEGATIVE
Glucose, UA: NEGATIVE mg/dL
Ketones, ur: NEGATIVE mg/dL
Leukocytes, UA: NEGATIVE
Nitrite: NEGATIVE
Protein, ur: NEGATIVE mg/dL
Specific Gravity, Urine: >1.03 — ABNORMAL HIGH (ref 1.005–1.030)
Urobilinogen, UA: 1 mg/dL (ref 0.0–1.0)
pH: 6 (ref 5.0–8.0)

## 2014-01-10 LAB — POCT PREGNANCY, URINE: Preg Test, Ur: NEGATIVE

## 2014-01-10 LAB — WET PREP, GENITAL
Clue Cells Wet Prep HPF POC: NONE SEEN
Trich, Wet Prep: NONE SEEN
Yeast Wet Prep HPF POC: NONE SEEN

## 2014-01-10 MED ORDER — NITROFURANTOIN MONOHYD MACRO 100 MG PO CAPS: 100.0000 mg | ORAL_CAPSULE | Freq: Two times a day (BID) | ORAL | Status: AC

## 2014-01-10 MED ORDER — NYSTATIN 100000 UNIT/GM EX CREA: 1.0000 "application " | TOPICAL_CREAM | Freq: Two times a day (BID) | CUTANEOUS | Status: DC

## 2014-01-10 MED ORDER — NITROFURANTOIN MONOHYD MACRO 100 MG PO CAPS: 100.0000 mg | ORAL_CAPSULE | Freq: Two times a day (BID) | ORAL | Status: DC

## 2014-01-10 MED ORDER — IBUPROFEN 800 MG PO TABS: 800.0000 mg | ORAL_TABLET | Freq: Once | ORAL | Status: DC
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
## 2014-01-11 LAB — URINE CULTURE

## 2014-01-19 ENCOUNTER — Encounter (HOSPITAL_COMMUNITY): Payer: Self-pay

## 2014-01-19 ENCOUNTER — Telehealth: Payer: Self-pay | Admitting: Family Medicine

## 2014-01-19 NOTE — Telephone Encounter (Signed)
Ok to ref [employer]

## 2014-01-19 NOTE — Telephone Encounter (Signed)
[facility] callinf for referral for patient to be seen there for 1 year. Livia Snellen [phone] called to request this, needed today as she has appt. Dr Alain Marion is her PCP.

## 2014-01-20 ENCOUNTER — Other Ambulatory Visit: Payer: Self-pay | Admitting: *Deleted

## 2014-01-20 DIAGNOSIS — G894 Chronic pain syndrome: Secondary | ICD-10-CM

## 2014-01-20 NOTE — Telephone Encounter (Signed)
Jade Mathis is going to call Jade Mathis to verify what he needs...Jade Mathis

## 2014-01-26 ENCOUNTER — Other Ambulatory Visit: Payer: Self-pay | Admitting: Endocrinology

## 2014-02-04 ENCOUNTER — Encounter: Payer: Medicaid Other | Attending: Endocrinology | Admitting: Dietician

## 2014-02-04 DIAGNOSIS — E119 Type 2 diabetes mellitus without complications: Secondary | ICD-10-CM | POA: Diagnosis not present

## 2014-02-04 DIAGNOSIS — E669 Obesity, unspecified: Secondary | ICD-10-CM | POA: Diagnosis not present

## 2014-02-04 DIAGNOSIS — Z6833 Body mass index (BMI) 33.0-33.9, adult: Secondary | ICD-10-CM | POA: Insufficient documentation

## 2014-02-04 DIAGNOSIS — Z713 Dietary counseling and surveillance: Secondary | ICD-10-CM | POA: Insufficient documentation

## 2014-02-04 NOTE — Patient Instructions (Addendum)
Goals: 1. Goal weight 180 lbs or less, 1-2 pounds a week.  2. Include good source of protein at all meals and snacks 3. HgbA1c 6% or lower 4. Look into water aerobics (apply for scholarship at [facility]) 5. 30 grams of carbs at meals, 15 grams at snacks   [email or web address]  Next in-person seminar: December 7th at [place] at [facility]

## 2014-02-04 NOTE — Progress Notes (Signed)
Medical Nutrition Therapy:  Appt start time: 248 end time:  905.  Follow up:  Jade Mathis returns having gained a few pounds since last visit. She states her weight has been fluctuating recently. She has tried many diets in the past 20 years: Weight Watchers, Herbalife. She has back pain, GERD, a muscle disease, and diabetes. She checks her blood sugars 1-2x a day. Fasting BG is usually 135-150 mg/dL in the morning. She has been unable to exercise recently due to back issues. Has recently cut out all sodas. [name] has expressed interest in bariatric surgery. I informed her that she does not meet the criteria for weight loss surgery. However, she is still interested in attending the seminar and pursuing surgery if possible. She reports plans to attend the next seminar and ask questions about insurance approval.    Wt Readings from Last 3 Encounters:  02/04/14 209 lb (94.802 kg)  01/10/14 208 lb 3.2 oz (94.439 kg)  01/05/14 206 lb 8 oz (93.668 kg)   Ht Readings from Last 3 Encounters:  02/04/14 5\' 6"  (1.676 m)  01/10/14 5\' 6"  (1.676 m)  01/05/14 5\' 6"  (1.676 m)   Body mass index is 33.75 kg/(m^2). @BMIFA @ Normalized weight-for-age data available only for age 22 to 66 years. Normalized stature-for-age data available only for age 22 to 64 years.   TANITA  BODY COMP RESULTS  Fat Mass (lbs) 100.5 102  % Fat Mass 48.3 48.7   Fat Free Mass (lbs) 106.0 107    MEDICATIONS: See list   DIETARY INTAKE:     24-hr recall:  Breakfast: spinach and fruit smoothie Snack: applesauce or cottage cheese Lunch: veggie burger with 1 slice of wheat bread and honey mustard  Snack: fig newtons and string cheese or graham crackers with peanut butter Dinner: [place] with potatoes and kale and onions  Beverages: unsweetened almond milk with protein powder, water  Usual physical activity: None  Estimated energy needs: 1600 calories 180 g carbohydrates  Progress Towards Goal(s):  Some progress.   Nutritional  Diagnosis:  [place]-2.2 Altered nutrition-related laboratory As related to history of excessive energy intake and lack of exercise. As evidenced by elevated HgbA1c (6.2%) and recent weight gain.    Intervention:  Nutrition counseling provided. Provided information for bariatric seminar. Continue to follow up at [facility] monthly for supervised weight loss.   Goals: 1. Goal weight 180, 1-2 pounds a week.  2. Include good source of protein at all meals and snacks 3. HgbA1c 6% or lower 4. Look into water aerobics (apply for scholarship at [facility]) 5. 30 grams of carbs at meals, 15 grams at snacks  Samples provided and patient instructed on proper use: Premier protein shake (chocolate - qty 3) Lot#: [id number] Exp: 11/2014    Monitoring/Evaluation:  Dietary intake, exercise, blood glucose, and body weight in 1 month(s).

## 2014-02-25 ENCOUNTER — Other Ambulatory Visit: Payer: Medicaid Other

## 2014-02-25 ENCOUNTER — Other Ambulatory Visit (INDEPENDENT_AMBULATORY_CARE_PROVIDER_SITE_OTHER): Payer: Medicaid Other

## 2014-02-25 DIAGNOSIS — E119 Type 2 diabetes mellitus without complications: Secondary | ICD-10-CM

## 2014-02-25 LAB — LIPID PANEL
Cholesterol: 124 mg/dL (ref 0–200)
HDL: 28.9 mg/dL — ABNORMAL LOW (ref >39.00)
LDL Cholesterol: 80 mg/dL (ref 0–99)
NonHDL: 95.1
Total CHOL/HDL Ratio: 4
Triglycerides: 77 mg/dL (ref 0.0–149.0)
VLDL: 15.4 mg/dL (ref 0.0–40.0)

## 2014-02-25 LAB — COMPREHENSIVE METABOLIC PANEL
ALT: 13 U/L (ref 0–35)
AST: 14 U/L (ref 0–37)
Albumin: 3.5 g/dL (ref 3.5–5.2)
Alkaline Phosphatase: 80 U/L (ref 39–117)
BUN: 14 mg/dL (ref 6–23)
CO2: 27 mEq/L (ref 19–32)
Calcium: 8.7 mg/dL (ref 8.4–10.5)
Chloride: 103 mEq/L (ref 96–112)
Creatinine, Ser: 0.7 mg/dL (ref 0.4–1.2)
GFR: 117.85 mL/min (ref >60.00)
Glucose, Bld: 84 mg/dL (ref 70–99)
Potassium: 3.8 mEq/L (ref 3.5–5.1)
Sodium: 135 mEq/L (ref 135–145)
Total Bilirubin: 0.3 mg/dL (ref 0.2–1.2)
Total Protein: 7.1 g/dL (ref 6.0–8.3)

## 2014-02-25 LAB — HEMOGLOBIN A1C: Hgb A1c MFr Bld: 6.4 % (ref 4.6–6.5)

## 2014-02-26 ENCOUNTER — Other Ambulatory Visit: Payer: Self-pay

## 2014-03-03 ENCOUNTER — Ambulatory Visit: Payer: Self-pay | Admitting: Endocrinology

## 2014-03-04 ENCOUNTER — Ambulatory Visit: Payer: Self-pay | Admitting: Neurology

## 2014-03-09 ENCOUNTER — Ambulatory Visit (INDEPENDENT_AMBULATORY_CARE_PROVIDER_SITE_OTHER): Payer: Medicare HMO | Admitting: Endocrinology

## 2014-03-09 ENCOUNTER — Encounter: Payer: Self-pay | Admitting: Endocrinology

## 2014-03-09 VITALS — BP 122/82 | HR 74 | Temp 98.0°F | Resp 14 | Ht 66.0 in | Wt 203.8 lb

## 2014-03-09 DIAGNOSIS — F329 Major depressive disorder, single episode, unspecified: Secondary | ICD-10-CM

## 2014-03-09 DIAGNOSIS — B351 Tinea unguium: Secondary | ICD-10-CM

## 2014-03-09 DIAGNOSIS — E038 Other specified hypothyroidism: Secondary | ICD-10-CM

## 2014-03-09 DIAGNOSIS — E119 Type 2 diabetes mellitus without complications: Secondary | ICD-10-CM

## 2014-03-09 DIAGNOSIS — F32A Depression, unspecified: Secondary | ICD-10-CM

## 2014-03-09 DIAGNOSIS — K219 Gastro-esophageal reflux disease without esophagitis: Secondary | ICD-10-CM

## 2014-03-09 MED ORDER — TERBINAFINE HCL 250 MG PO TABS: 250.0000 mg | ORAL_TABLET | Freq: Every day | ORAL | Status: DC

## 2014-03-09 MED ORDER — RANITIDINE HCL 150 MG PO TABS: 150.0000 mg | ORAL_TABLET | Freq: Two times a day (BID) | ORAL | Status: DC | PRN

## 2014-03-09 NOTE — Patient Instructions (Signed)
Please check blood sugars at least half the time about 2 hours after any meal and times per week on waking up. Please bring blood sugar monitor to each visit  
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
## 2014-03-10 ENCOUNTER — Encounter: Payer: Medicaid Other | Attending: Endocrinology | Admitting: Dietician

## 2014-03-10 DIAGNOSIS — Z713 Dietary counseling and surveillance: Secondary | ICD-10-CM | POA: Insufficient documentation

## 2014-03-10 DIAGNOSIS — Z6833 Body mass index (BMI) 33.0-33.9, adult: Secondary | ICD-10-CM | POA: Diagnosis not present

## 2014-03-10 DIAGNOSIS — E119 Type 2 diabetes mellitus without complications: Secondary | ICD-10-CM | POA: Insufficient documentation

## 2014-03-10 DIAGNOSIS — E669 Obesity, unspecified: Secondary | ICD-10-CM | POA: Diagnosis present

## 2014-03-10 NOTE — Progress Notes (Signed)
Medical Nutrition Therapy:  Appt start time: 875 end time:  940  Follow up:  Jade Mathis returns having lost 5 lbs of fat. She is having some heartburn and nausea, especially with high-fat foods. We discussed the effects of foods high in fat on acid reflux. Jade Mathis states that she needs to leave the appointment early today to return home to her children.    Wt Readings from Last 3 Encounters:  03/09/14 203 lb 12.8 oz (92.443 kg)  02/04/14 209 lb (94.802 kg)  01/10/14 208 lb 3.2 oz (94.439 kg)   Ht Readings from Last 3 Encounters:  03/09/14 5\' 6"  (1.676 m)  02/04/14 5\' 6"  (1.676 m)  01/10/14 5\' 6"  (1.676 m)   There is no weight on file to calculate BMI. @BMIFA @ Normalized weight-for-age data available only for age 39 to 54 years. Normalized stature-for-age data available only for age 39 to 78 years.   TANITA  BODY COMP RESULTS  Fat Mass (lbs) 100.5 102 97.5  % Fat Mass 48.3 48.7 48   Fat Free Mass (lbs) 106.0 107 105.5    MEDICATIONS: See list   DIETARY INTAKE:     24-hr recall:  Breakfast: eggs, [place] bacon, and Cheerios Snack: 4 fig newtons, reduced fat cheese Lunch: soup with 3-4 crackers OR potato casserole Snack: fig newtons and string cheese or graham crackers with peanut butter Dinner: [place] with potatoes and kale and onions  Beverages: unsweetened almond milk with protein powder, water  Usual physical activity: None  Estimated energy needs: 1600 calories 180 g carbohydrates  Progress Towards Goal(s):  Some progress.   Nutritional Diagnosis:  [place]-2.2 Altered nutrition-related laboratory As related to history of excessive energy intake and lack of exercise. As evidenced by elevated HgbA1c (6.2%) and recent weight gain.    Intervention:  Nutrition counseling provided. Provided information for bariatric seminar. Continue to follow up at [facility] monthly for supervised weight loss.   Goals: 1. Goal weight 180, 1-2 pounds a week.  2. Include good source of protein at all  meals and snacks 3. HgbA1c 6% or lower 4. Look into water aerobics (apply for scholarship at [facility]) 5. 30 grams of carbs at meals, 15 grams at snacks    Monitoring/Evaluation:  Dietary intake, exercise, blood glucose, and body weight in 1 month(s).

## 2014-03-11 ENCOUNTER — Other Ambulatory Visit: Payer: Self-pay | Admitting: Neurology

## 2014-03-11 NOTE — Telephone Encounter (Signed)
Is this ok to approve?

## 2014-03-18 NOTE — Telephone Encounter (Signed)
error 

## 2014-04-09 ENCOUNTER — Other Ambulatory Visit: Payer: Self-pay | Admitting: Neurology

## 2014-04-09 ENCOUNTER — Other Ambulatory Visit: Payer: Self-pay | Admitting: *Deleted

## 2014-04-09 NOTE — Telephone Encounter (Signed)
Rx sent 

## 2014-04-15 ENCOUNTER — Ambulatory Visit: Payer: Medicare HMO | Admitting: Dietician

## 2014-04-16 ENCOUNTER — Ambulatory Visit (INDEPENDENT_AMBULATORY_CARE_PROVIDER_SITE_OTHER): Payer: Medicare HMO | Admitting: Neurology

## 2014-04-16 ENCOUNTER — Encounter: Payer: Medicaid Other | Attending: Endocrinology | Admitting: Dietician

## 2014-04-16 ENCOUNTER — Encounter: Payer: Self-pay | Admitting: Neurology

## 2014-04-16 VITALS — BP 120/88 | HR 79 | Ht 66.0 in | Wt 205.5 lb

## 2014-04-16 VITALS — Wt 204.5 lb

## 2014-04-16 DIAGNOSIS — Z6833 Body mass index (BMI) 33.0-33.9, adult: Secondary | ICD-10-CM | POA: Insufficient documentation

## 2014-04-16 DIAGNOSIS — G7000 Myasthenia gravis without (acute) exacerbation: Secondary | ICD-10-CM

## 2014-04-16 DIAGNOSIS — E669 Obesity, unspecified: Secondary | ICD-10-CM | POA: Insufficient documentation

## 2014-04-16 DIAGNOSIS — E0842 Diabetes mellitus due to underlying condition with diabetic polyneuropathy: Secondary | ICD-10-CM

## 2014-04-16 DIAGNOSIS — E119 Type 2 diabetes mellitus without complications: Secondary | ICD-10-CM | POA: Insufficient documentation

## 2014-04-16 DIAGNOSIS — Z713 Dietary counseling and surveillance: Secondary | ICD-10-CM | POA: Diagnosis not present

## 2014-04-16 DIAGNOSIS — M940 Chondrocostal junction syndrome [Tietze]: Secondary | ICD-10-CM

## 2014-04-16 DIAGNOSIS — Z79899 Other long term (current) drug therapy: Secondary | ICD-10-CM

## 2014-04-16 LAB — COMPREHENSIVE METABOLIC PANEL
ALT: 10 U/L (ref 0–35)
AST: 13 U/L (ref 0–37)
Albumin: 3.8 g/dL (ref 3.5–5.2)
Alkaline Phosphatase: 76 U/L (ref 39–117)
BUN: 12 mg/dL (ref 6–23)
CO2: 29 mEq/L (ref 19–32)
Calcium: 8.9 mg/dL (ref 8.4–10.5)
Chloride: 100 mEq/L (ref 96–112)
Creat: 0.57 mg/dL (ref 0.50–1.10)
Glucose, Bld: 122 mg/dL — ABNORMAL HIGH (ref 70–99)
Potassium: 3.9 mEq/L (ref 3.5–5.3)
Sodium: 138 mEq/L (ref 135–145)
Total Bilirubin: 0.4 mg/dL (ref 0.2–1.2)
Total Protein: 7 g/dL (ref 6.0–8.3)

## 2014-04-16 LAB — CBC
HCT: 38.4 % (ref 36.0–46.0)
Hemoglobin: 12.2 g/dL (ref 12.0–15.0)
MCH: 25.8 pg — ABNORMAL LOW (ref 26.0–34.0)
MCHC: 31.8 g/dL (ref 30.0–36.0)
MCV: 81.4 fL (ref 78.0–100.0)
MPV: 8.7 fL (ref 8.6–12.4)
Platelets: 278 10*3/uL (ref 150–400)
RBC: 4.72 MIL/uL (ref 3.87–5.11)
RDW: 13.6 % (ref 11.5–15.5)
WBC: 5 10*3/uL (ref 4.0–10.5)

## 2014-04-16 MED ORDER — PYRIDOSTIGMINE BROMIDE 60 MG PO TABS: 60.0000 mg | ORAL_TABLET | Freq: Three times a day (TID) | ORAL | Status: DC

## 2014-04-16 MED ORDER — PREDNISONE 20 MG PO TABS: 20.0000 mg | ORAL_TABLET | ORAL | Status: DC

## 2014-04-16 MED ORDER — AZATHIOPRINE 50 MG PO TABS: 150.0000 mg | ORAL_TABLET | Freq: Every day | ORAL | Status: DC

## 2014-04-16 MED ORDER — GABAPENTIN 300 MG PO CAPS: ORAL_CAPSULE | ORAL | Status: DC

## 2014-04-16 NOTE — Progress Notes (Signed)
Medical Nutrition Therapy:  Appt start time: 925 end time: 950  Follow up:  Naja returns having gained 1.5 lbs of fat since last visit. She states that she was put on a medicine that made her eat more. Has been craving fried foods.   Wt Readings from Last 3 Encounters:  04/16/14 205 lb 8 oz (93.214 kg)  03/10/14 203 lb (92.08 kg)  03/09/14 203 lb 12.8 oz (92.443 kg)   Ht Readings from Last 3 Encounters:  04/16/14 5\' 6"  (1.676 m)  03/10/14 5\' 6"  (1.676 m)  03/09/14 5\' 6"  (1.676 m)   There is no weight on file to calculate BMI. @BMIFA @ Normalized weight-for-age data available only for age 45 to 44 years. Normalized stature-for-age data available only for age 45 to 70 years.   TANITA  BODY COMP RESULTS  Fat Mass (lbs) 100.5 102 97.5 99  % Fat Mass 48.3 48.7 48 48.4   Fat Free Mass (lbs) 106.0 107 105.5 105.5    MEDICATIONS: See list   DIETARY INTAKE:     24-hr recall:  Breakfast: eggs, [place] bacon, and Cheerios Snack: 4 fig newtons, reduced fat cheese Lunch: soup with 3-4 crackers OR potato casserole Snack: fig newtons and string cheese or graham crackers with peanut butter Dinner: [place] with potatoes and kale and onions  Beverages: unsweetened almond milk with protein powder, water  Usual physical activity: None  Estimated energy needs: 1600 calories 180 g carbohydrates  Progress Towards Goal(s):  Some progress.   Nutritional Diagnosis:  [place]-2.2 Altered nutrition-related laboratory As related to history of excessive energy intake and lack of exercise. As evidenced by elevated HgbA1c (6.2%) and recent weight gain.    Intervention:  Nutrition counseling provided.  Samples provided and patient instructed on proper use: Boost Glucose Control (chocolate - qty 3) Lot#: [id number] Exp: 12/2014  Handouts provided: meal planning card and food log template   Monitoring/Evaluation:  Dietary intake, exercise, blood glucose, and body weight in 1 month(s).
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
## 2014-04-16 NOTE — Patient Instructions (Signed)
We will be making a few changes to your medications.  Please take as follows:  1.  Stop taking prednisone 10mg  every day.  We will be switching it to prednisone 20mg  on Monday, Wednesday, and Thursday only.  Do no take prednisone on the other days of the week.  2.  Increase gabapentin as follows for your feet pain:    AM  Afternoon  PM  Day 1-3 300mg   300mg    600mg  (2 tab)  Day 4-6 600mg  (2 tab) 300mg    600mg  (2 tab)  Day 6-10 600mg   600mg    600mg  and continue thereafter  3.  Continue imuran 150mg  daily and mestinon 60mg  three times daily  4.  Check blood work today  5.  Monitor sugars closely as we are adjusting your prednisone  6.  Return to clinic in 2 months

## 2014-04-16 NOTE — Patient Instructions (Addendum)
-  Limit carbohydrates and fried foods -Healthy snacks to keep on hand: low fat cheese, protein bars, fig newtons -Pay attention to serving sizes

## 2014-04-17 ENCOUNTER — Encounter (HOSPITAL_COMMUNITY): Payer: Self-pay | Admitting: Emergency Medicine

## 2014-04-17 ENCOUNTER — Emergency Department (HOSPITAL_COMMUNITY): Payer: Medicare HMO

## 2014-04-17 ENCOUNTER — Emergency Department (HOSPITAL_COMMUNITY)
Admission: EM | Admit: 2014-04-17 | Discharge: 2014-04-17 | Disposition: A | Discharge status: Home / Self Care | Payer: Medicare HMO | Attending: Emergency Medicine | Admitting: Emergency Medicine

## 2014-04-17 DIAGNOSIS — R0789 Other chest pain: Secondary | ICD-10-CM | POA: Diagnosis not present

## 2014-04-17 DIAGNOSIS — I10 Essential (primary) hypertension: Secondary | ICD-10-CM | POA: Insufficient documentation

## 2014-04-17 DIAGNOSIS — E119 Type 2 diabetes mellitus without complications: Secondary | ICD-10-CM | POA: Insufficient documentation

## 2014-04-17 DIAGNOSIS — R110 Nausea: Secondary | ICD-10-CM | POA: Diagnosis not present

## 2014-04-17 DIAGNOSIS — J45901 Unspecified asthma with (acute) exacerbation: Secondary | ICD-10-CM | POA: Diagnosis not present

## 2014-04-17 DIAGNOSIS — G8929 Other chronic pain: Secondary | ICD-10-CM

## 2014-04-17 DIAGNOSIS — R05 Cough: Secondary | ICD-10-CM | POA: Diagnosis not present

## 2014-04-17 DIAGNOSIS — Z8701 Personal history of pneumonia (recurrent): Secondary | ICD-10-CM | POA: Insufficient documentation

## 2014-04-17 DIAGNOSIS — F319 Bipolar disorder, unspecified: Secondary | ICD-10-CM | POA: Diagnosis not present

## 2014-04-17 DIAGNOSIS — Z7951 Long term (current) use of inhaled steroids: Secondary | ICD-10-CM | POA: Insufficient documentation

## 2014-04-17 DIAGNOSIS — G7000 Myasthenia gravis without (acute) exacerbation: Secondary | ICD-10-CM | POA: Insufficient documentation

## 2014-04-17 DIAGNOSIS — Z8619 Personal history of other infectious and parasitic diseases: Secondary | ICD-10-CM | POA: Insufficient documentation

## 2014-04-17 DIAGNOSIS — Z79899 Other long term (current) drug therapy: Secondary | ICD-10-CM | POA: Diagnosis not present

## 2014-04-17 DIAGNOSIS — R079 Chest pain, unspecified: Secondary | ICD-10-CM | POA: Diagnosis present

## 2014-04-17 DIAGNOSIS — Z8742 Personal history of other diseases of the female genital tract: Secondary | ICD-10-CM | POA: Insufficient documentation

## 2014-04-17 DIAGNOSIS — M797 Fibromyalgia: Secondary | ICD-10-CM | POA: Insufficient documentation

## 2014-04-17 LAB — BASIC METABOLIC PANEL
Anion gap: 4 — ABNORMAL LOW (ref 5–15)
BUN: 11 mg/dL (ref 6–23)
CO2: 31 mmol/L (ref 19–32)
Calcium: 9.1 mg/dL (ref 8.4–10.5)
Chloride: 101 mmol/L (ref 96–112)
Creatinine, Ser: 0.7 mg/dL (ref 0.50–1.10)
GFR calc Af Amer: >90 mL/min (ref >90)
GFR calc non Af Amer: >90 mL/min (ref >90)
Glucose, Bld: 137 mg/dL — ABNORMAL HIGH (ref 70–99)
Potassium: 3.4 mmol/L — ABNORMAL LOW (ref 3.5–5.1)
Sodium: 136 mmol/L (ref 135–145)

## 2014-04-17 LAB — CBC
HCT: 38.9 % (ref 36.0–46.0)
Hemoglobin: 12.8 g/dL (ref 12.0–15.0)
MCH: 26.8 pg (ref 26.0–34.0)
MCHC: 32.9 g/dL (ref 30.0–36.0)
MCV: 81.6 fL (ref 78.0–100.0)
Platelets: 260 10*3/uL (ref 150–400)
RBC: 4.77 MIL/uL (ref 3.87–5.11)
RDW: 13.3 % (ref 11.5–15.5)
WBC: 7.7 10*3/uL (ref 4.0–10.5)

## 2014-04-17 LAB — D-DIMER, QUANTITATIVE: D-Dimer, Quant: 0.48 ug/mL-FEU (ref 0.00–0.48)

## 2014-04-17 LAB — I-STAT TROPONIN, ED: Troponin i, poc: 0 ng/mL (ref 0.00–0.08)

## 2014-04-17 LAB — BRAIN NATRIURETIC PEPTIDE: B Natriuretic Peptide: 10.1 pg/mL (ref 0.0–100.0)

## 2014-04-17 MED ORDER — IBUPROFEN 800 MG PO TABS
800.0000 mg | ORAL_TABLET | Freq: Once | ORAL | Status: AC
  Administered 2014-04-17: 800 mg via ORAL
  Filled 2014-04-17: qty 1

## 2014-04-17 MED ORDER — OXYCODONE-ACETAMINOPHEN 5-325 MG PO TABS
2.0000 | ORAL_TABLET | Freq: Once | ORAL | Status: AC
  Administered 2014-04-17: 2 via ORAL
  Filled 2014-04-17: qty 2

## 2014-04-17 MED ORDER — ONDANSETRON 4 MG PO TBDP
4.0000 mg | ORAL_TABLET | Freq: Once | ORAL | Status: AC
  Administered 2014-04-17: 4 mg via ORAL
  Filled 2014-04-17: qty 1

## 2014-04-17 NOTE — ED Notes (Signed)
Pt. reports central chest pain with SOB , cough , diaphoresis and nausea onset Monday this week .
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
## 2014-04-17 NOTE — Discharge Instructions (Signed)
Chest Pain (Nonspecific) Jade Mathis, you were seen today for chest pain.  You need to follow-up with her primary care physician within 3 days for continued management. If symptoms worsen come back to emergency department immediately. Thank you. It is often hard to give a diagnosis for the cause of chest pain. There is always a chance that your pain could be related to something serious, such as a heart attack or a blood clot in the lungs. You need to follow up with your doctor. HOME CARE  If antibiotic medicine was given, take it as directed by your doctor. Finish the medicine even if you start to feel better.  For the next few days, avoid activities that bring on chest pain. Continue physical activities as told by your doctor.  Do not use any tobacco products. This includes cigarettes, chewing tobacco, and e-cigarettes.  Avoid drinking alcohol.  Only take medicine as told by your doctor.  Follow your doctor's suggestions for more testing if your chest pain does not go away.  Keep all doctor visits you made. GET HELP IF:  Your chest pain does not go away, even after treatment.  You have a rash with blisters on your chest.  You have a fever. GET HELP RIGHT AWAY IF:   You have more pain or pain that spreads to your arm, neck, jaw, back, or belly (abdomen).  You have shortness of breath.  You cough more than usual or cough up blood.  You have very bad back or belly pain.  You feel sick to your stomach (nauseous) or throw up (vomit).  You have very bad weakness.  You pass out (faint).  You have chills. This is an emergency. Do not wait to see if the problems will go away. Call your local emergency services (911 in [place] Do not drive yourself to the hospital. MAKE SURE YOU:   Understand these instructions.  Will watch your condition.  Will get help right away if you are not doing well or get worse. Document Released: 08/23/2007 Document Revised: 03/11/2013 Document  Reviewed: 08/23/2007 [facility] Patient Information 2015 [place], [place]. This information is not intended to replace advice given to you by your health care provider. Make sure you discuss any questions you have with your health care provider.

## 2014-04-21 ENCOUNTER — Telehealth: Payer: Self-pay | Admitting: *Deleted

## 2014-04-21 MED ORDER — PREDNISONE 10 MG PO TABS: 10.0000 mg | ORAL_TABLET | Freq: Every day | ORAL | Status: DC

## 2014-04-21 NOTE — Telephone Encounter (Signed)
Called patient back.  She continues to complain of chest pain for which she was evaluated at the emergency dept.  She also feels tightness of her throat, difficulty swallowing, and generalized pain.  She mentioned these same complaints in the office when I saw her last week and at that visit, her neurological exam showed intact motor strength.  She has been on prednisone 10mg  for a long time and we agreed to taper to 20mg  every other day.  I do not think her myasthenia is getting worse because to change in her steroids is unlikely to be seen until at least a week later, but because it is very difficult to determine true weakness from other somatic complaints, I will restart her on prednisone 10mg  daily.  For ongoing problems with chest pain, she needs to f/u with his PCP.  Jaylun Fleener K. Posey Pronto, DO

## 2014-04-21 NOTE — Telephone Encounter (Signed)
Patient would like to speak with you see insist that her messages are not being delivered correctly and she does not want to speak with anyone other than you

## 2014-04-22 ENCOUNTER — Other Ambulatory Visit: Payer: Self-pay | Admitting: Endocrinology

## 2014-04-23 ENCOUNTER — Ambulatory Visit (INDEPENDENT_AMBULATORY_CARE_PROVIDER_SITE_OTHER): Payer: Medicare HMO | Admitting: Podiatry

## 2014-04-23 ENCOUNTER — Encounter: Payer: Self-pay | Admitting: Podiatry

## 2014-04-23 DIAGNOSIS — R2689 Other abnormalities of gait and mobility: Secondary | ICD-10-CM | POA: Diagnosis not present

## 2014-04-23 DIAGNOSIS — Q6650 Congenital pes planus, unspecified foot: Secondary | ICD-10-CM | POA: Diagnosis not present

## 2014-04-23 DIAGNOSIS — M79672 Pain in left foot: Secondary | ICD-10-CM | POA: Diagnosis not present

## 2014-04-23 DIAGNOSIS — E0840 Diabetes mellitus due to underlying condition with diabetic neuropathy, unspecified: Secondary | ICD-10-CM

## 2014-04-23 DIAGNOSIS — M79671 Pain in right foot: Secondary | ICD-10-CM | POA: Diagnosis not present

## 2014-04-23 NOTE — Progress Notes (Signed)
She presents today with a chief complaint of pain in her feet and radiating pain into her legs and thighs and back. She is currently being treated with narcotics only from a local pain clinic. She states that is becoming so bad that she can hardly walk and her wife style is compromised.  Objective: Vital signs are stable she is alert and oriented 3 her hemoglobin A1c is 6.5 pulses are palpable bilateral however her feet are exquisitely tender on palpation and range of motion. Severe pes planus is noted bilateral.  Assessment: Severe diabetic peripheral neuropathy however we can't state that all of her pain is truly neuropathic in nature I do feel that some of it is more than likely associated with radiculopathy or some type of spinal stenosis.  Plan: At this point we gave her 2 night splints which decreased the pain secondary to the position. I encouraged her to contact her primary care provider and request a different pain clinic secondary to know other treatment other than oral narcotics. Follow up with her as needed.

## 2014-04-27 LAB — HM DIABETES EYE EXAM

## 2014-05-08 ENCOUNTER — Encounter: Payer: Self-pay | Admitting: *Deleted

## 2014-05-12 ENCOUNTER — Ambulatory Visit: Payer: Medicare HMO | Attending: Orthopedic Surgery

## 2014-05-12 DIAGNOSIS — R51 Headache: Secondary | ICD-10-CM | POA: Insufficient documentation

## 2014-05-12 DIAGNOSIS — R29898 Other symptoms and signs involving the musculoskeletal system: Secondary | ICD-10-CM | POA: Diagnosis not present

## 2014-05-12 DIAGNOSIS — M436 Torticollis: Secondary | ICD-10-CM

## 2014-05-12 DIAGNOSIS — R293 Abnormal posture: Secondary | ICD-10-CM | POA: Diagnosis not present

## 2014-05-12 DIAGNOSIS — M542 Cervicalgia: Secondary | ICD-10-CM | POA: Diagnosis not present

## 2014-05-12 DIAGNOSIS — M62838 Other muscle spasm: Secondary | ICD-10-CM | POA: Diagnosis not present

## 2014-05-12 DIAGNOSIS — R519 Headache, unspecified: Secondary | ICD-10-CM

## 2014-05-12 NOTE — Patient Instructions (Signed)
Asked pt to use pillow support for arms when sitting and be aware of relaxing her shoulders.  Also asked her to be up and walk for 10 min and sit for 10 min each hour.
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
## 2014-05-13 ENCOUNTER — Ambulatory Visit: Payer: Medicare HMO | Admitting: Physical Therapy

## 2014-05-13 DIAGNOSIS — M62838 Other muscle spasm: Secondary | ICD-10-CM

## 2014-05-13 DIAGNOSIS — R51 Headache: Secondary | ICD-10-CM | POA: Diagnosis not present

## 2014-05-13 DIAGNOSIS — R293 Abnormal posture: Secondary | ICD-10-CM

## 2014-05-13 DIAGNOSIS — R29898 Other symptoms and signs involving the musculoskeletal system: Secondary | ICD-10-CM

## 2014-05-13 DIAGNOSIS — M542 Cervicalgia: Secondary | ICD-10-CM

## 2014-05-13 DIAGNOSIS — R519 Headache, unspecified: Secondary | ICD-10-CM

## 2014-05-13 DIAGNOSIS — M436 Torticollis: Secondary | ICD-10-CM

## 2014-05-13 NOTE — Patient Instructions (Signed)
  Flexibility: Upper Trapezius Stretch   Gently grasp right side of head while reaching behind back with other hand. Tilt head away until a gentle stretch is felt. Hold __20-30__ seconds. Repeat __2-3__ times per set. Do __1__ sets per session. Do __2__ sessions per day.  http://orth.exer.us/340   Levator Stretch   Grasp seat or sit on hand on side to be stretched. Turn head toward other side and look down. Use hand on head to gently stretch neck in that position. Hold ___30_ seconds. Repeat on other side. Repeat ___2-3_ times. Do __2__ sessions per day.  http://gt2.exer.us/30   Scapular Retraction (Standing)   With arms at sides, pinch shoulder blades together. Repeat ___10_ times per set. Do ___1_ sets per session. Do _2__ sessions per day.  http://orth.exer.us/944   Flexibility: Neck Retraction   Pull head straight back, keeping eyes and jaw level. Repeat ___10_ times per set. Do _1___ sets per session. Do ___2_ sessions per day.  http://orth.exer.us/344   Posture - Sitting   Sit upright, head facing forward. Try using a roll to support lower back. Keep shoulders relaxed, and avoid rounded back. Keep hips level with knees. Avoid crossing legs for long periods.   Flexibility: Corner Stretch   Standing in corner with hands just above shoulder level and feet _12___ inches from corner, lean forward until a comfortable stretch is felt across chest. Hold __20-30__ seconds. Repeat ___2-3_ times per set. Do _1___ sets per session. Do __2   sessions per day.  http://orth.exer.us/342   Copyright  VHI. All rights reserved.    Head Press With Lamar chin SLIGHTLY toward chest, keep mouth closed. Feel weight on back of head. Increase weight by pressing head down. Hold _5__ seconds. Relax. Repeat 10___ times. Surface: floor  OR bed with towel roll Copyright  VHI. All rights reserved.
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
## 2014-05-18 ENCOUNTER — Ambulatory Visit: Payer: Medicare HMO | Admitting: Rehabilitation

## 2014-05-18 DIAGNOSIS — M542 Cervicalgia: Secondary | ICD-10-CM

## 2014-05-18 DIAGNOSIS — R51 Headache: Principal | ICD-10-CM

## 2014-05-18 DIAGNOSIS — M436 Torticollis: Secondary | ICD-10-CM

## 2014-05-18 DIAGNOSIS — R293 Abnormal posture: Secondary | ICD-10-CM

## 2014-05-18 DIAGNOSIS — M62838 Other muscle spasm: Secondary | ICD-10-CM

## 2014-05-18 DIAGNOSIS — R519 Headache, unspecified: Secondary | ICD-10-CM

## 2014-05-18 DIAGNOSIS — R29898 Other symptoms and signs involving the musculoskeletal system: Secondary | ICD-10-CM
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
## 2014-05-19 ENCOUNTER — Encounter: Payer: Medicare HMO | Admitting: Rehabilitation

## 2014-05-20 ENCOUNTER — Ambulatory Visit: Payer: Medicare HMO | Attending: Orthopedic Surgery | Admitting: Physical Therapy

## 2014-05-20 ENCOUNTER — Encounter: Payer: Medicare HMO | Attending: Endocrinology | Admitting: Dietician

## 2014-05-20 VITALS — Ht 66.0 in | Wt 197.0 lb

## 2014-05-20 DIAGNOSIS — M62838 Other muscle spasm: Secondary | ICD-10-CM | POA: Insufficient documentation

## 2014-05-20 DIAGNOSIS — R293 Abnormal posture: Secondary | ICD-10-CM

## 2014-05-20 DIAGNOSIS — Z713 Dietary counseling and surveillance: Secondary | ICD-10-CM | POA: Diagnosis not present

## 2014-05-20 DIAGNOSIS — R51 Headache: Secondary | ICD-10-CM | POA: Insufficient documentation

## 2014-05-20 DIAGNOSIS — R29898 Other symptoms and signs involving the musculoskeletal system: Secondary | ICD-10-CM | POA: Diagnosis not present

## 2014-05-20 DIAGNOSIS — E669 Obesity, unspecified: Secondary | ICD-10-CM

## 2014-05-20 DIAGNOSIS — E119 Type 2 diabetes mellitus without complications: Secondary | ICD-10-CM | POA: Insufficient documentation

## 2014-05-20 DIAGNOSIS — Z6833 Body mass index (BMI) 33.0-33.9, adult: Secondary | ICD-10-CM | POA: Diagnosis not present

## 2014-05-20 DIAGNOSIS — M436 Torticollis: Secondary | ICD-10-CM | POA: Diagnosis not present

## 2014-05-20 DIAGNOSIS — M542 Cervicalgia: Secondary | ICD-10-CM

## 2014-05-20 DIAGNOSIS — R519 Headache, unspecified: Secondary | ICD-10-CM
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
## 2014-05-20 NOTE — Progress Notes (Signed)
Medical Nutrition Therapy:  Appt start time: 910 end time: 930  Follow up:  Jade Mathis returns having lost 7 pounds of fat since last visit. Has lost a total of 14 pounds in the past 6 months.  Has been logging foods.  Wt Readings from Last 3 Encounters:  05/20/14 197 lb (89.359 kg)  04/16/14 204 lb 8 oz (92.761 kg)  04/16/14 205 lb 8 oz (93.214 kg)   Ht Readings from Last 3 Encounters:  04/16/14 5\' 6"  (1.676 m)  03/10/14 5\' 6"  (1.676 m)  03/09/14 5\' 6"  (1.676 m)   There is no weight on file to calculate BMI. @BMIFA @ Normalized weight-for-age data available only for age 12 to 12 years. Normalized stature-for-age data available only for age 12 to 66 years.   TANITA  BODY COMP RESULTS  Fat Mass (lbs) 100.5 102 97.5 99 92  % Fat Mass 48.3 48.7 48 48.4 46.7   Fat Free Mass (lbs) 106.0 107 105.5 105.5 105    MEDICATIONS: See list   DIETARY INTAKE:     24-hr recall:  Breakfast: eggs, [place] bacon, and Cheerios Snack: 4 fig newtons, reduced fat cheese Lunch: soup with 3-4 crackers OR potato casserole Snack: fig newtons and string cheese or graham crackers with peanut butter Dinner: [place] with potatoes and kale and onions  Beverages: unsweetened almond milk with protein powder, water  Usual physical activity: None  Estimated energy needs: 1600 calories 180 g carbohydrates  Progress Towards Goal(s):  Some progress.   Nutritional Diagnosis:  [place]-2.2 Altered nutrition-related laboratory As related to history of excessive energy intake and lack of exercise. As evidenced by elevated HgbA1c (6.2%) and recent weight gain.    Intervention:  Nutrition counseling provided.  Samples provided and patient instructed on proper use: Premier protein shake (chocolate - qty 3) Lot#: [id number] Exp: 11/2014   Handouts provided: Type 2 diabetes support group   Monitoring/Evaluation:  Dietary intake, exercise, blood glucose, and body weight in 1 month(s).

## 2014-05-20 NOTE — Patient Instructions (Addendum)
Scapular Retraction (Standing)   With arms at sides, pinch shoulder blades together. Repeat __10__ times per set. Do ___2_ sets per session. Do ____2 sessions per day.  http://orth.exer.us/945   Copyright  VHI. All rights reserved.    Closed Chain: Shoulder Flexion / Extension - on Wall   Hands on wall, step backward. Return. Stepping causes shoulder flexion and extension Do __5_ times, each foot, __2_ times per day.  http://ss.exer.us/265   Copyright  VHI. All rights reserved.  Levator Scapula Stretch, Sitting   Sit, one hand tucked under hip on side to be stretched, other hand over top of head. Turn head toward other side and look down. Use hand on head to gently stretch neck in that position. Hold __20-30_ seconds. Repeat _2-3__ times per session. Do 2___ sessions per day.  Copyright  VHI. All rights reserved.

## 2014-05-21 ENCOUNTER — Other Ambulatory Visit: Payer: Self-pay | Admitting: Endocrinology

## 2014-05-22 ENCOUNTER — Ambulatory Visit: Payer: Medicare HMO | Admitting: Physical Therapy

## 2014-05-22 DIAGNOSIS — R519 Headache, unspecified: Secondary | ICD-10-CM

## 2014-05-22 DIAGNOSIS — R29898 Other symptoms and signs involving the musculoskeletal system: Secondary | ICD-10-CM

## 2014-05-22 DIAGNOSIS — R51 Headache: Principal | ICD-10-CM

## 2014-05-22 DIAGNOSIS — R293 Abnormal posture: Secondary | ICD-10-CM

## 2014-05-22 DIAGNOSIS — M436 Torticollis: Secondary | ICD-10-CM
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
## 2014-05-25 ENCOUNTER — Ambulatory Visit: Payer: Medicare HMO | Admitting: Physical Therapy

## 2014-05-25 DIAGNOSIS — R293 Abnormal posture: Secondary | ICD-10-CM

## 2014-05-25 DIAGNOSIS — R29898 Other symptoms and signs involving the musculoskeletal system: Secondary | ICD-10-CM

## 2014-05-25 DIAGNOSIS — R51 Headache: Secondary | ICD-10-CM | POA: Diagnosis not present

## 2014-05-25 DIAGNOSIS — M436 Torticollis: Secondary | ICD-10-CM

## 2014-05-25 DIAGNOSIS — M542 Cervicalgia: Secondary | ICD-10-CM
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
## 2014-05-25 NOTE — Patient Instructions (Signed)
Biceps   Sit on ball or firm surface, or stand. Hold tubing or __1__ pound weight in right hand. Bend elbow and bring hand toward shoulder. Keep head and back straight. Hold __3__ seconds. Repeat __10__ times. Do __2__ sessions per day. CAUTION: Move slowly.  Copyright  VHI. All rights reserved.  Resisted External Rotation: in Neutral - Bilateral   Sit or stand, tubing in both hands, elbows at sides, bent to 90, forearms forward. Pinch shoulder blades together and rotate forearms out. Keep elbows at sides. Repeat _10___ times per set. Do __2_ sets per session. Do __2 sessions per day.  http://orth.exer.us/967   Copyright  VHI. All rights reserved.  Resisted Horizontal Abduction: Bilateral   Sit or stand, tubing in both hands, arms out in front. Keeping arms straight, pinch shoulder blades together and stretch arms out. Repeat __10__ times per set. Do __2__ sets per session. Do _2__ sessions per day.  http://orth.exer.us/969   Copyright  VHI. All rights reserved.

## 2014-05-26 ENCOUNTER — Ambulatory Visit: Payer: Medicare HMO | Admitting: Physical Therapy

## 2014-05-26 DIAGNOSIS — R51 Headache: Secondary | ICD-10-CM

## 2014-05-26 DIAGNOSIS — M436 Torticollis: Secondary | ICD-10-CM

## 2014-05-26 DIAGNOSIS — R293 Abnormal posture: Secondary | ICD-10-CM

## 2014-05-26 DIAGNOSIS — R519 Headache, unspecified: Secondary | ICD-10-CM

## 2014-05-26 DIAGNOSIS — M542 Cervicalgia: Secondary | ICD-10-CM

## 2014-05-26 DIAGNOSIS — R29898 Other symptoms and signs involving the musculoskeletal system: Secondary | ICD-10-CM
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
## 2014-05-26 NOTE — Patient Instructions (Signed)
Gave patient info on Diabetes study and guidelines for participation.

## 2014-05-27 ENCOUNTER — Ambulatory Visit: Payer: Medicare HMO

## 2014-05-28 ENCOUNTER — Ambulatory Visit: Payer: Medicare HMO | Admitting: Rehabilitation

## 2014-06-02 ENCOUNTER — Ambulatory Visit: Payer: Medicare HMO

## 2014-06-03 ENCOUNTER — Other Ambulatory Visit: Payer: Self-pay | Admitting: Neurology

## 2014-06-03 NOTE — Telephone Encounter (Signed)
Rx sent 

## 2014-06-04 ENCOUNTER — Ambulatory Visit: Payer: Medicare HMO | Admitting: Rehabilitation

## 2014-06-04 ENCOUNTER — Other Ambulatory Visit (INDEPENDENT_AMBULATORY_CARE_PROVIDER_SITE_OTHER): Payer: Medicare HMO

## 2014-06-04 DIAGNOSIS — E119 Type 2 diabetes mellitus without complications: Secondary | ICD-10-CM

## 2014-06-04 DIAGNOSIS — R51 Headache: Secondary | ICD-10-CM

## 2014-06-04 DIAGNOSIS — R519 Headache, unspecified: Secondary | ICD-10-CM

## 2014-06-04 DIAGNOSIS — M436 Torticollis: Secondary | ICD-10-CM

## 2014-06-04 DIAGNOSIS — M542 Cervicalgia: Secondary | ICD-10-CM

## 2014-06-04 DIAGNOSIS — R29898 Other symptoms and signs involving the musculoskeletal system: Secondary | ICD-10-CM

## 2014-06-04 DIAGNOSIS — E038 Other specified hypothyroidism: Secondary | ICD-10-CM

## 2014-06-04 DIAGNOSIS — M62838 Other muscle spasm: Secondary | ICD-10-CM

## 2014-06-04 DIAGNOSIS — R293 Abnormal posture: Secondary | ICD-10-CM

## 2014-06-04 LAB — COMPREHENSIVE METABOLIC PANEL
ALT: 8 U/L (ref 0–35)
AST: 13 U/L (ref 0–37)
Albumin: 3.9 g/dL (ref 3.5–5.2)
Alkaline Phosphatase: 73 U/L (ref 39–117)
BUN: 15 mg/dL (ref 6–23)
CO2: 28 mEq/L (ref 19–32)
Calcium: 9.3 mg/dL (ref 8.4–10.5)
Chloride: 104 mEq/L (ref 96–112)
Creatinine, Ser: 0.73 mg/dL (ref 0.40–1.20)
GFR: 113.98 mL/min (ref >60.00)
Glucose, Bld: 136 mg/dL — ABNORMAL HIGH (ref 70–99)
Potassium: 3.9 mEq/L (ref 3.5–5.1)
Sodium: 136 mEq/L (ref 135–145)
Total Bilirubin: 0.3 mg/dL (ref 0.2–1.2)
Total Protein: 7.3 g/dL (ref 6.0–8.3)

## 2014-06-04 LAB — HEMOGLOBIN A1C: Hgb A1c MFr Bld: 6.3 % (ref 4.6–6.5)

## 2014-06-04 LAB — TSH: TSH: 0.45 u[IU]/mL (ref 0.35–4.50)
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
## 2014-06-05 ENCOUNTER — Ambulatory Visit: Payer: Medicare HMO | Admitting: Physical Therapy

## 2014-06-05 DIAGNOSIS — R29898 Other symptoms and signs involving the musculoskeletal system: Secondary | ICD-10-CM

## 2014-06-05 DIAGNOSIS — M436 Torticollis: Secondary | ICD-10-CM

## 2014-06-05 DIAGNOSIS — R293 Abnormal posture: Secondary | ICD-10-CM

## 2014-06-05 DIAGNOSIS — R51 Headache: Secondary | ICD-10-CM | POA: Diagnosis not present

## 2014-06-05 DIAGNOSIS — M542 Cervicalgia: Secondary | ICD-10-CM

## 2014-06-05 DIAGNOSIS — R519 Headache, unspecified: Secondary | ICD-10-CM
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
## 2014-06-08 ENCOUNTER — Ambulatory Visit: Payer: Medicare HMO | Admitting: Physical Therapy

## 2014-06-09 ENCOUNTER — Other Ambulatory Visit: Payer: Self-pay | Admitting: Endocrinology

## 2014-06-09 ENCOUNTER — Ambulatory Visit: Payer: Medicare HMO | Admitting: Endocrinology

## 2014-06-09 ENCOUNTER — Ambulatory Visit: Payer: Medicare HMO | Admitting: *Deleted

## 2014-06-09 DIAGNOSIS — Q6650 Congenital pes planus, unspecified foot: Secondary | ICD-10-CM

## 2014-06-09 NOTE — Patient Instructions (Signed)

## 2014-06-09 NOTE — Progress Notes (Signed)
Patient ID: Jade Mathis, female   DOB: 07-30-1975, 39 y.o.   MRN: [id number]  PATIENT CAME IN TO PICK UP DIABETIC SHOES PT REQUESTED NEW BALANCE 813 BROWN IN MEN'S PT MEASUREMENT TRANSLATED TO A MEN'S 9 SHOES ARE TRIED ON AND ARE TOO SMALL WE MEASURED ON THE MEN'S SCALE WHICH SHOES SHE SHOULD BE A MEN'S 10 WE WILL REORDER AND CALL WHEN THEY ARRIVE

## 2014-06-10 ENCOUNTER — Ambulatory Visit: Payer: Medicare HMO | Admitting: Physical Therapy

## 2014-06-12 ENCOUNTER — Ambulatory Visit: Payer: Medicare HMO | Admitting: Endocrinology

## 2014-06-15 ENCOUNTER — Ambulatory Visit (INDEPENDENT_AMBULATORY_CARE_PROVIDER_SITE_OTHER): Payer: Medicare HMO | Admitting: Neurology

## 2014-06-15 ENCOUNTER — Encounter: Payer: Self-pay | Admitting: Neurology

## 2014-06-15 ENCOUNTER — Other Ambulatory Visit: Payer: Self-pay | Admitting: *Deleted

## 2014-06-15 VITALS — BP 130/86 | HR 86 | Ht 66.0 in | Wt 199.5 lb

## 2014-06-15 DIAGNOSIS — G7000 Myasthenia gravis without (acute) exacerbation: Secondary | ICD-10-CM

## 2014-06-15 DIAGNOSIS — R269 Unspecified abnormalities of gait and mobility: Secondary | ICD-10-CM

## 2014-06-15 MED ORDER — ENSURE PO LIQD: 2.0000 | Freq: Two times a day (BID) | ORAL | Status: DC

## 2014-06-15 MED ORDER — AMBULATORY NON FORMULARY MEDICATION: 1.0000 | Freq: Every day | Status: DC
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
## 2014-06-15 NOTE — Patient Instructions (Signed)
Continue your medications as your are taking them, including prednisone 20mg  every other day. Return to clinic in 4 months

## 2014-06-16 ENCOUNTER — Ambulatory Visit: Payer: Medicare HMO | Admitting: Physical Therapy

## 2014-06-17 ENCOUNTER — Encounter: Payer: Medicare HMO | Admitting: Physical Therapy

## 2014-06-18 ENCOUNTER — Encounter: Payer: Medicare HMO | Admitting: Dietician

## 2014-06-18 VITALS — Wt 193.5 lb

## 2014-06-18 DIAGNOSIS — E118 Type 2 diabetes mellitus with unspecified complications: Secondary | ICD-10-CM

## 2014-06-18 NOTE — Patient Instructions (Signed)
[email or web address] (fill out the registration)

## 2014-06-18 NOTE — Progress Notes (Unsigned)
Medical Nutrition Therapy:  Appt start time: 835 end time:   Follow up:  January returns having lost 4 pounds. She has not been logging foods as much. Has lost 18 pounds in the last 6 months.   Wt Readings from Last 3 Encounters:  06/15/14 199 lb 8 oz (90.493 kg)  05/20/14 197 lb (89.359 kg)  04/16/14 204 lb 8 oz (92.761 kg)   Ht Readings from Last 3 Encounters:  06/15/14 5\' 6"  (1.676 m)  05/20/14 5\' 6"  (1.676 m)  04/16/14 5\' 6"  (1.676 m)   There is no weight on file to calculate BMI. @BMIFA @ Normalized weight-for-age data available only for age 86 to 63 years. Normalized stature-for-age data available only for age 86 to 63 years.   TANITA  BODY COMP RESULTS  Fat Mass (lbs) 100.5 102 97.5 99 92 91.5  % Fat Mass 48.3 48.7 48 48.4 46.7 47.2   Fat Free Mass (lbs) 106.0 107 105.5 105.5 105 102    MEDICATIONS: See list   DIETARY INTAKE:     24-hr recall:  Breakfast: protein drink Snack: nuts and dried fruit Lunch: Honey Nut Cheerios cereal with unsweet almond milk, 1/2 banana, tsp honey  Snack: string cheese and Nutrigrain bar OR applesauce with cottage cheese Dinner: [place] with potatoes and kale and onions  Beverages: unsweetened almond milk with protein powder, water  Usual physical activity: None  Estimated energy needs: 1600 calories 180 g carbohydrates  Progress Towards Goal(s):  Some progress.   Nutritional Diagnosis:  -2.2 Altered nutrition-related laboratory As related to history of excessive energy intake and lack of exercise. As evidenced by elevated HgbA1c (6.2%) and recent weight gain.    Intervention:  Nutrition counseling provided.  Samples provided and patient instructed on proper use: Premier protein shake (chocolate - qty 3) Lot#: [id number] Exp: 11/2014   Handouts provided: Type 2 diabetes support group   Monitoring/Evaluation:  Dietary intake, exercise, blood glucose, and body weight in 1 month(s).

## 2014-06-18 NOTE — Progress Notes (Incomplete)
  Medical Nutrition Therapy: Appt Start time: 656 End time: 900  Follow up:  Jade Mathis returns having lost 4 pounds. She has not been logging foods as much. Has lost 18 pounds in the last 6 months.   Wt Readings from Last 3 Encounters:  06/18/14 193 lb 8 oz (87.771 kg)  06/15/14 199 lb 8 oz (90.493 kg)  05/20/14 197 lb (89.359 kg)   Ht Readings from Last 3 Encounters:  06/15/14 5\' 6"  (1.676 m)  05/20/14 5\' 6"  (1.676 m)  04/16/14 5\' 6"  (1.676 m)   There is no weight on file to calculate BMI. @BMIFA @ Normalized weight-for-age data available only for age 84 to 11 years. Normalized stature-for-age data available only for age 84 to 43 years.   TANITA  BODY COMP RESULTS  Fat Mass (lbs) 100.5 102 97.5 99 92 91.5  % Fat Mass 48.3 48.7 48 48.4 46.7 47.2   Fat Free Mass (lbs) 106.0 107 105.5 105.5 105 102    MEDICATIONS: See list   DIETARY INTAKE:     24-hr recall:  Breakfast: protein drink Snack: nuts and dried fruit Lunch: Honey Nut Cheerios cereal with unsweet almond milk, 1/2 banana, tsp honey  Snack: string cheese and Nutrigrain bar OR applesauce with cottage cheese Dinner: [place] with potatoes and kale and onions  Beverages: unsweetened almond milk with protein powder, water  Usual physical activity: None  Estimated energy needs: 1600 calories 180 g carbohydrates  Progress Towards Goal(s):  Some progress.   Nutritional Diagnosis:  [place]-2.2 Altered nutrition-related laboratory As related to history of excessive energy intake and lack of exercise. As evidenced by elevated HgbA1c (6.2%) and recent weight gain.    Intervention:  Nutrition counseling provided.  Samples provided and patient instructed on proper use: Premier protein shake (chocolate - qty 3) Lot#: [id number] (2) Exp: 11/2014  Lot#: [id number] (1) Exp: 01/2015   Handouts provided: MyPlate   Monitoring/Evaluation:  Dietary intake, exercise, blood glucose, and body weight in 1 month(s).

## 2014-06-22 ENCOUNTER — Other Ambulatory Visit (INDEPENDENT_AMBULATORY_CARE_PROVIDER_SITE_OTHER): Payer: Medicare HMO

## 2014-06-22 DIAGNOSIS — E038 Other specified hypothyroidism: Secondary | ICD-10-CM | POA: Diagnosis not present

## 2014-06-22 DIAGNOSIS — E119 Type 2 diabetes mellitus without complications: Secondary | ICD-10-CM

## 2014-06-22 LAB — COMPREHENSIVE METABOLIC PANEL
ALT: 8 U/L (ref 0–35)
AST: 12 U/L (ref 0–37)
Albumin: 3.8 g/dL (ref 3.5–5.2)
Alkaline Phosphatase: 79 U/L (ref 39–117)
BUN: 18 mg/dL (ref 6–23)
CO2: 26 mEq/L (ref 19–32)
Calcium: 9.4 mg/dL (ref 8.4–10.5)
Chloride: 105 mEq/L (ref 96–112)
Creatinine, Ser: 0.68 mg/dL (ref 0.40–1.20)
GFR: 123.67 mL/min (ref >60.00)
Glucose, Bld: 142 mg/dL — ABNORMAL HIGH (ref 70–99)
Potassium: 3.9 mEq/L (ref 3.5–5.1)
Sodium: 136 mEq/L (ref 135–145)
Total Bilirubin: 0.4 mg/dL (ref 0.2–1.2)
Total Protein: 7.4 g/dL (ref 6.0–8.3)

## 2014-06-22 LAB — HEMOGLOBIN A1C: Hgb A1c MFr Bld: 6.4 % (ref 4.6–6.5)

## 2014-06-22 LAB — TSH: TSH: 0.83 u[IU]/mL (ref 0.35–4.50)

## 2014-06-23 ENCOUNTER — Telehealth: Payer: Self-pay | Admitting: *Deleted

## 2014-06-23 NOTE — Telephone Encounter (Signed)
I do not think this is covered by insurance and I would recommend only one shake daily

## 2014-06-23 NOTE — Telephone Encounter (Signed)
Patient is requesting a new script for Glucerna shakes 2 bid so her insurance will cover it.  Please advise if okay to send?

## 2014-06-24 ENCOUNTER — Ambulatory Visit: Payer: Medicare Other | Attending: Orthopedic Surgery | Admitting: Physical Therapy

## 2014-06-24 DIAGNOSIS — M62838 Other muscle spasm: Secondary | ICD-10-CM | POA: Insufficient documentation

## 2014-06-24 DIAGNOSIS — M436 Torticollis: Secondary | ICD-10-CM | POA: Insufficient documentation

## 2014-06-24 DIAGNOSIS — M542 Cervicalgia: Secondary | ICD-10-CM | POA: Diagnosis not present

## 2014-06-24 DIAGNOSIS — R293 Abnormal posture: Secondary | ICD-10-CM | POA: Diagnosis not present

## 2014-06-24 DIAGNOSIS — R51 Headache: Secondary | ICD-10-CM | POA: Diagnosis not present

## 2014-06-24 DIAGNOSIS — R29898 Other symptoms and signs involving the musculoskeletal system: Secondary | ICD-10-CM | POA: Diagnosis not present

## 2014-06-24 DIAGNOSIS — R519 Headache, unspecified: Secondary | ICD-10-CM
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
## 2014-06-25 ENCOUNTER — Ambulatory Visit: Payer: Medicare Other | Admitting: Physical Therapy

## 2014-06-25 DIAGNOSIS — R51 Headache: Secondary | ICD-10-CM

## 2014-06-25 DIAGNOSIS — R519 Headache, unspecified: Secondary | ICD-10-CM

## 2014-06-25 DIAGNOSIS — R293 Abnormal posture: Secondary | ICD-10-CM

## 2014-06-25 DIAGNOSIS — M436 Torticollis: Secondary | ICD-10-CM

## 2014-06-25 DIAGNOSIS — M542 Cervicalgia: Secondary | ICD-10-CM
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
## 2014-06-26 ENCOUNTER — Ambulatory Visit (INDEPENDENT_AMBULATORY_CARE_PROVIDER_SITE_OTHER): Payer: Medicare HMO | Admitting: Endocrinology

## 2014-06-26 ENCOUNTER — Encounter: Payer: Self-pay | Admitting: Endocrinology

## 2014-06-26 VITALS — BP 110/79 | HR 87 | Temp 98.0°F | Wt 198.0 lb

## 2014-06-26 DIAGNOSIS — G629 Polyneuropathy, unspecified: Secondary | ICD-10-CM

## 2014-06-26 DIAGNOSIS — E7800 Pure hypercholesterolemia, unspecified: Secondary | ICD-10-CM

## 2014-06-26 DIAGNOSIS — E1142 Type 2 diabetes mellitus with diabetic polyneuropathy: Secondary | ICD-10-CM | POA: Diagnosis not present

## 2014-06-26 DIAGNOSIS — R51 Headache: Secondary | ICD-10-CM | POA: Diagnosis not present

## 2014-06-26 DIAGNOSIS — R630 Anorexia: Secondary | ICD-10-CM | POA: Diagnosis not present

## 2014-06-26 DIAGNOSIS — E119 Type 2 diabetes mellitus without complications: Secondary | ICD-10-CM

## 2014-06-26 DIAGNOSIS — E890 Postprocedural hypothyroidism: Secondary | ICD-10-CM | POA: Diagnosis not present

## 2014-06-26 MED ORDER — GLUCERNA SHAKE PO LIQD: 237.0000 mL | Freq: Three times a day (TID) | ORAL | Status: DC

## 2014-06-26 NOTE — Patient Instructions (Addendum)
Ask Psychiatry about Cymbalta  Get capsaicin cream 3 times daily on painful areas of feet  Please check blood sugars at least half the time about 2 hours after any meal and 3 times per week on waking up. Please bring blood sugar monitor to each visit. Recommended blood sugar levels about 2 hours after meal is 140-180 and on waking up 90-130
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
## 2014-06-26 NOTE — Progress Notes (Signed)
Pre visit review using our clinic review tool, if applicable. No additional management support is needed unless otherwise documented below in the visit note. 

## 2014-06-30 ENCOUNTER — Ambulatory Visit (INDEPENDENT_AMBULATORY_CARE_PROVIDER_SITE_OTHER): Payer: Medicare Other | Admitting: Podiatry

## 2014-06-30 DIAGNOSIS — E1140 Type 2 diabetes mellitus with diabetic neuropathy, unspecified: Secondary | ICD-10-CM | POA: Diagnosis not present

## 2014-06-30 DIAGNOSIS — E1159 Type 2 diabetes mellitus with other circulatory complications: Secondary | ICD-10-CM

## 2014-06-30 DIAGNOSIS — E1342 Other specified diabetes mellitus with diabetic polyneuropathy: Secondary | ICD-10-CM

## 2014-06-30 DIAGNOSIS — M2141 Flat foot [pes planus] (acquired), right foot: Secondary | ICD-10-CM | POA: Diagnosis not present

## 2014-06-30 DIAGNOSIS — M2142 Flat foot [pes planus] (acquired), left foot: Secondary | ICD-10-CM | POA: Diagnosis not present

## 2014-06-30 DIAGNOSIS — E0840 Diabetes mellitus due to underlying condition with diabetic neuropathy, unspecified: Secondary | ICD-10-CM

## 2014-06-30 DIAGNOSIS — Q6650 Congenital pes planus, unspecified foot: Secondary | ICD-10-CM

## 2014-06-30 NOTE — Progress Notes (Signed)
Patient ID: Jade Mathis, female   DOB: 07-Apr-1975, 39 y.o.   MRN: [id number] PATIENT PRESENTS FOR DIABETIC SHOE FITTING.PATIENT HAS CHOSEN MEN'S NEW BALANCE 813 BROWN LACE IN SIZE 10.  PATIENT RECEIVED 1 PAIR SHOES WITH 3 PAIRS CUSTOM DIABETIC INSERTS.  SHOES ARE TRIED ON FOR GOOD FIT PATIENT IS GIVEN WRITTEN BREAK IN AND WEAR INSTRUCTIONS.

## 2014-06-30 NOTE — Patient Instructions (Signed)

## 2014-07-01 ENCOUNTER — Ambulatory Visit: Payer: Medicare Other | Admitting: Physical Therapy

## 2014-07-01 DIAGNOSIS — M436 Torticollis: Secondary | ICD-10-CM

## 2014-07-01 DIAGNOSIS — R293 Abnormal posture: Secondary | ICD-10-CM

## 2014-07-01 DIAGNOSIS — R29898 Other symptoms and signs involving the musculoskeletal system: Secondary | ICD-10-CM

## 2014-07-01 DIAGNOSIS — M62838 Other muscle spasm: Secondary | ICD-10-CM

## 2014-07-01 DIAGNOSIS — R519 Headache, unspecified: Secondary | ICD-10-CM

## 2014-07-01 DIAGNOSIS — M542 Cervicalgia: Secondary | ICD-10-CM

## 2014-07-01 DIAGNOSIS — R51 Headache: Secondary | ICD-10-CM | POA: Diagnosis not present
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
## 2014-07-02 ENCOUNTER — Ambulatory Visit: Payer: Medicare Other | Admitting: Physical Therapy

## 2014-07-03 ENCOUNTER — Ambulatory Visit: Payer: Medicare Other | Admitting: Physical Therapy

## 2014-07-03 ENCOUNTER — Telehealth: Payer: Self-pay | Admitting: Endocrinology

## 2014-07-03 DIAGNOSIS — M542 Cervicalgia: Secondary | ICD-10-CM

## 2014-07-03 DIAGNOSIS — R51 Headache: Secondary | ICD-10-CM

## 2014-07-03 DIAGNOSIS — R519 Headache, unspecified: Secondary | ICD-10-CM

## 2014-07-03 DIAGNOSIS — M436 Torticollis: Secondary | ICD-10-CM

## 2014-07-03 DIAGNOSIS — R293 Abnormal posture: Secondary | ICD-10-CM
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
## 2014-07-03 NOTE — Patient Instructions (Signed)
Handout on headache reduction exercises:  Mobs with towel, upper cervical flexor stretch 5x each as needed

## 2014-07-03 NOTE — Telephone Encounter (Signed)
Patient need you to fax over prescription for Glucerna shakes to [facility] so she can pick it up, any questions Phone# [phone]  Fax# [phone]

## 2014-07-06 ENCOUNTER — Ambulatory Visit: Payer: Medicare Other | Admitting: Physical Therapy

## 2014-07-06 DIAGNOSIS — R51 Headache: Secondary | ICD-10-CM

## 2014-07-06 DIAGNOSIS — M436 Torticollis: Secondary | ICD-10-CM

## 2014-07-06 DIAGNOSIS — M542 Cervicalgia: Secondary | ICD-10-CM

## 2014-07-06 DIAGNOSIS — R293 Abnormal posture: Secondary | ICD-10-CM

## 2014-07-06 DIAGNOSIS — R519 Headache, unspecified: Secondary | ICD-10-CM
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
## 2014-07-07 ENCOUNTER — Ambulatory Visit: Payer: Medicare Other | Admitting: Physical Therapy

## 2014-07-07 DIAGNOSIS — M542 Cervicalgia: Secondary | ICD-10-CM

## 2014-07-07 DIAGNOSIS — R293 Abnormal posture: Secondary | ICD-10-CM

## 2014-07-07 DIAGNOSIS — M62838 Other muscle spasm: Secondary | ICD-10-CM

## 2014-07-07 DIAGNOSIS — R51 Headache: Secondary | ICD-10-CM | POA: Diagnosis not present

## 2014-07-07 DIAGNOSIS — R519 Headache, unspecified: Secondary | ICD-10-CM

## 2014-07-07 DIAGNOSIS — M436 Torticollis: Secondary | ICD-10-CM
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
## 2014-07-07 NOTE — Patient Instructions (Signed)
Discussed recommendation for a home over the door seated cervical traction unit with 5# resist (lighter than what was used today) static 5-8 min (decreased time).

## 2014-07-12 ENCOUNTER — Telehealth: Payer: Self-pay | Admitting: Endocrinology

## 2014-07-14 NOTE — Telephone Encounter (Signed)
rx faxed

## 2014-07-14 NOTE — Telephone Encounter (Signed)
Patient need a refill of test strips

## 2014-07-16 ENCOUNTER — Ambulatory Visit: Payer: Medicare Other | Admitting: Physical Therapy

## 2014-07-16 DIAGNOSIS — M542 Cervicalgia: Secondary | ICD-10-CM

## 2014-07-16 DIAGNOSIS — R293 Abnormal posture: Secondary | ICD-10-CM

## 2014-07-16 DIAGNOSIS — R51 Headache: Secondary | ICD-10-CM | POA: Diagnosis not present

## 2014-07-16 DIAGNOSIS — M436 Torticollis: Secondary | ICD-10-CM

## 2014-07-16 DIAGNOSIS — R29898 Other symptoms and signs involving the musculoskeletal system: Secondary | ICD-10-CM

## 2014-07-16 NOTE — Patient Instructions (Signed)
Over Head Pull: Narrow Grip       On back, knees bent, feet flat, band across thighs, elbows straight but relaxed. Pull hands apart (start). Keeping elbows straight, bring arms up and over head, hands toward floor. Keep pull steady on band. Hold momentarily. Return slowly, keeping pull steady, back to start. Repeat _7__ times. Band color ___yellow___   Side Pull: Double Arm   On back, knees bent, feet flat. Arms perpendicular to body, shoulder level, elbows straight but relaxed. Pull arms out to sides, elbows straight. Resistance band comes across collarbones, hands toward floor. Hold momentarily. Slowly return to starting position. Repeat _7__ times. Band color __yellow___   Sash   On back, knees bent, feet flat, left hand on left hip, right hand above left. Pull right arm DIAGONALLY (hip to shoulder) across chest. Bring right arm along head toward floor. Hold momentarily. Slowly return to starting position. Repeat _7__ times. Do with left arm. Band color __yellow____   Shoulder Rotation: Double Arm   On back, knees bent, feet flat, elbows tucked at sides, bent 90, hands palms up. Pull hands apart and down toward floor, keeping elbows near sides. Hold momentarily. Slowly return to starting position. Repeat _7__ times. Band color __yellow____
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
## 2014-07-17 ENCOUNTER — Ambulatory Visit: Payer: Medicare Other | Admitting: Physical Therapy

## 2014-07-17 DIAGNOSIS — R519 Headache, unspecified: Secondary | ICD-10-CM

## 2014-07-17 DIAGNOSIS — R293 Abnormal posture: Secondary | ICD-10-CM

## 2014-07-17 DIAGNOSIS — R29898 Other symptoms and signs involving the musculoskeletal system: Secondary | ICD-10-CM

## 2014-07-17 DIAGNOSIS — R51 Headache: Secondary | ICD-10-CM

## 2014-07-17 DIAGNOSIS — M436 Torticollis: Secondary | ICD-10-CM

## 2014-07-17 DIAGNOSIS — M542 Cervicalgia: Secondary | ICD-10-CM
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
## 2014-07-21 ENCOUNTER — Ambulatory Visit: Payer: Medicare Other | Attending: Orthopedic Surgery | Admitting: Physical Therapy

## 2014-07-21 DIAGNOSIS — R51 Headache: Secondary | ICD-10-CM | POA: Diagnosis present

## 2014-07-21 DIAGNOSIS — R29898 Other symptoms and signs involving the musculoskeletal system: Secondary | ICD-10-CM | POA: Diagnosis not present

## 2014-07-21 DIAGNOSIS — R519 Headache, unspecified: Secondary | ICD-10-CM

## 2014-07-21 DIAGNOSIS — M436 Torticollis: Secondary | ICD-10-CM | POA: Diagnosis not present

## 2014-07-21 DIAGNOSIS — R293 Abnormal posture: Secondary | ICD-10-CM | POA: Diagnosis not present

## 2014-07-21 DIAGNOSIS — M62838 Other muscle spasm: Secondary | ICD-10-CM | POA: Diagnosis not present

## 2014-07-21 DIAGNOSIS — M542 Cervicalgia: Secondary | ICD-10-CM
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
## 2014-07-23 ENCOUNTER — Encounter: Payer: Medicare Other | Attending: Endocrinology | Admitting: Dietician

## 2014-07-23 ENCOUNTER — Encounter: Payer: Self-pay | Admitting: Dietician

## 2014-07-23 VITALS — Wt 194.0 lb

## 2014-07-23 DIAGNOSIS — Z6833 Body mass index (BMI) 33.0-33.9, adult: Secondary | ICD-10-CM | POA: Insufficient documentation

## 2014-07-23 DIAGNOSIS — Z713 Dietary counseling and surveillance: Secondary | ICD-10-CM | POA: Diagnosis not present

## 2014-07-23 DIAGNOSIS — E119 Type 2 diabetes mellitus without complications: Secondary | ICD-10-CM | POA: Diagnosis not present

## 2014-07-23 DIAGNOSIS — E669 Obesity, unspecified: Secondary | ICD-10-CM | POA: Insufficient documentation

## 2014-07-23 NOTE — Progress Notes (Signed)
  Medical Nutrition Therapy: Appt Start time: 845 End time: 900  Follow up:  Jade Mathis returns having lost another 4 pounds. Most recent HgbA1c was 6.4% last month. Not logging foods as much. Having 1-2 Glucerna shakes per day as a snack. Does not weigh herself at home.    Wt Readings from Last 3 Encounters:  07/23/14 194 lb (87.998 kg)  06/26/14 198 lb (89.812 kg)  06/18/14 193 lb 8 oz (87.771 kg)   Ht Readings from Last 3 Encounters:  06/15/14 5\' 6"  (1.676 m)  05/20/14 5\' 6"  (1.676 m)  04/16/14 5\' 6"  (1.676 m)   There is no weight on file to calculate BMI. @BMIFA @ Normalized weight-for-age data available only for age 40 to 72 years. Normalized stature-for-age data available only for age 40 to 36 years.   TANITA  BODY COMP RESULTS  Fat Mass (lbs) 100.5 102 97.5 99 92 91.5 90  % Fat Mass 48.3 48.7 48 48.4 46.7 47.2 46.4   Fat Free Mass (lbs) 106.0 107 105.5 105.5 105 102 104    MEDICATIONS: See list   DIETARY INTAKE:     24-hr recall:  Breakfast: Glucerna shake or [place] yogurt with granola Snack: fruit and veggie smoothie Lunch: salad OR [place] and cheese sandwich on wheat, sometimes with chips Snack: Glucerna shake Dinner: baked chicken or fish, vegetables, rice or yeast roll  Beverages: unsweetened almond milk with protein powder, water  Usual physical activity: None  Estimated energy needs: 1600 calories 180 g carbohydrates  Progress Towards Goal(s):  Some progress.   Nutritional Diagnosis:  [place]-2.2 Altered nutrition-related laboratory As related to history of excessive energy intake and lack of exercise. As evidenced by elevated HgbA1c (6.2%) and recent weight gain.    Intervention:  Nutrition counseling provided.   Handouts provided: Snack List   Monitoring/Evaluation:  Dietary intake, exercise, blood glucose, and body weight in 1 month(s).

## 2014-07-23 NOTE — Patient Instructions (Signed)
-  Cut down to 1 Glucerna per day if you see weight loss slow

## 2014-07-24 ENCOUNTER — Encounter: Payer: Medicare HMO | Admitting: Physical Therapy

## 2014-07-30 ENCOUNTER — Other Ambulatory Visit: Payer: Self-pay

## 2014-08-07 ENCOUNTER — Other Ambulatory Visit: Payer: Self-pay | Admitting: Neurology

## 2014-08-07 ENCOUNTER — Other Ambulatory Visit: Payer: Self-pay | Admitting: *Deleted

## 2014-08-07 NOTE — Telephone Encounter (Signed)
Rx sent 

## 2014-08-13 ENCOUNTER — Telehealth: Payer: Self-pay | Admitting: *Deleted

## 2014-08-13 NOTE — Telephone Encounter (Signed)
Please advise 

## 2014-08-13 NOTE — Telephone Encounter (Signed)
Patient called she is not feeling well she has been experiencing major headache and also needs a sleep study done.  Call back number [phone]

## 2014-08-14 NOTE — Telephone Encounter (Signed)
She is seeing pain management for her pain/headaches, so please have her contact their office or she can follow-up in the clinic for evaluation.  Sleep study should be ordered by PCP.  Donika K. Posey Pronto, DO

## 2014-08-14 NOTE — Telephone Encounter (Signed)
Patient given information per Dr. Patel.  

## 2014-08-18 ENCOUNTER — Other Ambulatory Visit: Payer: Self-pay | Admitting: Obstetrics and Gynecology

## 2014-08-18 DIAGNOSIS — N644 Mastodynia: Secondary | ICD-10-CM

## 2014-08-18 DIAGNOSIS — N6452 Nipple discharge: Secondary | ICD-10-CM

## 2014-08-20 ENCOUNTER — Other Ambulatory Visit: Payer: Medicare HMO

## 2014-08-20 ENCOUNTER — Other Ambulatory Visit: Payer: Self-pay | Admitting: Endocrinology

## 2014-08-21 ENCOUNTER — Other Ambulatory Visit: Payer: Medicare HMO

## 2014-08-26 ENCOUNTER — Encounter: Payer: Medicare Other | Attending: Endocrinology | Admitting: Dietician

## 2014-08-26 ENCOUNTER — Ambulatory Visit (HOSPITAL_BASED_OUTPATIENT_CLINIC_OR_DEPARTMENT_OTHER): Payer: Medicare Other | Attending: Specialist | Admitting: *Deleted

## 2014-08-26 ENCOUNTER — Encounter: Payer: Self-pay | Admitting: Dietician

## 2014-08-26 VITALS — Ht 66.0 in | Wt 199.0 lb

## 2014-08-26 DIAGNOSIS — G4730 Sleep apnea, unspecified: Secondary | ICD-10-CM | POA: Insufficient documentation

## 2014-08-26 DIAGNOSIS — Z713 Dietary counseling and surveillance: Secondary | ICD-10-CM | POA: Insufficient documentation

## 2014-08-26 DIAGNOSIS — Z6833 Body mass index (BMI) 33.0-33.9, adult: Secondary | ICD-10-CM | POA: Insufficient documentation

## 2014-08-26 DIAGNOSIS — G4700 Insomnia, unspecified: Secondary | ICD-10-CM | POA: Diagnosis present

## 2014-08-26 DIAGNOSIS — R0683 Snoring: Secondary | ICD-10-CM | POA: Diagnosis not present

## 2014-08-26 DIAGNOSIS — E119 Type 2 diabetes mellitus without complications: Secondary | ICD-10-CM | POA: Insufficient documentation

## 2014-08-26 DIAGNOSIS — E669 Obesity, unspecified: Secondary | ICD-10-CM | POA: Diagnosis present

## 2014-08-26 NOTE — Patient Instructions (Signed)
-  Start working on cooking at home more  -Keep fresh or frozen vegetables available  -Keep quick-grab options on hand: [employer] protein bar, string cheese, boiled eggs and an apple, nuts  -Pair a carb and a protein  -Avoid skipping meals

## 2014-08-26 NOTE — Progress Notes (Signed)
  Medical Nutrition Therapy: Appt Start time: 160 End time: 850  Follow up:  Altha returns having gained about five pounds. She states she "binges" when she gets stressed. She eats sweets and other foods that she knows are not recommended. [place] has also been eating out more. However, she is not having as many Glucernas. Andreal tests her blood sugars 2x a week and states the numbers are "pretty good," about the same. She is not logging foods. She is interested in bariatric surgery but her BMI is too low.      Wt Readings from Last 3 Encounters:  08/26/14 199 lb (90.266 kg)  07/23/14 194 lb (87.998 kg)  06/26/14 198 lb (89.812 kg)   Ht Readings from Last 3 Encounters:  06/15/14 5\' 6"  (1.676 m)  05/20/14 5\' 6"  (1.676 m)  04/16/14 5\' 6"  (1.676 m)   There is no weight on file to calculate BMI. @BMIFA @ Normalized weight-for-age data available only for age 37 to 79 years. Normalized stature-for-age data available only for age 37 to 1 years.   TANITA  BODY COMP RESULTS  Fat Mass (lbs) 100.5 102 97.5 99 92 91.5 90 93  % Fat Mass 48.3 48.7 48 48.4 46.7 47.2 46.4 46.7   Fat Free Mass (lbs) 106.0 107 105.5 105.5 105 102 104 106    MEDICATIONS: See list   DIETARY INTAKE:     24-hr recall:  Breakfast: Glucerna shake or [place] yogurt with granola Snack: fruit and veggie smoothie Lunch: salad OR [place] and cheese sandwich on wheat, sometimes with chips Snack: Glucerna shake Dinner: baked chicken or fish, vegetables, rice or yeast roll  Beverages: unsweetened almond milk with protein powder, water  Usual physical activity: None  Estimated energy needs: 1600 calories 180 g carbohydrates  Progress Towards Goal(s):  Some progress.   Nutritional Diagnosis:  [place]-2.2 Altered nutrition-related laboratory As related to history of excessive energy intake and lack of exercise. As evidenced by elevated HgbA1c (6.2%) and recent weight gain.    Intervention:  Nutrition counseling  provided.   Monitoring/Evaluation:  Dietary intake, exercise, blood glucose, and body weight in 1 month(s).

## 2014-08-27 ENCOUNTER — Ambulatory Visit
Admission: RE | Admit: 2014-08-27 | Discharge: 2014-08-27 | Disposition: A | Discharge status: Home / Self Care | Payer: Medicare Other | Source: Ambulatory Visit | Attending: Obstetrics and Gynecology | Admitting: Obstetrics and Gynecology

## 2014-08-27 DIAGNOSIS — N644 Mastodynia: Secondary | ICD-10-CM

## 2014-08-27 DIAGNOSIS — N6452 Nipple discharge: Secondary | ICD-10-CM

## 2014-08-29 ENCOUNTER — Ambulatory Visit (HOSPITAL_BASED_OUTPATIENT_CLINIC_OR_DEPARTMENT_OTHER): Payer: Medicare Other | Admitting: Internal Medicine

## 2014-08-29 DIAGNOSIS — G4730 Sleep apnea, unspecified: Secondary | ICD-10-CM | POA: Diagnosis not present

## 2014-08-29 DIAGNOSIS — R0683 Snoring: Secondary | ICD-10-CM | POA: Diagnosis not present

## 2014-08-29 NOTE — Sleep Study (Signed)
   NAME: Jade Mathis DATE OF BIRTH:  [birth year] MEDICAL RECORD NUMBER [id number]  LOCATION: [facility]  PHYSICIAN: Finnian Husted D  DATE OF STUDY: 08/26/2014  SLEEP STUDY TYPE: Nocturnal Polysomnogram               REFERRING PHYSICIAN: Orie Rout, MD  INDICATION FOR STUDY: Insomnia with sleep apnea  EPWORTH SLEEPINESS SCORE:   17/24 HEIGHT: 5\' 6"  (167.6 cm)  WEIGHT: 199 lb (90.266 kg)    Body mass index is 32.13 kg/(m^2).  NECK SIZE: 14.25 in.  MEDICATIONS: Charted for review  SLEEP ARCHITECTURE: Total sleep time 387 minutes with sleep efficiency 92.4%. Stage I was 2.7%, stage II 78.2%, stage III 3.4%, REM 15.8% of total sleep time. Sleep latency 4 minutes, REM latency 92 minutes, awake after sleep onset 27.5 minutes, arousal index 7.9, bedtime medication: None  RESPIRATORY DATA: Apnea hypopnea index (AHI) 2.6 per hour. 17 total events scored, all as hypopneas, non-positional. REM AHI 14.8 per hour. CPAP titration was not done.  OXYGEN DATA: Occasional snoring was moderate with oxygen desaturation to a nadir of 85% and mean saturation 96.4% on room air.  CARDIAC DATA: Sinus rhythm with occasional PAC  MOVEMENT/PARASOMNIA: No significant movement disturbance, bathroom 1  IMPRESSION/ RECOMMENDATION:   1) Occasional respiratory event with sleep disturbance, within normal limits. AHI 2.6 per hour. The normal range for adults is an AHI from 0-5 events per hour. Occasional moderate snoring was noted with oxygen desaturation to a nadir of 85% and mean saturation 96.4% on room air. 2) Sleep architecture was unremarkable on this study with no bedtime medication reported.   Deneise Lever Diplomate, American Board of Sleep Medicine  ELECTRONICALLY SIGNED ON:  08/29/2014, 1:31 PM [place] PH: [phone]   FX: [phone] [place]

## 2014-09-03 ENCOUNTER — Other Ambulatory Visit: Payer: Self-pay | Admitting: Neurology

## 2014-09-04 ENCOUNTER — Other Ambulatory Visit: Payer: Self-pay | Admitting: *Deleted

## 2014-09-04 ENCOUNTER — Other Ambulatory Visit: Payer: Self-pay | Admitting: Pain Medicine

## 2014-09-04 DIAGNOSIS — M5412 Radiculopathy, cervical region: Secondary | ICD-10-CM

## 2014-09-04 MED ORDER — PYRIDOSTIGMINE BROMIDE 60 MG PO TABS: 60.0000 mg | ORAL_TABLET | Freq: Three times a day (TID) | ORAL | Status: DC

## 2014-09-04 NOTE — Telephone Encounter (Signed)
Rx sent 

## 2014-09-09 ENCOUNTER — Inpatient Hospital Stay (HOSPITAL_COMMUNITY)
Admission: EM | Admit: 2014-09-09 | Discharge: 2014-09-13 | DRG: 057 | Disposition: A | Discharge status: Home / Self Care | Payer: Medicare Other | Attending: Internal Medicine | Admitting: Internal Medicine

## 2014-09-09 ENCOUNTER — Encounter (HOSPITAL_COMMUNITY): Payer: Self-pay

## 2014-09-09 ENCOUNTER — Emergency Department (HOSPITAL_COMMUNITY): Payer: Medicare Other

## 2014-09-09 ENCOUNTER — Telehealth: Payer: Self-pay | Admitting: Neurology

## 2014-09-09 DIAGNOSIS — G894 Chronic pain syndrome: Secondary | ICD-10-CM | POA: Diagnosis present

## 2014-09-09 DIAGNOSIS — E282 Polycystic ovarian syndrome: Secondary | ICD-10-CM | POA: Diagnosis present

## 2014-09-09 DIAGNOSIS — Z833 Family history of diabetes mellitus: Secondary | ICD-10-CM

## 2014-09-09 DIAGNOSIS — Z818 Family history of other mental and behavioral disorders: Secondary | ICD-10-CM | POA: Diagnosis not present

## 2014-09-09 DIAGNOSIS — Z79899 Other long term (current) drug therapy: Secondary | ICD-10-CM | POA: Diagnosis not present

## 2014-09-09 DIAGNOSIS — G7000 Myasthenia gravis without (acute) exacerbation: Secondary | ICD-10-CM | POA: Diagnosis not present

## 2014-09-09 DIAGNOSIS — G500 Trigeminal neuralgia: Secondary | ICD-10-CM | POA: Diagnosis present

## 2014-09-09 DIAGNOSIS — K219 Gastro-esophageal reflux disease without esophagitis: Secondary | ICD-10-CM | POA: Diagnosis present

## 2014-09-09 DIAGNOSIS — Z7951 Long term (current) use of inhaled steroids: Secondary | ICD-10-CM

## 2014-09-09 DIAGNOSIS — E1142 Type 2 diabetes mellitus with diabetic polyneuropathy: Secondary | ICD-10-CM | POA: Diagnosis present

## 2014-09-09 DIAGNOSIS — E890 Postprocedural hypothyroidism: Secondary | ICD-10-CM | POA: Diagnosis present

## 2014-09-09 DIAGNOSIS — Z810 Family history of intellectual disabilities: Secondary | ICD-10-CM

## 2014-09-09 DIAGNOSIS — N979 Female infertility, unspecified: Secondary | ICD-10-CM | POA: Diagnosis present

## 2014-09-09 DIAGNOSIS — M797 Fibromyalgia: Secondary | ICD-10-CM | POA: Diagnosis present

## 2014-09-09 DIAGNOSIS — R1319 Other dysphagia: Secondary | ICD-10-CM | POA: Diagnosis present

## 2014-09-09 DIAGNOSIS — E039 Hypothyroidism, unspecified: Secondary | ICD-10-CM | POA: Diagnosis not present

## 2014-09-09 DIAGNOSIS — Z8249 Family history of ischemic heart disease and other diseases of the circulatory system: Secondary | ICD-10-CM

## 2014-09-09 DIAGNOSIS — J45909 Unspecified asthma, uncomplicated: Secondary | ICD-10-CM | POA: Diagnosis present

## 2014-09-09 DIAGNOSIS — E876 Hypokalemia: Secondary | ICD-10-CM | POA: Diagnosis present

## 2014-09-09 DIAGNOSIS — G7001 Myasthenia gravis with (acute) exacerbation: Secondary | ICD-10-CM | POA: Diagnosis present

## 2014-09-09 DIAGNOSIS — J4520 Mild intermittent asthma, uncomplicated: Secondary | ICD-10-CM | POA: Diagnosis not present

## 2014-09-09 DIAGNOSIS — F319 Bipolar disorder, unspecified: Secondary | ICD-10-CM | POA: Diagnosis present

## 2014-09-09 DIAGNOSIS — Z7952 Long term (current) use of systemic steroids: Secondary | ICD-10-CM

## 2014-09-09 DIAGNOSIS — G4730 Sleep apnea, unspecified: Secondary | ICD-10-CM | POA: Diagnosis present

## 2014-09-09 DIAGNOSIS — I10 Essential (primary) hypertension: Secondary | ICD-10-CM | POA: Diagnosis present

## 2014-09-09 DIAGNOSIS — B009 Herpesviral infection, unspecified: Secondary | ICD-10-CM | POA: Diagnosis present

## 2014-09-09 DIAGNOSIS — R531 Weakness: Secondary | ICD-10-CM | POA: Diagnosis present

## 2014-09-09 DIAGNOSIS — E785 Hyperlipidemia, unspecified: Secondary | ICD-10-CM | POA: Diagnosis present

## 2014-09-09 LAB — COMPREHENSIVE METABOLIC PANEL
ALT: 11 U/L — ABNORMAL LOW (ref 14–54)
AST: 14 U/L — ABNORMAL LOW (ref 15–41)
Albumin: 3.6 g/dL (ref 3.5–5.0)
Alkaline Phosphatase: 83 U/L (ref 38–126)
Anion gap: 6 (ref 5–15)
BUN: 12 mg/dL (ref 6–20)
CO2: 27 mmol/L (ref 22–32)
Calcium: 9.2 mg/dL (ref 8.9–10.3)
Chloride: 104 mmol/L (ref 101–111)
Creatinine, Ser: 0.71 mg/dL (ref 0.44–1.00)
GFR calc Af Amer: >60 mL/min (ref >60)
GFR calc non Af Amer: >60 mL/min (ref >60)
Glucose, Bld: 110 mg/dL — ABNORMAL HIGH (ref 65–99)
Potassium: 3.7 mmol/L (ref 3.5–5.1)
Sodium: 137 mmol/L (ref 135–145)
Total Bilirubin: 0.5 mg/dL (ref 0.3–1.2)
Total Protein: 7.5 g/dL (ref 6.5–8.1)

## 2014-09-09 LAB — CBC WITH DIFFERENTIAL/PLATELET
Basophils Absolute: 0 10*3/uL (ref 0.0–0.1)
Basophils Relative: 0 % (ref 0–1)
Eosinophils Absolute: 0.2 10*3/uL (ref 0.0–0.7)
Eosinophils Relative: 3 % (ref 0–5)
HCT: 39.2 % (ref 36.0–46.0)
Hemoglobin: 12.4 g/dL (ref 12.0–15.0)
Lymphocytes Relative: 35 % (ref 12–46)
Lymphs Abs: 2.8 10*3/uL (ref 0.7–4.0)
MCH: 25.8 pg — ABNORMAL LOW (ref 26.0–34.0)
MCHC: 31.6 g/dL (ref 30.0–36.0)
MCV: 81.5 fL (ref 78.0–100.0)
Monocytes Absolute: 0.5 10*3/uL (ref 0.1–1.0)
Monocytes Relative: 7 % (ref 3–12)
Neutro Abs: 4.4 10*3/uL (ref 1.7–7.7)
Neutrophils Relative %: 55 % (ref 43–77)
Platelets: 268 10*3/uL (ref 150–400)
RBC: 4.81 MIL/uL (ref 3.87–5.11)
RDW: 13 % (ref 11.5–15.5)
WBC: 8 10*3/uL (ref 4.0–10.5)

## 2014-09-09 LAB — TROPONIN I: Troponin I: <0.03 ng/mL (ref <0.031)

## 2014-09-09 LAB — CBG MONITORING, ED: Glucose-Capillary: 93 mg/dL (ref 65–99)

## 2014-09-09 MED ORDER — METOCLOPRAMIDE HCL 5 MG/ML IJ SOLN
10.0000 mg | Freq: Once | INTRAMUSCULAR | Status: AC
  Administered 2014-09-09: 10 mg via INTRAVENOUS
  Filled 2014-09-09: qty 2

## 2014-09-09 MED ORDER — DIPHENHYDRAMINE HCL 50 MG/ML IJ SOLN
25.0000 mg | Freq: Once | INTRAMUSCULAR | Status: AC
  Administered 2014-09-09: 25 mg via INTRAVENOUS
  Filled 2014-09-09: qty 1

## 2014-09-09 MED ORDER — SODIUM CHLORIDE 0.9 % IV BOLUS (SEPSIS)
1000.0000 mL | Freq: Once | INTRAVENOUS | Status: AC
  Administered 2014-09-09: 1000 mL via INTRAVENOUS
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
## 2014-09-09 NOTE — ED Notes (Signed)
Patient c/o migraine headache and vomiting x  4 days. Patient states she has been taking Ibuprofen, Oxycodone, and Topamax, and Imitrex for migraine and nothing has helped.

## 2014-09-09 NOTE — ED Notes (Signed)
Pt does not want to have her urine tested.

## 2014-09-09 NOTE — ED Notes (Signed)
Unable  To collect labs patient not in room I have been here three times to collect labs

## 2014-09-09 NOTE — Telephone Encounter (Signed)
Pt requesting return call, says she is feeling very weak. Please call back at [phone] / Sherri S.

## 2014-09-09 NOTE — ED Notes (Signed)
Patient c/o headache, described as migraine, states she has taken a variety of medications at home without relief. Patient states on Saturday she "just began to get real bad". Patient states she is also having difficulty swallowing and weakness. Patient with hx of myasthenia gravis.

## 2014-09-09 NOTE — ED Notes (Signed)
[facility] ETA 10 minutes 

## 2014-09-09 NOTE — ED Notes (Signed)
Dr. Doutova at bedside.  

## 2014-09-09 NOTE — ED Notes (Signed)
RT notified of NIF order.  

## 2014-09-09 NOTE — ED Notes (Signed)
Patient was advised we are in need of a urine sample, that the physician wanted to check a urinalysis. Patient states I don't have a urine infection, I don't need to have my urine tested.

## 2014-09-09 NOTE — Progress Notes (Signed)
Pt instructed on NIF.  Pt was very sleepy and gave fair effort.  Best of three attempts: -24.  RN aware.

## 2014-09-09 NOTE — ED Notes (Signed)
MD at bedside. 

## 2014-09-09 NOTE — Telephone Encounter (Signed)
Patient called stating that she feels very weak and has a headache.  Her voice sounded very weak and she was hard to understand.  I instructed her to go to the ER and call us back tomorrow with an update.  Patient agreed.

## 2014-09-10 ENCOUNTER — Encounter (HOSPITAL_COMMUNITY): Payer: Self-pay

## 2014-09-10 DIAGNOSIS — J4520 Mild intermittent asthma, uncomplicated: Secondary | ICD-10-CM

## 2014-09-10 DIAGNOSIS — K219 Gastro-esophageal reflux disease without esophagitis: Secondary | ICD-10-CM

## 2014-09-10 DIAGNOSIS — E876 Hypokalemia: Secondary | ICD-10-CM

## 2014-09-10 DIAGNOSIS — F329 Major depressive disorder, single episode, unspecified: Secondary | ICD-10-CM

## 2014-09-10 LAB — COMPREHENSIVE METABOLIC PANEL
ALT: 10 U/L — ABNORMAL LOW (ref 14–54)
AST: 15 U/L (ref 15–41)
Albumin: 2.9 g/dL — ABNORMAL LOW (ref 3.5–5.0)
Alkaline Phosphatase: 75 U/L (ref 38–126)
Anion gap: 5 (ref 5–15)
BUN: 9 mg/dL (ref 6–20)
CO2: 25 mmol/L (ref 22–32)
Calcium: 8.7 mg/dL — ABNORMAL LOW (ref 8.9–10.3)
Chloride: 107 mmol/L (ref 101–111)
Creatinine, Ser: 0.7 mg/dL (ref 0.44–1.00)
GFR calc Af Amer: >60 mL/min (ref >60)
GFR calc non Af Amer: >60 mL/min (ref >60)
Glucose, Bld: 178 mg/dL — ABNORMAL HIGH (ref 65–99)
Potassium: 3.4 mmol/L — ABNORMAL LOW (ref 3.5–5.1)
Sodium: 137 mmol/L (ref 135–145)
Total Bilirubin: 0.7 mg/dL (ref 0.3–1.2)
Total Protein: 6.1 g/dL — ABNORMAL LOW (ref 6.5–8.1)

## 2014-09-10 LAB — URINALYSIS, ROUTINE W REFLEX MICROSCOPIC
Bilirubin Urine: NEGATIVE
Glucose, UA: NEGATIVE mg/dL
Hgb urine dipstick: NEGATIVE
Ketones, ur: NEGATIVE mg/dL
Leukocytes, UA: NEGATIVE
Nitrite: NEGATIVE
Protein, ur: NEGATIVE mg/dL
Specific Gravity, Urine: 1.016 (ref 1.005–1.030)
Urobilinogen, UA: 1 mg/dL (ref 0.0–1.0)
pH: 6.5 (ref 5.0–8.0)

## 2014-09-10 LAB — CBC
HCT: 35.6 % — ABNORMAL LOW (ref 36.0–46.0)
Hemoglobin: 11.4 g/dL — ABNORMAL LOW (ref 12.0–15.0)
MCH: 26 pg (ref 26.0–34.0)
MCHC: 32 g/dL (ref 30.0–36.0)
MCV: 81.1 fL (ref 78.0–100.0)
Platelets: 262 10*3/uL (ref 150–400)
RBC: 4.39 MIL/uL (ref 3.87–5.11)
RDW: 13.2 % (ref 11.5–15.5)
WBC: 7.4 10*3/uL (ref 4.0–10.5)

## 2014-09-10 LAB — PHOSPHORUS: Phosphorus: 3.2 mg/dL (ref 2.5–4.6)

## 2014-09-10 LAB — GLUCOSE, CAPILLARY
Glucose-Capillary: 167 mg/dL — ABNORMAL HIGH (ref 65–99)
Glucose-Capillary: 106 mg/dL — ABNORMAL HIGH (ref 65–99)
Glucose-Capillary: 105 mg/dL — ABNORMAL HIGH (ref 65–99)
Glucose-Capillary: 143 mg/dL — ABNORMAL HIGH (ref 65–99)
Glucose-Capillary: 141 mg/dL — ABNORMAL HIGH (ref 65–99)
Glucose-Capillary: 178 mg/dL — ABNORMAL HIGH (ref 65–99)

## 2014-09-10 LAB — MRSA PCR SCREENING: MRSA by PCR: NEGATIVE

## 2014-09-10 LAB — MAGNESIUM: Magnesium: 1.8 mg/dL (ref 1.7–2.4)

## 2014-09-10 LAB — TSH: TSH: 0.916 u[IU]/mL (ref 0.350–4.500)

## 2014-09-10 MED ORDER — PREDNISONE 20 MG PO TABS
20.0000 mg | ORAL_TABLET | Freq: Every day | ORAL | Status: DC
  Administered 2014-09-10 (×2): 10 mg via ORAL
  Administered 2014-09-11 – 2014-09-13 (×2): 20 mg via ORAL
  Filled 2014-09-10 (×5): qty 1

## 2014-09-10 MED ORDER — BUDESONIDE-FORMOTEROL FUMARATE 160-4.5 MCG/ACT IN AERO: 2.0000 | INHALATION_SPRAY | Freq: Two times a day (BID) | RESPIRATORY_TRACT | Status: DC

## 2014-09-10 MED ORDER — ALBUTEROL SULFATE (2.5 MG/3ML) 0.083% IN NEBU: 2.5000 mg | INHALATION_SOLUTION | RESPIRATORY_TRACT | Status: DC | PRN

## 2014-09-10 MED ORDER — ACETAMINOPHEN 650 MG RE SUPP: 650.0000 mg | Freq: Four times a day (QID) | RECTAL | Status: DC | PRN

## 2014-09-10 MED ORDER — LEVOTHYROXINE SODIUM 175 MCG PO TABS
175.0000 ug | ORAL_TABLET | Freq: Every day | ORAL | Status: DC
  Administered 2014-09-10 – 2014-09-13 (×3): 175 ug via ORAL
  Filled 2014-09-10 (×5): qty 1

## 2014-09-10 MED ORDER — PYRIDOSTIGMINE BROMIDE 60 MG PO TABS
60.0000 mg | ORAL_TABLET | Freq: Three times a day (TID) | ORAL | Status: DC
  Administered 2014-09-10 – 2014-09-13 (×10): 60 mg via ORAL
  Filled 2014-09-10 (×12): qty 1

## 2014-09-10 MED ORDER — SODIUM CHLORIDE 0.9 % IV SOLN
INTRAVENOUS | Status: AC
  Administered 2014-09-10: 03:00:00 via INTRAVENOUS

## 2014-09-10 MED ORDER — ACETAMINOPHEN 325 MG PO TABS
650.0000 mg | ORAL_TABLET | Freq: Four times a day (QID) | ORAL | Status: DC | PRN
  Administered 2014-09-10: 650 mg via ORAL
  Filled 2014-09-10 (×2): qty 2

## 2014-09-10 MED ORDER — KETOROLAC TROMETHAMINE 30 MG/ML IJ SOLN
30.0000 mg | Freq: Two times a day (BID) | INTRAMUSCULAR | Status: AC | PRN
  Administered 2014-09-10: 30 mg via INTRAVENOUS
  Filled 2014-09-10 (×2): qty 1

## 2014-09-10 MED ORDER — METOCLOPRAMIDE HCL 5 MG/ML IJ SOLN
10.0000 mg | Freq: Once | INTRAMUSCULAR | Status: AC
  Administered 2014-09-10: 10 mg via INTRAVENOUS
  Filled 2014-09-10: qty 2

## 2014-09-10 MED ORDER — DIPHENHYDRAMINE HCL 50 MG/ML IJ SOLN
25.0000 mg | Freq: Once | INTRAMUSCULAR | Status: AC
  Administered 2014-09-10: 25 mg via INTRAVENOUS
  Filled 2014-09-10: qty 1

## 2014-09-10 MED ORDER — POTASSIUM CHLORIDE CRYS ER 20 MEQ PO TBCR
40.0000 meq | EXTENDED_RELEASE_TABLET | ORAL | Status: AC
  Administered 2014-09-10 (×2): 40 meq via ORAL
  Filled 2014-09-10 (×2): qty 2

## 2014-09-10 MED ORDER — ALBUTEROL SULFATE HFA 108 (90 BASE) MCG/ACT IN AERS: 2.0000 | INHALATION_SPRAY | Freq: Four times a day (QID) | RESPIRATORY_TRACT | Status: DC | PRN

## 2014-09-10 MED ORDER — FAMOTIDINE 20 MG PO TABS
20.0000 mg | ORAL_TABLET | Freq: Every day | ORAL | Status: DC
  Administered 2014-09-10 – 2014-09-13 (×4): 20 mg via ORAL
  Filled 2014-09-10 (×4): qty 1

## 2014-09-10 MED ORDER — AZATHIOPRINE 50 MG PO TABS
150.0000 mg | ORAL_TABLET | Freq: Every day | ORAL | Status: DC
  Administered 2014-09-10 – 2014-09-13 (×3): 150 mg via ORAL
  Filled 2014-09-10 (×4): qty 3

## 2014-09-10 MED ORDER — ONDANSETRON HCL 4 MG PO TABS
4.0000 mg | ORAL_TABLET | Freq: Four times a day (QID) | ORAL | Status: DC | PRN
  Administered 2014-09-12: 4 mg via ORAL
  Filled 2014-09-10: qty 1

## 2014-09-10 MED ORDER — FLUTICASONE PROPIONATE 50 MCG/ACT NA SUSP
1.0000 | Freq: Every day | NASAL | Status: DC | PRN
Start: 2014-09-10 — End: 2014-09-13

## 2014-09-10 MED ORDER — VALPROATE SODIUM 500 MG/5ML IV SOLN
500.0000 mg | Freq: Once | INTRAVENOUS | Status: AC
  Administered 2014-09-10: 500 mg via INTRAVENOUS
  Filled 2014-09-10: qty 5

## 2014-09-10 MED ORDER — PREDNISONE 10 MG PO TABS
10.0000 mg | ORAL_TABLET | Freq: Every day | ORAL | Status: DC
  Filled 2014-09-10: qty 1

## 2014-09-10 MED ORDER — MOMETASONE FURO-FORMOTEROL FUM 100-5 MCG/ACT IN AERO
2.0000 | INHALATION_SPRAY | Freq: Two times a day (BID) | RESPIRATORY_TRACT | Status: DC
  Administered 2014-09-10 – 2014-09-13 (×6): 2 via RESPIRATORY_TRACT
  Filled 2014-09-10: qty 8.8

## 2014-09-10 MED ORDER — KETOROLAC TROMETHAMINE 30 MG/ML IJ SOLN
30.0000 mg | Freq: Once | INTRAMUSCULAR | Status: AC
  Administered 2014-09-10: 30 mg via INTRAVENOUS
  Filled 2014-09-10: qty 1

## 2014-09-10 MED ORDER — ONDANSETRON HCL 4 MG/2ML IJ SOLN
4.0000 mg | Freq: Four times a day (QID) | INTRAMUSCULAR | Status: DC | PRN
  Administered 2014-09-10 – 2014-09-13 (×4): 4 mg via INTRAVENOUS
  Filled 2014-09-10 (×4): qty 2

## 2014-09-10 MED ORDER — HYDROMORPHONE HCL 1 MG/ML IJ SOLN
0.5000 mg | Freq: Once | INTRAMUSCULAR | Status: AC
  Administered 2014-09-10: 0.5 mg via INTRAVENOUS
  Filled 2014-09-10: qty 1

## 2014-09-10 MED ORDER — INSULIN ASPART 100 UNIT/ML ~~LOC~~ SOLN
0.0000 [IU] | SUBCUTANEOUS | Status: DC
  Administered 2014-09-10 (×2): 2 [IU] via SUBCUTANEOUS
  Administered 2014-09-10 (×2): 1 [IU] via SUBCUTANEOUS
  Administered 2014-09-11 (×3): 2 [IU] via SUBCUTANEOUS
  Administered 2014-09-11: 1 [IU] via SUBCUTANEOUS
  Administered 2014-09-11 – 2014-09-12 (×2): 2 [IU] via SUBCUTANEOUS
  Administered 2014-09-12: 1 [IU] via SUBCUTANEOUS
  Administered 2014-09-12: 2 [IU] via SUBCUTANEOUS
  Administered 2014-09-12: 1 [IU] via SUBCUTANEOUS

## 2014-09-10 MED ORDER — HYDROCODONE-ACETAMINOPHEN 5-325 MG PO TABS
1.0000 | ORAL_TABLET | ORAL | Status: DC | PRN
  Filled 2014-09-10: qty 2

## 2014-09-10 MED ORDER — SODIUM CHLORIDE 0.9 % IJ SOLN
3.0000 mL | Freq: Two times a day (BID) | INTRAMUSCULAR | Status: DC
  Administered 2014-09-10 – 2014-09-12 (×5): 3 mL via INTRAVENOUS

## 2014-09-10 MED ORDER — ARIPIPRAZOLE 10 MG PO TABS
10.0000 mg | ORAL_TABLET | Freq: Every day | ORAL | Status: DC
  Administered 2014-09-10 – 2014-09-13 (×3): 10 mg via ORAL
  Filled 2014-09-10 (×4): qty 1

## 2014-09-10 NOTE — Progress Notes (Signed)
Good effort. VC 1.6L and NIF - 30.

## 2014-09-10 NOTE — Progress Notes (Signed)
NIF/VC obtained post ambulation in room. Patient gave good effort and used proper technique, but admits she is a little "tired". NIF -28cmH20 VC 1.3L

## 2014-09-10 NOTE — Progress Notes (Addendum)
[facility] PROGRESS NOTE  Renia Mikelson SWH:[id number] DOB: 1975-04-16 DOA: 09/09/2014 PCP: Kristine Garbe, MD  Assessment/Plan: 1-myasthenia gravis exacerbation:  -close follow up to resp status -FVC/NIF every 12 hours -neurology on board, will follow rec's. Plan is for 3 days of IVIG -will continue mestinon, imuran and prednisone (last one increased to 20 to help decreasing chances of adrenal insufficiency in acute distress setting) . -PT -continue daily zovirax for prophylaxis  2-asthma: minimal wheezing on exam -continue PRN albuterol, Symbicort and Dulera  3-hypothyroidism: will continue synthroid  4-HTN: BP soft -will continue holding catapres -prednisone dose double -will follow VS and adjust medications as needed  5-diabetes type 2:  -SSI -continue amaryl  6-GERD: continue famotidine  7-depression: will continue abilify   8-chronic pain syndrome: continue PRN vicodin   9-hypokalemia: will replete as needed -follow electrolytes in am  Code Status: Full Family Communication: no family at bedside Disposition Plan: remains inpatient and in stepdown for now, close attention to FVC/NIF and resp status   Consultants:  Neurology (Dr. Nicole Kindred)   Procedures:  See below for x-ray reports   Antibiotics:  None   HPI/Subjective: Afebrile, no CP, no SOB. Patient reports slight heavy breathing sensation and increase SOB with minimal exertion  Objective: Filed Vitals:   09/10/14 0811  BP:   Pulse: 87  Temp: 98.4 F (36.9 C)  Resp: 13    Intake/Output Summary (Last 24 hours) at 09/10/14 0833 Last data filed at 09/10/14 0819  Gross per 24 hour  Intake 493.33 ml  Output    500 ml  Net  -6.67 ml   Filed Weights   09/09/14 1841  Weight: 88.451 kg (195 lb)    Exam:   General:  Afebrile, feeling very weak and tired with minimal exertion. Patient endorses heavy breathing. No CP, no nausea or vomiting   Cardiovascular: S1 and S2, no rubs or  gallops; no JVd  Respiratory: slight exp wheezing, no crackles, good air movement   Abdomen: soft, NT, no distended, positive BS  Musculoskeletal: no edema, no cyanosis  Data Reviewed: Basic Metabolic Panel:  Recent Labs Lab 09/09/14 2029 09/10/14 0208  NA 137 137  K 3.7 3.4*  CL 104 107  CO2 27 25  GLUCOSE 110* 178*  BUN 12 9  CREATININE 0.71 0.70  CALCIUM 9.2 8.7*  MG  --  1.8  PHOS  --  3.2   Liver Function Tests:  Recent Labs Lab 09/09/14 2029 09/10/14 0208  AST 14* 15  ALT 11* 10*  ALKPHOS 83 75  BILITOT 0.5 0.7  PROT 7.5 6.1*  ALBUMIN 3.6 2.9*   CBC:  Recent Labs Lab 09/09/14 2029 09/10/14 0208  WBC 8.0 7.4  NEUTROABS 4.4  --   HGB 12.4 11.4*  HCT 39.2 35.6*  MCV 81.5 81.1  PLT 268 262   Cardiac Enzymes:  Recent Labs Lab 09/09/14 2029  TROPONINI <0.03   BNP (last 3 results)  Recent Labs  04/17/14 0108  BNP 10.1   CBG:  Recent Labs Lab 09/09/14 1953 09/10/14 0156 09/10/14 0502  GLUCAP 93 167* 106*   Studies: Ct Head Wo Contrast  09/09/2014   CLINICAL DATA:  Migraine  EXAM: CT HEAD WITHOUT CONTRAST  TECHNIQUE: Contiguous axial images were obtained from the base of the skull through the vertex without intravenous contrast.  COMPARISON:  12/07/2013  FINDINGS: No skull fracture is noted. Paranasal sinuses and mastoid air cells are unremarkable.  No intracranial hemorrhage, mass effect or midline shift. No  acute cortical infarction. There are metallic clip artifacts high convexity forearm hair clips. No mass lesion is noted on this unenhanced scan.  IMPRESSION: No acute intracranial abnormality.  No significant change.   Electronically Signed   By: Lahoma Crocker M.D.   On: 09/09/2014 20:27   Dg Chest Port 1 View  09/09/2014   CLINICAL DATA:  Chest pain, lethargy  EXAM: PORTABLE CHEST - 1 VIEW  COMPARISON:  04/17/2014  FINDINGS: Study is limited by poor inspiration. No acute infiltrate or pulmonary edema. Again noted status post median  sternotomy.  IMPRESSION: No active disease.  Status post median sternotomy.   Electronically Signed   By: Lahoma Crocker M.D.   On: 09/09/2014 20:18    Scheduled Meds: . ARIPiprazole  10 mg Oral Daily  . azaTHIOprine  150 mg Oral Daily  . famotidine  20 mg Oral Daily  . insulin aspart  0-9 Units Subcutaneous 6 times per day  . levothyroxine  175 mcg Oral QAC breakfast  . mometasone-formoterol  2 puff Inhalation BID  . predniSONE  20 mg Oral Daily  . pyridostigmine  60 mg Oral TID  . sodium chloride  3 mL Intravenous Q12H   Continuous Infusions: . sodium chloride 100 mL/hr at 09/10/14 0234    Active Problems:   Myasthenia gravis   Asthma   Chronic pain syndrome   Weakness   Myasthenia gravis with exacerbation    Time spent: 40 minutes (> 50% of the time dedicated to face to face examination, coordination of care and discussion with patient about her condition, attempted treatment and plan of care)   Barton Dubois  [facility] Pager [phone]. If 7PM-7AM, please contact night-coverage at [email or web address], password [facility] 09/10/2014, 8:33 AM  LOS: 1 day

## 2014-09-10 NOTE — Telephone Encounter (Signed)
Noted  
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
## 2014-09-11 ENCOUNTER — Telehealth: Payer: Self-pay | Admitting: Neurology

## 2014-09-11 DIAGNOSIS — E039 Hypothyroidism, unspecified: Secondary | ICD-10-CM

## 2014-09-11 DIAGNOSIS — R531 Weakness: Secondary | ICD-10-CM

## 2014-09-11 LAB — BASIC METABOLIC PANEL
Anion gap: 9 (ref 5–15)
BUN: 10 mg/dL (ref 6–20)
CO2: 24 mmol/L (ref 22–32)
Calcium: 8.4 mg/dL — ABNORMAL LOW (ref 8.9–10.3)
Chloride: 104 mmol/L (ref 101–111)
Creatinine, Ser: 0.61 mg/dL (ref 0.44–1.00)
GFR calc Af Amer: >60 mL/min (ref >60)
GFR calc non Af Amer: >60 mL/min (ref >60)
Glucose, Bld: 173 mg/dL — ABNORMAL HIGH (ref 65–99)
Potassium: 3.6 mmol/L (ref 3.5–5.1)
Sodium: 137 mmol/L (ref 135–145)

## 2014-09-11 LAB — CBC
HCT: 35.1 % — ABNORMAL LOW (ref 36.0–46.0)
Hemoglobin: 11.3 g/dL — ABNORMAL LOW (ref 12.0–15.0)
MCH: 26.3 pg (ref 26.0–34.0)
MCHC: 32.2 g/dL (ref 30.0–36.0)
MCV: 81.6 fL (ref 78.0–100.0)
Platelets: 241 10*3/uL (ref 150–400)
RBC: 4.3 MIL/uL (ref 3.87–5.11)
RDW: 13.4 % (ref 11.5–15.5)
WBC: 6.4 10*3/uL (ref 4.0–10.5)

## 2014-09-11 LAB — GLUCOSE, CAPILLARY
Glucose-Capillary: 145 mg/dL — ABNORMAL HIGH (ref 65–99)
Glucose-Capillary: 186 mg/dL — ABNORMAL HIGH (ref 65–99)
Glucose-Capillary: 169 mg/dL — ABNORMAL HIGH (ref 65–99)
Glucose-Capillary: 178 mg/dL — ABNORMAL HIGH (ref 65–99)
Glucose-Capillary: 112 mg/dL — ABNORMAL HIGH (ref 65–99)
Glucose-Capillary: 153 mg/dL — ABNORMAL HIGH (ref 65–99)

## 2014-09-11 LAB — HEMOGLOBIN A1C
Hgb A1c MFr Bld: 6.4 % — ABNORMAL HIGH (ref 4.8–5.6)
Mean Plasma Glucose: 137 mg/dL

## 2014-09-11 MED ORDER — DIPHENHYDRAMINE HCL 25 MG PO CAPS
25.0000 mg | ORAL_CAPSULE | Freq: Four times a day (QID) | ORAL | Status: DC | PRN
Start: 2014-09-11 — End: 2014-09-13
  Administered 2014-09-11 – 2014-09-12 (×2): 25 mg via ORAL
  Filled 2014-09-11 (×2): qty 1

## 2014-09-11 MED ORDER — KETOROLAC TROMETHAMINE 30 MG/ML IJ SOLN
30.0000 mg | Freq: Once | INTRAMUSCULAR | Status: AC
  Administered 2014-09-11: 30 mg via INTRAVENOUS
  Filled 2014-09-11: qty 1

## 2014-09-11 MED ORDER — METOCLOPRAMIDE HCL 10 MG PO TABS
10.0000 mg | ORAL_TABLET | Freq: Once | ORAL | Status: AC
  Administered 2014-09-11: 10 mg via ORAL
  Filled 2014-09-11: qty 1

## 2014-09-11 MED ORDER — HYDROMORPHONE HCL 1 MG/ML IJ SOLN
1.0000 mg | INTRAMUSCULAR | Status: DC | PRN
  Administered 2014-09-11 – 2014-09-12 (×5): 1 mg via INTRAVENOUS
  Filled 2014-09-11 (×6): qty 1

## 2014-09-11 MED ORDER — DIPHENHYDRAMINE HCL 50 MG/ML IJ SOLN
25.0000 mg | Freq: Once | INTRAMUSCULAR | Status: AC
  Administered 2014-09-11: 25 mg via INTRAVENOUS
  Filled 2014-09-11: qty 1

## 2014-09-11 MED ORDER — IMMUNE GLOBULIN (HUMAN) 5 GM/50ML IV SOLN
400.0000 mg/kg | INTRAVENOUS | Status: DC
  Administered 2014-09-11 – 2014-09-12 (×3): 35 g via INTRAVENOUS
  Filled 2014-09-11 (×4): qty 50

## 2014-09-11 NOTE — Telephone Encounter (Signed)
Pt called, says Dr. Posey Pronto is admitting her in the hospital and she wants to know if this can be handled outpatient?????? Patient was very difficult to understand, I was not able to get more details. Please call back @ [phone]. / Venida Jarvis S>

## 2014-09-11 NOTE — Progress Notes (Signed)
Subjective: patient is feeling better today but still feels slight difficulty with swallowing  Objective: Current vital signs: BP 106/65 mmHg  Pulse 72  Temp(Src) 98.6 F (37 C) (Oral)  Resp 23  Ht 5\' 6"  (1.676 m)  Wt 88.451 kg (195 lb)  BMI 31.49 kg/m2  SpO2 99% Vital signs in last 24 hours: Temp:  [98.1 F (36.7 C)-98.6 F (37 C)] 98.6 F (37 C) (06/24 0810) Pulse Rate:  [67-96] 72 (06/24 0810) Resp:  [0-23] 23 (06/24 0810) BP: (94-118)/(47-70) 106/65 mmHg (06/24 0810) SpO2:  [97 %-100 %] 99 % (06/24 0905)  Intake/Output from previous day: 06/23 0701 - 06/24 0700 In: 816.4 [P.O.:450; I.V.:366.4] Out: 500 [Urine:500] Intake/Output this shift:   Nutritional status: DIET SOFT Room service appropriate?: Yes; Fluid consistency:: Thin  Neurologic Exam:  Mental Status: Alert, oriented, thought content appropriate.  Speech fluent without evidence of aphasia.  Able to follow 3 step commands without difficulty. Cranial Nerves: II: Visual fields grossly normal, pupils equal, round, reactive to light and accommodation III,IV, VI: ptosis not present, extra-ocular motions intact bilaterally, no lid lag today V,VII: smile symmetric, facial light touch sensation normal bilaterally VIII: hearing normal bilaterally IX,X: uvula rises symmetrically XI: bilateral shoulder shrug XII: midline tongue extension without atrophy or fasciculations  Motor: Right : Upper extremity   5/5    Left:     Upper extremity   5/5  Lower extremity   5/5     Lower extremity   5/5 --5/5 with forward flexion and extension of neck.  Tone and bulk:normal tone throughout; no atrophy noted Sensory: Pinprick and light touch intact throughout, bilaterally Deep Tendon Reflexes:  1+ bilateral UE and no LE Plantars: Mute bilaterally    Lab Results: Basic Metabolic Panel:  Recent Labs Lab 09/09/14 2029 09/10/14 0208 09/11/14 0237  NA 137 137 137  K 3.7 3.4* 3.6  CL 104 107 104  CO2 27 25 24    GLUCOSE 110* 178* 173*  BUN 12 9 10   CREATININE 0.71 0.70 0.61  CALCIUM 9.2 8.7* 8.4*  MG  --  1.8  --   PHOS  --  3.2  --     Liver Function Tests:  Recent Labs Lab 09/09/14 2029 09/10/14 0208  AST 14* 15  ALT 11* 10*  ALKPHOS 83 75  BILITOT 0.5 0.7  PROT 7.5 6.1*  ALBUMIN 3.6 2.9*   No results for input(s): LIPASE, AMYLASE in the last 168 hours. No results for input(s): AMMONIA in the last 168 hours.  CBC:  Recent Labs Lab 09/09/14 2029 09/10/14 0208 09/11/14 0237  WBC 8.0 7.4 6.4  NEUTROABS 4.4  --   --   HGB 12.4 11.4* 11.3*  HCT 39.2 35.6* 35.1*  MCV 81.5 81.1 81.6  PLT 268 262 241    Cardiac Enzymes:  Recent Labs Lab 09/09/14 2029  TROPONINI <0.03    Lipid Panel: No results for input(s): CHOL, TRIG, HDL, CHOLHDL, VLDL, LDLCALC in the last 168 hours.  CBG:  Recent Labs Lab 09/10/14 1628 09/10/14 1951 09/11/14 0059 09/11/14 0443 09/11/14 0809  GLUCAP 141* 178* 145* 186* 169*    Microbiology: Results for orders placed or performed during the hospital encounter of 09/09/14  MRSA PCR Screening     Status: None   Collection Time: 09/10/14  1:11 AM  Result Value Ref Range Status   MRSA by PCR NEGATIVE NEGATIVE Final    Comment:        The GeneXpert MRSA Assay (FDA approved  for NASAL specimens only), is one component of a comprehensive MRSA colonization surveillance program. It is not intended to diagnose MRSA infection nor to guide or monitor treatment for MRSA infections.     Coagulation Studies: No results for input(s): LABPROT, INR in the last 72 hours.  Imaging: Ct Head Wo Contrast  09/09/2014   CLINICAL DATA:  Migraine  EXAM: CT HEAD WITHOUT CONTRAST  TECHNIQUE: Contiguous axial images were obtained from the base of the skull through the vertex without intravenous contrast.  COMPARISON:  12/07/2013  FINDINGS: No skull fracture is noted. Paranasal sinuses and mastoid air cells are unremarkable.  No intracranial hemorrhage,  mass effect or midline shift. No acute cortical infarction. There are metallic clip artifacts high convexity forearm hair clips. No mass lesion is noted on this unenhanced scan.  IMPRESSION: No acute intracranial abnormality.  No significant change.   Electronically Signed   By: Lahoma Crocker M.D.   On: 09/09/2014 20:27   Dg Chest Port 1 View  09/09/2014   CLINICAL DATA:  Chest pain, lethargy  EXAM: PORTABLE CHEST - 1 VIEW  COMPARISON:  04/17/2014  FINDINGS: Study is limited by poor inspiration. No acute infiltrate or pulmonary edema. Again noted status post median sternotomy.  IMPRESSION: No active disease.  Status post median sternotomy.   Electronically Signed   By: Lahoma Crocker M.D.   On: 09/09/2014 20:18    Medications:  Scheduled: . ARIPiprazole  10 mg Oral Daily  . azaTHIOprine  150 mg Oral Daily  . famotidine  20 mg Oral Daily  . Immune Globulin 10%  400 mg/kg Intravenous Q24 Hr x 5  . insulin aspart  0-9 Units Subcutaneous 6 times per day  . levothyroxine  175 mcg Oral QAC breakfast  . mometasone-formoterol  2 puff Inhalation BID  . predniSONE  20 mg Oral Daily  . pyridostigmine  60 mg Oral TID  . sodium chloride  3 mL Intravenous Q12H    Assessment/Plan:  39 year old lady with myasthenia gravis presenting with probable exacerbation or possibly associated with upper GI symptoms of nausea and vomiting started 3-4 days ago. No further N/V.   Recommend  1) IVIG 0.4 g/KG for 2 more doses 2) No changes in current doses of Mestinon, prednisone and Imuran. 3) Every 12 hours FVC and NIF measurements   Lariyah Shetterly PA-C [facility] [phone]  09/11/2014, 11:17 AM   Patient seen and examined together with physician assistant and I concur with the assessment and plan.  Dorian Pod, MD

## 2014-09-11 NOTE — Progress Notes (Signed)
Patient removed Telemetry. Patient refused for staff to replace. Nurse discussed with patient the need for monitoring. Patient continued to refuse. Central Tele notified. Dr. Eliseo Squires made aware of situation.

## 2014-09-11 NOTE — Progress Notes (Addendum)
IVIG transfusion completed. No adverse reaction noted. 50cc of D5W was ran after IVIG was completed. Vital signs were taken Q48mins during transfusion. Post transfusion vital signs were taken. RN followed instructions given by IV team nurse on titration rates, vital sign protocols, and post procedure protocols.   Pt resting comfortably in bed.

## 2014-09-11 NOTE — Progress Notes (Signed)
Pt NIF -44, VC 710 with good effort.

## 2014-09-11 NOTE — Telephone Encounter (Signed)
Called patient back and got voice mail.  Left message that she probably needs to be in the hospital.  Informed her that Dr. Posey Pronto is out of the office at this time.

## 2014-09-11 NOTE — Progress Notes (Signed)
Attempted NIF x2. Patient with very poor effort and unable to get a reading due to poor mechanics. Pt refused to attempt again.

## 2014-09-11 NOTE — Progress Notes (Signed)
[facility] PROGRESS NOTE  Jade Mathis WER:[id number] DOB: 13-Nov-1975 DOA: 09/09/2014 PCP: Kristine Garbe, MD  Assessment/Plan: myasthenia gravis exacerbation:  -close follow up to resp status -FVC/NIF every 12 hours -neurology on board, will follow rec's. Plan is for 3 days of IVIG -will continue mestinon, imuran and prednisone (last one increased to 20 to help decreasing chances of adrenal insufficiency in acute distress setting) . -PT ordered 6/24 -continue daily zovirax for prophylaxis  asthma: minimal wheezing on exam -continue PRN albuterol, Symbicort and Dulera  hypothyroidism:  - synthroid  HTN: BP soft -will continue holding catapres -prednisone dose double -will follow VS and adjust medications as needed  diabetes type 2:  -SSI -continue amaryl  GERD: continue famotidine  depression: will continue abilify   chronic pain syndrome: continue PRN vicodin   hypokalemia: will replete as needed -follow electrolytes   Code Status: Full Family Communication: no family at bedside Disposition Plan: remains inpatient and in stepdown for now, close attention to FVC/NIF and resp status   Consultants:  Neurology (Dr. Nicole Kindred)   Procedures:  See below for x-ray reports   Antibiotics:  None   HPI/Subjective: Wants to go home and be treated outpatient Has been out of bed to bathroom  Objective: Filed Vitals:   09/11/14 0630  BP: 97/59  Pulse: 67  Temp: 98.4 F (36.9 C)  Resp: 16    Intake/Output Summary (Last 24 hours) at 09/11/14 0807 Last data filed at 09/11/14 0615  Gross per 24 hour  Intake 816.36 ml  Output    500 ml  Net 316.36 ml   Filed Weights   09/09/14 1841  Weight: 88.451 kg (195 lb)    Exam:   General:  NAD, sleepy   Cardiovascular: S1 and S2, no rubs or gallops; no JVd  Respiratory: good air movement   Abdomen: soft, NT, no distended, positive BS  Musculoskeletal: no edema, no cyanosis  Data Reviewed: Basic  Metabolic Panel:  Recent Labs Lab 09/09/14 2029 09/10/14 0208 09/11/14 0237  NA 137 137 137  K 3.7 3.4* 3.6  CL 104 107 104  CO2 27 25 24   GLUCOSE 110* 178* 173*  BUN 12 9 10   CREATININE 0.71 0.70 0.61  CALCIUM 9.2 8.7* 8.4*  MG  --  1.8  --   PHOS  --  3.2  --    Liver Function Tests:  Recent Labs Lab 09/09/14 2029 09/10/14 0208  AST 14* 15  ALT 11* 10*  ALKPHOS 83 75  BILITOT 0.5 0.7  PROT 7.5 6.1*  ALBUMIN 3.6 2.9*   CBC:  Recent Labs Lab 09/09/14 2029 09/10/14 0208 09/11/14 0237  WBC 8.0 7.4 6.4  NEUTROABS 4.4  --   --   HGB 12.4 11.4* 11.3*  HCT 39.2 35.6* 35.1*  MCV 81.5 81.1 81.6  PLT 268 262 241   Cardiac Enzymes:  Recent Labs Lab 09/09/14 2029  TROPONINI <0.03   BNP (last 3 results)  Recent Labs  04/17/14 0108  BNP 10.1   CBG:  Recent Labs Lab 09/10/14 1210 09/10/14 1628 09/10/14 1951 09/11/14 0059 09/11/14 0443  GLUCAP 143* 141* 178* 145* 186*   Studies: Ct Head Wo Contrast  09/09/2014   CLINICAL DATA:  Migraine  EXAM: CT HEAD WITHOUT CONTRAST  TECHNIQUE: Contiguous axial images were obtained from the base of the skull through the vertex without intravenous contrast.  COMPARISON:  12/07/2013  FINDINGS: No skull fracture is noted. Paranasal sinuses and mastoid air cells are unremarkable.  No  intracranial hemorrhage, mass effect or midline shift. No acute cortical infarction. There are metallic clip artifacts high convexity forearm hair clips. No mass lesion is noted on this unenhanced scan.  IMPRESSION: No acute intracranial abnormality.  No significant change.   Electronically Signed   By: Lahoma Crocker M.D.   On: 09/09/2014 20:27   Dg Chest Port 1 View  09/09/2014   CLINICAL DATA:  Chest pain, lethargy  EXAM: PORTABLE CHEST - 1 VIEW  COMPARISON:  04/17/2014  FINDINGS: Study is limited by poor inspiration. No acute infiltrate or pulmonary edema. Again noted status post median sternotomy.  IMPRESSION: No active disease.  Status post  median sternotomy.   Electronically Signed   By: Lahoma Crocker M.D.   On: 09/09/2014 20:18    Scheduled Meds: . ARIPiprazole  10 mg Oral Daily  . azaTHIOprine  150 mg Oral Daily  . famotidine  20 mg Oral Daily  . Immune Globulin 10%  400 mg/kg Intravenous Q24 Hr x 5  . insulin aspart  0-9 Units Subcutaneous 6 times per day  . levothyroxine  175 mcg Oral QAC breakfast  . mometasone-formoterol  2 puff Inhalation BID  . predniSONE  20 mg Oral Daily  . pyridostigmine  60 mg Oral TID  . sodium chloride  3 mL Intravenous Q12H   Continuous Infusions:    Active Problems:   Myasthenia gravis   Asthma   Chronic pain syndrome   Weakness   Myasthenia gravis with exacerbation    Time spent: 35 minutes (> 50% of the time dedicated to face to face examination, coordination of care and discussion with patient about her condition, attempted treatment and plan of care)   Milas Schappell, [facility] Pager [phone]. If 7PM-7AM, please contact night-coverage at [email or web address], password [facility] 09/11/2014, 8:07 AM  LOS: 2 days

## 2014-09-12 LAB — GLUCOSE, CAPILLARY
Glucose-Capillary: 129 mg/dL — ABNORMAL HIGH (ref 65–99)
Glucose-Capillary: 133 mg/dL — ABNORMAL HIGH (ref 65–99)
Glucose-Capillary: 143 mg/dL — ABNORMAL HIGH (ref 65–99)
Glucose-Capillary: 144 mg/dL — ABNORMAL HIGH (ref 65–99)
Glucose-Capillary: 151 mg/dL — ABNORMAL HIGH (ref 65–99)
Glucose-Capillary: 160 mg/dL — ABNORMAL HIGH (ref 65–99)
Glucose-Capillary: 161 mg/dL — ABNORMAL HIGH (ref 65–99)

## 2014-09-12 MED ORDER — GLIMEPIRIDE 1 MG PO TABS
1.0000 mg | ORAL_TABLET | Freq: Every day | ORAL | Status: DC
  Administered 2014-09-13: 1 mg via ORAL
  Filled 2014-09-12 (×2): qty 1

## 2014-09-12 MED ORDER — KETOROLAC TROMETHAMINE 15 MG/ML IJ SOLN
15.0000 mg | Freq: Once | INTRAMUSCULAR | Status: DC
  Filled 2014-09-12: qty 1

## 2014-09-12 MED ORDER — TOPIRAMATE 100 MG PO TABS
100.0000 mg | ORAL_TABLET | Freq: Every day | ORAL | Status: DC
  Filled 2014-09-12: qty 1

## 2014-09-12 MED ORDER — OXYCODONE-ACETAMINOPHEN 5-325 MG PO TABS
1.0000 | ORAL_TABLET | ORAL | Status: DC | PRN
  Administered 2014-09-12 – 2014-09-13 (×4): 1 via ORAL
  Filled 2014-09-12 (×4): qty 1

## 2014-09-12 MED ORDER — PHENOL 1.4 % MT LIQD
1.0000 | OROMUCOSAL | Status: DC | PRN
  Filled 2014-09-12: qty 177

## 2014-09-12 MED ORDER — INSULIN ASPART 100 UNIT/ML ~~LOC~~ SOLN: 0.0000 [IU] | Freq: Every day | SUBCUTANEOUS | Status: DC

## 2014-09-12 MED ORDER — GABAPENTIN 400 MG PO CAPS
400.0000 mg | ORAL_CAPSULE | Freq: Three times a day (TID) | ORAL | Status: DC
  Administered 2014-09-12 – 2014-09-13 (×3): 400 mg via ORAL
  Filled 2014-09-12 (×6): qty 1

## 2014-09-12 MED ORDER — DULOXETINE HCL 30 MG PO CPEP
30.0000 mg | ORAL_CAPSULE | Freq: Two times a day (BID) | ORAL | Status: DC
  Administered 2014-09-12 – 2014-09-13 (×3): 30 mg via ORAL
  Filled 2014-09-12 (×4): qty 1

## 2014-09-12 MED ORDER — OXYCODONE-ACETAMINOPHEN 10-325 MG PO TABS: 1.0000 | ORAL_TABLET | ORAL | Status: DC | PRN

## 2014-09-12 MED ORDER — TOPIRAMATE 100 MG PO TABS
100.0000 mg | ORAL_TABLET | Freq: Every day | ORAL | Status: DC
  Administered 2014-09-12 – 2014-09-13 (×2): 100 mg via ORAL
  Filled 2014-09-12 (×2): qty 1

## 2014-09-12 MED ORDER — OXYCODONE HCL 5 MG PO TABS
5.0000 mg | ORAL_TABLET | ORAL | Status: DC | PRN
  Administered 2014-09-12 – 2014-09-13 (×4): 5 mg via ORAL
  Filled 2014-09-12 (×4): qty 1

## 2014-09-12 MED ORDER — INSULIN ASPART 100 UNIT/ML ~~LOC~~ SOLN
0.0000 [IU] | Freq: Three times a day (TID) | SUBCUTANEOUS | Status: DC
  Administered 2014-09-13: 1 [IU] via SUBCUTANEOUS

## 2014-09-12 NOTE — Progress Notes (Signed)
Patient requested RN to patient's room when her lunch tray arrived. RN went into patient's room to bring patient her lunch tray and her meds. Patient explained to patient the meds that she would be receiving. Patient then stated she only wanted her "migraine cocktail" and told RN to leave the room. RN asked patient if she wanted her meds and patient refused, stating "I don't want to talk to you and I don't want you to come back in here anymore. Go get the charge nurse." RN notified charge nurse.

## 2014-09-12 NOTE — Progress Notes (Signed)
Cont to c/o pain; Dr. Eliseo Squires notified and order given for Toradol but patient refused it when offered.

## 2014-09-12 NOTE — Progress Notes (Addendum)
RN was called into patient's room again and patient was complaining of pain. Patient wanted dilaudid but Dr. Eliseo Squires had spoken to patient and discontinued dilaudid as patient is able to take PO meds. RN offered patient PRN oxycodone and patient took it. As RN attempted to give patient her other morning meds, patient stated that she could no longer take any more meds. Patient stated that her throat hurts and could not swallow any more but patient still continued to eat her breakfast. RN educated patient on the importance of taking her meds but patient refused. Patient wanted to request a new hospitalist, stating that "nothing was being done for her and she could have just stayed home." Family at bedside. Dr. Eliseo Squires notified and aware.

## 2014-09-12 NOTE — Progress Notes (Signed)
Spoke with pt a 2nd time on her request. Still asking for Dilaudid IV. Explained to pt. That I had spoken to Dr Eliseo Squires & that she will not reorder the Dilaudid. Pt asked that the Nurse page DR Eliseo Squires to possibly get ordered a "migraine cocktail" like she had before. Spoke to RN about paging DR again for this request. Pt also wanted [place] to see if she couldn't get her last IVIG treatment early talking about checking herself out & going to another hospital. Told pt.I wish you would stay for your treatment but that is her choice.

## 2014-09-12 NOTE — Progress Notes (Signed)
Pt asked to speak with the charge RN. Entered pt room & introduced myself listened to pt's concern. She was concerned that her pain was not being controlled properly. Wants IV Dilaudid & DR Eliseo Squires switched the pt baqck to her regular pain meds that she receives at home that are PO. PT says that's not good enough & that she can do that at home. Wants another DR. Dr Eliseo Squires was called earlier & stated that she had been seen today & if she still felt that way a new MD could see her tomorrow. Pt wants a new DR today so she can get her Dilaudid. Told her there is no guarantee that the new DR will order the Dilaudid. Inquired about pt's pain level & location. Pt stated her pain is all over and is a "50" Reminded pt that we scales on a 1 to 10 she said ' 10 x 50" Pt did take po pain med refused most of her other regular meds. Offered pt hot or cold packs, ice chips or to call the med for some throat spray. Pt said I don't want anything right now you can leave now. Left pt with the phone number for office of Patient experience reminded her they are closed on the weekend.

## 2014-09-12 NOTE — Evaluation (Signed)
Physical Therapy Evaluation Patient Details Name: Jade Mathis MRN: [id number] DOB: [birth year] Today's Date: 09/12/2014   History of Present Illness  Pt admit with myasthenia gravis exacerbation.  IVIG.    Clinical Impression  Pt admitted with above diagnosis. Pt currently with functional limitations due to the deficits listed below (see PT Problem List).Bed level eval as pt refused to get up this am.  Planned to return at 2pm however nursing notified PT that pt may leave AMA.  Will check back Monday if pt still here.  If she leaves today and will agree HHPT and RW recommended.   Pt will benefit from skilled PT to increase their independence and safety with mobility to allow discharge to the venue listed below.      Follow Up Recommendations Home health PT;Supervision/Assistance - 24 hour    Equipment Recommendations  Rolling walker with 5" wheels    Recommendations for Other Services       Precautions / Restrictions Precautions Precautions: Fall Restrictions Weight Bearing Restrictions: No      Mobility  Bed Mobility Overal bed mobility: Independent             General bed mobility comments: Can roll and get to EOB on her own.   Transfers                 General transfer comment: Has been walking to bathroom per pt with help from sons using quad cane.Off balance per pt and son with quad cane.   Ambulation/Gait                Stairs            Wheelchair Mobility    Modified Rankin (Stroke Patients Only)       Balance                                             Pertinent Vitals/Pain Pain Assessment: 0-10 Pain Score: 8  Pain Location: head Pain Descriptors / Indicators: Aching Pain Intervention(s): Patient requesting pain meds-RN notified;Monitored during session;Limited activity within patient's tolerance  VSS    Home Living Family/patient expects to be discharged to:: Private residence Living Arrangements:  Spouse/significant other Available Help at Discharge: Family;Available 24 hours/day Type of Home: House Home Access: Stairs to enter Entrance Stairs-Rails: None Entrance Stairs-Number of Steps: 1 Home Layout: One level Home Equipment: Cane - single point;Cane - quad;Tub bench      Prior Function Level of Independence: Independent with assistive device(s)         Comments: had been using quad cane recently.      Hand Dominance        Extremity/Trunk Assessment   Upper Extremity Assessment: Defer to OT evaluation           Lower Extremity Assessment: Generalized weakness         Communication   Communication: No difficulties  Cognition Arousal/Alertness: Awake/alert Behavior During Therapy: Flat affect Overall Cognitive Status: Within Functional Limits for tasks assessed                      General Comments      Exercises        Assessment/Plan    PT Assessment Patient needs continued PT services  PT Diagnosis Generalized weakness;Acute pain   PT Problem List Decreased activity tolerance;Decreased balance;Decreased mobility;Decreased knowledge  of use of DME;Decreased safety awareness;Decreased knowledge of precautions;Pain  PT Treatment Interventions DME instruction;Gait training;Functional mobility training;Therapeutic activities;Therapeutic exercise;Balance training;Stair training;Patient/family education   PT Goals (Current goals can be found in the Care Plan section) Acute Rehab PT Goals Patient Stated Goal: to get better PT Goal Formulation: With patient Time For Goal Achievement: 09/19/14 Potential to Achieve Goals: Good    Frequency Min 3X/week   Barriers to discharge        Co-evaluation               End of Session   Activity Tolerance: Patient limited by fatigue (limited to bed level eval due to headache) Patient left: in bed;with call bell/phone within reach;with family/visitor present Nurse Communication: Patient  requests pain meds         Time: 8032-1224 PT Time Calculation (min) (ACUTE ONLY): 10 min   Charges:   PT Evaluation $Initial PT Evaluation Tier I: 1 Procedure     PT G CodesDenice Mathis 30-Sep-2014, 1:34 PM [facility] [phone] (pager)

## 2014-09-12 NOTE — Progress Notes (Addendum)
RN went in patient's room at 0800 to complete assessment. Patient was complaining of pain and wanted pain medicine. RN got dilaudid for patient but then patient stated she wanted to wait and did not want RN to come back into patient's room until she called.

## 2014-09-12 NOTE — Progress Notes (Signed)
RN entered patient's room because patient was complaining of pain again. RN informed patient that her oxycodone is not due until 2pm. Patient stated that her pain level is a "50" and wanted dilaudid. RN reminded patient that it was discontinued by Dr. Eliseo Squires. Patient then became verbally abusive to RN and NT and was threatening to leave AMA and demanded to speak to charge nurse. Dr. Eliseo Squires notified of this and stated that if patient wanted to leave AMA, then it is her choice. Will notify Dr. Eliseo Squires if patient does leave AMA. Will continue to monitor patient.

## 2014-09-12 NOTE — Progress Notes (Addendum)
[facility] PROGRESS NOTE  Jade Mathis IOX:[id number] DOB: Jun 14, 1975 DOA: 09/09/2014 PCP: Kristine Garbe, MD   Jade Mathis is an 39 y.o. female with a history of myasthenia gravis status post thymectomy, trigeminal neuralgia, hypertension, hyperlipidemia, fibromyalgia, Graves' disease, bipolar disorder and headaches, presenting with increasing fatigue, worsening of dysphagia beyond baseline and increased weakness of lower extremities with 3-4 days. She's had some nausea and vomiting as well. She's had no documented fever. A CT scan of her head was obtained which showed no acute abnormality. She's been taking Mestinon 60 mg 3 times a day, Imuran 50 mg 3 times per day and prednisone 10 mg per day. NIF was -24. Patient is being treated with IVIG for possible exacerbation of myasthenia gravis.  Assessment/Plan: myasthenia gravis exacerbation:  -close follow up of resp status -FVC/NIF every 12 hours -neurology on board, will follow rec's. Plan is for 3 days of IVIG- today is day 3-- patient would like it re-timed-- will defer to neuro -will continue mestinon, imuran and prednisone (last one increased to 20 to help decreasing chances of adrenal insufficiency in acute distress setting) . -PT ordered 6/24  asthma: minimal wheezing on exam -continue PRN albuterol, Symbicort and Dulera  hypothyroidism:  - synthroid  HTN: BP soft -will continue holding catapres -prednisone dose double -will follow VS and adjust medications as needed  diabetes type 2:  -SSI -continue amaryl  GERD: continue famotidine  depression: will continue abilify   chronic pain syndrome: continue PRN vicodin  D/c IV dilaudid  hypokalemia: will replete as needed -follow electrolytes   Throat pain -chloraseptic spray  D/c dilaudid as BP low and patient able to take POs- as soon as I did, patient asked for new hospitalist -refusing some meds and demanding pain medications -patient does not appear to be in  pain- visiting with family, watching TV  Code Status: Full Family Communication:  family at bedside Disposition Plan:    Consultants:  Neurology   Procedures:    HPI/Subjective: C/o sore throat  Objective: Filed Vitals:   09/12/14 0803  BP: 112/52  Pulse: 72  Temp: 98.5 F (36.9 C)  Resp: 24    Intake/Output Summary (Last 24 hours) at 09/12/14 0835 Last data filed at 09/12/14 0450  Gross per 24 hour  Intake  244.5 ml  Output      0 ml  Net  244.5 ml   Filed Weights   09/09/14 1841  Weight: 88.451 kg (195 lb)    Exam:   General:  NAD, cooperative- visiting with family  Cardiovascular: S1 and S2, no rubs or gallops; no JVd  Respiratory: good air movement   Abdomen: soft, NT, no distended, positive BS  Musculoskeletal: no edema, no cyanosis  Data Reviewed: Basic Metabolic Panel:  Recent Labs Lab 09/09/14 2029 09/10/14 0208 09/11/14 0237  NA 137 137 137  K 3.7 3.4* 3.6  CL 104 107 104  CO2 27 25 24   GLUCOSE 110* 178* 173*  BUN 12 9 10   CREATININE 0.71 0.70 0.61  CALCIUM 9.2 8.7* 8.4*  MG  --  1.8  --   PHOS  --  3.2  --    Liver Function Tests:  Recent Labs Lab 09/09/14 2029 09/10/14 0208  AST 14* 15  ALT 11* 10*  ALKPHOS 83 75  BILITOT 0.5 0.7  PROT 7.5 6.1*  ALBUMIN 3.6 2.9*   CBC:  Recent Labs Lab 09/09/14 2029 09/10/14 0208 09/11/14 0237  WBC 8.0 7.4 6.4  NEUTROABS 4.4  --   --  HGB 12.4 11.4* 11.3*  HCT 39.2 35.6* 35.1*  MCV 81.5 81.1 81.6  PLT 268 262 241   Cardiac Enzymes:  Recent Labs Lab 09/09/14 2029  TROPONINI <0.03   BNP (last 3 results)  Recent Labs  04/17/14 0108  BNP 10.1   CBG:  Recent Labs Lab 09/11/14 1224 09/11/14 1638 09/11/14 2102 09/12/14 0009 09/12/14 0332  GLUCAP 178* 112* 153* 161* 151*   Studies: No results found.  Scheduled Meds: . ARIPiprazole  10 mg Oral Daily  . azaTHIOprine  150 mg Oral Daily  . famotidine  20 mg Oral Daily  . Immune Globulin 10%  400 mg/kg  Intravenous Q24 Hr x 5  . insulin aspart  0-9 Units Subcutaneous 6 times per day  . levothyroxine  175 mcg Oral QAC breakfast  . mometasone-formoterol  2 puff Inhalation BID  . predniSONE  20 mg Oral Daily  . pyridostigmine  60 mg Oral TID  . sodium chloride  3 mL Intravenous Q12H   Continuous Infusions:    Active Problems:   HYPOTHYROIDISM, POSTSURGICAL   Myasthenia gravis   Asthma   Chronic pain syndrome   Weakness   Myasthenia gravis with exacerbation    Time spent: 25 min   Mathis, Jade  [facility] Pager [phone]. If 7PM-7AM, please contact night-coverage at [email or web address], password [facility] 09/12/2014, 8:35 AM  LOS: 3 days

## 2014-09-13 DIAGNOSIS — G894 Chronic pain syndrome: Secondary | ICD-10-CM

## 2014-09-13 DIAGNOSIS — G7000 Myasthenia gravis without (acute) exacerbation: Secondary | ICD-10-CM

## 2014-09-13 DIAGNOSIS — G7001 Myasthenia gravis with (acute) exacerbation: Principal | ICD-10-CM

## 2014-09-13 DIAGNOSIS — I10 Essential (primary) hypertension: Secondary | ICD-10-CM

## 2014-09-13 LAB — GLUCOSE, CAPILLARY: Glucose-Capillary: 143 mg/dL — ABNORMAL HIGH (ref 65–99)

## 2014-09-13 NOTE — Progress Notes (Signed)
Discharge Note:  Pt. Informed of discharge instructions from physician. IV removed. Husband available to drive her home.
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
## 2014-09-13 NOTE — Progress Notes (Signed)
NIF -40, VC 2L with great effort.
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
## 2014-09-14 ENCOUNTER — Telehealth: Payer: Self-pay | Admitting: *Deleted

## 2014-09-14 NOTE — Telephone Encounter (Signed)
Patient called today she has ben in the hospital for a few days she scheduled a follow up 06/30 but would a call earlier  Call [phone]

## 2014-09-15 NOTE — Telephone Encounter (Signed)
Patient wants outpatient IVIG.  She is coming in on Thursday.  She is having SOB but is trying to avoid going back to hospital since she has small kids at home.

## 2014-09-15 NOTE — Telephone Encounter (Signed)
If she is having shortness of breath, going to the emergency department is appropriate.  Otherwise, she can increase her mestinon to 60mg  four times daily and prednisone to 15mg  daily (1.5 tablets).

## 2014-09-15 NOTE — Telephone Encounter (Signed)
Called patient and she is still feeling very weak and voice sounds weak.  She is wondering if she should go back to the hospital.  Please advise.

## 2014-09-16 ENCOUNTER — Telehealth: Payer: Self-pay | Admitting: Neurology

## 2014-09-16 NOTE — Telephone Encounter (Signed)
I called patient and she said that she is very weak and having some SOB.  I instructed her to go to the ER but she does not want to go back to the hospital.  Informed her that I am working on trying to get her home infusions but I am unable to meet with the [facility] rep until tomorrow.  Patient ok with waiting for home infusions.

## 2014-09-16 NOTE — Telephone Encounter (Signed)
Pt states that she would like to talk to someone because she is really weak and needs to know what to do please call [phone]

## 2014-09-16 NOTE — Telephone Encounter (Signed)
I have called [facility] about home therapy.  Will try to set this up.

## 2014-09-17 ENCOUNTER — Ambulatory Visit: Payer: Medicare Other | Admitting: Neurology

## 2014-09-18 ENCOUNTER — Other Ambulatory Visit: Payer: Medicare Other

## 2014-09-18 ENCOUNTER — Other Ambulatory Visit: Payer: Self-pay | Admitting: Endocrinology

## 2014-09-19 ENCOUNTER — Ambulatory Visit
Admission: RE | Admit: 2014-09-19 | Discharge: 2014-09-19 | Disposition: A | Discharge status: Home / Self Care | Payer: Medicare Other | Source: Ambulatory Visit | Attending: Pain Medicine | Admitting: Pain Medicine

## 2014-09-19 DIAGNOSIS — M5412 Radiculopathy, cervical region: Secondary | ICD-10-CM

## 2014-09-22 ENCOUNTER — Other Ambulatory Visit (INDEPENDENT_AMBULATORY_CARE_PROVIDER_SITE_OTHER): Payer: Medicare Other

## 2014-09-22 ENCOUNTER — Other Ambulatory Visit: Payer: Medicare HMO

## 2014-09-22 ENCOUNTER — Telehealth: Payer: Self-pay | Admitting: *Deleted

## 2014-09-22 DIAGNOSIS — E119 Type 2 diabetes mellitus without complications: Secondary | ICD-10-CM

## 2014-09-22 LAB — POCT URINALYSIS DIPSTICK
Bilirubin, UA: NEGATIVE
Blood, UA: NEGATIVE
Glucose, UA: NEGATIVE
Ketones, UA: NEGATIVE
Leukocytes, UA: NEGATIVE
Nitrite, UA: NEGATIVE
Protein, UA: NEGATIVE
Spec Grav, UA: 1.01
Urobilinogen, UA: 0.2
pH, UA: 6.5

## 2014-09-22 NOTE — Telephone Encounter (Signed)
[place] [phone]

## 2014-09-23 ENCOUNTER — Other Ambulatory Visit: Payer: Medicare Other

## 2014-09-23 LAB — MICROALBUMIN / CREATININE URINE RATIO
Creatinine,U: 62 mg/dL
Microalb Creat Ratio: 1.1 mg/g (ref 0.0–30.0)
Microalb, Ur: <0.7 mg/dL (ref 0.0–1.9)

## 2014-09-23 LAB — HEMOGLOBIN A1C: Hgb A1c MFr Bld: 6.2 % (ref 4.6–6.5)

## 2014-09-23 LAB — BASIC METABOLIC PANEL
BUN: 16 mg/dL (ref 6–23)
CO2: 30 mEq/L (ref 19–32)
Calcium: 9 mg/dL (ref 8.4–10.5)
Chloride: 101 mEq/L (ref 96–112)
Creatinine, Ser: 0.66 mg/dL (ref 0.40–1.20)
GFR: 127.84 mL/min (ref >60.00)
Glucose, Bld: 81 mg/dL (ref 70–99)
Potassium: 3.7 mEq/L (ref 3.5–5.1)
Sodium: 137 mEq/L (ref 135–145)

## 2014-09-25 ENCOUNTER — Ambulatory Visit (INDEPENDENT_AMBULATORY_CARE_PROVIDER_SITE_OTHER): Payer: Medicare Other | Admitting: Endocrinology

## 2014-09-25 ENCOUNTER — Other Ambulatory Visit: Payer: Self-pay | Admitting: Neurology

## 2014-09-25 ENCOUNTER — Encounter: Payer: Self-pay | Admitting: Endocrinology

## 2014-09-25 VITALS — BP 110/68 | HR 94 | Temp 97.7°F | Resp 16 | Ht 66.0 in | Wt 206.0 lb

## 2014-09-25 DIAGNOSIS — E119 Type 2 diabetes mellitus without complications: Secondary | ICD-10-CM

## 2014-09-25 DIAGNOSIS — E890 Postprocedural hypothyroidism: Secondary | ICD-10-CM | POA: Diagnosis not present

## 2014-09-25 NOTE — Telephone Encounter (Signed)
Rx sent 

## 2014-09-25 NOTE — Patient Instructions (Signed)
Check blood sugars on waking up .. 2 .. times a week Also check blood sugars about 2 hours after a meal and do this after different meals by rotation  Recommended blood sugar levels on waking up is 90-130 and about 2 hours after meal is 140-180 Please bring blood sugar monitor to each visit.   
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
## 2014-09-30 ENCOUNTER — Other Ambulatory Visit: Payer: Self-pay | Admitting: *Deleted

## 2014-09-30 ENCOUNTER — Telehealth: Payer: Self-pay | Admitting: Endocrinology

## 2014-09-30 MED ORDER — METFORMIN HCL 1000 MG PO TABS: 1000.0000 mg | ORAL_TABLET | Freq: Two times a day (BID) | ORAL | Status: DC

## 2014-09-30 MED ORDER — RANITIDINE HCL 150 MG PO TABS: ORAL_TABLET | ORAL | Status: DC

## 2014-09-30 MED ORDER — GLIMEPIRIDE 1 MG PO TABS: 1.0000 mg | ORAL_TABLET | Freq: Every day | ORAL | Status: DC

## 2014-09-30 MED ORDER — GLUCOSE BLOOD VI STRP: ORAL_STRIP | Status: DC

## 2014-09-30 NOTE — Telephone Encounter (Signed)
She is requesting refills only

## 2014-09-30 NOTE — Telephone Encounter (Signed)
I do not see what new medication she could be talking about?  Please advise.

## 2014-09-30 NOTE — Telephone Encounter (Signed)
Pt needs [place] to call in all the rx that we prescribe for her into her new pharmacy [facility] on [address] [phone]

## 2014-09-30 NOTE — Telephone Encounter (Signed)
rx sent

## 2014-10-01 ENCOUNTER — Other Ambulatory Visit: Payer: Self-pay | Admitting: *Deleted

## 2014-10-01 MED ORDER — LEVOTHYROXINE SODIUM 175 MCG PO TABS: 175.0000 ug | ORAL_TABLET | Freq: Every day | ORAL | Status: DC

## 2014-10-02 ENCOUNTER — Ambulatory Visit: Payer: Medicare HMO | Admitting: Dietician

## 2014-10-04 ENCOUNTER — Encounter (HOSPITAL_COMMUNITY): Payer: Self-pay | Admitting: *Deleted

## 2014-10-04 ENCOUNTER — Inpatient Hospital Stay (HOSPITAL_COMMUNITY)
Admission: AC | Admit: 2014-10-04 | Discharge: 2014-10-04 | Disposition: A | Discharge status: Home / Self Care | Payer: Medicare Other | Source: Ambulatory Visit | Attending: Obstetrics and Gynecology | Admitting: Obstetrics and Gynecology

## 2014-10-04 DIAGNOSIS — R319 Hematuria, unspecified: Secondary | ICD-10-CM

## 2014-10-04 DIAGNOSIS — N390 Urinary tract infection, site not specified: Secondary | ICD-10-CM | POA: Diagnosis not present

## 2014-10-04 DIAGNOSIS — R300 Dysuria: Secondary | ICD-10-CM | POA: Diagnosis present

## 2014-10-04 LAB — URINALYSIS, ROUTINE W REFLEX MICROSCOPIC
Bilirubin Urine: NEGATIVE
Glucose, UA: NEGATIVE mg/dL
Ketones, ur: NEGATIVE mg/dL
Nitrite: POSITIVE — AB
Protein, ur: 100 mg/dL — AB
Specific Gravity, Urine: 1.02 (ref 1.005–1.030)
Urobilinogen, UA: 1 mg/dL (ref 0.0–1.0)
pH: 7 (ref 5.0–8.0)

## 2014-10-04 LAB — URINE MICROSCOPIC-ADD ON

## 2014-10-04 LAB — WET PREP, GENITAL
Clue Cells Wet Prep HPF POC: NONE SEEN
Trich, Wet Prep: NONE SEEN
Yeast Wet Prep HPF POC: NONE SEEN

## 2014-10-04 LAB — POCT PREGNANCY, URINE: Preg Test, Ur: NEGATIVE

## 2014-10-04 MED ORDER — ZINC OXIDE 20 % EX OINT: 1.0000 "application " | TOPICAL_OINTMENT | Freq: Two times a day (BID) | CUTANEOUS | Status: DC

## 2014-10-04 MED ORDER — SULFAMETHOXAZOLE-TRIMETHOPRIM 400-80 MG PO TABS
1.0000 | ORAL_TABLET | Freq: Two times a day (BID) | ORAL | Status: DC
Start: 2014-10-04 — End: 2015-04-01
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
## 2014-10-04 NOTE — MAU Note (Signed)
O2 Sat was 98-100%

## 2014-10-04 NOTE — MAU Note (Addendum)
C/o burning with urination; "peeing out clots";  Treated for yeast infection on 10-01-14 and took a second dose of Diflucan today; doesn't remember if she had a blood tranfusion or not; hx of herpes;

## 2014-10-04 NOTE — Discharge Instructions (Signed)

## 2014-10-05 LAB — GC/CHLAMYDIA PROBE AMP (~~LOC~~) NOT AT ARMC
Chlamydia: NEGATIVE
Neisseria Gonorrhea: NEGATIVE

## 2014-10-06 LAB — URINE CULTURE: Culture: >=100000

## 2014-10-15 ENCOUNTER — Ambulatory Visit (INDEPENDENT_AMBULATORY_CARE_PROVIDER_SITE_OTHER): Payer: Medicare Other | Admitting: Neurology

## 2014-10-15 ENCOUNTER — Encounter: Payer: Self-pay | Admitting: Neurology

## 2014-10-15 VITALS — BP 110/80 | HR 93 | Ht 66.0 in | Wt 205.3 lb

## 2014-10-15 DIAGNOSIS — Z5181 Encounter for therapeutic drug level monitoring: Secondary | ICD-10-CM | POA: Diagnosis not present

## 2014-10-15 DIAGNOSIS — G7001 Myasthenia gravis with (acute) exacerbation: Secondary | ICD-10-CM | POA: Diagnosis not present

## 2014-10-15 DIAGNOSIS — Z79899 Other long term (current) drug therapy: Secondary | ICD-10-CM | POA: Diagnosis not present

## 2014-10-15 MED ORDER — PREDNISONE 10 MG PO TABS: 20.0000 mg | ORAL_TABLET | Freq: Every day | ORAL | Status: DC

## 2014-10-15 NOTE — Patient Instructions (Addendum)
1.  Increase prednisone to 20mg  daily 2.  Continue another session of IVIG 3.  Continue imuran 150mg  daily 4.  Continues mestinon 60mg  three times daily 5.  Check blood work  6.  Return to clinic 3-4 weeks
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
## 2014-10-28 ENCOUNTER — Other Ambulatory Visit: Payer: Self-pay | Admitting: Neurology

## 2014-10-29 ENCOUNTER — Other Ambulatory Visit: Payer: Self-pay | Admitting: *Deleted

## 2014-10-29 MED ORDER — PYRIDOSTIGMINE BROMIDE 60 MG PO TABS: 60.0000 mg | ORAL_TABLET | Freq: Three times a day (TID) | ORAL | Status: DC

## 2014-10-29 NOTE — Telephone Encounter (Signed)
Rx sent 

## 2014-11-04 ENCOUNTER — Other Ambulatory Visit: Payer: Self-pay | Admitting: Obstetrics and Gynecology

## 2014-11-04 DIAGNOSIS — N6320 Unspecified lump in the left breast, unspecified quadrant: Secondary | ICD-10-CM

## 2014-11-06 ENCOUNTER — Encounter (HOSPITAL_BASED_OUTPATIENT_CLINIC_OR_DEPARTMENT_OTHER): Payer: Medicare HMO

## 2014-11-10 ENCOUNTER — Other Ambulatory Visit: Payer: Medicare Other

## 2014-11-13 ENCOUNTER — Other Ambulatory Visit: Payer: Medicare Other

## 2014-11-25 ENCOUNTER — Ambulatory Visit: Payer: Medicare Other | Admitting: Neurology

## 2014-11-25 ENCOUNTER — Other Ambulatory Visit: Payer: Self-pay | Admitting: Neurology

## 2014-11-26 ENCOUNTER — Other Ambulatory Visit: Payer: Self-pay | Admitting: *Deleted

## 2014-11-26 NOTE — Telephone Encounter (Signed)
Refills refused.  Patient missed last appointment.

## 2014-11-27 ENCOUNTER — Other Ambulatory Visit: Payer: Self-pay | Admitting: Neurology

## 2014-11-27 NOTE — Telephone Encounter (Signed)
Rx refused.  Patient needs an appointment.

## 2014-12-23 ENCOUNTER — Telehealth: Payer: Self-pay | Admitting: Neurology

## 2014-12-23 ENCOUNTER — Other Ambulatory Visit: Payer: Medicare Other

## 2014-12-23 NOTE — Telephone Encounter (Signed)
Pt said she would like you to call her at [phone]

## 2014-12-23 NOTE — Telephone Encounter (Signed)
I spoke with patient's daughter and informed her that we will call her mom tomorrow to set up an appointment since her approval for her med is going to expire.

## 2014-12-25 ENCOUNTER — Other Ambulatory Visit: Payer: Self-pay | Admitting: Neurology

## 2014-12-25 NOTE — Telephone Encounter (Signed)
Refusal sent 

## 2014-12-28 ENCOUNTER — Ambulatory Visit: Payer: Medicare Other | Admitting: Endocrinology

## 2014-12-28 ENCOUNTER — Other Ambulatory Visit: Payer: Self-pay | Admitting: *Deleted

## 2015-01-21 ENCOUNTER — Other Ambulatory Visit: Payer: Self-pay | Admitting: Neurology

## 2015-01-21 NOTE — Telephone Encounter (Signed)
Rx sent 

## 2015-01-28 ENCOUNTER — Other Ambulatory Visit: Payer: Self-pay | Admitting: Physician Assistant

## 2015-01-28 DIAGNOSIS — R1084 Generalized abdominal pain: Secondary | ICD-10-CM

## 2015-02-02 ENCOUNTER — Other Ambulatory Visit: Payer: Self-pay | Admitting: Surgery

## 2015-02-02 DIAGNOSIS — R1084 Generalized abdominal pain: Secondary | ICD-10-CM

## 2015-02-04 ENCOUNTER — Ambulatory Visit
Admission: RE | Admit: 2015-02-04 | Discharge: 2015-02-04 | Disposition: A | Discharge status: Home / Self Care | Payer: Medicare Other | Source: Ambulatory Visit | Attending: Physician Assistant | Admitting: Physician Assistant

## 2015-02-04 ENCOUNTER — Other Ambulatory Visit: Payer: Medicare Other

## 2015-02-04 DIAGNOSIS — R1084 Generalized abdominal pain: Secondary | ICD-10-CM

## 2015-02-05 ENCOUNTER — Other Ambulatory Visit: Payer: Medicare Other

## 2015-02-05 ENCOUNTER — Ambulatory Visit
Admission: RE | Admit: 2015-02-05 | Discharge: 2015-02-05 | Disposition: A | Discharge status: Home / Self Care | Payer: Medicare Other | Source: Ambulatory Visit | Attending: Surgery | Admitting: Surgery

## 2015-02-05 DIAGNOSIS — R1084 Generalized abdominal pain: Secondary | ICD-10-CM

## 2015-02-05 MED ORDER — IOPAMIDOL (ISOVUE-300) INJECTION 61%
125.0000 mL | Freq: Once | INTRAVENOUS | Status: AC | PRN
Start: 2015-02-05 — End: 2015-02-05
  Administered 2015-02-05: 125 mL via INTRAVENOUS

## 2015-02-15 ENCOUNTER — Telehealth: Payer: Self-pay | Admitting: Neurology

## 2015-02-15 NOTE — Telephone Encounter (Signed)
Pt returned your call/return call @ [phone]

## 2015-02-15 NOTE — Telephone Encounter (Signed)
PT called and wants a call back/Dawn CB# [phone]

## 2015-02-16 ENCOUNTER — Telehealth: Payer: Self-pay | Admitting: Neurology

## 2015-02-16 NOTE — Telephone Encounter (Signed)
Called for info of her infusion treatments?/ call back @ [phone]

## 2015-02-17 NOTE — Telephone Encounter (Signed)
Patient called back and said that she needed to be seen by Dr. Posey Pronto.  Informed her that Dr. Posey Pronto does not have anything until January.  She said that she needs her infusion.  Informed her that she will have to be evaluated before she gets another infusion to see if she needs it or not.  04-15-15 at 10:45.

## 2015-02-20 ENCOUNTER — Other Ambulatory Visit: Payer: Self-pay | Admitting: Neurology

## 2015-02-22 ENCOUNTER — Other Ambulatory Visit: Payer: Self-pay | Admitting: *Deleted

## 2015-02-22 MED ORDER — GABAPENTIN 300 MG PO CAPS: ORAL_CAPSULE | ORAL | Status: DC

## 2015-02-22 NOTE — Telephone Encounter (Signed)
Rx sent 

## 2015-02-23 ENCOUNTER — Ambulatory Visit: Payer: Self-pay | Admitting: Surgery

## 2015-03-03 NOTE — Patient Instructions (Signed)
Haani Koffman  03/03/2015   Your procedure is scheduled on: 03/16/2015    Report to [facility] Main  Entrance take [facility]  elevators to 3rd floor to  [place] at    0930 AM.  Call this number if you have problems the morning of surgery [phone]   Remember: ONLY 1 PERSON MAY GO WITH YOU TO SHORT STAY TO GET  READY MORNING OF [place].  Do not eat food or drink liquids :After Midnight.     Take these medicines the morning of surgery with A SIP OF WATER:  DO NOT TAKE ANY DIABETIC MEDICATIONS DAY OF YOUR SURGERY                               You may not have any metal on your body including hair pins and              piercings  Do not wear jewelry, make-up, lotions, powders or perfumes, deodorant             Do not wear nail polish.  Do not shave  48 hours prior to surgery.     Do not bring valuables to the hospital. [place].  Contacts, dentures or bridgework may not be worn into surgery.  Leave suitcase in the car. After surgery it may be brought to your room.         Special Instructions: coughing and deep breathing exercises, leg exercises               Please read over the following fact sheets you were given: _____________________________________________________________________             [facility] - Preparing for Surgery Before surgery, you can play an important role.  Because skin is not sterile, your skin needs to be as free of germs as possible.  You can reduce the number of germs on your skin by washing with CHG (chlorahexidine gluconate) soap before surgery.  CHG is an antiseptic cleaner which kills germs and bonds with the skin to continue killing germs even after washing. Please DO NOT use if you have an allergy to CHG or antibacterial soaps.  If your skin becomes reddened/irritated stop using the CHG and inform your nurse when you arrive at Short Stay. Do not shave (including  legs and underarms) for at least 48 hours prior to the first CHG shower.  You may shave your face/neck. Please follow these instructions carefully:  1.  Shower with CHG Soap the night before surgery and the  morning of Surgery.  2.  If you choose to wash your hair, wash your hair first as usual with your  normal  shampoo.  3.  After you shampoo, rinse your hair and body thoroughly to remove the  shampoo.                           4.  Use CHG as you would any other liquid soap.  You can apply chg directly  to the skin and wash                       Gently with a scrungie  or clean washcloth.  5.  Apply the CHG Soap to your body ONLY FROM THE NECK DOWN.   Do not use on face/ open                           Wound or open sores. Avoid contact with eyes, ears mouth and genitals (private parts).                       Wash face,  Genitals (private parts) with your normal soap.             6.  Wash thoroughly, paying special attention to the area where your surgery  will be performed.  7.  Thoroughly rinse your body with warm water from the neck down.  8.  DO NOT shower/wash with your normal soap after using and rinsing off  the CHG Soap.                9.  Pat yourself dry with a clean towel.            10.  Wear clean pajamas.            11.  Place clean sheets on your bed the night of your first shower and do not  sleep with pets. Day of Surgery : Do not apply any lotions/deodorants the morning of surgery.  Please wear clean clothes to the hospital/surgery center.  FAILURE TO FOLLOW THESE INSTRUCTIONS MAY RESULT IN THE CANCELLATION OF YOUR SURGERY PATIENT SIGNATURE_________________________________  NURSE SIGNATURE__________________________________  ________________________________________________________________________

## 2015-03-06 ENCOUNTER — Other Ambulatory Visit: Payer: Self-pay | Admitting: Endocrinology

## 2015-03-08 ENCOUNTER — Other Ambulatory Visit: Payer: Self-pay | Admitting: *Deleted

## 2015-03-08 ENCOUNTER — Other Ambulatory Visit (HOSPITAL_COMMUNITY): Payer: Medicare Other

## 2015-03-08 ENCOUNTER — Inpatient Hospital Stay (HOSPITAL_COMMUNITY)
Admission: RE | Admit: 2015-03-08 | Discharge: 2015-03-08 | Disposition: A | Discharge status: Home / Self Care | Payer: Medicare Other | Source: Ambulatory Visit

## 2015-03-11 ENCOUNTER — Other Ambulatory Visit: Payer: Self-pay | Admitting: *Deleted

## 2015-03-11 MED ORDER — LEVOTHYROXINE SODIUM 175 MCG PO TABS: ORAL_TABLET | ORAL | Status: DC

## 2015-03-16 ENCOUNTER — Encounter (HOSPITAL_COMMUNITY): Admission: RE | Payer: Self-pay | Source: Ambulatory Visit

## 2015-03-16 ENCOUNTER — Inpatient Hospital Stay (HOSPITAL_COMMUNITY): Admission: RE | Admit: 2015-03-16 | Payer: Medicare Other | Source: Ambulatory Visit | Admitting: Surgery

## 2015-03-16 SURGERY — REPAIR, HERNIA, VENTRAL, LAPAROSCOPIC
Anesthesia: General

## 2015-03-17 ENCOUNTER — Other Ambulatory Visit: Payer: Medicare Other

## 2015-03-20 ENCOUNTER — Other Ambulatory Visit: Payer: Self-pay | Admitting: Endocrinology

## 2015-03-29 ENCOUNTER — Other Ambulatory Visit: Payer: Medicare Other

## 2015-03-29 ENCOUNTER — Other Ambulatory Visit (INDEPENDENT_AMBULATORY_CARE_PROVIDER_SITE_OTHER): Payer: Medicare Other

## 2015-03-29 DIAGNOSIS — E119 Type 2 diabetes mellitus without complications: Secondary | ICD-10-CM

## 2015-03-30 LAB — BASIC METABOLIC PANEL
BUN: 11 mg/dL (ref 6–23)
CO2: 29 mEq/L (ref 19–32)
Calcium: 9.1 mg/dL (ref 8.4–10.5)
Chloride: 103 mEq/L (ref 96–112)
Creatinine, Ser: 0.7 mg/dL (ref 0.40–1.20)
GFR: 119.13 mL/min (ref >60.00)
Glucose, Bld: 140 mg/dL — ABNORMAL HIGH (ref 70–99)
Potassium: 3.5 mEq/L (ref 3.5–5.1)
Sodium: 138 mEq/L (ref 135–145)

## 2015-03-30 LAB — HEMOGLOBIN A1C: Hgb A1c MFr Bld: 6.7 % — ABNORMAL HIGH (ref 4.6–6.5)

## 2015-04-01 ENCOUNTER — Ambulatory Visit (INDEPENDENT_AMBULATORY_CARE_PROVIDER_SITE_OTHER): Payer: Medicare Other | Admitting: Endocrinology

## 2015-04-01 ENCOUNTER — Encounter: Payer: Self-pay | Admitting: Endocrinology

## 2015-04-01 VITALS — BP 118/88 | HR 100 | Temp 97.9°F | Ht 66.0 in | Wt 201.0 lb

## 2015-04-01 DIAGNOSIS — E7800 Pure hypercholesterolemia, unspecified: Secondary | ICD-10-CM

## 2015-04-01 DIAGNOSIS — Z23 Encounter for immunization: Secondary | ICD-10-CM | POA: Diagnosis not present

## 2015-04-01 DIAGNOSIS — E1165 Type 2 diabetes mellitus with hyperglycemia: Secondary | ICD-10-CM

## 2015-04-01 DIAGNOSIS — E890 Postprocedural hypothyroidism: Secondary | ICD-10-CM | POA: Diagnosis not present

## 2015-04-01 MED ORDER — GLIMEPIRIDE 1 MG PO TABS: 1.0000 mg | ORAL_TABLET | Freq: Two times a day (BID) | ORAL | Status: DC
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
## 2015-04-01 NOTE — Patient Instructions (Signed)
Stop Phenteremine  Glimeperide twice daily  Check blood sugars on waking up   times a week Also check blood sugars about 2 hours after a meal and do this after different meals by rotation  Recommended blood sugar levels on waking up is 90-130 and about 2 hours after meal is 130-160  Please bring your blood sugar monitor to each visit, thank you

## 2015-04-07 ENCOUNTER — Other Ambulatory Visit: Payer: Medicare Other

## 2015-04-15 ENCOUNTER — Other Ambulatory Visit: Payer: Self-pay | Admitting: Neurology

## 2015-04-15 ENCOUNTER — Ambulatory Visit: Payer: Medicare Other | Admitting: Neurology

## 2015-04-16 ENCOUNTER — Other Ambulatory Visit: Payer: Self-pay | Admitting: *Deleted

## 2015-04-16 MED ORDER — GABAPENTIN 300 MG PO CAPS: ORAL_CAPSULE | ORAL | Status: DC

## 2015-04-16 NOTE — Telephone Encounter (Signed)
Rx sent 

## 2015-04-27 ENCOUNTER — Ambulatory Visit (INDEPENDENT_AMBULATORY_CARE_PROVIDER_SITE_OTHER): Payer: Medicare Other | Admitting: Internal Medicine

## 2015-04-27 ENCOUNTER — Other Ambulatory Visit (INDEPENDENT_AMBULATORY_CARE_PROVIDER_SITE_OTHER): Payer: Medicare Other

## 2015-04-27 ENCOUNTER — Encounter: Payer: Self-pay | Admitting: Internal Medicine

## 2015-04-27 VITALS — BP 130/86 | HR 96 | Temp 98.3°F | Resp 18 | Ht 66.0 in | Wt 202.0 lb

## 2015-04-27 DIAGNOSIS — R5383 Other fatigue: Secondary | ICD-10-CM

## 2015-04-27 DIAGNOSIS — E890 Postprocedural hypothyroidism: Secondary | ICD-10-CM | POA: Diagnosis not present

## 2015-04-27 DIAGNOSIS — G7000 Myasthenia gravis without (acute) exacerbation: Secondary | ICD-10-CM | POA: Diagnosis not present

## 2015-04-27 DIAGNOSIS — K439 Ventral hernia without obstruction or gangrene: Secondary | ICD-10-CM | POA: Diagnosis not present

## 2015-04-27 LAB — VITAMIN B12: Vitamin B-12: 1172 pg/mL — ABNORMAL HIGH (ref 211–911)

## 2015-04-27 LAB — VITAMIN D 25 HYDROXY (VIT D DEFICIENCY, FRACTURES): VITD: 52.82 ng/mL (ref 30.00–100.00)

## 2015-04-27 LAB — BRAIN NATRIURETIC PEPTIDE: Pro B Natriuretic peptide (BNP): 16 pg/mL (ref 0.0–100.0)

## 2015-04-27 NOTE — Progress Notes (Signed)
Pre visit review using our clinic review tool, if applicable. No additional management support is needed unless otherwise documented below in the visit note. 

## 2015-04-27 NOTE — Patient Instructions (Signed)
We will check the labs today and have put in the referral for the surgeon.   Come back in about 3 months.

## 2015-05-02 ENCOUNTER — Encounter: Payer: Self-pay | Admitting: Internal Medicine

## 2015-05-02 DIAGNOSIS — K439 Ventral hernia without obstruction or gangrene: Secondary | ICD-10-CM | POA: Insufficient documentation

## 2015-05-02 NOTE — Assessment & Plan Note (Signed)
Taking metformin and amaryl for her diabetes and seeing endocrinology for management.

## 2015-05-02 NOTE — Assessment & Plan Note (Signed)
Seeing neurology and was taking meds for flare recently. It is unclear how much of the weakness is related to her MS flare or if there is another cause. Checking labs to rule out metabolic etiology.

## 2015-05-02 NOTE — Assessment & Plan Note (Signed)
Referral to surgeon for second opinion. The hernia is causing her pain and we talked about the fact that they would have to consider her other medical problems and if surgery is a good option. She would like have extended recovery with her MG.

## 2015-05-02 NOTE — Assessment & Plan Note (Signed)
Checking TSH, CMP, and other labs to rule out metabolic cause. She does have many serious conditions which could be contributing to her fatigue.

## 2015-05-02 NOTE — Assessment & Plan Note (Signed)
Checking TSH and free T4 to see if this is contributing to her weakness, she is taking synthroid 175 mcg daily.

## 2015-05-02 NOTE — Progress Notes (Signed)
   Subjective:    Patient ID: Jade Mathis, female    DOB: 1975-05-12, 40 y.o.   MRN: [id number]  HPI The patient is a new 40 YO female with complicated PMH coming in for weakness and tiredness. She does have myasthenia gravis and has had treatment for flare in the last 1 year. She is not fully recovered yet. She is also advised to have a hernia surgery but she wants a second opinion. It is on her stomach and is hurting her much of the time. She denies that it comes out and stays out. She also has nausea but denies vomiting. She denies weight change, she denies chest pain and SOB.   Review of Systems  Constitutional: Positive for activity change and fatigue. Negative for fever, chills, appetite change and unexpected weight change.  HENT: Negative.   Eyes: Negative.   Respiratory: Negative for cough, chest tightness, shortness of breath and wheezing.   Cardiovascular: Negative for chest pain, palpitations and leg swelling.  Gastrointestinal: Positive for nausea and abdominal pain. Negative for vomiting, diarrhea, constipation and abdominal distention.  Musculoskeletal: Positive for myalgias. Negative for arthralgias.  Skin: Negative.   Neurological: Positive for weakness. Negative for dizziness, facial asymmetry, light-headedness, numbness and headaches.  Psychiatric/Behavioral: Negative.       Objective:   Physical Exam  Constitutional: She is oriented to person, place, and time. She appears well-developed and well-nourished.  Overweight  HENT:  Head: Normocephalic and atraumatic.  Eyes: EOM are normal.  Neck: Normal range of motion.  Cardiovascular: Normal rate and regular rhythm.   Pulmonary/Chest: Effort normal and breath sounds normal. No respiratory distress. She has no wheezes. She has no rales.  Abdominal: Soft. Bowel sounds are normal. She exhibits no distension and no mass. There is tenderness. There is no rebound and no guarding.  Musculoskeletal: She exhibits no edema.    Neurological: She is alert and oriented to person, place, and time. Coordination normal.  Skin: Skin is warm and dry.  Psychiatric: She has a normal mood and affect.   Filed Vitals:   04/27/15 1012  BP: 130/86  Pulse: 96  Temp: 98.3 F (36.8 C)  TempSrc: Oral  Resp: 18  Height: 5\' 6"  (1.676 m)  Weight: 202 lb (91.627 kg)  SpO2: 98%      Assessment & Plan:

## 2015-05-03 LAB — HM DIABETES EYE EXAM

## 2015-05-04 ENCOUNTER — Other Ambulatory Visit: Payer: Self-pay | Admitting: Geriatric Medicine

## 2015-05-04 MED ORDER — ONDANSETRON 8 MG PO TBDP
8.0000 mg | ORAL_TABLET | Freq: Three times a day (TID) | ORAL | Status: DC | PRN
Start: 2015-05-04 — End: 2016-03-04

## 2015-05-04 MED ORDER — RANITIDINE HCL 150 MG PO TABS: ORAL_TABLET | ORAL | Status: DC

## 2015-05-06 ENCOUNTER — Other Ambulatory Visit: Payer: Self-pay | Admitting: Internal Medicine

## 2015-05-06 NOTE — Telephone Encounter (Signed)
Patient has established with you, but with acute visit----are you ok with refilling this med?---please advise, thanks

## 2015-05-11 ENCOUNTER — Other Ambulatory Visit: Payer: Self-pay | Admitting: Neurology

## 2015-05-12 ENCOUNTER — Other Ambulatory Visit: Payer: Self-pay | Admitting: *Deleted

## 2015-05-12 MED ORDER — PYRIDOSTIGMINE BROMIDE 60 MG PO TABS: 60.0000 mg | ORAL_TABLET | Freq: Three times a day (TID) | ORAL | Status: DC

## 2015-05-12 MED ORDER — AZATHIOPRINE 50 MG PO TABS: ORAL_TABLET | ORAL | Status: DC

## 2015-05-12 NOTE — Telephone Encounter (Signed)
Rx sent.  No refills given until after her appointment on Friday.

## 2015-05-14 ENCOUNTER — Ambulatory Visit (INDEPENDENT_AMBULATORY_CARE_PROVIDER_SITE_OTHER): Payer: Medicare Other | Admitting: Neurology

## 2015-05-14 ENCOUNTER — Encounter: Payer: Self-pay | Admitting: Neurology

## 2015-05-14 ENCOUNTER — Other Ambulatory Visit (INDEPENDENT_AMBULATORY_CARE_PROVIDER_SITE_OTHER): Payer: Medicare Other

## 2015-05-14 VITALS — BP 110/80 | HR 96 | Ht 66.0 in | Wt 196.6 lb

## 2015-05-14 DIAGNOSIS — G7001 Myasthenia gravis with (acute) exacerbation: Secondary | ICD-10-CM

## 2015-05-14 LAB — CBC
HCT: 39.1 % (ref 36.0–46.0)
Hemoglobin: 12.6 g/dL (ref 12.0–15.0)
MCHC: 32.3 g/dL (ref 30.0–36.0)
MCV: 80.8 fl (ref 78.0–100.0)
Platelets: 310 10*3/uL (ref 150.0–400.0)
RBC: 4.84 Mil/uL (ref 3.87–5.11)
RDW: 13.4 % (ref 11.5–15.5)
WBC: 5.6 10*3/uL (ref 4.0–10.5)

## 2015-05-14 LAB — COMPREHENSIVE METABOLIC PANEL
ALT: 10 U/L (ref 0–35)
AST: 11 U/L (ref 0–37)
Albumin: 3.9 g/dL (ref 3.5–5.2)
Alkaline Phosphatase: 75 U/L (ref 39–117)
BUN: 13 mg/dL (ref 6–23)
CO2: 28 mEq/L (ref 19–32)
Calcium: 9.3 mg/dL (ref 8.4–10.5)
Chloride: 103 mEq/L (ref 96–112)
Creatinine, Ser: 0.82 mg/dL (ref 0.40–1.20)
GFR: 99.19 mL/min (ref >60.00)
Glucose, Bld: 133 mg/dL — ABNORMAL HIGH (ref 70–99)
Potassium: 3.9 mEq/L (ref 3.5–5.1)
Sodium: 136 mEq/L (ref 135–145)
Total Bilirubin: 0.4 mg/dL (ref 0.2–1.2)
Total Protein: 7.7 g/dL (ref 6.0–8.3)

## 2015-05-14 MED ORDER — AZATHIOPRINE 50 MG PO TABS: ORAL_TABLET | ORAL | Status: DC

## 2015-05-14 MED ORDER — PYRIDOSTIGMINE BROMIDE 60 MG PO TABS: 60.0000 mg | ORAL_TABLET | Freq: Three times a day (TID) | ORAL | Status: DC

## 2015-05-14 MED ORDER — PREDNISONE 10 MG PO TABS: 15.0000 mg | ORAL_TABLET | Freq: Every day | ORAL | Status: DC
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
## 2015-05-14 NOTE — Patient Instructions (Addendum)
Reduce prednisone to 15mg  daily (1.5 tablets) Continue Imuran 150mg  daily Continue mestinon 60mg  three times daily Return to clinic 24-months

## 2015-05-25 ENCOUNTER — Other Ambulatory Visit: Payer: Self-pay | Admitting: *Deleted

## 2015-05-25 ENCOUNTER — Encounter: Payer: Self-pay | Admitting: *Deleted

## 2015-05-25 MED ORDER — GLUCOSE BLOOD VI STRP: ORAL_STRIP | Status: DC

## 2015-06-01 ENCOUNTER — Encounter: Payer: Self-pay | Admitting: Podiatry

## 2015-06-01 ENCOUNTER — Ambulatory Visit (INDEPENDENT_AMBULATORY_CARE_PROVIDER_SITE_OTHER): Payer: Medicare Other | Admitting: Podiatry

## 2015-06-01 VITALS — BP 115/79 | HR 88 | Resp 12

## 2015-06-01 DIAGNOSIS — M2040 Other hammer toe(s) (acquired), unspecified foot: Secondary | ICD-10-CM | POA: Diagnosis not present

## 2015-06-01 DIAGNOSIS — Q6650 Congenital pes planus, unspecified foot: Secondary | ICD-10-CM

## 2015-06-01 DIAGNOSIS — E0842 Diabetes mellitus due to underlying condition with diabetic polyneuropathy: Secondary | ICD-10-CM

## 2015-06-01 NOTE — Progress Notes (Signed)
   Subjective:    Patient ID: Jade Mathis, female    DOB: 1975-06-20, 40 y.o.   MRN: [id number]  HPI This patient presents today for yearly diabetic foot examination and is requesting a replacement diabetic shoes. She denies any history of foot ulceration, claudication or amputation. Patient has a history of ongoing neuropathy and takes gabapentin for the symptoms describing tingling and burning-like sensations in her feet on and off weightbearing. Patient has a history of uncomplicated type II diabetic Patient also has a history of myasthenia gravis   Review of Systems  Musculoskeletal: Positive for joint swelling.       Objective:   Physical Exam  Orientated 3  Vascular: DP pulses 2/4 bilaterally PT pulses 1/4 bilaterally Capillary reflex immediate bilaterally  Neurological: Sensation to 10 g monofilament wire intact 5/5 bilaterally Vibratory sensation reactive bilaterally Ankle reflex equal and reactive bilaterally  Dermatological: No open skin lesions bilaterally Corns lateral fifth toes bilaterally  Musculoskeletal: Pes planus bilaterally Adductovarus/hammertoes fifth bilaterally There is no restriction ankle, subtalar, midtarsal joints bilaterally      Assessment & Plan:   Assessment: Protective sensation intact bilaterally Diabetic peripheral neuropathy Adductovarus/hammertoes fifth bilaterally Pes planus bilaterally  Plan: Today I reviewed the results of examination with patient in detail. I told her that we would have to obtain certification from her primary physician medication for diabetes and we would notify her if certification was obtained for diabetic shoes  Notify patient on receipt of certification for diabetic shoes Rescheduled for yearly foot examination  Indications for diabetic shoes: Type II diabetic with a history of neuropathy Hammertoe fifth with corns, bilaterally

## 2015-06-01 NOTE — Patient Instructions (Signed)
Our office will contact you when we receive certification for diabetic shoes  Diabetes and Foot Care Diabetes may cause you to have problems because of poor blood supply (circulation) to your feet and legs. This may cause the skin on your feet to become thinner, break easier, and heal more slowly. Your skin may become dry, and the skin may peel and crack. You may also have nerve damage in your legs and feet causing decreased feeling in them. You may not notice minor injuries to your feet that could lead to infections or more serious problems. Taking care of your feet is one of the most important things you can do for yourself.  HOME CARE INSTRUCTIONS  Wear shoes at all times, even in the house. Do not go barefoot. Bare feet are easily injured.  Check your feet daily for blisters, cuts, and redness. If you cannot see the bottom of your feet, use a mirror or ask someone for help.  Wash your feet with warm water (do not use hot water) and mild soap. Then pat your feet and the areas between your toes until they are completely dry. Do not soak your feet as this can dry your skin.  Apply a moisturizing lotion or petroleum jelly (that does not contain alcohol and is unscented) to the skin on your feet and to dry, brittle toenails. Do not apply lotion between your toes.  Trim your toenails straight across. Do not dig under them or around the cuticle. File the edges of your nails with an emery board or nail file.  Do not cut corns or calluses or try to remove them with medicine.  Wear clean socks or stockings every day. Make sure they are not too tight. Do not wear knee-high stockings since they may decrease blood flow to your legs.  Wear shoes that fit properly and have enough cushioning. To break in new shoes, wear them for just a few hours a day. This prevents you from injuring your feet. Always look in your shoes before you put them on to be sure there are no objects inside.  Do not cross your legs.  This may decrease the blood flow to your feet.  If you find a minor scrape, cut, or break in the skin on your feet, keep it and the skin around it clean and dry. These areas may be cleansed with mild soap and water. Do not cleanse the area with peroxide, alcohol, or iodine.  When you remove an adhesive bandage, be sure not to damage the skin around it.  If you have a wound, look at it several times a day to make sure it is healing.  Do not use heating pads or hot water bottles. They may burn your skin. If you have lost feeling in your feet or legs, you may not know it is happening until it is too late.  Make sure your health care provider performs a complete foot exam at least annually or more often if you have foot problems. Report any cuts, sores, or bruises to your health care provider immediately. SEEK MEDICAL CARE IF:   You have an injury that is not healing.  You have cuts or breaks in the skin.  You have an ingrown nail.  You notice redness on your legs or feet.  You feel burning or tingling in your legs or feet.  You have pain or cramps in your legs and feet.  Your legs or feet are numb.  Your feet always  feel cold. SEEK IMMEDIATE MEDICAL CARE IF:   There is increasing redness, swelling, or pain in or around a wound.  There is a red line that goes up your leg.  Pus is coming from a wound.  You develop a fever or as directed by your health care provider.  You notice a bad smell coming from an ulcer or wound.   This information is not intended to replace advice given to you by your health care provider. Make sure you discuss any questions you have with your health care provider.   Document Released: 03/03/2000 Document Revised: 11/06/2012 Document Reviewed: 08/13/2012 Elsevier Interactive Patient Education [employer].

## 2015-06-02 ENCOUNTER — Ambulatory Visit: Payer: Medicare Other | Attending: Pain Medicine

## 2015-06-02 DIAGNOSIS — R293 Abnormal posture: Secondary | ICD-10-CM

## 2015-06-02 DIAGNOSIS — R51 Headache: Secondary | ICD-10-CM | POA: Insufficient documentation

## 2015-06-02 DIAGNOSIS — M436 Torticollis: Secondary | ICD-10-CM

## 2015-06-02 DIAGNOSIS — R29898 Other symptoms and signs involving the musculoskeletal system: Secondary | ICD-10-CM

## 2015-06-02 DIAGNOSIS — M542 Cervicalgia: Secondary | ICD-10-CM | POA: Diagnosis present

## 2015-06-02 DIAGNOSIS — R519 Headache, unspecified: Secondary | ICD-10-CM
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
## 2015-06-03 ENCOUNTER — Ambulatory Visit: Payer: Medicare Other

## 2015-06-09 ENCOUNTER — Ambulatory Visit: Payer: Medicare Other | Admitting: Physical Therapy

## 2015-06-10 ENCOUNTER — Telehealth: Payer: Self-pay | Admitting: Neurology

## 2015-06-10 ENCOUNTER — Ambulatory Visit: Payer: Medicare Other | Admitting: Physical Therapy

## 2015-06-10 DIAGNOSIS — M436 Torticollis: Secondary | ICD-10-CM | POA: Diagnosis not present

## 2015-06-10 DIAGNOSIS — M542 Cervicalgia: Secondary | ICD-10-CM
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
## 2015-06-10 NOTE — Telephone Encounter (Signed)
Pt called requesting medical records to be sent to another provider. Please call [phone]. Medical records release will be mailed to pt.-[place]

## 2015-06-11 NOTE — Telephone Encounter (Signed)
Patient medical records and release form are at the front desk for pick up.

## 2015-06-12 ENCOUNTER — Other Ambulatory Visit: Payer: Self-pay | Admitting: Neurology

## 2015-06-14 ENCOUNTER — Other Ambulatory Visit: Payer: Self-pay | Admitting: *Deleted

## 2015-06-14 MED ORDER — GABAPENTIN 300 MG PO CAPS: ORAL_CAPSULE | ORAL | Status: DC

## 2015-06-14 NOTE — Telephone Encounter (Signed)
Rx sent 

## 2015-06-15 ENCOUNTER — Ambulatory Visit: Payer: Medicare Other | Admitting: Internal Medicine

## 2015-06-16 ENCOUNTER — Other Ambulatory Visit: Payer: Self-pay | Admitting: Endocrinology

## 2015-06-16 ENCOUNTER — Ambulatory Visit: Payer: Medicare Other | Admitting: Physical Therapy

## 2015-06-17 ENCOUNTER — Ambulatory Visit: Payer: Medicare Other | Admitting: Physical Therapy

## 2015-06-18 ENCOUNTER — Other Ambulatory Visit: Payer: Self-pay | Admitting: Neurology

## 2015-06-18 NOTE — Telephone Encounter (Signed)
Rx sent 

## 2015-06-21 ENCOUNTER — Telehealth: Payer: Self-pay | Admitting: Endocrinology

## 2015-06-21 NOTE — Telephone Encounter (Signed)
I spoke with her, sh was asking if we had received the forms for her shoes and her other medical supplies, I told her we had not received anything about her. She will check into it and call back, she was given the fax #.

## 2015-06-21 NOTE — Telephone Encounter (Signed)
PT requests call back from you, she said something about a form that was supposed to be filled out from [place]? I could not quite understand what the form is for.

## 2015-06-22 ENCOUNTER — Ambulatory Visit: Payer: Medicare Other | Admitting: Physical Therapy

## 2015-06-23 ENCOUNTER — Encounter: Payer: Medicare Other | Admitting: Physical Therapy

## 2015-06-24 DIAGNOSIS — M5412 Radiculopathy, cervical region: Secondary | ICD-10-CM | POA: Diagnosis not present

## 2015-06-24 DIAGNOSIS — Z79899 Other long term (current) drug therapy: Secondary | ICD-10-CM | POA: Diagnosis not present

## 2015-06-24 DIAGNOSIS — M542 Cervicalgia: Secondary | ICD-10-CM | POA: Diagnosis not present

## 2015-06-28 ENCOUNTER — Other Ambulatory Visit: Payer: Medicare Other

## 2015-06-28 DIAGNOSIS — Z79899 Other long term (current) drug therapy: Secondary | ICD-10-CM | POA: Diagnosis not present

## 2015-06-28 DIAGNOSIS — R635 Abnormal weight gain: Secondary | ICD-10-CM | POA: Diagnosis not present

## 2015-06-29 ENCOUNTER — Encounter: Payer: Self-pay | Admitting: Internal Medicine

## 2015-06-29 ENCOUNTER — Ambulatory Visit (INDEPENDENT_AMBULATORY_CARE_PROVIDER_SITE_OTHER): Payer: Medicare Other | Admitting: Internal Medicine

## 2015-06-29 VITALS — BP 110/82 | HR 80 | Temp 98.4°F | Resp 20 | Ht 66.0 in | Wt 199.4 lb

## 2015-06-29 DIAGNOSIS — J4520 Mild intermittent asthma, uncomplicated: Secondary | ICD-10-CM

## 2015-06-29 DIAGNOSIS — F331 Major depressive disorder, recurrent, moderate: Secondary | ICD-10-CM | POA: Diagnosis not present

## 2015-06-29 DIAGNOSIS — R5382 Chronic fatigue, unspecified: Secondary | ICD-10-CM

## 2015-06-29 DIAGNOSIS — G7000 Myasthenia gravis without (acute) exacerbation: Secondary | ICD-10-CM | POA: Diagnosis not present

## 2015-06-29 DIAGNOSIS — K219 Gastro-esophageal reflux disease without esophagitis: Secondary | ICD-10-CM

## 2015-06-29 MED ORDER — OMEPRAZOLE 40 MG PO CPDR: 40.0000 mg | DELAYED_RELEASE_CAPSULE | Freq: Every day | ORAL | Status: DC

## 2015-06-29 MED ORDER — ALBUTEROL SULFATE (2.5 MG/3ML) 0.083% IN NEBU: 2.5000 mg | INHALATION_SOLUTION | RESPIRATORY_TRACT | Status: DC | PRN

## 2015-06-29 MED ORDER — ALBUTEROL SULFATE HFA 108 (90 BASE) MCG/ACT IN AERS: 2.0000 | INHALATION_SPRAY | Freq: Four times a day (QID) | RESPIRATORY_TRACT | Status: DC | PRN

## 2015-06-29 MED ORDER — FLUTICASONE-SALMETEROL 100-50 MCG/DOSE IN AEPB: 1.0000 | INHALATION_SPRAY | Freq: Two times a day (BID) | RESPIRATORY_TRACT | Status: DC

## 2015-06-29 MED ORDER — IBUPROFEN 800 MG PO TABS: 800.0000 mg | ORAL_TABLET | Freq: Three times a day (TID) | ORAL | Status: DC | PRN

## 2015-06-29 MED ORDER — BUDESONIDE-FORMOTEROL FUMARATE 160-4.5 MCG/ACT IN AERO: 2.0000 | INHALATION_SPRAY | Freq: Two times a day (BID) | RESPIRATORY_TRACT | Status: DC

## 2015-06-29 NOTE — Patient Instructions (Signed)
We have given you the prescription for the shower chair.   We have also put in the referral for physical therapy to see if they can do water therapy.

## 2015-06-29 NOTE — Progress Notes (Signed)
   Subjective:    Patient ID: Jade Mathis, female    DOB: 1975/05/01, 40 y.o.   MRN: [id number]  HPI The patient is a 40 YO female coming in for follow up of her medical conditions including her asthma (doing well on advair and using albuterol rarely due to pollen right now, no flare, takes prednisone daily for her MG), her fatigue (still severe and she is worried about flare of her MG and is thinking about second opinion, labs normal recently, overall not exercising right now, willing to try PT water therapy), and her GERD (she does zantac and uses rare zofran for nausea, wants to be back on omeprazole which worked better in the past, she takes many meds which could bother her stomach which are necessary for her medical conditions). Needs a shower chair as some of her ADLs are hard to do.   Review of Systems  Constitutional: Positive for activity change and fatigue. Negative for fever, chills, appetite change and unexpected weight change.  HENT: Negative.   Eyes: Negative.   Respiratory: Negative for cough, chest tightness, shortness of breath and wheezing.   Cardiovascular: Negative for chest pain, palpitations and leg swelling.  Gastrointestinal: Positive for nausea and abdominal pain. Negative for vomiting, diarrhea, constipation and abdominal distention.  Musculoskeletal: Positive for myalgias. Negative for arthralgias.  Skin: Negative.   Neurological: Positive for weakness. Negative for dizziness, facial asymmetry, light-headedness, numbness and headaches.  Psychiatric/Behavioral: Negative.       Objective:   Physical Exam  Constitutional: She is oriented to person, place, and time. She appears well-developed and well-nourished.  Overweight  HENT:  Head: Normocephalic and atraumatic.  Eyes: EOM are normal.  Neck: Normal range of motion.  Cardiovascular: Normal rate and regular rhythm.   Pulmonary/Chest: Effort normal and breath sounds normal. No respiratory distress. She has no  wheezes. She has no rales.  Abdominal: Soft. Bowel sounds are normal. She exhibits no distension and no mass. There is tenderness. There is no rebound and no guarding.  Due to ventral hernia, no rebound or guarding.  Musculoskeletal: She exhibits no edema.  Neurological: She is alert and oriented to person, place, and time. Coordination normal.  Skin: Skin is warm and dry.  Psychiatric: She has a normal mood and affect.   Filed Vitals:   06/29/15 0833  BP: 110/82  Pulse: 80  Temp: 98.4 F (36.9 C)  TempSrc: Oral  Resp: 20  Height: 5\' 6"  (1.676 m)  Weight: 199 lb 6.4 oz (90.447 kg)  SpO2: 98%      Assessment & Plan:

## 2015-06-29 NOTE — Progress Notes (Signed)
Pre visit review using our clinic review tool, if applicable. No additional management support is needed unless otherwise documented below in the visit note. 

## 2015-06-30 NOTE — Assessment & Plan Note (Signed)
No flare today, taking symbicort and advair daily. The prednisone is for her MG but also likely keeping her from having flares. Need to find her last bone density as she will have secondary changes from the chronic steroids.

## 2015-06-30 NOTE — Assessment & Plan Note (Signed)
Metabolic workup did not reveal a cause. Think partly deconditioning and ordered PT water therapy for her. Talked to her about water aerobics and she feels unable to go due to the crowd. This is likely complicated and not from one etiology.

## 2015-06-30 NOTE — Assessment & Plan Note (Signed)
This is very poorly controlled and I have asked her to return to her psychiatrist. This is likely related to some of her symptoms and complicates all her disease processes.

## 2015-06-30 NOTE — Assessment & Plan Note (Signed)
Rx for omeprazole to add to her current zantac. She is on numerous medicines which can irritate the stomach lining.

## 2015-07-01 ENCOUNTER — Ambulatory Visit: Payer: Medicare Other | Admitting: Endocrinology

## 2015-07-01 ENCOUNTER — Other Ambulatory Visit (INDEPENDENT_AMBULATORY_CARE_PROVIDER_SITE_OTHER): Payer: Medicare Other

## 2015-07-01 DIAGNOSIS — E890 Postprocedural hypothyroidism: Secondary | ICD-10-CM | POA: Diagnosis not present

## 2015-07-01 DIAGNOSIS — E1165 Type 2 diabetes mellitus with hyperglycemia: Secondary | ICD-10-CM | POA: Diagnosis not present

## 2015-07-01 LAB — COMPREHENSIVE METABOLIC PANEL
ALT: 8 U/L (ref 0–35)
AST: 9 U/L (ref 0–37)
Albumin: 3.5 g/dL (ref 3.5–5.2)
Alkaline Phosphatase: 79 U/L (ref 39–117)
BUN: 13 mg/dL (ref 6–23)
CO2: 29 mEq/L (ref 19–32)
Calcium: 9 mg/dL (ref 8.4–10.5)
Chloride: 103 mEq/L (ref 96–112)
Creatinine, Ser: 0.76 mg/dL (ref 0.40–1.20)
GFR: 108.21 mL/min (ref >60.00)
Glucose, Bld: 151 mg/dL — ABNORMAL HIGH (ref 70–99)
Potassium: 3.4 mEq/L — ABNORMAL LOW (ref 3.5–5.1)
Sodium: 137 mEq/L (ref 135–145)
Total Bilirubin: 0.2 mg/dL (ref 0.2–1.2)
Total Protein: 6.7 g/dL (ref 6.0–8.3)

## 2015-07-01 LAB — LIPID PANEL
Cholesterol: 129 mg/dL (ref 0–200)
HDL: 30.6 mg/dL — ABNORMAL LOW (ref >39.00)
LDL Cholesterol: 84 mg/dL (ref 0–99)
NonHDL: 98.23
Total CHOL/HDL Ratio: 4
Triglycerides: 70 mg/dL (ref 0.0–149.0)
VLDL: 14 mg/dL (ref 0.0–40.0)

## 2015-07-01 LAB — TSH: TSH: 2.33 u[IU]/mL (ref 0.35–4.50)

## 2015-07-01 LAB — T4, FREE: Free T4: 0.98 ng/dL (ref 0.60–1.60)

## 2015-07-01 LAB — MICROALBUMIN / CREATININE URINE RATIO
Creatinine,U: 204.1 mg/dL
Microalb Creat Ratio: 0.3 mg/g (ref 0.0–30.0)
Microalb, Ur: 0.7 mg/dL (ref 0.0–1.9)

## 2015-07-01 LAB — HEMOGLOBIN A1C: Hgb A1c MFr Bld: 6.8 % — ABNORMAL HIGH (ref 4.6–6.5)

## 2015-07-05 ENCOUNTER — Ambulatory Visit: Payer: Medicare Other | Admitting: Endocrinology

## 2015-07-06 ENCOUNTER — Telehealth: Payer: Self-pay | Admitting: *Deleted

## 2015-07-06 ENCOUNTER — Other Ambulatory Visit: Payer: Self-pay | Admitting: *Deleted

## 2015-07-06 ENCOUNTER — Ambulatory Visit (INDEPENDENT_AMBULATORY_CARE_PROVIDER_SITE_OTHER): Payer: Medicare Other | Admitting: Endocrinology

## 2015-07-06 ENCOUNTER — Encounter: Payer: Self-pay | Admitting: Endocrinology

## 2015-07-06 VITALS — BP 138/94 | HR 93 | Temp 97.8°F | Resp 14 | Ht 66.0 in | Wt 195.8 lb

## 2015-07-06 DIAGNOSIS — E1142 Type 2 diabetes mellitus with diabetic polyneuropathy: Secondary | ICD-10-CM

## 2015-07-06 DIAGNOSIS — E876 Hypokalemia: Secondary | ICD-10-CM | POA: Diagnosis not present

## 2015-07-06 DIAGNOSIS — R112 Nausea with vomiting, unspecified: Secondary | ICD-10-CM

## 2015-07-06 DIAGNOSIS — E1165 Type 2 diabetes mellitus with hyperglycemia: Secondary | ICD-10-CM

## 2015-07-06 MED ORDER — GLIMEPIRIDE 2 MG PO TABS: 2.0000 mg | ORAL_TABLET | Freq: Two times a day (BID) | ORAL | Status: DC

## 2015-07-06 MED ORDER — METFORMIN HCL ER 500 MG PO TB24: 2000.0000 mg | ORAL_TABLET | Freq: Every day | ORAL | Status: DC

## 2015-07-06 MED ORDER — POTASSIUM CHLORIDE ER 10 MEQ PO TBCR: 10.0000 meq | EXTENDED_RELEASE_TABLET | Freq: Every day | ORAL | Status: DC

## 2015-07-06 MED ORDER — GLUCOSE BLOOD VI STRP: ORAL_STRIP | Status: DC

## 2015-07-06 MED ORDER — ACCU-CHEK AVIVA PLUS W/DEVICE KIT: PACK | Status: DC

## 2015-07-06 MED ORDER — ACCU-CHEK FASTCLIX LANCETS MISC: Status: DC

## 2015-07-06 MED ORDER — GLUCERNA SHAKE PO LIQD: 237.0000 mL | Freq: Three times a day (TID) | ORAL | Status: DC

## 2015-07-06 NOTE — Telephone Encounter (Signed)
Patient came in for her appointment today 11 minutes late, she had no showed her appointment the day before and cancelled the one before that. She was told she would have to wait at least 30-45 minutes because the other two patients had already arrived, she agreed and sat down.  When I called her back she demanded to know why she had to wait so long and I said it was because she was late, she answered very rudely that she was not late, and was very argumentative. She kept saying that she's watching all of [place] because she heard [place] talking about patients on the phone and then she hung up?!  She then told me I was being very rude to her and I asked why and she just said because I am and she's watching me.  When I tried to go over her medication list, she angrily said I'm not going over it, I have to every time I go to the Drs. And no one will take off the meds I'm not taking, I explained to her that we could not removed medication from her list that was prescribed by another physician, she then just told me to shut up and leave her alone.  I finished her vitals and other information and left.
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
## 2015-07-06 NOTE — Telephone Encounter (Signed)
Noted  

## 2015-07-07 ENCOUNTER — Encounter: Payer: Self-pay | Admitting: *Deleted

## 2015-07-08 ENCOUNTER — Ambulatory Visit: Payer: Medicare Other | Attending: Pain Medicine | Admitting: Physical Therapy

## 2015-07-08 DIAGNOSIS — R252 Cramp and spasm: Secondary | ICD-10-CM | POA: Insufficient documentation

## 2015-07-08 DIAGNOSIS — M6281 Muscle weakness (generalized): Secondary | ICD-10-CM | POA: Insufficient documentation

## 2015-07-08 DIAGNOSIS — G44229 Chronic tension-type headache, not intractable: Secondary | ICD-10-CM | POA: Insufficient documentation

## 2015-07-08 DIAGNOSIS — M542 Cervicalgia: Secondary | ICD-10-CM | POA: Insufficient documentation

## 2015-07-08 DIAGNOSIS — R293 Abnormal posture: Secondary | ICD-10-CM | POA: Insufficient documentation

## 2015-07-12 ENCOUNTER — Encounter: Payer: Medicare Other | Admitting: Physical Therapy

## 2015-07-14 ENCOUNTER — Encounter: Payer: Self-pay | Admitting: Physical Therapy

## 2015-07-14 ENCOUNTER — Ambulatory Visit: Payer: Medicare Other | Admitting: Physical Therapy

## 2015-07-14 ENCOUNTER — Encounter: Payer: Medicare Other | Admitting: Physical Therapy

## 2015-07-14 DIAGNOSIS — R293 Abnormal posture: Secondary | ICD-10-CM | POA: Diagnosis not present

## 2015-07-14 DIAGNOSIS — G44229 Chronic tension-type headache, not intractable: Secondary | ICD-10-CM

## 2015-07-14 DIAGNOSIS — M6281 Muscle weakness (generalized): Secondary | ICD-10-CM | POA: Diagnosis not present

## 2015-07-14 DIAGNOSIS — M542 Cervicalgia: Secondary | ICD-10-CM | POA: Diagnosis not present

## 2015-07-14 DIAGNOSIS — R252 Cramp and spasm: Secondary | ICD-10-CM

## 2015-07-15 ENCOUNTER — Ambulatory Visit: Payer: Medicare Other | Admitting: Physical Therapy
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
## 2015-07-16 ENCOUNTER — Telehealth: Payer: Self-pay | Admitting: *Deleted

## 2015-07-16 NOTE — Telephone Encounter (Signed)
Called and spoke with patient notifying her that Dr Dwyane Dee has responded to our request for diabetic shoe authorization stating that she does not meet Medicare's criteria for diabetic shoes therefore is not signing the authorization.  Patient states she does not understand I told her with out Dr Ronnie Derby signature agreeing with our findings she can not get diabetic shoes.  She will need to speak with Dr Dwyane Dee as to his response.

## 2015-07-19 ENCOUNTER — Encounter: Payer: Medicare Other | Admitting: Physical Therapy

## 2015-07-19 ENCOUNTER — Telehealth: Payer: Self-pay | Admitting: Endocrinology

## 2015-07-19 NOTE — Telephone Encounter (Signed)
Patient ask if you would give her a call. °

## 2015-07-19 NOTE — Telephone Encounter (Signed)
She thinks that she had shoes prescribed last year and she should get them again this year. Please have [facility] fax the forms and the diagnoses that we can use for the shoes, she does not want to talk to [facility] [place]

## 2015-07-19 NOTE — Telephone Encounter (Signed)
A message was left with them asking to fax you the needed forms and to see if she has had shoes made there before.

## 2015-07-19 NOTE — Telephone Encounter (Signed)
Please call pt as soon as possible

## 2015-07-21 ENCOUNTER — Encounter: Payer: Medicare Other | Admitting: Physical Therapy

## 2015-07-27 ENCOUNTER — Telehealth: Payer: Self-pay | Admitting: Physical Therapy

## 2015-07-27 NOTE — Telephone Encounter (Addendum)
Left massage on 5/3 at 1:19 pm regarding follow up for discussion held for suggested aquatic exercises and D/C from therapy.  Spoke with pt on 5/9 at 3:05 pm and discussed D/C from PT at this time and reminded to find a pool for aquatic exercises.

## 2015-07-29 ENCOUNTER — Ambulatory Visit: Payer: Medicare Other | Admitting: Family

## 2015-07-29 DIAGNOSIS — Z0289 Encounter for other administrative examinations: Secondary | ICD-10-CM

## 2015-07-30 ENCOUNTER — Encounter (HOSPITAL_COMMUNITY): Payer: Self-pay

## 2015-07-30 ENCOUNTER — Emergency Department (HOSPITAL_COMMUNITY)
Admission: EM | Admit: 2015-07-30 | Discharge: 2015-07-30 | Disposition: A | Discharge status: Home / Self Care | Payer: Medicare Other | Attending: Emergency Medicine | Admitting: Emergency Medicine

## 2015-07-30 DIAGNOSIS — G4733 Obstructive sleep apnea (adult) (pediatric): Secondary | ICD-10-CM | POA: Diagnosis not present

## 2015-07-30 DIAGNOSIS — I10 Essential (primary) hypertension: Secondary | ICD-10-CM | POA: Insufficient documentation

## 2015-07-30 DIAGNOSIS — Z7951 Long term (current) use of inhaled steroids: Secondary | ICD-10-CM | POA: Insufficient documentation

## 2015-07-30 DIAGNOSIS — Z8619 Personal history of other infectious and parasitic diseases: Secondary | ICD-10-CM | POA: Insufficient documentation

## 2015-07-30 DIAGNOSIS — G7000 Myasthenia gravis without (acute) exacerbation: Secondary | ICD-10-CM | POA: Insufficient documentation

## 2015-07-30 DIAGNOSIS — F22 Delusional disorders: Secondary | ICD-10-CM | POA: Insufficient documentation

## 2015-07-30 DIAGNOSIS — Z8742 Personal history of other diseases of the female genital tract: Secondary | ICD-10-CM | POA: Diagnosis not present

## 2015-07-30 DIAGNOSIS — Z9981 Dependence on supplemental oxygen: Secondary | ICD-10-CM | POA: Insufficient documentation

## 2015-07-30 DIAGNOSIS — J45909 Unspecified asthma, uncomplicated: Secondary | ICD-10-CM | POA: Diagnosis not present

## 2015-07-30 DIAGNOSIS — Z7984 Long term (current) use of oral hypoglycemic drugs: Secondary | ICD-10-CM | POA: Diagnosis not present

## 2015-07-30 DIAGNOSIS — E0500 Thyrotoxicosis with diffuse goiter without thyrotoxic crisis or storm: Secondary | ICD-10-CM | POA: Insufficient documentation

## 2015-07-30 DIAGNOSIS — F600 Paranoid personality disorder: Secondary | ICD-10-CM | POA: Diagnosis not present

## 2015-07-30 DIAGNOSIS — E119 Type 2 diabetes mellitus without complications: Secondary | ICD-10-CM | POA: Insufficient documentation

## 2015-07-30 DIAGNOSIS — Z79899 Other long term (current) drug therapy: Secondary | ICD-10-CM | POA: Diagnosis not present

## 2015-07-30 DIAGNOSIS — F319 Bipolar disorder, unspecified: Secondary | ICD-10-CM | POA: Diagnosis not present

## 2015-07-30 DIAGNOSIS — M549 Dorsalgia, unspecified: Secondary | ICD-10-CM | POA: Diagnosis present

## 2015-07-30 LAB — COMPREHENSIVE METABOLIC PANEL
ALT: 10 U/L — ABNORMAL LOW (ref 14–54)
AST: 13 U/L — ABNORMAL LOW (ref 15–41)
Albumin: 3.3 g/dL — ABNORMAL LOW (ref 3.5–5.0)
Alkaline Phosphatase: 73 U/L (ref 38–126)
Anion gap: 9 (ref 5–15)
BUN: 14 mg/dL (ref 6–20)
CO2: 28 mmol/L (ref 22–32)
Calcium: 9 mg/dL (ref 8.9–10.3)
Chloride: 104 mmol/L (ref 101–111)
Creatinine, Ser: 0.66 mg/dL (ref 0.44–1.00)
GFR calc Af Amer: >60 mL/min (ref >60)
GFR calc non Af Amer: >60 mL/min (ref >60)
Glucose, Bld: 193 mg/dL — ABNORMAL HIGH (ref 65–99)
Potassium: 3.4 mmol/L — ABNORMAL LOW (ref 3.5–5.1)
Sodium: 141 mmol/L (ref 135–145)
Total Bilirubin: 0.2 mg/dL — ABNORMAL LOW (ref 0.3–1.2)
Total Protein: 6.6 g/dL (ref 6.5–8.1)

## 2015-07-30 LAB — URINALYSIS, ROUTINE W REFLEX MICROSCOPIC
Bilirubin Urine: NEGATIVE
Glucose, UA: 500 mg/dL — AB
Hgb urine dipstick: NEGATIVE
Ketones, ur: NEGATIVE mg/dL
Leukocytes, UA: NEGATIVE
Nitrite: NEGATIVE
Protein, ur: NEGATIVE mg/dL
Specific Gravity, Urine: 1.035 — ABNORMAL HIGH (ref 1.005–1.030)
pH: 5.5 (ref 5.0–8.0)

## 2015-07-30 LAB — CBC
HCT: 38.7 % (ref 36.0–46.0)
Hemoglobin: 12.3 g/dL (ref 12.0–15.0)
MCH: 25.8 pg — ABNORMAL LOW (ref 26.0–34.0)
MCHC: 31.8 g/dL (ref 30.0–36.0)
MCV: 81.3 fL (ref 78.0–100.0)
Platelets: 294 10*3/uL (ref 150–400)
RBC: 4.76 MIL/uL (ref 3.87–5.11)
RDW: 13 % (ref 11.5–15.5)
WBC: 7.4 10*3/uL (ref 4.0–10.5)

## 2015-07-30 LAB — LIPASE, BLOOD: Lipase: 29 U/L (ref 11–51)

## 2015-07-30 NOTE — ED Notes (Signed)
Pt complaining of lower abdominal pain. States some vaginal discharge. Started ~ 1 week ago.

## 2015-07-30 NOTE — ED Notes (Addendum)
This RN went to the waiting room to get the
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
## 2015-08-01 ENCOUNTER — Inpatient Hospital Stay (HOSPITAL_COMMUNITY)
Admission: AD | Admit: 2015-08-01 | Discharge: 2015-08-01 | Disposition: A | Discharge status: Home / Self Care | Payer: Medicare Other | Source: Ambulatory Visit | Attending: Obstetrics & Gynecology | Admitting: Obstetrics & Gynecology

## 2015-08-01 DIAGNOSIS — N898 Other specified noninflammatory disorders of vagina: Secondary | ICD-10-CM | POA: Insufficient documentation

## 2015-08-01 DIAGNOSIS — Z5321 Procedure and treatment not carried out due to patient leaving prior to being seen by health care provider: Secondary | ICD-10-CM | POA: Insufficient documentation

## 2015-08-01 LAB — URINALYSIS, ROUTINE W REFLEX MICROSCOPIC
Bilirubin Urine: NEGATIVE
Glucose, UA: NEGATIVE mg/dL
Hgb urine dipstick: NEGATIVE
Ketones, ur: 15 mg/dL — AB
Leukocytes, UA: NEGATIVE
Nitrite: NEGATIVE
Protein, ur: NEGATIVE mg/dL
Specific Gravity, Urine: >1.03 — ABNORMAL HIGH (ref 1.005–1.030)
pH: 6 (ref 5.0–8.0)

## 2015-08-01 LAB — POCT PREGNANCY, URINE: Preg Test, Ur: NEGATIVE

## 2015-08-01 NOTE — MAU Note (Signed)
Not in lobby x1  
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
## 2015-08-01 NOTE — MAU Note (Signed)
Not in lobby x2.

## 2015-08-01 NOTE — MAU Note (Signed)
Not in lobby x 3  

## 2015-08-03 DIAGNOSIS — M5412 Radiculopathy, cervical region: Secondary | ICD-10-CM | POA: Diagnosis not present

## 2015-08-03 DIAGNOSIS — Z79899 Other long term (current) drug therapy: Secondary | ICD-10-CM | POA: Diagnosis not present

## 2015-08-03 DIAGNOSIS — G43909 Migraine, unspecified, not intractable, without status migrainosus: Secondary | ICD-10-CM | POA: Diagnosis not present

## 2015-08-03 DIAGNOSIS — M542 Cervicalgia: Secondary | ICD-10-CM | POA: Diagnosis not present

## 2015-08-04 ENCOUNTER — Telehealth: Payer: Self-pay | Admitting: Internal Medicine

## 2015-08-04 NOTE — Telephone Encounter (Signed)
Patient no showed on 5/11 for foot exam with Marya Amsler.  Looks like patient was admitted to hospital on 5/12.  Please advise.

## 2015-08-06 NOTE — Telephone Encounter (Signed)
Left message for patient to call back to schedule.  °

## 2015-08-06 NOTE — Telephone Encounter (Signed)
If in hospital needs follow up for that.

## 2015-08-09 ENCOUNTER — Other Ambulatory Visit: Payer: Self-pay | Admitting: Endocrinology

## 2015-08-10 ENCOUNTER — Telehealth: Payer: Self-pay | Admitting: Endocrinology

## 2015-08-10 ENCOUNTER — Other Ambulatory Visit: Payer: Self-pay

## 2015-08-10 MED ORDER — GLIMEPIRIDE 2 MG PO TABS: 2.0000 mg | ORAL_TABLET | Freq: Two times a day (BID) | ORAL | Status: DC

## 2015-08-10 NOTE — Telephone Encounter (Signed)
Ok to use 2mg  bid

## 2015-08-10 NOTE — Telephone Encounter (Signed)
Patient called, she said she had been on the amaryl 2 mg, 1 bid, but on the 22nd you sent in amaryl 1 mg bid, she wants to know what dose she is suppose to take? She is also asking about the forms for her shoes.  Please advise.

## 2015-08-10 NOTE — Telephone Encounter (Signed)
Pt requesting call back

## 2015-08-11 ENCOUNTER — Telehealth: Payer: Self-pay | Admitting: Endocrinology

## 2015-08-11 NOTE — Telephone Encounter (Signed)
They are written on the prescription bottles

## 2015-08-11 NOTE — Telephone Encounter (Signed)
Pt needs [place] to call to speak with her husband to give him clarity on how to give her the meds

## 2015-08-11 NOTE — Telephone Encounter (Signed)
I spoke with the patient and her husband, she was confused about her metformin and Amaryl, I had discussed it yesterday with her, but told her husband how she was suppose to take it today.

## 2015-08-11 NOTE — Telephone Encounter (Signed)
Rx sent 

## 2015-08-12 ENCOUNTER — Encounter: Payer: Self-pay | Admitting: Neurology

## 2015-08-12 ENCOUNTER — Ambulatory Visit (INDEPENDENT_AMBULATORY_CARE_PROVIDER_SITE_OTHER): Payer: Medicare Other | Admitting: Neurology

## 2015-08-12 VITALS — BP 110/80 | HR 90 | Ht 66.0 in | Wt 191.1 lb

## 2015-08-12 DIAGNOSIS — G7001 Myasthenia gravis with (acute) exacerbation: Secondary | ICD-10-CM | POA: Diagnosis not present

## 2015-08-12 MED ORDER — PREDNISONE 10 MG PO TABS: 10.0000 mg | ORAL_TABLET | Freq: Every day | ORAL | Status: DC

## 2015-08-12 NOTE — Patient Instructions (Addendum)
You are looking great today! Continue Imuran 150mg , prednisone 10mg , and mestinon 60mg  three times daily Stay cool during the summer months  Return to clinic in 4 months
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
## 2015-08-20 ENCOUNTER — Telehealth: Payer: Self-pay | Admitting: *Deleted

## 2015-08-20 ENCOUNTER — Telehealth: Payer: Self-pay | Admitting: Internal Medicine

## 2015-08-20 ENCOUNTER — Ambulatory Visit: Payer: Medicare Other | Admitting: Internal Medicine

## 2015-08-20 DIAGNOSIS — F411 Generalized anxiety disorder: Secondary | ICD-10-CM

## 2015-08-20 NOTE — Telephone Encounter (Signed)
Patient is requesting to be referred for memory loss.  Please follow up in regard.

## 2015-08-20 NOTE — Telephone Encounter (Signed)
Spouse states she is having a lot of anxiety and confussion as well.  Patient states she is good today because of a medication change.  Patient did say she does have to worry about the saftey of her two kids.  Patient also states she is concerned because of her medical conditions in regards to medications.  She wants to be placed with a specialist who can address all issues.  Patient states she had appointment scheduled for today but was rescheduled because she was 12 minutes late.
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
## 2015-08-20 NOTE — Telephone Encounter (Signed)
Called patient back in regard.  States she is concerned about her kids care and understanding when she is having an issue.  Patient states she is not sure what kind of issue she is having.  She states is not anxiety now and she does not get mad or upset.  Patient states she no longer is going to the psychiatrist she was seeing.  She is requesting a referral for a new one.  She also states she is not going to go to [place].  Patient spouse got on the phone and stated that patient is having anxiety and she is acting like a child.  He would like to know if this is due to medication she might be taking.  Patient will keep appointment with Sharlet Salina.

## 2015-08-20 NOTE — Telephone Encounter (Signed)
Patient called stating that she is having some memory loss and that she does not remember coming in to see [place] last week.  She is also having a squeezing feeling in her head.  I instructed her to call her PCP to make sure nothing else is going on like an infection and to keep her June 7 appointment per Dr. Posey Pronto.  Also instructed her to go to ER if she gets worse.  Patient agreed with plan.

## 2015-08-23 NOTE — Addendum Note (Signed)
Addended by: Pricilla Holm A on: 08/23/2015 09:01 AM   Modules accepted: Orders

## 2015-08-23 NOTE — Telephone Encounter (Signed)
Referral placed to psych

## 2015-08-24 ENCOUNTER — Ambulatory Visit: Payer: Self-pay | Admitting: Surgery

## 2015-08-24 DIAGNOSIS — K432 Incisional hernia without obstruction or gangrene: Secondary | ICD-10-CM | POA: Diagnosis not present

## 2015-08-24 DIAGNOSIS — Z8719 Personal history of other diseases of the digestive system: Secondary | ICD-10-CM | POA: Diagnosis not present

## 2015-08-24 DIAGNOSIS — R1033 Periumbilical pain: Secondary | ICD-10-CM | POA: Diagnosis not present

## 2015-08-25 ENCOUNTER — Ambulatory Visit (INDEPENDENT_AMBULATORY_CARE_PROVIDER_SITE_OTHER): Payer: Medicare Other | Admitting: Neurology

## 2015-08-25 ENCOUNTER — Encounter: Payer: Self-pay | Admitting: Neurology

## 2015-08-25 VITALS — BP 110/78 | HR 80 | Ht 66.0 in | Wt 190.3 lb

## 2015-08-25 DIAGNOSIS — F05 Delirium due to known physiological condition: Secondary | ICD-10-CM | POA: Diagnosis not present

## 2015-08-25 DIAGNOSIS — F3110 Bipolar disorder, current episode manic without psychotic features, unspecified: Secondary | ICD-10-CM

## 2015-08-25 DIAGNOSIS — R410 Disorientation, unspecified: Secondary | ICD-10-CM

## 2015-08-25 DIAGNOSIS — G44209 Tension-type headache, unspecified, not intractable: Secondary | ICD-10-CM | POA: Diagnosis not present

## 2015-08-25 MED ORDER — CYCLOBENZAPRINE HCL 5 MG PO TABS
5.0000 mg | ORAL_TABLET | Freq: Every evening | ORAL | Status: DC | PRN
Start: 2015-08-25 — End: 2015-10-21

## 2015-08-25 NOTE — Patient Instructions (Signed)
1.  Start flexeril 5mg  at bedtime for headaches 2.  Follow-up with psychiatry for your recent episode of mania and depression
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
## 2015-08-26 ENCOUNTER — Telehealth: Payer: Self-pay | Admitting: *Deleted
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
## 2015-08-27 NOTE — Telephone Encounter (Signed)
Dr. Posey Pronto and I thought it would be best to have someone check on this patient and her family.  I called the police and they said they would go by and try to make contact with this patient and check on her and her family today.

## 2015-08-30 ENCOUNTER — Other Ambulatory Visit: Payer: Self-pay | Admitting: Internal Medicine

## 2015-08-30 ENCOUNTER — Encounter: Payer: Self-pay | Admitting: Internal Medicine

## 2015-08-30 ENCOUNTER — Ambulatory Visit (INDEPENDENT_AMBULATORY_CARE_PROVIDER_SITE_OTHER): Payer: Medicare Other | Admitting: Internal Medicine

## 2015-08-30 VITALS — BP 132/78 | HR 101 | Temp 99.0°F | Resp 16 | Ht 66.0 in | Wt 196.1 lb

## 2015-08-30 DIAGNOSIS — G894 Chronic pain syndrome: Secondary | ICD-10-CM | POA: Diagnosis not present

## 2015-08-30 DIAGNOSIS — F331 Major depressive disorder, recurrent, moderate: Secondary | ICD-10-CM | POA: Diagnosis not present

## 2015-08-30 MED ORDER — OLANZAPINE 5 MG PO TABS: 5.0000 mg | ORAL_TABLET | Freq: Every day | ORAL | Status: DC

## 2015-08-30 NOTE — Progress Notes (Signed)
Pre visit review using our clinic review tool, if applicable. No additional management support is needed unless otherwise documented below in the visit note. 

## 2015-08-30 NOTE — Patient Instructions (Signed)
We will let you know about the referral to see when that will be done.  We have given you a prescription today for zyprexa which is a medicine that can be used for ptsd. It is 1 pill daily at bedtime. This is a medicine that only psychiatrists prescribe typically so we will not be able to prescribe this long term even if it works well.   We cannot take out the IUD here unfortunately.

## 2015-08-31 DIAGNOSIS — Z30432 Encounter for removal of intrauterine contraceptive device: Secondary | ICD-10-CM | POA: Diagnosis not present

## 2015-08-31 DIAGNOSIS — A609 Anogenital herpesviral infection, unspecified: Secondary | ICD-10-CM | POA: Diagnosis not present

## 2015-08-31 DIAGNOSIS — Z3040 Encounter for surveillance of contraceptives, unspecified: Secondary | ICD-10-CM | POA: Diagnosis not present

## 2015-09-02 DIAGNOSIS — M542 Cervicalgia: Secondary | ICD-10-CM | POA: Diagnosis not present

## 2015-09-02 DIAGNOSIS — Z79899 Other long term (current) drug therapy: Secondary | ICD-10-CM | POA: Diagnosis not present

## 2015-09-02 DIAGNOSIS — M5412 Radiculopathy, cervical region: Secondary | ICD-10-CM | POA: Diagnosis not present

## 2015-09-03 NOTE — Assessment & Plan Note (Signed)
She has been on many medications and has been on anti-psychotics in the past. She does not have only depression, I suspect some underlying mental health condition. I will rx zyprexa to see if this helps with her symptoms until she can get in to mental health but she knows that I will not be able to prescribe this medication or her other mental health medications long term as I am not an expert and I think she needs an expert to get her on the right medications to help her get back to herself.

## 2015-09-03 NOTE — Progress Notes (Signed)
   Subjective:    Patient ID: Jade Mathis, female    DOB: 05/26/1975, 40 y.o.   MRN: [id number]  HPI The patient is a 40 YO female coming in for follow up of several concerns including: pelvic pain (she thinks is related to her IUD, talked to her gynecologist and they told her to ask if we could remove her IUD as they were unable to see her, moderate and constant), and her memory problems (she thinks they are fine, she is not sure this is a problem) and her mental health (she has seen psych in the past and has been tried on many medications without adequate relief, was seeing psych and she is not sure why she is not going to them anymore but does not want to go back, has been acting out at home and thinks that the ER is cleaning her blood and does not trust them to test her blood). Her husband is with her and states that she is doing better today and this is better than she has been in some time. He does not worry for her safety or the safety of her children.   Review of Systems  Constitutional: Positive for activity change and fatigue. Negative for fever, chills, appetite change and unexpected weight change.  HENT: Negative.   Eyes: Negative.   Respiratory: Negative for cough, chest tightness, shortness of breath and wheezing.   Cardiovascular: Negative for chest pain, palpitations and leg swelling.  Gastrointestinal: Positive for nausea and abdominal pain. Negative for vomiting, diarrhea, constipation and abdominal distention.  Musculoskeletal: Positive for myalgias. Negative for arthralgias.  Skin: Negative.   Neurological: Positive for weakness. Negative for dizziness, facial asymmetry, light-headedness, numbness and headaches.  Psychiatric/Behavioral: Positive for agitation.       Delusional thoughts      Objective:   Physical Exam  Constitutional: She is oriented to person, place, and time. She appears well-developed and well-nourished.  Overweight  HENT:  Head: Normocephalic and  atraumatic.  Eyes: EOM are normal.  Neck: Normal range of motion.  Cardiovascular: Normal rate and regular rhythm.   Pulmonary/Chest: Effort normal and breath sounds normal. No respiratory distress. She has no wheezes. She has no rales.  Abdominal: Soft. Bowel sounds are normal. She exhibits no distension and no mass. There is tenderness. There is no rebound and no guarding.  Pain low abdomen bilaterally.   Musculoskeletal: She exhibits no edema.  Neurological: She is alert and oriented to person, place, and time. Coordination normal.  Skin: Skin is warm and dry.  Psychiatric:  She is coherent during the visit but expresses some delusional thoughts.    Filed Vitals:   08/30/15 1542  BP: 132/78  Pulse: 101  Temp: 99 F (37.2 C)  TempSrc: Oral  Resp: 16  Height: 5\' 6"  (1.676 m)  Weight: 196 lb 1.9 oz (88.959 kg)  SpO2: 98%      Assessment & Plan:

## 2015-09-03 NOTE — Assessment & Plan Note (Signed)
We are not able to remove her IUD here in the office but encouraged her to make an appointment with her Ob/Gyn and if not able to be seen to go to [facility] hospital for removal.

## 2015-09-13 ENCOUNTER — Other Ambulatory Visit: Payer: Self-pay | Admitting: Endocrinology

## 2015-09-13 ENCOUNTER — Encounter: Payer: Self-pay | Admitting: Endocrinology

## 2015-09-13 ENCOUNTER — Ambulatory Visit (INDEPENDENT_AMBULATORY_CARE_PROVIDER_SITE_OTHER): Payer: Medicare Other | Admitting: Endocrinology

## 2015-09-13 ENCOUNTER — Telehealth: Payer: Self-pay | Admitting: Endocrinology

## 2015-09-13 VITALS — BP 132/84 | HR 103 | Ht 66.0 in | Wt 205.0 lb

## 2015-09-13 DIAGNOSIS — E1165 Type 2 diabetes mellitus with hyperglycemia: Secondary | ICD-10-CM

## 2015-09-13 MED ORDER — SITAGLIPTIN PHOSPHATE 100 MG PO TABS: 100.0000 mg | ORAL_TABLET | Freq: Every day | ORAL | Status: DC

## 2015-09-13 NOTE — Patient Instructions (Addendum)
Start Januvia  See Dr Adele Schilder

## 2015-09-13 NOTE — Telephone Encounter (Signed)
Pt has been having highs in the AM before breakfast she has been ranging 160-over 200
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
## 2015-09-14 DIAGNOSIS — M6281 Muscle weakness (generalized): Secondary | ICD-10-CM | POA: Diagnosis not present

## 2015-09-14 DIAGNOSIS — N921 Excessive and frequent menstruation with irregular cycle: Secondary | ICD-10-CM | POA: Diagnosis not present

## 2015-09-14 DIAGNOSIS — M25519 Pain in unspecified shoulder: Secondary | ICD-10-CM | POA: Diagnosis not present

## 2015-09-14 DIAGNOSIS — R109 Unspecified abdominal pain: Secondary | ICD-10-CM | POA: Diagnosis not present

## 2015-09-14 DIAGNOSIS — M545 Low back pain: Secondary | ICD-10-CM | POA: Diagnosis not present

## 2015-09-15 DIAGNOSIS — Z113 Encounter for screening for infections with a predominantly sexual mode of transmission: Secondary | ICD-10-CM | POA: Diagnosis not present

## 2015-09-15 DIAGNOSIS — R109 Unspecified abdominal pain: Secondary | ICD-10-CM | POA: Diagnosis not present

## 2015-09-15 NOTE — Telephone Encounter (Addendum)
Message addressed during her office viist on 09/13/2015.

## 2015-09-17 DIAGNOSIS — M545 Low back pain: Secondary | ICD-10-CM | POA: Diagnosis not present

## 2015-09-17 DIAGNOSIS — M6281 Muscle weakness (generalized): Secondary | ICD-10-CM | POA: Diagnosis not present

## 2015-09-17 DIAGNOSIS — M25519 Pain in unspecified shoulder: Secondary | ICD-10-CM | POA: Diagnosis not present

## 2015-09-20 DIAGNOSIS — R635 Abnormal weight gain: Secondary | ICD-10-CM | POA: Diagnosis not present

## 2015-09-20 DIAGNOSIS — Z79899 Other long term (current) drug therapy: Secondary | ICD-10-CM | POA: Diagnosis not present

## 2015-09-26 ENCOUNTER — Other Ambulatory Visit: Payer: Self-pay | Admitting: Internal Medicine

## 2015-09-30 DIAGNOSIS — Z79899 Other long term (current) drug therapy: Secondary | ICD-10-CM | POA: Diagnosis not present

## 2015-09-30 DIAGNOSIS — R635 Abnormal weight gain: Secondary | ICD-10-CM | POA: Diagnosis not present

## 2015-09-30 DIAGNOSIS — E7800 Pure hypercholesterolemia, unspecified: Secondary | ICD-10-CM | POA: Diagnosis not present

## 2015-09-30 DIAGNOSIS — R5383 Other fatigue: Secondary | ICD-10-CM | POA: Diagnosis not present

## 2015-09-30 DIAGNOSIS — R0602 Shortness of breath: Secondary | ICD-10-CM | POA: Diagnosis not present

## 2015-09-30 DIAGNOSIS — E559 Vitamin D deficiency, unspecified: Secondary | ICD-10-CM | POA: Diagnosis not present

## 2015-09-30 DIAGNOSIS — E119 Type 2 diabetes mellitus without complications: Secondary | ICD-10-CM | POA: Diagnosis not present

## 2015-10-02 DIAGNOSIS — M542 Cervicalgia: Secondary | ICD-10-CM | POA: Diagnosis not present

## 2015-10-02 DIAGNOSIS — M5412 Radiculopathy, cervical region: Secondary | ICD-10-CM | POA: Diagnosis not present

## 2015-10-02 DIAGNOSIS — Z79899 Other long term (current) drug therapy: Secondary | ICD-10-CM | POA: Diagnosis not present

## 2015-10-02 DIAGNOSIS — R252 Cramp and spasm: Secondary | ICD-10-CM | POA: Diagnosis not present

## 2015-10-04 ENCOUNTER — Other Ambulatory Visit (INDEPENDENT_AMBULATORY_CARE_PROVIDER_SITE_OTHER): Payer: Medicare Other

## 2015-10-04 DIAGNOSIS — E876 Hypokalemia: Secondary | ICD-10-CM

## 2015-10-04 DIAGNOSIS — E1165 Type 2 diabetes mellitus with hyperglycemia: Secondary | ICD-10-CM | POA: Diagnosis not present

## 2015-10-04 LAB — COMPREHENSIVE METABOLIC PANEL
ALT: 30 U/L (ref 0–35)
AST: 23 U/L (ref 0–37)
Albumin: 3.7 g/dL (ref 3.5–5.2)
Alkaline Phosphatase: 70 U/L (ref 39–117)
BUN: 14 mg/dL (ref 6–23)
CO2: 29 mEq/L (ref 19–32)
Calcium: 9 mg/dL (ref 8.4–10.5)
Chloride: 100 mEq/L (ref 96–112)
Creatinine, Ser: 0.57 mg/dL (ref 0.40–1.20)
GFR: 150.61 mL/min (ref >60.00)
Glucose, Bld: 143 mg/dL — ABNORMAL HIGH (ref 70–99)
Potassium: 3.6 mEq/L (ref 3.5–5.1)
Sodium: 133 mEq/L — ABNORMAL LOW (ref 135–145)
Total Bilirubin: 0.3 mg/dL (ref 0.2–1.2)
Total Protein: 7 g/dL (ref 6.0–8.3)

## 2015-10-04 LAB — MAGNESIUM: Magnesium: 1.9 mg/dL (ref 1.5–2.5)

## 2015-10-04 LAB — HEMOGLOBIN A1C: Hgb A1c MFr Bld: 6.6 % — ABNORMAL HIGH (ref 4.6–6.5)

## 2015-10-07 ENCOUNTER — Ambulatory Visit (INDEPENDENT_AMBULATORY_CARE_PROVIDER_SITE_OTHER): Payer: Medicare Other | Admitting: Endocrinology

## 2015-10-07 ENCOUNTER — Encounter: Payer: Self-pay | Admitting: Endocrinology

## 2015-10-07 VITALS — BP 106/62 | HR 104 | Ht 66.0 in | Wt 217.0 lb

## 2015-10-07 DIAGNOSIS — E890 Postprocedural hypothyroidism: Secondary | ICD-10-CM | POA: Diagnosis not present

## 2015-10-07 DIAGNOSIS — E1165 Type 2 diabetes mellitus with hyperglycemia: Secondary | ICD-10-CM | POA: Diagnosis not present

## 2015-10-07 MED ORDER — EXENATIDE ER 2 MG ~~LOC~~ PEN: PEN_INJECTOR | SUBCUTANEOUS | Status: DC
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
## 2015-10-07 NOTE — Patient Instructions (Signed)
Check blood sugars on waking up 4-5  times a week Also check blood sugars about 2 hours after a meal and do this after different meals by rotation  Recommended blood sugar levels on waking up is 90-130 and about 2 hours after meal is 130-160  Please bring your blood sugar monitor to each visit, thank you  When start Bydureon stop Januvia after 1 week

## 2015-10-09 ENCOUNTER — Other Ambulatory Visit: Payer: Self-pay | Admitting: Internal Medicine

## 2015-10-11 ENCOUNTER — Telehealth: Payer: Self-pay | Admitting: Endocrinology

## 2015-10-11 NOTE — Telephone Encounter (Signed)
Can we get a referral for Diabetic counseling per patient? Please advise.

## 2015-10-11 NOTE — Telephone Encounter (Signed)
Is this for diet?

## 2015-10-11 NOTE — Telephone Encounter (Signed)
Patient ask if you would give her a call.j

## 2015-10-11 NOTE — Telephone Encounter (Signed)
Patient called again and said she needs a referral to Diabetic Counseling upstairs in [address].

## 2015-10-12 ENCOUNTER — Telehealth: Payer: Self-pay | Admitting: Endocrinology

## 2015-10-12 NOTE — Telephone Encounter (Signed)
Tried to call patient to ask about referral for Diabetic counseling, no answer; left patient voicemail to call back to discuss further.

## 2015-10-12 NOTE — Telephone Encounter (Signed)
About diet and insulin instruction please wants to be educated on these things

## 2015-10-12 NOTE — Telephone Encounter (Signed)
Pt calling again for assistance with the referral to 4th floor [facility] please

## 2015-10-14 NOTE — Telephone Encounter (Signed)
PT called asking about her Diabetes/Nutrition referral.  Also needs to know if she needs to do this new injection at the same time every week when she uses it. Please advise.

## 2015-10-15 ENCOUNTER — Other Ambulatory Visit: Payer: Self-pay | Admitting: Neurology

## 2015-10-15 ENCOUNTER — Other Ambulatory Visit: Payer: Self-pay | Admitting: Endocrinology

## 2015-10-15 DIAGNOSIS — G43709 Chronic migraine without aura, not intractable, without status migrainosus: Secondary | ICD-10-CM | POA: Diagnosis not present

## 2015-10-15 DIAGNOSIS — G7000 Myasthenia gravis without (acute) exacerbation: Secondary | ICD-10-CM | POA: Diagnosis not present

## 2015-10-15 DIAGNOSIS — E1165 Type 2 diabetes mellitus with hyperglycemia: Secondary | ICD-10-CM

## 2015-10-15 DIAGNOSIS — G7001 Myasthenia gravis with (acute) exacerbation: Secondary | ICD-10-CM | POA: Diagnosis not present

## 2015-10-15 NOTE — Telephone Encounter (Signed)
Order has been placed.

## 2015-10-15 NOTE — Telephone Encounter (Signed)
Rx sent 

## 2015-10-15 NOTE — Telephone Encounter (Signed)
Pt is asking for her referral to please be put in so she can se Magda Paganini on the 4th floor [facility]

## 2015-10-18 DIAGNOSIS — M79601 Pain in right arm: Secondary | ICD-10-CM | POA: Diagnosis not present

## 2015-10-18 DIAGNOSIS — M25521 Pain in right elbow: Secondary | ICD-10-CM | POA: Diagnosis not present

## 2015-10-18 DIAGNOSIS — M25531 Pain in right wrist: Secondary | ICD-10-CM | POA: Diagnosis not present

## 2015-10-19 ENCOUNTER — Ambulatory Visit: Payer: Medicare Other | Admitting: *Deleted

## 2015-10-19 DIAGNOSIS — E0842 Diabetes mellitus due to underlying condition with diabetic polyneuropathy: Secondary | ICD-10-CM

## 2015-10-19 NOTE — Progress Notes (Signed)
Patient presents to be scanned and measured for diabetic shoes and inserts.  

## 2015-10-20 NOTE — Progress Notes (Signed)
Bmp 7/17, hgba1c 7/17 epic

## 2015-10-20 NOTE — Patient Instructions (Addendum)
Soffia Maya  10/20/2015   Your procedure is scheduled on: 10/26/15  Report to [facility] Main  Entrance take [facility]  elevators to 3rd floor to  [place] at  [place] AM.  Call this number if you have problems the morning of surgery [phone]   Remember: ONLY 1 PERSON MAY GO WITH YOU TO SHORT STAY TO GET  READY MORNING OF [place].  Do not eat food or drink liquids :After Midnight.     Take these medicines the morning of surgery with A SIP OF WATER: wellbutrin, gabapentin, levothyroxine, omeprazole, zantac, Junel Prednisone, imuran, pyridostamine  Symbicort, advair May take imitrez,/ maxalt,/ zofran,/oxycodone/ if needed--  Albuterol, flonase DO NOT TAKE ANY DIABETIC MEDICATIONS DAY OF YOUR SURGERY                               You may not have any metal on your body including hair pins and              piercings  Do not wear jewelry, make-up, lotions, powders or perfumes, deodorant             Do not wear nail polish.  Do not shave  48 hours prior to surgery.              Men may shave face and neck.   Do not bring valuables to the hospital. [place].  Contacts, dentures or bridgework may not be worn into surgery.  Leave suitcase in the car. After surgery it may be brought to your room.            [place] - Preparing for Surgery Before surgery, you can play an important role.  Because skin is not sterile, your skin needs to be as free of germs as possible.  You can reduce the number of germs on your skin by washing with CHG (chlorahexidine gluconate) soap before surgery.  CHG is an antiseptic cleaner which kills germs and bonds with the skin to continue killing germs even after washing. Please DO NOT use if you have an allergy to CHG or antibacterial soaps.  If your skin becomes reddened/irritated stop using the CHG and inform your nurse when you arrive at Short Stay. Do not shave (including legs  and underarms) for at least 48 hours prior to the first CHG shower.  You may shave your face/neck. Please follow these instructions carefully:  1.  Shower with CHG Soap the night before surgery and the  morning of Surgery.  2.  If you choose to wash your hair, wash your hair first as usual with your  normal  shampoo.  3.  After you shampoo, rinse your hair and body thoroughly to remove the  shampoo.                           4.  Use CHG as you would any other liquid soap.  You can apply chg directly  to the skin and wash                       Gently with a scrungie or clean washcloth.  5.  Apply the CHG  Soap to your body ONLY FROM THE NECK DOWN.   Do not use on face/ open                           Wound or open sores. Avoid contact with eyes, ears mouth and genitals (private parts).                       Wash face,  Genitals (private parts) with your normal soap.             6.  Wash thoroughly, paying special attention to the area where your surgery  will be performed.  7.  Thoroughly rinse your body with warm water from the neck down.  8.  DO NOT shower/wash with your normal soap after using and rinsing off  the CHG Soap.                9.  Pat yourself dry with a clean towel.            10.  Wear clean pajamas.            11.  Place clean sheets on your bed the night of your first shower and do not  sleep with pets. Day of Surgery : Do not apply any lotions/deodorants the morning of surgery.  Please wear clean clothes to the hospital/surgery center.  FAILURE TO FOLLOW THESE INSTRUCTIONS MAY RESULT IN THE CANCELLATION OF YOUR SURGERY PATIENT SIGNATURE_________________________________  NURSE SIGNATURE__________________________________  ________________________________________________________________________

## 2015-10-21 ENCOUNTER — Encounter (HOSPITAL_COMMUNITY)
Admission: RE | Admit: 2015-10-21 | Discharge: 2015-10-21 | Disposition: A | Discharge status: Home / Self Care | Payer: Medicare Other | Source: Ambulatory Visit | Attending: Surgery | Admitting: Surgery

## 2015-10-21 ENCOUNTER — Encounter (HOSPITAL_COMMUNITY): Payer: Self-pay | Admitting: *Deleted

## 2015-10-21 DIAGNOSIS — Z79899 Other long term (current) drug therapy: Secondary | ICD-10-CM | POA: Diagnosis not present

## 2015-10-21 DIAGNOSIS — K439 Ventral hernia without obstruction or gangrene: Secondary | ICD-10-CM | POA: Diagnosis not present

## 2015-10-21 DIAGNOSIS — G7000 Myasthenia gravis without (acute) exacerbation: Secondary | ICD-10-CM | POA: Diagnosis not present

## 2015-10-21 DIAGNOSIS — E119 Type 2 diabetes mellitus without complications: Secondary | ICD-10-CM | POA: Diagnosis not present

## 2015-10-21 DIAGNOSIS — G4733 Obstructive sleep apnea (adult) (pediatric): Secondary | ICD-10-CM | POA: Insufficient documentation

## 2015-10-21 DIAGNOSIS — Z6835 Body mass index (BMI) 35.0-35.9, adult: Secondary | ICD-10-CM | POA: Diagnosis not present

## 2015-10-21 DIAGNOSIS — Z01812 Encounter for preprocedural laboratory examination: Secondary | ICD-10-CM | POA: Diagnosis not present

## 2015-10-21 DIAGNOSIS — E669 Obesity, unspecified: Secondary | ICD-10-CM | POA: Diagnosis not present

## 2015-10-21 DIAGNOSIS — Z01818 Encounter for other preprocedural examination: Secondary | ICD-10-CM | POA: Insufficient documentation

## 2015-10-21 DIAGNOSIS — I10 Essential (primary) hypertension: Secondary | ICD-10-CM | POA: Diagnosis not present

## 2015-10-21 DIAGNOSIS — Z7952 Long term (current) use of systemic steroids: Secondary | ICD-10-CM | POA: Diagnosis not present

## 2015-10-21 HISTORY — DX: Gastro-esophageal reflux disease without esophagitis: K21.9

## 2015-10-21 HISTORY — DX: Chest pain, unspecified: R07.9

## 2015-10-21 HISTORY — DX: Anxiety disorder, unspecified: F41.9

## 2015-10-21 LAB — BASIC METABOLIC PANEL WITH GFR
Anion gap: 7 (ref 5–15)
BUN: 12 mg/dL (ref 6–20)
CO2: 26 mmol/L (ref 22–32)
Calcium: 9 mg/dL (ref 8.9–10.3)
Chloride: 103 mmol/L (ref 101–111)
Creatinine, Ser: 0.52 mg/dL (ref 0.44–1.00)
GFR calc Af Amer: >60 mL/min
GFR calc non Af Amer: >60 mL/min
Glucose, Bld: 148 mg/dL — ABNORMAL HIGH (ref 65–99)
Potassium: 3.9 mmol/L (ref 3.5–5.1)
Sodium: 136 mmol/L (ref 135–145)

## 2015-10-21 LAB — CBC
HCT: 36.8 % (ref 36.0–46.0)
Hemoglobin: 11.7 g/dL — ABNORMAL LOW (ref 12.0–15.0)
MCH: 26.3 pg (ref 26.0–34.0)
MCHC: 31.8 g/dL (ref 30.0–36.0)
MCV: 82.7 fL (ref 78.0–100.0)
Platelets: 295 10*3/uL (ref 150–400)
RBC: 4.45 MIL/uL (ref 3.87–5.11)
RDW: 13.7 % (ref 11.5–15.5)
WBC: 6.6 10*3/uL (ref 4.0–10.5)

## 2015-10-21 LAB — HCG, SERUM, QUALITATIVE: Preg, Serum: POSITIVE — AB

## 2015-10-21 NOTE — Consult Note (Signed)
Asked to see patient in pre-operative evaluation for Dr. Cathrine Muster hernia repair.  Pt has h/o myasthesia gravis, well-controlled on Mestinon and prednisone, HTN, Type 2 DM, Grave's, obesity, OSA (does not use CPAP), bipolar.   Past surgeries:        Thymectomy      Abdominal hernia repair      Cholecystectomy      EGD and colonoscopy  PEx:  Airway: MP 2, good TM distance and neck extension           Lungs: Clear to ausc           Heart: normal S1S2 with 1/6 SEM           Abd: obese  2015 ECHO: EF 55-60%, mild MR  Discussed risks/benefits GA with patient and husband.  She understands to continue her mestinon and prednisone as scheduled, even on day of surgery, as well as not to take hypoglycemic agents on day of surgery.

## 2015-10-21 NOTE — Progress Notes (Signed)
Spoke with Wenday in triage as CCS who stated she would let Dr Harlow Asa know about pos pregnancy test

## 2015-10-25 ENCOUNTER — Telehealth: Payer: Self-pay | Admitting: Endocrinology

## 2015-10-25 ENCOUNTER — Ambulatory Visit: Payer: Medicare Other

## 2015-10-25 DIAGNOSIS — R109 Unspecified abdominal pain: Secondary | ICD-10-CM | POA: Diagnosis not present

## 2015-10-25 DIAGNOSIS — Z79899 Other long term (current) drug therapy: Secondary | ICD-10-CM | POA: Diagnosis not present

## 2015-10-25 DIAGNOSIS — N926 Irregular menstruation, unspecified: Secondary | ICD-10-CM | POA: Diagnosis not present

## 2015-10-25 NOTE — Telephone Encounter (Signed)
She will need to stop her glimepiride and Januvia.  Will need to be on insulin if blood sugars are going up in the next week, need to schedule with Vaughan Basta and myself same day

## 2015-10-25 NOTE — Telephone Encounter (Signed)
See below and please advise, Thanks!  

## 2015-10-25 NOTE — Telephone Encounter (Signed)
FYI pt is pregnant does she need to come in or change the meds, please advise

## 2015-10-26 ENCOUNTER — Inpatient Hospital Stay (HOSPITAL_COMMUNITY): Admission: RE | Admit: 2015-10-26 | Payer: Medicare Other | Source: Ambulatory Visit | Admitting: Surgery

## 2015-10-26 ENCOUNTER — Encounter (HOSPITAL_COMMUNITY): Admission: RE | Payer: Self-pay | Source: Ambulatory Visit

## 2015-10-26 SURGERY — REPAIR, HERNIA, VENTRAL, LAPAROSCOPIC
Anesthesia: General

## 2015-10-27 DIAGNOSIS — F319 Bipolar disorder, unspecified: Secondary | ICD-10-CM | POA: Diagnosis not present

## 2015-10-27 DIAGNOSIS — R109 Unspecified abdominal pain: Secondary | ICD-10-CM | POA: Diagnosis not present

## 2015-10-27 DIAGNOSIS — E1365 Other specified diabetes mellitus with hyperglycemia: Secondary | ICD-10-CM | POA: Diagnosis not present

## 2015-10-27 DIAGNOSIS — Z3490 Encounter for supervision of normal pregnancy, unspecified, unspecified trimester: Secondary | ICD-10-CM | POA: Diagnosis not present

## 2015-10-27 NOTE — Telephone Encounter (Signed)
I contacted the pt and advised of note below. Pt verbalized understanding on D/c the Glimepriride and Januvia, but advised me her blood sugar have been running high while taking these medications. Pt sated on Monday her blood sugar 2 hrs after eating supper her sugar was 237. Pt stated this is the average for and her fasting blood sugars are just as high. Please advise, Thanks!

## 2015-10-27 NOTE — Telephone Encounter (Signed)
Just wanted to confirm if the pt needs to have any medication during this time until she is able to see Vaughan Basta?

## 2015-10-27 NOTE — Telephone Encounter (Signed)
Only metformin

## 2015-10-27 NOTE — Telephone Encounter (Signed)
Please have her scheduled to see Vaughan Basta as soon as possible to start insulin with Levemir and NovoLog.  Also to see me as soon as possible also

## 2015-10-28 NOTE — Telephone Encounter (Signed)
I contacted the pt and left a vm advising to continue taking the metforim at this time, but to call our office back as soon as possible to schedule with an appointment with linda for a insulin start and an appointment with Dr. Dwyane Dee per his request.

## 2015-11-01 ENCOUNTER — Ambulatory Visit: Payer: Medicare Other | Admitting: Internal Medicine

## 2015-11-01 DIAGNOSIS — M5412 Radiculopathy, cervical region: Secondary | ICD-10-CM | POA: Diagnosis not present

## 2015-11-01 DIAGNOSIS — Z79899 Other long term (current) drug therapy: Secondary | ICD-10-CM | POA: Diagnosis not present

## 2015-11-01 DIAGNOSIS — M542 Cervicalgia: Secondary | ICD-10-CM | POA: Diagnosis not present

## 2015-11-02 ENCOUNTER — Telehealth: Payer: Self-pay | Admitting: Endocrinology

## 2015-11-02 ENCOUNTER — Encounter: Payer: Self-pay | Admitting: Endocrinology

## 2015-11-02 ENCOUNTER — Other Ambulatory Visit: Payer: Self-pay

## 2015-11-02 ENCOUNTER — Ambulatory Visit (INDEPENDENT_AMBULATORY_CARE_PROVIDER_SITE_OTHER): Payer: Medicare Other | Admitting: Endocrinology

## 2015-11-02 VITALS — BP 96/76 | HR 95 | Wt 223.0 lb

## 2015-11-02 DIAGNOSIS — O2691 Pregnancy related conditions, unspecified, first trimester: Secondary | ICD-10-CM

## 2015-11-02 DIAGNOSIS — E1165 Type 2 diabetes mellitus with hyperglycemia: Secondary | ICD-10-CM

## 2015-11-02 MED ORDER — GLYBURIDE 2.5 MG PO TABS: 2.5000 mg | ORAL_TABLET | Freq: Every day | ORAL | 1 refills | Status: DC

## 2015-11-02 MED ORDER — LEVOTHYROXINE SODIUM 175 MCG PO TABS: 175.0000 ug | ORAL_TABLET | Freq: Every day | ORAL | 0 refills | Status: DC

## 2015-11-02 MED ORDER — INSULIN PEN NEEDLE 32G X 4 MM MISC: 1 refills | Status: DC

## 2015-11-02 MED ORDER — INSULIN DETEMIR 100 UNIT/ML FLEXPEN: 15.0000 [IU] | PEN_INJECTOR | Freq: Every day | SUBCUTANEOUS | 3 refills | Status: DC

## 2015-11-02 NOTE — Telephone Encounter (Signed)
PT requests call back from you, she needed to discuss her medications.

## 2015-11-02 NOTE — Patient Instructions (Signed)
Stop Bydureon  Levemrir insulin: This insulin provides blood sugar control for up to 24 hours.  Start with 10 units at bedtime daily and increase by 2 units every 3 days until the waking up sugars are under 100 . Then continue the same dose.  If blood sugar is under 70 for 2 days in a row, reduce the dose by 2 units. Note that this insulin does not control the rise of blood sugar with meals

## 2015-11-02 NOTE — Telephone Encounter (Signed)
I spoke with the pt and advised we are staring Levemir 15 units daily and Glyburide 2.5 daily. Pt voiced understanding on these new medications.

## 2015-11-02 NOTE — Telephone Encounter (Signed)
Pt requesting call back

## 2015-11-02 NOTE — Telephone Encounter (Signed)
We will just go with insulin and metformin

## 2015-11-02 NOTE — Telephone Encounter (Signed)
Pt called to advise me the glyburide is not covered under her insurance at this time and would like to know how to proceed. Thanks!
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
## 2015-11-03 ENCOUNTER — Observation Stay (HOSPITAL_COMMUNITY)
Admission: AD | Admit: 2015-11-03 | Discharge: 2015-11-04 | Disposition: A | Discharge status: Home / Self Care | Payer: Medicare Other | Source: Ambulatory Visit | Attending: Obstetrics & Gynecology | Admitting: Obstetrics & Gynecology

## 2015-11-03 ENCOUNTER — Inpatient Hospital Stay (HOSPITAL_COMMUNITY): Payer: Medicare Other

## 2015-11-03 ENCOUNTER — Encounter (HOSPITAL_COMMUNITY): Payer: Self-pay | Admitting: *Deleted

## 2015-11-03 ENCOUNTER — Inpatient Hospital Stay (EMERGENCY_DEPARTMENT_HOSPITAL)
Admission: AD | Admit: 2015-11-03 | Discharge: 2015-11-03 | Disposition: A | Discharge status: Home / Self Care | Payer: Medicare Other | Source: Ambulatory Visit | Attending: Family Medicine | Admitting: Family Medicine

## 2015-11-03 ENCOUNTER — Encounter (HOSPITAL_COMMUNITY): Payer: Self-pay

## 2015-11-03 DIAGNOSIS — O26899 Other specified pregnancy related conditions, unspecified trimester: Secondary | ICD-10-CM

## 2015-11-03 DIAGNOSIS — Z79891 Long term (current) use of opiate analgesic: Secondary | ICD-10-CM | POA: Insufficient documentation

## 2015-11-03 DIAGNOSIS — R1033 Periumbilical pain: Principal | ICD-10-CM

## 2015-11-03 DIAGNOSIS — O9989 Other specified diseases and conditions complicating pregnancy, childbirth and the puerperium: Secondary | ICD-10-CM

## 2015-11-03 DIAGNOSIS — O26891 Other specified pregnancy related conditions, first trimester: Secondary | ICD-10-CM | POA: Insufficient documentation

## 2015-11-03 DIAGNOSIS — R51 Headache: Secondary | ICD-10-CM | POA: Insufficient documentation

## 2015-11-03 DIAGNOSIS — Z794 Long term (current) use of insulin: Secondary | ICD-10-CM | POA: Insufficient documentation

## 2015-11-03 DIAGNOSIS — I10 Essential (primary) hypertension: Secondary | ICD-10-CM | POA: Diagnosis not present

## 2015-11-03 DIAGNOSIS — R102 Pelvic and perineal pain: Secondary | ICD-10-CM | POA: Diagnosis not present

## 2015-11-03 DIAGNOSIS — Z7984 Long term (current) use of oral hypoglycemic drugs: Secondary | ICD-10-CM | POA: Insufficient documentation

## 2015-11-03 DIAGNOSIS — R109 Unspecified abdominal pain: Secondary | ICD-10-CM | POA: Diagnosis not present

## 2015-11-03 DIAGNOSIS — E119 Type 2 diabetes mellitus without complications: Secondary | ICD-10-CM | POA: Insufficient documentation

## 2015-11-03 DIAGNOSIS — O3680X0 Pregnancy with inconclusive fetal viability, not applicable or unspecified: Secondary | ICD-10-CM

## 2015-11-03 DIAGNOSIS — Z3A01 Less than 8 weeks gestation of pregnancy: Secondary | ICD-10-CM | POA: Diagnosis not present

## 2015-11-03 DIAGNOSIS — J45909 Unspecified asthma, uncomplicated: Secondary | ICD-10-CM | POA: Insufficient documentation

## 2015-11-03 DIAGNOSIS — Z3490 Encounter for supervision of normal pregnancy, unspecified, unspecified trimester: Secondary | ICD-10-CM

## 2015-11-03 DIAGNOSIS — Z79899 Other long term (current) drug therapy: Secondary | ICD-10-CM | POA: Insufficient documentation

## 2015-11-03 DIAGNOSIS — Z7951 Long term (current) use of inhaled steroids: Secondary | ICD-10-CM | POA: Insufficient documentation

## 2015-11-03 DIAGNOSIS — Z888 Allergy status to other drugs, medicaments and biological substances status: Secondary | ICD-10-CM

## 2015-11-03 DIAGNOSIS — Z3A08 8 weeks gestation of pregnancy: Secondary | ICD-10-CM | POA: Diagnosis not present

## 2015-11-03 DIAGNOSIS — O021 Missed abortion: Secondary | ICD-10-CM | POA: Diagnosis not present

## 2015-11-03 DIAGNOSIS — Z885 Allergy status to narcotic agent status: Secondary | ICD-10-CM

## 2015-11-03 LAB — CBC WITH DIFFERENTIAL/PLATELET
Basophils Absolute: 0 10*3/uL (ref 0.0–0.1)
Basophils Relative: 0 %
Eosinophils Absolute: 0 10*3/uL (ref 0.0–0.7)
Eosinophils Relative: 0 %
HCT: 38.4 % (ref 36.0–46.0)
Hemoglobin: 12.6 g/dL (ref 12.0–15.0)
Lymphocytes Relative: 9 %
Lymphs Abs: 0.6 10*3/uL — ABNORMAL LOW (ref 0.7–4.0)
MCH: 26.5 pg (ref 26.0–34.0)
MCHC: 32.8 g/dL (ref 30.0–36.0)
MCV: 80.7 fL (ref 78.0–100.0)
Monocytes Absolute: 0.1 10*3/uL (ref 0.1–1.0)
Monocytes Relative: 1 %
Neutro Abs: 6.6 10*3/uL (ref 1.7–7.7)
Neutrophils Relative %: 90 %
Platelets: 292 10*3/uL (ref 150–400)
RBC: 4.76 MIL/uL (ref 3.87–5.11)
RDW: 13.3 % (ref 11.5–15.5)
WBC: 7.4 10*3/uL (ref 4.0–10.5)

## 2015-11-03 LAB — URINALYSIS, ROUTINE W REFLEX MICROSCOPIC
Bilirubin Urine: NEGATIVE
Glucose, UA: >1000 mg/dL — AB
Hgb urine dipstick: NEGATIVE
Ketones, ur: NEGATIVE mg/dL
Leukocytes, UA: NEGATIVE
Nitrite: NEGATIVE
Protein, ur: NEGATIVE mg/dL
Specific Gravity, Urine: <1.005 — ABNORMAL LOW (ref 1.005–1.030)
pH: 5.5 (ref 5.0–8.0)

## 2015-11-03 LAB — WET PREP, GENITAL
Clue Cells Wet Prep HPF POC: NONE SEEN
Sperm: NONE SEEN
Trich, Wet Prep: NONE SEEN
Yeast Wet Prep HPF POC: NONE SEEN

## 2015-11-03 LAB — HCG, SERUM, QUALITATIVE: Preg, Serum: POSITIVE — AB

## 2015-11-03 LAB — HCG, QUANTITATIVE, PREGNANCY: hCG, Beta Chain, Quant, S: 25784 m[IU]/mL — ABNORMAL HIGH (ref <5)

## 2015-11-03 LAB — GLUCOSE, CAPILLARY
Glucose-Capillary: 211 mg/dL — ABNORMAL HIGH (ref 65–99)
Glucose-Capillary: 141 mg/dL — ABNORMAL HIGH (ref 65–99)

## 2015-11-03 LAB — URINE MICROSCOPIC-ADD ON
Bacteria, UA: NONE SEEN
RBC / HPF: NONE SEEN RBC/hpf (ref 0–5)
WBC, UA: NONE SEEN WBC/hpf (ref 0–5)

## 2015-11-03 LAB — LACTIC ACID, PLASMA: Lactic Acid, Venous: 5 mmol/L (ref 0.5–1.9)

## 2015-11-03 MED ORDER — BUTALBITAL-APAP-CAFFEINE 50-325-40 MG PO TABS
2.0000 | ORAL_TABLET | Freq: Once | ORAL | Status: AC
  Administered 2015-11-03: 2 via ORAL
  Filled 2015-11-03: qty 2

## 2015-11-03 MED ORDER — DEXAMETHASONE SODIUM PHOSPHATE 10 MG/ML IJ SOLN
10.0000 mg | Freq: Once | INTRAMUSCULAR | Status: AC
  Administered 2015-11-03: 10 mg via INTRAVENOUS
  Filled 2015-11-03: qty 1

## 2015-11-03 MED ORDER — IBUPROFEN 800 MG PO TABS
800.0000 mg | ORAL_TABLET | Freq: Once | ORAL | Status: AC
  Administered 2015-11-03: 800 mg via ORAL
  Filled 2015-11-03: qty 1

## 2015-11-03 MED ORDER — DIPHENHYDRAMINE HCL 50 MG/ML IJ SOLN
25.0000 mg | Freq: Once | INTRAMUSCULAR | Status: AC
  Administered 2015-11-03: 25 mg via INTRAVENOUS
  Filled 2015-11-03: qty 1

## 2015-11-03 MED ORDER — SODIUM CHLORIDE 0.9 % IV SOLN
INTRAVENOUS | Status: DC
  Administered 2015-11-03: 12:00:00 via INTRAVENOUS

## 2015-11-03 MED ORDER — INSULIN ASPART 100 UNIT/ML ~~LOC~~ SOLN
2.0000 [IU] | Freq: Once | SUBCUTANEOUS | Status: AC
  Administered 2015-11-03: 2 [IU] via SUBCUTANEOUS
  Filled 2015-11-03: qty 1

## 2015-11-03 MED ORDER — METOCLOPRAMIDE HCL 5 MG/ML IJ SOLN
10.0000 mg | Freq: Once | INTRAMUSCULAR | Status: AC
  Administered 2015-11-03: 10 mg via INTRAVENOUS
  Filled 2015-11-03: qty 2

## 2015-11-03 NOTE — Discharge Instructions (Signed)
First Trimester of Pregnancy The first trimester of pregnancy is from week 1 until the end of week 12 (months 1 through 3). During this time, your baby will begin to develop inside you. At 6-8 weeks, the eyes and face are formed, and the heartbeat can be seen on ultrasound. At the end of 12 weeks, all the baby's organs are formed. Prenatal care is all the medical care you receive before the birth of your baby. Make sure you get good prenatal care and follow all of your doctor's instructions. HOME CARE  Medicines  Take medicine only as told by your doctor. Some medicines are safe and some are not during pregnancy.  Take your prenatal vitamins as told by your doctor.  Take medicine that helps you poop (stool softener) as needed if your doctor says it is okay. Diet  Eat regular, healthy meals.  Your doctor will tell you the amount of weight gain that is right for you.  Avoid raw meat and uncooked cheese.  If you feel sick to your stomach (nauseous) or throw up (vomit):  Eat 4 or 5 small meals a day instead of 3 large meals.  Try eating a few soda crackers.  Drink liquids between meals instead of during meals.  If you have a hard time pooping (constipation):  Eat high-fiber foods like fresh vegetables, fruit, and whole grains.  Drink enough fluids to keep your pee (urine) clear or pale yellow. Activity and Exercise  Exercise only as told by your doctor. Stop exercising if you have cramps or pain in your lower belly (abdomen) or low back.  Try to avoid standing for long periods of time. Move your legs often if you must stand in one place for a long time.  Avoid heavy lifting.  Wear low-heeled shoes. Sit and stand up straight.  You can have sex unless your doctor tells you not to. Relief of Pain or Discomfort  Wear a good support bra if your breasts are sore.  Take warm water baths (sitz baths) to soothe pain or discomfort caused by hemorrhoids. Use hemorrhoid cream if  your doctor says it is okay.  Rest with your legs raised if you have leg cramps or low back pain.  Wear support hose if you have puffy, bulging veins (varicose veins) in your legs. Raise (elevate) your feet for 15 minutes, 3-4 times a day. Limit salt in your diet. Prenatal Care  Schedule your prenatal visits by the twelfth week of pregnancy.  Write down your questions. Take them to your prenatal visits.  Keep all your prenatal visits as told by your doctor. Safety  Wear your seat belt at all times when driving.  Make a list of emergency phone numbers. The list should include numbers for family, friends, the hospital, and police and fire departments. General Tips  Ask your doctor for a referral to a local prenatal class. Begin classes no later than at the start of month 6 of your pregnancy.  Ask for help if you need counseling or help with nutrition. Your doctor can give you advice or tell you where to go for help.  Do not use hot tubs, steam rooms, or saunas.  Do not douche or use tampons or scented sanitary pads.  Do not cross your legs for long periods of time.  Avoid litter boxes and soil used by cats.  Avoid all smoking, herbs, and alcohol. Avoid drugs not approved by your doctor.  Do not use any tobacco products, including  cigarettes, chewing tobacco, and electronic cigarettes. If you need help quitting, ask your doctor. You may get counseling or other support to help you quit.  Visit your dentist. At home, brush your teeth with a soft toothbrush. Be gentle when you floss. GET HELP IF:  You are dizzy.  You have mild cramps or pressure in your lower belly.  You have a nagging pain in your belly area.  You continue to feel sick to your stomach, throw up, or have watery poop (diarrhea).  You have a bad smelling fluid coming from your vagina.  You have pain with peeing (urination).  You have increased puffiness (swelling) in your face, hands, legs, or ankles. GET  HELP RIGHT AWAY IF:   You have a fever.  You are leaking fluid from your vagina.  You have spotting or bleeding from your vagina.  You have very bad belly cramping or pain.  You gain or lose weight rapidly.  You throw up blood. It may look like coffee grounds.  You are around people who have [place] measles, fifth disease, or chickenpox.  You have a very bad headache.  You have shortness of breath.  You have any kind of trauma, such as from a fall or a car accident.   This information is not intended to replace advice given to you by your health care provider. Make sure you discuss any questions you have with your health care provider.   Document Released: 08/23/2007 Document Revised: 03/27/2014 Document Reviewed: 01/14/2013 Elsevier Interactive Patient Education 2016 [place] A miscarriage is the loss of an unborn baby (fetus) before the 20th week of pregnancy. The cause is often unknown.  HOME CARE  You may need to stay in bed (bed rest), or you may be able to do light activity. Go about activity as told by your doctor.  Have help at home.  Write down how many pads you use each day. Write down how soaked they are.  Do not use tampons. Do not wash out your vagina (douche) or have sex (intercourse) until your doctor approves.  Only take medicine as told by your doctor.  Do not take aspirin.  Keep all doctor visits as told.  If you or your partner have problems with grieving, talk to your doctor. You can also try counseling. Give yourself time to grieve before trying to get pregnant again. GET HELP RIGHT AWAY IF:  You have bad cramps or pain in your back or belly (abdomen).  You have a fever.  You pass large clumps of blood (clots) from your vagina that are walnut-sized or larger. Save the clumps for your doctor to see.  You pass large amounts of tissue from your vagina. Save the tissue for your doctor to see.  You have more  bleeding.  You have thick, bad-smelling fluid (discharge) coming from the vagina.  You get lightheaded, weak, or you pass out (faint).  You have chills. MAKE SURE YOU:  Understand these instructions.  Will watch your condition.  Will get help right away if you are not doing well or get worse.   This information is not intended to replace advice given to you by your health care provider. Make sure you discuss any questions you have with your health care provider.   Document Released: 05/29/2011 Document Reviewed: 05/29/2011 Elsevier Interactive Patient Education [employer].

## 2015-11-03 NOTE — MAU Note (Signed)
Has been having cramping in lower abd. Been having headaches and is feeling fatigue.  Has not been seen yet for preg.  Has appt on the 28th with nutritionist.

## 2015-11-03 NOTE — MAU Note (Signed)
Pt is diabetic, appointment with nutritionist at the end of the month.  Has been feeling cramping and headaches started this week and worsening today.  Was going to have a hernia repaired but found out she was pregnant so surgery is on hold.
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
## 2015-11-03 NOTE — Telephone Encounter (Signed)
I contacted the pt and advised of MD's instructions. Pt voiced understanding.

## 2015-11-03 NOTE — MAU Note (Signed)
Pt returns with worsening cramping in her lower abd, lower back and her legs. No bleeding.
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
## 2015-11-04 ENCOUNTER — Ambulatory Visit (INDEPENDENT_AMBULATORY_CARE_PROVIDER_SITE_OTHER): Payer: Medicare Other | Admitting: Internal Medicine

## 2015-11-04 ENCOUNTER — Encounter: Payer: Self-pay | Admitting: Internal Medicine

## 2015-11-04 ENCOUNTER — Telehealth: Payer: Self-pay | Admitting: *Deleted

## 2015-11-04 ENCOUNTER — Telehealth: Payer: Self-pay | Admitting: Neurology

## 2015-11-04 DIAGNOSIS — E890 Postprocedural hypothyroidism: Secondary | ICD-10-CM

## 2015-11-04 DIAGNOSIS — R109 Unspecified abdominal pain: Secondary | ICD-10-CM | POA: Diagnosis present

## 2015-11-04 DIAGNOSIS — E119 Type 2 diabetes mellitus without complications: Secondary | ICD-10-CM | POA: Diagnosis not present

## 2015-11-04 DIAGNOSIS — R1033 Periumbilical pain: Secondary | ICD-10-CM | POA: Diagnosis not present

## 2015-11-04 DIAGNOSIS — E669 Obesity, unspecified: Secondary | ICD-10-CM

## 2015-11-04 DIAGNOSIS — J4520 Mild intermittent asthma, uncomplicated: Secondary | ICD-10-CM

## 2015-11-04 DIAGNOSIS — G7000 Myasthenia gravis without (acute) exacerbation: Secondary | ICD-10-CM

## 2015-11-04 DIAGNOSIS — K219 Gastro-esophageal reflux disease without esophagitis: Secondary | ICD-10-CM | POA: Diagnosis not present

## 2015-11-04 DIAGNOSIS — E1169 Type 2 diabetes mellitus with other specified complication: Secondary | ICD-10-CM

## 2015-11-04 DIAGNOSIS — O0991 Supervision of high risk pregnancy, unspecified, first trimester: Secondary | ICD-10-CM

## 2015-11-04 DIAGNOSIS — G894 Chronic pain syndrome: Secondary | ICD-10-CM

## 2015-11-04 LAB — GC/CHLAMYDIA PROBE AMP (~~LOC~~) NOT AT ARMC
Chlamydia: NEGATIVE
Neisseria Gonorrhea: NEGATIVE

## 2015-11-04 LAB — GLUCOSE, CAPILLARY
Glucose-Capillary: 262 mg/dL — ABNORMAL HIGH (ref 65–99)
Glucose-Capillary: 273 mg/dL — ABNORMAL HIGH (ref 65–99)

## 2015-11-04 LAB — LACTIC ACID, PLASMA: Lactic Acid, Venous: 3.2 mmol/L (ref 0.5–1.9)

## 2015-11-04 MED ORDER — PRENATAL MULTIVITAMIN CH: 1.0000 | ORAL_TABLET | Freq: Every day | ORAL | Status: DC

## 2015-11-04 MED ORDER — INSULIN DETEMIR 100 UNIT/ML ~~LOC~~ SOLN
15.0000 [IU] | Freq: Every day | SUBCUTANEOUS | Status: DC
  Filled 2015-11-04: qty 0.15

## 2015-11-04 MED ORDER — RANITIDINE HCL 150 MG PO TABS: 150.0000 mg | ORAL_TABLET | Freq: Two times a day (BID) | ORAL | 6 refills | Status: DC

## 2015-11-04 MED ORDER — HYDROMORPHONE HCL 1 MG/ML IJ SOLN
0.5000 mg | INTRAMUSCULAR | Status: DC | PRN
  Administered 2015-11-04 (×3): 0.5 mg via INTRAVENOUS
  Filled 2015-11-04 (×3): qty 1

## 2015-11-04 MED ORDER — FLUTICASONE PROPIONATE HFA 110 MCG/ACT IN AERO: 1.0000 | INHALATION_SPRAY | Freq: Two times a day (BID) | RESPIRATORY_TRACT | 12 refills | Status: DC

## 2015-11-04 MED ORDER — LEVOTHYROXINE SODIUM 175 MCG PO TABS
175.0000 ug | ORAL_TABLET | Freq: Every day | ORAL | Status: DC
  Filled 2015-11-04: qty 1

## 2015-11-04 MED ORDER — METFORMIN HCL ER 750 MG PO TB24: 2000.0000 mg | ORAL_TABLET | Freq: Every day | ORAL | Status: DC

## 2015-11-04 MED ORDER — ONDANSETRON HCL 4 MG/2ML IJ SOLN: 4.0000 mg | Freq: Four times a day (QID) | INTRAMUSCULAR | Status: DC | PRN

## 2015-11-04 MED ORDER — ONDANSETRON HCL 4 MG PO TABS: 4.0000 mg | ORAL_TABLET | Freq: Four times a day (QID) | ORAL | Status: DC | PRN

## 2015-11-04 MED ORDER — LACTATED RINGERS IV SOLN: INTRAVENOUS | Status: DC

## 2015-11-04 NOTE — Assessment & Plan Note (Signed)
She will get labs in 4 weeks with her endo and made sure she will get these done since generally thyroid need increases up to 50% higher during pregnancy.

## 2015-11-04 NOTE — Patient Instructions (Addendum)
We need change some of the medicines.   1. STOP taking symbicort. START taking flovent 1 puff twice a day to keep the lungs healthy. It is okay to use the albuterol as needed.   2. STOP taking the omeprazole. START taking zantac 1 pill twice a day as needed for heartburn (acid reflux).   3. STOP taking the bydureon, amaryl.   4. It is okay to take tylenol, 2 pills in the morning, 2 pills around lunchtime, and 2 pills in the evening.   Keep seeing the ob doctor to monitor the pregnancy.   Make sure to get the levels checked for the thyroid as directed as the dose of the thyroid medicine often changes during pregnancy.   Please call the neurologist to let them know about the pregnancy but it is okay to keep taking the mestinon.

## 2015-11-04 NOTE — Telephone Encounter (Signed)
Noted  

## 2015-11-04 NOTE — Progress Notes (Signed)
   Subjective:    Patient ID: Jade Mathis, female    DOB: 1975-08-15, 40 y.o.   MRN: [id number]  HPI The patient is a 40 YO female coming in for consultation due to recently discovered pregnancy. She is about 7 weeks by dates and was recently discharged from [facility] hospital with stomach pains. She was found to have IUD sac but no clear yok sac and unclear if that is a viable pregnancy. She does have high HCG levels. She is on many medications and she is not sure if they are safe to take in pregnancy. She did spend a significant amount of time complaining about the service she received at her recent encounter in a negative manner. Has not been taking her symbicort for some time but using albuterol as needed.   Review of Systems  Constitutional: Positive for activity change and fatigue. Negative for appetite change, chills, fever and unexpected weight change.  HENT: Negative.   Eyes: Negative.   Respiratory: Negative for cough, chest tightness, shortness of breath and wheezing.   Cardiovascular: Negative for chest pain, palpitations and leg swelling.  Gastrointestinal: Positive for abdominal pain and nausea. Negative for abdominal distention, constipation, diarrhea and vomiting.  Musculoskeletal: Positive for myalgias. Negative for arthralgias.  Skin: Negative.   Neurological: Positive for weakness. Negative for dizziness, facial asymmetry, light-headedness, numbness and headaches.  Psychiatric/Behavioral: Positive for agitation, decreased concentration and dysphoric mood. Negative for sleep disturbance. The patient is not nervous/anxious.       Objective:   Physical Exam  Constitutional: She is oriented to person, place, and time. She appears well-developed and well-nourished.  Overweight  HENT:  Head: Normocephalic and atraumatic.  Eyes: EOM are normal.  Neck: Normal range of motion.  Cardiovascular: Normal rate and regular rhythm.   Pulmonary/Chest: Effort normal and breath sounds  normal. No respiratory distress. She has no wheezes. She has no rales.  Abdominal: Soft. Bowel sounds are normal. She exhibits no distension and no mass. There is tenderness. There is no rebound and no guarding.  Pain low abdomen bilaterally.   Musculoskeletal: She exhibits no edema.  Neurological: She is alert and oriented to person, place, and time. Coordination normal.  Skin: Skin is warm and dry.   Vitals:   11/04/15 1002  BP: 112/78  Pulse: 100  Resp: 18  Temp: 98.3 F (36.8 C)  TempSrc: Oral  SpO2: 98%  Weight: 223 lb (101.2 kg)  Height: 5\' 6"  (1.676 m)      Assessment & Plan:  Visit time 25 minutes, greater than 50% of which was spent in face to face with the patient counseling and investigating her entire medication list to ensure that she is on safe medications for pregnancy and finding several alternatives.

## 2015-11-04 NOTE — Assessment & Plan Note (Signed)
She was taking weight loss medications like phentermine likely early on in pregnancy which she has stopped. She is reminded these are not safe in pregnancy and she needs to stay off. She would not be encouraged to gain much weight during pregnancy with her starting weight.

## 2015-11-04 NOTE — Telephone Encounter (Signed)
Called patient back.  Left message for her to call me.

## 2015-11-04 NOTE — Progress Notes (Signed)
Patient called out for CBG to be taken, Caryl Pina, NT entered room to check patients CBG as delegated by RN. Patient refused for Caryl Pina to check CBG stating she did not know her. RN entered room  and explained to patient that we have two NTs on the floor and that Caryl Pina may be in the room throughout the shift. Patient refused care at this time.

## 2015-11-04 NOTE — Assessment & Plan Note (Signed)
She is getting pain meds from pain clinic in [place] and she was strongly advised that she needs to work with them on stopping her narcotics as there are documented adverse effects on the pregnancy from usage during 1st trimester and during the whole pregnancy.

## 2015-11-04 NOTE — Progress Notes (Signed)
Patient verbalizes understanding of medications and follow up appointments of discharge instructions and when to seek medical care. IV D/C without complications.   When instructions completed, husband stated he needed to leave, therefore, patient was sent home with taxi voucher per hospital. Alvy Bimler, RN escorted patient to MAU and assisted into taxi.

## 2015-11-04 NOTE — Telephone Encounter (Signed)
Left message for patient to call me back. 

## 2015-11-04 NOTE — Progress Notes (Addendum)
0120 -Patient cursing at nursing staff demanding pain medication and to speak with physician. RN explained that Pharmacy was calling physician to clarify pain medication dosing. 0122- Dr. Elonda Husky notified; pt demanding physician to come to speak with her about NPO status. 0125 -When returning to room to update patient on medication status and MD response patient was attempting to get out of bed by herself. Pt gait unsteady. Reminded patient that we discussed patient safety upon her admission and of the fall prevention safety plan. Patient states she does not require any help ambulating. Patient agitated at this time and refused assistance to bathroom by nursing staff, but allowed spouse to assist. Patient voided and assisted back to bed via stedy. RN reinterated fall prevention safety. Call bell placed within reach.

## 2015-11-04 NOTE — Telephone Encounter (Signed)
Patient called and said she had an emergency going on and wanted to speak to [facility].  I told her I needed to ask some questions since it was an emergency.  She finally said just take a message and have her call me. The message is she is pregnant and she has been at [facility] hospital all night and she is having a bad headache, weakness and having issues swallowing.  Please call the patient back.

## 2015-11-04 NOTE — Progress Notes (Signed)
Patient assisted to bathroom, pt becomes easily agitated with assistance. Gait unsteady. Patient paranoid of staff standing in room with family members. Patient states she does not trust anyone. Cursing at staff. Patient argumentative with staff.

## 2015-11-04 NOTE — Assessment & Plan Note (Signed)
PPI are not advised during pregnancy and rx for zantac to replace her omeprazole. Pharmacy called and refills stopped.

## 2015-11-04 NOTE — Telephone Encounter (Signed)
Patient found out on August 3 that she is pregnant.  She went in for pre-op for her hernia surgery and they did pregnancy test.  She is unable to have surgery at this time.  She is [redacted] weeks pregnant and due on March 29.

## 2015-11-04 NOTE — Discharge Summary (Signed)
Physician Discharge Summary  Patient ID: Jade Mathis MRN: [id number] DOB/AGE: October 01, 1975 40 y.o.  Admit date: 11/03/2015 Discharge date: 11/04/2015  Admission Diagnoses: Periumbilical abdominal pain Diabetes Elevated lactic acid, unexplained Early pregnancy uncertain viability status   Discharge Diagnoses:  Active Problems:   Abdominal pain   Discharged Condition: stable  Hospital Course: observation  Consults: None  Significant Diagnostic Studies: labs: lactic acid  Treatments: observation, sonogram  Discharge Exam: Blood pressure (!) 117/96, pulse 100, temperature 98.8 F (37.1 C), temperature source Oral, resp. rate 20, height '5\' 7"'$  (1.702 m), weight 205 lb (93 kg), last menstrual period 09/09/2015, SpO2 100 %. General appearance: alert, cooperative and no distress GI: soft, non-tender; bowel sounds normal; no masses,  no organomegaly   Will need repeat sonogram in 2 weeks to evaluate pregnancy progression  Disposition: 01-Home or Self Care  Discharge Instructions    Diet - low sodium heart healthy    Complete by:  As directed   Increase activity slowly    Complete by:  As directed       Medication List    STOP taking these medications   predniSONE 10 MG tablet Commonly known as:  DELTASONE   zolmitriptan 5 MG nasal solution Commonly known as:  ZOMIG     TAKE these medications   ACCU-CHEK AVIVA PLUS w/Device Kit Use to check blood sugar once a day dx code E11.9   ACCU-CHEK FASTCLIX LANCETS Misc Use to check blood sugar once a day dx code E11.9   albuterol 108 (90 Base) MCG/ACT inhaler Commonly known as:  PROVENTIL HFA;VENTOLIN HFA Inhale 2 puffs into the lungs every 6 (six) hours as needed for wheezing or shortness of breath.   albuterol (2.5 MG/3ML) 0.083% nebulizer solution Commonly known as:  PROVENTIL Take 3 mLs (2.5 mg total) by nebulization every 4 (four) hours as needed for wheezing or shortness of breath.   budesonide-formoterol 160-4.5  MCG/ACT inhaler Commonly known as:  SYMBICORT Inhale 2 puffs into the lungs 2 (two) times daily.   Exenatide ER 2 MG Pen Commonly known as:  BYDUREON Inject 2 mg weekly.   feeding supplement (GLUCERNA SHAKE) Liqd Take 237 mLs by mouth 3 (three) times daily between meals.   glimepiride 2 MG tablet Commonly known as:  AMARYL Take 1 tablet (2 mg total) by mouth 2 (two) times daily.   glucose blood test strip Commonly known as:  ACCU-CHEK AVIVA PLUS Use as instructed to check blood sugar once a day dx code E11.9   glyBURIDE 2.5 MG tablet Commonly known as:  DIABETA Take 1 tablet (2.5 mg total) by mouth daily with breakfast.   Insulin Detemir 100 UNIT/ML Pen Commonly known as:  LEVEMIR Inject 15 Units into the skin daily at 10 pm.   Insulin Pen Needle 32G X 4 MM Misc Use to inject insulin 1 time per day.   levothyroxine 175 MCG tablet Commonly known as:  SYNTHROID, LEVOTHROID Take 1 tablet (175 mcg total) by mouth daily.   metFORMIN 500 MG 24 hr tablet Commonly known as:  GLUCOPHAGE-XR Take 4 tablets (2,000 mg total) by mouth daily with supper.   omeprazole 40 MG capsule Commonly known as:  PRILOSEC Take 1 capsule (40 mg total) by mouth daily.   ondansetron 8 MG disintegrating tablet Commonly known as:  ZOFRAN ODT Take 1 tablet (8 mg total) by mouth every 8 (eight) hours as needed for nausea or vomiting.   oxyCODONE 15 MG immediate release tablet Commonly known as:  ROXICODONE  TAKE 1 TABLET BY MOUTH 4 TIMES DAILY   potassium chloride 10 MEQ tablet Commonly known as:  KLOR-CON 10 Take 1 tablet (10 mEq total) by mouth daily.   pyridostigmine 60 MG tablet Commonly known as:  MESTINON Take 1 tablet (60 mg total) by mouth 3 (three) times daily.   valACYclovir 500 MG tablet Commonly known as:  VALTREX Take 500 mg by mouth daily.   Vitamin D (Ergocalciferol) 50000 units Caps capsule Commonly known as:  DRISDOL Take 50,000 Units by mouth every 7 (seven) days.         SignedFlorian Buff 11/04/2015, 7:41 AM

## 2015-11-04 NOTE — Telephone Encounter (Signed)
Patient called the clinic twice today in regards to scheduling an u/s. She has a new ob appointment in this clinic 9/11 due to newly dx diabetes. Upon looking at her 8/16 u/s results a f/u u/s was recommended in 2 weeks d/t unable to visualize embryo. U/s ordered and scheduled 8/30 @ 0900. Called patient and advised of this appointment. Understanding was voiced.

## 2015-11-04 NOTE — Assessment & Plan Note (Signed)
She is taking levemir and metformin. Reminded her that bydureon and amaryl need to be stopped. Pharmacy called and refills stopped. She is not checking her sugars and reminded her that this is needed.

## 2015-11-04 NOTE — Assessment & Plan Note (Signed)
She is not taking her symbicort right now and long acting b2 agonists are not recommended. Rx for flovent to replace this as it is pregnancy category B.

## 2015-11-04 NOTE — Progress Notes (Signed)
Pre visit review using our clinic review tool, if applicable. No additional management support is needed unless otherwise documented below in the visit note. 

## 2015-11-04 NOTE — Assessment & Plan Note (Signed)
She is advised to contact her neurologist but review of her mestinon reveals it is recommended to continue and special attention and care is needed in the post-partum window for flare in mother and newborn.

## 2015-11-04 NOTE — Telephone Encounter (Signed)
PT returned your call/dawn [phone]

## 2015-11-04 NOTE — Progress Notes (Signed)
CRITICAL VALUE ALERT  Critical value received:  Lactic Acid  Date of notification:  11/04/15  Time of notification: [id number]  Critical value read back:Yes.    Nurse who received alert:  Jennye Moccasin, RN  MD notified (1st page): Dr. Elonda Husky at bedside

## 2015-11-04 NOTE — Progress Notes (Signed)
Call to report CBG 273, spoke with Dr. Elonda Husky -Hold 0030 Levemir due to NPO status.

## 2015-11-08 ENCOUNTER — Ambulatory Visit: Payer: Medicare Other | Admitting: Internal Medicine

## 2015-11-08 ENCOUNTER — Inpatient Hospital Stay (HOSPITAL_COMMUNITY): Payer: Medicare Other

## 2015-11-08 ENCOUNTER — Encounter (HOSPITAL_COMMUNITY): Payer: Self-pay

## 2015-11-08 ENCOUNTER — Inpatient Hospital Stay (HOSPITAL_COMMUNITY)
Admission: AD | Admit: 2015-11-08 | Discharge: 2015-11-08 | Disposition: A | Discharge status: Home / Self Care | Payer: Medicare Other | Source: Ambulatory Visit | Attending: Family Medicine | Admitting: Family Medicine

## 2015-11-08 DIAGNOSIS — J45909 Unspecified asthma, uncomplicated: Secondary | ICD-10-CM | POA: Diagnosis not present

## 2015-11-08 DIAGNOSIS — E119 Type 2 diabetes mellitus without complications: Secondary | ICD-10-CM | POA: Insufficient documentation

## 2015-11-08 DIAGNOSIS — I10 Essential (primary) hypertension: Secondary | ICD-10-CM | POA: Insufficient documentation

## 2015-11-08 DIAGNOSIS — R51 Headache: Secondary | ICD-10-CM | POA: Diagnosis present

## 2015-11-08 DIAGNOSIS — O26899 Other specified pregnancy related conditions, unspecified trimester: Secondary | ICD-10-CM

## 2015-11-08 DIAGNOSIS — O26891 Other specified pregnancy related conditions, first trimester: Secondary | ICD-10-CM | POA: Insufficient documentation

## 2015-11-08 DIAGNOSIS — R109 Unspecified abdominal pain: Secondary | ICD-10-CM

## 2015-11-08 DIAGNOSIS — F419 Anxiety disorder, unspecified: Secondary | ICD-10-CM | POA: Insufficient documentation

## 2015-11-08 DIAGNOSIS — K219 Gastro-esophageal reflux disease without esophagitis: Secondary | ICD-10-CM | POA: Insufficient documentation

## 2015-11-08 DIAGNOSIS — F319 Bipolar disorder, unspecified: Secondary | ICD-10-CM | POA: Diagnosis not present

## 2015-11-08 DIAGNOSIS — M797 Fibromyalgia: Secondary | ICD-10-CM | POA: Insufficient documentation

## 2015-11-08 DIAGNOSIS — R1030 Lower abdominal pain, unspecified: Secondary | ICD-10-CM | POA: Insufficient documentation

## 2015-11-08 DIAGNOSIS — O3680X0 Pregnancy with inconclusive fetal viability, not applicable or unspecified: Secondary | ICD-10-CM | POA: Diagnosis not present

## 2015-11-08 DIAGNOSIS — Z3A01 Less than 8 weeks gestation of pregnancy: Secondary | ICD-10-CM | POA: Diagnosis not present

## 2015-11-08 DIAGNOSIS — O9989 Other specified diseases and conditions complicating pregnancy, childbirth and the puerperium: Secondary | ICD-10-CM

## 2015-11-08 LAB — CBC WITH DIFFERENTIAL/PLATELET
Basophils Absolute: 0 10*3/uL (ref 0.0–0.1)
Basophils Relative: 0 %
Eosinophils Absolute: 0.2 10*3/uL (ref 0.0–0.7)
Eosinophils Relative: 3 %
HCT: 35.6 % — ABNORMAL LOW (ref 36.0–46.0)
Hemoglobin: 11.7 g/dL — ABNORMAL LOW (ref 12.0–15.0)
Lymphocytes Relative: 33 %
Lymphs Abs: 2.4 10*3/uL (ref 0.7–4.0)
MCH: 26.3 pg (ref 26.0–34.0)
MCHC: 32.9 g/dL (ref 30.0–36.0)
MCV: 80 fL (ref 78.0–100.0)
Monocytes Absolute: 0.3 10*3/uL (ref 0.1–1.0)
Monocytes Relative: 4 %
Neutro Abs: 4.3 10*3/uL (ref 1.7–7.7)
Neutrophils Relative %: 60 %
Platelets: 267 10*3/uL (ref 150–400)
RBC: 4.45 MIL/uL (ref 3.87–5.11)
RDW: 13.5 % (ref 11.5–15.5)
WBC: 7.2 10*3/uL (ref 4.0–10.5)

## 2015-11-08 LAB — URINALYSIS, ROUTINE W REFLEX MICROSCOPIC
Bilirubin Urine: NEGATIVE
Glucose, UA: >1000 mg/dL — AB
Hgb urine dipstick: NEGATIVE
Ketones, ur: NEGATIVE mg/dL
Leukocytes, UA: NEGATIVE
Nitrite: NEGATIVE
Protein, ur: NEGATIVE mg/dL
Specific Gravity, Urine: 1.02 (ref 1.005–1.030)
pH: 6 (ref 5.0–8.0)

## 2015-11-08 LAB — HCG, QUANTITATIVE, PREGNANCY: hCG, Beta Chain, Quant, S: 20943 m[IU]/mL — ABNORMAL HIGH (ref <5)

## 2015-11-08 LAB — URINE MICROSCOPIC-ADD ON
Bacteria, UA: NONE SEEN
RBC / HPF: NONE SEEN RBC/hpf (ref 0–5)
WBC, UA: NONE SEEN WBC/hpf (ref 0–5)

## 2015-11-08 NOTE — MAU Note (Signed)
Pt C/O HA, nausea, lower abd pain - became worse this morning.  Also sees "thick blood on & off."  Had some with wiping this a.m.  Feeling dizzy as well.

## 2015-11-08 NOTE — Discharge Instructions (Signed)
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
## 2015-11-09 ENCOUNTER — Ambulatory Visit (HOSPITAL_COMMUNITY): Payer: Medicare Other | Admitting: Psychiatry

## 2015-11-11 ENCOUNTER — Ambulatory Visit: Payer: Medicare Other | Admitting: Dietician

## 2015-11-11 ENCOUNTER — Ambulatory Visit (HOSPITAL_COMMUNITY): Payer: Medicare Other | Admitting: Psychiatry

## 2015-11-11 ENCOUNTER — Telehealth: Payer: Self-pay | Admitting: *Deleted

## 2015-11-11 NOTE — Telephone Encounter (Signed)
I spoke with Jade Mathis at [facility] and she said that patient has an upcoming appointment on August 31 which will be her first appointment with them so she is technically not a patient yet.  I did inform her that the patient called our after hours answering service and informed them that she is in the process of a miscarriage and is taking pills to promote miscarriage.

## 2015-11-11 NOTE — Telephone Encounter (Signed)
Noted.  Agree that she needs to go to ER and see behavior health.

## 2015-11-15 ENCOUNTER — Ambulatory Visit: Payer: Medicare Other

## 2015-11-15 ENCOUNTER — Other Ambulatory Visit: Payer: Medicare Other

## 2015-11-15 DIAGNOSIS — O021 Missed abortion: Secondary | ICD-10-CM | POA: Diagnosis not present

## 2015-11-17 ENCOUNTER — Ambulatory Visit (HOSPITAL_COMMUNITY)
Admission: RE | Admit: 2015-11-17 | Discharge: 2015-11-17 | Disposition: A | Discharge status: Home / Self Care | Payer: Medicare Other | Source: Ambulatory Visit | Attending: Family Medicine | Admitting: Family Medicine

## 2015-11-17 ENCOUNTER — Other Ambulatory Visit: Payer: Self-pay | Admitting: Family Medicine

## 2015-11-17 ENCOUNTER — Ambulatory Visit (INDEPENDENT_AMBULATORY_CARE_PROVIDER_SITE_OTHER): Payer: Medicare Other | Admitting: Certified Nurse Midwife

## 2015-11-17 ENCOUNTER — Ambulatory Visit: Payer: Medicare Other

## 2015-11-17 DIAGNOSIS — O09511 Supervision of elderly primigravida, first trimester: Secondary | ICD-10-CM | POA: Diagnosis not present

## 2015-11-17 DIAGNOSIS — O3680X0 Pregnancy with inconclusive fetal viability, not applicable or unspecified: Secondary | ICD-10-CM | POA: Insufficient documentation

## 2015-11-17 DIAGNOSIS — O0991 Supervision of high risk pregnancy, unspecified, first trimester: Secondary | ICD-10-CM

## 2015-11-17 DIAGNOSIS — O208 Other hemorrhage in early pregnancy: Secondary | ICD-10-CM | POA: Diagnosis not present

## 2015-11-17 DIAGNOSIS — Z3A00 Weeks of gestation of pregnancy not specified: Secondary | ICD-10-CM | POA: Diagnosis not present

## 2015-11-17 DIAGNOSIS — Z6790 Unspecified blood type, Rh positive: Secondary | ICD-10-CM

## 2015-11-17 DIAGNOSIS — O0289 Other abnormal products of conception: Secondary | ICD-10-CM

## 2015-11-17 DIAGNOSIS — N8312 Corpus luteum cyst of left ovary: Secondary | ICD-10-CM | POA: Insufficient documentation

## 2015-11-17 DIAGNOSIS — Z36 Encounter for antenatal screening of mother: Secondary | ICD-10-CM | POA: Diagnosis not present

## 2015-11-17 DIAGNOSIS — Z3A01 Less than 8 weeks gestation of pregnancy: Secondary | ICD-10-CM | POA: Diagnosis not present

## 2015-11-17 MED ORDER — IBUPROFEN 600 MG PO TABS: 600.0000 mg | ORAL_TABLET | Freq: Four times a day (QID) | ORAL | 1 refills | Status: DC | PRN

## 2015-11-17 MED ORDER — PROMETHAZINE HCL 25 MG PO TABS: 25.0000 mg | ORAL_TABLET | Freq: Four times a day (QID) | ORAL | 1 refills | Status: DC | PRN

## 2015-11-17 MED ORDER — ACETAMINOPHEN-CODEINE #3 300-30 MG PO TABS: 1.0000 | ORAL_TABLET | Freq: Four times a day (QID) | ORAL | 0 refills | Status: DC | PRN

## 2015-11-17 MED ORDER — MISOPROSTOL 200 MCG PO TABS: 600.0000 ug | ORAL_TABLET | Freq: Once | ORAL | 0 refills | Status: DC
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
## 2015-11-17 NOTE — Patient Instructions (Signed)
WHAT IS AN EARLY PREGNANCY FAILURE? Once the egg is fertilized with the sperm and begins to develop, it attaches to the lining of the uterus. This early pregnancy tissue may not develop into an embryo (the beginning stage of a baby). Sometimes an embryo does develop but does not continue to grow. These problems can be seen on ultrasound.   MANAGEMNT OF EARLY PREGNANCY FAILURE: About 4 out of 100 (0.25%) women will have a pregnancy loss in her lifetime.  One in five pregnancies is found to be an early pregnancy failure.  There are 3 ways to care for an early pregnancy failure:   (1) Surgery, (2) Medicine, (3) Waiting for you to pass the pregnancy on your own. The decision as to how to proceed after being diagnosed with and early pregnancy failure is an individual one.  The decision can be made only after appropriate counseling.  You need to weigh the pros and cons of the 3 choices. Then you can make the choice that works for you. SURGERY (D&E) . Procedure over in 1 day . Requires being put to sleep . Bleeding may be light . Possible problems during surgery, including injury to womb(uterus) . Care provider has more control Medicine ([place]) . The complete procedure may take days to weeks . No Surgery . Bleeding may be heavy at times . There may be drug side effects . Patient has more control Waiting . You may choose to wait, in which case your own body may complete the passing of the abnormal early pregnancy on its own in about 2-4 weeks . Your bleeding may be heavy at times . There is a small possibility that you may need surgery if the bleeding is too much or not all of the pregnancy has passed. CYTOTEC MANAGEMENT Prostaglandins (cytotec) are the most widely used drug for this purpose. They cause the uterus to cramp and contract. You will place the medicine yourself inside your vagina in the privacy of your home. Empting of the uterus should occur within 3 days but the process may continue  for several weeks. The bleeding may seem heavy at times. POSSIBLE SIDE EFFECTS FROM CYTOTEC . Nausea   Vomiting . Diarrhea Fever . Chills  Hot Flashes Side effects  from the process of the early pregnancy failure include: . Cramping  Bleeding . Headaches  Dizziness RISKS: This is a low risk procedure. Less than 1 in 100 women has a complication. An incomplete passage of the early pregnancy may occur. Also, Hemorrhage (heavy bleeding) could happen.  Rarely the pregnancy will not be passed completely. Excessively heavy bleeding may occur.  Your doctor may need to perform surgery to empty the uterus (D&E). Afterwards: Everybody will feel differently after the early pregnancy completion. You may have soreness or cramps for a day or two. You may have soreness or cramps for day or two.  You may have light bleeding for up to 2 weeks. You may be as active as you feel like being. If you have any of the following problems you may call Maternity Admissions Unit at [phone]. . If you have pain that does not get better  with pain medication . Bleeding that soaks through 2 thick full-sized sanitary pads in an hour . Cramps that last longer than 2 days . Foul smelling discharge . Fever above 100.4 degrees F Even if you do not have any of these symptoms, you should have a follow-up exam to make sure you are healing properly. This appointment  will be made for you before you leave the hospital. Your next normal period will start again in 4-6 week after the loss. You can get pregnant soon after the loss, so use birth control right away. Finally: Make sure all your questions are answered before during and after any procedure. Follow up with medical care and family planning methods.

## 2015-11-18 ENCOUNTER — Other Ambulatory Visit (HOSPITAL_COMMUNITY): Payer: Medicare Other

## 2015-11-18 ENCOUNTER — Ambulatory Visit: Payer: Medicare Other | Admitting: Endocrinology

## 2015-11-18 ENCOUNTER — Encounter: Payer: Medicaid Other | Admitting: Obstetrics & Gynecology

## 2015-11-19 ENCOUNTER — Ambulatory Visit (HOSPITAL_COMMUNITY)
Admission: RE | Admit: 2015-11-19 | Payer: Medicare Other | Source: Ambulatory Visit | Admitting: Obstetrics and Gynecology

## 2015-11-19 ENCOUNTER — Encounter (HOSPITAL_COMMUNITY): Admission: RE | Payer: Self-pay | Source: Ambulatory Visit

## 2015-11-19 DIAGNOSIS — Z5189 Encounter for other specified aftercare: Secondary | ICD-10-CM

## 2015-11-19 HISTORY — DX: Encounter for other specified aftercare: Z51.89

## 2015-11-19 SURGERY — DILATION AND EVACUATION, UTERUS
Anesthesia: Choice

## 2015-11-25 ENCOUNTER — Other Ambulatory Visit (INDEPENDENT_AMBULATORY_CARE_PROVIDER_SITE_OTHER): Payer: Medicare Other

## 2015-11-25 DIAGNOSIS — E1165 Type 2 diabetes mellitus with hyperglycemia: Secondary | ICD-10-CM | POA: Diagnosis not present

## 2015-11-25 LAB — COMPREHENSIVE METABOLIC PANEL
ALT: 10 U/L (ref 0–35)
AST: 12 U/L (ref 0–37)
Albumin: 3.7 g/dL (ref 3.5–5.2)
Alkaline Phosphatase: 55 U/L (ref 39–117)
BUN: 13 mg/dL (ref 6–23)
CO2: 30 mEq/L (ref 19–32)
Calcium: 8.8 mg/dL (ref 8.4–10.5)
Chloride: 100 mEq/L (ref 96–112)
Creatinine, Ser: 0.55 mg/dL (ref 0.40–1.20)
GFR: 156.84 mL/min (ref >60.00)
Glucose, Bld: 184 mg/dL — ABNORMAL HIGH (ref 70–99)
Potassium: 3.4 mEq/L — ABNORMAL LOW (ref 3.5–5.1)
Sodium: 134 mEq/L — ABNORMAL LOW (ref 135–145)
Total Bilirubin: 0.4 mg/dL (ref 0.2–1.2)
Total Protein: 6.9 g/dL (ref 6.0–8.3)

## 2015-11-25 LAB — TSH: TSH: 1.54 u[IU]/mL (ref 0.35–4.50)

## 2015-11-26 LAB — FRUCTOSAMINE: Fructosamine: 247 umol/L (ref 0–285)

## 2015-11-29 ENCOUNTER — Telehealth: Payer: Self-pay | Admitting: *Deleted

## 2015-11-29 ENCOUNTER — Encounter: Payer: Medicare Other | Admitting: Obstetrics and Gynecology

## 2015-11-29 DIAGNOSIS — M5412 Radiculopathy, cervical region: Secondary | ICD-10-CM | POA: Diagnosis not present

## 2015-11-29 DIAGNOSIS — Z79899 Other long term (current) drug therapy: Secondary | ICD-10-CM | POA: Diagnosis not present

## 2015-11-29 DIAGNOSIS — M542 Cervicalgia: Secondary | ICD-10-CM | POA: Diagnosis not present

## 2015-11-29 NOTE — Telephone Encounter (Signed)
According to record patient was seen by Dr. Theodis Aguas. I am not sure why she would be calling here with concerns.

## 2015-11-29 NOTE — Telephone Encounter (Signed)
Yilin left a message on voicemail this afternoon stating she has been having a miscarriage and she needs to ask some questions- states saw midwife about a week ago.

## 2015-11-30 ENCOUNTER — Ambulatory Visit: Payer: Medicare Other | Admitting: Endocrinology

## 2015-11-30 ENCOUNTER — Telehealth: Payer: Self-pay | Admitting: *Deleted

## 2015-11-30 ENCOUNTER — Ambulatory Visit (INDEPENDENT_AMBULATORY_CARE_PROVIDER_SITE_OTHER): Payer: Medicare Other | Admitting: Podiatry

## 2015-11-30 ENCOUNTER — Encounter (HOSPITAL_COMMUNITY): Payer: Self-pay | Admitting: *Deleted

## 2015-11-30 ENCOUNTER — Inpatient Hospital Stay (HOSPITAL_COMMUNITY)
Admission: AD | Admit: 2015-11-30 | Discharge: 2015-12-01 | Disposition: A | Discharge status: Home / Self Care | Payer: Medicare Other | Source: Ambulatory Visit | Attending: Obstetrics and Gynecology | Admitting: Obstetrics and Gynecology

## 2015-11-30 DIAGNOSIS — M2040 Other hammer toe(s) (acquired), unspecified foot: Secondary | ICD-10-CM

## 2015-11-30 DIAGNOSIS — Z5321 Procedure and treatment not carried out due to patient leaving prior to being seen by health care provider: Secondary | ICD-10-CM

## 2015-11-30 DIAGNOSIS — R102 Pelvic and perineal pain: Secondary | ICD-10-CM | POA: Diagnosis not present

## 2015-11-30 DIAGNOSIS — E0842 Diabetes mellitus due to underlying condition with diabetic polyneuropathy: Secondary | ICD-10-CM | POA: Diagnosis not present

## 2015-11-30 DIAGNOSIS — Q6650 Congenital pes planus, unspecified foot: Secondary | ICD-10-CM | POA: Diagnosis not present

## 2015-11-30 DIAGNOSIS — L84 Corns and callosities: Secondary | ICD-10-CM | POA: Diagnosis not present

## 2015-11-30 DIAGNOSIS — D649 Anemia, unspecified: Secondary | ICD-10-CM

## 2015-11-30 DIAGNOSIS — O021 Missed abortion: Secondary | ICD-10-CM | POA: Insufficient documentation

## 2015-11-30 MED ORDER — LACTATED RINGERS IV BOLUS (SEPSIS): 500.0000 mL | Freq: Once | INTRAVENOUS | Status: DC

## 2015-11-30 MED ORDER — DIPHENHYDRAMINE HCL 25 MG PO CAPS: 25.0000 mg | ORAL_CAPSULE | Freq: Once | ORAL | Status: DC

## 2015-11-30 MED ORDER — ACETAMINOPHEN 325 MG PO TABS: 650.0000 mg | ORAL_TABLET | Freq: Once | ORAL | Status: DC

## 2015-11-30 MED ORDER — SODIUM CHLORIDE 0.9 % IV SOLN: Freq: Once | INTRAVENOUS | Status: DC

## 2015-11-30 MED ORDER — LACTATED RINGERS IV SOLN: INTRAVENOUS | Status: DC

## 2015-11-30 NOTE — Progress Notes (Signed)
Patient ID: Jade Mathis, female   DOB: 05/24/1975, 40 y.o.   MRN: [id number]    Subjective: Patient presents today for dispensing diabetic shoes 2 and custom insoles multilaminated 6  Indications for shoes Diabetic neuropathy Hammertoe deformity Pes planus Pre-ulcerative calluses    Plan: Patient presents for diabetic shoe pick up, shoes are tried on for good fit.  Patient received 1 pair [employer] 1540V2 silver/mint green in women's size 11 wide and 3 pairs custom molded diabetic inserts.  Verbal and written break in and wear instructions given.

## 2015-11-30 NOTE — Telephone Encounter (Signed)
Patient called from her OB/Gyn to let us know that she is actively having a miscarriage.  Since the baby wasn't developing properly her doctor gave her medication to induce miscarriage.  She said that she has lost a lot of blood and is very weak.  They are going to do a D & C but she does not know if she will be admitted.  This was just an FYI for [place].

## 2015-11-30 NOTE — MAU Note (Signed)
PT SAYS    SHE HAD  AN  U/S  -  BABY NOT  DEV.   SO SHE  TOOK  MED  LAST FRI.   TODAY    WAS AT OFFICE  -   HAS LARGE  BLOOD CLOT  IN VAG.      WAS SUPPOSE  TO START   MED TONIGHT  BUT  DID  NOT BUY- INSURANCE  WILL NOT  PAY-   SHE  CALLED  VICKI,  CNM       .  NOT  BLEEDING  NOW.

## 2015-11-30 NOTE — Patient Instructions (Signed)

## 2015-12-01 ENCOUNTER — Encounter (HOSPITAL_COMMUNITY)
Admission: AD | Disposition: A | Discharge status: Home / Self Care | Payer: Self-pay | Source: Ambulatory Visit | Attending: Obstetrics and Gynecology

## 2015-12-01 ENCOUNTER — Ambulatory Visit (HOSPITAL_COMMUNITY): Payer: Medicare Other | Admitting: Psychiatry

## 2015-12-01 ENCOUNTER — Encounter (HOSPITAL_COMMUNITY): Payer: Self-pay | Admitting: *Deleted

## 2015-12-01 ENCOUNTER — Ambulatory Visit (HOSPITAL_COMMUNITY): Payer: Medicare Other | Admitting: Anesthesiology

## 2015-12-01 ENCOUNTER — Observation Stay (HOSPITAL_COMMUNITY)
Admission: AD | Admit: 2015-12-01 | Discharge: 2015-12-02 | Disposition: A | Discharge status: Home / Self Care | Payer: Medicare Other | Source: Ambulatory Visit | Attending: Obstetrics and Gynecology | Admitting: Obstetrics and Gynecology

## 2015-12-01 ENCOUNTER — Other Ambulatory Visit: Payer: Self-pay | Admitting: Obstetrics and Gynecology

## 2015-12-01 DIAGNOSIS — E119 Type 2 diabetes mellitus without complications: Secondary | ICD-10-CM | POA: Diagnosis not present

## 2015-12-01 DIAGNOSIS — J45909 Unspecified asthma, uncomplicated: Secondary | ICD-10-CM | POA: Insufficient documentation

## 2015-12-01 DIAGNOSIS — Z79899 Other long term (current) drug therapy: Secondary | ICD-10-CM | POA: Insufficient documentation

## 2015-12-01 DIAGNOSIS — Z794 Long term (current) use of insulin: Secondary | ICD-10-CM | POA: Diagnosis not present

## 2015-12-01 DIAGNOSIS — O034 Incomplete spontaneous abortion without complication: Principal | ICD-10-CM | POA: Insufficient documentation

## 2015-12-01 DIAGNOSIS — K219 Gastro-esophageal reflux disease without esophagitis: Secondary | ICD-10-CM | POA: Diagnosis not present

## 2015-12-01 DIAGNOSIS — D62 Acute posthemorrhagic anemia: Secondary | ICD-10-CM

## 2015-12-01 DIAGNOSIS — I10 Essential (primary) hypertension: Secondary | ICD-10-CM | POA: Diagnosis not present

## 2015-12-01 DIAGNOSIS — M797 Fibromyalgia: Secondary | ICD-10-CM | POA: Diagnosis not present

## 2015-12-01 HISTORY — PX: DILATION AND EVACUATION: SHX1459

## 2015-12-01 LAB — CBC
HCT: 19.4 % — ABNORMAL LOW (ref 36.0–46.0)
HCT: 20.6 % — ABNORMAL LOW (ref 36.0–46.0)
Hemoglobin: 6.3 g/dL — CL (ref 12.0–15.0)
Hemoglobin: 6.6 g/dL — CL (ref 12.0–15.0)
MCH: 26.8 pg (ref 26.0–34.0)
MCH: 27.4 pg (ref 26.0–34.0)
MCHC: 32 g/dL (ref 30.0–36.0)
MCHC: 32.5 g/dL (ref 30.0–36.0)
MCV: 82.6 fL (ref 78.0–100.0)
MCV: 85.5 fL (ref 78.0–100.0)
Platelets: 188 10*3/uL (ref 150–400)
Platelets: 222 10*3/uL (ref 150–400)
RBC: 2.35 MIL/uL — ABNORMAL LOW (ref 3.87–5.11)
RBC: 2.41 MIL/uL — ABNORMAL LOW (ref 3.87–5.11)
RDW: 13.4 % (ref 11.5–15.5)
RDW: 14.1 % (ref 11.5–15.5)
WBC: 6.6 10*3/uL (ref 4.0–10.5)
WBC: 8 10*3/uL (ref 4.0–10.5)

## 2015-12-01 LAB — PREPARE RBC (CROSSMATCH)

## 2015-12-01 LAB — ABO/RH: ABO/RH(D): O POS

## 2015-12-01 LAB — GLUCOSE, CAPILLARY
Glucose-Capillary: 115 mg/dL — ABNORMAL HIGH (ref 65–99)
Glucose-Capillary: 148 mg/dL — ABNORMAL HIGH (ref 65–99)
Glucose-Capillary: 256 mg/dL — ABNORMAL HIGH (ref 65–99)
Glucose-Capillary: 298 mg/dL — ABNORMAL HIGH (ref 65–99)

## 2015-12-01 SURGERY — DILATION AND EVACUATION, UTERUS
Anesthesia: General | Site: Vagina

## 2015-12-01 MED ORDER — BUDESONIDE 0.25 MG/2ML IN SUSP
0.2500 mg | Freq: Two times a day (BID) | RESPIRATORY_TRACT | Status: DC
  Administered 2015-12-01: 0.25 mg via RESPIRATORY_TRACT
  Filled 2015-12-01 (×2): qty 2

## 2015-12-01 MED ORDER — SODIUM CHLORIDE 0.9 % IV SOLN: Freq: Once | INTRAVENOUS | Status: DC

## 2015-12-01 MED ORDER — ROCURONIUM BROMIDE 100 MG/10ML IV SOLN
INTRAVENOUS | Status: AC
  Filled 2015-12-01: qty 1

## 2015-12-01 MED ORDER — ONDANSETRON HCL 4 MG/2ML IJ SOLN
INTRAMUSCULAR | Status: AC
  Filled 2015-12-01: qty 2

## 2015-12-01 MED ORDER — INSULIN DETEMIR 100 UNIT/ML ~~LOC~~ SOLN
15.0000 [IU] | Freq: Every day | SUBCUTANEOUS | Status: DC
  Administered 2015-12-01: 15 [IU] via SUBCUTANEOUS
  Filled 2015-12-01: qty 0.15

## 2015-12-01 MED ORDER — MEPERIDINE HCL 25 MG/ML IJ SOLN: 6.2500 mg | INTRAMUSCULAR | Status: DC | PRN

## 2015-12-01 MED ORDER — ALBUTEROL SULFATE (2.5 MG/3ML) 0.083% IN NEBU: 3.0000 mL | INHALATION_SOLUTION | Freq: Four times a day (QID) | RESPIRATORY_TRACT | Status: DC | PRN

## 2015-12-01 MED ORDER — ALBUTEROL SULFATE (2.5 MG/3ML) 0.083% IN NEBU: 2.5000 mg | INHALATION_SOLUTION | RESPIRATORY_TRACT | Status: DC | PRN

## 2015-12-01 MED ORDER — DIPHENHYDRAMINE HCL 50 MG/ML IJ SOLN
INTRAMUSCULAR | Status: AC
  Filled 2015-12-01: qty 1

## 2015-12-01 MED ORDER — VALACYCLOVIR HCL 500 MG PO TABS
500.0000 mg | ORAL_TABLET | Freq: Every day | ORAL | Status: DC
  Filled 2015-12-01: qty 1

## 2015-12-01 MED ORDER — GLYBURIDE 2.5 MG PO TABS
2.5000 mg | ORAL_TABLET | Freq: Every day | ORAL | Status: DC
  Filled 2015-12-01: qty 1

## 2015-12-01 MED ORDER — OXYCODONE-ACETAMINOPHEN 5-325 MG PO TABS
1.0000 | ORAL_TABLET | ORAL | Status: DC | PRN
  Administered 2015-12-01: 1 via ORAL

## 2015-12-01 MED ORDER — MIDAZOLAM HCL 2 MG/2ML IJ SOLN
INTRAMUSCULAR | Status: AC
  Filled 2015-12-01: qty 2

## 2015-12-01 MED ORDER — LIDOCAINE HCL (CARDIAC) 20 MG/ML IV SOLN
INTRAVENOUS | Status: DC | PRN
  Administered 2015-12-01: 30 mg via INTRAVENOUS
  Administered 2015-12-01: 70 mg via INTRAVENOUS

## 2015-12-01 MED ORDER — SODIUM CHLORIDE 0.9 % IV SOLN
Freq: Once | INTRAVENOUS | Status: AC
  Administered 2015-12-01: 19:00:00 via INTRAVENOUS

## 2015-12-01 MED ORDER — METHYLERGONOVINE MALEATE 0.2 MG PO TABS
ORAL_TABLET | ORAL | Status: AC
  Administered 2015-12-01: 01:00:00
  Filled 2015-12-01: qty 1

## 2015-12-01 MED ORDER — ONDANSETRON HCL 4 MG/2ML IJ SOLN: 4.0000 mg | Freq: Once | INTRAMUSCULAR | Status: DC | PRN

## 2015-12-01 MED ORDER — SUCCINYLCHOLINE CHLORIDE 200 MG/10ML IV SOSY
PREFILLED_SYRINGE | INTRAVENOUS | Status: AC
  Filled 2015-12-01: qty 10

## 2015-12-01 MED ORDER — DEXAMETHASONE SODIUM PHOSPHATE 4 MG/ML IJ SOLN
INTRAMUSCULAR | Status: AC
  Filled 2015-12-01: qty 1

## 2015-12-01 MED ORDER — FENTANYL CITRATE (PF) 100 MCG/2ML IJ SOLN
INTRAMUSCULAR | Status: DC | PRN
  Administered 2015-12-01 (×2): 50 ug via INTRAVENOUS

## 2015-12-01 MED ORDER — DEXAMETHASONE SODIUM PHOSPHATE 10 MG/ML IJ SOLN
INTRAMUSCULAR | Status: DC | PRN
  Administered 2015-12-01: 4 mg via INTRAVENOUS

## 2015-12-01 MED ORDER — PROPOFOL 10 MG/ML IV BOLUS
INTRAVENOUS | Status: AC
  Filled 2015-12-01: qty 20

## 2015-12-01 MED ORDER — FENTANYL CITRATE (PF) 100 MCG/2ML IJ SOLN
25.0000 ug | INTRAMUSCULAR | Status: DC | PRN
  Administered 2015-12-01: 50 ug via INTRAVENOUS
  Administered 2015-12-01 (×2): 25 ug via INTRAVENOUS
  Administered 2015-12-01: 50 ug via INTRAVENOUS

## 2015-12-01 MED ORDER — KETOROLAC TROMETHAMINE 30 MG/ML IJ SOLN: 30.0000 mg | Freq: Once | INTRAMUSCULAR | Status: DC

## 2015-12-01 MED ORDER — LIDOCAINE HCL 2 % IJ SOLN
INTRAMUSCULAR | Status: AC
  Filled 2015-12-01: qty 20

## 2015-12-01 MED ORDER — LIDOCAINE HCL 2 % IJ SOLN
INTRAMUSCULAR | Status: DC | PRN
  Administered 2015-12-01: 10 mL

## 2015-12-01 MED ORDER — ONDANSETRON HCL 4 MG/2ML IJ SOLN
INTRAMUSCULAR | Status: DC | PRN
  Administered 2015-12-01: 4 mg via INTRAVENOUS

## 2015-12-01 MED ORDER — SCOPOLAMINE 1 MG/3DAYS TD PT72
MEDICATED_PATCH | TRANSDERMAL | Status: AC
  Filled 2015-12-01: qty 1

## 2015-12-01 MED ORDER — MIDAZOLAM HCL 2 MG/2ML IJ SOLN
INTRAMUSCULAR | Status: DC | PRN
Start: 2015-12-01 — End: 2015-12-01
  Administered 2015-12-01: 1 mg via INTRAVENOUS

## 2015-12-01 MED ORDER — KETOROLAC TROMETHAMINE 30 MG/ML IJ SOLN
INTRAMUSCULAR | Status: AC
  Filled 2015-12-01: qty 1

## 2015-12-01 MED ORDER — IBUPROFEN 600 MG PO TABS: 600.0000 mg | ORAL_TABLET | Freq: Four times a day (QID) | ORAL | 1 refills | Status: DC | PRN

## 2015-12-01 MED ORDER — FENTANYL CITRATE (PF) 100 MCG/2ML IJ SOLN
INTRAMUSCULAR | Status: AC
  Filled 2015-12-01: qty 2

## 2015-12-01 MED ORDER — DOXYCYCLINE HYCLATE 100 MG IV SOLR
100.0000 mg | Freq: Once | INTRAVENOUS | Status: AC
  Administered 2015-12-01: 100 mg via INTRAVENOUS
  Filled 2015-12-01: qty 100

## 2015-12-01 MED ORDER — SCOPOLAMINE 1 MG/3DAYS TD PT72: 1.0000 | MEDICATED_PATCH | TRANSDERMAL | Status: DC

## 2015-12-01 MED ORDER — LIDOCAINE HCL (CARDIAC) 20 MG/ML IV SOLN
INTRAVENOUS | Status: AC
  Filled 2015-12-01: qty 5

## 2015-12-01 MED ORDER — DIPHENHYDRAMINE HCL 50 MG/ML IJ SOLN
12.5000 mg | Freq: Once | INTRAMUSCULAR | Status: AC
  Administered 2015-12-01: 12.5 mg via INTRAVENOUS

## 2015-12-01 MED ORDER — ACETAMINOPHEN 325 MG PO TABS
650.0000 mg | ORAL_TABLET | Freq: Four times a day (QID) | ORAL | Status: DC | PRN
Start: 2015-12-01 — End: 2015-12-02
  Administered 2015-12-01: 650 mg via ORAL
  Filled 2015-12-01: qty 2

## 2015-12-01 MED ORDER — FAMOTIDINE 20 MG PO TABS: 20.0000 mg | ORAL_TABLET | Freq: Every day | ORAL | Status: DC | PRN

## 2015-12-01 MED ORDER — LEVOTHYROXINE SODIUM 175 MCG PO TABS
175.0000 ug | ORAL_TABLET | Freq: Every day | ORAL | Status: DC
  Filled 2015-12-01: qty 1

## 2015-12-01 MED ORDER — OXYCODONE-ACETAMINOPHEN 5-325 MG PO TABS
ORAL_TABLET | ORAL | Status: AC
  Filled 2015-12-01: qty 1

## 2015-12-01 MED ORDER — DIPHENHYDRAMINE HCL 25 MG PO CAPS
25.0000 mg | ORAL_CAPSULE | Freq: Once | ORAL | Status: DC
  Filled 2015-12-01: qty 1

## 2015-12-01 MED ORDER — KETOROLAC TROMETHAMINE 30 MG/ML IJ SOLN
INTRAMUSCULAR | Status: DC | PRN
  Administered 2015-12-01: 30 mg via INTRAVENOUS

## 2015-12-01 MED ORDER — ONDANSETRON 8 MG PO TBDP
8.0000 mg | ORAL_TABLET | Freq: Three times a day (TID) | ORAL | Status: DC | PRN
  Filled 2015-12-01: qty 1

## 2015-12-01 MED ORDER — PROPOFOL 10 MG/ML IV BOLUS
INTRAVENOUS | Status: DC | PRN
  Administered 2015-12-01: 200 mg via INTRAVENOUS

## 2015-12-01 MED ORDER — PYRIDOSTIGMINE BROMIDE 60 MG PO TABS
60.0000 mg | ORAL_TABLET | Freq: Three times a day (TID) | ORAL | Status: DC
  Administered 2015-12-01: 60 mg via ORAL
  Filled 2015-12-01 (×2): qty 1

## 2015-12-01 MED ORDER — PHENYLEPHRINE HCL 10 MG/ML IJ SOLN
INTRAMUSCULAR | Status: DC | PRN
  Administered 2015-12-01: 120 ug via INTRAVENOUS

## 2015-12-01 MED ORDER — METFORMIN HCL ER 750 MG PO TB24
2000.0000 mg | ORAL_TABLET | Freq: Every day | ORAL | Status: DC
  Administered 2015-12-01: 2000 mg via ORAL
  Filled 2015-12-01: qty 1

## 2015-12-01 MED ORDER — LACTATED RINGERS IV SOLN
INTRAVENOUS | Status: DC
  Administered 2015-12-01 (×2): via INTRAVENOUS

## 2015-12-01 MED ORDER — ACETAMINOPHEN 325 MG PO TABS
650.0000 mg | ORAL_TABLET | Freq: Once | ORAL | Status: AC
  Administered 2015-12-01: 650 mg via ORAL
  Filled 2015-12-01: qty 2

## 2015-12-01 SURGICAL SUPPLY — 20 items
CATH ROBINSON RED A/P 16FR (CATHETERS) ×2 IMPLANT
CLOTH BEACON ORANGE TIMEOUT ST (SAFETY) ×2 IMPLANT
DECANTER SPIKE VIAL GLASS SM (MISCELLANEOUS) ×2 IMPLANT
GLOVE BIO SURGEON STRL SZ7.5 (GLOVE) ×2 IMPLANT
GLOVE BIOGEL PI IND STRL 7.0 (GLOVE) ×1 IMPLANT
GLOVE BIOGEL PI IND STRL 7.5 (GLOVE) ×2 IMPLANT
GLOVE BIOGEL PI INDICATOR 7.0 (GLOVE) ×1
GLOVE BIOGEL PI INDICATOR 7.5 (GLOVE) ×2
GOWN STRL REUS W/TWL LRG LVL3 (GOWN DISPOSABLE) ×4 IMPLANT
KIT BERKELEY 1ST TRIMESTER 3/8 (MISCELLANEOUS) ×2 IMPLANT
NS IRRIG 1000ML POUR BTL (IV SOLUTION) ×2 IMPLANT
PACK VAGINAL MINOR WOMEN LF (CUSTOM PROCEDURE TRAY) ×2 IMPLANT
PAD OB MATERNITY 4.3X12.25 (PERSONAL CARE ITEMS) ×2 IMPLANT
PAD PREP 24X48 CUFFED NSTRL (MISCELLANEOUS) ×2 IMPLANT
SET BERKELEY SUCTION TUBING (SUCTIONS) ×2 IMPLANT
TOWEL OR 17X24 6PK STRL BLUE (TOWEL DISPOSABLE) ×4 IMPLANT
VACURETTE 10 RIGID CVD (CANNULA) IMPLANT
VACURETTE 7MM CVD STRL WRAP (CANNULA) ×2 IMPLANT
VACURETTE 8 RIGID CVD (CANNULA) IMPLANT
VACURETTE 9 RIGID CVD (CANNULA) IMPLANT

## 2015-12-01 NOTE — OR Nursing (Signed)
Dr Jillyn Hidden, anesthesia aware of recent CBC of hb 6.3. No orders at this time. Kristine Royal, RN

## 2015-12-01 NOTE — Progress Notes (Signed)
Day of Surgery Procedure(s) (LRB): DILATATION AND EVACUATION (N/A)  Subjective: Patient reported feeling light headed and a little dizzy in recovery room.  Pts hemoglobin was 6 last night.  I discussed transfusion risks benefits and alternatives and pt would like to proceed with transfusion.  Objective: I have reviewed patient's vital signs.  General: alert and no distress Resp: clear to auscultation bilaterally Cardio: regular rate and rhythm Extremities: no calf tenderness Vaginal Bleeding: minimal  Assessment: s/p Procedure(s): DILATATION AND EVACUATION (N/A): symptomatic anemia  Plan: transfuse 2 units PRBCs  Check CBC   LOS: 0 days    Jade Mathis Y 12/01/2015, 5:07 PM

## 2015-12-01 NOTE — Op Note (Signed)
Preop Diagnosis: Incomplete Abortion  Postop Diagnosis: Incomplete Abortion   Procedure: DILATATION AND EVACUATION/SUCTION D&C  Anesthesia: Monitor Anesthesia Care   Attending: Everett Graff, MD   Assistant: N/a  Findings: Mod POCs  Pathology: POCs  Fluids: 1200 cc  UOP: 20 cc via straight cath.  Pt voided prior to procedure.  EBL: 10 cc  Complications: None  Procedure: The patient was taken to the operating room after the risks benefits and alternatives were discussed with the patient, the patient verbalized understanding and consent signed and witnessed.  The patient was placed under MAC anesthesia, prepped and draped in the normal sterile fashion and a time out was performed.  A bivalve speculum was placed in the patient's vagina and the anterior lip of the cervix grasped with a single-tooth tenaculum. A paracervical block was administered using a total of 10 cc of 2% lidocaine.  The uterus was sounded to 8.5 cm and a size 7 suction curette was used. Suction curettage was performed until minimal tissue returned. Sharp curettage was performed until a gritty texture was noted. Suction curettage was performed once again to remove any remaining debris. All instruments were removed. The count was correct. The patient was transferred to the recovery room in good condition.

## 2015-12-01 NOTE — Telephone Encounter (Signed)
Called [place] and left a message I was returning her call- and if still has a question may call us back or if she has another provider call that office. Do see in chart she is having D&C with [place]

## 2015-12-01 NOTE — Anesthesia Preprocedure Evaluation (Addendum)
Anesthesia Evaluation  Patient identified by MRN, date of birth, ID band Patient awake    Reviewed: Allergy & Precautions, NPO status , Patient's Chart, lab work & pertinent test results  Airway Mallampati: II  TM Distance: >3 FB Neck ROM: Full    Dental  (+) Teeth Intact, Dental Advisory Given   Pulmonary asthma , sleep apnea ,    Pulmonary exam normal breath sounds clear to auscultation       Cardiovascular hypertension, Normal cardiovascular exam Rhythm:Regular Rate:Normal     Neuro/Psych  Headaches, PSYCHIATRIC DISORDERS Anxiety Depression Bipolar Disorder  Neuromuscular disease ([place])    GI/Hepatic Neg liver ROS, GERD  ,  Endo/Other  diabetesGraves disease  Renal/GU negative Renal ROS     Musculoskeletal  (+) Fibromyalgia -  Abdominal   Peds  Hematology  (+) Blood dyscrasia, anemia ,   Anesthesia Other Findings Day of surgery medications reviewed with the patient.  Reproductive/Obstetrics                             Anesthesia Physical Anesthesia Plan  ASA: III  Anesthesia Plan: General   Post-op Pain Management:    Induction: Intravenous  Airway Management Planned: Oral ETT  Additional Equipment:   Intra-op Plan:   Post-operative Plan: Extubation in OR  Informed Consent: I have reviewed the patients History and Physical, chart, labs and discussed the procedure including the risks, benefits and alternatives for the proposed anesthesia with the patient or authorized representative who has indicated his/her understanding and acceptance.   Dental advisory given  Plan Discussed with: CRNA  Anesthesia Plan Comments: (Risks/benefits of general anesthesia discussed with patient including risk of damage to teeth, lips, gum, and tongue, nausea/vomiting, allergic reactions to medications, and the possibility of heart attack, stroke and death.  All patient questions answered.  Patient  wishes to proceed.)        Anesthesia Quick Evaluation
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
## 2015-12-01 NOTE — Discharge Instructions (Signed)
DISCHARGE INSTRUCTIONS: D&C / D&E The following instructions have been prepared to help you care for yourself upon your return home.   Personal hygiene:  Use sanitary pads for vaginal drainage, not tampons.  Shower the day after your procedure.  NO tub baths, pools or Jacuzzis for 2-3 weeks.  Wipe front to back after using the bathroom.  Activity and limitations:  Do NOT drive or operate any equipment for 24 hours. The effects of anesthesia are still present and drowsiness may result.  Do NOT rest in bed all day.  Walking is encouraged.  Walk up and down stairs slowly.  You may resume your normal activity in one to two days or as indicated by your physician.  Sexual activity: NO intercourse for at least 2 weeks after the procedure, or as indicated by your physician.  Diet: Eat a light meal as desired this evening. You may resume your usual diet tomorrow.  Return to work: You may resume your work activities in one to two days or as indicated by your doctor.  What to expect after your surgery: Expect to have vaginal bleeding/discharge for 2-3 days and spotting for up to 10 days. It is not unusual to have soreness for up to 1-2 weeks. You may have a slight burning sensation when you urinate for the first day. Mild cramps may continue for a couple of days. You may have a regular period in 2-6 weeks.  Call your doctor for any of the following:  Excessive vaginal bleeding, saturating and changing one pad every hour.  Inability to urinate 6 hours after discharge from hospital.  Pain not relieved by pain medication.  Fever of 100.4 F or greater.  Unusual vaginal discharge or odor.  Start taking ferrous sulfate (iron pills) 325mg  twice daily. You may pick this up over the counter at your pharmacy.  Call MD for follow up in 2 weeks.   Patients signature: ______________________  Nurses signature ________________________  Support person's  signature_______________________

## 2015-12-01 NOTE — MAU Note (Signed)
Patient walked out AMA 
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
## 2015-12-01 NOTE — Transfer of Care (Signed)
Immediate Anesthesia Transfer of Care Note  Patient: Jade Mathis  Procedure(s) Performed: Procedure(s): DILATATION AND EVACUATION (N/A)  Patient Location: PACU  Anesthesia Type:General  Level of Consciousness: awake, alert , oriented and patient cooperative  Airway & Oxygen Therapy: Patient Spontanous Breathing and Patient connected to nasal cannula oxygen  Post-op Assessment: Report given to RN and Post -op Vital signs reviewed and stable  Post vital signs: Reviewed and stable  Last Vitals:  Vitals:   12/01/15 1106  BP: 108/69  Pulse: 95  Resp: 20  Temp: 36.7 C    Last Pain:  Vitals:   12/01/15 1106  TempSrc: Oral      Patients Stated Pain Goal: 3 ([id number])  Complications: No apparent anesthesia complications

## 2015-12-01 NOTE — Anesthesia Postprocedure Evaluation (Signed)
Anesthesia Post Note  Patient: Jade Mathis  Procedure(s) Performed: Procedure(s) (LRB): DILATATION AND EVACUATION (N/A)  Patient location during evaluation: PACU Anesthesia Type: MAC Level of consciousness: awake Pain management: pain level controlled Vital Signs Assessment: post-procedure vital signs reviewed and stable Respiratory status: spontaneous breathing Cardiovascular status: stable Postop Assessment: no signs of nausea or vomiting Anesthetic complications: no     Last Vitals:  Vitals:   12/01/15 1106 12/01/15 1443  BP: 108/69   Pulse: 95   Resp: 20   Temp: 36.7 C 36.5 C    Last Pain:  Vitals:   12/01/15 1106  TempSrc: Oral   Pain Goal: Patients Stated Pain Goal: 3 (12/01/15 1106)               [place]

## 2015-12-01 NOTE — Anesthesia Procedure Notes (Signed)
Procedure Name: LMA Insertion Date/Time: 12/01/2015 2:14 PM Performed by: Tobin Chad Pre-anesthesia Checklist: Patient identified, Timeout performed, Emergency Drugs available, Suction available and Patient being monitored Patient Re-evaluated:Patient Re-evaluated prior to inductionOxygen Delivery Method: Circle system utilized and Simple face mask Preoxygenation: Pre-oxygenation with 100% oxygen Intubation Type: IV induction Ventilation: Mask ventilation without difficulty LMA: LMA inserted LMA Size: 4.0 Grade View: Grade II Number of attempts: 1 Placement Confirmation: breath sounds checked- equal and bilateral and positive ETCO2 Tube secured with: Tape Dental Injury: Teeth and Oropharynx as per pre-operative assessment

## 2015-12-01 NOTE — MAU Note (Signed)
Patient let provider know she wanted to leave AMA. Provider d/c IV and gave patient AMA paper to sign.

## 2015-12-02 ENCOUNTER — Encounter (HOSPITAL_COMMUNITY): Payer: Self-pay | Admitting: Obstetrics and Gynecology

## 2015-12-02 DIAGNOSIS — O034 Incomplete spontaneous abortion without complication: Secondary | ICD-10-CM | POA: Diagnosis not present

## 2015-12-02 LAB — CBC WITH DIFFERENTIAL/PLATELET
Basophils Absolute: 0 10*3/uL (ref 0.0–0.1)
Basophils Relative: 0 %
Eosinophils Absolute: 0 10*3/uL (ref 0.0–0.7)
Eosinophils Relative: 0 %
HCT: 26 % — ABNORMAL LOW (ref 36.0–46.0)
Hemoglobin: 8.8 g/dL — ABNORMAL LOW (ref 12.0–15.0)
Lymphocytes Relative: 13 %
Lymphs Abs: 1.2 10*3/uL (ref 0.7–4.0)
MCH: 28.2 pg (ref 26.0–34.0)
MCHC: 33.8 g/dL (ref 30.0–36.0)
MCV: 83.3 fL (ref 78.0–100.0)
Monocytes Absolute: 0.3 10*3/uL (ref 0.1–1.0)
Monocytes Relative: 4 %
Neutro Abs: 7.7 10*3/uL (ref 1.7–7.7)
Neutrophils Relative %: 83 %
Platelets: 198 10*3/uL (ref 150–400)
RBC: 3.12 MIL/uL — ABNORMAL LOW (ref 3.87–5.11)
RDW: 14.3 % (ref 11.5–15.5)
WBC: 9.3 10*3/uL (ref 4.0–10.5)

## 2015-12-02 NOTE — Progress Notes (Signed)
Patient discharged home with husband. Discharge paperwork reviewed. No questions at this time.

## 2015-12-02 NOTE — Discharge Summary (Addendum)
Physician Discharge Summary  Patient ID: Jade Mathis MRN: [id number] DOB/AGE: Sep 12, 1975 40 y.o.  Admit date: 12/01/2015 Discharge date: 12/02/2015  Admission Diagnoses: Incomplete Abortion; Symptomatic Anemia  Discharge Diagnoses:  Active Problems:   Acute blood loss anemia   Discharged Condition: stable  Hospital Course: Pt is a 40 y.o. female. She presented on 12/01/15 for scheduled D&E/suction D&C after being seen in the office on 11/30/15 with clot in uterus and cramping and bleeding s/p known SAB. Pt left MAU 12/01/15 AMA and called back early am requesting procedure which had been previously offered but she declined. After procedure, she began feeling dizzy and lightheaded. She was consented to 2 units of PRBCs, which increased her iron level from 6.6 to 8.8. She is passing gas and having regular BMs. She met all milestones for discharge and was discharged in stable condition on 12/02/15.  Consults: None  Significant Diagnostic Studies: labs: Hbg 8.8 after 2 units PRBCs given. Results for orders placed or performed during the hospital encounter of 12/01/15 (from the past 24 hour(s))  Prepare RBC (crossmatch)     Status: None   Collection Time: 12/01/15 11:00 AM  Result Value Ref Range   Order Confirmation ORDER PROCESSED BY BLOOD BANK   Glucose, capillary     Status: Abnormal   Collection Time: 12/01/15 11:06 AM  Result Value Ref Range   Glucose-Capillary 148 (H) 65 - 99 mg/dL  CBC     Status: Abnormal   Collection Time: 12/01/15 12:05 PM  Result Value Ref Range   WBC 6.6 4.0 - 10.5 K/uL   RBC 2.35 (L) 3.87 - 5.11 MIL/uL   Hemoglobin 6.3 (LL) 12.0 - 15.0 g/dL   HCT 19.4 (L) 36.0 - 46.0 %   MCV 82.6 78.0 - 100.0 fL   MCH 26.8 26.0 - 34.0 pg   MCHC 32.5 30.0 - 36.0 g/dL   RDW 14.1 11.5 - 15.5 %   Platelets 188 150 - 400 K/uL  Glucose, capillary     Status: Abnormal   Collection Time: 12/01/15  2:58 PM  Result Value Ref Range   Glucose-Capillary 115 (H) 65 - 99 mg/dL   Glucose, capillary     Status: Abnormal   Collection Time: 12/01/15  9:23 PM  Result Value Ref Range   Glucose-Capillary 298 (H) 65 - 99 mg/dL  Glucose, capillary     Status: Abnormal   Collection Time: 12/01/15 11:36 PM  Result Value Ref Range   Glucose-Capillary 256 (H) 65 - 99 mg/dL  CBC with Differential/Platelet     Status: Abnormal   Collection Time: 12/02/15  3:53 AM  Result Value Ref Range   WBC 9.3 4.0 - 10.5 K/uL   RBC 3.12 (L) 3.87 - 5.11 MIL/uL   Hemoglobin 8.8 (L) 12.0 - 15.0 g/dL   HCT 26.0 (L) 36.0 - 46.0 %   MCV 83.3 78.0 - 100.0 fL   MCH 28.2 26.0 - 34.0 pg   MCHC 33.8 30.0 - 36.0 g/dL   RDW 14.3 11.5 - 15.5 %   Platelets 198 150 - 400 K/uL   Neutrophils Relative % 83 %   Neutro Abs 7.7 1.7 - 7.7 K/uL   Lymphocytes Relative 13 %   Lymphs Abs 1.2 0.7 - 4.0 K/uL   Monocytes Relative 4 %   Monocytes Absolute 0.3 0.1 - 1.0 K/uL   Eosinophils Relative 0 %   Eosinophils Absolute 0.0 0.0 - 0.7 K/uL   Basophils Relative 0 %   Basophils Absolute 0.0  0.0 - 0.1 K/uL    Treatments: IV hydration, analgesia: Tyl and all home meds.  Discharge Exam: Blood pressure (!) 89/44, pulse 86, temperature 98.6 F (37 C), temperature source Oral, resp. rate 14, height '5\' 6"'$  (1.676 m), weight 106.6 kg (235 lb), last menstrual period 09/09/2015, SpO2 95 %.  Today's Vitals   12/02/15 0000 12/02/15 0102 12/02/15 0402 12/02/15 0536  BP:  110/65  (!) 89/44  Pulse:  96  86  Resp:  18  14  Temp:  98.2 F (36.8 C)  98.6 F (37 C)  TempSrc:  Oral  Oral  SpO2:  100%  95%  Weight:  106.6 kg (235 lb)    Height:  '5\' 6"'$  (1.676 m)    PainSc: 3   Asleep    General appearance: alert, cooperative and no distress Resp: clear to auscultation bilaterally Cardio: regular rate and rhythm, S1, S2 normal, no murmur, click, rub or gallop  Abdomen: soft, NTND, BS present x 4 Vaginal bleeding: minimal Extremities: Trace pedal edema bilaterally  Disposition: 01-Home or Self Care      Medication List    TAKE these medications   acetaminophen-codeine 300-30 MG tablet Commonly known as:  TYLENOL #3 Take 1-2 tablets by mouth every 6 (six) hours as needed for moderate pain.   albuterol 108 (90 Base) MCG/ACT inhaler Commonly known as:  PROVENTIL HFA;VENTOLIN HFA Inhale 2 puffs into the lungs every 6 (six) hours as needed for wheezing or shortness of breath.   albuterol (2.5 MG/3ML) 0.083% nebulizer solution Commonly known as:  PROVENTIL Take 3 mLs (2.5 mg total) by nebulization every 4 (four) hours as needed for wheezing or shortness of breath.   feeding supplement (GLUCERNA SHAKE) Liqd Take 237 mLs by mouth 3 (three) times daily between meals.   fluticasone 110 MCG/ACT inhaler Commonly known as:  FLOVENT HFA Inhale 1 puff into the lungs 2 (two) times daily.   glyBURIDE 2.5 MG tablet Commonly known as:  DIABETA Take 2.5 mg by mouth daily with breakfast.   ibuprofen 600 MG tablet Commonly known as:  ADVIL,MOTRIN Take 1 tablet (600 mg total) by mouth every 6 (six) hours as needed. What changed:  reasons to take this   Insulin Detemir 100 UNIT/ML Pen Commonly known as:  LEVEMIR Inject 15 Units into the skin daily at 10 pm. What changed:  how much to take   levothyroxine 175 MCG tablet Commonly known as:  SYNTHROID, LEVOTHROID Take 1 tablet (175 mcg total) by mouth daily.   metFORMIN 500 MG 24 hr tablet Commonly known as:  GLUCOPHAGE-XR Take 4 tablets (2,000 mg total) by mouth daily with supper.   ondansetron 8 MG disintegrating tablet Commonly known as:  ZOFRAN ODT Take 1 tablet (8 mg total) by mouth every 8 (eight) hours as needed for nausea or vomiting.   oxyCODONE 15 MG immediate release tablet Commonly known as:  ROXICODONE TAKE 1 TABLET BY MOUTH 4 TIMES DAILY   promethazine 25 MG tablet Commonly known as:  PHENERGAN Take 1 tablet (25 mg total) by mouth every 6 (six) hours as needed for nausea or vomiting.   pyridostigmine 60 MG  tablet Commonly known as:  MESTINON Take 1 tablet (60 mg total) by mouth 3 (three) times daily.   ranitidine 150 MG tablet Commonly known as:  ZANTAC Take 1 tablet (150 mg total) by mouth 2 (two) times daily.   valACYclovir 500 MG tablet Commonly known as:  VALTREX Take 500 mg by mouth daily.  Follow-up [facility]. Go on 12/14/2015.   Specialty:  Obstetrics and Gynecology Contact information: [address] [place] [address] [phone]          Signed: Farrel Gordon 12/02/2015, 6:47 AM

## 2015-12-04 LAB — TYPE AND SCREEN
ABO/RH(D): O POS
Antibody Screen: NEGATIVE
Unit division: 0
Unit division: 0
Unit division: 0
Unit division: 0
Unit division: 0

## 2015-12-06 DIAGNOSIS — Z79899 Other long term (current) drug therapy: Secondary | ICD-10-CM | POA: Diagnosis not present

## 2015-12-06 DIAGNOSIS — E119 Type 2 diabetes mellitus without complications: Secondary | ICD-10-CM | POA: Diagnosis not present

## 2015-12-07 ENCOUNTER — Ambulatory Visit (HOSPITAL_COMMUNITY): Payer: Medicare Other | Admitting: Psychiatry

## 2015-12-07 ENCOUNTER — Ambulatory Visit (INDEPENDENT_AMBULATORY_CARE_PROVIDER_SITE_OTHER): Payer: Medicare Other | Admitting: Endocrinology

## 2015-12-07 ENCOUNTER — Encounter: Payer: Self-pay | Admitting: Endocrinology

## 2015-12-07 VITALS — BP 122/85 | HR 90 | Ht 66.0 in | Wt 229.0 lb

## 2015-12-07 DIAGNOSIS — E1165 Type 2 diabetes mellitus with hyperglycemia: Secondary | ICD-10-CM

## 2015-12-07 DIAGNOSIS — G43709 Chronic migraine without aura, not intractable, without status migrainosus: Secondary | ICD-10-CM | POA: Diagnosis not present

## 2015-12-07 DIAGNOSIS — G7000 Myasthenia gravis without (acute) exacerbation: Secondary | ICD-10-CM | POA: Diagnosis not present
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
## 2015-12-07 NOTE — Patient Instructions (Signed)
Stop Glyburide and start Glimeperide 2 mg at dinner  Check blood sugars on waking up  3-4x per week  Also check blood sugars about 2 hours after a meal and do this after different meals by rotation  Recommended blood sugar levels on waking up is 90-130 and about 2 hours after meal is 130-160  Please bring your blood sugar monitor to each visit, thank you  Levemir at supper, reduce by 2 units when am sugar gets below 120   Bydureon 2mg  weekly

## 2015-12-09 ENCOUNTER — Encounter: Payer: Self-pay | Admitting: Obstetrics and Gynecology

## 2015-12-09 ENCOUNTER — Ambulatory Visit: Payer: Medicare Other | Admitting: Obstetrics and Gynecology

## 2015-12-09 DIAGNOSIS — Z8759 Personal history of other complications of pregnancy, childbirth and the puerperium: Secondary | ICD-10-CM

## 2015-12-09 NOTE — Progress Notes (Signed)
GYN Note 12/09/2015  CC: erroneous follow up for medical treatment of   Subjective:    Jade Mathis is a 40 y.o., [id number] here for follow up. Patient seen on 8/30 for management of SAB and elected for medical management and had f/u scheduled here even though she is a [facility] patient. She eventually elected for surgical management and had a 9/13 suction D&C by [facility] and was transfused 2 units and discharge home. Pathology negative  +light spotting and constipation but no fevers, chills, nausea, vomiting or sexual intercourse yet.     Review of Systems Pertinent items are noted in HPI.    Objective:   NAD  Assessment:   Patient doing well s/p suction D&C  Plan:   Patient doing well s/p suction d&c and states she has appointment next week with [facility]. I told her to keep that visit and apologized for the confusion in scheduling.  Advised miralax for constipation  Durene Romans MD Attending Center for [facility])

## 2015-12-13 ENCOUNTER — Ambulatory Visit (INDEPENDENT_AMBULATORY_CARE_PROVIDER_SITE_OTHER): Payer: Medicare Other | Admitting: Neurology

## 2015-12-13 ENCOUNTER — Encounter: Payer: Self-pay | Admitting: Neurology

## 2015-12-13 VITALS — BP 118/80 | HR 108 | Wt 228.1 lb

## 2015-12-13 DIAGNOSIS — Z79899 Other long term (current) drug therapy: Secondary | ICD-10-CM

## 2015-12-13 DIAGNOSIS — G7000 Myasthenia gravis without (acute) exacerbation: Secondary | ICD-10-CM | POA: Diagnosis not present

## 2015-12-13 MED ORDER — PYRIDOSTIGMINE BROMIDE 60 MG PO TABS: 60.0000 mg | ORAL_TABLET | Freq: Three times a day (TID) | ORAL | 3 refills | Status: DC

## 2015-12-13 MED ORDER — AZATHIOPRINE 50 MG PO TABS: 50.0000 mg | ORAL_TABLET | Freq: Every day | ORAL | 3 refills | Status: DC
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
## 2015-12-13 NOTE — Patient Instructions (Addendum)
1.  Please be sure that it is okay to restart your Imuran 150mg  daily from a OB standpoint.  If there are any questions, please call my office at [phone]. 2.  Continue mestinon 60mg  three times daily 3.  Continue prednisone 10mg  daily  Return to clinic in 4 months

## 2015-12-14 DIAGNOSIS — E1100 Type 2 diabetes mellitus with hyperosmolarity without nonketotic hyperglycemic-hyperosmolar coma (NKHHC): Secondary | ICD-10-CM | POA: Diagnosis not present

## 2015-12-14 DIAGNOSIS — D649 Anemia, unspecified: Secondary | ICD-10-CM | POA: Diagnosis not present

## 2015-12-14 DIAGNOSIS — I10 Essential (primary) hypertension: Secondary | ICD-10-CM | POA: Diagnosis not present

## 2015-12-15 ENCOUNTER — Ambulatory Visit: Payer: Medicare Other | Admitting: Dietician

## 2015-12-17 ENCOUNTER — Encounter: Payer: Self-pay | Admitting: Internal Medicine

## 2015-12-17 ENCOUNTER — Ambulatory Visit (INDEPENDENT_AMBULATORY_CARE_PROVIDER_SITE_OTHER): Payer: Medicare Other | Admitting: Internal Medicine

## 2015-12-17 VITALS — BP 102/76 | HR 87 | Temp 98.3°F | Resp 20 | Ht 67.0 in | Wt 228.0 lb

## 2015-12-17 DIAGNOSIS — E669 Obesity, unspecified: Secondary | ICD-10-CM | POA: Diagnosis not present

## 2015-12-17 DIAGNOSIS — K219 Gastro-esophageal reflux disease without esophagitis: Secondary | ICD-10-CM | POA: Diagnosis not present

## 2015-12-17 DIAGNOSIS — E119 Type 2 diabetes mellitus without complications: Secondary | ICD-10-CM | POA: Diagnosis not present

## 2015-12-17 DIAGNOSIS — Z23 Encounter for immunization: Secondary | ICD-10-CM

## 2015-12-17 DIAGNOSIS — E1169 Type 2 diabetes mellitus with other specified complication: Secondary | ICD-10-CM

## 2015-12-17 MED ORDER — OMEPRAZOLE 40 MG PO CPDR
40.0000 mg | DELAYED_RELEASE_CAPSULE | Freq: Every day | ORAL | 6 refills | Status: DC
Start: 2015-12-17 — End: 2016-02-15

## 2015-12-17 MED ORDER — PHENTERMINE HCL 37.5 MG PO TABS: 37.5000 mg | ORAL_TABLET | Freq: Every day | ORAL | 2 refills | Status: DC

## 2015-12-17 NOTE — Progress Notes (Signed)
Pre visit review using our clinic review tool, if applicable. No additional management support is needed unless otherwise documented below in the visit note. 

## 2015-12-17 NOTE — Progress Notes (Signed)
   Subjective:    Patient ID: Jade Mathis, female    DOB: 28-Apr-1975, 40 y.o.   MRN: [id number]  HPI The patient is a 40 YO female coming in for weight loss. She wants to try phentermine as she has heard from friends who are taking it. She also knows that most of the other weight loss medicines are expensive. She is wanting weight loss surgery and they want her to try to lose weight before a surgery. She is also having more problems with her GERD since switching to ranitidine from omeprazole (due to pregnancy, now s/p miscarriage and D &E). She is having more heart burn. She is also having several other issues which we are not adequately able to address. She feels that the metformin caused her to get pregnant and she is concerned about this.   Review of Systems  Constitutional: Positive for activity change and fatigue. Negative for appetite change, chills, fever and unexpected weight change.  HENT: Negative.   Eyes: Negative.   Respiratory: Negative for cough, chest tightness, shortness of breath and wheezing.   Cardiovascular: Negative for chest pain, palpitations and leg swelling.  Gastrointestinal: Positive for abdominal pain. Negative for abdominal distention, constipation, diarrhea, nausea and vomiting.       GERD  Musculoskeletal: Positive for back pain and myalgias. Negative for arthralgias.  Skin: Negative.   Neurological: Positive for weakness. Negative for dizziness, facial asymmetry, light-headedness, numbness and headaches.  Psychiatric/Behavioral: Positive for agitation, decreased concentration and dysphoric mood. Negative for sleep disturbance. The patient is not nervous/anxious.       Objective:   Physical Exam  Constitutional: She is oriented to person, place, and time. She appears well-developed and well-nourished.  Overweight  HENT:  Head: Normocephalic and atraumatic.  Eyes: EOM are normal.  Neck: Normal range of motion.  Cardiovascular: Normal rate and regular rhythm.    Pulmonary/Chest: Effort normal and breath sounds normal. No respiratory distress. She has no wheezes. She has no rales.  Abdominal: Soft. Bowel sounds are normal. She exhibits no distension and no mass. There is tenderness. There is no rebound and no guarding.  Mild tenderness diffusely but no focal point.   Musculoskeletal: She exhibits no edema.  Neurological: She is alert and oriented to person, place, and time. Coordination normal.  Skin: Skin is warm and dry.   Vitals:   12/17/15 1120  BP: 102/76  Pulse: 87  Resp: 20  Temp: 98.3 F (36.8 C)  TempSrc: Oral  SpO2: 98%  Weight: 228 lb (103.4 kg)  Height: 5\' 7"  (1.702 m)      Assessment & Plan:  Pneumo 23 and Flu vaccine given at visit.

## 2015-12-17 NOTE — Assessment & Plan Note (Signed)
Rx for phentermine and needs visit again in 3 months for check. Checked on interaction with her other medications and none listed.

## 2015-12-17 NOTE — Patient Instructions (Addendum)
We have sent in the omeprazole for the stomach and you can stop using the ranitidine.   We have given you the prescription for the phentermine. Take 1 pill daily to help with the weight.   We will see you back in about 3 months to check on the weight. If you get any side effects from the medicine you can call the office. The most common side effects are fast heart rate and nausea.

## 2015-12-17 NOTE — Assessment & Plan Note (Signed)
Omeprazole 40 mg sent in to replace the ranitidine.

## 2015-12-17 NOTE — Assessment & Plan Note (Addendum)
She is concerned that her metformin made her pregnant. We did talk about the fact that metformin has been shown to help with fertility in those with PCOS but independent of unprotected sexual encounters cannot make her pregnant. Given pneumonia and flu shot today. She is encouraged to keep taking the metformin until she can talk to her endocrinologist and she agrees.

## 2015-12-23 ENCOUNTER — Other Ambulatory Visit: Payer: Self-pay | Admitting: Endocrinology

## 2015-12-27 DIAGNOSIS — N644 Mastodynia: Secondary | ICD-10-CM | POA: Diagnosis not present

## 2015-12-29 DIAGNOSIS — M5412 Radiculopathy, cervical region: Secondary | ICD-10-CM | POA: Diagnosis not present

## 2015-12-29 DIAGNOSIS — Z79899 Other long term (current) drug therapy: Secondary | ICD-10-CM | POA: Diagnosis not present

## 2015-12-30 ENCOUNTER — Telehealth: Payer: Self-pay | Admitting: Endocrinology

## 2016-01-02 ENCOUNTER — Other Ambulatory Visit: Payer: Self-pay | Admitting: Endocrinology

## 2016-01-03 ENCOUNTER — Telehealth: Payer: Self-pay

## 2016-01-03 NOTE — Telephone Encounter (Signed)
Spoke to patient and she stated that the pharmacy told her she needed a new prescription- called the pharmacy and they had it ready for her so I called the patient back to let her know she had a prescription ready for pickup- she stated an understanding

## 2016-01-03 NOTE — Telephone Encounter (Signed)
Can you call her please

## 2016-01-03 NOTE — Telephone Encounter (Signed)
You will need to send routine refill requests to [facility].  She will have to talk to her pharmacy about replacing the pen

## 2016-01-03 NOTE — Telephone Encounter (Signed)
Patient stated her Bydureon pen is defective, would like another one and also need a refill,  [id number] - [place], [place] [address] [id number] (Phone) [phone] (Fax)

## 2016-01-03 NOTE — Telephone Encounter (Signed)
Patient is calling on the status of her last message.

## 2016-01-05 ENCOUNTER — Encounter: Payer: Self-pay | Admitting: Endocrinology

## 2016-01-05 ENCOUNTER — Ambulatory Visit (INDEPENDENT_AMBULATORY_CARE_PROVIDER_SITE_OTHER): Payer: Medicare Other | Admitting: Endocrinology

## 2016-01-05 VITALS — BP 130/78 | HR 94 | Temp 98.2°F | Resp 16 | Ht 67.0 in | Wt 227.4 lb

## 2016-01-05 DIAGNOSIS — E890 Postprocedural hypothyroidism: Secondary | ICD-10-CM

## 2016-01-05 DIAGNOSIS — E1165 Type 2 diabetes mellitus with hyperglycemia: Secondary | ICD-10-CM

## 2016-01-05 DIAGNOSIS — E1142 Type 2 diabetes mellitus with diabetic polyneuropathy: Secondary | ICD-10-CM | POA: Diagnosis not present

## 2016-01-05 MED ORDER — GLIMEPIRIDE 4 MG PO TABS: 4.0000 mg | ORAL_TABLET | Freq: Every day | ORAL | 3 refills | Status: DC

## 2016-01-05 MED ORDER — METFORMIN HCL ER 500 MG PO TB24: 2000.0000 mg | ORAL_TABLET | Freq: Every day | ORAL | 3 refills | Status: DC

## 2016-01-05 MED ORDER — LEVOTHYROXINE SODIUM 175 MCG PO TABS: 175.0000 ug | ORAL_TABLET | Freq: Every day | ORAL | 2 refills | Status: DC
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
## 2016-01-05 NOTE — Patient Instructions (Addendum)
Insulin increase to 25 units, down 3 units when sugar stays <100 in am  Check blood sugars on waking up  daily  Also check blood sugars about 2 hours after a meal and do this after different meals by rotation  Recommended blood sugar levels on waking up is 90-130 and about 2 hours after meal is 130-160  Please bring your blood sugar monitor to each visit, thank you  Stop Phenteramine  Glimemeride 4mg  at dinner

## 2016-01-07 ENCOUNTER — Encounter: Payer: Self-pay | Admitting: Dietician

## 2016-01-07 ENCOUNTER — Encounter: Payer: Medicare Other | Attending: Endocrinology | Admitting: Dietician

## 2016-01-07 ENCOUNTER — Other Ambulatory Visit: Payer: Self-pay | Admitting: Neurology

## 2016-01-07 DIAGNOSIS — E1165 Type 2 diabetes mellitus with hyperglycemia: Secondary | ICD-10-CM | POA: Insufficient documentation

## 2016-01-07 DIAGNOSIS — E1169 Type 2 diabetes mellitus with other specified complication: Secondary | ICD-10-CM

## 2016-01-07 DIAGNOSIS — E669 Obesity, unspecified: Secondary | ICD-10-CM

## 2016-01-07 NOTE — Patient Instructions (Signed)
Avoid skipping meals Half your plate should be non starchy vegetables. A quarter of your plate should be protein.  Plan:  Aim for 2-3 Carb Choices per meal (30-45 grams) +/- 1 either way  Aim for 0-1 Carbs per snack if hungry  Include protein in moderation with your meals and snacks Consider reading food labels for Total Carbohydrate and Fat Grams of foods Consider checking BG at alternate times per day as directed by MD  Consider taking medication as directed by MD

## 2016-01-07 NOTE — Progress Notes (Signed)
Diabetes Self-Management Education  Visit Type: First/Initial  Appt. Start Time: 1300 Appt. End Time: 1400  01/07/2016  Jade Mathis, identified by name and date of birth, is a 40 y.o. female with a diagnosis of Diabetes: Type 2.   Hx includes myasthenia gravis, chronic pain, and graves disease.  She had a recent miscarriage.  Diabetes medications include Amaryl, 25 units Levemir daily, and Bydureon.  She has stopped Phentermine as she did not lose weight on this medication.  She was last seen in our office 08/2014.  Her weight was 199 lbs.  She has since increased to 227 lbs 01/05/16.  Highest weight 232 lbs recently and 170 lbs in the past.  She is now planning on Bariatric Surgery and has appointments with the MD regarding this.  She states that she cannot lose weight or insurance will not allow it.  She states that she has lost weight in the past but has always gained it back.  Currently complains that all she wants to do is sleep.  She has been depressed since the miscarriage.  She states that she used to emotional eat but does not now. She was anxious to end the appointment today.  Patient lives with her husband and children ages 71, 76, grandchild, and 45 yo.  She is on disability.    ASSESSMENT  Height 5' 6.5" (1.689 m), weight 227 lb (103 kg), last menstrual period 09/09/2015. Body mass index is 36.09 kg/m.      Diabetes Self-Management Education - 01/07/16 1322      Visit Information   Visit Type First/Initial     Initial Visit   Diabetes Type Type 2   Are you currently following a meal plan? No   Are you taking your medications as prescribed? Yes   Date Diagnosed [place]   How would you rate your overall health? Poor     Psychosocial Assessment   Patient Belief/Attitude about Diabetes Defeat/Burnout   Self-care barriers Unable to determine   Self-management support Doctor's office;Family   Other persons present Patient   Patient Concerns Weight  Control;Nutrition/Meal planning   Special Needs Simplified materials   Preferred Learning Style No preference indicated   Learning Readiness Ready   How often do you need to have someone help you when you read instructions, pamphlets, or other written materials from your doctor or pharmacy? 3 - Sometimes  husband helps   What is the last grade level you completed in school? GED     Pre-Education Assessment   Patient understands the diabetes disease and treatment process. Needs Review   Patient understands incorporating nutritional management into lifestyle. Needs Instruction   Patient undertands incorporating physical activity into lifestyle. Needs Review   Patient understands using medications safely. Demonstrates understanding / competency   Patient understands monitoring blood glucose, interpreting and using results Demonstrates understanding / competency   Patient understands prevention, detection, and treatment of acute complications. Needs Review   Patient understands prevention, detection, and treatment of chronic complications. Needs Review   Patient understands how to develop strategies to address psychosocial issues. Needs Review   Patient understands how to develop strategies to promote health/change behavior. Needs Review     Complications   Last HgB A1C per patient/outside source 6.6 %  09/2015   How often do you check your blood sugar? 1-2 times/day   Fasting Blood glucose range (mg/dL) >200;180-200   Postprandial Blood glucose range (mg/dL) >200   Number of  hypoglycemic episodes per month 0   Number of hyperglycemic episodes per week 21   Can you tell when your blood sugar is high? Yes   What do you do if your blood sugar is high? rests and drinks water   Have you had a dilated eye exam in the past 12 months? Yes   Have you had a dental exam in the past 12 months? Yes   Are you checking your feet? No     Dietary Intake   Breakfast African porage with coffee, milk OR  cereal, (sweet) 1/2 banana, and almond milk, decaf hot tea with Truvia OR oatmeal, rasins, cinnamon sugar, lean [place] sausage   10   Snack (morning) rare   Lunch sleeps through lunch   Snack (afternoon) Belvita cookies with cheese   Dinner CC's pizza (3 slices pizza and chicken wing) OR vegetables, roasted potatoes, meat (often chicken wings or fish)   Snack (evening) occasional cereal (sweet) and milk and nuts   Beverage(s) decaf coffee, decaf hot tea, water, rare regular soda     Exercise   Exercise Type ADL's  usues a cane   How many days per week to you exercise? 0   How many minutes per day do you exercise? 0   Total minutes per week of exercise 0     Patient Education   Previous Diabetes Education Yes (please comment)  15 months ago   Disease state  Definition of diabetes, type 1 and 2, and the diagnosis of diabetes   Nutrition management  Role of diet in the treatment of diabetes and the relationship between the three main macronutrients and blood glucose level;Food label reading, portion sizes and measuring food.;Information on hints to eating out and maintain blood glucose control.;Meal options for control of blood glucose level and chronic complications.   Physical activity and exercise  Role of exercise on diabetes management, blood pressure control and cardiac health.   Monitoring Identified appropriate SMBG and/or A1C goals.;Yearly dilated eye exam;Daily foot exams   Chronic complications Relationship between chronic complications and blood glucose control;Dental care;Retinopathy and reason for yearly dilated eye exams   Psychosocial adjustment Worked with patient to identify barriers to care and solutions;Role of stress on diabetes   Personal strategies to promote health Lifestyle issues that need to be addressed for better diabetes care     Individualized Goals (developed by patient)   Nutrition General guidelines for healthy choices and portions discussed   Medications  take my medication as prescribed   Monitoring  test my blood glucose as discussed   Reducing Risk examine blood glucose patterns   Health Coping discuss diabetes with (comment)  MD/RD     Post-Education Assessment   Patient understands the diabetes disease and treatment process. Demonstrates understanding / competency   Patient understands incorporating nutritional management into lifestyle. Needs Review   Patient undertands incorporating physical activity into lifestyle. Needs Review   Patient understands using medications safely. Demonstrates understanding / competency   Patient understands monitoring blood glucose, interpreting and using results Demonstrates understanding / competency   Patient understands prevention, detection, and treatment of acute complications. Demonstrates understanding / competency   Patient understands prevention, detection, and treatment of chronic complications. Demonstrates understanding / competency   Patient understands how to develop strategies to address psychosocial issues. Demonstrates understanding / competency   Patient understands how to develop strategies to promote health/change behavior. Demonstrates understanding / competency     Outcomes   Expected Outcomes Demonstrated interest  in learning. Expect positive outcomes   Future DMSE PRN   Program Status Completed      Individualized Plan for Diabetes Self-Management Training:   Learning Objective:  Patient will have a greater understanding of diabetes self-management. Patient education plan is to attend individual and/or group sessions per assessed needs and concerns.   Plan:   Patient Instructions  Avoid skipping meals Half your plate should be non starchy vegetables. A quarter of your plate should be protein.  Plan:  Aim for 2-3 Carb Choices per meal (30-45 grams) +/- 1 either way  Aim for 0-1 Carbs per snack if hungry  Include protein in moderation with your meals and  snacks Consider reading food labels for Total Carbohydrate and Fat Grams of foods Consider checking BG at alternate times per day as directed by MD  Consider taking medication as directed by MD    Expected Outcomes:  Demonstrated interest in learning. Expect positive outcomes  Education material provided: Food label handouts, A1C conversion sheet, Meal plan card, My Plate and Snack sheet  If problems or questions, patient to contact team via:  Phone and Email  Future DSME appointment: 3 weeks with Bariatric RD

## 2016-01-10 ENCOUNTER — Telehealth: Payer: Self-pay | Admitting: Emergency Medicine

## 2016-01-10 ENCOUNTER — Telehealth: Payer: Self-pay | Admitting: Neurology

## 2016-01-10 ENCOUNTER — Telehealth: Payer: Self-pay | Admitting: Endocrinology

## 2016-01-10 NOTE — Telephone Encounter (Signed)
Patient  Needs a letter stating she is clear for surgery.for the bariatric clinic. She states that she has four more visits before surgery. Patient will pick up letter if we do that for her please call [phone]

## 2016-01-10 NOTE — Telephone Encounter (Signed)
Pt called in requesting to speak to Suanne Marker, she said that the Bariatric clinic is needing a letter from Dr. Dwyane Dee for clearance.

## 2016-01-10 NOTE — Telephone Encounter (Signed)
Pt called and needs a letter for baratric surgery. She wants to know if she needs to make an appt for this since she discussed it with you at her last appt. Please advise thanks.

## 2016-01-11 NOTE — Telephone Encounter (Signed)
She have the surgery scheduled, if so, what is the date?  Is there a particular surgical clearance form that needs to be completed or will letter suffice?  Donika K. Posey Pronto, DO

## 2016-01-11 NOTE — Telephone Encounter (Signed)
Spoke with patient and informed her that we do not have the form she is requesting from the bariatric clinic yet. Will give her a call back when we do and she stated an understanding.

## 2016-01-11 NOTE — Telephone Encounter (Signed)
Did you know about this surgery?

## 2016-01-11 NOTE — Telephone Encounter (Signed)
Patient is on phentermine since September. Dr. Sharlet Salina asked for patient to come in for a 3 month follow up on weight loss in December. Patient aware.

## 2016-01-13 DIAGNOSIS — I10 Essential (primary) hypertension: Secondary | ICD-10-CM | POA: Diagnosis not present

## 2016-01-13 DIAGNOSIS — E119 Type 2 diabetes mellitus without complications: Secondary | ICD-10-CM | POA: Diagnosis not present

## 2016-01-13 DIAGNOSIS — Z7952 Long term (current) use of systemic steroids: Secondary | ICD-10-CM | POA: Diagnosis not present

## 2016-01-21 NOTE — Telephone Encounter (Signed)
Left patient a message to call me back and let me know if she needs a form or letter.

## 2016-01-25 DIAGNOSIS — M5030 Other cervical disc degeneration, unspecified cervical region: Secondary | ICD-10-CM | POA: Diagnosis not present

## 2016-01-25 DIAGNOSIS — J45909 Unspecified asthma, uncomplicated: Secondary | ICD-10-CM | POA: Diagnosis not present

## 2016-01-25 DIAGNOSIS — E119 Type 2 diabetes mellitus without complications: Secondary | ICD-10-CM | POA: Diagnosis not present

## 2016-01-25 DIAGNOSIS — G7000 Myasthenia gravis without (acute) exacerbation: Secondary | ICD-10-CM | POA: Diagnosis not present

## 2016-01-25 DIAGNOSIS — Z713 Dietary counseling and surveillance: Secondary | ICD-10-CM | POA: Diagnosis not present

## 2016-01-25 DIAGNOSIS — G43709 Chronic migraine without aura, not intractable, without status migrainosus: Secondary | ICD-10-CM | POA: Diagnosis not present

## 2016-01-27 DIAGNOSIS — Z79899 Other long term (current) drug therapy: Secondary | ICD-10-CM | POA: Diagnosis not present

## 2016-01-27 DIAGNOSIS — M5412 Radiculopathy, cervical region: Secondary | ICD-10-CM | POA: Diagnosis not present

## 2016-01-30 ENCOUNTER — Other Ambulatory Visit: Payer: Self-pay | Admitting: Internal Medicine

## 2016-01-31 ENCOUNTER — Other Ambulatory Visit: Payer: Self-pay | Admitting: Endocrinology

## 2016-01-31 ENCOUNTER — Ambulatory Visit: Payer: Medicare Other | Admitting: Dietician

## 2016-02-01 ENCOUNTER — Telehealth: Payer: Self-pay | Admitting: *Deleted

## 2016-02-01 DIAGNOSIS — N925 Other specified irregular menstruation: Secondary | ICD-10-CM | POA: Diagnosis not present

## 2016-02-01 NOTE — Telephone Encounter (Signed)
Pt left message yesterday stating that she missed her period and wants to know what she should do next.

## 2016-02-03 DIAGNOSIS — N644 Mastodynia: Secondary | ICD-10-CM | POA: Diagnosis not present

## 2016-02-04 ENCOUNTER — Ambulatory Visit (INDEPENDENT_AMBULATORY_CARE_PROVIDER_SITE_OTHER): Payer: Medicare Other | Admitting: *Deleted

## 2016-02-04 ENCOUNTER — Encounter: Payer: Self-pay | Admitting: *Deleted

## 2016-02-04 DIAGNOSIS — Z3490 Encounter for supervision of normal pregnancy, unspecified, unspecified trimester: Secondary | ICD-10-CM

## 2016-02-04 DIAGNOSIS — Z3201 Encounter for pregnancy test, result positive: Secondary | ICD-10-CM

## 2016-02-04 DIAGNOSIS — R52 Pain, unspecified: Secondary | ICD-10-CM | POA: Diagnosis not present

## 2016-02-04 LAB — POCT PREGNANCY, URINE: Preg Test, Ur: POSITIVE — AB

## 2016-02-04 MED ORDER — PRENATAL VITAMINS 0.8 MG PO TABS
1.0000 | ORAL_TABLET | Freq: Every day | ORAL | 12 refills | Status: DC
Start: 2016-02-04 — End: 2016-05-29

## 2016-02-04 NOTE — Progress Notes (Signed)
Patient presents to clinic for pregnancy test which is positive. Reviewed allergies and medications, recommended she contact her providers to determine what meds to stop and what can continue. She verbalized that she is already starting that process. Patient would like to start [facility] here, as she is diabetic and recently had a miscarriage. Prescription for PNV sent to her pharmacy of choice. Pregnancy verification letter given. Patient to schedule appointment for initial prenatal visit.

## 2016-02-07 ENCOUNTER — Inpatient Hospital Stay (HOSPITAL_COMMUNITY): Payer: Medicare Other

## 2016-02-07 ENCOUNTER — Inpatient Hospital Stay (HOSPITAL_COMMUNITY)
Admission: AD | Admit: 2016-02-07 | Discharge: 2016-02-07 | Disposition: A | Discharge status: Home / Self Care | Payer: Medicare Other | Source: Ambulatory Visit | Attending: Family Medicine | Admitting: Family Medicine

## 2016-02-07 ENCOUNTER — Encounter (HOSPITAL_COMMUNITY): Payer: Self-pay | Admitting: *Deleted

## 2016-02-07 DIAGNOSIS — Z3A01 Less than 8 weeks gestation of pregnancy: Secondary | ICD-10-CM | POA: Diagnosis not present

## 2016-02-07 DIAGNOSIS — Z3490 Encounter for supervision of normal pregnancy, unspecified, unspecified trimester: Secondary | ICD-10-CM

## 2016-02-07 DIAGNOSIS — O3680X0 Pregnancy with inconclusive fetal viability, not applicable or unspecified: Secondary | ICD-10-CM

## 2016-02-07 DIAGNOSIS — O26891 Other specified pregnancy related conditions, first trimester: Secondary | ICD-10-CM | POA: Diagnosis not present

## 2016-02-07 DIAGNOSIS — R102 Pelvic and perineal pain: Secondary | ICD-10-CM | POA: Insufficient documentation

## 2016-02-07 DIAGNOSIS — R51 Headache: Secondary | ICD-10-CM | POA: Diagnosis not present

## 2016-02-07 DIAGNOSIS — R1030 Lower abdominal pain, unspecified: Secondary | ICD-10-CM | POA: Diagnosis not present

## 2016-02-07 LAB — CBC
HCT: 35.9 % — ABNORMAL LOW (ref 36.0–46.0)
Hemoglobin: 11.7 g/dL — ABNORMAL LOW (ref 12.0–15.0)
MCH: 26.2 pg (ref 26.0–34.0)
MCHC: 32.6 g/dL (ref 30.0–36.0)
MCV: 80.3 fL (ref 78.0–100.0)
Platelets: 258 10*3/uL (ref 150–400)
RBC: 4.47 MIL/uL (ref 3.87–5.11)
RDW: 13 % (ref 11.5–15.5)
WBC: 8.6 10*3/uL (ref 4.0–10.5)

## 2016-02-07 LAB — URINE MICROSCOPIC-ADD ON

## 2016-02-07 LAB — URINALYSIS, ROUTINE W REFLEX MICROSCOPIC
Bilirubin Urine: NEGATIVE
Glucose, UA: >1000 mg/dL — AB
Hgb urine dipstick: NEGATIVE
Ketones, ur: NEGATIVE mg/dL
Leukocytes, UA: NEGATIVE
Nitrite: NEGATIVE
Protein, ur: NEGATIVE mg/dL
Specific Gravity, Urine: 1.01 (ref 1.005–1.030)
pH: 6 (ref 5.0–8.0)

## 2016-02-07 LAB — WET PREP, GENITAL
Clue Cells Wet Prep HPF POC: NONE SEEN
Sperm: NONE SEEN
Trich, Wet Prep: NONE SEEN
Yeast Wet Prep HPF POC: NONE SEEN

## 2016-02-07 LAB — HCG, QUANTITATIVE, PREGNANCY: hCG, Beta Chain, Quant, S: 2066 m[IU]/mL — ABNORMAL HIGH (ref <5)
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
## 2016-02-07 NOTE — Discharge Instructions (Signed)
Ectopic Pregnancy °An ectopic pregnancy is when the fertilized egg attaches (implants) outside the uterus. Most ectopic pregnancies occur in one of the tubes where eggs travel from the ovary to the uterus (fallopian tubes), but the implanting can occur in other locations. In rare cases, ectopic pregnancies occur on the ovary, intestine, pelvis, abdomen, or cervix. In an ectopic pregnancy, the fertilized egg does not have the ability to develop into a normal, healthy baby. °A ruptured ectopic pregnancy is one in which tearing or bursting of a fallopian tube causes internal bleeding. Often, there is intense lower abdominal pain, and vaginal bleeding sometimes occurs. Having an ectopic pregnancy can be life-threatening. If this dangerous condition is not treated, it can lead to blood loss, shock, or even death. °What are the causes? °The most common cause of this condition is damage to one of the fallopian tubes. A fallopian tube may be narrowed or blocked, and that keeps the fertilized egg from reaching the uterus. °What increases the risk? °This condition is more likely to develop in women of childbearing age who have different levels of risk. The levels of risk can be divided into three categories. °High risk  °· You have gone through infertility treatment. °· You have had an ectopic pregnancy before. °· You have had surgery on the fallopian tubes, or another surgical procedure, such as an abortion. °· You have had surgery to have the fallopian tubes tied (tubal ligation). °· You have problems or diseases of the fallopian tubes. °· You have been exposed to diethylstilbestrol (DES). This medicine was used until 1971, and it had effects on babies whose mothers took the medicine. °· You become pregnant while using an IUD (intrauterine device) for birth control. °Moderate risk  °· You have a history of infertility. °· You have had an STI (sexually transmitted infection). °· You have a history of pelvic inflammatory  disease (PID). °· You have scarring from endometriosis. °· You have multiple sexual partners. °· You smoke. °Low risk  °· You have had pelvic surgery. °· You use vaginal douches. °· You became sexually active before age 18. °What are the signs or symptoms? °Common symptoms of this condition include normal pregnancy symptoms, such as missing a period, nausea, tiredness, abdominal pain, breast tenderness, and bleeding. However, ectopic pregnancy will have additional symptoms, such as: °· Pain with intercourse. °· Irregular vaginal bleeding or spotting. °· Cramping or pain on one side or in the lower abdomen. °· Fast heartbeat, low blood pressure, and sweating. °· Passing out while having a bowel movement. °Symptoms of a ruptured ectopic pregnancy and internal bleeding may include: °· Sudden, severe pain in the abdomen and pelvis. °· Dizziness, weakness, light-headedness, or fainting. °· Pain in the shoulder or neck area. °How is this diagnosed? °This condition is diagnosed by: °· A pelvic exam to locate pain or a mass in the abdomen. °· A pregnancy test. This blood test checks for the presence as well as the specific level of pregnancy hormone in the bloodstream. °· Ultrasound. This is performed if a pregnancy test is positive. In this test, a probe is inserted into the vagina. The probe will detect a fetus, possibly in a location other than the uterus. °· Taking a sample of uterus tissue (dilation and curettage, or D&C). °· Surgery to perform a visual exam of the inside of the abdomen using a thin, lighted tube that has a tiny camera on the end (laparoscope). °· Culdocentesis. This procedure involves inserting a needle at the   top of the vagina, behind the uterus. If blood is present in this area, it may indicate that a fallopian tube is torn. °How is this treated? °This condition is treated with medicine or surgery. °Medicine  °· An injection of a medicine (methotrexate) may be given to cause the pregnancy tissue to  be absorbed. This medicine may save your fallopian tube. It may be given if: °¨ The diagnosis is made early, with no signs of active bleeding. °¨ The fallopian tube has not ruptured. °¨ You are considered to be a good candidate for the medicine. °Usually, pregnancy hormone blood levels are checked after methotrexate treatment. This is to be sure that the medicine is effective. It may take 4-6 weeks for the pregnancy to be absorbed. Most pregnancies will be absorbed by 3 weeks. °Surgery  °· A laparoscope may be used to remove the pregnancy tissue. °· If severe internal bleeding occurs, a larger cut (incision) may be made in the lower abdomen (laparotomy) to remove the fetus and placenta. This is done to stop the bleeding. °· Part or all of the fallopian tube may be removed (salpingectomy) along with the fetus and placenta. The fallopian tube may also be repaired during the surgery. °· In very rare circumstances, removal of the uterus (hysterectomy) may be required. °· After surgery, pregnancy hormone testing may be done to be sure that there is no pregnancy tissue left. °Whether your treatment is medicine or surgery, you may receive a Rho (D) immune globulin shot to prevent problems with any future pregnancy. This shot may be given if: °· You are Rh-negative and the baby's father is Rh-positive. °· You are Rh-negative and you do not know the Rh type of the baby's father. °Follow these instructions at home: °· Rest and limit your activity after the procedure for as long as told by your health care provider. °· Until your health care provider says that it is safe: °¨ Do not lift anything that is heavier than 10 lb (4.5 kg), or the limit that your health care provider tells you. °¨ Avoid physical exercise and any movement that requires effort (is strenuous). °· To help prevent constipation: °¨ Eat a healthy diet that includes fruits, vegetables, and whole grains. °¨ Drink 6-8 glasses of water per day. °Get help right  away if: °· You develop worsening pain that is not relieved by medicine. °· You have: °¨ A fever or chills. °¨ Vaginal bleeding. °¨ Redness and swelling at the incision site. °¨ Nausea and vomiting. °· You feel dizzy or weak. °· You feel light-headed or you faint. °This information is not intended to replace advice given to you by your health care provider. Make sure you discuss any questions you have with your health care provider. °Document Released: 04/13/2004 Document Revised: 11/03/2015 Document Reviewed: 10/06/2015 °Elsevier Interactive Patient Education © 2017 [employer] Inc. ° °

## 2016-02-07 NOTE — MAU Note (Signed)
Patient states she is Jade Mathis pregnant and having bilateral lower abdominal cramping described as "contractions" since Friday.  Also complaining of achy legs, headache, and nausea.  Denies vaginal bleeding or discharge.

## 2016-02-08 ENCOUNTER — Encounter: Payer: Self-pay | Admitting: Obstetrics & Gynecology

## 2016-02-08 DIAGNOSIS — R52 Pain, unspecified: Secondary | ICD-10-CM | POA: Diagnosis not present

## 2016-02-08 LAB — GC/CHLAMYDIA PROBE AMP (~~LOC~~) NOT AT ARMC
Chlamydia: NEGATIVE
Neisseria Gonorrhea: NEGATIVE

## 2016-02-08 LAB — HIV ANTIBODY (ROUTINE TESTING W REFLEX): HIV Screen 4th Generation wRfx: NONREACTIVE

## 2016-02-08 LAB — RPR: RPR Ser Ql: NONREACTIVE

## 2016-02-09 ENCOUNTER — Encounter: Payer: Self-pay | Admitting: Obstetrics & Gynecology

## 2016-02-09 ENCOUNTER — Ambulatory Visit (INDEPENDENT_AMBULATORY_CARE_PROVIDER_SITE_OTHER): Payer: Medicare Other | Admitting: Clinical

## 2016-02-09 ENCOUNTER — Ambulatory Visit (INDEPENDENT_AMBULATORY_CARE_PROVIDER_SITE_OTHER): Payer: Medicare Other | Admitting: Obstetrics and Gynecology

## 2016-02-09 ENCOUNTER — Other Ambulatory Visit: Payer: Medicare Other

## 2016-02-09 VITALS — BP 102/68 | HR 80 | Ht 66.0 in | Wt 231.0 lb

## 2016-02-09 DIAGNOSIS — F430 Acute stress reaction: Secondary | ICD-10-CM | POA: Diagnosis not present

## 2016-02-09 DIAGNOSIS — Z3491 Encounter for supervision of normal pregnancy, unspecified, first trimester: Secondary | ICD-10-CM

## 2016-02-09 DIAGNOSIS — O3680X0 Pregnancy with inconclusive fetal viability, not applicable or unspecified: Secondary | ICD-10-CM

## 2016-02-09 LAB — HCG, QUANTITATIVE, PREGNANCY: hCG, Beta Chain, Quant, S: 4193 m[IU]/mL — ABNORMAL HIGH (ref <5)
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
## 2016-02-09 NOTE — Progress Notes (Signed)
Per Dr Baron Sane, patient's levels have risen appropriately & patient needs follow up ultrasound in 10-14 days to confirm viability. Appt made for 12/5 @ 9am. Patient informed of appt and results. Patient is aware of brief office visit to follow with a nurse. Patient verbalized understanding to all

## 2016-02-11 ENCOUNTER — Encounter: Payer: Self-pay | Admitting: Endocrinology

## 2016-02-11 ENCOUNTER — Encounter: Payer: Self-pay | Admitting: Internal Medicine

## 2016-02-11 ENCOUNTER — Encounter: Payer: Self-pay | Admitting: Neurology

## 2016-02-11 ENCOUNTER — Encounter: Payer: Self-pay | Admitting: Obstetrics and Gynecology

## 2016-02-11 ENCOUNTER — Encounter: Payer: Self-pay | Admitting: Obstetrics & Gynecology

## 2016-02-14 ENCOUNTER — Other Ambulatory Visit: Payer: Self-pay | Admitting: Neurology

## 2016-02-14 ENCOUNTER — Other Ambulatory Visit: Payer: Self-pay

## 2016-02-14 ENCOUNTER — Other Ambulatory Visit (INDEPENDENT_AMBULATORY_CARE_PROVIDER_SITE_OTHER): Payer: Medicare Other

## 2016-02-14 ENCOUNTER — Telehealth: Payer: Self-pay | Admitting: Endocrinology

## 2016-02-14 DIAGNOSIS — E890 Postprocedural hypothyroidism: Secondary | ICD-10-CM | POA: Diagnosis not present

## 2016-02-14 DIAGNOSIS — E1165 Type 2 diabetes mellitus with hyperglycemia: Secondary | ICD-10-CM | POA: Diagnosis not present

## 2016-02-14 LAB — COMPREHENSIVE METABOLIC PANEL
ALT: 8 U/L (ref 0–35)
AST: 11 U/L (ref 0–37)
Albumin: 3.7 g/dL (ref 3.5–5.2)
Alkaline Phosphatase: 72 U/L (ref 39–117)
BUN: 14 mg/dL (ref 6–23)
CO2: 26 mEq/L (ref 19–32)
Calcium: 9 mg/dL (ref 8.4–10.5)
Chloride: 97 mEq/L (ref 96–112)
Creatinine, Ser: 0.69 mg/dL (ref 0.40–1.20)
GFR: 120.59 mL/min (ref >60.00)
Glucose, Bld: 242 mg/dL — ABNORMAL HIGH (ref 70–99)
Potassium: 4 mEq/L (ref 3.5–5.1)
Sodium: 130 mEq/L — ABNORMAL LOW (ref 135–145)
Total Bilirubin: 0.3 mg/dL (ref 0.2–1.2)
Total Protein: 7.2 g/dL (ref 6.0–8.3)

## 2016-02-14 LAB — HEMOGLOBIN A1C: Hgb A1c MFr Bld: 7.5 % — ABNORMAL HIGH (ref 4.6–6.5)

## 2016-02-14 LAB — TSH: TSH: 1.89 u[IU]/mL (ref 0.35–4.50)

## 2016-02-14 MED ORDER — ACCU-CHEK SOFTCLIX LANCETS MISC: 12 refills | Status: DC

## 2016-02-14 NOTE — Telephone Encounter (Signed)
Patient is retuning your call call [phone]

## 2016-02-14 NOTE — Telephone Encounter (Signed)
Patient is pregnant again and would like to know what to do, she stated her doctor wants her to take the HCG, she states it hurts every time she goes, please advise

## 2016-02-15 ENCOUNTER — Telehealth: Payer: Self-pay | Admitting: Clinical

## 2016-02-15 NOTE — Telephone Encounter (Signed)
Pt called, inquiring about resources available to obtain maternity clothes. Pt agrees to come in on 02-22-16, after her medical appointment, to meet with Geary, MSW, LCSWA, to discuss resource options as well as f/u mood check.

## 2016-02-15 NOTE — Telephone Encounter (Signed)
Spoke with patient and she understands that she needs to go to her OBGYN for this test

## 2016-02-16 ENCOUNTER — Encounter: Payer: Self-pay | Admitting: Endocrinology

## 2016-02-16 ENCOUNTER — Other Ambulatory Visit: Payer: Self-pay

## 2016-02-16 ENCOUNTER — Ambulatory Visit (INDEPENDENT_AMBULATORY_CARE_PROVIDER_SITE_OTHER): Payer: Medicare Other | Admitting: Endocrinology

## 2016-02-16 VITALS — BP 103/71 | HR 94 | Wt 231.0 lb

## 2016-02-16 DIAGNOSIS — E1165 Type 2 diabetes mellitus with hyperglycemia: Secondary | ICD-10-CM

## 2016-02-16 DIAGNOSIS — E038 Other specified hypothyroidism: Secondary | ICD-10-CM

## 2016-02-16 DIAGNOSIS — E063 Autoimmune thyroiditis: Secondary | ICD-10-CM

## 2016-02-16 DIAGNOSIS — Z794 Long term (current) use of insulin: Secondary | ICD-10-CM

## 2016-02-16 MED ORDER — INSULIN PEN NEEDLE 31G X 8 MM MISC: 5 refills | Status: DC

## 2016-02-16 MED ORDER — INSULIN ASPART 100 UNIT/ML FLEXPEN: PEN_INJECTOR | SUBCUTANEOUS | 5 refills | Status: DC

## 2016-02-16 MED ORDER — INSULIN DETEMIR 100 UNIT/ML FLEXPEN: PEN_INJECTOR | SUBCUTANEOUS | 11 refills | Status: DC

## 2016-02-16 MED ORDER — ACCU-CHEK SOFTCLIX LANCETS MISC: 5 refills | Status: DC

## 2016-02-16 MED ORDER — GLUCOSE BLOOD VI STRP: ORAL_STRIP | 5 refills | Status: DC
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
## 2016-02-16 NOTE — Patient Instructions (Addendum)
Check sugar before each meal and bedtime  LEVEMIR 10 IN AM AND 36 AT NITE at bedtime  Every 3-4 days if the morning sugar is still over 110 increase the dose by 2 units at night Also call if having any blood sugars below 60    NOVOLOG: This is a fast acting insulin to take before each meal as follows:  6 units at BREAKFAST and lunch and 8 at [facility]  If the blood sugar is over 120 take extra NovoLog with each meal as follows: Glucose 120-150 = 1 unit 150-180 = 2 units 180-220 = 4 units Over 220 = 6 units

## 2016-02-17 ENCOUNTER — Other Ambulatory Visit: Payer: Self-pay

## 2016-02-17 MED ORDER — INSULIN LISPRO 100 UNIT/ML ~~LOC~~ SOLN: SUBCUTANEOUS | 5 refills | Status: DC

## 2016-02-18 ENCOUNTER — Telehealth: Payer: Self-pay | Admitting: Clinical

## 2016-02-18 NOTE — Telephone Encounter (Signed)
Returned pt call, inquiring about community resources available for obtaining maternity clothes. Pt informed that there are no known resources available for free maternity clothing, but that [facility] consignment and area thrift stores may have low cost maternity clothing available. Pt also inquiring about [employer] program, and is encouraged to contact [facility] at [phone]. Pt says she will call Alma Friendly, and that she will come into [place] on Tuesday, 02-22-16.

## 2016-02-19 ENCOUNTER — Inpatient Hospital Stay (HOSPITAL_COMMUNITY)
Admission: AD | Admit: 2016-02-19 | Discharge: 2016-02-19 | Disposition: A | Discharge status: Home / Self Care | Payer: Medicare Other | Source: Ambulatory Visit | Attending: Obstetrics & Gynecology | Admitting: Obstetrics & Gynecology

## 2016-02-19 DIAGNOSIS — R109 Unspecified abdominal pain: Secondary | ICD-10-CM | POA: Diagnosis not present

## 2016-02-19 DIAGNOSIS — R51 Headache: Secondary | ICD-10-CM | POA: Diagnosis not present

## 2016-02-19 DIAGNOSIS — E119 Type 2 diabetes mellitus without complications: Secondary | ICD-10-CM | POA: Insufficient documentation

## 2016-02-19 DIAGNOSIS — O24311 Unspecified pre-existing diabetes mellitus in pregnancy, first trimester: Secondary | ICD-10-CM | POA: Diagnosis not present

## 2016-02-19 DIAGNOSIS — Z3A01 Less than 8 weeks gestation of pregnancy: Secondary | ICD-10-CM | POA: Insufficient documentation

## 2016-02-19 DIAGNOSIS — O26891 Other specified pregnancy related conditions, first trimester: Secondary | ICD-10-CM

## 2016-02-19 LAB — WET PREP, GENITAL
Clue Cells Wet Prep HPF POC: NONE SEEN
Sperm: NONE SEEN
Trich, Wet Prep: NONE SEEN
Yeast Wet Prep HPF POC: NONE SEEN

## 2016-02-19 LAB — URINALYSIS, ROUTINE W REFLEX MICROSCOPIC
Bilirubin Urine: NEGATIVE
Glucose, UA: 100 mg/dL — AB
Hgb urine dipstick: NEGATIVE
Ketones, ur: NEGATIVE mg/dL
Leukocytes, UA: NEGATIVE
Nitrite: NEGATIVE
Protein, ur: NEGATIVE mg/dL
Specific Gravity, Urine: 1.015 (ref 1.005–1.030)
pH: 7.5 (ref 5.0–8.0)

## 2016-02-19 MED ORDER — BUTALBITAL-APAP-CAFFEINE 50-325-40 MG PO TABS
1.0000 | ORAL_TABLET | Freq: Once | ORAL | Status: AC
  Administered 2016-02-19: 1 via ORAL
  Filled 2016-02-19: qty 1
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
## 2016-02-19 NOTE — Discharge Instructions (Signed)
Abdominal Pain During Pregnancy °Belly (abdominal) pain is common during pregnancy. Most of the time, it is not a serious problem. Other times, it can be a sign that something is wrong with the pregnancy. Always tell your doctor if you have belly pain. °Follow these instructions at home: °Monitor your belly pain for any changes. The following actions may help you feel better: °· Do not have sex (intercourse) or put anything in your vagina until you feel better. °· Rest until your pain stops. °· Drink clear fluids if you feel sick to your stomach (nauseous). Do not eat solid food until you feel better. °· Only take medicine as told by your doctor. °· Keep all doctor visits as told. °Get help right away if: °· You are bleeding, leaking fluid, or pieces of tissue come out of your vagina. °· You have more pain or cramping. °· You keep throwing up (vomiting). °· You have pain when you pee (urinate) or have blood in your pee. °· You have a fever. °· You do not feel your baby moving as much. °· You feel very weak or feel like passing out. °· You have trouble breathing, with or without belly pain. °· You have a very bad headache and belly pain. °· You have fluid leaking from your vagina and belly pain. °· You keep having watery poop (diarrhea). °· Your belly pain does not go away after resting, or the pain gets worse. °This information is not intended to replace advice given to you by your health care provider. Make sure you discuss any questions you have with your health care provider. °Document Released: 02/22/2009 Document Revised: 10/13/2015 Document Reviewed: 10/03/2012 °Elsevier Interactive Patient Education © 2017 [employer] ° °

## 2016-02-19 NOTE — MAU Note (Addendum)
Pt having lower abdominal pain 8/10. No bleeding, some discharge. Pt is diabetic.

## 2016-02-21 ENCOUNTER — Other Ambulatory Visit: Payer: Medicare Other

## 2016-02-21 LAB — GC/CHLAMYDIA PROBE AMP (~~LOC~~) NOT AT ARMC
Chlamydia: NEGATIVE
Neisseria Gonorrhea: NEGATIVE

## 2016-02-22 ENCOUNTER — Ambulatory Visit: Payer: Medicare Other | Admitting: *Deleted

## 2016-02-22 ENCOUNTER — Ambulatory Visit (HOSPITAL_COMMUNITY): Payer: Medicare Other

## 2016-02-22 ENCOUNTER — Ambulatory Visit (HOSPITAL_COMMUNITY)
Admission: RE | Admit: 2016-02-22 | Discharge: 2016-02-22 | Disposition: A | Discharge status: Home / Self Care | Payer: Medicare Other | Source: Ambulatory Visit | Attending: Obstetrics and Gynecology | Admitting: Obstetrics and Gynecology

## 2016-02-22 DIAGNOSIS — Z3A01 Less than 8 weeks gestation of pregnancy: Secondary | ICD-10-CM | POA: Insufficient documentation

## 2016-02-22 DIAGNOSIS — O283 Abnormal ultrasonic finding on antenatal screening of mother: Secondary | ICD-10-CM | POA: Diagnosis not present

## 2016-02-22 DIAGNOSIS — Z712 Person consulting for explanation of examination or test findings: Secondary | ICD-10-CM

## 2016-02-22 DIAGNOSIS — O3680X0 Pregnancy with inconclusive fetal viability, not applicable or unspecified: Secondary | ICD-10-CM

## 2016-02-22 NOTE — Progress Notes (Signed)
Patient presents to clinic for u/s results. After reviewing with Dr Baron Sane, spoke with patient and let her know results are good and she should make an initial prenatal visit. Pt stated she already has one scheduled. Patient given pictures.

## 2016-02-23 ENCOUNTER — Encounter: Payer: Self-pay | Admitting: Nutrition

## 2016-02-23 DIAGNOSIS — Z79899 Other long term (current) drug therapy: Secondary | ICD-10-CM | POA: Diagnosis not present

## 2016-02-23 DIAGNOSIS — M5412 Radiculopathy, cervical region: Secondary | ICD-10-CM | POA: Diagnosis not present

## 2016-02-25 ENCOUNTER — Other Ambulatory Visit: Payer: Self-pay | Admitting: Internal Medicine

## 2016-02-25 ENCOUNTER — Encounter: Payer: Self-pay | Admitting: Endocrinology

## 2016-02-25 ENCOUNTER — Ambulatory Visit (INDEPENDENT_AMBULATORY_CARE_PROVIDER_SITE_OTHER): Payer: Medicare Other | Admitting: Endocrinology

## 2016-02-25 VITALS — BP 120/80 | HR 81 | Wt 235.0 lb

## 2016-02-25 DIAGNOSIS — E1165 Type 2 diabetes mellitus with hyperglycemia: Secondary | ICD-10-CM | POA: Diagnosis not present

## 2016-02-25 DIAGNOSIS — Z794 Long term (current) use of insulin: Secondary | ICD-10-CM

## 2016-02-25 NOTE — Patient Instructions (Addendum)
Take 1 metformin twice daily  No cereal in am  Check blood sugars on waking up daily   Also check blood sugars BEFORE EACH MEAL about 2 hours after a meal and do this after AT LEAST 1 meal  by rotation  Recommended blood sugar levels on waking up is 90-130 and about 2 hours after meal is 130-160  Please bring your blood sugar monitor to each visit, thank you  LEVEMIR 42 IN PM AND 14 IN AM  NOVOLOG 12 WITH MEALS  EXTRA NOVOLOG FOR HI SUGARS  Sugar 120-150: add 2            150-200 add 4  200-250 add 6  > 250 add 8
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
## 2016-02-28 ENCOUNTER — Encounter: Payer: Medicare Other | Attending: Endocrinology | Admitting: Dietician

## 2016-02-28 ENCOUNTER — Ambulatory Visit: Payer: Medicare Other | Admitting: *Deleted

## 2016-02-28 DIAGNOSIS — E1165 Type 2 diabetes mellitus with hyperglycemia: Secondary | ICD-10-CM | POA: Diagnosis not present

## 2016-02-28 DIAGNOSIS — O24119 Pre-existing diabetes mellitus, type 2, in pregnancy, unspecified trimester: Secondary | ICD-10-CM

## 2016-02-28 NOTE — Progress Notes (Signed)
Diabetes Education:  02/28/16 Jade Mathis is a 40 yr old lady with a hx of DM type 2 following the birth of her 87 year old son.  Currently seeing Dr. Dwyane Dee for her diabetes management.Has 3 children, ages 9, 87 and 64 years.   Current DM medications: Metformin, 500 mg AM and PM Humalog 12 units before each meal. Levemir 14 units in Am and 42 units in the PM. Did not bring her meter or glucose log to her appointment. Completed review of the the glucose goals in pregnancy and the monitoring times.  Noted MD's will work with Dr. Dwyane Dee in adjusting medication to assist with attaining appropriate glucose levels.  Requested that she do 1 week of pre and 2 hr post meal blood glucose levels, record and bring to clinic.   Completed review of the implications of diabetes upon both the mother and the baby during pregnancy. Review of the recommended GDM dietary recommendations, carbs and carb counting.  Encouraged that she work toward regular meals and snacks. Review of the need for regular exercise and offered suggestions that could be used indoors during our colder weather. Will plan to follow as needed. Maggie May, RN, RD, LDN

## 2016-03-01 ENCOUNTER — Ambulatory Visit: Payer: Self-pay | Admitting: Endocrinology

## 2016-03-01 DIAGNOSIS — E119 Type 2 diabetes mellitus without complications: Secondary | ICD-10-CM | POA: Diagnosis not present

## 2016-03-01 DIAGNOSIS — N912 Amenorrhea, unspecified: Secondary | ICD-10-CM | POA: Diagnosis not present

## 2016-03-04 ENCOUNTER — Inpatient Hospital Stay (HOSPITAL_COMMUNITY)
Admission: AD | Admit: 2016-03-04 | Discharge: 2016-03-04 | Disposition: A | Discharge status: Home / Self Care | Payer: Medicare Other | Source: Ambulatory Visit | Attending: Obstetrics and Gynecology | Admitting: Obstetrics and Gynecology

## 2016-03-04 ENCOUNTER — Encounter (HOSPITAL_COMMUNITY): Payer: Self-pay

## 2016-03-04 ENCOUNTER — Inpatient Hospital Stay (HOSPITAL_COMMUNITY): Payer: Medicare Other

## 2016-03-04 DIAGNOSIS — O26891 Other specified pregnancy related conditions, first trimester: Secondary | ICD-10-CM | POA: Diagnosis not present

## 2016-03-04 DIAGNOSIS — O26851 Spotting complicating pregnancy, first trimester: Secondary | ICD-10-CM | POA: Diagnosis present

## 2016-03-04 DIAGNOSIS — R109 Unspecified abdominal pain: Secondary | ICD-10-CM | POA: Diagnosis not present

## 2016-03-04 DIAGNOSIS — Z794 Long term (current) use of insulin: Secondary | ICD-10-CM | POA: Insufficient documentation

## 2016-03-04 DIAGNOSIS — E119 Type 2 diabetes mellitus without complications: Secondary | ICD-10-CM | POA: Insufficient documentation

## 2016-03-04 DIAGNOSIS — R51 Headache: Secondary | ICD-10-CM | POA: Diagnosis not present

## 2016-03-04 DIAGNOSIS — O24111 Pre-existing diabetes mellitus, type 2, in pregnancy, first trimester: Secondary | ICD-10-CM | POA: Insufficient documentation

## 2016-03-04 DIAGNOSIS — O209 Hemorrhage in early pregnancy, unspecified: Secondary | ICD-10-CM | POA: Insufficient documentation

## 2016-03-04 DIAGNOSIS — Z3A09 9 weeks gestation of pregnancy: Secondary | ICD-10-CM | POA: Diagnosis not present

## 2016-03-04 LAB — URINALYSIS, ROUTINE W REFLEX MICROSCOPIC
Bacteria, UA: NONE SEEN
Bilirubin Urine: NEGATIVE
Glucose, UA: 50 mg/dL — AB
Ketones, ur: NEGATIVE mg/dL
Leukocytes, UA: NEGATIVE
Nitrite: NEGATIVE
Protein, ur: NEGATIVE mg/dL
Specific Gravity, Urine: 1.024 (ref 1.005–1.030)
pH: 5 (ref 5.0–8.0)

## 2016-03-04 LAB — GLUCOSE, CAPILLARY: Glucose-Capillary: 152 mg/dL — ABNORMAL HIGH (ref 65–99)

## 2016-03-04 LAB — CBC
HCT: 34.7 % — ABNORMAL LOW (ref 36.0–46.0)
Hemoglobin: 11.5 g/dL — ABNORMAL LOW (ref 12.0–15.0)
MCH: 26.1 pg (ref 26.0–34.0)
MCHC: 33.1 g/dL (ref 30.0–36.0)
MCV: 78.7 fL (ref 78.0–100.0)
Platelets: 241 10*3/uL (ref 150–400)
RBC: 4.41 MIL/uL (ref 3.87–5.11)
RDW: 14.1 % (ref 11.5–15.5)
WBC: 7.2 10*3/uL (ref 4.0–10.5)

## 2016-03-04 MED ORDER — BUTALBITAL-APAP-CAFFEINE 50-325-40 MG PO TABS
1.0000 | ORAL_TABLET | Freq: Once | ORAL | Status: AC
  Administered 2016-03-04: 1 via ORAL
  Filled 2016-03-04: qty 1
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
## 2016-03-04 NOTE — MAU Note (Signed)
Woke up at 4 am with low abd cramps and vag bleeding only when wiped. When woke up having headache also.  Has first appt with Dr Terri Piedra on This Tues.

## 2016-03-04 NOTE — Discharge Instructions (Signed)
First Trimester of Pregnancy The first trimester of pregnancy is from week 1 until the end of week 12 (months 1 through 3). During this time, your baby will begin to develop inside you. At 6-8 weeks, the eyes and face are formed, and the heartbeat can be seen on ultrasound. At the end of 12 weeks, all the baby's organs are formed. Prenatal care is all the medical care you receive before the birth of your baby. Make sure you get good prenatal care and follow all of your doctor's instructions. Follow these instructions at home: Medicines  Take medicine only as told by your doctor. Some medicines are safe and some are not during pregnancy.  Take your prenatal vitamins as told by your doctor.  Take medicine that helps you poop (stool softener) as needed if your doctor says it is okay. Diet  Eat regular, healthy meals.  Your doctor will tell you the amount of weight gain that is right for you.  Avoid raw meat and uncooked cheese.  If you feel sick to your stomach (nauseous) or throw up (vomit):  Eat 4 or 5 small meals a day instead of 3 large meals.  Try eating a few soda crackers.  Drink liquids between meals instead of during meals.  If you have a hard time pooping (constipation):  Eat high-fiber foods like fresh vegetables, fruit, and whole grains.  Drink enough fluids to keep your pee (urine) clear or pale yellow. Activity and Exercise  Exercise only as told by your doctor. Stop exercising if you have cramps or pain in your lower belly (abdomen) or low back.  Try to avoid standing for long periods of time. Move your legs often if you must stand in one place for a long time.  Avoid heavy lifting.  Wear low-heeled shoes. Sit and stand up straight.  You can have sex unless your doctor tells you not to. Relief of Pain or Discomfort  Wear a good support bra if your breasts are sore.  Take warm water baths (sitz baths) to soothe pain or discomfort caused by hemorrhoids. Use  hemorrhoid cream if your doctor says it is okay.  Rest with your legs raised if you have leg cramps or low back pain.  Wear support hose if you have puffy, bulging veins (varicose veins) in your legs. Raise (elevate) your feet for 15 minutes, 3-4 times a day. Limit salt in your diet. Prenatal Care  Schedule your prenatal visits by the twelfth week of pregnancy.  Write down your questions. Take them to your prenatal visits.  Keep all your prenatal visits as told by your doctor. Safety  Wear your seat belt at all times when driving.  Make a list of emergency phone numbers. The list should include numbers for family, friends, the hospital, and police and fire departments. General Tips  Ask your doctor for a referral to a local prenatal class. Begin classes no later than at the start of month 6 of your pregnancy.  Ask for help if you need counseling or help with nutrition. Your doctor can give you advice or tell you where to go for help.  Do not use hot tubs, steam rooms, or saunas.  Do not douche or use tampons or scented sanitary pads.  Do not cross your legs for long periods of time.  Avoid litter boxes and soil used by cats.  Avoid all smoking, herbs, and alcohol. Avoid drugs not approved by your doctor.  Do not use any tobacco products,  including cigarettes, chewing tobacco, and electronic cigarettes. If you need help quitting, ask your doctor. You may get counseling or other support to help you quit.  Visit your dentist. At home, brush your teeth with a soft toothbrush. Be gentle when you floss. Get help if:  You are dizzy.  You have mild cramps or pressure in your lower belly.  You have a nagging pain in your belly area.  You continue to feel sick to your stomach, throw up, or have watery poop (diarrhea).  You have a bad smelling fluid coming from your vagina.  You have pain with peeing (urination).  You have increased puffiness (swelling) in your face, hands,  legs, or ankles. Get help right away if:  You have a fever.  You are leaking fluid from your vagina.  You have spotting or bleeding from your vagina.  You have very bad belly cramping or pain.  You gain or lose weight rapidly.  You throw up blood. It may look like coffee grounds.  You are around people who have [place] measles, fifth disease, or chickenpox.  You have a very bad headache.  You have shortness of breath.  You have any kind of trauma, such as from a fall or a car accident. This information is not intended to replace advice given to you by your health care provider. Make sure you discuss any questions you have with your health care provider. Document Released: 08/23/2007 Document Revised: 08/12/2015 Document Reviewed: 01/14/2013 Elsevier Interactive Patient Education  2017 [employer].

## 2016-03-06 ENCOUNTER — Encounter: Payer: Self-pay | Admitting: Obstetrics & Gynecology

## 2016-03-06 ENCOUNTER — Ambulatory Visit (INDEPENDENT_AMBULATORY_CARE_PROVIDER_SITE_OTHER): Payer: Medicare Other | Admitting: Obstetrics & Gynecology

## 2016-03-06 VITALS — BP 115/77 | HR 68 | Wt 238.5 lb

## 2016-03-06 DIAGNOSIS — F329 Major depressive disorder, single episode, unspecified: Secondary | ICD-10-CM | POA: Insufficient documentation

## 2016-03-06 DIAGNOSIS — O219 Vomiting of pregnancy, unspecified: Secondary | ICD-10-CM

## 2016-03-06 DIAGNOSIS — O99351 Diseases of the nervous system complicating pregnancy, first trimester: Secondary | ICD-10-CM

## 2016-03-06 DIAGNOSIS — O24311 Unspecified pre-existing diabetes mellitus in pregnancy, first trimester: Secondary | ICD-10-CM | POA: Diagnosis not present

## 2016-03-06 DIAGNOSIS — O09521 Supervision of elderly multigravida, first trimester: Secondary | ICD-10-CM

## 2016-03-06 DIAGNOSIS — O99350 Diseases of the nervous system complicating pregnancy, unspecified trimester: Secondary | ICD-10-CM

## 2016-03-06 DIAGNOSIS — O24319 Unspecified pre-existing diabetes mellitus in pregnancy, unspecified trimester: Secondary | ICD-10-CM | POA: Insufficient documentation

## 2016-03-06 DIAGNOSIS — O99340 Other mental disorders complicating pregnancy, unspecified trimester: Secondary | ICD-10-CM

## 2016-03-06 DIAGNOSIS — E039 Hypothyroidism, unspecified: Secondary | ICD-10-CM

## 2016-03-06 DIAGNOSIS — F32A Depression, unspecified: Secondary | ICD-10-CM | POA: Insufficient documentation

## 2016-03-06 DIAGNOSIS — N390 Urinary tract infection, site not specified: Secondary | ICD-10-CM | POA: Diagnosis not present

## 2016-03-06 DIAGNOSIS — G7000 Myasthenia gravis without (acute) exacerbation: Secondary | ICD-10-CM

## 2016-03-06 DIAGNOSIS — E349 Endocrine disorder, unspecified: Secondary | ICD-10-CM | POA: Diagnosis not present

## 2016-03-06 DIAGNOSIS — O99280 Endocrine, nutritional and metabolic diseases complicating pregnancy, unspecified trimester: Secondary | ICD-10-CM

## 2016-03-06 LAB — POCT URINALYSIS DIP (DEVICE)
Bilirubin Urine: NEGATIVE
Bilirubin Urine: NEGATIVE
Bilirubin Urine: NEGATIVE
Glucose, UA: 100 mg/dL — AB
Glucose, UA: 100 mg/dL — AB
Glucose, UA: NEGATIVE mg/dL
Hgb urine dipstick: NEGATIVE
Ketones, ur: NEGATIVE mg/dL
Ketones, ur: NEGATIVE mg/dL
Leukocytes, UA: NEGATIVE
Leukocytes, UA: NEGATIVE
Nitrite: NEGATIVE
Nitrite: NEGATIVE
Nitrite: NEGATIVE
Protein, ur: NEGATIVE mg/dL
Protein, ur: NEGATIVE mg/dL
Protein, ur: NEGATIVE mg/dL
Specific Gravity, Urine: 1.025 (ref 1.005–1.030)
Specific Gravity, Urine: 1.025 (ref 1.005–1.030)
Specific Gravity, Urine: >=1.03 (ref 1.005–1.030)
Urobilinogen, UA: 1 mg/dL (ref 0.0–1.0)
Urobilinogen, UA: 0.2 mg/dL (ref 0.0–1.0)
Urobilinogen, UA: 1 mg/dL (ref 0.0–1.0)
pH: 5.5 (ref 5.0–8.0)
pH: 5.5 (ref 5.0–8.0)
pH: 6 (ref 5.0–8.0)

## 2016-03-06 LAB — TSH: TSH: 2.04 mIU/L

## 2016-03-06 MED ORDER — INSULIN ASPART 100 UNIT/ML FLEXPEN: 15.0000 [IU] | PEN_INJECTOR | Freq: Three times a day (TID) | SUBCUTANEOUS | 6 refills | Status: DC

## 2016-03-06 MED ORDER — DOXYLAMINE-PYRIDOXINE 10-10 MG PO TBEC: 2.0000 | DELAYED_RELEASE_TABLET | Freq: Every day | ORAL | 5 refills | Status: DC

## 2016-03-06 MED ORDER — PROMETHAZINE HCL 25 MG PO TABS: 25.0000 mg | ORAL_TABLET | Freq: Four times a day (QID) | ORAL | 2 refills | Status: DC | PRN

## 2016-03-06 MED ORDER — INSULIN DETEMIR 100 UNIT/ML FLEXPEN: SUBCUTANEOUS | 5 refills | Status: DC

## 2016-03-06 NOTE — Patient Instructions (Signed)
Return to clinic for any scheduled appointments or obstetric concerns, or go to MAU for evaluation  

## 2016-03-06 NOTE — Progress Notes (Signed)
Vaginal bleeding/cramping Sat and Sunday, no bleeding today
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
## 2016-03-07 ENCOUNTER — Telehealth: Payer: Self-pay | Admitting: *Deleted

## 2016-03-07 LAB — PRENATAL PROFILE (SOLSTAS)
Antibody Screen: NEGATIVE
Basophils Absolute: 0 cells/uL (ref 0–200)
Basophils Relative: 0 %
Eosinophils Absolute: 189 cells/uL (ref 15–500)
Eosinophils Relative: 3 %
HCT: 34.9 % — ABNORMAL LOW (ref 35.0–45.0)
HIV 1&2 Ab, 4th Generation: NONREACTIVE
Hemoglobin: 11 g/dL — ABNORMAL LOW (ref 11.7–15.5)
Hepatitis B Surface Ag: NEGATIVE
Lymphocytes Relative: 26 %
Lymphs Abs: 1638 cells/uL (ref 850–3900)
MCH: 25.6 pg — ABNORMAL LOW (ref 27.0–33.0)
MCHC: 31.5 g/dL — ABNORMAL LOW (ref 32.0–36.0)
MCV: 81.2 fL (ref 80.0–100.0)
MPV: 9.2 fL (ref 7.5–12.5)
Monocytes Absolute: 441 cells/uL (ref 200–950)
Monocytes Relative: 7 %
Neutro Abs: 4032 cells/uL (ref 1500–7800)
Neutrophils Relative %: 64 %
Platelets: 252 10*3/uL (ref 140–400)
RBC: 4.3 MIL/uL (ref 3.80–5.10)
RDW: 14.1 % (ref 11.0–15.0)
Rh Type: POSITIVE
Rubella: 4.63 Index — ABNORMAL HIGH (ref <0.90)
WBC: 6.3 10*3/uL (ref 3.8–10.8)

## 2016-03-07 LAB — COMPREHENSIVE METABOLIC PANEL
ALT: 11 U/L (ref 6–29)
AST: 13 U/L (ref 10–30)
Albumin: 3.4 g/dL — ABNORMAL LOW (ref 3.6–5.1)
Alkaline Phosphatase: 54 U/L (ref 33–115)
BUN: 11 mg/dL (ref 7–25)
CO2: 24 mmol/L (ref 20–31)
Calcium: 8.8 mg/dL (ref 8.6–10.2)
Chloride: 104 mmol/L (ref 98–110)
Creat: 0.51 mg/dL (ref 0.50–1.10)
Glucose, Bld: 172 mg/dL — ABNORMAL HIGH (ref 65–99)
Potassium: 4.3 mmol/L (ref 3.5–5.3)
Sodium: 137 mmol/L (ref 135–146)
Total Bilirubin: 0.3 mg/dL (ref 0.2–1.2)
Total Protein: 6.6 g/dL (ref 6.1–8.1)

## 2016-03-07 LAB — PROTEIN / CREATININE RATIO, URINE
Creatinine, Urine: 194 mg/dL (ref 20–320)
Protein Creatinine Ratio: 98 mg/g creat (ref 21–161)
Total Protein, Urine: 19 mg/dL (ref 5–24)

## 2016-03-07 NOTE — Telephone Encounter (Signed)
Jade Mathis called front office and call transferred to nurse. She states she was seen yesterday and Dr. Harolyn Rutherford changed her insulin and she doesn't remember what dose she changed it to and doesn't see it on her notes. I reviewed chart and informed her of new insulin orders. She voices understanding.

## 2016-03-08 LAB — CULTURE, OB URINE

## 2016-03-08 LAB — HEMOGLOBINOPATHY EVALUATION
HCT: 35.6 % (ref 35.0–45.0)
Hemoglobin: 11.1 g/dL — ABNORMAL LOW (ref 11.7–15.5)
Hgb A2 Quant: 2.3 % (ref 1.8–3.5)
Hgb A: 96.7 % (ref >96.0)
Hgb F Quant: <1 % (ref <2.0)
MCH: 25.5 pg — ABNORMAL LOW (ref 27.0–33.0)
MCV: 81.8 fL (ref 80.0–100.0)
RBC: 4.35 MIL/uL (ref 3.80–5.10)
RDW: 14.1 % (ref 11.0–15.0)

## 2016-03-10 LAB — PAIN MGMT, PROFILE 6 CONF W/O MM, U
6 Acetylmorphine: NEGATIVE ng/mL (ref <10)
Alcohol Metabolites: NEGATIVE ng/mL (ref <500)
Amobarbital: NEGATIVE ng/mL (ref <100)
Amphetamines: NEGATIVE ng/mL (ref <500)
Barbiturates: POSITIVE ng/mL — AB (ref <300)
Benzodiazepines: NEGATIVE ng/mL (ref <100)
Butalbital: 269 ng/mL — ABNORMAL HIGH (ref <100)
Cocaine Metabolite: NEGATIVE ng/mL (ref <150)
Creatinine: 158.6 mg/dL (ref >=20.0)
Marijuana Metabolite: NEGATIVE ng/mL (ref <20)
Methadone Metabolite: NEGATIVE ng/mL (ref <100)
Opiates: NEGATIVE ng/mL (ref <100)
Oxidant: NEGATIVE ug/mL (ref <200)
Oxycodone: NEGATIVE ng/mL (ref <100)
Pentobarbital: NEGATIVE ng/mL (ref <100)
Phencyclidine: NEGATIVE ng/mL (ref <25)
Phenobarbital: NEGATIVE ng/mL (ref <100)
Please note:: 0
Secobarbital: NEGATIVE ng/mL (ref <100)
pH: 6.45 (ref 4.5–9.0)

## 2016-03-15 ENCOUNTER — Telehealth: Payer: Self-pay | Admitting: *Deleted

## 2016-03-15 ENCOUNTER — Telehealth: Payer: Self-pay | Admitting: Endocrinology

## 2016-03-15 DIAGNOSIS — E1169 Type 2 diabetes mellitus with other specified complication: Secondary | ICD-10-CM

## 2016-03-15 DIAGNOSIS — G894 Chronic pain syndrome: Secondary | ICD-10-CM

## 2016-03-15 DIAGNOSIS — O09529 Supervision of elderly multigravida, unspecified trimester: Secondary | ICD-10-CM

## 2016-03-15 DIAGNOSIS — O24319 Unspecified pre-existing diabetes mellitus in pregnancy, unspecified trimester: Secondary | ICD-10-CM

## 2016-03-15 DIAGNOSIS — G7000 Myasthenia gravis without (acute) exacerbation: Secondary | ICD-10-CM

## 2016-03-15 DIAGNOSIS — E669 Obesity, unspecified: Secondary | ICD-10-CM

## 2016-03-15 MED ORDER — INSULIN DETEMIR 100 UNIT/ML FLEXPEN: SUBCUTANEOUS | 5 refills | Status: DC

## 2016-03-15 NOTE — Telephone Encounter (Signed)
Yes, OK to send a new Rx reflecting this dose.

## 2016-03-15 NOTE — Telephone Encounter (Signed)
Patient left a message with call a nurse. Wants someone to call her back about resutls.

## 2016-03-15 NOTE — Telephone Encounter (Signed)
See message. Please advise if we could submit this dosage during Dr. Ronnie Derby absence? Thanks!

## 2016-03-15 NOTE — Telephone Encounter (Signed)
Rx submitted

## 2016-03-15 NOTE — Telephone Encounter (Signed)
Pt called in and said that her OB has changed her dosing of Levemir to 15U AM and 45U PM, she needs a new prescription for this amount sent to the [facility].

## 2016-03-16 ENCOUNTER — Encounter (HOSPITAL_COMMUNITY): Payer: Self-pay | Admitting: Obstetrics & Gynecology

## 2016-03-16 DIAGNOSIS — R300 Dysuria: Secondary | ICD-10-CM | POA: Diagnosis not present

## 2016-03-17 ENCOUNTER — Encounter: Payer: Self-pay | Admitting: Internal Medicine

## 2016-03-17 ENCOUNTER — Ambulatory Visit (INDEPENDENT_AMBULATORY_CARE_PROVIDER_SITE_OTHER): Payer: Medicare Other | Admitting: Internal Medicine

## 2016-03-17 DIAGNOSIS — J4530 Mild persistent asthma, uncomplicated: Secondary | ICD-10-CM | POA: Diagnosis not present

## 2016-03-17 MED ORDER — FLUTICASONE PROPIONATE HFA 110 MCG/ACT IN AERO: 1.0000 | INHALATION_SPRAY | Freq: Two times a day (BID) | RESPIRATORY_TRACT | 12 refills | Status: DC

## 2016-03-17 MED ORDER — ALBUTEROL SULFATE (2.5 MG/3ML) 0.083% IN NEBU: 2.5000 mg | INHALATION_SOLUTION | RESPIRATORY_TRACT | 6 refills | Status: DC | PRN

## 2016-03-17 NOTE — Assessment & Plan Note (Signed)
Taking flovent and no flare. Using albuterol as needed for SOB. Did talk about the fact that as her pregnancy progresses she will likely get some more SOB due to size.

## 2016-03-17 NOTE — Progress Notes (Signed)
   Subjective:    Patient ID: Jade Mathis, female    DOB: 04-05-1975, 40 y.o.   MRN: [id number]  HPI The patient is a 40 YO female coming in for follow up of her breathing. She is using her flovent and doing well. Occasionally using the albuterol nebulizer solution. She denies flare since the last visit. She is still following with high risk ob. Doing some better with her blood sugars and no lows. Regimen adjusted at last OB visit. No new concerns. Very nauseous and has not been able to keep down her opioid medication and is using tylenol instead which is helpful as long as she keeps it in her system. She knows not to take NSAIDs.   Review of Systems  Constitutional: Positive for activity change, appetite change and fatigue. Negative for chills, fever and unexpected weight change.  Respiratory: Negative for cough, chest tightness, shortness of breath and wheezing.   Cardiovascular: Negative.   Gastrointestinal: Positive for abdominal pain and nausea. Negative for abdominal distention, constipation and diarrhea.  Musculoskeletal: Positive for arthralgias and myalgias. Negative for back pain, gait problem and joint swelling.  Skin: Negative.       Objective:   Physical Exam  Constitutional: She is oriented to person, place, and time. She appears well-developed and well-nourished.  Overweight  HENT:  Head: Normocephalic and atraumatic.  Eyes: EOM are normal.  Neck: Normal range of motion.  Cardiovascular: Normal rate and regular rhythm.   Pulmonary/Chest: Effort normal and breath sounds normal. No respiratory distress. She has no wheezes. She has no rales.  Abdominal: Soft. She exhibits no distension. There is no tenderness. There is no rebound.  Neurological: She is alert and oriented to person, place, and time. Coordination normal.  Skin: Skin is warm and dry.   Vitals:   03/17/16 0805  BP: 120/68  Pulse: 94  Resp: 16  Temp: 98 F (36.7 C)  TempSrc: Oral  SpO2: 100%  Weight: 239  lb 8 oz (108.6 kg)  Height: 5\' 6"  (1.676 m)      Assessment & Plan:

## 2016-03-17 NOTE — Progress Notes (Signed)
Pre visit review using our clinic review tool, if applicable. No additional management support is needed unless otherwise documented below in the visit note. 

## 2016-03-17 NOTE — Patient Instructions (Signed)
We do not need blood work today.   We have sent in the refills of the breathing medicines for your today.   Good luck with things and the fatigue will pass soon.

## 2016-03-18 ENCOUNTER — Other Ambulatory Visit: Payer: Self-pay | Admitting: Endocrinology

## 2016-03-18 ENCOUNTER — Other Ambulatory Visit: Payer: Self-pay | Admitting: Internal Medicine

## 2016-03-20 NOTE — L&D Delivery Note (Signed)
Patient is a 41 y/o G6 now P4 who was having some heart rate decelerations through out the day. She was progressing and we continued to monitor. She was complete and we attempted pushing with little movement. She was noted to be OP and rotation was attempted, but was not successful. She was placed on the peanut to labor down. I was called to the room for a fetal heart rate of 220 with variable decelerations. Infant was then at 3+ attempted pushing with little progress. Vaccuum placed. She did develop a fever to 102F just prior to delivery.  Operative Delivery Note At 12:20 PM a viable female was delivered via Vaginal, Vacuum Neurosurgeon).  Presentation: vertex; Position: Occiput,, Anterior; Station: +3.  Verbal consent: obtained from patient.  Risks and benefits discussed in detail.  Risks include, but are not limited to the risks of anesthesia, bleeding, infection, damage to maternal tissues, fetal cephalhematoma.  There is also the risk of inability to effect vaginal delivery of the head, or shoulder dystocia that cannot be resolved by established maneuvers, leading to the need for emergency cesarean section.  APGAR: pending.   Placenta status: delivered intact with gentle traction, .   Cord: 3 vessel  with the following complications: nuchal x1  Cord pH: sent and pending  Anesthesia:  epidural Instruments: Kiwi Vaccum Episiotomy: None Lacerations: None Est. Blood Loss (mL): 150  Mom to postpartum.  Baby to NICU.  Jade Mathis 09/23/2016, 12:33 PM

## 2016-03-21 ENCOUNTER — Ambulatory Visit (INDEPENDENT_AMBULATORY_CARE_PROVIDER_SITE_OTHER): Payer: Medicare Other | Admitting: Obstetrics & Gynecology

## 2016-03-21 VITALS — BP 123/76 | HR 99 | Wt 237.4 lb

## 2016-03-21 DIAGNOSIS — O09529 Supervision of elderly multigravida, unspecified trimester: Secondary | ICD-10-CM | POA: Insufficient documentation

## 2016-03-21 DIAGNOSIS — O24319 Unspecified pre-existing diabetes mellitus in pregnancy, unspecified trimester: Secondary | ICD-10-CM

## 2016-03-21 DIAGNOSIS — O99341 Other mental disorders complicating pregnancy, first trimester: Secondary | ICD-10-CM

## 2016-03-21 DIAGNOSIS — O24311 Unspecified pre-existing diabetes mellitus in pregnancy, first trimester: Secondary | ICD-10-CM

## 2016-03-21 DIAGNOSIS — F32A Depression, unspecified: Secondary | ICD-10-CM

## 2016-03-21 DIAGNOSIS — F329 Major depressive disorder, single episode, unspecified: Secondary | ICD-10-CM

## 2016-03-21 DIAGNOSIS — O0990 Supervision of high risk pregnancy, unspecified, unspecified trimester: Secondary | ICD-10-CM | POA: Insufficient documentation

## 2016-03-21 DIAGNOSIS — O99340 Other mental disorders complicating pregnancy, unspecified trimester: Secondary | ICD-10-CM

## 2016-03-21 LAB — POCT URINALYSIS DIP (DEVICE)
Bilirubin Urine: NEGATIVE
Glucose, UA: NEGATIVE mg/dL
Hgb urine dipstick: NEGATIVE
Ketones, ur: NEGATIVE mg/dL
Leukocytes, UA: NEGATIVE
Nitrite: NEGATIVE
Protein, ur: NEGATIVE mg/dL
Specific Gravity, Urine: 1.02 (ref 1.005–1.030)
Urobilinogen, UA: 1 mg/dL (ref 0.0–1.0)
pH: 6 (ref 5.0–8.0)

## 2016-03-21 MED ORDER — INSULIN DETEMIR 100 UNIT/ML FLEXPEN: SUBCUTANEOUS | 5 refills | Status: DC

## 2016-03-21 NOTE — Progress Notes (Signed)
   PRENATAL VISIT NOTE  Subjective:  Jade Mathis is a 41 y.o. [phone] at [redacted]w[redacted]d being seen today for ongoing prenatal care.  She is currently monitored for the following issues for this high-risk pregnancy and has HYPOTHYROIDISM, POSTSURGICAL; Diabetes mellitus type 2 in obese ([place]); OBESITY, NOS; DEPRESSION, MAJOR, RECURRENT, MODERATE; INSOMNIA, CHRONIC; MIGRAINE HEADACHE; Myasthenia gravis ([place]); RHINITIS, ALLERGIC; Asthma; GERD; BACK PAIN; OSA (obstructive sleep apnea); Pure hypercholesterolemia; Chronic pain syndrome; Stool incontinence; Ventral hernia; Abdominal pain; Acute blood loss anemia; Preexisting diabetes complicating pregnancy, antepartum; Myasthenia gravis complicating pregnancy ([place]); Depression affecting pregnancy, antepartum; Hypothyroidism affecting pregnancy, antepartum; AMA (advanced maternal age) multigravida 35+; and Supervision of high-risk pregnancy on her problem list.  Patient reports nausea, vomiting and can't get Diclegis due to insurance.  Contractions: Not present. Vag. Bleeding: None.  Movement: Present. Denies leaking of fluid.   The following portions of the patient's history were reviewed and updated as appropriate: allergies, current medications, past family history, past medical history, past social history, past surgical history and problem list. Problem list updated.  Objective:   Vitals:   03/21/16 1503  BP: 123/76  Pulse: 99  Weight: 237 lb 6.4 oz (107.7 kg)    Fetal Status: Fetal Heart Rate (bpm): 160   Movement: Present     General:  Alert, oriented and cooperative. Patient is in no acute distress.  Skin: Skin is warm and dry. No rash noted.   Cardiovascular: Normal heart rate noted  Respiratory: Normal respiratory effort, no problems with respiration noted  Abdomen: Soft, gravid, appropriate for gestational age. Pain/Pressure: Present     Pelvic:  Cervical exam deferred        Extremities: Normal range of motion.  Edema: Trace  Mental Status:  Normal mood and affect. Normal behavior. Normal judgment and thought content.   Assessment and Plan:  Pregnancy: [id number] at [redacted]w[redacted]d  1. Preexisting diabetes complicating pregnancy, antepartum FBS 130-150's, increase PM Levemir to 50 units - insulin detemir (LEVEMIR) 100 unit/ml SOLN; 15 units in the morning and 50 units at bedtime.  Dispense: 1 vial; Refill: 5  2. Depression affecting pregnancy, antepartum To see counselor today  3. Supervision of high risk pregnancy, antepartum AMA, first screen is next week  Preterm labor symptoms and general obstetric precautions including but not limited to vaginal bleeding, contractions, leaking of fluid and fetal movement were reviewed in detail with the patient. Please refer to After Visit Summary for other counseling recommendations.  Return in about 2 weeks (around 04/04/2016).   [place] Mode, MD

## 2016-03-21 NOTE — Telephone Encounter (Signed)
I called Jade Mathis and she states she wants to have the panorama test when she comes in today for her ob visit.  I explained that is not done in our office, but is done in [facility] and I will see if I can add it to her appt there next week and we an discuss at her visit later today. She voices understanding.

## 2016-03-22 NOTE — Progress Notes (Signed)
Addendum:  03/23/15/18  Completed Diclegis request

## 2016-03-24 ENCOUNTER — Other Ambulatory Visit: Payer: Self-pay

## 2016-03-24 MED ORDER — ACCU-CHEK AVIVA PLUS VI STRP: 1.0000 | ORAL_STRIP | Freq: Three times a day (TID) | 3 refills | Status: DC

## 2016-03-24 MED ORDER — ACCU-CHEK SOFTCLIX LANCETS MISC: 1.0000 | Freq: Three times a day (TID) | 0 refills | Status: DC

## 2016-03-27 ENCOUNTER — Inpatient Hospital Stay (HOSPITAL_COMMUNITY)
Admission: AD | Admit: 2016-03-27 | Discharge: 2016-03-27 | Disposition: A | Discharge status: Home / Self Care | Payer: Medicare Other | Source: Ambulatory Visit | Attending: Family Medicine | Admitting: Family Medicine

## 2016-03-27 ENCOUNTER — Encounter (HOSPITAL_COMMUNITY): Payer: Self-pay

## 2016-03-27 DIAGNOSIS — O99350 Diseases of the nervous system complicating pregnancy, unspecified trimester: Secondary | ICD-10-CM

## 2016-03-27 DIAGNOSIS — O99281 Endocrine, nutritional and metabolic diseases complicating pregnancy, first trimester: Secondary | ICD-10-CM | POA: Insufficient documentation

## 2016-03-27 DIAGNOSIS — M5412 Radiculopathy, cervical region: Secondary | ICD-10-CM | POA: Diagnosis not present

## 2016-03-27 DIAGNOSIS — O24311 Unspecified pre-existing diabetes mellitus in pregnancy, first trimester: Secondary | ICD-10-CM | POA: Diagnosis not present

## 2016-03-27 DIAGNOSIS — E039 Hypothyroidism, unspecified: Secondary | ICD-10-CM

## 2016-03-27 DIAGNOSIS — Z3A12 12 weeks gestation of pregnancy: Secondary | ICD-10-CM | POA: Insufficient documentation

## 2016-03-27 DIAGNOSIS — J069 Acute upper respiratory infection, unspecified: Secondary | ICD-10-CM | POA: Diagnosis not present

## 2016-03-27 DIAGNOSIS — O0990 Supervision of high risk pregnancy, unspecified, unspecified trimester: Secondary | ICD-10-CM

## 2016-03-27 DIAGNOSIS — H9209 Otalgia, unspecified ear: Secondary | ICD-10-CM | POA: Diagnosis not present

## 2016-03-27 DIAGNOSIS — R1084 Generalized abdominal pain: Secondary | ICD-10-CM | POA: Insufficient documentation

## 2016-03-27 DIAGNOSIS — O0991 Supervision of high risk pregnancy, unspecified, first trimester: Secondary | ICD-10-CM | POA: Insufficient documentation

## 2016-03-27 DIAGNOSIS — O26891 Other specified pregnancy related conditions, first trimester: Secondary | ICD-10-CM | POA: Diagnosis not present

## 2016-03-27 DIAGNOSIS — O99340 Other mental disorders complicating pregnancy, unspecified trimester: Secondary | ICD-10-CM

## 2016-03-27 DIAGNOSIS — O9989 Other specified diseases and conditions complicating pregnancy, childbirth and the puerperium: Secondary | ICD-10-CM | POA: Diagnosis not present

## 2016-03-27 DIAGNOSIS — G7000 Myasthenia gravis without (acute) exacerbation: Secondary | ICD-10-CM

## 2016-03-27 DIAGNOSIS — O24319 Unspecified pre-existing diabetes mellitus in pregnancy, unspecified trimester: Secondary | ICD-10-CM

## 2016-03-27 DIAGNOSIS — F329 Major depressive disorder, single episode, unspecified: Secondary | ICD-10-CM

## 2016-03-27 DIAGNOSIS — O09529 Supervision of elderly multigravida, unspecified trimester: Secondary | ICD-10-CM

## 2016-03-27 DIAGNOSIS — O99280 Endocrine, nutritional and metabolic diseases complicating pregnancy, unspecified trimester: Secondary | ICD-10-CM

## 2016-03-27 DIAGNOSIS — Z79899 Other long term (current) drug therapy: Secondary | ICD-10-CM | POA: Diagnosis not present

## 2016-03-27 DIAGNOSIS — F32A Depression, unspecified: Secondary | ICD-10-CM

## 2016-03-27 LAB — COMPREHENSIVE METABOLIC PANEL
ALT: 12 U/L — ABNORMAL LOW (ref 14–54)
AST: 17 U/L (ref 15–41)
Albumin: 3 g/dL — ABNORMAL LOW (ref 3.5–5.0)
Alkaline Phosphatase: 58 U/L (ref 38–126)
Anion gap: 6 (ref 5–15)
BUN: 12 mg/dL (ref 6–20)
CO2: 26 mmol/L (ref 22–32)
Calcium: 8.7 mg/dL — ABNORMAL LOW (ref 8.9–10.3)
Chloride: 101 mmol/L (ref 101–111)
Creatinine, Ser: 0.59 mg/dL (ref 0.44–1.00)
GFR calc Af Amer: >60 mL/min (ref >60)
GFR calc non Af Amer: >60 mL/min (ref >60)
Glucose, Bld: 121 mg/dL — ABNORMAL HIGH (ref 65–99)
Potassium: 3.4 mmol/L — ABNORMAL LOW (ref 3.5–5.1)
Sodium: 133 mmol/L — ABNORMAL LOW (ref 135–145)
Total Bilirubin: 0.2 mg/dL — ABNORMAL LOW (ref 0.3–1.2)
Total Protein: 6.8 g/dL (ref 6.5–8.1)

## 2016-03-27 LAB — CBC
HCT: 32.7 % — ABNORMAL LOW (ref 36.0–46.0)
Hemoglobin: 10.9 g/dL — ABNORMAL LOW (ref 12.0–15.0)
MCH: 26.6 pg (ref 26.0–34.0)
MCHC: 33.3 g/dL (ref 30.0–36.0)
MCV: 79.8 fL (ref 78.0–100.0)
Platelets: 242 10*3/uL (ref 150–400)
RBC: 4.1 MIL/uL (ref 3.87–5.11)
RDW: 14.9 % (ref 11.5–15.5)
WBC: 7.5 10*3/uL (ref 4.0–10.5)

## 2016-03-27 LAB — URINALYSIS, ROUTINE W REFLEX MICROSCOPIC
Bacteria, UA: NONE SEEN
Bilirubin Urine: NEGATIVE
Glucose, UA: >=500 mg/dL — AB
Hgb urine dipstick: NEGATIVE
Ketones, ur: NEGATIVE mg/dL
Leukocytes, UA: NEGATIVE
Nitrite: NEGATIVE
Protein, ur: NEGATIVE mg/dL
Specific Gravity, Urine: 1.022 (ref 1.005–1.030)
pH: 5 (ref 5.0–8.0)

## 2016-03-27 MED ORDER — ACETAMINOPHEN 325 MG PO TABS
650.0000 mg | ORAL_TABLET | Freq: Four times a day (QID) | ORAL | Status: DC | PRN
  Administered 2016-03-27: 650 mg via ORAL
  Filled 2016-03-27: qty 2

## 2016-03-27 NOTE — MAU Note (Signed)
Pt C/O HA, earaches bilaterally, cramping in lower abdomen & on sides, also has "typical stomachache."  All sx's started on Saturday.  Has nausea, no vomiting or diarrhea, no fever.  No vaginal bleeding.

## 2016-03-27 NOTE — Discharge Instructions (Signed)
Second Trimester of Pregnancy The second trimester is from week 13 through week 28, month 4 through 6. This is often the time in pregnancy that you feel your best. Often times, morning sickness has lessened or quit. You may have more energy, and you may get hungry more often. Your unborn baby (fetus) is growing rapidly. At the end of the sixth month, he or she is about 9 inches long and weighs about 1 pounds. You will likely feel the baby move (quickening) between 18 and 20 weeks of pregnancy. Follow these instructions at home:  Avoid all smoking, herbs, and alcohol. Avoid drugs not approved by your doctor.  Do not use any tobacco products, including cigarettes, chewing tobacco, and electronic cigarettes. If you need help quitting, ask your doctor. You may get counseling or other support to help you quit.  Only take medicine as told by your doctor. Some medicines are safe and some are not during pregnancy.  Exercise only as told by your doctor. Stop exercising if you start having cramps.  Eat regular, healthy meals.  Wear a good support bra if your breasts are tender.  Do not use hot tubs, steam rooms, or saunas.  Wear your seat belt when driving.  Avoid raw meat, uncooked cheese, and liter boxes and soil used by cats.  Take your prenatal vitamins.  Take 1500-2000 milligrams of calcium daily starting at the 20th week of pregnancy until you deliver your baby.  Try taking medicine that helps you poop (stool softener) as needed, and if your doctor approves. Eat more fiber by eating fresh fruit, vegetables, and whole grains. Drink enough fluids to keep your pee (urine) clear or pale yellow.  Take warm water baths (sitz baths) to soothe pain or discomfort caused by hemorrhoids. Use hemorrhoid cream if your doctor approves.  If you have puffy, bulging veins (varicose veins), wear support hose. Raise (elevate) your feet for 15 minutes, 3-4 times a day. Limit salt in your diet.  Avoid heavy  lifting, wear low heals, and sit up straight.  Rest with your legs raised if you have leg cramps or low back pain.  Visit your dentist if you have not gone during your pregnancy. Use a soft toothbrush to brush your teeth. Be gentle when you floss.  You can have sex (intercourse) unless your doctor tells you not to.  Go to your doctor visits. Get help if:  You feel dizzy.  You have mild cramps or pressure in your lower belly (abdomen).  You have a nagging pain in your belly area.  You continue to feel sick to your stomach (nauseous), throw up (vomit), or have watery poop (diarrhea).  You have bad smelling fluid coming from your vagina.  You have pain with peeing (urination). Get help right away if:  You have a fever.  You are leaking fluid from your vagina.  You have spotting or bleeding from your vagina.  You have severe belly cramping or pain.  You lose or gain weight rapidly.  You have trouble catching your breath and have chest pain.  You notice sudden or extreme puffiness (swelling) of your face, hands, ankles, feet, or legs.  You have not felt the baby move in over an hour.  You have severe headaches that do not go away with medicine.  You have vision changes. This information is not intended to replace advice given to you by your health care provider. Make sure you discuss any questions you have with your health care   provider. Document Released: 05/31/2009 Document Revised: 08/12/2015 Document Reviewed: 05/07/2012 Elsevier Interactive Patient Education  2017 [employer] Inc.  
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
## 2016-03-28 ENCOUNTER — Other Ambulatory Visit (HOSPITAL_COMMUNITY): Payer: Self-pay | Admitting: *Deleted

## 2016-03-28 ENCOUNTER — Ambulatory Visit (HOSPITAL_COMMUNITY)
Admission: RE | Admit: 2016-03-28 | Discharge: 2016-03-28 | Disposition: A | Discharge status: Home / Self Care | Payer: Medicare Other | Source: Ambulatory Visit | Attending: Obstetrics & Gynecology | Admitting: Obstetrics & Gynecology

## 2016-03-28 ENCOUNTER — Other Ambulatory Visit: Payer: Self-pay | Admitting: *Deleted

## 2016-03-28 ENCOUNTER — Encounter (HOSPITAL_COMMUNITY): Payer: Self-pay

## 2016-03-28 ENCOUNTER — Other Ambulatory Visit: Payer: Self-pay | Admitting: Obstetrics & Gynecology

## 2016-03-28 ENCOUNTER — Ambulatory Visit (HOSPITAL_COMMUNITY): Payer: Medicare Other

## 2016-03-28 DIAGNOSIS — O99211 Obesity complicating pregnancy, first trimester: Secondary | ICD-10-CM | POA: Diagnosis not present

## 2016-03-28 DIAGNOSIS — O24111 Pre-existing diabetes mellitus, type 2, in pregnancy, first trimester: Secondary | ICD-10-CM

## 2016-03-28 DIAGNOSIS — Z3A12 12 weeks gestation of pregnancy: Secondary | ICD-10-CM | POA: Insufficient documentation

## 2016-03-28 DIAGNOSIS — O24912 Unspecified diabetes mellitus in pregnancy, second trimester: Secondary | ICD-10-CM

## 2016-03-28 DIAGNOSIS — O09521 Supervision of elderly multigravida, first trimester: Secondary | ICD-10-CM | POA: Diagnosis not present

## 2016-03-28 DIAGNOSIS — Z315 Encounter for genetic counseling: Secondary | ICD-10-CM | POA: Insufficient documentation

## 2016-03-28 DIAGNOSIS — Z3682 Encounter for antenatal screening for nuchal translucency: Secondary | ICD-10-CM | POA: Diagnosis not present

## 2016-03-28 DIAGNOSIS — Z3A11 11 weeks gestation of pregnancy: Secondary | ICD-10-CM | POA: Insufficient documentation

## 2016-03-28 MED ORDER — INSULIN SYRINGES (DISPOSABLE) U-100 1 ML MISC: 1.0000 | 12 refills | Status: DC | PRN

## 2016-03-28 NOTE — Progress Notes (Unsigned)
Pt needed rx for syringes. Rx to pharmacy.

## 2016-03-28 NOTE — Progress Notes (Signed)
Genetic Counseling  High-Risk Gestation Note  Appointment Date:  03/28/2016 Referred By: Osborne Oman, MD Date of Birth:  1975/06/11   Pregnancy History: [id number] Estimated Date of Delivery: 10/06/16 Estimated Gestational Age: [redacted]w[redacted]d Attending: Renella Cunas, MD  Ms. Ladawna Radillo was seen for genetic counseling because of a maternal age of 41 y.o..     In summary:  Discussed AMA and associated risk for fetal aneuploidy  Discussed options for screening  First screen-declined  Quad screen-declined  NIPS-performed today  Ultrasound-NT performed today; anatomy scheduled  Discussed diagnostic testing options  [facility]-declined  Amniocentesis-declined  FOB is 52- reviewed APA and available testing-declined  Reviewed family history concerns  Reviewed medical history  IDDM-fetal echocardiogram ordered today  Discussed carrier screening options  CF-declined  SMA-declined  Hemoglobinopathies-hemoglobin electrophoresis previously performed and wnl  She was counseled regarding maternal age and the association with risk for chromosome conditions due to nondisjunction with aging of the ova.  We reviewed chromosomes, nondisjunction, and the associated 1 in 23 risk for fetal aneuploidy related to a maternal age of 41 y.o. at [redacted]w[redacted]d gestation. She was counseled that the risk for aneuploidy decreases as gestational age increases, accounting for those pregnancies which spontaneously abort. We specifically discussed Down syndrome (trisomy 56), trisomies 32 and 74, and sex chromosome aneuploidies (47,XXX and 47,XXY) including the common features and prognoses of each.   We reviewed available screening options including First Screen, Quad screen, noninvasive prenatal screening (NIPS)/cell free DNA (cfDNA) screening, and detailed ultrasound. She was counseled that screening tests are used to modify a patient's a priori risk for aneuploidy, typically based on age. This estimate provides a  pregnancy specific risk assessment. We reviewed the benefits and limitations of each option. Specifically, we discussed the conditions for which each test screens, the detection rates, and false positive rates of each. She was also counseled regarding diagnostic testing via CVS and amniocentesis. We reviewed the associated risks for complications, including spontaneous pregnancy loss. We discussed the possible results that the tests might provide including: positive, negative, unanticipated, and no result. Finally, she was counseled regarding the cost of each option and potential out of pocket expenses. After consideration of all the options, she elected to proceed with NIPS.  Those results will be available in 5-7 days.    She also expressed interest in pursuing a nuchal translucency ultrasound, which was performed today.  The report will be documented separately. The patient would like to return for a detailed ultrasound at ~18+ weeks gestation.  This appointment was scheduled today. She understands that screening tests cannot rule out all birth defects or genetic syndromes. The patient was advised of this limitation and states she still does not want additional testing at this time.   In addition, Ms. Kettlewell reported that her partner is 67 years old. She was counseled that advanced paternal age (APA) is defined as paternal age greater than or equal to age 51.  Recent large-scale sequencing studies have shown that approximately 80% of de novo point mutations are of paternal origin.  Many studies have demonstrated a strong correlation between increased paternal age and de novo point mutations.  Although no specific data is available regarding fetal risks for fathers 37+ years old at conception, it is apparent that the overall risk for single gene conditions is increased.  To estimate the relative increase in risk of a genetic disorder with APA, the heritability of the disease must be considered.  Assuming an  approximate 2x increase in risk  for conditions that are exclusively paternal in origin, the risk for each individual condition is still relatively low.  It is estimated that the overall chance for a de novo mutation is ~0.5%.  We also discussed the wide range of conditions which can be caused by new dominant gene mutations (achondroplasia, neurofibromatosis, Marfan syndrome etc.).      She was counseled that diagnostic testing for each individual single gene condition is not warranted or available unless ultrasound or concerns lend suspicion to a specific condition. However, there is another NIPS platform ([facility] through [place]) that is able to assess for specific mutations in a panel of 30 selected genes covering 26 conditions. Most of these conditions follow an autosomal dominant pattern of inheritance and typically occur due to de novo gene mutations. The detection rates for these conditions vary depending upon the specific condition but range from 43% to 96%. Therefore, this screening would not identify all new dominant gene mutations. She declined additional screening with [facility].  Lastly, we discussed that newer literature suggests that the risk for autism spectrum disorders (ASD) may be increased in children born to fathers of APA.  We discussed that ASDs are among the most common neurodevelopmental disorders, with approximately 1 in 85 children meeting criteria for ASD.  Approximately 80% of individuals diagnosed are female.  There is strong evidence that genetic factors play a critical role in development of ASD.  While there have been recent advances in identifying specific genetic causes of ASD, there are still many individuals for whom the etiology of the ASD is not known.  They understand that at this time there is no reliable, comprehensive genetic testing available for ASD.     Ms. Depaola was provided with written information regarding cystic fibrosis (CF), spinal muscular atrophy (SMA) and  hemoglobinopathies including the carrier frequency, availability of carrier screening and prenatal diagnosis if indicated.  In addition, we discussed that CF and hemoglobinopathies are routinely screened for as part of the [place] newborn screening panel.  After further discussion, she declined genetic screening for CF, SMA and hemoglobinopathies. Ms. Warhurst previously had hemoglobin electrophoresis, which was normal. We reviewed the results.  Both family histories were reviewed and found to be noncontributory for birth defects, intellectual disability, and known genetic conditions. Without further information regarding the provided family history, an accurate genetic risk cannot be calculated. Further genetic counseling is warranted if more information is obtained.  Ms. Rutz denied exposure to environmental toxins or chemical agents. She denied the use of alcohol, tobacco or street drugs. She denied significant viral illnesses during the course of her pregnancy. Ms. Custodio has a complex medical history. We discussed the option of an MFM consult, which can be requested by her provider, if wanted. We reviewed her history of IDDM and her recent A1C value of 7.5%. We discussed the fact that women who have insulin dependent diabetes are at an increased risk to have a baby with a birth defect.  The increase in risk correlates with the level of blood sugar control during the pregnancy, particularly during organogenesis.  The increase in risk is for any type of birth defect but is greatest for heart, limb, and neural tube defects.  The risk could be as high as 6-10% for individuals whose blood sugars are not well-controlled, but lower for women who have good blood sugar control throughout pregnancy. We discussed the option of a fetal echocardiogram, which was ordered today.   I counseled Ms. Gillan regarding the above risks  and available options.  The approximate face-to-face time with the genetic counselor was 38  minutes.  Filbert Schilder, MS Certified Genetic Counselor

## 2016-03-29 MED ORDER — INSULIN SYRINGES (DISPOSABLE) U-100 1 ML MISC: 1.0000 | 12 refills | Status: DC | PRN

## 2016-03-30 ENCOUNTER — Ambulatory Visit (INDEPENDENT_AMBULATORY_CARE_PROVIDER_SITE_OTHER): Payer: Medicare Other | Admitting: Endocrinology

## 2016-03-30 ENCOUNTER — Encounter: Payer: Self-pay | Admitting: Endocrinology

## 2016-03-30 VITALS — BP 108/72 | Ht 66.0 in | Wt 242.0 lb

## 2016-03-30 DIAGNOSIS — E1165 Type 2 diabetes mellitus with hyperglycemia: Secondary | ICD-10-CM | POA: Diagnosis not present

## 2016-03-30 DIAGNOSIS — Z794 Long term (current) use of insulin: Secondary | ICD-10-CM

## 2016-03-30 NOTE — Patient Instructions (Addendum)
Humalog 18 at supper and 15 at other meals  Levemir 54 and go up as directed
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
## 2016-04-03 ENCOUNTER — Other Ambulatory Visit: Payer: Self-pay

## 2016-04-04 ENCOUNTER — Telehealth (HOSPITAL_COMMUNITY): Payer: Self-pay | Admitting: MS"

## 2016-04-04 DIAGNOSIS — Z23 Encounter for immunization: Secondary | ICD-10-CM | POA: Diagnosis not present

## 2016-04-04 DIAGNOSIS — R760 Raised antibody titer: Secondary | ICD-10-CM | POA: Diagnosis not present

## 2016-04-04 NOTE — Telephone Encounter (Signed)
[place] to discuss her prenatal cell free DNA test results.  Jade Mathis had Panor[facility] testing through [facility].  Testing was offered because of advanced maternal age.  The patient was identified by name and DOB.  We reviewed that these are within normal limits, showing a less than 1 in 10,000 risk for trisomies 21, 18 and 13, and monosomy X (Turner syndrome).  In addition, the risk for triploidy and sex chromosome trisomies (47,XXX and 47,XXY) was also low risk.  We reviewed that this testing identifies > 99% of pregnancies with trisomy 84, trisomy 87, sex chromosome trisomies (47,XXX and 47,XXY), and triploidy. The detection rate for trisomy 18 is 96%.  The detection rate for monosomy X is ~92%.  The false positive rate is <0.1% for all conditions. Testing was also consistent with female fetal sex.  The patient did wish to know fetal sex.  She understands that this testing does not identify all genetic conditions.  All questions were answered to her satisfaction, she was encouraged to call with additional questions or concerns.  Chipper Oman, MS Certified Genetic Counselor 04/04/2016 9:00 AM

## 2016-04-05 ENCOUNTER — Encounter: Payer: Self-pay | Admitting: Obstetrics and Gynecology

## 2016-04-13 ENCOUNTER — Telehealth: Payer: Self-pay

## 2016-04-13 NOTE — Telephone Encounter (Signed)
Received form from [facility] concerning patients Diclegis prescription. [employer] /Medicare will only pay for temporary supply unless we submit the required paperwork. I have left a message for patient to call us back concerning

## 2016-04-13 NOTE — Telephone Encounter (Signed)
Left a message for patient to call us back concerning her prescription

## 2016-04-14 ENCOUNTER — Ambulatory Visit (INDEPENDENT_AMBULATORY_CARE_PROVIDER_SITE_OTHER): Payer: Medicare Other | Admitting: Neurology

## 2016-04-14 ENCOUNTER — Encounter: Payer: Self-pay | Admitting: Neurology

## 2016-04-14 VITALS — BP 100/70 | HR 76 | Ht 66.0 in | Wt 243.5 lb

## 2016-04-14 DIAGNOSIS — G7000 Myasthenia gravis without (acute) exacerbation: Secondary | ICD-10-CM | POA: Diagnosis not present

## 2016-04-14 NOTE — Patient Instructions (Addendum)
Continue prednisone 10mg  daily Continue mestinon 60mg  three times daily  Return to clinic in May
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
## 2016-04-17 ENCOUNTER — Other Ambulatory Visit: Payer: Self-pay | Admitting: Obstetrics & Gynecology

## 2016-04-17 NOTE — Telephone Encounter (Signed)
Per chart review, patient had wrong diclegis form submitted. Correct one filled out & faxed

## 2016-04-19 ENCOUNTER — Ambulatory Visit (INDEPENDENT_AMBULATORY_CARE_PROVIDER_SITE_OTHER): Payer: Medicare Other | Admitting: Obstetrics & Gynecology

## 2016-04-19 ENCOUNTER — Other Ambulatory Visit (HOSPITAL_COMMUNITY)
Admission: RE | Admit: 2016-04-19 | Discharge: 2016-04-19 | Disposition: A | Discharge status: Home / Self Care | Payer: Medicare Other | Source: Ambulatory Visit | Attending: Obstetrics & Gynecology | Admitting: Obstetrics & Gynecology

## 2016-04-19 VITALS — BP 117/67 | HR 88 | Wt 243.8 lb

## 2016-04-19 DIAGNOSIS — G7000 Myasthenia gravis without (acute) exacerbation: Secondary | ICD-10-CM

## 2016-04-19 DIAGNOSIS — O0990 Supervision of high risk pregnancy, unspecified, unspecified trimester: Secondary | ICD-10-CM | POA: Diagnosis present

## 2016-04-19 DIAGNOSIS — E669 Obesity, unspecified: Secondary | ICD-10-CM

## 2016-04-19 DIAGNOSIS — E1169 Type 2 diabetes mellitus with other specified complication: Secondary | ICD-10-CM

## 2016-04-19 DIAGNOSIS — O24319 Unspecified pre-existing diabetes mellitus in pregnancy, unspecified trimester: Secondary | ICD-10-CM

## 2016-04-19 DIAGNOSIS — O99350 Diseases of the nervous system complicating pregnancy, unspecified trimester: Secondary | ICD-10-CM

## 2016-04-19 DIAGNOSIS — O99212 Obesity complicating pregnancy, second trimester: Secondary | ICD-10-CM

## 2016-04-19 DIAGNOSIS — O99282 Endocrine, nutritional and metabolic diseases complicating pregnancy, second trimester: Secondary | ICD-10-CM

## 2016-04-19 LAB — POCT URINALYSIS DIP (DEVICE)
Bilirubin Urine: NEGATIVE
Glucose, UA: 100 mg/dL — AB
Hgb urine dipstick: NEGATIVE
Ketones, ur: NEGATIVE mg/dL
Leukocytes, UA: NEGATIVE
Nitrite: NEGATIVE
Protein, ur: NEGATIVE mg/dL
Specific Gravity, Urine: 1.025 (ref 1.005–1.030)
Urobilinogen, UA: 1 mg/dL (ref 0.0–1.0)
pH: 5.5 (ref 5.0–8.0)

## 2016-04-19 MED ORDER — INSULIN DETEMIR 100 UNIT/ML FLEXPEN: SUBCUTANEOUS | 5 refills | Status: DC

## 2016-04-19 MED ORDER — INSULIN DETEMIR 100 UNIT/ML FLEXPEN
SUBCUTANEOUS | 5 refills | Status: DC
Start: 2016-04-19 — End: 2016-05-18

## 2016-04-19 NOTE — Progress Notes (Signed)
C/o pelvic pain and vaginal odor. Flat affect.

## 2016-04-19 NOTE — Progress Notes (Signed)
   PRENATAL VISIT NOTE  Subjective:pressure and discharge  Jade Mathis is a 41 y.o. [phone] at [redacted]w[redacted]d being seen today for ongoing prenatal care.  She is currently monitored for the following issues for this high-risk pregnancy and has HYPOTHYROIDISM, POSTSURGICAL; Diabetes mellitus type 2 in obese ([place]); OBESITY, NOS; DEPRESSION, MAJOR, RECURRENT, MODERATE; MIGRAINE HEADACHE; Myasthenia gravis ([place]); Asthma; GERD; OSA (obstructive sleep apnea); Pure hypercholesterolemia; Chronic pain syndrome; Stool incontinence; Ventral hernia; Preexisting diabetes complicating pregnancy, antepartum; Myasthenia gravis complicating pregnancy ([place]); Depression affecting pregnancy, antepartum; Hypothyroidism affecting pregnancy, antepartum; Advanced maternal age in multigravida, first trimester; and Supervision of high-risk pregnancy on her problem list.  Patient reports vaginal irritation.  Contractions: Not present. Vag. Bleeding: None.  Movement: Absent. Denies leaking of fluid.   The following portions of the patient's history were reviewed and updated as appropriate: allergies, current medications, past family history, past medical history, past social history, past surgical history and problem list. Problem list updated.  Objective:   Vitals:   04/19/16 0757  BP: 117/67  Pulse: 88  Weight: 243 lb 12.8 oz (110.6 kg)    Fetal Status: Fetal Heart Rate (bpm): 150   Movement: Absent     General:  Alert, oriented and cooperative. Patient is in no acute distress.  Skin: Skin is warm and dry. No rash noted.   Cardiovascular: Normal heart rate noted  Respiratory: Normal respiratory effort, no problems with respiration noted  Abdomen: Soft, gravid, appropriate for gestational age. Pain/Pressure: Present     Pelvic:  Cervical exam deferred        Extremities: Normal range of motion.  Edema: None  Mental Status: Normal mood and affect. Normal behavior. Normal judgment and thought content.   Assessment and  Plan:  Pregnancy: [id number] at [redacted]w[redacted]d  1. Supervision of high risk pregnancy, antepartum Wet prep done - US Fetal Echocardiography; Future - Cervicovaginal ancillary only  2. Myasthenia gravis complicating pregnancy ([place])   3. Diabetes mellitus type 2 in obese [facility])  - US Fetal Echocardiography; Future  4. Preexisting diabetes complicating pregnancy, antepartum Increase dose for FBS all 120-150 - insulin detemir (LEVEMIR) 100 unit/ml SOLN; 18 units in the morning and 54 units at bedtime.  Dispense: 1 vial; Refill: 5  Preterm labor symptoms and general obstetric precautions including but not limited to vaginal bleeding, contractions, leaking of fluid and fetal movement were reviewed in detail with the patient. Please refer to After Visit Summary for other counseling recommendations.  Return in about 2 weeks (around 05/03/2016).   Woodroe Mode, MD

## 2016-04-20 LAB — CERVICOVAGINAL ANCILLARY ONLY
Bacterial vaginitis: NEGATIVE
Candida vaginitis: NEGATIVE
Trichomonas: NEGATIVE

## 2016-04-24 ENCOUNTER — Telehealth: Payer: Self-pay

## 2016-04-24 ENCOUNTER — Other Ambulatory Visit (INDEPENDENT_AMBULATORY_CARE_PROVIDER_SITE_OTHER): Payer: Medicare Other

## 2016-04-24 DIAGNOSIS — Z794 Long term (current) use of insulin: Secondary | ICD-10-CM

## 2016-04-24 DIAGNOSIS — E1165 Type 2 diabetes mellitus with hyperglycemia: Secondary | ICD-10-CM | POA: Diagnosis not present

## 2016-04-24 LAB — BASIC METABOLIC PANEL
BUN: 11 mg/dL (ref 6–23)
CO2: 25 mEq/L (ref 19–32)
Calcium: 9.1 mg/dL (ref 8.4–10.5)
Chloride: 102 mEq/L (ref 96–112)
Creatinine, Ser: 0.47 mg/dL (ref 0.40–1.20)
GFR: 187.65 mL/min (ref >60.00)
Glucose, Bld: 150 mg/dL — ABNORMAL HIGH (ref 70–99)
Potassium: 3.6 mEq/L (ref 3.5–5.1)
Sodium: 133 mEq/L — ABNORMAL LOW (ref 135–145)

## 2016-04-24 LAB — TSH: TSH: 3.27 u[IU]/mL (ref 0.35–4.50)

## 2016-04-24 LAB — HEMOGLOBIN A1C: Hgb A1c MFr Bld: 6.2 % (ref 4.6–6.5)

## 2016-04-24 LAB — GLUCOSE, RANDOM: Glucose, Bld: 150 mg/dL — ABNORMAL HIGH (ref 70–99)

## 2016-04-24 NOTE — Telephone Encounter (Signed)
Patient has been informed of negative vaginitis test results. She will follow up with our office prn. Patient voice understanding.

## 2016-04-27 DIAGNOSIS — Z79899 Other long term (current) drug therapy: Secondary | ICD-10-CM | POA: Diagnosis not present

## 2016-04-27 DIAGNOSIS — M5412 Radiculopathy, cervical region: Secondary | ICD-10-CM | POA: Diagnosis not present

## 2016-04-28 ENCOUNTER — Ambulatory Visit (INDEPENDENT_AMBULATORY_CARE_PROVIDER_SITE_OTHER): Payer: Medicare Other | Admitting: Endocrinology

## 2016-04-28 ENCOUNTER — Encounter: Payer: Self-pay | Admitting: Endocrinology

## 2016-04-28 VITALS — BP 100/66 | HR 104 | Ht 66.0 in | Wt 247.0 lb

## 2016-04-28 DIAGNOSIS — E038 Other specified hypothyroidism: Secondary | ICD-10-CM | POA: Diagnosis not present

## 2016-04-28 DIAGNOSIS — Z794 Long term (current) use of insulin: Secondary | ICD-10-CM

## 2016-04-28 DIAGNOSIS — E1165 Type 2 diabetes mellitus with hyperglycemia: Secondary | ICD-10-CM

## 2016-04-28 DIAGNOSIS — E063 Autoimmune thyroiditis: Secondary | ICD-10-CM
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
## 2016-04-28 NOTE — Patient Instructions (Signed)
Levemir 58 in pm and may go up every 3 days till am sugar <100  Thyroid extra 1/2 pill on Saturdays

## 2016-04-29 ENCOUNTER — Other Ambulatory Visit: Payer: Self-pay | Admitting: Internal Medicine

## 2016-05-03 ENCOUNTER — Ambulatory Visit (INDEPENDENT_AMBULATORY_CARE_PROVIDER_SITE_OTHER): Payer: Medicare Other | Admitting: Obstetrics & Gynecology

## 2016-05-03 VITALS — BP 116/60 | HR 90 | Wt 248.0 lb

## 2016-05-03 DIAGNOSIS — O24319 Unspecified pre-existing diabetes mellitus in pregnancy, unspecified trimester: Secondary | ICD-10-CM

## 2016-05-03 DIAGNOSIS — O24312 Unspecified pre-existing diabetes mellitus in pregnancy, second trimester: Secondary | ICD-10-CM

## 2016-05-03 DIAGNOSIS — O0992 Supervision of high risk pregnancy, unspecified, second trimester: Secondary | ICD-10-CM

## 2016-05-03 LAB — POCT URINALYSIS DIP (DEVICE)
Bilirubin Urine: NEGATIVE
Glucose, UA: NEGATIVE mg/dL
Hgb urine dipstick: NEGATIVE
Ketones, ur: NEGATIVE mg/dL
Leukocytes, UA: NEGATIVE
Nitrite: NEGATIVE
Protein, ur: NEGATIVE mg/dL
Specific Gravity, Urine: 1.015 (ref 1.005–1.030)
Urobilinogen, UA: 0.2 mg/dL (ref 0.0–1.0)
pH: 7 (ref 5.0–8.0)

## 2016-05-03 MED ORDER — PANTOPRAZOLE SODIUM 40 MG PO TBEC
40.0000 mg | DELAYED_RELEASE_TABLET | Freq: Every day | ORAL | 5 refills | Status: DC
Start: 2016-05-03 — End: 2016-09-25

## 2016-05-03 NOTE — Patient Instructions (Signed)
Second Trimester of Pregnancy The second trimester is from week 13 through week 28 (months 4 through 6). The second trimester is often a time when you feel your best. Your body has also adjusted to being pregnant, and you begin to feel better physically. Usually, morning sickness has lessened or quit completely, you may have more energy, and you may have an increase in appetite. The second trimester is also a time when the fetus is growing rapidly. At the end of the sixth month, the fetus is about 9 inches long and weighs about 1 pounds. You will likely begin to feel the baby move (quickening) between 18 and 20 weeks of the pregnancy. Body changes during your second trimester Your body continues to go through many changes during your second trimester. The changes vary from woman to woman.  Your weight will continue to increase. You will notice your lower abdomen bulging out.  You may begin to get stretch marks on your hips, abdomen, and breasts.  You may develop headaches that can be relieved by medicines. The medicines should be approved by your health care provider.  You may urinate more often because the fetus is pressing on your bladder.  You may develop or continue to have heartburn as a result of your pregnancy.  You may develop constipation because certain hormones are causing the muscles that push waste through your intestines to slow down.  You may develop hemorrhoids or swollen, bulging veins (varicose veins).  You may have back pain. This is caused by:  Weight gain.  Pregnancy hormones that are relaxing the joints in your pelvis.  A shift in weight and the muscles that support your balance.  Your breasts will continue to grow and they will continue to become tender.  Your gums may bleed and may be sensitive to brushing and flossing.  Dark spots or blotches (chloasma, mask of pregnancy) may develop on your face. This will likely fade after the baby is born.  A dark line  from your belly button to the pubic area (linea nigra) may appear. This will likely fade after the baby is born.  You may have changes in your hair. These can include thickening of your hair, rapid growth, and changes in texture. Some women also have hair loss during or after pregnancy, or hair that feels dry or thin. Your hair will most likely return to normal after your baby is born. What to expect at prenatal visits During a routine prenatal visit:  You will be weighed to make sure you and the fetus are growing normally.  Your blood pressure will be taken.  Your abdomen will be measured to track your baby's growth.  The fetal heartbeat will be listened to.  Any test results from the previous visit will be discussed. Your health care provider may ask you:  How you are feeling.  If you are feeling the baby move.  If you have had any abnormal symptoms, such as leaking fluid, bleeding, severe headaches, or abdominal cramping.  If you are using any tobacco products, including cigarettes, chewing tobacco, and electronic cigarettes.  If you have any questions. Other tests that may be performed during your second trimester include:  Blood tests that check for:  Low iron levels (anemia).  Gestational diabetes (between 24 and 28 weeks).  Rh antibodies. This is to check for a protein on red blood cells (Rh factor).  Urine tests to check for infections, diabetes, or protein in the urine.  An ultrasound to  confirm the proper growth and development of the baby.  An amniocentesis to check for possible genetic problems.  Fetal screens for spina bifida and Down syndrome.  HIV (human immunodeficiency virus) testing. Routine prenatal testing includes screening for HIV, unless you choose not to have this test. Follow these instructions at home: Eating and drinking  Continue to eat regular, healthy meals.  Avoid raw meat, uncooked cheese, cat litter boxes, and soil used by cats. These  carry germs that can cause birth defects in the baby.  Take your prenatal vitamins.  Take 1500-2000 mg of calcium daily starting at the 20th week of pregnancy until you deliver your baby.  If you develop constipation:  Take over-the-counter or prescription medicines.  Drink enough fluid to keep your urine clear or pale yellow.  Eat foods that are high in fiber, such as fresh fruits and vegetables, whole grains, and beans.  Limit foods that are high in fat and processed sugars, such as fried and sweet foods. Activity  Exercise only as directed by your health care provider. Experiencing uterine cramps is a good sign to stop exercising.  Avoid heavy lifting, wear low heel shoes, and practice good posture.  Wear your seat belt at all times when driving.  Rest with your legs elevated if you have leg cramps or low back pain.  Wear a good support bra for breast tenderness.  Do not use hot tubs, steam rooms, or saunas. Lifestyle  Avoid all smoking, herbs, alcohol, and unprescribed drugs. These chemicals affect the formation and growth of the baby.  Do not use any products that contain nicotine or tobacco, such as cigarettes and e-cigarettes. If you need help quitting, ask your health care provider.  A sexual relationship may be continued unless your health care provider directs you otherwise. General instructions  Follow your health care provider's instructions regarding medicine use. There are medicines that are either safe or unsafe to take during pregnancy.  Take warm sitz baths to soothe any pain or discomfort caused by hemorrhoids. Use hemorrhoid cream if your health care provider approves.  If you develop varicose veins, wear support hose. Elevate your feet for 15 minutes, 3-4 times a day. Limit salt in your diet.  Visit your dentist if you have not gone yet during your pregnancy. Use a soft toothbrush to brush your teeth and be gentle when you floss.  Keep all follow-up  prenatal visits as told by your health care provider. This is important. Contact a health care provider if:  You have dizziness.  You have mild pelvic cramps, pelvic pressure, or nagging pain in the abdominal area.  You have persistent nausea, vomiting, or diarrhea.  You have a bad smelling vaginal discharge.  You have pain with urination. Get help right away if:  You have a fever.  You are leaking fluid from your vagina.  You have spotting or bleeding from your vagina.  You have severe abdominal cramping or pain.  You have rapid weight gain or weight loss.  You have shortness of breath with chest pain.  You notice sudden or extreme swelling of your face, hands, ankles, feet, or legs.  You have not felt your baby move in over an hour.  You have severe headaches that do not go away with medicine.  You have vision changes. Summary  The second trimester is from week 13 through week 28 (months 4 through 6). It is also a time when the fetus is growing rapidly.  Your body goes  through many changes during pregnancy. The changes vary from woman to woman.  Avoid all smoking, herbs, alcohol, and unprescribed drugs. These chemicals affect the formation and growth your baby.  Do not use any tobacco products, such as cigarettes, chewing tobacco, and e-cigarettes. If you need help quitting, ask your health care provider.  Contact your health care provider if you have any questions. Keep all prenatal visits as told by your health care provider. This is important. This information is not intended to replace advice given to you by your health care provider. Make sure you discuss any questions you have with your health care provider. Document Released: 02/28/2001 Document Revised: 08/12/2015 Document Reviewed: 05/07/2012 Elsevier Interactive Patient Education  2017 [employer].

## 2016-05-03 NOTE — Progress Notes (Signed)
   PRENATAL VISIT NOTE  Subjective:  Jade Mathis is a 41 y.o. [phone] at [redacted]w[redacted]d being seen today for ongoing prenatal care.  She is currently monitored for the following issues for this high-risk pregnancy and has HYPOTHYROIDISM, POSTSURGICAL; Diabetes mellitus type 2 in obese ([place]); OBESITY, NOS; DEPRESSION, MAJOR, RECURRENT, MODERATE; MIGRAINE HEADACHE; Myasthenia gravis ([place]); Asthma; GERD; OSA (obstructive sleep apnea); Pure hypercholesterolemia; Chronic pain syndrome; Stool incontinence; Ventral hernia; Preexisting diabetes complicating pregnancy, antepartum; Myasthenia gravis complicating pregnancy ([place]); Depression affecting pregnancy, antepartum; Hypothyroidism affecting pregnancy, antepartum; Advanced maternal age in multigravida, first trimester; and Supervision of high-risk pregnancy on her problem list.  Patient reports heartburn and nausea.  Contractions: Not present. Vag. Bleeding: None.  Movement: Absent. Denies leaking of fluid.   The following portions of the patient's history were reviewed and updated as appropriate: allergies, current medications, past family history, past medical history, past social history, past surgical history and problem list. Problem list updated.  Objective:   Vitals:   05/03/16 0949  BP: 116/60  Pulse: 90  Weight: 248 lb (112.5 kg)    Fetal Status: Fetal Heart Rate (bpm): 145   Movement: Absent     General:  Alert, oriented and cooperative. Patient is in no acute distress.  Skin: Skin is warm and dry. No rash noted.   Cardiovascular: Normal heart rate noted  Respiratory: Normal respiratory effort, no problems with respiration noted  Abdomen: Soft, gravid, appropriate for gestational age. Pain/Pressure: Absent     Pelvic:  Cervical exam deferred        Extremities: Normal range of motion.  Edema: None  Mental Status: Normal mood and affect. Normal behavior. Normal judgment and thought content.   Assessment and Plan:  Pregnancy: [id number] at  [redacted]w[redacted]d  1. Supervision of high risk pregnancy in second trimester reflux - pantoprazole (PROTONIX) 40 MG tablet; Take 1 tablet (40 mg total) by mouth daily.  Dispense: 30 tablet; Refill: 5  2. Preexisting diabetes complicating pregnancy, antepartum FBS 120-150, PP 120-177, refer to DM coordinator  - Referral to Nutrition and Diabetes Services  Preterm labor symptoms and general obstetric precautions including but not limited to vaginal bleeding, contractions, leaking of fluid and fetal movement were reviewed in detail with the patient. Please refer to After Visit Summary for other counseling recommendations.  Return in about 2 weeks (around 05/17/2016).   Woodroe Mode, MD

## 2016-05-05 LAB — ALPHA FETOPROTEIN, MATERNAL
AFP: 24.1 ng/mL
Curr Gest Age: 17.7 weeks
MoM for AFP: 0.91
Open Spina bifida: NEGATIVE
Osb Risk: 1:10600 {titer}

## 2016-05-08 ENCOUNTER — Other Ambulatory Visit: Payer: Self-pay

## 2016-05-09 ENCOUNTER — Other Ambulatory Visit (HOSPITAL_COMMUNITY): Payer: Self-pay | Admitting: *Deleted

## 2016-05-09 ENCOUNTER — Encounter (HOSPITAL_COMMUNITY): Payer: Self-pay

## 2016-05-09 ENCOUNTER — Other Ambulatory Visit (HOSPITAL_COMMUNITY): Payer: Self-pay | Admitting: Maternal and Fetal Medicine

## 2016-05-09 ENCOUNTER — Ambulatory Visit (HOSPITAL_COMMUNITY)
Admission: RE | Admit: 2016-05-09 | Discharge: 2016-05-09 | Disposition: A | Discharge status: Home / Self Care | Payer: Medicare Other | Source: Ambulatory Visit | Attending: Obstetrics & Gynecology | Admitting: Obstetrics & Gynecology

## 2016-05-09 DIAGNOSIS — O24112 Pre-existing diabetes mellitus, type 2, in pregnancy, second trimester: Secondary | ICD-10-CM | POA: Insufficient documentation

## 2016-05-09 DIAGNOSIS — Z3A18 18 weeks gestation of pregnancy: Secondary | ICD-10-CM

## 2016-05-09 DIAGNOSIS — E039 Hypothyroidism, unspecified: Secondary | ICD-10-CM

## 2016-05-09 DIAGNOSIS — O99212 Obesity complicating pregnancy, second trimester: Secondary | ICD-10-CM | POA: Diagnosis not present

## 2016-05-09 DIAGNOSIS — O24912 Unspecified diabetes mellitus in pregnancy, second trimester: Secondary | ICD-10-CM

## 2016-05-09 DIAGNOSIS — O99282 Endocrine, nutritional and metabolic diseases complicating pregnancy, second trimester: Secondary | ICD-10-CM

## 2016-05-09 DIAGNOSIS — O09522 Supervision of elderly multigravida, second trimester: Secondary | ICD-10-CM

## 2016-05-09 DIAGNOSIS — Z0489 Encounter for examination and observation for other specified reasons: Secondary | ICD-10-CM

## 2016-05-09 DIAGNOSIS — IMO0002 Reserved for concepts with insufficient information to code with codable children: Secondary | ICD-10-CM

## 2016-05-09 DIAGNOSIS — Z3689 Encounter for other specified antenatal screening: Secondary | ICD-10-CM | POA: Insufficient documentation

## 2016-05-09 DIAGNOSIS — O24111 Pre-existing diabetes mellitus, type 2, in pregnancy, first trimester: Secondary | ICD-10-CM

## 2016-05-12 ENCOUNTER — Ambulatory Visit: Payer: Self-pay | Admitting: Dietician

## 2016-05-14 ENCOUNTER — Encounter: Payer: Self-pay | Admitting: Obstetrics & Gynecology

## 2016-05-18 ENCOUNTER — Ambulatory Visit (INDEPENDENT_AMBULATORY_CARE_PROVIDER_SITE_OTHER): Payer: Medicare Other | Admitting: Obstetrics & Gynecology

## 2016-05-18 VITALS — BP 110/67 | HR 102 | Wt 252.1 lb

## 2016-05-18 DIAGNOSIS — O99352 Diseases of the nervous system complicating pregnancy, second trimester: Secondary | ICD-10-CM

## 2016-05-18 DIAGNOSIS — O99350 Diseases of the nervous system complicating pregnancy, unspecified trimester: Secondary | ICD-10-CM

## 2016-05-18 DIAGNOSIS — O0992 Supervision of high risk pregnancy, unspecified, second trimester: Secondary | ICD-10-CM

## 2016-05-18 DIAGNOSIS — O24312 Unspecified pre-existing diabetes mellitus in pregnancy, second trimester: Secondary | ICD-10-CM

## 2016-05-18 DIAGNOSIS — O24319 Unspecified pre-existing diabetes mellitus in pregnancy, unspecified trimester: Secondary | ICD-10-CM

## 2016-05-18 DIAGNOSIS — G7000 Myasthenia gravis without (acute) exacerbation: Secondary | ICD-10-CM

## 2016-05-18 MED ORDER — INSULIN DETEMIR 100 UNIT/ML FLEXPEN: SUBCUTANEOUS | 5 refills | Status: DC

## 2016-05-18 MED ORDER — PRENATAL MULTI +DHA 27-0.8-228 MG PO CAPS: 1.0000 | ORAL_CAPSULE | Freq: Every day | ORAL | 10 refills | Status: DC

## 2016-05-18 NOTE — Patient Instructions (Signed)
Insulin Changes Humalog 18 with meals  Levemir 18 in morning, 58 at night  Recheck in 2 weeks.

## 2016-05-18 NOTE — Progress Notes (Signed)
   PRENATAL VISIT NOTE  Subjective:  Elaina Taing is a 41 y.o. [phone] at [redacted]w[redacted]d being seen today for ongoing prenatal care.  She is currently monitored for the following issues for this high-risk pregnancy and has HYPOTHYROIDISM, POSTSURGICAL; Diabetes mellitus type 2 in obese ([place]); OBESITY, NOS; DEPRESSION, MAJOR, RECURRENT, MODERATE; MIGRAINE HEADACHE; Myasthenia gravis ([place]); Asthma; GERD; OSA (obstructive sleep apnea); Pure hypercholesterolemia; Chronic pain syndrome; Stool incontinence; Ventral hernia; Preexisting diabetes complicating pregnancy, antepartum; Myasthenia gravis complicating pregnancy ([place]); Depression affecting pregnancy, antepartum; Hypothyroidism affecting pregnancy, antepartum; Advanced maternal age in multigravida; and Supervision of high-risk pregnancy on her problem list.  Patient reports occasional heartburn.  Contractions: Irritability. Vag. Bleeding: None.  Movement: Absent. Denies leaking of fluid.   The following portions of the patient's history were reviewed and updated as appropriate: allergies, current medications, past family history, past medical history, past social history, past surgical history and problem list. Problem list updated.  Objective:   Vitals:   05/18/16 1233  BP: 110/67  Pulse: (!) 102  Weight: 252 lb 1.6 oz (114.4 kg)    Fetal Status: Fetal Heart Rate (bpm): 145   Movement: Absent     General:  Alert, oriented and cooperative. Patient is in no acute distress.  Skin: Skin is warm and dry. No rash noted.   Cardiovascular: Normal heart rate noted  Respiratory: Normal respiratory effort, no problems with respiration noted  Abdomen: Soft, gravid, appropriate for gestational age. Pain/Pressure: Present     Pelvic:  Cervical exam deferred        Extremities: Normal range of motion.  Edema: Trace  Mental Status: Normal mood and affect. Normal behavior. Normal judgment and thought content.   Assessment and Plan:  Pregnancy: [id number] at  [redacted]w[redacted]d  1. Preexisting diabetes complicating pregnancy, antepartum Reported elevated fastings  >100, normal PP. Did not bring log book. Will bring next visit. Increased nighttime Levemir, continue Humalog 18 qac.  Already scheduled for growth scans, normal anatomy.  Fetal ECHO is tomorrow.  Will schedule eye and foot exam soon.  - insulin detemir (LEVEMIR) 100 unit/ml SOLN; 18 units in the morning and 58 units at bedtime.  Dispense: 1 vial; Refill: 5  2. Myasthenia gravis complicating pregnancy ([place]) No complications  3. Supervision of high risk pregnancy in second trimester No other complaints or concerns.  Routine obstetric precautions reviewed. Please refer to After Visit Summary for other counseling recommendations.  Return in about 2 weeks (around 06/01/2016) for OB Visit.   Osborne Oman, MD

## 2016-05-23 ENCOUNTER — Other Ambulatory Visit (INDEPENDENT_AMBULATORY_CARE_PROVIDER_SITE_OTHER): Payer: Medicare Other

## 2016-05-23 DIAGNOSIS — E1165 Type 2 diabetes mellitus with hyperglycemia: Secondary | ICD-10-CM

## 2016-05-23 DIAGNOSIS — Z794 Long term (current) use of insulin: Secondary | ICD-10-CM

## 2016-05-23 LAB — TSH: TSH: 1.26 u[IU]/mL (ref 0.35–4.50)

## 2016-05-23 LAB — GLUCOSE, RANDOM: Glucose, Bld: 66 mg/dL — ABNORMAL LOW (ref 70–99)

## 2016-05-24 LAB — FRUCTOSAMINE: Fructosamine: 190 umol/L (ref 0–285)

## 2016-05-25 ENCOUNTER — Other Ambulatory Visit: Payer: Self-pay | Admitting: Neurology

## 2016-05-25 ENCOUNTER — Other Ambulatory Visit: Payer: Self-pay | Admitting: Obstetrics & Gynecology

## 2016-05-25 DIAGNOSIS — Z79899 Other long term (current) drug therapy: Secondary | ICD-10-CM | POA: Diagnosis not present

## 2016-05-25 DIAGNOSIS — M5412 Radiculopathy, cervical region: Secondary | ICD-10-CM | POA: Diagnosis not present

## 2016-05-26 ENCOUNTER — Other Ambulatory Visit: Payer: Self-pay | Admitting: *Deleted

## 2016-05-26 MED ORDER — PYRIDOSTIGMINE BROMIDE 60 MG PO TABS: 60.0000 mg | ORAL_TABLET | Freq: Three times a day (TID) | ORAL | 3 refills | Status: DC

## 2016-05-26 NOTE — Telephone Encounter (Signed)
Patient is not supposed to be taking Imuran while she is pregnant, right?

## 2016-05-26 NOTE — Telephone Encounter (Signed)
Correct.  Imuran was discontinued. Mestinon is okay.

## 2016-05-29 ENCOUNTER — Encounter: Payer: Self-pay | Admitting: Endocrinology

## 2016-05-29 ENCOUNTER — Ambulatory Visit (INDEPENDENT_AMBULATORY_CARE_PROVIDER_SITE_OTHER): Payer: Medicare Other | Admitting: Endocrinology

## 2016-05-29 ENCOUNTER — Ambulatory Visit: Payer: Self-pay | Admitting: Endocrinology

## 2016-05-29 ENCOUNTER — Ambulatory Visit (INDEPENDENT_AMBULATORY_CARE_PROVIDER_SITE_OTHER): Payer: Medicare Other | Admitting: Obstetrics and Gynecology

## 2016-05-29 VITALS — BP 115/73 | HR 100 | Wt 256.7 lb

## 2016-05-29 VITALS — BP 108/66 | HR 94 | Ht 66.0 in | Wt 257.0 lb

## 2016-05-29 DIAGNOSIS — O99350 Diseases of the nervous system complicating pregnancy, unspecified trimester: Secondary | ICD-10-CM

## 2016-05-29 DIAGNOSIS — O09522 Supervision of elderly multigravida, second trimester: Secondary | ICD-10-CM

## 2016-05-29 DIAGNOSIS — E669 Obesity, unspecified: Secondary | ICD-10-CM

## 2016-05-29 DIAGNOSIS — Z794 Long term (current) use of insulin: Secondary | ICD-10-CM

## 2016-05-29 DIAGNOSIS — E1169 Type 2 diabetes mellitus with other specified complication: Secondary | ICD-10-CM

## 2016-05-29 DIAGNOSIS — O99212 Obesity complicating pregnancy, second trimester: Secondary | ICD-10-CM

## 2016-05-29 DIAGNOSIS — G7000 Myasthenia gravis without (acute) exacerbation: Secondary | ICD-10-CM

## 2016-05-29 DIAGNOSIS — O0992 Supervision of high risk pregnancy, unspecified, second trimester: Secondary | ICD-10-CM

## 2016-05-29 DIAGNOSIS — E1165 Type 2 diabetes mellitus with hyperglycemia: Secondary | ICD-10-CM | POA: Diagnosis not present

## 2016-05-29 LAB — POCT URINALYSIS DIP (DEVICE)
Bilirubin Urine: NEGATIVE
Glucose, UA: NEGATIVE mg/dL
Hgb urine dipstick: NEGATIVE
Ketones, ur: NEGATIVE mg/dL
Leukocytes, UA: NEGATIVE
Nitrite: NEGATIVE
Protein, ur: NEGATIVE mg/dL
Specific Gravity, Urine: 1.025 (ref 1.005–1.030)
Urobilinogen, UA: 0.2 mg/dL (ref 0.0–1.0)
pH: 5.5 (ref 5.0–8.0)

## 2016-05-29 MED ORDER — HUMALOG KWIKPEN 100 UNIT/ML ~~LOC~~ SOPN: PEN_INJECTOR | SUBCUTANEOUS | 5 refills | Status: DC

## 2016-05-29 MED ORDER — ACCU-CHEK SOFTCLIX LANCETS MISC: 1.0000 | Freq: Four times a day (QID) | 7 refills | Status: DC

## 2016-05-29 NOTE — Patient Instructions (Signed)
Increase Levemir to 20 units in AM Increase Humalog to 20 units with each meal Continue with Metformin Continue with Levemir 66 units at bedtime

## 2016-05-29 NOTE — Progress Notes (Signed)
Subjective:  Jade Mathis is a 41 y.o. [id number] at [redacted]w[redacted]d being seen today for ongoing prenatal care.  She is currently monitored for the following issues for this high-risk pregnancy and has HYPOTHYROIDISM, POSTSURGICAL; Diabetes mellitus type 2 in obese ([place]); OBESITY, NOS; DEPRESSION, MAJOR, RECURRENT, MODERATE; MIGRAINE HEADACHE; Myasthenia gravis ([place]); Asthma; GERD; OSA (obstructive sleep apnea); Pure hypercholesterolemia; Chronic pain syndrome; Stool incontinence; Ventral hernia; Preexisting diabetes complicating pregnancy, antepartum; Myasthenia gravis complicating pregnancy ([place]); Depression affecting pregnancy, antepartum; Hypothyroidism affecting pregnancy, antepartum; Advanced maternal age in multigravida; and Supervision of high-risk pregnancy on her problem list.  Patient reports no complaints.  Contractions: Irritability. Vag. Bleeding: None.  Movement: Absent. Denies leaking of fluid.   The following portions of the patient's history were reviewed and updated as appropriate: allergies, current medications, past family history, past medical history, past social history, past surgical history and problem list. Problem list updated.  Objective:   Vitals:   05/29/16 1124  BP: 115/73  Pulse: 100  Weight: 256 lb 11.2 oz (116.4 kg)    Fetal Status:     Movement: Absent     General:  Alert, oriented and cooperative. Patient is in no acute distress.  Skin: Skin is warm and dry. No rash noted.   Cardiovascular: Normal heart rate noted  Respiratory: Normal respiratory effort, no problems with respiration noted  Abdomen: Soft, gravid, appropriate for gestational age. Pain/Pressure: Present     Pelvic:  Cervical exam deferred        Extremities: Normal range of motion.  Edema: None  Mental Status: Normal mood and affect. Normal behavior. Normal judgment and thought content.   Urinalysis: Urine Protein: Negative Urine Glucose: Negative  Assessment and Plan:  Pregnancy: [id number] at  [redacted]w[redacted]d  1. Supervision of high risk pregnancy in second trimester Stable  2. Myasthenia gravis complicating pregnancy ([facility]) Stable Prednisone QAM  3. Diabetes mellitus type 2 in obese ([facility]) BS still are not in goal range Will continue with Metformin Increase Levemir 20 units in Am, continue with 66 units bedtime Increase Humalog to 20 units with each meal  4. Elderly multigravida in second trimester Panorama nl  Preterm labor symptoms and general obstetric precautions including but not limited to vaginal bleeding, contractions, leaking of fluid and fetal movement were reviewed in detail with the patient. Please refer to After Visit Summary for other counseling recommendations.  No Follow-up on file.   Chancy Milroy, MD

## 2016-05-29 NOTE — Progress Notes (Signed)
Patient reports migraine headaches that have worsened over the past 2 weeks. Reports dizziness & blurry vision with pressure in her eyes with headaches. Offered to patient to see Roselyn Reef today- patient declines

## 2016-05-29 NOTE — Patient Instructions (Addendum)
Avoid ginger ale  Levemir 62 units at nite and 20 in am  In 4 days if an sugar still over   More sugars after supper  Humalog SAME but try 22 at dinner for ethnic foods
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
## 2016-05-30 ENCOUNTER — Telehealth: Payer: Self-pay | Admitting: Endocrinology

## 2016-05-30 ENCOUNTER — Other Ambulatory Visit: Payer: Self-pay | Admitting: Neurology

## 2016-05-30 MED ORDER — HUMALOG KWIKPEN 100 UNIT/ML ~~LOC~~ SOPN: PEN_INJECTOR | SUBCUTANEOUS | 5 refills | Status: DC

## 2016-05-30 NOTE — Telephone Encounter (Signed)
Patient need refill of Humalog Quick Pen 66 units hs  22 units at night with meals

## 2016-05-30 NOTE — Telephone Encounter (Signed)
Refill submitted to [facility].

## 2016-06-01 ENCOUNTER — Telehealth: Payer: Self-pay

## 2016-06-01 ENCOUNTER — Inpatient Hospital Stay (HOSPITAL_COMMUNITY): Payer: Medicare Other

## 2016-06-01 ENCOUNTER — Inpatient Hospital Stay (HOSPITAL_COMMUNITY)
Admission: AD | Admit: 2016-06-01 | Discharge: 2016-06-01 | Disposition: A | Discharge status: Home / Self Care | Payer: Medicare Other | Source: Ambulatory Visit | Attending: Obstetrics and Gynecology | Admitting: Obstetrics and Gynecology

## 2016-06-01 ENCOUNTER — Encounter (HOSPITAL_COMMUNITY): Payer: Self-pay

## 2016-06-01 DIAGNOSIS — R51 Headache: Secondary | ICD-10-CM | POA: Diagnosis not present

## 2016-06-01 DIAGNOSIS — O26899 Other specified pregnancy related conditions, unspecified trimester: Secondary | ICD-10-CM

## 2016-06-01 DIAGNOSIS — R519 Headache, unspecified: Secondary | ICD-10-CM

## 2016-06-01 DIAGNOSIS — O09522 Supervision of elderly multigravida, second trimester: Secondary | ICD-10-CM | POA: Insufficient documentation

## 2016-06-01 DIAGNOSIS — O99212 Obesity complicating pregnancy, second trimester: Secondary | ICD-10-CM | POA: Insufficient documentation

## 2016-06-01 DIAGNOSIS — Z3A21 21 weeks gestation of pregnancy: Secondary | ICD-10-CM | POA: Diagnosis not present

## 2016-06-01 DIAGNOSIS — R109 Unspecified abdominal pain: Secondary | ICD-10-CM | POA: Insufficient documentation

## 2016-06-01 DIAGNOSIS — O24112 Pre-existing diabetes mellitus, type 2, in pregnancy, second trimester: Secondary | ICD-10-CM | POA: Diagnosis not present

## 2016-06-01 DIAGNOSIS — O26892 Other specified pregnancy related conditions, second trimester: Secondary | ICD-10-CM | POA: Diagnosis not present

## 2016-06-01 DIAGNOSIS — Z3492 Encounter for supervision of normal pregnancy, unspecified, second trimester: Secondary | ICD-10-CM

## 2016-06-01 LAB — COMPREHENSIVE METABOLIC PANEL
ALT: 12 U/L — ABNORMAL LOW (ref 14–54)
AST: 15 U/L (ref 15–41)
Albumin: 2.8 g/dL — ABNORMAL LOW (ref 3.5–5.0)
Alkaline Phosphatase: 59 U/L (ref 38–126)
Anion gap: 7 (ref 5–15)
BUN: 10 mg/dL (ref 6–20)
CO2: 24 mmol/L (ref 22–32)
Calcium: 8.7 mg/dL — ABNORMAL LOW (ref 8.9–10.3)
Chloride: 101 mmol/L (ref 101–111)
Creatinine, Ser: 0.57 mg/dL (ref 0.44–1.00)
GFR calc Af Amer: >60 mL/min (ref >60)
GFR calc non Af Amer: >60 mL/min (ref >60)
Glucose, Bld: 157 mg/dL — ABNORMAL HIGH (ref 65–99)
Potassium: 3.7 mmol/L (ref 3.5–5.1)
Sodium: 132 mmol/L — ABNORMAL LOW (ref 135–145)
Total Bilirubin: 0.1 mg/dL — ABNORMAL LOW (ref 0.3–1.2)
Total Protein: 6.7 g/dL (ref 6.5–8.1)

## 2016-06-01 LAB — URINALYSIS, ROUTINE W REFLEX MICROSCOPIC
Bilirubin Urine: NEGATIVE
Glucose, UA: 500 mg/dL — AB
Hgb urine dipstick: NEGATIVE
Ketones, ur: 15 mg/dL — AB
Leukocytes, UA: NEGATIVE
Nitrite: NEGATIVE
Protein, ur: NEGATIVE mg/dL
Specific Gravity, Urine: 1.02 (ref 1.005–1.030)
pH: 7 (ref 5.0–8.0)

## 2016-06-01 LAB — CBC WITH DIFFERENTIAL/PLATELET
Basophils Absolute: 0 10*3/uL (ref 0.0–0.1)
Basophils Relative: 0 %
Eosinophils Absolute: 0.2 10*3/uL (ref 0.0–0.7)
Eosinophils Relative: 2 %
HCT: 34.6 % — ABNORMAL LOW (ref 36.0–46.0)
Hemoglobin: 11.1 g/dL — ABNORMAL LOW (ref 12.0–15.0)
Lymphocytes Relative: 20 %
Lymphs Abs: 1.7 10*3/uL (ref 0.7–4.0)
MCH: 26.7 pg (ref 26.0–34.0)
MCHC: 32.1 g/dL (ref 30.0–36.0)
MCV: 83.2 fL (ref 78.0–100.0)
Monocytes Absolute: 0.5 10*3/uL (ref 0.1–1.0)
Monocytes Relative: 6 %
Neutro Abs: 6.2 10*3/uL (ref 1.7–7.7)
Neutrophils Relative %: 72 %
Platelets: 254 10*3/uL (ref 150–400)
RBC: 4.16 MIL/uL (ref 3.87–5.11)
RDW: 13.9 % (ref 11.5–15.5)
WBC: 8.5 10*3/uL (ref 4.0–10.5)

## 2016-06-01 LAB — LIPASE, BLOOD: Lipase: 11 U/L (ref 11–51)

## 2016-06-01 MED ORDER — METOCLOPRAMIDE HCL 5 MG/ML IJ SOLN
10.0000 mg | Freq: Once | INTRAMUSCULAR | Status: AC
  Administered 2016-06-01: 10 mg via INTRAVENOUS
  Filled 2016-06-01: qty 2

## 2016-06-01 MED ORDER — SODIUM CHLORIDE 0.9 % IV SOLN
INTRAVENOUS | Status: DC
Start: 2016-06-01 — End: 2016-06-01
  Administered 2016-06-01: 18:00:00 via INTRAVENOUS

## 2016-06-01 MED ORDER — DIPHENHYDRAMINE HCL 50 MG/ML IJ SOLN
25.0000 mg | Freq: Once | INTRAMUSCULAR | Status: AC
  Administered 2016-06-01: 25 mg via INTRAVENOUS
  Filled 2016-06-01: qty 1

## 2016-06-01 MED ORDER — DEXAMETHASONE SODIUM PHOSPHATE 10 MG/ML IJ SOLN
10.0000 mg | Freq: Once | INTRAMUSCULAR | Status: AC
  Administered 2016-06-01: 10 mg via INTRAVENOUS
  Filled 2016-06-01: qty 1

## 2016-06-01 NOTE — MAU Note (Signed)
Pt reports she statred having abd cramping  And back pain about 2 hours ago. Pt has had this pain before but not as intense as it is today. Also c/o headache. Has taken tylenol an ibuprofen with out releif .

## 2016-06-01 NOTE — Discharge Instructions (Signed)
Abdominal Pain During Pregnancy Abdominal pain is common in pregnancy. Most of the time, it does not cause harm. There are many causes of abdominal pain. Some causes are more serious than others and sometimes the cause is not known. Abdominal pain can be a sign that something is very wrong with the pregnancy or the pain may have nothing to do with the pregnancy. Always tell your health care provider if you have any abdominal pain. Follow these instructions at home:  Do not have sex or put anything in your vagina until your symptoms go away completely.  Watch your abdominal pain for any changes.  Get plenty of rest until your pain improves.  Drink enough fluid to keep your urine clear or pale yellow.  Take over-the-counter or prescription medicines only as told by your health care provider.  Keep all follow-up visits as told by your health care provider. This is important. Contact a health care provider if:  You have a fever.  Your pain gets worse or you have cramping.  Your pain continues after resting. Get help right away if:  You are bleeding, leaking fluid, or passing tissue from the vagina.  You have vomiting or diarrhea that does not go away.  You have painful or bloody urination.  You notice a decrease in your baby's movements.  You feel very weak or faint.  You have shortness of breath.  You develop a severe headache with abdominal pain.  You have abnormal vaginal discharge with abdominal pain. This information is not intended to replace advice given to you by your health care provider. Make sure you discuss any questions you have with your health care provider. Document Released: 03/06/2005 Document Revised: 12/16/2015 Document Reviewed: 10/03/2012 Elsevier Interactive Patient Education  2017 [place] Headache Without Cause A headache is pain or discomfort felt around the head or neck area. The specific cause of a headache may not be found. There are  many causes and types of headaches. A few common ones are:  Tension headaches.  Migraine headaches.  Cluster headaches.  Chronic daily headaches. Follow these instructions at home: Watch your condition for any changes. Take these steps to help with your condition: Managing pain   Take over-the-counter and prescription medicines only as told by your health care provider.  Lie down in a dark, quiet room when you have a headache.  If directed, apply ice to the head and neck area:  Put ice in a plastic bag.  Place a towel between your skin and the bag.  Leave the ice on for 20 minutes, 2-3 times per day.  Use a heating pad or hot shower to apply heat to the head and neck area as told by your health care provider.  Keep lights dim if bright lights bother you or make your headaches worse. Eating and drinking   Eat meals on a regular schedule.  Limit alcohol use.  Decrease the amount of caffeine you drink, or stop drinking caffeine. General instructions   Keep all follow-up visits as told by your health care provider. This is important.  Keep a headache journal to help find out what may trigger your headaches. For example, write down:  What you eat and drink.  How much sleep you get.  Any change to your diet or medicines.  Try massage or other relaxation techniques.  Limit stress.  Sit up straight, and do not tense your muscles.  Do not use tobacco products, including cigarettes, chewing tobacco, or  e-cigarettes. If you need help quitting, ask your health care provider.  Exercise regularly as told by your health care provider.  Sleep on a regular schedule. Get 7-9 hours of sleep, or the amount recommended by your health care provider. Contact a health care provider if:  Your symptoms are not helped by medicine.  You have a headache that is different from the usual headache.  You have nausea or you vomit.  You have a fever. Get help right away if:  Your  headache becomes severe.  You have repeated vomiting.  You have a stiff neck.  You have a loss of vision.  You have problems with speech.  You have pain in the eye or ear.  You have muscular weakness or loss of muscle control.  You lose your balance or have trouble walking.  You feel faint or pass out.  You have confusion. This information is not intended to replace advice given to you by your health care provider. Make sure you discuss any questions you have with your health care provider. Document Released: 03/06/2005 Document Revised: 08/12/2015 Document Reviewed: 06/29/2014 Elsevier Interactive Patient Education  2017 [employer].

## 2016-06-01 NOTE — Telephone Encounter (Signed)
Patient left a voice mail stating she is having some bad cramps. Called patient she stated she was at the [facility] being seen. Patient was upset that we did not call her back sooner. I explained to patient we got to her as soon we could due to the number of patient we see each day. Patient voice understanding.
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
## 2016-06-02 ENCOUNTER — Telehealth: Payer: Self-pay | Admitting: Neurology

## 2016-06-02 ENCOUNTER — Encounter: Payer: Self-pay | Admitting: Neurology

## 2016-06-02 DIAGNOSIS — G7000 Myasthenia gravis without (acute) exacerbation: Secondary | ICD-10-CM | POA: Diagnosis not present

## 2016-06-02 DIAGNOSIS — I10 Essential (primary) hypertension: Secondary | ICD-10-CM | POA: Diagnosis not present

## 2016-06-02 DIAGNOSIS — E119 Type 2 diabetes mellitus without complications: Secondary | ICD-10-CM | POA: Diagnosis not present

## 2016-06-02 DIAGNOSIS — G43709 Chronic migraine without aura, not intractable, without status migrainosus: Secondary | ICD-10-CM | POA: Diagnosis not present

## 2016-06-02 NOTE — Telephone Encounter (Signed)
Pt called and stated she was in the ER last night at [facility] .  She wants to get in to see Dr. Posey Pronto, stated it was urgent.  She is 22 weeks. pregnant  And is having a flare up with her myasthenias gravis. Please call back.

## 2016-06-02 NOTE — Telephone Encounter (Signed)
Called patient and she said she was at another appointment and would call me back.

## 2016-06-02 NOTE — Telephone Encounter (Signed)
Patient called back and left message for me to call her back.  Called her back and had to leave message on voicemail.

## 2016-06-05 ENCOUNTER — Ambulatory Visit (HOSPITAL_COMMUNITY)
Admission: RE | Admit: 2016-06-05 | Discharge: 2016-06-05 | Disposition: A | Discharge status: Home / Self Care | Payer: Medicare Other | Source: Ambulatory Visit | Attending: Obstetrics & Gynecology | Admitting: Obstetrics & Gynecology

## 2016-06-05 ENCOUNTER — Encounter (HOSPITAL_COMMUNITY): Payer: Self-pay

## 2016-06-05 ENCOUNTER — Other Ambulatory Visit (HOSPITAL_COMMUNITY): Payer: Self-pay | Admitting: Obstetrics and Gynecology

## 2016-06-05 DIAGNOSIS — O99212 Obesity complicating pregnancy, second trimester: Secondary | ICD-10-CM

## 2016-06-05 DIAGNOSIS — O99282 Endocrine, nutritional and metabolic diseases complicating pregnancy, second trimester: Secondary | ICD-10-CM | POA: Diagnosis not present

## 2016-06-05 DIAGNOSIS — O99280 Endocrine, nutritional and metabolic diseases complicating pregnancy, unspecified trimester: Secondary | ICD-10-CM

## 2016-06-05 DIAGNOSIS — Z0489 Encounter for examination and observation for other specified reasons: Secondary | ICD-10-CM

## 2016-06-05 DIAGNOSIS — O99350 Diseases of the nervous system complicating pregnancy, unspecified trimester: Secondary | ICD-10-CM

## 2016-06-05 DIAGNOSIS — Z3A22 22 weeks gestation of pregnancy: Secondary | ICD-10-CM | POA: Diagnosis not present

## 2016-06-05 DIAGNOSIS — O24112 Pre-existing diabetes mellitus, type 2, in pregnancy, second trimester: Secondary | ICD-10-CM | POA: Diagnosis not present

## 2016-06-05 DIAGNOSIS — IMO0002 Reserved for concepts with insufficient information to code with codable children: Secondary | ICD-10-CM

## 2016-06-05 DIAGNOSIS — E039 Hypothyroidism, unspecified: Secondary | ICD-10-CM | POA: Insufficient documentation

## 2016-06-05 DIAGNOSIS — O99340 Other mental disorders complicating pregnancy, unspecified trimester: Secondary | ICD-10-CM

## 2016-06-05 DIAGNOSIS — O09522 Supervision of elderly multigravida, second trimester: Secondary | ICD-10-CM | POA: Insufficient documentation

## 2016-06-05 DIAGNOSIS — O24319 Unspecified pre-existing diabetes mellitus in pregnancy, unspecified trimester: Secondary | ICD-10-CM

## 2016-06-05 DIAGNOSIS — G7000 Myasthenia gravis without (acute) exacerbation: Secondary | ICD-10-CM

## 2016-06-05 DIAGNOSIS — F329 Major depressive disorder, single episode, unspecified: Secondary | ICD-10-CM

## 2016-06-05 DIAGNOSIS — O0992 Supervision of high risk pregnancy, unspecified, second trimester: Secondary | ICD-10-CM

## 2016-06-05 DIAGNOSIS — F32A Depression, unspecified: Secondary | ICD-10-CM

## 2016-06-05 NOTE — Telephone Encounter (Signed)
Patient is scheduled to come in for a visit.

## 2016-06-06 ENCOUNTER — Other Ambulatory Visit (HOSPITAL_COMMUNITY): Payer: Self-pay | Admitting: *Deleted

## 2016-06-06 ENCOUNTER — Encounter: Payer: Self-pay | Admitting: Neurology

## 2016-06-06 ENCOUNTER — Ambulatory Visit (INDEPENDENT_AMBULATORY_CARE_PROVIDER_SITE_OTHER): Payer: Medicare Other | Admitting: Neurology

## 2016-06-06 VITALS — BP 120/70 | HR 98 | Ht 66.0 in | Wt 259.1 lb

## 2016-06-06 DIAGNOSIS — O24419 Gestational diabetes mellitus in pregnancy, unspecified control: Secondary | ICD-10-CM

## 2016-06-06 DIAGNOSIS — G7000 Myasthenia gravis without (acute) exacerbation: Secondary | ICD-10-CM

## 2016-06-06 NOTE — Progress Notes (Signed)
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   History of present illness: She was diagnosed with myasthenia gravis in 1995-07-09 with symptoms manifesting with weakness, double vision, ptosis, dysphagia and dysarthria. Diagnosis was made based on repetitive nerve stimulation and labs. The same year, she underwent thymectomy with post-operative period complicated by respiratory failure requiring ICU stay and plasmapharesis. Around the same time, prednisone and mestinon was initiated. At some point, she was started on azathioprine. Azathioprine was stopped in 2002/07/09 because of an unplanned pregnancy and she remained on prednisone 20mg  until 2009-07-08 when she had an exacerbation requiring out-patient PLEX. In July 08, 2009, Jade Mathis was restarted (dose unknown). She been on prednisone since 09-Jul-1995 and the lowest dose is her current dose at 10mg  daily. Cellcept was tried but she developed a rash so it was stopped.   The patient has previously been evaluated at [facility] by Dr. Truman Mathis 09-Jul-2010), discharged from [facility], and then established care with Jade Mathis at [facility] in April 2013. From 12/2011 - 07/2012, she was evaluated by Jade Mathis at [place] in [facility]. When she initially saw Jade Mathis in April 2013, she was only on prednisone 10mg  and mestinon 60mg  QID and demonstrated clinical signs of weakness, therefore Jade Mathis was restarted.  There was also a huge overlay of depression which started after her father's death in 2011-07-09 when he died within a month of being diagnosed with lung cancer diagnosis.  She sees psychiatry and behavorial therapist that is coming to her home.  She also started taking Jade Mathis 300mg  and vitamin B6 25mg  for positive PPD.    In Jul 09, 2014, she was doing well and able to reduce her prednisone to 20mg  every other day.  In June 2016, she was hospitalized for probable MG exacerbation associated with GI illness and completed 3 treatments of IVIG.  Patient continued to have persistent dysarthria so an additional 2-days of IVIG was completed at home in early July.   In 07-09-2015, she was doing great at her follow-up visit in May and down to prednisone 10mg  daily.  However, within a week she returned to the clinic with delusional thoughts, confusion, and depression.   She presented here on 08/26/2015 without a scheduled appointment demanding to have her blood checked for "poison".  She was very delusional, claiming her husband was having an affair and the woman was doing witchcraft - see telephone note for details. She also had unwanted pregnancy and had a miscarriage on 9/13.  She was also transfused for anemia due to acute blood loss. Since her D&C, she has not been feeling very tired. She occasionally gets choked on her food, but she denies any new double vision, ptosis, jaw fatigue, or shortness of breath.  Her azathioprine was stopped when she found out she was pregnancy and she, herself, restarted it last week after her D&C.    [facility] [place].    UPDATE 04/14/2016: She is here for her follow-up visit.  Since she was last here, she has become [redacted] weeks pregnant and appropriately discontinued Jade Mathis.  She takes prednisone 10mg  daily and mestinon 60mg  TID and has been doing well from  MG standpoint.  She denies any double vision, difficulty swallowing/talking, or limb weakness.  She is very tired today because she has difficulty sleeping comfortably and also is dealing with morning sickness.  No new neurological complaints.    UPDATE 06/06/2016:  Patient was  hospitalized last week with abodminal pain and cramping, and is doing better now.  Last week, she received a call for an attorney about some personal matters and she became very anxious, depressed, crying all day, and felt like her swallowing was getting more difficult.  She called to schedule a return visit because she felt that her MG was getting worse.  Today, she is looking great and denies any problems.  She is aware that her symptoms last week were due to stress.  She brings a letter from two of her therapists outlining her PTSD and how it affected her in 2016.  Patient requesting to have this scanned into her medical chart.  She denies any double vision, difficulty swallowing/talking, or weakness.  She is now 6 months pregnant with her daughter.    Medications:  Current Outpatient Prescriptions on File Prior to Visit  Medication Sig Dispense Refill  . ACCU-CHEK AVIVA PLUS test strip 1 each by Other route 3 (three) times daily. for testing 100 each 3  . ACCU-CHEK SOFTCLIX LANCETS lancets 1 EACH BY OTHER ROUTE THREE (THREE) TIMES DAILY. USE AS INSTRUCTED 100 each 1  . ACCU-CHEK SOFTCLIX LANCETS lancets 1 each by Other route 4 (four) times daily. Use as instructed 100 each 7  . albuterol (PROVENTIL) (2.5 MG/3ML) 0.083% nebulizer solution Take 3 mLs (2.5 mg total) by nebulization every 4 (four) hours as needed for wheezing or shortness of breath. 75 mL 6  . Butalbital-APAP-Caffeine 50-325-40 MG capsule TAKE 1 TO 2 CAPSULES BY MOUTH ONCE OR TWICE DAILY FOR SEVERE HEADACHE  0  . Doxylamine-Pyridoxine (DICLEGIS) 10-10 MG TBEC Take 2 tablets by mouth at bedtime. If symptoms persist, add one tablet in the morning and one in the afternoon 100 tablet 5  . feeding supplement, GLUCERNA SHAKE, (GLUCERNA SHAKE) LIQD Take 237 mLs by mouth 3 (three) times daily between meals. 90 Can 1  . fluticasone (FLOVENT HFA) 110 MCG/ACT inhaler Inhale 1 puff into the lungs 2 (two) times daily. 1 Inhaler 12  . HUMALOG KWIKPEN  100 UNIT/ML KiwkPen Use 18-22 units before each meal 30 mL 5  . insulin detemir (LEVEMIR) 100 unit/ml SOLN 18 units in the morning and 58 units at bedtime. 1 vial 5  . Insulin Syringes, Disposable, U-100 1 ML MISC 1 Device by Does not apply route as needed. 60 each 12  . levothyroxine (SYNTHROID, LEVOTHROID) 175 MCG tablet TAKE 1 TABLET (175 MCG TOTAL) BY MOUTH DAILY. 90 tablet 0  . metFORMIN (GLUCOPHAGE-XR) 500 MG 24 hr tablet Take 4 tablets (2,000 mg total) by mouth daily with supper. 120 tablet 3  . metoCLOPramide (REGLAN) 10 MG tablet TK 1 T PO Q 8 H PRN  2  . oxyCODONE (ROXICODONE) 15 MG immediate release tablet Take 15 mg by mouth 4 (four) times daily as needed for pain.    . pantoprazole (PROTONIX) 40 MG tablet Take 1 tablet (40 mg total) by mouth daily. 30 tablet 5  . predniSONE (DELTASONE) 10 MG tablet Take 10 mg by mouth daily with breakfast.     . Prenatal Vit-Fe Fum-FA-Omega (PRENATAL MULTI +DHA) 27-0.8-228 MG CAPS Take 1 capsule by mouth daily. 30 capsule 10  . PREVIDENT 5000 SENSITIVE 1.1-5 % PSTE  USE BEFORE BED. DO NOT RINSE  3  . promethazine (PHENERGAN) 25 MG tablet TAKE 1 TABLET EVERY 6 HOURS AS NEEDED FOR NAUSEA AND VOMITING  2  . pyridostigmine (MESTINON) 60 MG tablet Take 1 tablet (60 mg total) by mouth 3 (three) times daily. 270 tablet 3  . valACYclovir (VALTREX) 500 MG tablet Take 500 mg by mouth daily.     No current facility-administered medications on file prior to visit.     Allergies:  Allergies  Allergen Reactions  . Depo-Provera [Medroxyprogesterone] Other (See Comments)    Reaction:  Headaches   . Vicodin [Hydrocodone-Acetaminophen] Nausea Only    Review of Systems:  CONSTITUTIONAL: No fevers, chills, night sweats EYES: No visual changes or eye pain ENT: No hearing changes.  No history of nose bleeds.   RESPIRATORY: No cough, wheezing, shortness of breath.   CARDIOVASCULAR: No chest pain, and palpitations.   GI: Negative for abdominal discomfort, blood  in stools or black stools.      GU:  No history of incontinence.   MUSCLOSKELETAL: + joint pain or swelling.  No myalgias.   SKIN: Negative for lesions, rash, and itching.   HEMATOLOGY/ONCOLOGY: Negative for prolonged bleeding, bruising easily, and swollen nodes.  No history of cancer.   ENDOCRINE: Negative for cold or heat intolerance, polydipsia or goiter.   PSYCH:  + depression or anxiety symptoms.   NEURO: As Above.   Vital Signs:  BP 120/70   Pulse 98   Ht 5\' 6"  (1.676 m)   Wt 259 lb 1 oz (117.5 kg)   LMP 12/31/2015 (Exact Date)   SpO2 98%   BMI 41.81 kg/m   Neurological Exam: MENTAL STATUS:  Awake, oriented, cheerful and engages in conversation.  She laughs and tells me that she is expecting a baby gift from me when she visits in May.  Mood and affect is good.  CRANIAL NERVES: Pupils are round and reactive to light. Extraocular movements are intact. Face is symmetric.  There is no ptosis.  No facial weakness.  MOTOR:  Good motor strength today.  Motor strength is 5/5 UE and LLE, there is mild weakness of the RLE.  GAIT:  She is walking assisted with cane, slow slightly antalgic, mildly unstable.  Lab Results  Component Value Date   HGBA1C 6.2 04/24/2016   Lab Results  Component Value Date   TSH 1.26 05/23/2016     IMPRESSION: 1.  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    - Previously hospitalized for MG exacerbation in 1997 (PLEX), 2011 (PLEX), 2016 (IVIG)  - Continue prednisone 10mg  daily, and mestinon 60mg  TID   - She will stay off Jade Mathis for the duration of her pregnancy  - Being high risk pregnancy, I will see her again in May to be sure she is medically optimized from a myasthenia standpoint   2.  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  Chronic daily headaches, followed by Jade Mathis at [facility] in [place]  4.   PTSD and depression, followed by psychiatry and seeing behavior therapy.  Encouraged her to continue close follow-up with her counselor especially as she approaches her due date because it is so important to keep her psychologically and physically optimized during this time.  Return to clinic in 2 months   The duration of this appointment visit was 20 minutes of face-to-face time with the  patient.  Greater than 50% of this time was spent in counseling, explanation of diagnosis, planning of further management, and coordination of care.   Thank you for allowing me to participate in patient's care.  If I can answer any additional questions, I would be pleased to do so.    Sincerely,    Jade Bahri K. Posey Pronto, DO

## 2016-06-12 ENCOUNTER — Ambulatory Visit (INDEPENDENT_AMBULATORY_CARE_PROVIDER_SITE_OTHER): Payer: Medicare Other | Admitting: Obstetrics & Gynecology

## 2016-06-12 ENCOUNTER — Encounter: Payer: Self-pay | Admitting: Obstetrics & Gynecology

## 2016-06-12 VITALS — BP 113/82 | HR 98 | Wt 259.8 lb

## 2016-06-12 DIAGNOSIS — O99350 Diseases of the nervous system complicating pregnancy, unspecified trimester: Principal | ICD-10-CM

## 2016-06-12 DIAGNOSIS — O0992 Supervision of high risk pregnancy, unspecified, second trimester: Secondary | ICD-10-CM

## 2016-06-12 DIAGNOSIS — G4733 Obstructive sleep apnea (adult) (pediatric): Secondary | ICD-10-CM

## 2016-06-12 DIAGNOSIS — G7000 Myasthenia gravis without (acute) exacerbation: Secondary | ICD-10-CM

## 2016-06-12 DIAGNOSIS — O99282 Endocrine, nutritional and metabolic diseases complicating pregnancy, second trimester: Secondary | ICD-10-CM

## 2016-06-12 DIAGNOSIS — O24319 Unspecified pre-existing diabetes mellitus in pregnancy, unspecified trimester: Secondary | ICD-10-CM

## 2016-06-12 MED ORDER — INSULIN DETEMIR 100 UNIT/ML FLEXPEN: SUBCUTANEOUS | 5 refills | Status: DC

## 2016-06-12 NOTE — Progress Notes (Addendum)
     PRENATAL VISIT NOTE  Subjective:  Jade Mathis is a 41 y.o. [phone] at [redacted]w[redacted]d being seen today for ongoing prenatal care.  She is currently monitored for the following issues for this high-risk pregnancy and has HYPOTHYROIDISM, POSTSURGICAL; Diabetes mellitus type 2 in obese ([place]); OBESITY, NOS; DEPRESSION, MAJOR, RECURRENT, MODERATE; MIGRAINE HEADACHE; Myasthenia gravis ([place]); Asthma; GERD; OSA (obstructive sleep apnea); Pure hypercholesterolemia; Fibromyalgia; Stool incontinence; Ventral hernia; Preexisting diabetes complicating pregnancy, antepartum; Myasthenia gravis complicating pregnancy ([place]); Depression affecting pregnancy, antepartum; Hypothyroidism affecting pregnancy, antepartum; Advanced maternal age in multigravida; and Supervision of high-risk pregnancy on her problem list.  Patient reports feeling very tired.  Contractions: Not present. Vag. Bleeding: None.   . Denies leaking of fluid.   The following portions of the patient's history were reviewed and updated as appropriate: allergies, current medications, past family history, past medical history, past social history, past surgical history and problem list. Problem list updated.  Objective:   Vitals:   06/12/16 0844  BP: 113/82  Pulse: 98  Weight: 259 lb 12.8 oz (117.8 kg)    Fetal Status: Fetal Heart Rate (bpm): 141         General:  Alert, oriented and cooperative. Patient is in no acute distress.  Skin: Skin is warm and dry. No rash noted.   Cardiovascular: Normal heart rate noted  Respiratory: Normal respiratory effort, no problems with respiration noted  Abdomen: Soft, gravid, appropriate for gestational age. Pain/Pressure: Present     Pelvic:  Cervical exam deferred        Extremities: Normal range of motion.  Edema: Trace  Mental Status: Normal mood and affect. Normal behavior. Normal judgment and thought content.   Assessment and Plan:  Pregnancy: [id number] at [redacted]w[redacted]d  1. Preexisting diabetes complicating  pregnancy, antepartum Fasting are still elevated.  Will increase night time Levemir by 10%. - insulin detemir (LEVEMIR) 100 unit/ml SOLN; 20 units in the morning and 72 units at bedtime.  Dispense: 1 vial; Refill: 5  2. Supervision of high risk pregnancy in second trimester -Serial growth [place] with MFM  3. Myasthenia gravis complicating pregnancy ([place]) -Saw her physician recently (note in epic) and has follow up appt in 1-2 months  4. OSA (obstructive sleep apnea) -Pt doesn't have CPAP; I am worried her OSA is worsening and leading to her hypersomlence.  Refer to pulmonology. - Ambulatory referral to Pulmonology  5.  Hernia Not a candidate for post partum BTL (current failed mesh; pt want IUD.  6.  Hypothyroid TSH nml in February  7.  Depression 3/29--seeing psych to get back on medications for bipolar   Preterm labor symptoms and general obstetric precautions including but not limited to vaginal bleeding, contractions, leaking of fluid and fetal movement were reviewed in detail with the patient. Please refer to After Visit Summary for other counseling recommendations.  Return in about 1 week (around 06/19/2016).   Guss Bunde, MD

## 2016-06-13 ENCOUNTER — Encounter (HOSPITAL_COMMUNITY): Payer: Self-pay | Admitting: *Deleted

## 2016-06-13 ENCOUNTER — Observation Stay (HOSPITAL_COMMUNITY)
Admission: AD | Admit: 2016-06-13 | Discharge: 2016-06-14 | Disposition: A | Discharge status: Home / Self Care | Payer: Medicare Other | Source: Ambulatory Visit | Attending: Obstetrics and Gynecology | Admitting: Obstetrics and Gynecology

## 2016-06-13 DIAGNOSIS — Z3A23 23 weeks gestation of pregnancy: Secondary | ICD-10-CM | POA: Insufficient documentation

## 2016-06-13 DIAGNOSIS — Z794 Long term (current) use of insulin: Secondary | ICD-10-CM | POA: Insufficient documentation

## 2016-06-13 DIAGNOSIS — J45909 Unspecified asthma, uncomplicated: Secondary | ICD-10-CM | POA: Insufficient documentation

## 2016-06-13 DIAGNOSIS — Z79899 Other long term (current) drug therapy: Secondary | ICD-10-CM | POA: Diagnosis not present

## 2016-06-13 DIAGNOSIS — O24912 Unspecified diabetes mellitus in pregnancy, second trimester: Principal | ICD-10-CM | POA: Insufficient documentation

## 2016-06-13 DIAGNOSIS — R531 Weakness: Secondary | ICD-10-CM | POA: Diagnosis not present

## 2016-06-13 LAB — CBC
HCT: 31.6 % — ABNORMAL LOW (ref 36.0–46.0)
Hemoglobin: 10.4 g/dL — ABNORMAL LOW (ref 12.0–15.0)
MCH: 27.2 pg (ref 26.0–34.0)
MCHC: 32.9 g/dL (ref 30.0–36.0)
MCV: 82.7 fL (ref 78.0–100.0)
Platelets: 252 10*3/uL (ref 150–400)
RBC: 3.82 MIL/uL — ABNORMAL LOW (ref 3.87–5.11)
RDW: 13.8 % (ref 11.5–15.5)
WBC: 9.1 10*3/uL (ref 4.0–10.5)

## 2016-06-13 LAB — URINALYSIS, COMPLETE (UACMP) WITH MICROSCOPIC
Bilirubin Urine: NEGATIVE
Glucose, UA: >=500 mg/dL — AB
Hgb urine dipstick: NEGATIVE
Ketones, ur: NEGATIVE mg/dL
Leukocytes, UA: NEGATIVE
Nitrite: NEGATIVE
Protein, ur: NEGATIVE mg/dL
Specific Gravity, Urine: 1.018 (ref 1.005–1.030)
pH: 5 (ref 5.0–8.0)

## 2016-06-13 LAB — GLUCOSE, CAPILLARY: Glucose-Capillary: 119 mg/dL — ABNORMAL HIGH (ref 65–99)

## 2016-06-13 MED ORDER — CALCIUM CARBONATE ANTACID 500 MG PO CHEW: 2.0000 | CHEWABLE_TABLET | ORAL | Status: DC | PRN

## 2016-06-13 MED ORDER — ZOLPIDEM TARTRATE 5 MG PO TABS
5.0000 mg | ORAL_TABLET | Freq: Every evening | ORAL | Status: DC | PRN
Start: 2016-06-13 — End: 2016-06-14

## 2016-06-13 MED ORDER — SODIUM CHLORIDE 0.9% FLUSH
3.0000 mL | Freq: Two times a day (BID) | INTRAVENOUS | Status: DC
  Administered 2016-06-14 (×2): 3 mL via INTRAVENOUS

## 2016-06-13 MED ORDER — PRENATAL MULTIVITAMIN CH
1.0000 | ORAL_TABLET | Freq: Every day | ORAL | Status: DC
  Administered 2016-06-14: 1 via ORAL
  Filled 2016-06-13: qty 1

## 2016-06-13 MED ORDER — SODIUM CHLORIDE 0.9 % IV SOLN: 250.0000 mL | INTRAVENOUS | Status: DC | PRN

## 2016-06-13 MED ORDER — SODIUM CHLORIDE 0.9% FLUSH
3.0000 mL | INTRAVENOUS | Status: DC | PRN
  Administered 2016-06-14: 3 mL via INTRAVENOUS
  Filled 2016-06-13: qty 3

## 2016-06-13 MED ORDER — DOCUSATE SODIUM 100 MG PO CAPS
100.0000 mg | ORAL_CAPSULE | Freq: Every day | ORAL | Status: DC
  Administered 2016-06-14: 100 mg via ORAL
  Filled 2016-06-13: qty 1
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
## 2016-06-14 ENCOUNTER — Ambulatory Visit: Payer: Self-pay | Admitting: Endocrinology

## 2016-06-14 ENCOUNTER — Other Ambulatory Visit: Payer: Self-pay | Admitting: Neurology

## 2016-06-14 ENCOUNTER — Telehealth: Payer: Self-pay | Admitting: Endocrinology

## 2016-06-14 DIAGNOSIS — Z794 Long term (current) use of insulin: Secondary | ICD-10-CM | POA: Diagnosis not present

## 2016-06-14 DIAGNOSIS — J45909 Unspecified asthma, uncomplicated: Secondary | ICD-10-CM | POA: Diagnosis not present

## 2016-06-14 DIAGNOSIS — Z79899 Other long term (current) drug therapy: Secondary | ICD-10-CM | POA: Diagnosis not present

## 2016-06-14 DIAGNOSIS — O24912 Unspecified diabetes mellitus in pregnancy, second trimester: Secondary | ICD-10-CM | POA: Diagnosis not present

## 2016-06-14 DIAGNOSIS — R531 Weakness: Secondary | ICD-10-CM | POA: Diagnosis not present

## 2016-06-14 LAB — COMPREHENSIVE METABOLIC PANEL
ALT: 10 U/L — ABNORMAL LOW (ref 14–54)
AST: 14 U/L — ABNORMAL LOW (ref 15–41)
Albumin: 2.8 g/dL — ABNORMAL LOW (ref 3.5–5.0)
Alkaline Phosphatase: 59 U/L (ref 38–126)
Anion gap: 6 (ref 5–15)
BUN: 12 mg/dL (ref 6–20)
CO2: 24 mmol/L (ref 22–32)
Calcium: 8.4 mg/dL — ABNORMAL LOW (ref 8.9–10.3)
Chloride: 105 mmol/L (ref 101–111)
Creatinine, Ser: 0.41 mg/dL — ABNORMAL LOW (ref 0.44–1.00)
GFR calc Af Amer: >60 mL/min (ref >60)
GFR calc non Af Amer: >60 mL/min (ref >60)
Glucose, Bld: 117 mg/dL — ABNORMAL HIGH (ref 65–99)
Potassium: 3.6 mmol/L (ref 3.5–5.1)
Sodium: 135 mmol/L (ref 135–145)
Total Bilirubin: 0.3 mg/dL (ref 0.3–1.2)
Total Protein: 6.6 g/dL (ref 6.5–8.1)

## 2016-06-14 LAB — GLUCOSE, CAPILLARY
Glucose-Capillary: 145 mg/dL — ABNORMAL HIGH (ref 65–99)
Glucose-Capillary: 165 mg/dL — ABNORMAL HIGH (ref 65–99)
Glucose-Capillary: 193 mg/dL — ABNORMAL HIGH (ref 65–99)
Glucose-Capillary: 120 mg/dL — ABNORMAL HIGH (ref 65–99)

## 2016-06-14 LAB — CBC
HCT: 31.7 % — ABNORMAL LOW (ref 36.0–46.0)
Hemoglobin: 10.5 g/dL — ABNORMAL LOW (ref 12.0–15.0)
MCH: 27.4 pg (ref 26.0–34.0)
MCHC: 33.1 g/dL (ref 30.0–36.0)
MCV: 82.8 fL (ref 78.0–100.0)
Platelets: 227 10*3/uL (ref 150–400)
RBC: 3.83 MIL/uL — ABNORMAL LOW (ref 3.87–5.11)
RDW: 14 % (ref 11.5–15.5)
WBC: 7.6 10*3/uL (ref 4.0–10.5)

## 2016-06-14 LAB — RAPID URINE DRUG SCREEN, HOSP PERFORMED
Amphetamines: NOT DETECTED
Barbiturates: NOT DETECTED
Benzodiazepines: NOT DETECTED
Cocaine: NOT DETECTED
Opiates: NOT DETECTED
Tetrahydrocannabinol: NOT DETECTED

## 2016-06-14 LAB — PROTEIN / CREATININE RATIO, URINE
Creatinine, Urine: 87 mg/dL
Protein Creatinine Ratio: 0.08 mg/mg{Cre} (ref 0.00–0.15)
Total Protein, Urine: 7 mg/dL

## 2016-06-14 LAB — LACTATE DEHYDROGENASE: LDH: 88 U/L — ABNORMAL LOW (ref 98–192)

## 2016-06-14 LAB — URIC ACID: Uric Acid, Serum: 3.6 mg/dL (ref 2.3–6.6)

## 2016-06-14 MED ORDER — METFORMIN HCL ER 750 MG PO TB24
2000.0000 mg | ORAL_TABLET | Freq: Every day | ORAL | Status: DC
  Filled 2016-06-14: qty 1

## 2016-06-14 MED ORDER — INSULIN ASPART 100 UNIT/ML ~~LOC~~ SOLN: 0.0000 [IU] | Freq: Four times a day (QID) | SUBCUTANEOUS | 11 refills | Status: DC

## 2016-06-14 MED ORDER — INSULIN DETEMIR 100 UNIT/ML ~~LOC~~ SOLN
20.0000 [IU] | Freq: Every morning | SUBCUTANEOUS | Status: DC
  Administered 2016-06-14: 20 [IU] via SUBCUTANEOUS
  Filled 2016-06-14 (×2): qty 0.2

## 2016-06-14 MED ORDER — INSULIN DETEMIR 100 UNIT/ML ~~LOC~~ SOLN
72.0000 [IU] | Freq: Every day | SUBCUTANEOUS | Status: DC
  Filled 2016-06-14: qty 0.72

## 2016-06-14 MED ORDER — HUMALOG KWIKPEN 100 UNIT/ML ~~LOC~~ SOPN: PEN_INJECTOR | SUBCUTANEOUS | 5 refills | Status: DC

## 2016-06-14 MED ORDER — INSULIN ASPART 100 UNIT/ML ~~LOC~~ SOLN
12.0000 [IU] | Freq: Three times a day (TID) | SUBCUTANEOUS | Status: DC
  Administered 2016-06-14: 12 [IU] via SUBCUTANEOUS

## 2016-06-14 MED ORDER — INSULIN ASPART 100 UNIT/ML ~~LOC~~ SOLN: 12.0000 [IU] | Freq: Three times a day (TID) | SUBCUTANEOUS | 11 refills | Status: DC

## 2016-06-14 MED ORDER — ACETAMINOPHEN-CODEINE #3 300-30 MG PO TABS
1.0000 | ORAL_TABLET | Freq: Once | ORAL | Status: AC
  Administered 2016-06-14: 1 via ORAL
  Filled 2016-06-14: qty 2

## 2016-06-14 MED ORDER — INSULIN ASPART 100 UNIT/ML ~~LOC~~ SOLN
0.0000 [IU] | Freq: Four times a day (QID) | SUBCUTANEOUS | Status: DC
  Administered 2016-06-14: 4 [IU] via SUBCUTANEOUS
  Administered 2016-06-14: 8 [IU] via SUBCUTANEOUS

## 2016-06-14 MED ORDER — BUTALBITAL-APAP-CAFFEINE 50-325-40 MG PO TABS
1.0000 | ORAL_TABLET | ORAL | Status: DC | PRN
  Administered 2016-06-14: 1 via ORAL
  Filled 2016-06-14: qty 1

## 2016-06-14 NOTE — Telephone Encounter (Signed)
Patient had to cancel, she was put in the hospital today b/s running to high.

## 2016-06-14 NOTE — Discharge Summary (Signed)
OB Discharge Summary     Patient Name: Jade Mathis DOB: 06/04/1975 MRN: [id number]  Date of admission: 06/13/2016 Delivering MD: This patient has no babies on file.  Date of discharge: 06/14/2016  Admitting diagnosis: 23w direct admit, migrane headaches Intrauterine pregnancy: [redacted]w[redacted]d     Secondary diagnosis:  Active Problems:   Diabetes mellitus affecting pregnancy, second trimester  Additional problems:myasthenia gravis, hypothyroid, OSA, bipolar/depression     Discharge diagnosis: Insulin Controlled DM, 23+ wk preg                                                                                                Post partum procedures:N/A  Augmentation: N/A  Complications: seeing multiple OB providers  Hospital course:  Admitted with poorly controlled sugars and malaise - in admission w/u noted that pt had been concurrently seeing both Pharmacist, community.  Pt also a pt at Dr Ronnie Derby office (endocrine)  Pt seen by diabetic educator.  Regimen clarified.  D/C to home with Levemir, Novolog with meals and SSI with Novolog.  Has f/u scheduled with faculty practice.    Physical exam  Vitals:   06/14/16 0600 06/14/16 0750 06/14/16 1133 06/14/16 1629  BP: 111/60 107/61 109/69 122/65  Pulse: (!) 104 (!) 103 (!) 101 100  Resp: 18 18 18 18   Temp:   98 F (36.7 C) 98.3 F (36.8 C)  TempSrc:   Oral Oral  SpO2: 98% 100% 100% 100%  Weight:   117.5 kg (259 lb)   Height:   5\' 6"  (1.676 m)    General: no distress  Labs: Lab Results  Component Value Date   WBC 7.6 06/14/2016   HGB 10.5 (L) 06/14/2016   HCT 31.7 (L) 06/14/2016   MCV 82.8 06/14/2016   PLT 227 06/14/2016   CMP Latest Ref Rng & Units 06/13/2016  Glucose 65 - 99 mg/dL 117(H)  BUN 6 - 20 mg/dL 12  Creatinine 0.44 - 1.00 mg/dL 0.41(L)  Sodium 135 - 145 mmol/L 135  Potassium 3.5 - 5.1 mmol/L 3.6  Chloride 101 - 111 mmol/L 105  CO2 22 - 32 mmol/L 24  Calcium 8.9 - 10.3 mg/dL 8.4(L)  Total  Protein 6.5 - 8.1 g/dL 6.6  Total Bilirubin 0.3 - 1.2 mg/dL 0.3  Alkaline Phos 38 - 126 U/L 59  AST 15 - 41 U/L 14(L)  ALT 14 - 54 U/L 10(L)    Discharge instruction: per After Visit Summary and "Baby and Me Booklet".  After visit meds:  Allergies as of 06/14/2016      Reactions   Depo-provera [medroxyprogesterone] Other (See Comments)   Reaction:  Headaches    Vicodin [hydrocodone-acetaminophen] Nausea Only      Medication List    TAKE these medications   ACCU-CHEK AVIVA PLUS test strip Generic drug:  glucose blood 1 each by Other route 3 (three) times daily. for testing   ACCU-CHEK SOFTCLIX LANCETS lancets 1 each by Other route 4 (four) times daily. Use as instructed   ACCU-CHEK SOFTCLIX LANCETS lancets 1 EACH BY OTHER ROUTE THREE (THREE) TIMES DAILY. USE  AS INSTRUCTED   albuterol (2.5 MG/3ML) 0.083% nebulizer solution Commonly known as:  PROVENTIL Take 3 mLs (2.5 mg total) by nebulization every 4 (four) hours as needed for wheezing or shortness of breath.   butalbital-acetaminophen-caffeine 50-325-40 MG tablet Commonly known as:  FIORICET, ESGIC Take 1-2 tablets by mouth 2 (two) times daily as needed for headache.   Doxylamine-Pyridoxine 10-10 MG Tbec Commonly known as:  DICLEGIS Take 2 tablets by mouth at bedtime. If symptoms persist, add one tablet in the morning and one in the afternoon   feeding supplement (GLUCERNA SHAKE) Liqd Take 237 mLs by mouth 3 (three) times daily between meals.   fluticasone 110 MCG/ACT inhaler Commonly known as:  FLOVENT HFA Inhale 1 puff into the lungs 2 (two) times daily.   HUMALOG KWIKPEN 100 UNIT/ML KiwkPen Generic drug:  insulin lispro Use 12 units before each meal What changed:  additional instructions   insulin aspart 100 UNIT/ML injection Commonly known as:  novoLOG Inject 12 Units into the skin 3 (three) times daily with meals.   insulin aspart 100 UNIT/ML injection Commonly known as:  novoLOG Inject 0-32 Units into  the skin 4 (four) times daily.   insulin detemir 100 unit/ml Soln Commonly known as:  LEVEMIR 20 units in the morning and 72 units at bedtime.   Insulin Syringes (Disposable) U-100 1 ML Misc 1 Device by Does not apply route as needed.   levothyroxine 175 MCG tablet Commonly known as:  SYNTHROID, LEVOTHROID TAKE 1 TABLET (175 MCG TOTAL) BY MOUTH DAILY.   metFORMIN 500 MG 24 hr tablet Commonly known as:  GLUCOPHAGE-XR Take 4 tablets (2,000 mg total) by mouth daily with supper.   oxyCODONE 15 MG immediate release tablet Commonly known as:  ROXICODONE Take 15 mg by mouth 4 (four) times daily as needed for pain.   pantoprazole 40 MG tablet Commonly known as:  PROTONIX Take 1 tablet (40 mg total) by mouth daily.   predniSONE 10 MG tablet Commonly known as:  DELTASONE Take 10 mg by mouth daily with breakfast.   PRENATAL MULTI +DHA 27-0.8-228 MG Caps Take 1 capsule by mouth daily.   PREVIDENT 5000 SENSITIVE 1.1-5 % Pste Generic drug:  Sod Fluoride-Potassium Nitrate USE BEFORE BED. DO NOT RINSE   pyridostigmine 60 MG tablet Commonly known as:  MESTINON Take 1 tablet (60 mg total) by mouth 3 (three) times daily.   valACYclovir 500 MG tablet Commonly known as:  VALTREX Take 500 mg by mouth daily.       Diet: carb modified diet  Activity: Advance as tolerated. Pelvic rest for 6 weeks.   To call [facility]'s office for sooner appointment  Follow up Appt:Future Appointments Date Time Provider [place]  06/19/2016 3:40 PM Truett Mainland, DO [place]  07/03/2016 9:30 AM [facility]-MFC [place] 1 WH-MFCUS MFC-US  07/10/2016 9:30 AM [facility] LAB [facility] None  07/17/2016 9:15 AM Elayne Snare, MD [facility] None  07/24/2016 2:30 PM Chesley Mires, MD [facility]UL[facility] None  08/16/2016 8:00 AM Arvin Collard Keith Rake, DO LBN-LBNG None   Follow up Visit:as above   06/14/2016 Janyth Contes, MD

## 2016-06-14 NOTE — Telephone Encounter (Signed)
Discussed with her obstetrician.  Her blood sugars had been running 150-180 fasting but she is getting discharged today. Please change her appointment to about 2 weeks from now instead of April 30

## 2016-06-14 NOTE — Progress Notes (Signed)
Patient requested to see MD before leaving, Dr Elonda Husky will not see her until she is established in the clinic.  Patient refused to listen to discharge instructions stating "I can read."  RN still tried to give verbal instructions.  Patient seemed very dismissive to RN.  She refuses escort out.  Patient left floor with husband and son.

## 2016-06-14 NOTE — Progress Notes (Signed)
Patient called Dr Dwyane Dee and she was told he does not come to [facility].  I call his office also and was told the same.  Dr Melba Coon is aware.  Dr Dwyane Dee was given Dr Roe Rutherford phone number to call.  Patient is getting frustrated she was calmed down.

## 2016-06-14 NOTE — Progress Notes (Addendum)
Inpatient Diabetes Program Recommendations  Diabetes Treatment Program Recommendations  ADA Standards of Care 2017 Diabetes in Pregnancy Target Glucose Ranges:  Fasting: 60 - 90 mg/dL Preprandial: 60 - 105 mg/dL 1 hr postprandial: Less than 140mg /dL (from first bite of meal) 2 hr postprandial: Less than 120 mg/dL (from first bit of meal)   Lab Results  Component Value Date   GLUCAP 145 (H) 06/14/2016   HGBA1C 6.2 04/24/2016    Review of Glycemic Control  Diabetes history: DM2 Outpatient Diabetes medications: Levemir 20 units am + 72 units hs + Humalog 18-22 units tid meal coverage + Metformin XR 2 gm supper Current orders for Inpatient glycemic control: None  Inpatient Diabetes Program Recommendations:  Noted patient last saw Dr. Dwyane Dee 05/29/16; A1C 6.2% and noted in MD plans to consult Dr. Dwyane Dee today for insulin recommendations. Spoke with patient by phone and states she has been going to the high risk clinic weekly and has been increasing her insulin each visit. Please consider; -Levemir 20 units am + 72 units hs -Novolog 12 units tid meal coverage -Metformin 2 gm with supper - Use Pregnant patient order set Novolog correction qid (fasting and 2 hr post prandial) (117.7 kg) -Consult Dr. Dwyane Dee for rec  9:43 Spoke with Dr. Melba Coon regarding recommendations.  Thank you, Nani Gasser. Dreux Mcgroarty, RN, MSN, CDE Inpatient Glycemic Control Team Team Pager [phone] (8am-5pm) 06/14/2016 8:16 AM

## 2016-06-14 NOTE — Progress Notes (Signed)
Patient ID: Jade Mathis, female   DOB: 12/14/1975, 41 y.o.   MRN: [id number]  Late entry note:  I spoke to pt both in person and via follow up phone call to review her ongoing prenatal care.   Pt admitted for management of symptoms thought to be due to uncontrolled blood sugars. Since admission, her blood sugars have been elevated but not as high as reported in her log during her office visit Pending consult with Dr Dwyane Dee to get advice on best ongoing regimen . Pt understands that once she is deemed to be stable for outpt mgmt she will be discharged to home  I then addressed with pt the problem of her receiving care with two different OBGYN groups for the duration of this pregnancy. Pt admits she was doing this to ensure "if one group missed something the other would maybe pick it up". This she states was due to her missed abortion a year ago ( while under the care of [facility]). She states she had called [facility] yesterday to establish care with Dr Garwin Brothers due to being frustrated with getting varying advice on her glucose management from both [place] and faculty group.  I explained to pt first of all that receiving care from two different practices is inadvisable for several reasons, one of which is the differing advice she may be receiving thereby increasing her frustration. I asked pt to consider which practice would best suit her health care needs as she continued with her pregnancy. I reviewed options with her clearly as follows: continue with [facility] ob[facility] vs Faculty practice ; NOT BOTH. Pt asked to be given time to discuss with her husband. I called pt back after an hour for her answer and she stated she was interested in the group that would best cater to her high risk status; whichever group that would be. I then discussed with Dr Willis Modena who reminded me he had agreed to pt request to transfer care yesterday albeit to [facility]. Pending tests ordered my our group at this time is  a fetal ECHO which has apparently already been ordered and done by faculty group ( as of 05/19/16).  Based on this is it the decision of [facility] that we are not offering pt any service that is different from that being offered by the faculty group and so pending stabilization of pt during this hospitalization, we will defer her remaining care to the Faculty Group upon discharge. Pt already has her next prenatal appointment scheduled with the Faculty group I discussed this decision with Dr Ihor Dow and she agreed that [facility] would ensure pt care during this hospitalization and on discharge we would withdraw care so pt can continue her care with Faculty to ensure a smooth transition for both pt and physicians Pending recommendation of endocrine, pt will be managed and discharged accordingly C.Earnestine Tuohey DO

## 2016-06-14 NOTE — Telephone Encounter (Signed)
Pt called in and wanted to know what time Dr. Dwyane Dee would be coming to [facility] to see her there.

## 2016-06-14 NOTE — Progress Notes (Addendum)
Patient ID: Jade Mathis, female   DOB: [birth year], 40 y.o.   MRN: [id number]   D/W pt to follow up with faculty.  Will d/c to faculty has appointment 06/19/16 at 3:40 with Dr Nehemiah Settle  D/w pt current regimen with Levemir 20unit qam, 72units Qpm, Novolog 12 u with meals and SSI at 2 hr PP Also glucophage XR 2000mg  qday  Also spoke with Dr Dwyane Dee - Will have short term f/u with them to call in AM for appointment and Dr Purvis Kilts  Pt amenable to plan.

## 2016-06-14 NOTE — Progress Notes (Signed)
Patient ID: Jade Mathis, female   DOB: Nov 25, 1975, 41 y.o.   MRN: [id number] Pt declines tylenol for headache. She would like "the iv medication I got last time I was here" . I checked pt prior meds with pharmacy. Pt received decadron/reglan on one of her visits and fioricet on several other visits. I ordered fioricet over first med due to risk of steroid affecting her BS reads.  Of note glucose was 117 when checked.  Will continue to monitor at this time and assess meds needed for control in am Pt has prescribed on metformin 2000mg  in divided doses and levemir 70units as well as humalog 24units.  Will also check a UDS at this time

## 2016-06-14 NOTE — Telephone Encounter (Signed)
Dr. Kara Mead [facility] is asking for a call back please from Dr. Dwyane Dee # [phone]

## 2016-06-14 NOTE — Telephone Encounter (Signed)
Dr Dwyane Dee is unable to see pt 's at [facility] hospital, pt is aware

## 2016-06-15 ENCOUNTER — Encounter: Payer: Self-pay | Admitting: Neurology

## 2016-06-15 ENCOUNTER — Encounter: Payer: Self-pay | Admitting: *Deleted

## 2016-06-15 ENCOUNTER — Ambulatory Visit (INDEPENDENT_AMBULATORY_CARE_PROVIDER_SITE_OTHER): Payer: Medicare Other | Admitting: Advanced Practice Midwife

## 2016-06-15 VITALS — BP 107/77 | HR 98 | Wt 259.1 lb

## 2016-06-15 DIAGNOSIS — O24312 Unspecified pre-existing diabetes mellitus in pregnancy, second trimester: Secondary | ICD-10-CM

## 2016-06-15 DIAGNOSIS — G43909 Migraine, unspecified, not intractable, without status migrainosus: Secondary | ICD-10-CM

## 2016-06-15 DIAGNOSIS — O0992 Supervision of high risk pregnancy, unspecified, second trimester: Secondary | ICD-10-CM

## 2016-06-15 DIAGNOSIS — O24319 Unspecified pre-existing diabetes mellitus in pregnancy, unspecified trimester: Secondary | ICD-10-CM

## 2016-06-15 DIAGNOSIS — O09522 Supervision of elderly multigravida, second trimester: Secondary | ICD-10-CM

## 2016-06-15 LAB — POCT URINALYSIS DIP (DEVICE)
Bilirubin Urine: NEGATIVE
Glucose, UA: 100 mg/dL — AB
Hgb urine dipstick: NEGATIVE
Ketones, ur: NEGATIVE mg/dL
Leukocytes, UA: NEGATIVE
Nitrite: NEGATIVE
Protein, ur: NEGATIVE mg/dL
Specific Gravity, Urine: <=1.005 (ref 1.005–1.030)
Urobilinogen, UA: 0.2 mg/dL (ref 0.0–1.0)
pH: 5.5 (ref 5.0–8.0)

## 2016-06-15 LAB — URINE CULTURE

## 2016-06-15 MED ORDER — INSULIN DETEMIR 100 UNIT/ML FLEXPEN: PEN_INJECTOR | SUBCUTANEOUS | 11 refills | Status: DC

## 2016-06-15 MED ORDER — INSULIN DETEMIR 100 UNIT/ML FLEXPEN: SUBCUTANEOUS | 5 refills | Status: DC

## 2016-06-15 NOTE — Patient Instructions (Signed)
Type 1 or Type 2 Diabetes Mellitus During Pregnancy, Self Care Caring for yourself during your pregnancy when you have type 1 diabetes (type 1 diabetes mellitus) or type 2 diabetes (type 2 diabetes mellitus) means keeping your blood sugar (glucose) under control with a balance of:  Nutrition.  Exercise.  Lifestyle changes.  Insulin or medicines, if necessary.  Support from your team of health care providers and others. The following information explains what you need to know to manage your diabetes at home during your pregnancy. What do I need to do to manage my blood glucose?  Check your blood glucose every day, as often as told by your health care provider.  Contact your health care provider if your blood glucose is above your target for 2 tests in a row.  Have your A1c (hemoglobin A1c) level checked at least two times a year, or as often as told by your health care provider. Your health care provider will set individualized treatment goals for you. Generally, the goal of treatment is to maintain the following blood glucose levels during pregnancy:  After not eating (after fasting) for 8 hours: at or below 95 mg/dL (5.3 mmol/L).  After meals (postprandial):  One hour after a meal: at or below 140 mg/dL (7.8 mmol/L).  Two hours after a meal: at or below 120 mg/dL (6.7 mmol/L).  A1c level: 6-6.5% What do I need to know about hyperglycemia and hypoglycemia? What is hyperglycemia? Hyperglycemia, also called high blood glucose, occurs when blood glucose is too high. Make sure you know the early signs of hyperglycemia, such as:  Increased thirst.  Hunger.  Feeling very tired.  Needing to urinate more often than usual.  Blurry vision. What is hypoglycemia?  Hypoglycemia, also called low blood glucose, occurs with a blood glucose level at or below 70 mg/dL (3.9 mmol/L). The risk for hypoglycemia increases during or after exercise, during sleep, during illness, and when skipping  meals or fasting. It is important to know the symptoms of hypoglycemia and treat it right away. Always have a 15-gram rapid-acting carbohydrate snack with you to treat low blood glucose. Family members and close friends should also know the symptoms and should understand how to treat hypoglycemia, in case you are not able to treat yourself. What are the symptoms of hypoglycemia?  Hypoglycemia symptoms can include:  Hunger.  Anxiety.  Sweating and feeling clammy.  Confusion.  Dizziness or feeling light-headed.  Sleepiness.  Nausea.  Increased heart rate.  Headache.  Blurry vision.  Seizure.  Nightmares.  Tingling or numbness around the mouth, lips, or tongue.  A change in speech.  Decreased ability to concentrate.  A change in coordination.  Restless sleep.  Tremors or shakes.  Fainting.  Irritability. How do I treat hypoglycemia?   If you are alert and able to swallow safely, follow the 15:15 rule:  Take 15 grams of a rapid-acting carbohydrate. Rapid-acting options include:  1 tube of glucose gel.  3 glucose pills.  6-8 pieces of hard candy.  4 oz (120 mL) of fruit juice .  4 oz (120 mL) of regular (not diet) soda.  Check your blood glucose 15 minutes after you take the carbohydrate.  If the repeat blood glucose level is still at or below 70 mg/dL (3.9 mmol/L), take 15 grams of a carbohydrate again.  If your blood glucose level does not increase above 70 mg/dL (3.9 mmol/L) after 3 tries, seek emergency medical care.  After your blood glucose level returns to  normal, eat a meal or a snack within 1 hour. How do I treat severe hypoglycemia?  Severe hypoglycemia is when your blood glucose level is at or below 54 mg/dL (3 mmol/L). Severe hypoglycemia is an emergency. Do not wait to see if the symptoms will go away. Get medical help right away. Call your local emergency services (911 in the [place] Do not drive yourself to the hospital. If you have severe  hypoglycemia and you cannot eat or drink, you may need an injection of glucagon. A family member or close friend should learn how to check your blood glucose and how to give you a glucagon injection. Ask your health care provider if you need to have an emergency glucagon injection kit available. Severe hypoglycemia may need to be treated in a hospital. The treatment may include getting glucose through an IV tube. You may also need treatment for the cause of your hypoglycemia. Can having diabetes put me at risk for other conditions? Having diabetes can put you at risk for other long-term (chronic) conditions, such as heart disease and kidney disease. Your health care provider may prescribe medicines to help prevent complications from diabetes. These medicines may include:  Aspirin.  Medicine to lower cholesterol.  Medicine to control blood pressure. What else can I do to manage my diabetes? Take your diabetes medicines as told   If your health care provider prescribed insulin or diabetes medicines, take them every day.  Do not run out of insulin or other diabetes medicines that you take. Plan ahead so you always have these available.  If you use insulin, adjust your dosage based on how physically active you are and what foods you eat. Your health care provider will tell you how to adjust your dosage. Your health care provider may recommend that you take one low-dose aspirin (81 mg) each day to help prevent high blood pressure during pregnancy (preeclampsia or eclampsia). You may be at risk for preeclampsia or eclampsia if:  You had any of the following during a previous pregnancy:  Preeclampsia or eclampsia.  A fetal growth rate that was slower than normal.  An early (preterm) birth.  Separation of the placenta from the uterus (placental abruption).  Fetal loss.  You are pregnant with more than one baby.  You have other medical conditions, such as high blood pressure or an autoimmune  disease. Make healthy food choices   The things that you eat and drink affect your blood glucose and your insulin dosage. Making good choices helps to control your diabetes and prevent other health problems. A healthy meal plan includes eating lean proteins, complex carbohydrates, fresh fruits and vegetables, low-fat dairy products, and healthy fats. Make an appointment to see a diet and nutrition specialist (registered dietitian) to help you create an eating plan that is right for you. Make sure that you:  Follow instructions from your health care provider about eating or drinking restrictions.  Drink enough fluid to keep your urine clear or pale yellow.  Eat healthy snacks between nutritious meals.  Track the carbohydrates that you eat. Do this by reading food labels and learning the standard serving sizes of foods.  Follow your sick day plan whenever you cannot eat or drink as usual. Make this plan in advance with your health care provider. Stay active    Do at least 30 minutes of physical activity a day, or as much physical activity as your health care provider recommends during your pregnancy.  If you start a  new exercise or activity, work with your health care provider to adjust your insulin, medicines, or food intake as needed. Make healthy lifestyle choices   Do not use any tobacco products, such as cigarettes, chewing tobacco, and e-cigarettes. If you need help quitting, ask your health care provider.  Do not use alcohol.  Learn to manage stress. If you need help with this, ask your health care provider. Care for your body   Keep your immunizations up to date.  Schedule an eye exam during your first trimester of your pregnancy, or as told by your health care provider.  Check your skin and feet every day for cuts, bruises, redness, blisters, or sores. Schedule a foot exam with your health care provider once every year.  Brush your teeth and gums two times a day, and floss  at least one time a day. Visit your dentist at least once every 6 months.  Maintain a healthy weight during your pregnancy. General instructions    Take over-the-counter and prescription medicines only as told by your health care provider.  Talk with your health care provider about your risk for high blood pressure during pregnancy (preeclampsia or eclampsia).  Share your diabetes management plan with people in your workplace, school, and household.  Check your urine ketones when you are ill and as told by your health care provider.  Carry a medical alert card or wear medical alert jewelry.  Ask your health care provider:  Do I need to meet with a diabetes educator?  Where can I find a support group for people with diabetes?  Keep all follow-up visits during your pregnancy (prenatal) and after delivery (postnatal) as told by your health care provider. This is important. Where to find more information: For more information about diabetes, visit:  American Diabetes Association (ADA): www.diabetes.org  American Association of Diabetes Educators (AADE): https://www.diabeteseducator.org/patient-resources This information is not intended to replace advice given to you by your health care provider. Make sure you discuss any questions you have with your health care provider. Document Released: 06/28/2015 Document Revised: 08/12/2015 Document Reviewed: 04/09/2015 Elsevier Interactive Patient Education  2017 [employer].

## 2016-06-15 NOTE — Telephone Encounter (Signed)
LM for call back to move up appt

## 2016-06-15 NOTE — Progress Notes (Signed)
Pt c/o intractable headache

## 2016-06-15 NOTE — Progress Notes (Signed)
   PRENATAL VISIT NOTE  Subjective:  Jade Mathis is a 41 y.o. [phone] at [redacted]w[redacted]d being seen today for ongoing prenatal care.  She is currently monitored for the following issues for this high-risk pregnancy and has HYPOTHYROIDISM, POSTSURGICAL; Diabetes mellitus type 2 in obese ([place]); OBESITY, NOS; DEPRESSION, MAJOR, RECURRENT, MODERATE; Migraine; Myasthenia gravis ([place]); Asthma; GERD; OSA (obstructive sleep apnea); Pure hypercholesterolemia; Fibromyalgia; Stool incontinence; Ventral hernia; Preexisting diabetes complicating pregnancy, antepartum; Myasthenia gravis complicating pregnancy ([place]); Depression affecting pregnancy, antepartum; Hypothyroidism affecting pregnancy, antepartum; Advanced maternal age in multigravida; Supervision of high-risk pregnancy; and Diabetes mellitus affecting pregnancy, second trimester on her problem list.  Patient reports HA C/W chronic headaches. Denies fever, chills, vision changes, weakness, difficulties w/ speech or gait.  Contractions: Not present. Vag. Bleeding: None.  Movement: Absent. Denies leaking of fluid.   DM: Was discharged from the hospital for blood sugar management. Was not able to afford Novolog that was Rx'd at discharge. Taking Levamir, Metformin and Humalog as instructed. Has appt scheduled w/ Dr Dwyane Dee her Endocrinologist, but next available appt in late April.   HA: When asked if she had HA doctor she stated she did not. On review of Epic notes she sees [facility] in [place], last visit 06/02/16.   The following portions of the patient's history were reviewed and updated as appropriate: allergies, current medications, past family history, past medical history, past social history, past surgical history and problem list. Problem list updated.  Objective:   Vitals:   06/15/16 0933  BP: 107/77  Pulse: 98  Weight: 259 lb 1.6 oz (117.5 kg)    Fetal Status: Fetal Heart Rate (bpm): 133 Fundal Height: 26 cm Movement: Absent      General:  Alert, oriented and cooperative. Patient is in mild distress. Has sunglasses on.  Skin: Skin is warm and dry. No rash noted.   Cardiovascular: Normal heart rate noted  Respiratory: Normal respiratory effort, no problems with respiration noted  Abdomen: Soft, gravid, appropriate for gestational age. Pain/Pressure: Present     Pelvic:  Cervical exam declined        Extremities: Normal range of motion.  Edema: None  Mental Status: Normal mood and affect. Normal behavior. Normal judgment and thought content.   Fasting blood sugars 150-190  Assessment and Plan:  Pregnancy: [id number] at 101w6d  1. Preexisting diabetes complicating pregnancy, antepartum - Consulted w/ [facility]. Will increase Levamir @[facility] , but blood sugar meds need to be managed by one group--preferably Endocrinology. F/U AS or sooner if possible.   - insulin detemir (LEVEMIR) 100 unit/ml SOLN; 20 units in the morning and 80 units at bedtime.  Dispense: 1 vial; Refill: 5  2. Supervision of high risk pregnancy in second trimester   3. Migraine without status migrainosus, not intractable, unspecified migraine type - C/W chronic Migraine. No evidence of emergent condition.  - F/u w/ [facility]. We will not be managing HA's.   4. Elderly multigravida in second trimester   Preterm labor symptoms and general obstetric precautions including but not limited to vaginal bleeding, contractions, leaking of fluid and fetal movement were reviewed in detail with the patient. Please refer to After Visit Summary for other counseling recommendations.  Return in about 4 days (around 06/19/2016) for ROB.   Manya Silvas, CNM

## 2016-06-16 ENCOUNTER — Other Ambulatory Visit: Payer: Self-pay | Admitting: Neurology

## 2016-06-17 LAB — TYPE AND SCREEN
ABO/RH(D): O POS
Antibody Screen: POSITIVE
DAT, IgG: NEGATIVE
Unit division: 0
Unit division: 0

## 2016-06-17 LAB — BPAM RBC
Blood Product Expiration Date: 201805022359
Blood Product Expiration Date: 201805022359
Unit Type and Rh: 5100
Unit Type and Rh: 5100

## 2016-06-19 ENCOUNTER — Ambulatory Visit (INDEPENDENT_AMBULATORY_CARE_PROVIDER_SITE_OTHER): Payer: Medicare Other | Admitting: Family Medicine

## 2016-06-19 ENCOUNTER — Encounter: Payer: Self-pay | Admitting: Neurology

## 2016-06-19 VITALS — BP 111/69 | HR 104 | Wt 260.0 lb

## 2016-06-19 DIAGNOSIS — O99342 Other mental disorders complicating pregnancy, second trimester: Secondary | ICD-10-CM

## 2016-06-19 DIAGNOSIS — O99340 Other mental disorders complicating pregnancy, unspecified trimester: Secondary | ICD-10-CM

## 2016-06-19 DIAGNOSIS — O24912 Unspecified diabetes mellitus in pregnancy, second trimester: Secondary | ICD-10-CM

## 2016-06-19 DIAGNOSIS — F32A Depression, unspecified: Secondary | ICD-10-CM

## 2016-06-19 DIAGNOSIS — E039 Hypothyroidism, unspecified: Secondary | ICD-10-CM

## 2016-06-19 DIAGNOSIS — O24319 Unspecified pre-existing diabetes mellitus in pregnancy, unspecified trimester: Secondary | ICD-10-CM

## 2016-06-19 DIAGNOSIS — O99282 Endocrine, nutritional and metabolic diseases complicating pregnancy, second trimester: Secondary | ICD-10-CM

## 2016-06-19 DIAGNOSIS — O09522 Supervision of elderly multigravida, second trimester: Secondary | ICD-10-CM

## 2016-06-19 DIAGNOSIS — F329 Major depressive disorder, single episode, unspecified: Secondary | ICD-10-CM

## 2016-06-19 DIAGNOSIS — O0992 Supervision of high risk pregnancy, unspecified, second trimester: Secondary | ICD-10-CM

## 2016-06-19 DIAGNOSIS — O99280 Endocrine, nutritional and metabolic diseases complicating pregnancy, unspecified trimester: Secondary | ICD-10-CM

## 2016-06-19 LAB — POCT URINALYSIS DIP (DEVICE)
Bilirubin Urine: NEGATIVE
Glucose, UA: 250 mg/dL — AB
Hgb urine dipstick: NEGATIVE
Ketones, ur: NEGATIVE mg/dL
Leukocytes, UA: NEGATIVE
Nitrite: NEGATIVE
Protein, ur: NEGATIVE mg/dL
Specific Gravity, Urine: 1.025 (ref 1.005–1.030)
Urobilinogen, UA: 0.2 mg/dL (ref 0.0–1.0)
pH: 5.5 (ref 5.0–8.0)

## 2016-06-19 MED ORDER — ACCU-CHEK AVIVA PLUS VI STRP: 1.0000 | ORAL_STRIP | Freq: Four times a day (QID) | 11 refills | Status: DC

## 2016-06-19 MED ORDER — INSULIN DETEMIR 100 UNIT/ML FLEXPEN: PEN_INJECTOR | SUBCUTANEOUS | 11 refills | Status: DC

## 2016-06-19 NOTE — Progress Notes (Signed)
Subjective:  Jade Mathis is a 41 y.o. [phone] at [redacted]w[redacted]d being seen today for ongoing prenatal care.  She is currently monitored for the following issues for this high-risk pregnancy and has HYPOTHYROIDISM, POSTSURGICAL; Diabetes mellitus type 2 in obese ([place]); OBESITY, NOS; DEPRESSION, MAJOR, RECURRENT, MODERATE; Migraine; Myasthenia gravis ([place]); Asthma; GERD; OSA (obstructive sleep apnea); Pure hypercholesterolemia; Fibromyalgia; Stool incontinence; Ventral hernia; Preexisting diabetes complicating pregnancy, antepartum; Myasthenia gravis complicating pregnancy ([place]); Depression affecting pregnancy, antepartum; Hypothyroidism affecting pregnancy, antepartum; Advanced maternal age in multigravida; Supervision of high-risk pregnancy; and Diabetes mellitus affecting pregnancy, second trimester on her problem list.  GDM: Patient taking Metformin XR 2000 mg daily. Also takes Levemir 20u in AM and 80u at night, humalog 22u before meals, then 10-12u at 2hr CBG if elevated.  Reports no hypoglycemic episodes.  Tolerating medication well Fasting: 129-172 2hr PP: 91-181 Has only been checking 3 times a day Does eat a snack about 2 hours after dinner  Is on prednisone chronically for Myasthenia Gravis. Symptoms are fairly well controlled, although felt a little weak this morning - slowly improving.  Patient reports no complaints.  Contractions: Irritability. Vag. Bleeding: None.  Movement: Present. Denies leaking of fluid.   The following portions of the patient's history were reviewed and updated as appropriate: allergies, current medications, past family history, past medical history, past social history, past surgical history and problem list. Problem list updated.  Objective:   Vitals:   06/19/16 1551  BP: 111/69  Pulse: (!) 104  Weight: 260 lb (117.9 kg)    Fetal Status: Fetal Heart Rate (bpm): 140   Movement: Present     General:  Alert, oriented and cooperative. Patient is in no acute  distress.  Skin: Skin is warm and dry. No rash noted.   Cardiovascular: Normal heart rate noted  Respiratory: Normal respiratory effort, no problems with respiration noted  Abdomen: Soft, gravid, appropriate for gestational age. Pain/Pressure: Present     Pelvic: Vag. Bleeding: None     Cervical exam deferred        Extremities: Normal range of motion.  Edema: Moderate pitting, indentation subsides rapidly  Mental Status: Normal mood and affect. Normal behavior. Normal judgment and thought content.   Urinalysis: Urine Protein: Trace Urine Glucose: 2+  Assessment and Plan:  Pregnancy: [id number] at [redacted]w[redacted]d  1. Diabetes mellitus affecting pregnancy, second trimester Fasting blood sugars still high. I discussed with the patient that I need her to consistently check her blood sugars after dinner to see if She is not getting enough mealtime coverage at dinner. I suspect that her snack is contributing to her high blood sugars. Rewrote script for strips so she can check her blood sugars 4 times daily. Will send message to her endocrinologist - perhaps a pump would be helpful?  For now, increase Levemir to 90 units.  2. Hypothyroidism affecting pregnancy, antepartum Continue synthroid: TSH 1.26 on 3/6. Continue synthroid.  3. Elderly multigravida in second trimester  4. Supervision of high risk pregnancy in second trimester FHT normal  5. Depression affecting pregnancy, antepartum Controlled  Preterm labor symptoms and general obstetric precautions including but not limited to vaginal bleeding, contractions, leaking of fluid and fetal movement were reviewed in detail with the patient. Please refer to After Visit Summary for other counseling recommendations.  No Follow-up on file.   Truett Mainland, DO

## 2016-06-19 NOTE — Progress Notes (Signed)
Patient declines seeing Roselyn Reef today.

## 2016-06-20 ENCOUNTER — Other Ambulatory Visit (INDEPENDENT_AMBULATORY_CARE_PROVIDER_SITE_OTHER): Payer: Medicare Other

## 2016-06-20 DIAGNOSIS — Z794 Long term (current) use of insulin: Secondary | ICD-10-CM | POA: Diagnosis not present

## 2016-06-20 DIAGNOSIS — E1165 Type 2 diabetes mellitus with hyperglycemia: Secondary | ICD-10-CM | POA: Diagnosis not present

## 2016-06-20 LAB — BASIC METABOLIC PANEL
BUN: 8 mg/dL (ref 6–23)
CO2: 24 mEq/L (ref 19–32)
Calcium: 8.8 mg/dL (ref 8.4–10.5)
Chloride: 104 mEq/L (ref 96–112)
Creatinine, Ser: 0.45 mg/dL (ref 0.40–1.20)
GFR: 197.16 mL/min (ref >60.00)
Glucose, Bld: 113 mg/dL — ABNORMAL HIGH (ref 70–99)
Potassium: 3.5 mEq/L (ref 3.5–5.1)
Sodium: 135 mEq/L (ref 135–145)

## 2016-06-20 LAB — HEMOGLOBIN A1C: Hgb A1c MFr Bld: 6.3 % (ref 4.6–6.5)

## 2016-06-20 MED ORDER — INSULIN DETEMIR 100 UNIT/ML FLEXPEN: PEN_INJECTOR | SUBCUTANEOUS | 11 refills | Status: DC

## 2016-06-20 NOTE — Addendum Note (Signed)
Addended by: Shelly Coss on: 06/20/2016 09:02 AM   Modules accepted: Orders

## 2016-06-21 MED ORDER — GLUCOSE BLOOD VI STRP: ORAL_STRIP | 2 refills | Status: DC

## 2016-06-21 NOTE — Telephone Encounter (Signed)
I contacted the patient. She stated she needed a refill of her Accu-chek guide test strips submitted to [facility] on [address]. Rx submitted per patients request.

## 2016-06-21 NOTE — Telephone Encounter (Signed)
Patient ask you to call her as soon as you can.

## 2016-06-22 ENCOUNTER — Encounter: Payer: Self-pay | Admitting: Endocrinology

## 2016-06-22 ENCOUNTER — Ambulatory Visit (INDEPENDENT_AMBULATORY_CARE_PROVIDER_SITE_OTHER): Payer: Medicare Other | Admitting: Endocrinology

## 2016-06-22 VITALS — BP 126/72 | HR 96 | Ht 69.0 in | Wt 263.0 lb

## 2016-06-22 DIAGNOSIS — Z794 Long term (current) use of insulin: Secondary | ICD-10-CM | POA: Diagnosis not present

## 2016-06-22 DIAGNOSIS — E1165 Type 2 diabetes mellitus with hyperglycemia: Secondary | ICD-10-CM | POA: Diagnosis not present
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
## 2016-06-22 NOTE — Patient Instructions (Signed)
Levemir 94 at dinner and 24 in am  Humalog 20 at Bfst 22 at lunch and 24 at dinner  Sugar 120-160 add 2 more units and over 160, add 4 more Humalog

## 2016-06-23 ENCOUNTER — Ambulatory Visit (INDEPENDENT_AMBULATORY_CARE_PROVIDER_SITE_OTHER): Payer: Medicare Other | Admitting: Internal Medicine

## 2016-06-23 ENCOUNTER — Encounter: Payer: Self-pay | Admitting: Internal Medicine

## 2016-06-23 DIAGNOSIS — J4530 Mild persistent asthma, uncomplicated: Secondary | ICD-10-CM | POA: Diagnosis not present

## 2016-06-23 MED ORDER — CETIRIZINE HCL 10 MG PO TABS: 10.0000 mg | ORAL_TABLET | Freq: Every day | ORAL | 11 refills | Status: DC

## 2016-06-23 NOTE — Progress Notes (Signed)
   Subjective:    Patient ID: Jade Mathis, female    DOB: 1975-11-11, 41 y.o.   MRN: [id number]  HPI The patient is a 41 YO female coming in for follow up of her asthma. She is currently 2nd trimester pregnant. Using albuterol a little bit lately due to allergies. She is not sure what is safe to take in pregnancy and has not taken anything. She is not able to do much exertion due to fatigue from pregnancy and her myasthenia. Denies any wheezing that she is hearing. She is having nasal dripping, no sore throat. No ear pain or sinus pressure. No fevers or chills.   Review of Systems  Constitutional: Positive for activity change, appetite change and fatigue. Negative for chills, fever and unexpected weight change.  HENT: Positive for congestion, postnasal drip and rhinorrhea. Negative for ear discharge, ear pain, sinus pain, sinus pressure, sore throat, trouble swallowing and voice change.   Eyes: Negative.   Respiratory: Positive for shortness of breath. Negative for cough, chest tightness and wheezing.   Cardiovascular: Negative.   Musculoskeletal: Positive for arthralgias and myalgias. Negative for gait problem.  Skin: Negative.   Neurological: Positive for weakness. Negative for dizziness, facial asymmetry, light-headedness and headaches.      Objective:   Physical Exam  Constitutional: She is oriented to person, place, and time. She appears well-developed and well-nourished.  HENT:  Head: Normocephalic and atraumatic.  Oropharynx with redness and clear drainage, nose without crusting, no sinus pressure, TMs normal bilaterally.   Eyes: EOM are normal.  Neck: Normal range of motion.  Cardiovascular: Normal rate and regular rhythm.   Pulmonary/Chest: Effort normal and breath sounds normal. No respiratory distress. She has no wheezes. She has no rales.  Abdominal: Soft. She exhibits no distension. There is no tenderness. There is no rebound.  Neurological: She is alert and oriented to  person, place, and time.  Skin: Skin is warm and dry.   Vitals:   06/23/16 0942  BP: 126/68  Pulse: 95  Resp: 14  Temp: 98.2 F (36.8 C)  TempSrc: Oral  SpO2: 100%  Weight: 246 lb (111.6 kg)  Height: 5\' 9"  (1.753 m)      Assessment & Plan:

## 2016-06-23 NOTE — Patient Instructions (Signed)
We have sent in the zyrtec for the allergies. Take 1 pill daily.

## 2016-06-23 NOTE — Assessment & Plan Note (Signed)
Using albuterol inhaler and flovent for control and no flare today. She is prescribed zyrtec today for allergy control to help avoid a flare. She is encouraged to do some activity to help with endurance. Advised to call with any worsening or return for visit.

## 2016-06-23 NOTE — Progress Notes (Signed)
Pre visit review using our clinic review tool, if applicable. No additional management support is needed unless otherwise documented below in the visit note. 

## 2016-06-24 ENCOUNTER — Other Ambulatory Visit: Payer: Self-pay | Admitting: Endocrinology

## 2016-06-26 ENCOUNTER — Other Ambulatory Visit: Payer: Self-pay

## 2016-06-26 DIAGNOSIS — M5412 Radiculopathy, cervical region: Secondary | ICD-10-CM | POA: Diagnosis not present

## 2016-06-26 DIAGNOSIS — Z79899 Other long term (current) drug therapy: Secondary | ICD-10-CM | POA: Diagnosis not present

## 2016-06-26 MED ORDER — LEVOTHYROXINE SODIUM 175 MCG PO TABS: 175.0000 ug | ORAL_TABLET | Freq: Every day | ORAL | 1 refills | Status: DC

## 2016-06-26 NOTE — Telephone Encounter (Signed)
Pt needs refills on synthyroid please, called to [facility] on [address].

## 2016-06-26 NOTE — Telephone Encounter (Signed)
Ordered

## 2016-06-27 ENCOUNTER — Other Ambulatory Visit: Payer: Self-pay

## 2016-06-27 ENCOUNTER — Encounter: Payer: Medicare Other | Attending: Endocrinology | Admitting: Nutrition

## 2016-06-27 DIAGNOSIS — Z3A17 17 weeks gestation of pregnancy: Secondary | ICD-10-CM | POA: Diagnosis not present

## 2016-06-27 DIAGNOSIS — O24312 Unspecified pre-existing diabetes mellitus in pregnancy, second trimester: Secondary | ICD-10-CM | POA: Diagnosis not present

## 2016-06-27 DIAGNOSIS — O24912 Unspecified diabetes mellitus in pregnancy, second trimester: Secondary | ICD-10-CM

## 2016-06-27 DIAGNOSIS — O24319 Unspecified pre-existing diabetes mellitus in pregnancy, unspecified trimester: Secondary | ICD-10-CM | POA: Diagnosis present

## 2016-06-27 MED ORDER — FREESTYLE LIBRE SENSOR SYSTEM MISC: 4 refills | Status: DC

## 2016-06-27 MED ORDER — FREESTYLE LIBRE READER DEVI: 1.0000 | 1 refills | Status: DC

## 2016-06-27 NOTE — Progress Notes (Signed)
Pt. Said she had a Aviva meter, but was given a new accur Chek meter, and did not have any more new test strips.  Pharmacy woud not give her more strips, because she just gog the Aviva test strips, bug her Aviva stopped working.  She was told to call the 800 number for a new meter, since I had no Aviva meter to give her, and no new Accucheck test stips.  She agreed to do this. FBSs high, 120-134, and acL85-161, acS: 149-178, and HS: 172-197.   Reports taking:  Levemir: AM: 20/PM: 94,  Humalog: 20-22u acall meals.  Dr. Dwyane Dee was shown the reading and increased the dose to 26u of Humalog acS and Levemir 100u at HS.  Written instructions were given for this new insulin dose. She also wanted information on insulin pump therapy, and CGM therapy.  She liked the Meno CGM, and the OmniPod pump.  She signed the paperwork, for the pump, and it was faxed in.  She was told to call me when she gets the CGM and/or the pump.  Telephone numbers given to her.

## 2016-06-27 NOTE — Patient Instructions (Addendum)
Call when you receive the pump to schedule training. See dietitian to learn carb counting

## 2016-06-28 ENCOUNTER — Ambulatory Visit (INDEPENDENT_AMBULATORY_CARE_PROVIDER_SITE_OTHER): Payer: Medicare Other | Admitting: Obstetrics & Gynecology

## 2016-06-28 ENCOUNTER — Other Ambulatory Visit (HOSPITAL_COMMUNITY)
Admission: RE | Admit: 2016-06-28 | Discharge: 2016-06-28 | Disposition: A | Discharge status: Home / Self Care | Payer: Medicare Other | Source: Ambulatory Visit | Attending: Obstetrics & Gynecology | Admitting: Obstetrics & Gynecology

## 2016-06-28 VITALS — BP 115/71 | HR 104 | Wt 260.1 lb

## 2016-06-28 DIAGNOSIS — O99282 Endocrine, nutritional and metabolic diseases complicating pregnancy, second trimester: Secondary | ICD-10-CM

## 2016-06-28 DIAGNOSIS — Z113 Encounter for screening for infections with a predominantly sexual mode of transmission: Secondary | ICD-10-CM

## 2016-06-28 DIAGNOSIS — O26892 Other specified pregnancy related conditions, second trimester: Secondary | ICD-10-CM | POA: Insufficient documentation

## 2016-06-28 DIAGNOSIS — R102 Pelvic and perineal pain: Secondary | ICD-10-CM | POA: Diagnosis not present

## 2016-06-28 DIAGNOSIS — O0992 Supervision of high risk pregnancy, unspecified, second trimester: Secondary | ICD-10-CM

## 2016-06-28 DIAGNOSIS — O24312 Unspecified pre-existing diabetes mellitus in pregnancy, second trimester: Secondary | ICD-10-CM

## 2016-06-28 DIAGNOSIS — O99352 Diseases of the nervous system complicating pregnancy, second trimester: Secondary | ICD-10-CM

## 2016-06-28 DIAGNOSIS — O24319 Unspecified pre-existing diabetes mellitus in pregnancy, unspecified trimester: Secondary | ICD-10-CM

## 2016-06-28 DIAGNOSIS — O99280 Endocrine, nutritional and metabolic diseases complicating pregnancy, unspecified trimester: Secondary | ICD-10-CM

## 2016-06-28 DIAGNOSIS — O99350 Diseases of the nervous system complicating pregnancy, unspecified trimester: Secondary | ICD-10-CM

## 2016-06-28 DIAGNOSIS — E039 Hypothyroidism, unspecified: Secondary | ICD-10-CM

## 2016-06-28 DIAGNOSIS — G7000 Myasthenia gravis without (acute) exacerbation: Secondary | ICD-10-CM

## 2016-06-28 LAB — POCT URINALYSIS DIP (DEVICE)
Bilirubin Urine: NEGATIVE
Glucose, UA: NEGATIVE mg/dL
Hgb urine dipstick: NEGATIVE
Ketones, ur: NEGATIVE mg/dL
Leukocytes, UA: NEGATIVE
Nitrite: NEGATIVE
Protein, ur: NEGATIVE mg/dL
Specific Gravity, Urine: 1.02 (ref 1.005–1.030)
Urobilinogen, UA: 1 mg/dL (ref 0.0–1.0)
pH: 6 (ref 5.0–8.0)

## 2016-06-28 NOTE — Patient Instructions (Signed)
Return to clinic for any scheduled appointments or obstetric concerns, or go to [facility] for evaluation  Tdap Vaccine (Tetanus, Diphtheria and Pertussis): What You Need to Know 1. Why get vaccinated? Tetanus, diphtheria and pertussis are very serious diseases. Tdap vaccine can protect [place] from these diseases. And, Tdap vaccine given to pregnant women can protect newborn babies against pertussis. TETANUS (Lockjaw) is rare in the [place] today. It causes painful muscle tightening and stiffness, usually all over the body.  It can lead to tightening of muscles in the head and neck so you can't open your mouth, swallow, or sometimes even breathe. Tetanus kills about 1 out of 10 people who are infected even after receiving the best medical care. DIPHTHERIA is also rare in the [place] today. It can cause a thick coating to form in the back of the throat.  It can lead to breathing problems, heart failure, paralysis, and death. PERTUSSIS (Whooping Cough) causes severe coughing spells, which can cause difficulty breathing, vomiting and disturbed sleep.  It can also lead to weight loss, incontinence, and rib fractures. Up to 2 in 100 adolescents and 5 in 100 adults with pertussis are hospitalized or have complications, which could include pneumonia or death. These diseases are caused by bacteria. Diphtheria and pertussis are spread from person to person through secretions from coughing or sneezing. Tetanus enters the body through cuts, scratches, or wounds. Before vaccines, as many as 200,000 cases of diphtheria, 200,000 cases of pertussis, and hundreds of cases of tetanus, were reported in the [place] each year. Since vaccination began, reports of cases for tetanus and diphtheria have dropped by about 99% and for pertussis by about 80%. 2. Tdap vaccine Tdap vaccine can protect adolescents and adults from tetanus, diphtheria, and pertussis. One dose of Tdap is routinely given at age 80 or 48.  People who did not get Tdap at that age should get it as soon as possible. Tdap is especially important for healthcare professionals and anyone having close contact with a baby younger than 12 months. Pregnant women should get a dose of Tdap during every pregnancy, to protect the newborn from pertussis. Infants are most at risk for severe, life-threatening complications from pertussis. Another vaccine, called Td, protects against tetanus and diphtheria, but not pertussis. A Td booster should be given every 10 years. Tdap may be given as one of these boosters if you have never gotten Tdap before. Tdap may also be given after a severe cut or burn to prevent tetanus infection. Your doctor or the person giving you the vaccine can give you more information. Tdap may safely be given at the same time as other vaccines. 3. Some people should not get this vaccine  A person who has ever had a life-threatening allergic reaction after a previous dose of any diphtheria, tetanus or pertussis containing vaccine, OR has a severe allergy to any part of this vaccine, should not get Tdap vaccine. Tell the person giving the vaccine about any severe allergies.  Anyone who had coma or long repeated seizures within 7 days after a childhood dose of DTP or DTaP, or a previous dose of Tdap, should not get Tdap, unless a cause other than the vaccine was found. They can still get Td.  Talk to your doctor if you:  have seizures or another nervous system problem,  had severe pain or swelling after any vaccine containing diphtheria, tetanus or pertussis,  ever had a condition called Guillain-Barr Syndrome (GBS),  aren't feeling  well on the day the shot is scheduled. 4. Risks With any medicine, including vaccines, there is a chance of side effects. These are usually mild and go away on their own. Serious reactions are also possible but are rare. Most people who get Tdap vaccine do not have any problems with it. Mild  problems following Tdap:  (Did not interfere with activities)  Pain where the shot was given (about 3 in 4 adolescents or 2 in 3 adults)  Redness or swelling where the shot was given (about 1 person in 5)  Mild fever of at least 100.62F (up to about 1 in 25 adolescents or 1 in 100 adults)  Headache (about 3 or 4 people in 10)  Tiredness (about 1 person in 3 or 4)  Nausea, vomiting, diarrhea, stomach ache (up to 1 in 4 adolescents or 1 in 10 adults)  Chills, sore joints (about 1 person in 10)  Body aches (about 1 person in 3 or 4)  Rash, swollen glands (uncommon) Moderate problems following Tdap:  (Interfered with activities, but did not require medical attention)  Pain where the shot was given (up to 1 in 5 or 6)  Redness or swelling where the shot was given (up to about 1 in 16 adolescents or 1 in 12 adults)  Fever over 102F (about 1 in 100 adolescents or 1 in 250 adults)  Headache (about 1 in 7 adolescents or 1 in 10 adults)  Nausea, vomiting, diarrhea, stomach ache (up to 1 or 3 people in 100)  Swelling of the entire arm where the shot was given (up to about 1 in 500). Severe problems following Tdap:  (Unable to perform usual activities; required medical attention)  Swelling, severe pain, bleeding and redness in the arm where the shot was given (rare). Problems that could happen after any vaccine:   People sometimes faint after a medical procedure, including vaccination. Sitting or lying down for about 15 minutes can help prevent fainting, and injuries caused by a fall. Tell your doctor if you feel dizzy, or have vision changes or ringing in the ears.  Some people get severe pain in the shoulder and have difficulty moving the arm where a shot was given. This happens very rarely.  Any medication can cause a severe allergic reaction. Such reactions from a vaccine are very rare, estimated at fewer than 1 in a million doses, and would happen within a few minutes to a few  hours after the vaccination. As with any medicine, there is a very remote chance of a vaccine causing a serious injury or death. The safety of vaccines is always being monitored. For more information, visit: http://www.aguilar.org/ 5. What if there is a serious problem? What should I look for?  Look for anything that concerns you, such as signs of a severe allergic reaction, very high fever, or unusual behavior. Signs of a severe allergic reaction can include hives, swelling of the face and throat, difficulty breathing, a fast heartbeat, dizziness, and weakness. These would usually start a few minutes to a few hours after the vaccination. What should I do?   If you think it is a severe allergic reaction or other emergency that can't wait, call 9-1-1 or get the person to the nearest hospital. Otherwise, call your doctor.  Afterward, the reaction should be reported to the Vaccine Adverse Event Reporting System (VAERS). Your doctor might file this report, or you can do it yourself through the VAERS web site at www.vaers.SamedayNews.es, or by calling  8486387007.  VAERS does not give medical advice. 6. The National Vaccine Injury Compensation Program The [employer] Vaccine Injury Compensation Program (VICP) is a federal program that was created to compensate people who may have been injured by certain vaccines. Persons who believe they may have been injured by a vaccine can learn about the program and about filing a claim by calling (713)780-1822 or visiting the [place] website at GoldCloset.com.ee. There is a time limit to file a claim for compensation. 7. How can I learn more?  Ask your doctor. He or she can give you the vaccine package insert or suggest other sources of information.  Call your local or state health department.  Contact the Centers for Disease Control and Prevention (CDC):  Call 661-786-4981 (1-800-CDC-INFO) or  Visit CDC's website at http://hunter.com/ CDC  Tdap Vaccine VIS (05/13/13) This information is not intended to replace advice given to you by your health care provider. Make sure you discuss any questions you have with your health care provider. Document Released: 09/05/2011 Document Revised: 11/25/2015 Document Reviewed: 11/25/2015 Elsevier Interactive Patient Education  2017 [employer].

## 2016-06-28 NOTE — Progress Notes (Signed)
   PRENATAL VISIT NOTE  Subjective:  Jade Mathis is a 41 y.o. [phone] at 65w5dbeing seen today for ongoing prenatal care.  She is currently monitored for the following issues for this high-risk pregnancy and has HYPOTHYROIDISM, POSTSURGICAL; Diabetes mellitus type 2 in obese ([place]; OBESITY, NOS; DEPRESSION, MAJOR, RECURRENT, MODERATE; Migraine; Myasthenia gravis ([place]; Asthma; GERD; OSA (obstructive sleep apnea); Pure hypercholesterolemia; Fibromyalgia; Stool incontinence; Ventral hernia; Preexisting diabetes complicating pregnancy, antepartum; Myasthenia gravis complicating pregnancy ([place]; Depression affecting pregnancy, antepartum; Hypothyroidism affecting pregnancy, antepartum; Advanced maternal age in multigravida; Supervision of high-risk pregnancy; and Diabetes mellitus affecting pregnancy, second trimester on her problem list.  Patient reports abdominal cramping. Concerned about PTL.   . Vag. Bleeding: None.  Movement: Present. Denies leaking of fluid.   The following portions of the patient's history were reviewed and updated as appropriate: allergies, current medications, past family history, past medical history, past social history, past surgical history and problem list. Problem list updated.  Objective:   Vitals:   06/28/16 1414  BP: 115/71  Pulse: (!) 104  Weight: 260 lb 1.6 oz (118 kg)    Fetal Status: Fetal Heart Rate (bpm): 154   Movement: Present     General:  Alert, oriented and cooperative. Patient is in no acute distress.  Skin: Skin is warm and dry. No rash noted.   Cardiovascular: Normal heart rate noted  Respiratory: Normal respiratory effort, no problems with respiration noted  Abdomen: Soft, gravid, appropriate for gestational age. Pain/Pressure: Present     Pelvic:  Cervical exam performed Dilation: Closed Effacement (%): Thick Station: Ballotable  Extremities: Normal range of motion.  Edema: Trace  Mental Status: Normal mood and affect. Normal behavior.  Normal judgment and thought content.   Assessment and Plan:  Pregnancy: [id number] 260w5d1. Pelvic pain affecting pregnancy in second trimester, antepartum Will follow up results and manage accordingly. - Culture, OB Urine - Cervicovaginal ancillary only  2. Preexisting diabetes complicating pregnancy, antepartum Elevated fastings in 120-140s, postprandials 120-180s. Already met with Nutritionist and Dr. KuDwyane DeeEndocrinology). They are setting her up for an insulin pump.  Will follow up their recommendations.   3. Hypothyroidism affecting pregnancy, antepartum Followed by specialist.  4. Myasthenia gravis complicating pregnancy ([place].  5. Supervision of high risk pregnancy in second trimester Preterm labor symptoms and general obstetric precautions including but not limited to vaginal bleeding, contractions, leaking of fluid and fetal movement were reviewed in detail with the patient. Please refer to After Visit Summary for other counseling recommendations.  Return in about 2 weeks (around 07/12/2016) for 3rd trimester labs, TDap.   UgOsborne OmanMD

## 2016-06-29 LAB — CERVICOVAGINAL ANCILLARY ONLY
Bacterial vaginitis: NEGATIVE
Candida vaginitis: NEGATIVE
Chlamydia: NEGATIVE
Neisseria Gonorrhea: NEGATIVE
Trichomonas: NEGATIVE

## 2016-06-30 LAB — URINE CULTURE, OB REFLEX

## 2016-06-30 LAB — CULTURE, OB URINE

## 2016-07-02 ENCOUNTER — Other Ambulatory Visit: Payer: Self-pay | Admitting: Endocrinology

## 2016-07-02 DIAGNOSIS — Z794 Long term (current) use of insulin: Principal | ICD-10-CM

## 2016-07-02 DIAGNOSIS — E1165 Type 2 diabetes mellitus with hyperglycemia: Secondary | ICD-10-CM

## 2016-07-03 ENCOUNTER — Ambulatory Visit (HOSPITAL_COMMUNITY)
Admission: RE | Admit: 2016-07-03 | Discharge: 2016-07-03 | Disposition: A | Discharge status: Home / Self Care | Payer: Medicare Other | Source: Ambulatory Visit | Attending: Obstetrics & Gynecology | Admitting: Obstetrics & Gynecology

## 2016-07-03 ENCOUNTER — Encounter (HOSPITAL_COMMUNITY): Payer: Self-pay

## 2016-07-03 ENCOUNTER — Other Ambulatory Visit (HOSPITAL_COMMUNITY): Payer: Self-pay | Admitting: *Deleted

## 2016-07-03 DIAGNOSIS — O0992 Supervision of high risk pregnancy, unspecified, second trimester: Secondary | ICD-10-CM

## 2016-07-03 DIAGNOSIS — Z3A26 26 weeks gestation of pregnancy: Secondary | ICD-10-CM | POA: Diagnosis not present

## 2016-07-03 DIAGNOSIS — O24112 Pre-existing diabetes mellitus, type 2, in pregnancy, second trimester: Secondary | ICD-10-CM | POA: Diagnosis present

## 2016-07-03 DIAGNOSIS — O99282 Endocrine, nutritional and metabolic diseases complicating pregnancy, second trimester: Secondary | ICD-10-CM | POA: Insufficient documentation

## 2016-07-03 DIAGNOSIS — O99280 Endocrine, nutritional and metabolic diseases complicating pregnancy, unspecified trimester: Secondary | ICD-10-CM

## 2016-07-03 DIAGNOSIS — G7000 Myasthenia gravis without (acute) exacerbation: Secondary | ICD-10-CM

## 2016-07-03 DIAGNOSIS — O09522 Supervision of elderly multigravida, second trimester: Secondary | ICD-10-CM | POA: Diagnosis not present

## 2016-07-03 DIAGNOSIS — E039 Hypothyroidism, unspecified: Secondary | ICD-10-CM

## 2016-07-03 DIAGNOSIS — O99212 Obesity complicating pregnancy, second trimester: Secondary | ICD-10-CM | POA: Insufficient documentation

## 2016-07-03 DIAGNOSIS — O24419 Gestational diabetes mellitus in pregnancy, unspecified control: Secondary | ICD-10-CM

## 2016-07-03 DIAGNOSIS — O99340 Other mental disorders complicating pregnancy, unspecified trimester: Secondary | ICD-10-CM

## 2016-07-03 DIAGNOSIS — O99350 Diseases of the nervous system complicating pregnancy, unspecified trimester: Secondary | ICD-10-CM

## 2016-07-03 DIAGNOSIS — F32A Depression, unspecified: Secondary | ICD-10-CM

## 2016-07-03 DIAGNOSIS — O24319 Unspecified pre-existing diabetes mellitus in pregnancy, unspecified trimester: Secondary | ICD-10-CM

## 2016-07-03 DIAGNOSIS — F329 Major depressive disorder, single episode, unspecified: Secondary | ICD-10-CM

## 2016-07-03 NOTE — Addendum Note (Signed)
Encounter addended by: Jeanene Erb on: 07/03/2016 11:13 AM<BR>    Actions taken: Imaging Exam ended

## 2016-07-06 ENCOUNTER — Ambulatory Visit (INDEPENDENT_AMBULATORY_CARE_PROVIDER_SITE_OTHER): Payer: Medicare Other | Admitting: Endocrinology

## 2016-07-06 ENCOUNTER — Other Ambulatory Visit: Payer: Self-pay

## 2016-07-06 ENCOUNTER — Telehealth: Payer: Self-pay | Admitting: Endocrinology

## 2016-07-06 ENCOUNTER — Encounter: Payer: Self-pay | Admitting: Endocrinology

## 2016-07-06 VITALS — BP 112/68 | HR 102 | Ht 69.0 in | Wt 261.0 lb

## 2016-07-06 DIAGNOSIS — Z794 Long term (current) use of insulin: Secondary | ICD-10-CM | POA: Diagnosis not present

## 2016-07-06 DIAGNOSIS — E1165 Type 2 diabetes mellitus with hyperglycemia: Secondary | ICD-10-CM

## 2016-07-06 DIAGNOSIS — L600 Ingrowing nail: Secondary | ICD-10-CM | POA: Diagnosis not present

## 2016-07-06 MED ORDER — GLUCERNA SHAKE PO LIQD: 237.0000 mL | Freq: Three times a day (TID) | ORAL | 11 refills | Status: DC

## 2016-07-06 MED ORDER — INSULIN ISOPHANE HUMAN 100 UNIT/ML KWIKPEN: PEN_INJECTOR | SUBCUTANEOUS | 1 refills | Status: DC

## 2016-07-06 NOTE — Telephone Encounter (Signed)
Health dept is calling on the status of form that was sent over for.  Please advise

## 2016-07-06 NOTE — Telephone Encounter (Signed)
[facility] called and said the the [place] is not covered under insurance and that they are requesting a change in prescription.
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
## 2016-07-06 NOTE — Telephone Encounter (Signed)
This was addressed in the visit with the patient she will be contacting outside source for coverage

## 2016-07-06 NOTE — Patient Instructions (Signed)
LEVEMIR insulin:  MORNING doses will be 34 units daily EVENING dose will be 80 units  NEW insulin: Humulin N to be taken at BEDTIME starting with 24 units and adjust by 2 units every 3 days as on the flow sheet to bring morning sugar down below 90  HUMALOG before meals: 22 units at breakfast, 24 units at lunch and 28 units at dinner to be taken 10-15 minutes before eating preferably If eating more starchy food add another 4 units Blood sugars should not be over 1 40 after eating  Check more readings after supper

## 2016-07-06 NOTE — Telephone Encounter (Signed)
This was received and faxed same day- 07/06/16

## 2016-07-06 NOTE — Telephone Encounter (Signed)
Patient ask you to call her please. pb

## 2016-07-07 ENCOUNTER — Ambulatory Visit (INDEPENDENT_AMBULATORY_CARE_PROVIDER_SITE_OTHER): Payer: Medicare Other | Admitting: Podiatry

## 2016-07-07 ENCOUNTER — Encounter: Payer: Self-pay | Admitting: Endocrinology

## 2016-07-07 ENCOUNTER — Encounter: Payer: Self-pay | Admitting: Podiatry

## 2016-07-07 DIAGNOSIS — E0842 Diabetes mellitus due to underlying condition with diabetic polyneuropathy: Secondary | ICD-10-CM

## 2016-07-07 DIAGNOSIS — Q6650 Congenital pes planus, unspecified foot: Secondary | ICD-10-CM

## 2016-07-07 DIAGNOSIS — L600 Ingrowing nail: Secondary | ICD-10-CM

## 2016-07-07 NOTE — Patient Instructions (Signed)
Soak Instructions    THE DAY AFTER THE PROCEDURE  Place 1/4 cup of epsom salts in a quart of warm tap water.  Submerge your foot or feet with outer bandage intact for the initial soak; this will allow the bandage to become moist and wet for easy lift off.  Once you remove your bandage, continue to soak in the solution for 20 minutes.  This soak should be done twice a day.  Next, remove your foot or feet from solution, blot dry the affected area and cover.  You may use a band aid large enough to cover the area or use gauze and tape.  Apply other medications to the area as directed by the doctor such as polysporin neosporin.  IF YOUR SKIN BECOMES IRRITATED WHILE USING THESE INSTRUCTIONS, IT IS OKAY TO SWITCH TO  WHITE VINEGAR AND WATER. Or you may use antibacterial soap and water to keep the toe clean  Monitor for any signs/symptoms of infection. Call the office immediately if any occur or go directly to the emergency room. Call with any questions/concerns.   You can try capsaicin cream for your feet. Read the label to make sure you can use this with pregnancy.

## 2016-07-09 ENCOUNTER — Other Ambulatory Visit: Payer: Self-pay | Admitting: Endocrinology

## 2016-07-10 ENCOUNTER — Other Ambulatory Visit: Payer: Self-pay

## 2016-07-10 ENCOUNTER — Telehealth: Payer: Self-pay | Admitting: Nutrition

## 2016-07-10 NOTE — Telephone Encounter (Signed)
Patient stated she was hurting from her insulin shots, burning sensations that forming in knots, please advise

## 2016-07-10 NOTE — Progress Notes (Signed)
Subjective: 41 year old female presents the office today for concerns of ingrown turns the left medial hallux which started after she got a pedicure. Since she had a pedicures been sore. She denies any drainage or pus. There is painful with pressure in shoes. She had no recent treatment for this otherwise. Last A1c was 6.5. She is asking for an use. Diabetic shoes today. Denies any systemic complaints such as fevers, chills, nausea, vomiting. No acute changes since last appointment, and no other complaints at this time.   *She is currently [redacted] weeks pregnant  Objective: AAO x3, NAD DP/PT pulses palpable bilaterally, CRT less than 3 seconds Sensation decreased with Sims once the monofilament There is incurvation on medial aspect left hallux toenail tenderness palpation. The tenderness of the distal last of the nail but is no pain on the proximal nail border. There is no edema, erythema, drainage or pus there is no other clinical signs of infection. Flatfoot present No open lesions or pre-ulcerative lesions.  No pain with calf compression, swelling, warmth, erythema  Assessment: 41 year old female left medial hallux ingrown toenail; diabetic neuropathy asking for new diabetic shoes  Plan: -All treatment options discussed with the patient including all alternatives, risks, complications.  -Discussed the treatment option ingrown toenail. States his of the ingrown toenail was debrided and slant back procedure in order to remove the symptomatic portion ingrown toenail. Discussed the symptoms continue she will likely need a partial nail avulsion. As she is pregnant unable to chemical matricectomy. -Paperwork completed today for diabetic shoe precertification. -Daily foot inspection discussed -Patient encouraged to call the office with any questions, concerns, change in symptoms.   Celesta Gentile, DPM

## 2016-07-10 NOTE — Telephone Encounter (Signed)
Advised patient to increase the NPH insulin to 34 units instead of 28 Also please fax a prescription for the freestyle Libre sensor and meter to [facility]

## 2016-07-10 NOTE — Telephone Encounter (Signed)
Pt. Called to check on the status of her insulin pump.  I gave her Mickel Baas Hite's telephone number to call her about this.   FBSs the last 2 days 155 yesterday and 139 today.  She is taking 26u of NPH at HS I told her to increase this dose to 28u.

## 2016-07-11 ENCOUNTER — Other Ambulatory Visit: Payer: Self-pay

## 2016-07-11 MED ORDER — FREESTYLE LIBRE READER DEVI: 1.0000 | 1 refills | Status: DC

## 2016-07-11 MED ORDER — FREESTYLE LIBRE SENSOR SYSTEM MISC: 4 refills | Status: DC

## 2016-07-11 NOTE — Telephone Encounter (Signed)
Phone call to check the status of her pump.  Told her that she needs to contact [employer] herself   Gave number.  Said FBS today was 166, but now is 223 acL.  Had only one piece of bread.  At breakfast, and water to drink

## 2016-07-11 NOTE — Telephone Encounter (Signed)
Phoned patient and told her that the orders for her pump were faxed in to [facility], and that she should hear from them in 24 hours.  She will call me when her pump comes in. Confirmed that she is take the above insulin dose.

## 2016-07-11 NOTE — Telephone Encounter (Signed)
Please confirm that she is taking 80 units of Levemir and 34 units of NPH at night If so she will need to increase her Levemir to 94 units What is her blood sugar reading at bedtime? Also increase her Humalog by 4 units at every meal

## 2016-07-11 NOTE — Telephone Encounter (Signed)
This has been done.

## 2016-07-11 NOTE — Telephone Encounter (Signed)
Spoke with patient and she is taking: Levemir- 100 units at night NPH- 34units Humalog- 22 am, 24 lunch, 26 supper Her blood sugar reading last night was 159 she thinks- she was unable to give me exact reading because she was not at home- patient states she will call back as soon as she can

## 2016-07-12 ENCOUNTER — Ambulatory Visit (INDEPENDENT_AMBULATORY_CARE_PROVIDER_SITE_OTHER): Payer: Medicare Other | Admitting: Obstetrics & Gynecology

## 2016-07-12 VITALS — BP 114/57 | HR 115 | Wt 263.5 lb

## 2016-07-12 DIAGNOSIS — O0990 Supervision of high risk pregnancy, unspecified, unspecified trimester: Secondary | ICD-10-CM

## 2016-07-12 DIAGNOSIS — E669 Obesity, unspecified: Secondary | ICD-10-CM

## 2016-07-12 DIAGNOSIS — O24912 Unspecified diabetes mellitus in pregnancy, second trimester: Secondary | ICD-10-CM

## 2016-07-12 DIAGNOSIS — Z794 Long term (current) use of insulin: Principal | ICD-10-CM

## 2016-07-12 DIAGNOSIS — E1165 Type 2 diabetes mellitus with hyperglycemia: Secondary | ICD-10-CM | POA: Insufficient documentation

## 2016-07-12 DIAGNOSIS — G7000 Myasthenia gravis without (acute) exacerbation: Secondary | ICD-10-CM

## 2016-07-12 DIAGNOSIS — E1169 Type 2 diabetes mellitus with other specified complication: Secondary | ICD-10-CM

## 2016-07-12 DIAGNOSIS — O99352 Diseases of the nervous system complicating pregnancy, second trimester: Secondary | ICD-10-CM

## 2016-07-12 DIAGNOSIS — Z23 Encounter for immunization: Secondary | ICD-10-CM | POA: Diagnosis not present

## 2016-07-12 DIAGNOSIS — IMO0002 Reserved for concepts with insufficient information to code with codable children: Secondary | ICD-10-CM

## 2016-07-12 DIAGNOSIS — E1121 Type 2 diabetes mellitus with diabetic nephropathy: Secondary | ICD-10-CM

## 2016-07-12 DIAGNOSIS — O0992 Supervision of high risk pregnancy, unspecified, second trimester: Secondary | ICD-10-CM

## 2016-07-12 DIAGNOSIS — O99350 Diseases of the nervous system complicating pregnancy, unspecified trimester: Secondary | ICD-10-CM

## 2016-07-12 LAB — POCT URINALYSIS DIP (DEVICE)
Bilirubin Urine: NEGATIVE
Glucose, UA: 100 mg/dL — AB
Hgb urine dipstick: NEGATIVE
Ketones, ur: NEGATIVE mg/dL
Leukocytes, UA: NEGATIVE
Nitrite: NEGATIVE
Protein, ur: NEGATIVE mg/dL
Specific Gravity, Urine: >=1.03 (ref 1.005–1.030)
Urobilinogen, UA: 1 mg/dL (ref 0.0–1.0)
pH: 5.5 (ref 5.0–8.0)

## 2016-07-12 MED ORDER — TETANUS-DIPHTH-ACELL PERTUSSIS 5-2.5-18.5 LF-MCG/0.5 IM SUSP
0.5000 mL | Freq: Once | INTRAMUSCULAR | Status: AC
  Administered 2016-07-12: 0.5 mL via INTRAMUSCULAR

## 2016-07-12 NOTE — Patient Instructions (Signed)
Return to clinic for any scheduled appointments or obstetric concerns, or go to MAU for evaluation  

## 2016-07-12 NOTE — Progress Notes (Signed)
   PRENATAL VISIT NOTE  Subjective:  Jade Mathis is a 41 y.o. [phone] at [redacted]w[redacted]d being seen today for ongoing prenatal care.  She is currently monitored for the following issues for this high-risk pregnancy and has HYPOTHYROIDISM, POSTSURGICAL; Diabetes mellitus type 2 in obese ([place]); OBESITY, NOS; DEPRESSION, MAJOR, RECURRENT, MODERATE; Migraine; Myasthenia gravis ([place]); Asthma; GERD; OSA (obstructive sleep apnea); Pure hypercholesterolemia; Fibromyalgia; Stool incontinence; Ventral hernia; Preexisting diabetes complicating pregnancy, antepartum; Myasthenia gravis complicating pregnancy ([place]); Depression affecting pregnancy, antepartum; Hypothyroidism affecting pregnancy, antepartum; Advanced maternal age in multigravida; Supervision of high-risk pregnancy; Diabetes mellitus affecting pregnancy, second trimester; and Type 2 diabetes mellitus, uncontrolled ([place]) on her problem list.  Patient reports no complaints.  Contractions: Irritability. Vag. Bleeding: None.  Movement: Present. Denies leaking of fluid.   The following portions of the patient's history were reviewed and updated as appropriate: allergies, current medications, past family history, past medical history, past social history, past surgical history and problem list. Problem list updated.  Objective:   Vitals:   07/12/16 1545  BP: (!) 114/57  Pulse: (!) 115  Weight: 263 lb 8 oz (119.5 kg)    Fetal Status: Fetal Heart Rate (bpm): 154   Movement: Present     General:  Alert, oriented and cooperative. Patient is in no acute distress.  Skin: Skin is warm and dry. No rash noted.   Cardiovascular: Normal heart rate noted  Respiratory: Normal respiratory effort, no problems with respiration noted  Abdomen: Soft, gravid, appropriate for gestational age. Pain/Pressure: Present     Pelvic:  Cervical exam deferred        Extremities: Normal range of motion.  Edema: Moderate pitting, indentation subsides rapidly  Mental Status: Normal  mood and affect. Normal behavior. Normal judgment and thought content.   Assessment and Plan:  Pregnancy: [id number] at [redacted]w[redacted]d  1. Diabetes mellitus affecting pregnancy, second trimester 2. Diabetes mellitus type 2 in obese ([place]) 3. Uncontrolled type 2 diabetes mellitus with diabetic nephropathy, with long-term current use of insulin ([facility]) Still elevated BS, actively managed by Dr. Dwyane Dee (Endocrinologist).  Will get weekly BPPs starting next week due to poor control.  - [place] MFM FETAL BPP WO NON STRESS; Future - [place] MFM FETAL BPP WO NON STRESS; Future - [place] MFM FETAL BPP WO NON STRESS; Future - [place] MFM FETAL BPP WO NON STRESS; Future - [place] MFM FETAL BPP WO NON STRESS; Future - [place] MFM FETAL BPP WO NON STRESS; Future  4. Myasthenia gravis complicating pregnancy ([place]) Followed by specialist.  5. Supervision of high risk pregnancy, antepartum Third trimester labs and Tdap today. - RPR - CBC - HIV antibody (with reflex) - Tdap (BOOSTRIX) injection 0.5 mL; Inject 0.5 mLs into the muscle once. - POCT urinalysis dip (device) Preterm labor symptoms and general obstetric precautions including but not limited to vaginal bleeding, contractions, leaking of fluid and fetal movement were reviewed in detail with the patient. Please refer to After Visit Summary for other counseling recommendations.  Return in about 2 weeks (around 07/26/2016) for OB Visit.   Osborne Oman, MD

## 2016-07-12 NOTE — Progress Notes (Signed)
Pt BPP's scheduled for the next few weeks.  Pt informed of appt times.

## 2016-07-13 ENCOUNTER — Ambulatory Visit: Payer: Medicare Other | Admitting: Podiatry

## 2016-07-13 DIAGNOSIS — I10 Essential (primary) hypertension: Secondary | ICD-10-CM | POA: Diagnosis not present

## 2016-07-13 DIAGNOSIS — G7000 Myasthenia gravis without (acute) exacerbation: Secondary | ICD-10-CM | POA: Diagnosis not present

## 2016-07-13 DIAGNOSIS — G43709 Chronic migraine without aura, not intractable, without status migrainosus: Secondary | ICD-10-CM | POA: Diagnosis not present

## 2016-07-13 LAB — HIV ANTIBODY (ROUTINE TESTING W REFLEX): HIV Screen 4th Generation wRfx: NONREACTIVE

## 2016-07-13 LAB — CBC
Hematocrit: 32 % — ABNORMAL LOW (ref 34.0–46.6)
Hemoglobin: 10 g/dL — ABNORMAL LOW (ref 11.1–15.9)
MCH: 25.9 pg — ABNORMAL LOW (ref 26.6–33.0)
MCHC: 31.3 g/dL — ABNORMAL LOW (ref 31.5–35.7)
MCV: 83 fL (ref 79–97)
Platelets: 216 10*3/uL (ref 150–379)
RBC: 3.86 x10E6/uL (ref 3.77–5.28)
RDW: 14.2 % (ref 12.3–15.4)
WBC: 8.5 10*3/uL (ref 3.4–10.8)

## 2016-07-13 LAB — RPR: RPR Ser Ql: NONREACTIVE

## 2016-07-14 ENCOUNTER — Encounter: Payer: Self-pay | Admitting: Podiatry

## 2016-07-14 ENCOUNTER — Ambulatory Visit (INDEPENDENT_AMBULATORY_CARE_PROVIDER_SITE_OTHER): Payer: Medicare Other | Admitting: Podiatry

## 2016-07-14 DIAGNOSIS — E0842 Diabetes mellitus due to underlying condition with diabetic polyneuropathy: Secondary | ICD-10-CM | POA: Diagnosis not present

## 2016-07-14 DIAGNOSIS — L600 Ingrowing nail: Secondary | ICD-10-CM

## 2016-07-14 NOTE — Patient Instructions (Signed)

## 2016-07-16 NOTE — Progress Notes (Signed)
Subjective: Patient presents today for continued pain from an ingrown toenail that we debrided last week. She states that the pain is better but she still has discomfort to the toe. Denies any swelling, redness, drainage. She does not want to have a procedure done to remove the nail border as she states that other side made the nail "ugly".  Denies any systemic complaints such as fevers, chills, nausea, vomiting. No acute changes since last appointment, and no other complaints at this time.   Objective: AAO x3, NAD DP/PT pulses palpable bilaterally, CRT less than 3 seconds Continued ingrown toenail along the medial nail border of the left hallux. There is no swelling, redness, drainage, pus. There is no signs of infection. .  No open lesions or pre-ulcerative lesions.  No pain with calf compression, swelling, warmth, erythema  Assessment: Ingrown toenail left hallux  Plan: -All treatment options discussed with the patient including all alternatives, risks, complications.  -Declines partial nail avulsion.  -I sharply debrided as much as I could of the symptomatic portion of the ingrown toenail without any complications or bleeding. Epsom salt soaks.  -RTC if if pain is not improved. Monitor for infection.  -Patient encouraged to call the office with any questions, concerns, change in symptoms.   Celesta Gentile, DPM

## 2016-07-17 ENCOUNTER — Telehealth: Payer: Self-pay | Admitting: *Deleted

## 2016-07-17 ENCOUNTER — Ambulatory Visit: Payer: Self-pay | Admitting: Endocrinology

## 2016-07-17 ENCOUNTER — Encounter: Payer: Self-pay | Admitting: Endocrinology

## 2016-07-17 NOTE — Telephone Encounter (Signed)
Called and spoke with patient explained that Dr Dwyane Dee has again denied her diabetic shoes stating he does not agree with Dr Leigh Aurora sensory finding.  Patient to call Dr Dwyane Dee to discuss.

## 2016-07-18 ENCOUNTER — Encounter: Payer: Self-pay | Admitting: Endocrinology

## 2016-07-18 NOTE — Telephone Encounter (Signed)
Pt is asking for the reasoning to why the shoes are not being approved by [place]

## 2016-07-20 ENCOUNTER — Other Ambulatory Visit: Payer: Self-pay | Admitting: Obstetrics & Gynecology

## 2016-07-20 ENCOUNTER — Ambulatory Visit (HOSPITAL_COMMUNITY)
Admission: RE | Admit: 2016-07-20 | Discharge: 2016-07-20 | Disposition: A | Discharge status: Home / Self Care | Payer: Medicare Other | Source: Ambulatory Visit | Attending: Obstetrics & Gynecology | Admitting: Obstetrics & Gynecology

## 2016-07-20 ENCOUNTER — Telehealth: Payer: Self-pay | Admitting: Podiatry

## 2016-07-20 ENCOUNTER — Encounter (HOSPITAL_COMMUNITY): Payer: Self-pay

## 2016-07-20 ENCOUNTER — Ambulatory Visit (INDEPENDENT_AMBULATORY_CARE_PROVIDER_SITE_OTHER): Payer: Medicare Other | Admitting: Endocrinology

## 2016-07-20 ENCOUNTER — Encounter: Payer: Self-pay | Admitting: Obstetrics & Gynecology

## 2016-07-20 ENCOUNTER — Encounter: Payer: Self-pay | Admitting: Endocrinology

## 2016-07-20 ENCOUNTER — Other Ambulatory Visit (HOSPITAL_COMMUNITY): Payer: Self-pay | Admitting: Obstetrics and Gynecology

## 2016-07-20 ENCOUNTER — Ambulatory Visit: Payer: Self-pay | Admitting: Endocrinology

## 2016-07-20 VITALS — BP 104/64 | HR 98 | Ht 69.0 in | Wt 266.4 lb

## 2016-07-20 DIAGNOSIS — Z3A28 28 weeks gestation of pregnancy: Secondary | ICD-10-CM

## 2016-07-20 DIAGNOSIS — O09523 Supervision of elderly multigravida, third trimester: Secondary | ICD-10-CM | POA: Diagnosis present

## 2016-07-20 DIAGNOSIS — R600 Localized edema: Secondary | ICD-10-CM

## 2016-07-20 DIAGNOSIS — E063 Autoimmune thyroiditis: Secondary | ICD-10-CM | POA: Diagnosis not present

## 2016-07-20 DIAGNOSIS — E1165 Type 2 diabetes mellitus with hyperglycemia: Secondary | ICD-10-CM

## 2016-07-20 DIAGNOSIS — E039 Hypothyroidism, unspecified: Secondary | ICD-10-CM | POA: Diagnosis not present

## 2016-07-20 DIAGNOSIS — Z794 Long term (current) use of insulin: Secondary | ICD-10-CM | POA: Diagnosis not present

## 2016-07-20 DIAGNOSIS — Z3A32 32 weeks gestation of pregnancy: Secondary | ICD-10-CM | POA: Diagnosis not present

## 2016-07-20 DIAGNOSIS — O24913 Unspecified diabetes mellitus in pregnancy, third trimester: Secondary | ICD-10-CM

## 2016-07-20 DIAGNOSIS — O09893 Supervision of other high risk pregnancies, third trimester: Secondary | ICD-10-CM | POA: Insufficient documentation

## 2016-07-20 DIAGNOSIS — Z3A30 30 weeks gestation of pregnancy: Secondary | ICD-10-CM

## 2016-07-20 DIAGNOSIS — O09522 Supervision of elderly multigravida, second trimester: Secondary | ICD-10-CM

## 2016-07-20 DIAGNOSIS — O24912 Unspecified diabetes mellitus in pregnancy, second trimester: Secondary | ICD-10-CM

## 2016-07-20 DIAGNOSIS — O99212 Obesity complicating pregnancy, second trimester: Secondary | ICD-10-CM

## 2016-07-20 DIAGNOSIS — E1121 Type 2 diabetes mellitus with diabetic nephropathy: Secondary | ICD-10-CM | POA: Diagnosis not present

## 2016-07-20 DIAGNOSIS — O24113 Pre-existing diabetes mellitus, type 2, in pregnancy, third trimester: Secondary | ICD-10-CM | POA: Insufficient documentation

## 2016-07-20 DIAGNOSIS — O99213 Obesity complicating pregnancy, third trimester: Secondary | ICD-10-CM | POA: Insufficient documentation

## 2016-07-20 DIAGNOSIS — IMO0002 Reserved for concepts with insufficient information to code with codable children: Secondary | ICD-10-CM

## 2016-07-20 DIAGNOSIS — Z3A29 29 weeks gestation of pregnancy: Secondary | ICD-10-CM

## 2016-07-20 DIAGNOSIS — O99282 Endocrine, nutritional and metabolic diseases complicating pregnancy, second trimester: Secondary | ICD-10-CM

## 2016-07-20 DIAGNOSIS — O368190 Decreased fetal movements, unspecified trimester, not applicable or unspecified: Secondary | ICD-10-CM | POA: Diagnosis not present

## 2016-07-20 DIAGNOSIS — O368120 Decreased fetal movements, second trimester, not applicable or unspecified: Secondary | ICD-10-CM

## 2016-07-20 DIAGNOSIS — E669 Obesity, unspecified: Secondary | ICD-10-CM | POA: Diagnosis not present

## 2016-07-20 DIAGNOSIS — O99283 Endocrine, nutritional and metabolic diseases complicating pregnancy, third trimester: Secondary | ICD-10-CM | POA: Insufficient documentation

## 2016-07-20 DIAGNOSIS — O24112 Pre-existing diabetes mellitus, type 2, in pregnancy, second trimester: Secondary | ICD-10-CM

## 2016-07-20 DIAGNOSIS — Z3A31 31 weeks gestation of pregnancy: Secondary | ICD-10-CM

## 2016-07-20 NOTE — Telephone Encounter (Signed)
Pt called asking if you could please refax the diabetic shoe paperwork to Dr Ronnie Derby office again. She seen him today and they said they need the paperwork for him to sign so she is able to get the diabetic shoes.

## 2016-07-20 NOTE — Patient Instructions (Addendum)
  LEVEMIR insulin:  MORNING doses will be 44  units daily EVENING dose will be 110 units   Humulin N to be taken at BEDTIME starting with 30 units   HUMALOG before meals: 22 units at breakfast, 30 units at lunch and 30 units at dinner to be taken 10-15 minutes before eating preferably
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
## 2016-07-24 ENCOUNTER — Ambulatory Visit (INDEPENDENT_AMBULATORY_CARE_PROVIDER_SITE_OTHER): Payer: Medicare Other | Admitting: Pulmonary Disease

## 2016-07-24 ENCOUNTER — Encounter: Payer: Self-pay | Admitting: Pulmonary Disease

## 2016-07-24 VITALS — BP 118/78 | HR 105 | Ht 66.5 in | Wt 265.8 lb

## 2016-07-24 DIAGNOSIS — G4733 Obstructive sleep apnea (adult) (pediatric): Secondary | ICD-10-CM | POA: Diagnosis not present

## 2016-07-24 NOTE — Progress Notes (Signed)
   Subjective:    Patient ID: Jade Mathis, female    DOB: [birth year], 41 y.o.   MRN: [id number]  HPI    Review of Systems  Constitutional: Negative for fever and unexpected weight change.  HENT: Positive for trouble swallowing. Negative for congestion, dental problem, ear pain, nosebleeds, postnasal drip, rhinorrhea, sinus pressure, sneezing and sore throat.   Eyes: Negative for redness and itching.  Respiratory: Positive for shortness of breath. Negative for cough, chest tightness and wheezing.   Cardiovascular: Positive for chest pain. Negative for palpitations and leg swelling.  Gastrointestinal: Negative for nausea and vomiting.       Acid heartburn  Genitourinary: Negative for dysuria.  Musculoskeletal: Positive for arthralgias and joint swelling.  Skin: Positive for rash.  Neurological: Positive for headaches.  Hematological: Does not bruise/bleed easily.  Psychiatric/Behavioral: Positive for dysphoric mood. The patient is nervous/anxious.        Objective:   Physical Exam        Assessment & Plan:

## 2016-07-24 NOTE — Patient Instructions (Signed)
Will arrange for home sleep study Will call to arrange for follow up after sleep study reviewed  
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
## 2016-07-25 ENCOUNTER — Telehealth: Payer: Self-pay | Admitting: Podiatry

## 2016-07-25 NOTE — Telephone Encounter (Signed)
Pt calling to see if Dr Dwyane Dee has resent her diabetic shoe authorization.Please call pt and let her know.

## 2016-07-25 NOTE — Telephone Encounter (Signed)
Checked in [place] Dr Dwyane Dee resigned paperwork shoes and inserts have been ordered.  Called patient and relayed this information.

## 2016-07-26 DIAGNOSIS — M5412 Radiculopathy, cervical region: Secondary | ICD-10-CM | POA: Diagnosis not present

## 2016-07-26 DIAGNOSIS — Z79899 Other long term (current) drug therapy: Secondary | ICD-10-CM | POA: Diagnosis not present

## 2016-07-27 ENCOUNTER — Inpatient Hospital Stay (HOSPITAL_COMMUNITY): Admission: RE | Admit: 2016-07-27 | Payer: Self-pay | Source: Ambulatory Visit

## 2016-07-27 ENCOUNTER — Encounter (HOSPITAL_COMMUNITY): Payer: Self-pay

## 2016-07-27 ENCOUNTER — Ambulatory Visit (HOSPITAL_COMMUNITY)
Admission: RE | Admit: 2016-07-27 | Discharge: 2016-07-27 | Disposition: A | Discharge status: Home / Self Care | Payer: Medicare Other | Source: Ambulatory Visit | Attending: Obstetrics & Gynecology | Admitting: Obstetrics & Gynecology

## 2016-07-27 DIAGNOSIS — O99283 Endocrine, nutritional and metabolic diseases complicating pregnancy, third trimester: Secondary | ICD-10-CM | POA: Insufficient documentation

## 2016-07-27 DIAGNOSIS — Z3689 Encounter for other specified antenatal screening: Secondary | ICD-10-CM | POA: Insufficient documentation

## 2016-07-27 DIAGNOSIS — E669 Obesity, unspecified: Secondary | ICD-10-CM | POA: Diagnosis not present

## 2016-07-27 DIAGNOSIS — O99213 Obesity complicating pregnancy, third trimester: Secondary | ICD-10-CM | POA: Insufficient documentation

## 2016-07-27 DIAGNOSIS — Z3A29 29 weeks gestation of pregnancy: Secondary | ICD-10-CM | POA: Insufficient documentation

## 2016-07-27 DIAGNOSIS — E039 Hypothyroidism, unspecified: Secondary | ICD-10-CM | POA: Diagnosis not present

## 2016-07-27 DIAGNOSIS — Z6841 Body Mass Index (BMI) 40.0 and over, adult: Secondary | ICD-10-CM | POA: Insufficient documentation

## 2016-07-27 DIAGNOSIS — O09523 Supervision of elderly multigravida, third trimester: Secondary | ICD-10-CM | POA: Diagnosis not present

## 2016-07-27 DIAGNOSIS — O24113 Pre-existing diabetes mellitus, type 2, in pregnancy, third trimester: Secondary | ICD-10-CM | POA: Diagnosis not present

## 2016-07-27 DIAGNOSIS — Z794 Long term (current) use of insulin: Secondary | ICD-10-CM | POA: Insufficient documentation

## 2016-07-27 DIAGNOSIS — E119 Type 2 diabetes mellitus without complications: Secondary | ICD-10-CM | POA: Diagnosis not present

## 2016-07-28 ENCOUNTER — Ambulatory Visit (INDEPENDENT_AMBULATORY_CARE_PROVIDER_SITE_OTHER): Payer: Medicare Other | Admitting: Obstetrics & Gynecology

## 2016-07-28 VITALS — BP 112/59 | HR 99 | Wt 264.5 lb

## 2016-07-28 DIAGNOSIS — O24319 Unspecified pre-existing diabetes mellitus in pregnancy, unspecified trimester: Secondary | ICD-10-CM

## 2016-07-28 DIAGNOSIS — F331 Major depressive disorder, recurrent, moderate: Secondary | ICD-10-CM

## 2016-07-28 LAB — POCT URINALYSIS DIP (DEVICE)
Glucose, UA: NEGATIVE mg/dL
Hgb urine dipstick: NEGATIVE
Ketones, ur: NEGATIVE mg/dL
Leukocytes, UA: NEGATIVE
Nitrite: NEGATIVE
Protein, ur: NEGATIVE mg/dL
Specific Gravity, Urine: 1.025 (ref 1.005–1.030)
Urobilinogen, UA: 2 mg/dL — ABNORMAL HIGH (ref 0.0–1.0)
pH: 6.5 (ref 5.0–8.0)

## 2016-07-28 MED ORDER — INSULIN ISOPHANE HUMAN 100 UNIT/ML KWIKPEN: PEN_INJECTOR | SUBCUTANEOUS | 1 refills | Status: DC

## 2016-07-28 MED ORDER — INSULIN DETEMIR 100 UNIT/ML FLEXPEN: PEN_INJECTOR | SUBCUTANEOUS | 11 refills | Status: DC

## 2016-07-28 MED ORDER — BUPROPION HCL ER (XL) 150 MG PO TB24: 300.0000 mg | ORAL_TABLET | Freq: Every day | ORAL | Status: DC

## 2016-07-28 NOTE — Progress Notes (Signed)
Anxiety and difficulty sleeping   PRENATAL VISIT NOTE  Subjective:  Jade Mathis is a 41 y.o. [id number] at [redacted]w[redacted]d being seen today for ongoing prenatal care.  She is currently monitored for the following issues for this high-risk pregnancy and has HYPOTHYROIDISM, POSTSURGICAL; Diabetes mellitus type 2 in obese ([place]); OBESITY, NOS; DEPRESSION, MAJOR, RECURRENT, MODERATE; Migraine; Myasthenia gravis ([place]); Asthma; GERD; OSA (obstructive sleep apnea); Pure hypercholesterolemia; Fibromyalgia; Stool incontinence; Ventral hernia; Preexisting diabetes complicating pregnancy, antepartum; Myasthenia gravis complicating pregnancy ([place]); Depression affecting pregnancy, antepartum; Hypothyroidism affecting pregnancy, antepartum; Advanced maternal age in multigravida; Supervision of high-risk pregnancy; Diabetes mellitus affecting pregnancy, second trimester; and Type 2 diabetes mellitus, uncontrolled ([place]) on her problem list.  Patient reports sleep .  Contractions: Not present. Vag. Bleeding: None.  Movement: Present. Denies leaking of fluid.   The following portions of the patient's history were reviewed and updated as appropriate: allergies, current medications, past family history, past medical history, past social history, past surgical history and problem list. Problem list updated.  Objective:   Vitals:   07/28/16 0805  BP: (!) 112/59  Pulse: 99  Weight: 120 kg (264 lb 8 oz)    Fetal Status: Fetal Heart Rate (bpm): 140   Movement: Present     General:  Alert, oriented and cooperative. Patient is in no acute distress.  Skin: Skin is warm and dry. No rash noted.   Cardiovascular: Normal heart rate noted  Respiratory: Normal respiratory effort, no problems with respiration noted  Abdomen: Soft, gravid, appropriate for gestational age. Pain/Pressure: Present     Pelvic:  Cervical exam deferred        Extremities: Normal range of motion.  Edema: Moderate pitting, indentation subsides rapidly  Mental  Status: Normal mood and affect. Normal behavior. Normal judgment and thought content.   Assessment and Plan:  Pregnancy: [id number] at [redacted]w[redacted]d  1. Preexisting diabetes complicating pregnancy, antepartum FBS most >95 up to 138, PP most >140, increased NPH PM - Insulin Detemir (LEVEMIR) 100 UNIT/ML Pen; 34 units in the morning and 110 units at bedtime.  Dispense: 15 mL; Refill: 11  2. DEPRESSION, MAJOR, RECURRENT, MODERATE I hope this will help sleep - buPROPion (WELLBUTRIN XL) 24 hr tablet 300 mg; Take 2 tablets (300 mg total) by mouth daily.  Preterm labor symptoms and general obstetric precautions including but not limited to vaginal bleeding, contractions, leaking of fluid and fetal movement were reviewed in detail with the patient. Please refer to After Visit Summary for other counseling recommendations.  Return in about 1 week (around 08/04/2016).   Woodroe Mode, MD

## 2016-07-28 NOTE — Patient Instructions (Signed)

## 2016-07-31 ENCOUNTER — Ambulatory Visit (INDEPENDENT_AMBULATORY_CARE_PROVIDER_SITE_OTHER): Payer: Medicare Other | Admitting: Family Medicine

## 2016-07-31 VITALS — BP 114/77 | HR 93 | Wt 265.5 lb

## 2016-07-31 DIAGNOSIS — K429 Umbilical hernia without obstruction or gangrene: Secondary | ICD-10-CM

## 2016-07-31 DIAGNOSIS — S86892A Other injury of other muscle(s) and tendon(s) at lower leg level, left leg, initial encounter: Secondary | ICD-10-CM

## 2016-07-31 DIAGNOSIS — O0990 Supervision of high risk pregnancy, unspecified, unspecified trimester: Secondary | ICD-10-CM

## 2016-07-31 LAB — POCT URINALYSIS DIP (DEVICE)
Glucose, UA: 100 mg/dL — AB
Hgb urine dipstick: NEGATIVE
Leukocytes, UA: NEGATIVE
Nitrite: NEGATIVE
Protein, ur: NEGATIVE mg/dL
Specific Gravity, Urine: >=1.03 (ref 1.005–1.030)
Urobilinogen, UA: 1 mg/dL (ref 0.0–1.0)
pH: 5.5 (ref 5.0–8.0)

## 2016-07-31 NOTE — Patient Instructions (Signed)
Shin Splints Rehab Ask your health care provider which exercises are safe for you. Do exercises exactly as told by your health care provider and adjust them as directed. It is normal to feel mild stretching, pulling, tightness, or discomfort as you do these exercises, but you should stop right away if you feel sudden pain or your pain gets worse.Do not begin these exercises until told by your health care provider. Stretching and range of motion exercise This exercise warms up your muscles and joints and improves the movement and flexibility of your lower leg. This exercise also helps to relieve pain, numbness, and tingling. Exercise A: Calf stretch, standing   1. Stand with the ball of your left / right foot on a step. The ball of your foot is on the walking surface, right under your toes. 2. Keep your other foot firmly on the same step. 3. Hold onto the wall, a railing, or a chair for balance. 4. Slowly lift your other foot, allowing your body weight to press your left / right heel down over the edge of the step. You should feel a stretch in your left / right calf. 5. Hold this position for __________ seconds. 6. Repeat this exercise with a slight bend in your left / right knee. Repeat __________ times with your left / right knee straight and __________ times with your left / right knee bent. Complete this exercise __________ times a day. Strengthening exercises These exercises build strength and endurance in your lower leg. Endurance is the ability to use your muscles for a long time, even after they get tired. Exercise B: Dorsiflexion   1. Secure a rubber exercise band or tubing to a fixed object, such as a table leg or a pole. 2. Secure the other end of the band around your left / right foot. 3. Sit on the floor, facing the fixed object. The band should be slightly tense when your foot is relaxed. 4. Slowly use your ankle muscles to pull your foot toward you. 5. Hold this position for  __________ seconds. 6. Slowly release the tension in the band and return your foot to the starting position. Repeat __________ times. Complete this exercise __________ times a day. Exercise C: Ankle eversion with band  1. Secure one end of a rubber exercise band or tubing to a fixed object, such as a table leg or a pole, that will stay in place when the band is pulled. 2. Loop the other end of the band around the middle of your left / right foot. 3. Sit on the floor, facing the fixed object. The band should be slightly tense when your foot is relaxed. 4. Make fists with your hands and put them between your knees. This will focus your strengthening at your ankle. 5. Leading with your little toe, slowly push your banded foot outward, away from your other leg. Make sure the band is positioned to resist the entire motion. 6. Hold this position for __________ seconds. 7. Control the tension in the band as you slowly return your foot to the starting position. Repeat __________ times. Complete this exercise __________ times a day. Exercise D: Ankle inversion with band  1. Secure one end of a rubber exercise band or tubing to a fixed object, such as a table leg or a pole, that will stay still when the band is pulled. 2. Loop the other end of the band around your left / right foot, just below your toes. 3. Sit on the  floor, facing the fixed object. The band should be slightly tense when your foot is relaxed. 4. Make fists with your hands and put them between your knees. This will focus your strengthening at your ankle. 5. Leading with your big toe, slowly pull your banded foot inward, toward your other leg. Make sure the band is positioned to resist the entire motion. 6. Hold this position for __________ seconds. 7. Control the tension in the band as you slowly return your foot to the starting position. Repeat __________ times. Complete this exercise __________ times a day. Exercise E: Lateral walking  with band  1. Stand in a long hallway. 2. Wrap a loop of exercise band around your legs, just above your knees. 3. Bend your knees gently and drop your hips down and back so your weight is over your heels. 4. Step to the side to move down the length of the hallway, keeping your toes pointed forward and keeping tension in the band. 5. Repeat, leading with your other leg. Repeat __________ times. Complete this exercise __________ times a day. Balance exercise This exercise will help improve your control of your foot and ankle when you are standing or walking. Exercise F: Single leg stand  1. Without wearing shoes, stand near a railing or in a doorway. You may hold onto the railing or door frame as needed. 2. Stand on your left / right foot. Keep your big toe down on the floor and try to keep your arch lifted. 3. If this exercise is too easy, you can try doing it with your eyes closed or while standing on a pillow. 4. Hold this position for __________ seconds. Repeat __________ times. Complete this exercise __________ times a day. This information is not intended to replace advice given to you by your health care provider. Make sure you discuss any questions you have with your health care provider. Document Released: 03/06/2005 Document Revised: 11/09/2015 Document Reviewed: 12/03/2014 Elsevier Interactive Patient Education  2017 [employer].

## 2016-07-31 NOTE — Progress Notes (Signed)
   PRENATAL VISIT NOTE  Subjective:  Jade Mathis is a 41 y.o. [phone] at [redacted]w[redacted]d being seen today for ongoing prenatal care.  She is currently monitored for the following issues for this high-risk pregnancy and has HYPOTHYROIDISM, POSTSURGICAL; Diabetes mellitus type 2 in obese ([place]); OBESITY, NOS; DEPRESSION, MAJOR, RECURRENT, MODERATE; Migraine; Myasthenia gravis ([place]); Asthma; GERD; OSA (obstructive sleep apnea); Pure hypercholesterolemia; Fibromyalgia; Stool incontinence; Ventral hernia; Preexisting diabetes complicating pregnancy, antepartum; Myasthenia gravis complicating pregnancy ([place]); Depression affecting pregnancy, antepartum; Hypothyroidism affecting pregnancy, antepartum; Advanced maternal age in multigravida; Supervision of high-risk pregnancy; Diabetes mellitus affecting pregnancy, second trimester; and Type 2 diabetes mellitus, uncontrolled ([place]) on her problem list.  Patient reports periumbilical pain since she became pregnant, but worse over 2 weeks. No increased pain when eating. History of umbilical hernia repair with mesh. States that mesh "came out". No rectal bleeding, melena. Additionally, having left anteriolateral leg pain. Worse with walking. Occasionally wakes her. No swelling, redness.  Contractions: Irritability. Vag. Bleeding: None.  Movement: Present. Denies leaking of fluid.   The following portions of the patient's history were reviewed and updated as appropriate: allergies, current medications, past family history, past medical history, past social history, past surgical history and problem list. Problem list updated.  Objective:   Vitals:   07/31/16 1517  BP: 114/77  Pulse: 93  Weight: 265 lb 8 oz (120.4 kg)    Fetal Status: Fetal Heart Rate (bpm): 140   Movement: Present     General:  Alert, oriented and cooperative. Patient is in no acute distress.  Skin: Skin is warm and dry. No rash noted.   Cardiovascular: Normal heart rate noted  Respiratory: Normal  respiratory effort, no problems with respiration noted  Abdomen: Soft, gravid, appropriate for gestational age. Pain/Pressure: Present     Pelvic:  Cervical exam deferred        Extremities: Normal range of motion.  Edema: Mild pitting, slight indentation  MSK: Tenderness to the anterior lower leg muscles. Worse with dorsiflexion. No swelling, erythema. Legs symmetric measurements: 42cm.  Mental Status: Normal mood and affect. Normal behavior. Normal judgment and thought content.   Assessment and Plan:  Pregnancy: [id number] at [redacted]w[redacted]d  1. Supervision of high risk pregnancy, antepartum FHT normal  2. Shin splints, left, initial encounter Ice, stretches given.  3. Umbilical hernia without obstruction and without gangrene No evidence of incarceration   Preterm labor symptoms and general obstetric precautions including but not limited to vaginal bleeding, contractions, leaking of fluid and fetal movement were reviewed in detail with the patient. Please refer to After Visit Summary for other counseling recommendations.  No Follow-up on file.   Truett Mainland, DO

## 2016-08-01 ENCOUNTER — Inpatient Hospital Stay (HOSPITAL_COMMUNITY)
Admission: AD | Admit: 2016-08-01 | Discharge: 2016-08-01 | Disposition: A | Discharge status: Home / Self Care | Payer: Medicare Other | Source: Ambulatory Visit | Attending: Obstetrics and Gynecology | Admitting: Obstetrics and Gynecology

## 2016-08-01 ENCOUNTER — Encounter (HOSPITAL_COMMUNITY): Payer: Self-pay

## 2016-08-01 ENCOUNTER — Ambulatory Visit (HOSPITAL_COMMUNITY)
Admission: RE | Admit: 2016-08-01 | Discharge: 2016-08-01 | Disposition: A | Discharge status: Home / Self Care | Payer: Medicare Other | Source: Ambulatory Visit | Attending: Obstetrics & Gynecology | Admitting: Obstetrics & Gynecology

## 2016-08-01 DIAGNOSIS — E1121 Type 2 diabetes mellitus with diabetic nephropathy: Secondary | ICD-10-CM

## 2016-08-01 DIAGNOSIS — B373 Candidiasis of vulva and vagina: Secondary | ICD-10-CM | POA: Insufficient documentation

## 2016-08-01 DIAGNOSIS — O26893 Other specified pregnancy related conditions, third trimester: Secondary | ICD-10-CM | POA: Insufficient documentation

## 2016-08-01 DIAGNOSIS — Z794 Long term (current) use of insulin: Secondary | ICD-10-CM | POA: Diagnosis not present

## 2016-08-01 DIAGNOSIS — Z885 Allergy status to narcotic agent status: Secondary | ICD-10-CM | POA: Diagnosis not present

## 2016-08-01 DIAGNOSIS — G7000 Myasthenia gravis without (acute) exacerbation: Secondary | ICD-10-CM | POA: Diagnosis not present

## 2016-08-01 DIAGNOSIS — O09522 Supervision of elderly multigravida, second trimester: Secondary | ICD-10-CM

## 2016-08-01 DIAGNOSIS — R109 Unspecified abdominal pain: Secondary | ICD-10-CM | POA: Diagnosis not present

## 2016-08-01 DIAGNOSIS — E119 Type 2 diabetes mellitus without complications: Secondary | ICD-10-CM | POA: Insufficient documentation

## 2016-08-01 DIAGNOSIS — F329 Major depressive disorder, single episode, unspecified: Secondary | ICD-10-CM

## 2016-08-01 DIAGNOSIS — O99350 Diseases of the nervous system complicating pregnancy, unspecified trimester: Secondary | ICD-10-CM

## 2016-08-01 DIAGNOSIS — O98813 Other maternal infectious and parasitic diseases complicating pregnancy, third trimester: Secondary | ICD-10-CM | POA: Insufficient documentation

## 2016-08-01 DIAGNOSIS — O24319 Unspecified pre-existing diabetes mellitus in pregnancy, unspecified trimester: Secondary | ICD-10-CM

## 2016-08-01 DIAGNOSIS — O99280 Endocrine, nutritional and metabolic diseases complicating pregnancy, unspecified trimester: Secondary | ICD-10-CM

## 2016-08-01 DIAGNOSIS — O99340 Other mental disorders complicating pregnancy, unspecified trimester: Secondary | ICD-10-CM

## 2016-08-01 DIAGNOSIS — O24113 Pre-existing diabetes mellitus, type 2, in pregnancy, third trimester: Secondary | ICD-10-CM | POA: Diagnosis not present

## 2016-08-01 DIAGNOSIS — O0992 Supervision of high risk pregnancy, unspecified, second trimester: Secondary | ICD-10-CM

## 2016-08-01 DIAGNOSIS — IMO0002 Reserved for concepts with insufficient information to code with codable children: Secondary | ICD-10-CM

## 2016-08-01 DIAGNOSIS — F32A Depression, unspecified: Secondary | ICD-10-CM

## 2016-08-01 DIAGNOSIS — Z3A30 30 weeks gestation of pregnancy: Secondary | ICD-10-CM | POA: Insufficient documentation

## 2016-08-01 DIAGNOSIS — E039 Hypothyroidism, unspecified: Secondary | ICD-10-CM

## 2016-08-01 DIAGNOSIS — O99353 Diseases of the nervous system complicating pregnancy, third trimester: Secondary | ICD-10-CM | POA: Insufficient documentation

## 2016-08-01 DIAGNOSIS — E1165 Type 2 diabetes mellitus with hyperglycemia: Secondary | ICD-10-CM

## 2016-08-01 DIAGNOSIS — B3731 Acute candidiasis of vulva and vagina: Secondary | ICD-10-CM

## 2016-08-01 LAB — WET PREP, GENITAL
Clue Cells Wet Prep HPF POC: NONE SEEN
Sperm: NONE SEEN
Trich, Wet Prep: NONE SEEN
Yeast Wet Prep HPF POC: NONE SEEN

## 2016-08-01 LAB — URINALYSIS, ROUTINE W REFLEX MICROSCOPIC
Bilirubin Urine: NEGATIVE
Glucose, UA: 50 mg/dL — AB
Hgb urine dipstick: NEGATIVE
Ketones, ur: 5 mg/dL — AB
Leukocytes, UA: NEGATIVE
Nitrite: NEGATIVE
Protein, ur: NEGATIVE mg/dL
Specific Gravity, Urine: 1.027 (ref 1.005–1.030)
pH: 5 (ref 5.0–8.0)

## 2016-08-01 LAB — GLUCOSE, CAPILLARY: Glucose-Capillary: 113 mg/dL — ABNORMAL HIGH (ref 65–99)

## 2016-08-01 MED ORDER — ONDANSETRON 8 MG PO TBDP
8.0000 mg | ORAL_TABLET | Freq: Once | ORAL | Status: AC
  Administered 2016-08-01: 8 mg via ORAL
  Filled 2016-08-01: qty 1

## 2016-08-01 MED ORDER — TERCONAZOLE 0.8 % VA CREA: 1.0000 | TOPICAL_CREAM | Freq: Every day | VAGINAL | 0 refills | Status: DC

## 2016-08-01 MED ORDER — CYCLOBENZAPRINE HCL 10 MG PO TABS
10.0000 mg | ORAL_TABLET | Freq: Once | ORAL | Status: AC
  Administered 2016-08-01: 10 mg via ORAL
  Filled 2016-08-01: qty 1
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
## 2016-08-01 NOTE — MAU Note (Signed)
Pt c/o abdominal pain that started yesterday. Pt states the pain got worse overnight and was really bad when she woke up this morning. Pt c/o a bad headache that started when she woke up this morning. Pt c/o brown vaginal discharge and burning since yesterday. Pt c/o decreased fetal movement yesterday and today.

## 2016-08-01 NOTE — Progress Notes (Signed)
Pt refuses to give medication history. Attempted by RN and pharmacy tech to obtain information. Pt states she is taking insulin and took some today. Pt will not elaborate further.

## 2016-08-01 NOTE — Progress Notes (Signed)
Pt sitting up, pt refuses to cooperate with fetal monitoring. Pt states she has too much pain to lie down. Pt offered extra pillows.

## 2016-08-01 NOTE — Discharge Instructions (Signed)
Back Pain in Pregnancy Back pain during pregnancy is common. Back pain may be caused by several factors that are related to changes during your pregnancy. Follow these instructions at home: Managing pain, stiffness, and swelling   If directed, apply ice for sudden (acute) back pain.  Put ice in a plastic bag.  Place a towel between your skin and the bag.  Leave the ice on for 20 minutes, 2-3 times per day.  If directed, apply heat to the affected area before you exercise:  Place a towel between your skin and the heat pack or heating pad.  Leave the heat on for 20-30 minutes.  Remove the heat if your skin turns bright red. This is especially important if you are unable to feel pain, heat, or cold. You may have a greater risk of getting burned. Activity   Exercise as told by your health care provider. Exercising is the best way to prevent or manage back pain.  Listen to your body when lifting. If lifting hurts, ask for help or bend your knees. This uses your leg muscles instead of your back muscles.  Squat down when picking up something from the floor. Do not bend over.  Only use bed rest as told by your health care provider. Bed rest should only be used for the most severe episodes of back pain. Standing, Sitting, and Lying Down   Do not stand in one place for long periods of time.  Use good posture when sitting. Make sure your head rests over your shoulders and is not hanging forward. Use a pillow on your lower back if necessary.  Try sleeping on your side, preferably the left side, with a pillow or two between your legs. If you are sore after a night's rest, your bed may be too soft. A firm mattress may provide more support for your back during pregnancy. General instructions   Do not wear high heels.  Eat a healthy diet. Try to gain weight within your health care provider's recommendations.  Use a maternity girdle, elastic sling, or back brace as told by your health care  provider.  Take over-the-counter and prescription medicines only as told by your health care provider.  Keep all follow-up visits as told by your health care provider. This is important. This includes any visits with any specialists, such as a physical therapist. Contact a health care provider if:  Your back pain interferes with your daily activities.  You have increasing pain in other parts of your body. Get help right away if:  You develop numbness, tingling, weakness, or problems with the use of your arms or legs.  You develop severe back pain that is not controlled with medicine.  You have a sudden change in bowel or bladder control.  You develop shortness of breath, dizziness, or you faint.  You develop nausea, vomiting, or sweating.  You have back pain that is a rhythmic, cramping pain similar to labor pains. Labor pain is usually 1-2 minutes apart, lasts for about 1 minute, and involves a bearing down feeling or pressure in your pelvis.  You have back pain and your water breaks or you have vaginal bleeding.  You have back pain or numbness that travels down your leg.  Your back pain developed after you fell.  You develop pain on one side of your back.  You see blood in your urine.  You develop skin blisters in the area of your back pain. This information is not intended to replace  advice given to you by your health care provider. Make sure you discuss any questions you have with your health care provider. Document Released: 06/14/2005 Document Revised: 08/12/2015 Document Reviewed: 11/18/2014 Elsevier Interactive Patient Education  2017 [place]. Round Ligament Pain The round ligament is a cord of muscle and tissue that helps to support the uterus. It can become a source of pain during pregnancy if it becomes stretched or twisted as the baby grows. The pain usually begins in the second trimester of pregnancy, and it can come and go until the baby is delivered. It is  not a serious problem, and it does not cause harm to the baby. Round ligament pain is usually a short, sharp, and pinching pain, but it can also be a dull, lingering, and aching pain. The pain is felt in the lower side of the abdomen or in the groin. It usually starts deep in the groin and moves up to the outside of the hip area. Pain can occur with:  A sudden change in position.  Rolling over in bed.  Coughing or sneezing.  Physical activity. Follow these instructions at home: Watch your condition for any changes. Take these steps to help with your pain:  When the pain starts, relax. Then try:  Sitting down.  Flexing your knees up to your abdomen.  Lying on your side with one pillow under your abdomen and another pillow between your legs.  Sitting in a warm bath for 15-20 minutes or until the pain goes away.  Take over-the-counter and prescription medicines only as told by your health care provider.  Move slowly when you sit and stand.  Avoid long walks if they cause pain.  Stop or lessen your physical activities if they cause pain. Contact a health care provider if:  Your pain does not go away with treatment.  You feel pain in your back that you did not have before.  Your medicine is not helping. Get help right away if:  You develop a fever or chills.  You develop uterine contractions.  You develop vaginal bleeding.  You develop nausea or vomiting.  You develop diarrhea.  You have pain when you urinate. This information is not intended to replace advice given to you by your health care provider. Make sure you discuss any questions you have with your health care provider. Document Released: 12/14/2007 Document Revised: 08/12/2015 Document Reviewed: 05/13/2014 Elsevier Interactive Patient Education  2017 [employer].

## 2016-08-03 ENCOUNTER — Other Ambulatory Visit (HOSPITAL_COMMUNITY): Payer: Self-pay

## 2016-08-03 ENCOUNTER — Ambulatory Visit (HOSPITAL_COMMUNITY)
Admission: RE | Admit: 2016-08-03 | Discharge: 2016-08-03 | Disposition: A | Discharge status: Home / Self Care | Payer: Medicare Other | Source: Ambulatory Visit | Attending: Obstetrics & Gynecology | Admitting: Obstetrics & Gynecology

## 2016-08-03 ENCOUNTER — Ambulatory Visit (INDEPENDENT_AMBULATORY_CARE_PROVIDER_SITE_OTHER): Payer: Medicare Other | Admitting: Family Medicine

## 2016-08-03 ENCOUNTER — Encounter (HOSPITAL_COMMUNITY): Payer: Self-pay

## 2016-08-03 VITALS — BP 127/78 | HR 97 | Wt 265.4 lb

## 2016-08-03 DIAGNOSIS — Z3A30 30 weeks gestation of pregnancy: Secondary | ICD-10-CM | POA: Diagnosis not present

## 2016-08-03 DIAGNOSIS — O09523 Supervision of elderly multigravida, third trimester: Secondary | ICD-10-CM | POA: Insufficient documentation

## 2016-08-03 DIAGNOSIS — O24113 Pre-existing diabetes mellitus, type 2, in pregnancy, third trimester: Secondary | ICD-10-CM | POA: Insufficient documentation

## 2016-08-03 DIAGNOSIS — E039 Hypothyroidism, unspecified: Secondary | ICD-10-CM | POA: Diagnosis not present

## 2016-08-03 DIAGNOSIS — O09522 Supervision of elderly multigravida, second trimester: Secondary | ICD-10-CM | POA: Diagnosis present

## 2016-08-03 DIAGNOSIS — O99280 Endocrine, nutritional and metabolic diseases complicating pregnancy, unspecified trimester: Secondary | ICD-10-CM | POA: Insufficient documentation

## 2016-08-03 DIAGNOSIS — O99213 Obesity complicating pregnancy, third trimester: Secondary | ICD-10-CM | POA: Diagnosis not present

## 2016-08-03 DIAGNOSIS — E119 Type 2 diabetes mellitus without complications: Secondary | ICD-10-CM | POA: Diagnosis not present

## 2016-08-03 DIAGNOSIS — O0993 Supervision of high risk pregnancy, unspecified, third trimester: Secondary | ICD-10-CM

## 2016-08-03 DIAGNOSIS — O24912 Unspecified diabetes mellitus in pregnancy, second trimester: Secondary | ICD-10-CM

## 2016-08-03 MED ORDER — INSULIN DETEMIR 100 UNIT/ML FLEXPEN: PEN_INJECTOR | SUBCUTANEOUS | 11 refills | Status: DC

## 2016-08-03 NOTE — Progress Notes (Signed)
Subjective:  Jade Mathis is a 41 y.o. [phone] at [redacted]w[redacted]d being seen today for ongoing prenatal care.  She is currently monitored for the following issues for this high-risk pregnancy and has HYPOTHYROIDISM, POSTSURGICAL; Diabetes mellitus type 2 in obese ([place]); OBESITY, NOS; DEPRESSION, MAJOR, RECURRENT, MODERATE; Migraine; Myasthenia gravis ([place]); Asthma; GERD; OSA (obstructive sleep apnea); Pure hypercholesterolemia; Fibromyalgia; Stool incontinence; Ventral hernia; Preexisting diabetes complicating pregnancy, antepartum; Myasthenia gravis complicating pregnancy ([place]); Depression affecting pregnancy, antepartum; Hypothyroidism affecting pregnancy, antepartum; Advanced maternal age in multigravida; Supervision of high-risk pregnancy; Diabetes mellitus affecting pregnancy, second trimester; and Type 2 diabetes mellitus, uncontrolled ([place]) on her problem list.  GDM: Patient taking Metformin XR 2000mg  at dinner, Levemir 34u in AM and 110u at bedtime, NPH 34u qhs and Humalog 22u at breakfast, 26u at lunch, and 28u with dinner. Reports 2-3 hypoglycemic episodes a week.  Tolerating medication well. Pt didn't bring log book. Fasting: mixture of elevated and normal. 2hr PP: elevated  Patient reports occasional contractions.  Contractions: Irritability. Vag. Bleeding: None.  Movement: Present. Denies leaking of fluid.   The following portions of the patient's history were reviewed and updated as appropriate: allergies, current medications, past family history, past medical history, past social history, past surgical history and problem list. Problem list updated.  Objective:   Vitals:   08/03/16 1302  BP: 127/78  Pulse: 97  Weight: 265 lb 6.4 oz (120.4 kg)    Fetal Status: Fetal Heart Rate (bpm): 132   Movement: Present     General:  Alert, oriented and cooperative. Patient is in no acute distress.  Skin: Skin is warm and dry. No rash noted.   Cardiovascular: Normal heart rate noted  Respiratory:  Normal respiratory effort, no problems with respiration noted  Abdomen: Soft, gravid, appropriate for gestational age. Pain/Pressure: Present     Pelvic: Vag. Bleeding: None Vag D/C Character: White   Cervical exam deferred        Extremities: Normal range of motion.  Edema: Trace  Mental Status: Normal mood and affect. Normal behavior. Normal judgment and thought content.   Urinalysis:      Assessment and Plan:  Pregnancy: [id number] at [redacted]w[redacted]d  1. Supervision of high risk pregnancy in third trimester FHT normal - Insulin Detemir (LEVEMIR) 100 UNIT/ML Pen; 34 units in the morning and 110 units at bedtime.  Dispense: 15 mL; Refill: 11  2. Diabetes mellitus affecting pregnancy, second trimester EFW: 1890g (4#3oz, 76%. AC 94%) BPP weekly. - Insulin Detemir (LEVEMIR) 100 UNIT/ML Pen; 34 units in the morning and 110 units at bedtime.  Dispense: 15 mL; Refill: 11  3. Hypothyroidism affecting pregnancy, antepartum Last checked 05/23/16 - 1.26 - Insulin Detemir (LEVEMIR) 100 UNIT/ML Pen; 34 units in the morning and 110 units at bedtime.  Dispense: 15 mL; Refill: 11  4. Elderly multigravida in third trimester BPP weekly. - Insulin Detemir (LEVEMIR) 100 UNIT/ML Pen; 34 units in the morning and 110 units at bedtime.  Dispense: 15 mL; Refill: 11  Preterm labor symptoms and general obstetric precautions including but not limited to vaginal bleeding, contractions, leaking of fluid and fetal movement were reviewed in detail with the patient. Please refer to After Visit Summary for other counseling recommendations.  No Follow-up on file.   Truett Mainland, DO

## 2016-08-04 ENCOUNTER — Telehealth: Payer: Self-pay | Admitting: Obstetrics and Gynecology

## 2016-08-04 NOTE — Telephone Encounter (Signed)
Call received from patient. She stated she was here for and u/s as well as an md appt. She stated that she was confused because the doctor told her she was measuring 34cm, so does that mean that she is actually 34 weeks and her EDD has changed? I spoke with her about her u/s in mfm agrees with her due date of 10/06/16. The fundal height measurement is subjective and serves as a guide to tell the doctor how the baby is growing. Patient stated she understood, and asked if that means her due date is still 10/06/16, and I affirmed this. Patient thanked me and had no further questions.

## 2016-08-04 NOTE — Telephone Encounter (Signed)
Patient called this morning needing to speak with a nurse about the visit she had on 08/03/16.

## 2016-08-06 ENCOUNTER — Encounter (HOSPITAL_COMMUNITY): Payer: Self-pay

## 2016-08-06 ENCOUNTER — Inpatient Hospital Stay (HOSPITAL_COMMUNITY)
Admission: AD | Admit: 2016-08-06 | Discharge: 2016-08-06 | Disposition: A | Discharge status: Home / Self Care | Payer: Medicare Other | Source: Ambulatory Visit | Attending: Obstetrics & Gynecology | Admitting: Obstetrics & Gynecology

## 2016-08-06 DIAGNOSIS — K219 Gastro-esophageal reflux disease without esophagitis: Secondary | ICD-10-CM | POA: Diagnosis not present

## 2016-08-06 DIAGNOSIS — Z79899 Other long term (current) drug therapy: Secondary | ICD-10-CM | POA: Diagnosis not present

## 2016-08-06 DIAGNOSIS — O368130 Decreased fetal movements, third trimester, not applicable or unspecified: Secondary | ICD-10-CM | POA: Insufficient documentation

## 2016-08-06 DIAGNOSIS — Z794 Long term (current) use of insulin: Secondary | ICD-10-CM | POA: Diagnosis not present

## 2016-08-06 DIAGNOSIS — O24113 Pre-existing diabetes mellitus, type 2, in pregnancy, third trimester: Secondary | ICD-10-CM | POA: Diagnosis not present

## 2016-08-06 DIAGNOSIS — Z885 Allergy status to narcotic agent status: Secondary | ICD-10-CM | POA: Diagnosis not present

## 2016-08-06 DIAGNOSIS — O368131 Decreased fetal movements, third trimester, fetus 1: Secondary | ICD-10-CM | POA: Diagnosis not present

## 2016-08-06 DIAGNOSIS — O99343 Other mental disorders complicating pregnancy, third trimester: Secondary | ICD-10-CM | POA: Insufficient documentation

## 2016-08-06 DIAGNOSIS — F319 Bipolar disorder, unspecified: Secondary | ICD-10-CM | POA: Insufficient documentation

## 2016-08-06 DIAGNOSIS — O99613 Diseases of the digestive system complicating pregnancy, third trimester: Secondary | ICD-10-CM | POA: Insufficient documentation

## 2016-08-06 DIAGNOSIS — J45909 Unspecified asthma, uncomplicated: Secondary | ICD-10-CM | POA: Insufficient documentation

## 2016-08-06 DIAGNOSIS — O99513 Diseases of the respiratory system complicating pregnancy, third trimester: Secondary | ICD-10-CM | POA: Insufficient documentation

## 2016-08-06 DIAGNOSIS — Z3A31 31 weeks gestation of pregnancy: Secondary | ICD-10-CM | POA: Insufficient documentation

## 2016-08-06 NOTE — MAU Note (Signed)
Pt reports not feeling movement x6 hours. States she has tried eating, drinking, moving stomach and nothing has worked. FHT in triage 150. Pt denies vaginal bleeding, vaginal discharge or LOF or pain.

## 2016-08-06 NOTE — Discharge Instructions (Signed)
Keep your appointment in the clinic on Wednesday. Continue the previous instructions given to you at your clinic appointments.

## 2016-08-06 NOTE — MAU Note (Signed)
Urine sent to lab 

## 2016-08-06 NOTE — Progress Notes (Addendum)
G6P3 @ 31.[redacted] wksga. Presents to triage for decrease FM. States ate and drank and layed down but still not active. Denies LOF or bleeding. VSS see flow sheet for details.   2040: Provider at bs assessing pt,   2047: monitors off per provider. Orders received to d/c pt home.   2050: Discharge orders given with pt understanding; pt left unit via ambulatory.
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
## 2016-08-07 ENCOUNTER — Ambulatory Visit (INDEPENDENT_AMBULATORY_CARE_PROVIDER_SITE_OTHER): Payer: Medicare Other | Admitting: Family Medicine

## 2016-08-07 ENCOUNTER — Telehealth: Payer: Self-pay | Admitting: General Practice

## 2016-08-07 VITALS — BP 121/70 | HR 102

## 2016-08-07 DIAGNOSIS — O368130 Decreased fetal movements, third trimester, not applicable or unspecified: Secondary | ICD-10-CM | POA: Diagnosis not present

## 2016-08-07 DIAGNOSIS — G4733 Obstructive sleep apnea (adult) (pediatric): Secondary | ICD-10-CM | POA: Diagnosis not present

## 2016-08-07 NOTE — Progress Notes (Signed)
Pt had MAU visit yesterday due to decreased fetal movement.  She called office earlier today to report continued decrease in fetal movement - no movement in >6 hrs.   Pt felt good FM during NST today - discussed kick counts and normal patterns of FM @ this EGA.  Pt voiced understanding.

## 2016-08-07 NOTE — Telephone Encounter (Signed)
Patient called stating she has felt the baby move minimally today. Patient has dried drinking something cold, eating something with sugar, jumping up and down, drinking coffee, but hasn't really felt the baby move today. Patient compares it to yesterday's episode but maybe moving slightly less. Patient is asking what to do because she really doesn't want to run back to MAU. Offered 1pm appt for work in NST. Patient verbalized understanding and states she will be here. Patient had no questions

## 2016-08-07 NOTE — Progress Notes (Signed)
NSt reactive

## 2016-08-09 ENCOUNTER — Ambulatory Visit (INDEPENDENT_AMBULATORY_CARE_PROVIDER_SITE_OTHER): Payer: Medicare Other | Admitting: Obstetrics and Gynecology

## 2016-08-09 ENCOUNTER — Telehealth: Payer: Self-pay | Admitting: Pulmonary Disease

## 2016-08-09 VITALS — BP 124/79 | HR 93 | Wt 264.2 lb

## 2016-08-09 DIAGNOSIS — O24913 Unspecified diabetes mellitus in pregnancy, third trimester: Secondary | ICD-10-CM

## 2016-08-09 DIAGNOSIS — O99210 Obesity complicating pregnancy, unspecified trimester: Secondary | ICD-10-CM

## 2016-08-09 DIAGNOSIS — O24912 Unspecified diabetes mellitus in pregnancy, second trimester: Secondary | ICD-10-CM

## 2016-08-09 DIAGNOSIS — O99213 Obesity complicating pregnancy, third trimester: Secondary | ICD-10-CM

## 2016-08-09 DIAGNOSIS — G4733 Obstructive sleep apnea (adult) (pediatric): Secondary | ICD-10-CM

## 2016-08-09 DIAGNOSIS — O0993 Supervision of high risk pregnancy, unspecified, third trimester: Secondary | ICD-10-CM

## 2016-08-09 DIAGNOSIS — O99283 Endocrine, nutritional and metabolic diseases complicating pregnancy, third trimester: Secondary | ICD-10-CM

## 2016-08-09 DIAGNOSIS — O99280 Endocrine, nutritional and metabolic diseases complicating pregnancy, unspecified trimester: Secondary | ICD-10-CM

## 2016-08-09 DIAGNOSIS — E039 Hypothyroidism, unspecified: Secondary | ICD-10-CM

## 2016-08-09 DIAGNOSIS — O09523 Supervision of elderly multigravida, third trimester: Secondary | ICD-10-CM

## 2016-08-09 LAB — POCT URINALYSIS DIP (DEVICE)
Bilirubin Urine: NEGATIVE
Glucose, UA: NEGATIVE mg/dL
Hgb urine dipstick: NEGATIVE
Ketones, ur: NEGATIVE mg/dL
Leukocytes, UA: NEGATIVE
Nitrite: NEGATIVE
Protein, ur: NEGATIVE mg/dL
Specific Gravity, Urine: 1.02 (ref 1.005–1.030)
Urobilinogen, UA: 2 mg/dL — ABNORMAL HIGH (ref 0.0–1.0)
pH: 6 (ref 5.0–8.0)

## 2016-08-09 MED ORDER — INSULIN ISOPHANE HUMAN 100 UNIT/ML KWIKPEN: PEN_INJECTOR | SUBCUTANEOUS | 2 refills | Status: DC

## 2016-08-09 NOTE — Telephone Encounter (Signed)
Pt had sleep study a few nights and is requesting the results.  Aware that as soon as we receive these and Dr Halford Chessman reviews them we will call her. Pt aware that the results can take 1-2 weeks to get back/resulted depending on the amount of studies needing to be read.  Please advise Dr Halford Chessman. Thanks.

## 2016-08-09 NOTE — Progress Notes (Signed)
Prenatal Visit Note Date: 08/09/2016 Clinic: [facility]  Subjective:  Jade Mathis is a 41 y.o. [id number] at [redacted]w[redacted]d being seen today for ongoing prenatal care.  She is currently monitored for the following issues for this high-risk pregnancy and has HYPOTHYROIDISM, POSTSURGICAL; Diabetes mellitus type 2 in obese ([employer]); Obesity in pregnancy; DEPRESSION, MAJOR, RECURRENT, MODERATE; Migraine; Myasthenia gravis ([place]); Asthma; GERD; OSA (obstructive sleep apnea); Pure hypercholesterolemia; Fibromyalgia; Stool incontinence; Ventral hernia; Preexisting diabetes complicating pregnancy, antepartum; Myasthenia gravis complicating pregnancy ([place]); Depression affecting pregnancy, antepartum; Hypothyroidism affecting pregnancy, antepartum; Advanced maternal age in multigravida; Supervision of high-risk pregnancy; Diabetes mellitus affecting pregnancy, second trimester; and Type 2 diabetes mellitus, uncontrolled ([place]) on her problem list.  Patient reports feeling tired, vaginal pressure occasionally Contractions: Irritability. Vag. Bleeding: None.  Movement: Present. Denies leaking of fluid.   The following portions of the patient's history were reviewed and updated as appropriate: allergies, current medications, past family history, past medical history, past social history, past surgical history and problem list. Problem list updated.  Objective:   Vitals:   08/09/16 0748  BP: 124/79  Pulse: 93  Weight: 264 lb 3.2 oz (119.8 kg)    Fetal Status: Fetal Heart Rate (bpm): 137   Movement: Present     General:  Alert, oriented and cooperative. Patient is in no acute distress.  Skin: Skin is warm and dry. No rash noted.   Cardiovascular: Normal heart rate noted  Respiratory: Normal respiratory effort, no problems with respiration noted  Abdomen: Soft, gravid, appropriate for gestational age. Pain/Pressure: Present     Pelvic:  Cervical exam deferred        Extremities: Normal range of  motion.  Edema: Trace  Mental Status: Normal mood and affect. Normal behavior. Normal judgment and thought content.   Urinalysis:      Assessment and Plan:  Pregnancy: [id number] at [redacted]w[redacted]d  1. Diabetes mellitus affecting pregnancy, second trimester Levemir 44 qam humulog 22/26/28 levemir 110 qhs NPH 34 qhs Metformin 2000 with dinner  89//113/153 132//177 97//120 83//144/108 149//201//118 141//170//165 141//99  Fast[place] are increasing so NPH increased to 40 qhs RTC 7-10d to review log book Follow up BPP for tomorrow. Start 2x/week testing starting next week.  Rpt growth already scheduled for mid June  2. Hypothyroidism affecting pregnancy, antepartum Continue synthroid  3. Elderly multigravida in third trimester No change in plan of care  4. Obesity in pregnancy No change in plan of care. Pt states she had sleep study a few days ago and is awaiting the results  5. Supervision of high risk pregnancy in third trimester Routine care. IUD  Preterm labor symptoms and general obstetric precautions including but not limited to vaginal bleeding, contractions, leaking of fluid and fetal movement were reviewed in detail with the patient. Please refer to After Visit Summary for other counseling recommendations.  Return in about 1 week (around 08/16/2016) for start 2x/week testing next tuesday. Aletha Halim, MD

## 2016-08-10 ENCOUNTER — Other Ambulatory Visit (HOSPITAL_COMMUNITY): Payer: Self-pay

## 2016-08-10 ENCOUNTER — Ambulatory Visit (HOSPITAL_COMMUNITY)
Admission: RE | Admit: 2016-08-10 | Discharge: 2016-08-10 | Disposition: A | Discharge status: Home / Self Care | Payer: Medicare Other | Source: Ambulatory Visit | Attending: Obstetrics & Gynecology | Admitting: Obstetrics & Gynecology

## 2016-08-10 ENCOUNTER — Encounter (HOSPITAL_COMMUNITY): Payer: Self-pay

## 2016-08-10 DIAGNOSIS — O99283 Endocrine, nutritional and metabolic diseases complicating pregnancy, third trimester: Secondary | ICD-10-CM | POA: Diagnosis not present

## 2016-08-10 DIAGNOSIS — O09523 Supervision of elderly multigravida, third trimester: Secondary | ICD-10-CM | POA: Diagnosis not present

## 2016-08-10 DIAGNOSIS — Z3A31 31 weeks gestation of pregnancy: Secondary | ICD-10-CM | POA: Diagnosis not present

## 2016-08-10 DIAGNOSIS — O99213 Obesity complicating pregnancy, third trimester: Secondary | ICD-10-CM | POA: Insufficient documentation

## 2016-08-10 DIAGNOSIS — E039 Hypothyroidism, unspecified: Secondary | ICD-10-CM | POA: Insufficient documentation

## 2016-08-10 DIAGNOSIS — O24113 Pre-existing diabetes mellitus, type 2, in pregnancy, third trimester: Secondary | ICD-10-CM | POA: Insufficient documentation

## 2016-08-10 DIAGNOSIS — Z3A30 30 weeks gestation of pregnancy: Secondary | ICD-10-CM

## 2016-08-10 NOTE — Telephone Encounter (Signed)
HST 08/07/16 >> AHI 9.1, SaO2 low 70%   Will have my nurse inform pt that sleep study shows mild sleep apnea.  Options are 1) CPAP now, 2) ROV first.  If She is agreeable to CPAP, then please send order for auto CPAP range 5 to 15 cm H2O with heated humidity and mask of choice.  Have download sent 1 month after starting CPAP and set up ROV 2 months after starting CPAP.  ROV can be with me or NP.

## 2016-08-11 ENCOUNTER — Other Ambulatory Visit (HOSPITAL_COMMUNITY): Payer: Self-pay | Admitting: *Deleted

## 2016-08-11 DIAGNOSIS — G4733 Obstructive sleep apnea (adult) (pediatric): Secondary | ICD-10-CM | POA: Diagnosis not present

## 2016-08-11 DIAGNOSIS — O24119 Pre-existing diabetes mellitus, type 2, in pregnancy, unspecified trimester: Secondary | ICD-10-CM

## 2016-08-11 NOTE — Telephone Encounter (Signed)
Called and spoke with pt and she is aware of results and would like to be set up with cpap.  Order has been placed and the pt is aware that she will need to come in about 2 months after being on the cpap for appt.

## 2016-08-15 ENCOUNTER — Other Ambulatory Visit (HOSPITAL_COMMUNITY)
Admission: RE | Admit: 2016-08-15 | Discharge: 2016-08-15 | Disposition: A | Discharge status: Home / Self Care | Payer: Medicare Other | Source: Ambulatory Visit | Attending: Advanced Practice Midwife | Admitting: Advanced Practice Midwife

## 2016-08-15 ENCOUNTER — Ambulatory Visit (INDEPENDENT_AMBULATORY_CARE_PROVIDER_SITE_OTHER): Payer: Medicare Other | Admitting: Advanced Practice Midwife

## 2016-08-15 VITALS — BP 122/67 | HR 95 | Wt 266.6 lb

## 2016-08-15 DIAGNOSIS — K219 Gastro-esophageal reflux disease without esophagitis: Secondary | ICD-10-CM | POA: Insufficient documentation

## 2016-08-15 DIAGNOSIS — O99213 Obesity complicating pregnancy, third trimester: Secondary | ICD-10-CM | POA: Diagnosis not present

## 2016-08-15 DIAGNOSIS — O0993 Supervision of high risk pregnancy, unspecified, third trimester: Secondary | ICD-10-CM

## 2016-08-15 DIAGNOSIS — O24319 Unspecified pre-existing diabetes mellitus in pregnancy, unspecified trimester: Secondary | ICD-10-CM

## 2016-08-15 DIAGNOSIS — O09523 Supervision of elderly multigravida, third trimester: Secondary | ICD-10-CM

## 2016-08-15 DIAGNOSIS — F329 Major depressive disorder, single episode, unspecified: Secondary | ICD-10-CM | POA: Insufficient documentation

## 2016-08-15 DIAGNOSIS — Z3A32 32 weeks gestation of pregnancy: Secondary | ICD-10-CM | POA: Insufficient documentation

## 2016-08-15 DIAGNOSIS — O99343 Other mental disorders complicating pregnancy, third trimester: Secondary | ICD-10-CM | POA: Diagnosis not present

## 2016-08-15 DIAGNOSIS — O99613 Diseases of the digestive system complicating pregnancy, third trimester: Secondary | ICD-10-CM | POA: Diagnosis not present

## 2016-08-15 MED ORDER — INSULIN ISOPHANE HUMAN 100 UNIT/ML KWIKPEN: PEN_INJECTOR | SUBCUTANEOUS | 2 refills | Status: DC

## 2016-08-15 MED ORDER — HUMALOG KWIKPEN 100 UNIT/ML ~~LOC~~ SOPN: PEN_INJECTOR | SUBCUTANEOUS | 5 refills | Status: DC

## 2016-08-15 NOTE — Progress Notes (Signed)
Pt reports pain in her back and vagina. Pt has weekly BPP @ MFM. Pt was offered opportunity to see [employer] and she declined. 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

## 2016-08-15 NOTE — Progress Notes (Signed)
   PRENATAL VISIT NOTE  Subjective:  Jade Mathis is a 41 y.o. [phone] at [redacted]w[redacted]d being seen today for ongoing prenatal care.  She is currently monitored for the following issues for this high-risk pregnancy and has HYPOTHYROIDISM, POSTSURGICAL; Diabetes mellitus type 2 in obese ([place]); Obesity in pregnancy; DEPRESSION, MAJOR, RECURRENT, MODERATE; Migraine; Myasthenia gravis ([place]); Asthma; GERD; OSA (obstructive sleep apnea); Pure hypercholesterolemia; Fibromyalgia; Stool incontinence; Ventral hernia; Preexisting diabetes complicating pregnancy, antepartum; Myasthenia gravis complicating pregnancy ([place]); Depression affecting pregnancy, antepartum; Hypothyroidism affecting pregnancy, antepartum; Advanced maternal age in multigravida; Supervision of high-risk pregnancy; Diabetes mellitus affecting pregnancy, second trimester; and Type 2 diabetes mellitus, uncontrolled ([place]) on her problem list.  Patient reports occasional contractions and vagianl painand irritation.  Contractions: Irregular. Vag. Bleeding: None.  Movement: Present. Denies leaking of fluid.   The following portions of the patient's history were reviewed and updated as appropriate: allergies, current medications, past family history, past medical history, past social history, past surgical history and problem list. Problem list updated.  Objective:   Vitals:   08/15/16 0904  BP: 122/67  Pulse: 95  Weight: 266 lb 9.6 oz (120.9 kg)    Fetal Status: Fetal Heart Rate (bpm): NST Fundal Height: 34 cm Movement: Present     General:  Alert, oriented and cooperative. Patient is in no acute distress.  Skin: Skin is warm and dry. No rash noted.   Cardiovascular: Normal heart rate noted  Respiratory: Normal respiratory effort, no problems with respiration noted  Abdomen: Soft, gravid, appropriate for gestational age. Pain/Pressure: Present     Pelvic:  Cervical exam performed Dilation: Closed Effacement (%): 0 Station: Ballotable. Physiologic  discharge. No LOF, VB, swelling, erythema or lesions.  Extremities: Normal range of motion.  Edema: Trace  Mental Status: Normal mood and affect. Normal behavior. Normal judgment and thought content.   Fasting CBGs: 80-110's (80% out of range) PCB:  90-120's (20% out of range) PCL:  (100% out of range) PCD:  160-180 (100% out of range)   Assessment and Plan:  Pregnancy: [id number] at [redacted]w[redacted]d  1. Elderly multigravida in third trimester   2. Preexisting diabetes complicating pregnancy, antepartum. Uncontrolled.  - Discussed w/ Dr. Nehemiah Settle. Increase Humalog to 30 Units PC lunch and dinner. And increase NPH to 45 Units QHS.  - Weekly BPP.   3. Supervision of high risk pregnancy in third trimester  4. Vaginal pain- Probably Nml vulvar engorgement or pregnancy.  - Cervicovaginal ancillary only  Preterm labor symptoms and general obstetric precautions including but not limited to vaginal bleeding, contractions, leaking of fluid and fetal movement were reviewed in detail with the patient. Please refer to After Visit Summary for other counseling recommendations.  F/U ~1 week w/ MD to check CBGs Return in about 1 week (around 08/22/2016) for Change appt to Dr. Roselie Awkward; 6/21 Ob fu and NST - has [place] @ 0830, 6/28 Ob fu and NST - has [place] @ 0830.   Manya Silvas, CNM

## 2016-08-15 NOTE — Patient Instructions (Signed)
Increase Lunch Humalog to 30 Units Increase Dinner Humalog to 30 Units Increase NPH to 45 Units   Braxton Hicks Contractions Contractions of the uterus can occur throughout pregnancy, but they are not always a sign that you are in labor. You may have practice contractions called Braxton Hicks contractions. These false labor contractions are sometimes confused with true labor. What are [place] contractions? Braxton Hicks contractions are tightening movements that occur in the muscles of the uterus before labor. Unlike true labor contractions, these contractions do not result in opening (dilation) and thinning of the cervix. Toward the end of pregnancy (32-34 weeks), Braxton Hicks contractions can happen more often and may become stronger. These contractions are sometimes difficult to tell apart from true labor because they can be very uncomfortable. You should not feel embarrassed if you go to the hospital with false labor. Sometimes, the only way to tell if you are in true labor is for your health care provider to look for changes in the cervix. The health care provider will do a physical exam and may monitor your contractions. If you are not in true labor, the exam should show that your cervix is not dilating and your water has not broken. If there are no prenatal problems or other health problems associated with your pregnancy, it is completely safe for you to be sent home with false labor. You may continue to have Braxton Hicks contractions until you go into true labor. How can I tell the difference between true labor and false labor?  Differences  False labor  Contractions last 30-70 seconds.: Contractions are usually shorter and not as strong as true labor contractions.  Contractions become very regular.: Contractions are usually irregular.  Discomfort is usually felt in the top of the uterus, and it spreads to the lower abdomen and low back.: Contractions are often felt in the front  of the lower abdomen and in the groin.  Contractions do not go away with walking.: Contractions may go away when you walk around or change positions while lying down.  Contractions usually become more intense and increase in frequency.: Contractions get weaker and are shorter-lasting as time goes on.  The cervix dilates and gets thinner.: The cervix usually does not dilate or become thin. Follow these instructions at home:  Take over-the-counter and prescription medicines only as told by your health care provider.  Keep up with your usual exercises and follow other instructions from your health care provider.  Eat and drink lightly if you think you are going into labor.  If Braxton Hicks contractions are making you uncomfortable:  Change your position from lying down or resting to walking, or change from walking to resting.  Sit and rest in a tub of warm water.  Drink enough fluid to keep your urine clear or pale yellow. Dehydration may cause these contractions.  Do slow and deep breathing several times an hour.  Keep all follow-up prenatal visits as told by your health care provider. This is important. Contact a health care provider if:  You have a fever.  You have continuous pain in your abdomen. Get help right away if:  Your contractions become stronger, more regular, and closer together.  You have fluid leaking or gushing from your vagina.  You pass blood-tinged mucus (bloody show).  You have bleeding from your vagina.  You have low back pain that you never had before.  You feel your baby's head pushing down and causing pelvic pressure.  Your baby  is not moving inside you as much as it used to. Summary  Contractions that occur before labor are called Braxton Hicks contractions, false labor, or practice contractions.  Braxton Hicks contractions are usually shorter, weaker, farther apart, and less regular than true labor contractions. True labor contractions  usually become progressively stronger and regular and they become more frequent.  Manage discomfort from [facility] contractions by changing position, resting in a warm bath, drinking plenty of water, or practicing deep breathing. This information is not intended to replace advice given to you by your health care provider. Make sure you discuss any questions you have with your health care provider. Document Released: 03/06/2005 Document Revised: 01/24/2016 Document Reviewed: 01/24/2016 Elsevier Interactive Patient Education  2017 [employer].

## 2016-08-16 ENCOUNTER — Ambulatory Visit (INDEPENDENT_AMBULATORY_CARE_PROVIDER_SITE_OTHER): Payer: Medicare Other | Admitting: Neurology

## 2016-08-16 ENCOUNTER — Encounter: Payer: Self-pay | Admitting: General Practice

## 2016-08-16 ENCOUNTER — Other Ambulatory Visit: Payer: Self-pay | Admitting: *Deleted

## 2016-08-16 ENCOUNTER — Encounter: Payer: Self-pay | Admitting: Neurology

## 2016-08-16 VITALS — BP 110/70 | HR 92 | Ht 66.5 in | Wt 267.2 lb

## 2016-08-16 DIAGNOSIS — G4733 Obstructive sleep apnea (adult) (pediatric): Secondary | ICD-10-CM

## 2016-08-16 DIAGNOSIS — G7000 Myasthenia gravis without (acute) exacerbation: Secondary | ICD-10-CM | POA: Diagnosis not present

## 2016-08-16 LAB — POCT URINALYSIS DIP (DEVICE)
Bilirubin Urine: NEGATIVE
Glucose, UA: NEGATIVE mg/dL
Hgb urine dipstick: NEGATIVE
Ketones, ur: NEGATIVE mg/dL
Leukocytes, UA: NEGATIVE
Nitrite: NEGATIVE
Protein, ur: NEGATIVE mg/dL
Specific Gravity, Urine: <=1.005 (ref 1.005–1.030)
Urobilinogen, UA: 0.2 mg/dL (ref 0.0–1.0)
pH: 6 (ref 5.0–8.0)

## 2016-08-16 LAB — CERVICOVAGINAL ANCILLARY ONLY
Bacterial vaginitis: NEGATIVE
Candida vaginitis: NEGATIVE
Chlamydia: NEGATIVE
Neisseria Gonorrhea: NEGATIVE
Trichomonas: NEGATIVE

## 2016-08-16 NOTE — Progress Notes (Signed)
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   History of present illness: She was diagnosed with myasthenia gravis in 07/11/1995 with positive RNS and serology testing which manifested with weakness, double vision, ptosis, dysphagia and dysarthria. The same year, she underwent thymectomy with post-operative period complicated by respiratory failure requiring ICU stay and plasmapharesis. Around the same time, prednisone and mestinon was initiated. At some point, she was started on azathioprine. Azathioprine was stopped in July 11, 2002 because of an unplanned pregnancy and she remained on prednisone 20mg  until 07-10-2009 when she had an exacerbation requiring out-patient PLEX. In 07/10/09, Imuran was restarted (dose unknown). She been on prednisone since 07/11/1995 and the lowest dose is her current dose at 10mg  daily. Cellcept was tried but she developed a rash so it was stopped.   The patient has previously been evaluated at [facility] by Dr. Truman Hayward July 11, 2010), discharged from [facility], and then established care with Dr. Jacelyn Grip at [facility] in April 2013. From 12/2011 - 07/2012, she was evaluated by Dr. Jannifer Franklin at [place] in [facility]. When she initially saw Dr. Jacelyn Grip in April 2013, she was only on prednisone 10mg  and mestinon 60mg  QID and demonstrated clinical signs of weakness, therefore Imuran was restarted.  There was also a huge overlay of depression which started after her father's death in 07-11-2011 when he died within a month of being diagnosed with lung cancer diagnosis. She sees psychiatry and behavorial therapist that is coming to her  home.  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    In 07-11-2014, she was doing well and able to reduce her prednisone to 20mg  every other day.  In June 2016, she was hospitalized for probable MG exacerbation associated with GI illness and completed 3 treatments of IVIG.  Patient continued to have persistent dysarthria so an additional 2-days of IVIG was completed at home in early July.   In 07-11-15, she was doing great at her follow-up visit in May and down to prednisone 10mg  daily.  However, within a week she returned to the clinic with delusional thoughts, confusion, and depression.   She presented here on 08/26/2015 without a scheduled appointment demanding to have her blood checked for "poison".  She was very delusional, claiming her husband was having an affair and the woman was doing witchcraft - see telephone note for details. She also had unwanted pregnancy and had a miscarriage on 9/13.  She was also transfused for anemia due to acute blood loss. Since her D&C, she has not been feeling very tired. She occasionally gets choked on her food, but she denies any new double vision, ptosis, jaw fatigue, or shortness of breath.  Her azathioprine was stopped when she found out she was pregnancy and she, herself, restarted it last week after her D&C.    She is seeing [facility] [place].    UPDATE 04/14/2016: She is here for her follow-up visit.  Since she was last here, she has become [redacted] weeks pregnant and appropriately discontinued Imuran.  She takes prednisone 10mg  daily and mestinon 60mg  TID and has been doing well from MG standpoint.  She denies any double vision,  difficulty swallowing/talking, or limb weakness.  She is very tired today because she has difficulty sleeping comfortably and also is dealing with morning sickness.  No new neurological complaints.    UPDATE 06/06/2016:  Patient was hospitalized last week with abodminal pain and cramping, and is doing  better now.  Last week, she received a call for an attorney about some personal matters and she became very anxious, depressed, crying all day, and felt like her swallowing was getting more difficult.  She called to schedule a return visit because she felt that her MG was getting worse.  Today, she is looking great and denies any problems.  She is aware that her symptoms last week were due to stress.  She brings a letter from two of her therapists outlining her PTSD and how it affected her in 2016.  Patient requesting to have this scanned into her medical chart.    She is now 6 months pregnant with her daughter.  UPDATE 08/16/2016:  She is here for routine follow-up appointment and appears very depressed and tearful at times. She expresses being tired, fatigued, and stressed all the time.  She denies any new MG-symptoms, but feels like it is getting worse.  In the past, she has often mistaken true myasthenia exacerbation from stressed-induced fatigue and depression.  She admits to being very stressed because she wants to be induced early as the pregnancy is making her extremely tired.  She also expresses a lot of frustration and stress related to her attorney.  She has not been seeing her therapist regularly.  She denies any double vision, difficulty swallowing/talking, or limb weakness.  Medications:  Current Outpatient Prescriptions on File Prior to Visit  Medication Sig Dispense Refill  . ACCU-CHEK SOFTCLIX LANCETS lancets 1 each by Other route 4 (four) times daily. Use as instructed 100 each 7  . albuterol (PROVENTIL) (2.5 MG/3ML) 0.083% nebulizer solution Take 3 mLs (2.5 mg total) by nebulization every 4 (four) hours as needed for wheezing or shortness of breath. 75 mL 6  . butalbital-acetaminophen-caffeine (FIORICET, ESGIC) 50-325-40 MG tablet Take 1-2 tablets by mouth 2 (two) times daily as needed for headache.    . cetirizine (ZYRTEC) 10 MG tablet Take 1 tablet (10 mg total) by mouth daily. 30  tablet 11  . Continuous Blood Gluc Receiver (FREESTYLE LIBRE READER) DEVI 1 Device by Does not apply route as directed. [place]- [id number] Dx code -O24.912 1 Device 1  . Doxylamine-Pyridoxine (DICLEGIS) 10-10 MG TBEC Take 2 tablets by mouth at bedtime. If symptoms persist, add one tablet in the morning and one in the afternoon 100 tablet 5  . feeding supplement, GLUCERNA SHAKE, (GLUCERNA SHAKE) LIQD Take 237 mLs by mouth 3 (three) times daily between meals. 90 Can 11  . fluticasone (FLOVENT HFA) 110 MCG/ACT inhaler Inhale 1 puff into the lungs 2 (two) times daily. 1 Inhaler 12  . glucose blood (ACCU-CHEK GUIDE) test strip Use to check blood sugar 3 times per day. Dx code E11.9 200 each 2  . HUMALOG KWIKPEN 100 UNIT/ML KiwkPen 22 units AC Breakfast/30 AC Lunch/30 AC Dinner 30 mL 5  . Insulin Detemir (LEVEMIR) 100 UNIT/ML Pen 40 units in the morning and 110 units at bedtime. (Patient not taking: Reported on 08/15/2016) 15 mL 11  . Insulin NPH, Human,, Isophane, (HUMULIN N KWIKPEN) 100 UNIT/ML Kiwkpen 45 units SQ at bedtime 15 mL 2  . Insulin Syringes, Disposable, U-100 1 ML MISC 1 Device by Does not apply route as needed.  60 each 12  . levothyroxine (SYNTHROID, LEVOTHROID) 175 MCG tablet Take 1 tablet (175 mcg total) by mouth daily. 90 tablet 1  . metFORMIN (GLUCOPHAGE-XR) 500 MG 24 hr tablet TAKE 4 TABLETS BY MOUTH EVERY DAY WITH SUPPER 120 tablet 3  . oxyCODONE (ROXICODONE) 15 MG immediate release tablet Take 15 mg by mouth 4 (four) times daily as needed for pain.    . pantoprazole (PROTONIX) 40 MG tablet Take 1 tablet (40 mg total) by mouth daily. 30 tablet 5  . predniSONE (DELTASONE) 10 MG tablet Take 10 mg by mouth daily with breakfast.     . Prenatal Vit-Fe Fum-FA-Omega (PRENATAL MULTI +DHA) 27-0.8-228 MG CAPS Take 1 capsule by mouth daily. 30 capsule 10  . pyridostigmine (MESTINON) 60 MG tablet Take 1 tablet (60 mg total) by mouth 3 (three) times daily. 270 tablet 3  . terconazole (TERAZOL 3)  0.8 % vaginal cream Place 1 applicator vaginally at bedtime. (Patient not taking: Reported on 08/15/2016) 20 g 0  . valACYclovir (VALTREX) 500 MG tablet Take 500 mg by mouth daily.     Current Facility-Administered Medications on File Prior to Visit  Medication Dose Route Frequency Provider Last Rate Last Dose  . buPROPion (WELLBUTRIN XL) 24 hr tablet 300 mg  300 mg Oral Daily Woodroe Mode, MD        Allergies:  Allergies  Allergen Reactions  . Depo-Provera [Medroxyprogesterone] Other (See Comments)    Reaction:  Headaches   . Vicodin [Hydrocodone-Acetaminophen] Nausea Only    Review of Systems:  CONSTITUTIONAL: No fevers, chills, night sweats EYES: No visual changes or eye pain ENT: No hearing changes.  No history of nose bleeds.   RESPIRATORY: No cough, wheezing, shortness of breath.   CARDIOVASCULAR: No chest pain, and palpitations.   GI: Negative for abdominal discomfort, blood in stools or black stools.      GU:  No history of incontinence.   MUSCLOSKELETAL: + joint pain or swelling.  No myalgias.   SKIN: Negative for lesions, rash, and itching.   HEMATOLOGY/ONCOLOGY: Negative for prolonged bleeding, bruising easily, and swollen nodes.  No history of cancer.   ENDOCRINE: Negative for cold or heat intolerance, polydipsia or goiter.   PSYCH:  + depression or anxiety symptoms.   NEURO: As Above.   Vital Signs:  BP 110/70   Pulse 92   Ht 5' 6.5" (1.689 m)   Wt 267 lb 4 oz (121.2 kg)   LMP 12/31/2015 (Exact Date)   SpO2 97%   BMI 42.49 kg/m   Neurological Exam: MENTAL STATUS:  Awake, oriented, very depressed and tearful at times.  She does not engage in conversation.  CRANIAL NERVES: Pupils are round and reactive to light. Extraocular movements are intact. Face is symmetric.  There is no ptosis.  No facial weakness.  MOTOR:  Motor strength is 5/5 with repeated testing, there is some give-way weakness. Mild weakness of the RLE (old).  GAIT:  She is walking assisted  with cane, slow slightly antalgic  Lab Results  Component Value Date   HGBA1C 6.3 06/20/2016   Lab Results  Component Value Date   TSH 1.26 05/23/2016     IMPRESSION: 1.  Seropositive MG (diagnosed 1997) s/p thymectomy, clinically stable off Imuran due to pregnancy.    - Previously hospitalized for MG exacerbation in 1997 (PLEX), 2011 (PLEX), 2016 (IVIG)  - Continue prednisone 10mg  daily, and mestinon 60mg  TID   - She will stay off Imuran for the duration of  her pregnancy  2.  Pregnancy precautions in MG patient discussed  - She had increased risk of preterm labor and relapse post-partum  - Vaginal delivery is preferred in myasthenia gravis, unless there is an obstetrical reason for cesearian delivery, but need to monitor for fatigue during labor.  Avoid magnesium to due exacerbating muscle weakness.  - Post-partum, if patient chooses to breastfeed, she will need to stay off azathioprine.  OK to continue prednisone and breastfeed four hours following medication administration.  No changes to mestinon are recommended with breastfeeding.  3.  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  Chronic daily headaches, followed by Dr. Joretta Bachelor at [facility] in [place]  5.  PTSD and depression, followed by psychiatry and seeing behavior therapy.  Strongly advised her follow-up regularly with her counselor especially as she approaches her due date because it is so important to keep her psychologically and physically optimized during this time.    Return to clinic in 4 months   The duration of this appointment visit was 30 minutes of face-to-face time with the patient.  Greater than 50% of this time was spent in counseling, explanation of diagnosis, planning of further management, and coordination of care.   Thank you for allowing me to participate in patient's care.  If I can answer any additional questions, I would be pleased  to do so.    Sincerely,    Isabele Lollar K. Posey Pronto, DO

## 2016-08-16 NOTE — Patient Instructions (Addendum)
1.  You are very sad and depressed today which is making you feel tired, not your myasthenia.  Therefore, it is very important that you call your therapist and see them frequently during this time.    2.  Continue your prednisone 10mg  daily and mestinon 60mg  three times daily  3.  Vaginal delivery is preferred in myasthenia gravis, unless there is an obstretcical reason for cesearian delivery  4.  If you breastfeed, do not take azathioprine.       Breastfeed four hours, AFTER taking prednisone      OK to continue to take mestinon 60mg  three times daily while breast feeding  Return to clinic in 4 months

## 2016-08-17 ENCOUNTER — Telehealth (HOSPITAL_COMMUNITY): Payer: Self-pay | Admitting: *Deleted

## 2016-08-17 ENCOUNTER — Ambulatory Visit (HOSPITAL_COMMUNITY)
Admission: RE | Admit: 2016-08-17 | Discharge: 2016-08-17 | Disposition: A | Discharge status: Home / Self Care | Payer: Medicare Other | Source: Ambulatory Visit | Attending: Obstetrics & Gynecology | Admitting: Obstetrics & Gynecology

## 2016-08-17 ENCOUNTER — Other Ambulatory Visit (HOSPITAL_COMMUNITY): Payer: Self-pay

## 2016-08-17 ENCOUNTER — Encounter (HOSPITAL_COMMUNITY): Payer: Self-pay

## 2016-08-17 VITALS — BP 117/69 | HR 96 | Wt 269.6 lb

## 2016-08-17 DIAGNOSIS — E7800 Pure hypercholesterolemia, unspecified: Secondary | ICD-10-CM

## 2016-08-17 DIAGNOSIS — Z794 Long term (current) use of insulin: Secondary | ICD-10-CM

## 2016-08-17 DIAGNOSIS — G43909 Migraine, unspecified, not intractable, without status migrainosus: Secondary | ICD-10-CM

## 2016-08-17 DIAGNOSIS — E890 Postprocedural hypothyroidism: Secondary | ICD-10-CM

## 2016-08-17 DIAGNOSIS — O99213 Obesity complicating pregnancy, third trimester: Secondary | ICD-10-CM | POA: Insufficient documentation

## 2016-08-17 DIAGNOSIS — E039 Hypothyroidism, unspecified: Secondary | ICD-10-CM | POA: Insufficient documentation

## 2016-08-17 DIAGNOSIS — G7000 Myasthenia gravis without (acute) exacerbation: Secondary | ICD-10-CM

## 2016-08-17 DIAGNOSIS — O99350 Diseases of the nervous system complicating pregnancy, unspecified trimester: Secondary | ICD-10-CM

## 2016-08-17 DIAGNOSIS — E669 Obesity, unspecified: Secondary | ICD-10-CM

## 2016-08-17 DIAGNOSIS — O09523 Supervision of elderly multigravida, third trimester: Secondary | ICD-10-CM | POA: Diagnosis not present

## 2016-08-17 DIAGNOSIS — O99280 Endocrine, nutritional and metabolic diseases complicating pregnancy, unspecified trimester: Secondary | ICD-10-CM

## 2016-08-17 DIAGNOSIS — O24319 Unspecified pre-existing diabetes mellitus in pregnancy, unspecified trimester: Secondary | ICD-10-CM

## 2016-08-17 DIAGNOSIS — K439 Ventral hernia without obstruction or gangrene: Secondary | ICD-10-CM

## 2016-08-17 DIAGNOSIS — E1121 Type 2 diabetes mellitus with diabetic nephropathy: Secondary | ICD-10-CM

## 2016-08-17 DIAGNOSIS — O24912 Unspecified diabetes mellitus in pregnancy, second trimester: Secondary | ICD-10-CM

## 2016-08-17 DIAGNOSIS — F329 Major depressive disorder, single episode, unspecified: Secondary | ICD-10-CM

## 2016-08-17 DIAGNOSIS — Z3A32 32 weeks gestation of pregnancy: Secondary | ICD-10-CM | POA: Insufficient documentation

## 2016-08-17 DIAGNOSIS — O24113 Pre-existing diabetes mellitus, type 2, in pregnancy, third trimester: Secondary | ICD-10-CM | POA: Diagnosis not present

## 2016-08-17 DIAGNOSIS — Z3A31 31 weeks gestation of pregnancy: Secondary | ICD-10-CM

## 2016-08-17 DIAGNOSIS — O99340 Other mental disorders complicating pregnancy, unspecified trimester: Secondary | ICD-10-CM

## 2016-08-17 DIAGNOSIS — O2693 Pregnancy related conditions, unspecified, third trimester: Secondary | ICD-10-CM | POA: Insufficient documentation

## 2016-08-17 DIAGNOSIS — F32A Depression, unspecified: Secondary | ICD-10-CM

## 2016-08-17 DIAGNOSIS — E1165 Type 2 diabetes mellitus with hyperglycemia: Secondary | ICD-10-CM

## 2016-08-17 DIAGNOSIS — O99283 Endocrine, nutritional and metabolic diseases complicating pregnancy, third trimester: Secondary | ICD-10-CM | POA: Insufficient documentation

## 2016-08-17 DIAGNOSIS — O0993 Supervision of high risk pregnancy, unspecified, third trimester: Secondary | ICD-10-CM

## 2016-08-17 DIAGNOSIS — F331 Major depressive disorder, recurrent, moderate: Secondary | ICD-10-CM

## 2016-08-17 DIAGNOSIS — IMO0002 Reserved for concepts with insufficient information to code with codable children: Secondary | ICD-10-CM

## 2016-08-17 DIAGNOSIS — G4733 Obstructive sleep apnea (adult) (pediatric): Secondary | ICD-10-CM

## 2016-08-17 DIAGNOSIS — E1169 Type 2 diabetes mellitus with other specified complication: Secondary | ICD-10-CM

## 2016-08-17 DIAGNOSIS — K219 Gastro-esophageal reflux disease without esophagitis: Secondary | ICD-10-CM

## 2016-08-17 DIAGNOSIS — J4530 Mild persistent asthma, uncomplicated: Secondary | ICD-10-CM

## 2016-08-17 DIAGNOSIS — M797 Fibromyalgia: Secondary | ICD-10-CM

## 2016-08-17 DIAGNOSIS — O99210 Obesity complicating pregnancy, unspecified trimester: Secondary | ICD-10-CM

## 2016-08-17 NOTE — Procedures (Signed)
Jade Mathis 04/02/1975 [redacted]w[redacted]d  Fetus A Non-Stress Test Interpretation for 08/17/16  Indication: Unsatisfactory BPP  Fetal Heart Rate A Mode: External Baseline Rate (A): 135 bpm Variability: Moderate Accelerations: 15 x 15 Decelerations: None Multiple birth?: No  Uterine Activity Mode: Palpation, Toco Contraction Frequency (min): U/I Contraction Duration (sec): 30-40 Contraction Quality: Mild Resting Tone Palpated: Relaxed Resting Time: Adequate  Interpretation (Fetal Testing) Nonstress Test Interpretation: Reactive Overall Impression: Reassuring for gestational age Comments: Reviewed tracing with Dr. Burnett Harry

## 2016-08-18 ENCOUNTER — Other Ambulatory Visit: Payer: Self-pay | Admitting: Obstetrics & Gynecology

## 2016-08-18 ENCOUNTER — Encounter: Payer: Self-pay | Admitting: Family Medicine

## 2016-08-18 ENCOUNTER — Encounter: Payer: Self-pay | Admitting: Dietician

## 2016-08-21 ENCOUNTER — Telehealth: Payer: Self-pay | Admitting: *Deleted

## 2016-08-21 ENCOUNTER — Ambulatory Visit (INDEPENDENT_AMBULATORY_CARE_PROVIDER_SITE_OTHER): Payer: Self-pay | Admitting: Podiatry

## 2016-08-21 ENCOUNTER — Other Ambulatory Visit: Payer: Self-pay | Admitting: Family Medicine

## 2016-08-21 DIAGNOSIS — E0842 Diabetes mellitus due to underlying condition with diabetic polyneuropathy: Secondary | ICD-10-CM

## 2016-08-21 DIAGNOSIS — Q6650 Congenital pes planus, unspecified foot: Secondary | ICD-10-CM

## 2016-08-21 DIAGNOSIS — L84 Corns and callosities: Secondary | ICD-10-CM

## 2016-08-21 NOTE — Telephone Encounter (Signed)
Per message from [place] called Jade Mathis and notified her that her wet prep was negative.  She voices understanding.

## 2016-08-21 NOTE — Progress Notes (Signed)
Patient presents for diabetic shoe pick up, shoes are tried on for good fit.  Patient received 1 Pair and 3 pairs custom molded diabetic inserts.  Verbal and written break in and wear instructions given.  Patient will follow up for scheduled routine care.   

## 2016-08-21 NOTE — Patient Instructions (Signed)

## 2016-08-24 ENCOUNTER — Ambulatory Visit (INDEPENDENT_AMBULATORY_CARE_PROVIDER_SITE_OTHER): Payer: Medicare Other | Admitting: Obstetrics & Gynecology

## 2016-08-24 ENCOUNTER — Telehealth: Payer: Self-pay | Admitting: *Deleted

## 2016-08-24 ENCOUNTER — Encounter: Payer: Self-pay | Admitting: Obstetrics & Gynecology

## 2016-08-24 ENCOUNTER — Ambulatory Visit (HOSPITAL_COMMUNITY)
Admission: RE | Admit: 2016-08-24 | Discharge: 2016-08-24 | Disposition: A | Discharge status: Home / Self Care | Payer: Medicare Other | Source: Ambulatory Visit | Attending: Obstetrics & Gynecology | Admitting: Obstetrics & Gynecology

## 2016-08-24 ENCOUNTER — Encounter (HOSPITAL_COMMUNITY): Payer: Self-pay

## 2016-08-24 ENCOUNTER — Other Ambulatory Visit (HOSPITAL_COMMUNITY): Payer: Self-pay

## 2016-08-24 VITALS — BP 129/76 | HR 101 | Wt 266.6 lb

## 2016-08-24 DIAGNOSIS — O0993 Supervision of high risk pregnancy, unspecified, third trimester: Secondary | ICD-10-CM

## 2016-08-24 DIAGNOSIS — Z3A33 33 weeks gestation of pregnancy: Secondary | ICD-10-CM | POA: Insufficient documentation

## 2016-08-24 DIAGNOSIS — O09523 Supervision of elderly multigravida, third trimester: Secondary | ICD-10-CM | POA: Insufficient documentation

## 2016-08-24 DIAGNOSIS — G7000 Myasthenia gravis without (acute) exacerbation: Secondary | ICD-10-CM | POA: Insufficient documentation

## 2016-08-24 DIAGNOSIS — O24913 Unspecified diabetes mellitus in pregnancy, third trimester: Secondary | ICD-10-CM | POA: Diagnosis present

## 2016-08-24 DIAGNOSIS — E039 Hypothyroidism, unspecified: Secondary | ICD-10-CM | POA: Insufficient documentation

## 2016-08-24 DIAGNOSIS — O99213 Obesity complicating pregnancy, third trimester: Secondary | ICD-10-CM | POA: Diagnosis not present

## 2016-08-24 DIAGNOSIS — O99283 Endocrine, nutritional and metabolic diseases complicating pregnancy, third trimester: Secondary | ICD-10-CM | POA: Insufficient documentation

## 2016-08-24 DIAGNOSIS — O99280 Endocrine, nutritional and metabolic diseases complicating pregnancy, unspecified trimester: Secondary | ICD-10-CM

## 2016-08-24 DIAGNOSIS — O2693 Pregnancy related conditions, unspecified, third trimester: Secondary | ICD-10-CM | POA: Diagnosis not present

## 2016-08-24 DIAGNOSIS — O24313 Unspecified pre-existing diabetes mellitus in pregnancy, third trimester: Secondary | ICD-10-CM | POA: Diagnosis not present

## 2016-08-24 DIAGNOSIS — O24113 Pre-existing diabetes mellitus, type 2, in pregnancy, third trimester: Secondary | ICD-10-CM | POA: Diagnosis not present

## 2016-08-24 DIAGNOSIS — O99353 Diseases of the nervous system complicating pregnancy, third trimester: Secondary | ICD-10-CM | POA: Diagnosis not present

## 2016-08-24 DIAGNOSIS — O24319 Unspecified pre-existing diabetes mellitus in pregnancy, unspecified trimester: Secondary | ICD-10-CM

## 2016-08-24 NOTE — Progress Notes (Signed)
Pt states she had a herpes break out last week. Pt was offered to see Roselyn Reef today due to PHQ-9 scale and she declined. BPP done today and weekly @ [facility].

## 2016-08-24 NOTE — Telephone Encounter (Signed)
After consult w/Dr. Roselie Awkward, I called pt and informed her that she will not need appt for NST as scheduled on 6/11. She should keep scheduled appts on 6/14 for BPP and Ob fu w/NST.  Pt voiced understanding.

## 2016-08-24 NOTE — Patient Instructions (Signed)
Gestational Diabetes Mellitus, Diagnosis Gestational diabetes (gestational diabetes mellitus) is a short-term (temporary) form of diabetes that can happen during pregnancy. It goes away after you give birth. It may be caused by one or both of these problems:  Your body does not make enough of a hormone called insulin.  Your body does not respond in a normal way to insulin that it makes.  Insulin lets sugars (glucose) go into cells in the body. This gives you energy. If you have diabetes, sugars cannot get into cells. This causes high blood sugar (hyperglycemia). If diabetes is treated, it may not hurt you or your baby. Your doctor will set treatment goals for you. In general, you should have these blood sugar levels:  After not eating for a long time (fasting): 95 mg/dL (5.3 mmol/L).  After meals (postprandial): ? One hour after a meal: at or below 140 mg/dL (7.8 mmol/L). ? Two hours after a meal: at or below 120 mg/dL (6.7 mmol/L).  A1c (hemoglobin A1c) level: 6-6.5%.  Follow these instructions at home: Questions to Ask Your Doctor  You may want to ask these questions:  Do I need to meet with a diabetes educator?  Where can I find a support group for people with diabetes?  What equipment will I need to care for myself at home?  What diabetes medicines do I need? When should I take them?  How often do I need to check my blood sugar?  What number can I call if I have questions?  When is my next doctor's visit?  General instructions  Take over-the-counter and prescription medicines only as told by your doctor.  Stay at a healthy weight during pregnancy.  Keep all follow-up visits as told by your doctor. This is important. Contact a doctor if:  Your blood sugar is at or above 240 mg/dL (13.3 mmol/L).  Your blood sugar is at or above 200 mg/dL (11.1 mmol/L) and you have ketones in your pee (urine).  You have been sick or have had a fever for 2 days or more and you are  not getting better.  You have any of these problems for more than 6 hours: ? You cannot eat or drink. ? You feel sick to your stomach (nauseous). ? You throw up (vomit). ? You have watery poop (diarrhea). Get help right away if:  Your blood sugar is lower than 54 mg/dL (3 mmol/L).  You get confused.  You have trouble: ? Thinking clearly. ? Breathing.  Your baby moves less than normal.  You have: ? Moderate or large ketone levels in your pee (urine). ? Bleeding from your vagina. ? Unusual fluid coming from your vagina. ? Early contractions. These may feel like tightness in your belly. This information is not intended to replace advice given to you by your health care provider. Make sure you discuss any questions you have with your health care provider. Document Released: 06/28/2015 Document Revised: 08/12/2015 Document Reviewed: 04/09/2015 Elsevier Interactive Patient Education  2018 [employer] Inc[employer]  

## 2016-08-24 NOTE — Progress Notes (Signed)
   PRENATAL VISIT NOTE  Subjective:  Jade Mathis is a 41 y.o. [phone] at [redacted]w[redacted]d being seen today for ongoing prenatal care.  She is currently monitored for the following issues for this high-risk pregnancy and has HYPOTHYROIDISM, POSTSURGICAL; Diabetes mellitus type 2 in obese ([place]); Obesity in pregnancy; DEPRESSION, MAJOR, RECURRENT, MODERATE; Migraine; Myasthenia gravis ([place]); Asthma; GERD; OSA (obstructive sleep apnea); Pure hypercholesterolemia; Fibromyalgia; Stool incontinence; Ventral hernia; Preexisting diabetes complicating pregnancy, antepartum; Myasthenia gravis complicating pregnancy ([place]); Depression affecting pregnancy, antepartum; Hypothyroidism affecting pregnancy, antepartum; Advanced maternal age in multigravida; Supervision of high-risk pregnancy; Diabetes mellitus affecting pregnancy, second trimester; and Type 2 diabetes mellitus, uncontrolled ([place]) on her problem list.  Patient reports fatigue.  Contractions: Irregular. Vag. Bleeding: None.  Movement: Present. Denies leaking of fluid.   The following portions of the patient's history were reviewed and updated as appropriate: allergies, current medications, past family history, past medical history, past social history, past surgical history and problem list. Problem list updated.  Objective:   Vitals:   08/24/16 0956  BP: 129/76  Pulse: (!) 101  Weight: 266 lb 9.6 oz (120.9 kg)    Fetal Status: Fetal Heart Rate (bpm): NST   Movement: Present     General:  Alert, oriented and cooperative. Patient is in no acute distress.  Skin: Skin is warm and dry. No rash noted.   Cardiovascular: Normal heart rate noted  Respiratory: Normal respiratory effort, no problems with respiration noted  Abdomen: Soft, gravid, appropriate for gestational age. Pain/Pressure: Present     Pelvic:  Cervical exam deferred        Extremities: Normal range of motion.  Edema: Trace  Mental Status: Normal mood and affect. Normal behavior. Normal  judgment and thought content.   Assessment and Plan:  Pregnancy: [id number] at [redacted]w[redacted]d  1. Elderly multigravida in third trimester NST rx, BPP 6/8 today - Fetal nonstress test  2. Preexisting diabetes complicating pregnancy, antepartum 8/10 - Fetal nonstress test  3. Supervision of high risk pregnancy in third trimester FBS 90-105 and pp 120 with one to 212, urged dietary compliance  4. Hypothyroidism affecting pregnancy, antepartum   Preterm labor symptoms and general obstetric precautions including but not limited to vaginal bleeding, contractions, leaking of fluid and fetal movement were reviewed in detail with the patient. Please refer to After Visit Summary for other counseling recommendations.  Return in about 4 days (around 08/28/2016) for as scheduled NST.   Emeterio Reeve, MD

## 2016-08-25 DIAGNOSIS — M5412 Radiculopathy, cervical region: Secondary | ICD-10-CM | POA: Diagnosis not present

## 2016-08-25 DIAGNOSIS — Z79899 Other long term (current) drug therapy: Secondary | ICD-10-CM | POA: Diagnosis not present

## 2016-08-28 ENCOUNTER — Other Ambulatory Visit: Payer: Self-pay | Admitting: Advanced Practice Midwife

## 2016-08-28 ENCOUNTER — Other Ambulatory Visit: Payer: Self-pay

## 2016-08-28 ENCOUNTER — Other Ambulatory Visit: Payer: Self-pay | Admitting: Family Medicine

## 2016-08-29 ENCOUNTER — Encounter: Payer: Self-pay | Admitting: Neurology

## 2016-08-30 ENCOUNTER — Other Ambulatory Visit: Payer: Self-pay | Admitting: Obstetrics & Gynecology

## 2016-08-31 ENCOUNTER — Ambulatory Visit (HOSPITAL_COMMUNITY)
Admission: RE | Admit: 2016-08-31 | Discharge: 2016-08-31 | Disposition: A | Discharge status: Home / Self Care | Payer: Medicare Other | Source: Ambulatory Visit | Attending: Obstetrics & Gynecology | Admitting: Obstetrics & Gynecology

## 2016-08-31 ENCOUNTER — Telehealth: Payer: Self-pay | Admitting: Neurology

## 2016-08-31 ENCOUNTER — Ambulatory Visit (INDEPENDENT_AMBULATORY_CARE_PROVIDER_SITE_OTHER): Payer: Medicare Other | Admitting: Obstetrics & Gynecology

## 2016-08-31 ENCOUNTER — Other Ambulatory Visit (HOSPITAL_COMMUNITY): Payer: Self-pay | Admitting: Obstetrics and Gynecology

## 2016-08-31 ENCOUNTER — Encounter (HOSPITAL_COMMUNITY): Payer: Self-pay

## 2016-08-31 ENCOUNTER — Other Ambulatory Visit (HOSPITAL_COMMUNITY): Payer: Self-pay | Admitting: *Deleted

## 2016-08-31 VITALS — BP 118/69 | HR 112 | Wt 265.8 lb

## 2016-08-31 DIAGNOSIS — O09523 Supervision of elderly multigravida, third trimester: Secondary | ICD-10-CM | POA: Diagnosis not present

## 2016-08-31 DIAGNOSIS — E669 Obesity, unspecified: Secondary | ICD-10-CM

## 2016-08-31 DIAGNOSIS — E1169 Type 2 diabetes mellitus with other specified complication: Secondary | ICD-10-CM

## 2016-08-31 DIAGNOSIS — O24319 Unspecified pre-existing diabetes mellitus in pregnancy, unspecified trimester: Secondary | ICD-10-CM

## 2016-08-31 DIAGNOSIS — Z794 Long term (current) use of insulin: Secondary | ICD-10-CM

## 2016-08-31 DIAGNOSIS — O24119 Pre-existing diabetes mellitus, type 2, in pregnancy, unspecified trimester: Secondary | ICD-10-CM

## 2016-08-31 DIAGNOSIS — O99213 Obesity complicating pregnancy, third trimester: Secondary | ICD-10-CM

## 2016-08-31 DIAGNOSIS — O0993 Supervision of high risk pregnancy, unspecified, third trimester: Secondary | ICD-10-CM

## 2016-08-31 DIAGNOSIS — Z3A34 34 weeks gestation of pregnancy: Secondary | ICD-10-CM | POA: Diagnosis not present

## 2016-08-31 MED ORDER — HUMALOG KWIKPEN 100 UNIT/ML ~~LOC~~ SOPN: PEN_INJECTOR | SUBCUTANEOUS | 5 refills | Status: DC

## 2016-08-31 NOTE — Progress Notes (Signed)
Pt was offered [facility] counseling during her visit due to PHQ9 score and she declined.

## 2016-08-31 NOTE — Progress Notes (Signed)
PRENATAL VISIT NOTE  Subjective:  Jade Mathis is a 41 y.o. [id number] at [redacted]w[redacted]d being seen today for ongoing prenatal care.  She is currently monitored for the following issues for this high-risk pregnancy and has HYPOTHYROIDISM, POSTSURGICAL; Diabetes mellitus type 2 in obese ([place]); Obesity in pregnancy; DEPRESSION, MAJOR, RECURRENT, MODERATE; Migraine; Myasthenia gravis ([place]); Asthma; GERD; OSA (obstructive sleep apnea); Pure hypercholesterolemia; Fibromyalgia; Stool incontinence; Ventral hernia; Preexisting diabetes complicating pregnancy, antepartum; Myasthenia gravis complicating pregnancy ([place]); Depression affecting pregnancy, antepartum; Hypothyroidism affecting pregnancy, antepartum; Advanced maternal age in multigravida; Supervision of high-risk pregnancy; Diabetes mellitus affecting pregnancy, second trimester; and Type 2 diabetes mellitus, uncontrolled ([place]) on her problem list.  Patient reports occasional contractions.  Denies leaking of fluid.   The following portions of the patient's history were reviewed and updated as appropriate: allergies, current medications, past family history, past medical history, past social history, past surgical history and problem list. Problem list updated.  Objective:   Vitals:   08/31/16 1100  BP: 118/69  Pulse: (!) 112  Weight: 265 lb 12.8 oz (120.6 kg)    Fetal Status: Fetal Heart Rate (bpm): NST   Movement: Present     General:  Alert, oriented and cooperative. Patient is in no acute distress.  Skin: Skin is warm and dry. No rash noted.   Cardiovascular: Normal heart rate noted  Respiratory: Normal respiratory effort, no problems with respiration noted  Abdomen: Soft, gravid, appropriate for gestational age. Pain/Pressure: Present     Pelvic:  Cervical exam deferred        Extremities: Normal range of motion.  Edema: Trace  Mental Status: Normal mood and affect. Normal behavior. Normal judgment and thought content.   [place] Mfm Fetal Bpp Wo  Non Stress  Result Date: 08/24/2016 ----------------------------------------------------------------------  OBSTETRICS REPORT                      (Signed Final 08/24/2016 09:44 am) ---------------------------------------------------------------------- Patient Info  ID #:       [id number]                         D.O.B.:   12-15-1975 (40 yrs)  Name:       Jade Mathis                   Visit Date:  08/24/2016 08:45 am ---------------------------------------------------------------------- Performed By  Performed By:     Valda Favia          Ref. Address:     [place]                    [place], [place]  [place]  Attending:        Wende Mott MD     Location:         [facility]  Referred By:      Osborne Oman MD ---------------------------------------------------------------------- Orders   #  Description                                 Code   1  [place] MFM FETAL BPP WO NON STRESS              76819.01  ----------------------------------------------------------------------   #  Ordered By               Order #        Accession #    Episode #   1  Verita Schneiders           [id number]      [id number]     [id number]  ---------------------------------------------------------------------- Indications   [redacted] weeks gestation of pregnancy                Z3A.33   Hypothyroid (post surgical) on replacement     O99.280 E03.9   Rx   Pre-existing diabetes, type 2, in pregnancy,   O24.113   third trimester (insulin); normal fetal ECHO   Advanced maternal age multigravida 78+,        O25.523   third trimester (low risk NIPS)   Obesity complicating pregnancy, third          O99.213   trimester   Medical complication of pregnancy:             O26.90   myasthenia gravis on Mestinon and   Prednisone   ---------------------------------------------------------------------- OB History  Gravidity:    6         Term:   3         SAB:   2 ---------------------------------------------------------------------- Fetal Evaluation  Num Of Fetuses:     1  Fetal Heart         159  Rate(bpm):  Cardiac Activity:   Observed  Presentation:       Cephalic  Placenta:           Anterior, above cervical os  Amniotic Fluid  AFI FV:      Subjectively within normal limits  AFI Sum(cm)     %Tile       Largest Pocket(cm)  13.78           47          4.71  RUQ(cm)       RLQ(cm)       LUQ(cm)        LLQ(cm)  2.88          4.71          2.62           3.57 ---------------------------------------------------------------------- Biophysical Evaluation  Amniotic F.V:   Within normal limits       F. Tone:        Observed  F. Movement:    Observed                   Score:          6/8  F. Breathing:   Not Observed ---------------------------------------------------------------------- Gestational Age  LMP:  33w 6d       Date:   12/31/15                 EDD:   10/06/16  Best:          33w 6d    Det. By:   LMP  (12/31/15)          EDD:   10/06/16 ---------------------------------------------------------------------- Impression  Indication: 41 yr old [id number] at [redacted]w[redacted]d with type II diabetes,  myasthenia gravis, and hypothyroidism for BPP.  Findings:  1. Single intrauterine pregnancy.  2. Anterior placenta without evidence of previa.  3. Normal amniotic fluid index.  4. Biophysical profile is 6/8 (-2 for breathing). ---------------------------------------------------------------------- Recommendations  1. Diabetes:  - on metformin and insulin  - recommend fetal growth every 4 weeks; due in 1 week  - recommend continue antenatal testing  - normal fetal echocardiogram  - recommend fetal kick counts  2. Myasthenia gravis:  - previously counseled  - on prednisone  3. Hypothyroidism:  - on synthroid  4. Advanced maternal age:  - low risk cell free  fetal DNA  - recommend fetal surveillance as above  5. BPP 6/8:  - patient sent to clinic for NST ----------------------------------------------------------------------                Wende Mott, MD Electronically Signed Final Report   08/24/2016 09:44 am ----------------------------------------------------------------------  [place] Mfm Fetal Bpp Wo Non Stress  Result Date: 08/18/2016 ----------------------------------------------------------------------  OBSTETRICS REPORT                      (Signed Final 08/18/2016 12:23 pm) ---------------------------------------------------------------------- Patient Info  ID #:       [id number]                         D.O.B.:   June 29, 1975 (40 yrs)  Name:       Jade Mathis                   Visit Date:  08/17/2016 09:08 am ---------------------------------------------------------------------- Performed By  Performed By:     Corky Crafts             Ref. Address:     [place]                    [place], [place]  Attending:  Renella Cunas MD       Location:         [facility]  Referred By:      Osborne Oman MD ---------------------------------------------------------------------- Orders   #  Description                                 Code   1  [place] MFM FETAL BPP WO NON STRESS              76819.01  ----------------------------------------------------------------------   #  Ordered By               Order #        Accession #    Episode #   1  Verita Schneiders           [id number]      [id number]     [id number]  ---------------------------------------------------------------------- Indications   [redacted] weeks gestation of pregnancy                Z3A.32   Hypothyroid (post surgical) on replacement     O99.280 E03.9   Rx   Pre-existing diabetes, type 2, in  pregnancy,   O24.113   third trimester (insulin); normal fetal ECHO   Advanced maternal age multigravida 61+,        O22.523   third trimester (low risk NIPS)   Obesity complicating pregnancy, third          O99.213   trimester   Medical complication of pregnancy:             O26.90   myasthenia gravis on Mestinon and   Prednisone  ---------------------------------------------------------------------- OB History  Gravidity:    6         Term:   3         SAB:   2 ---------------------------------------------------------------------- Fetal Evaluation  Num Of Fetuses:     1  Fetal Heart         153  Rate(bpm):  Cardiac Activity:   Observed  Presentation:       Cephalic  Placenta:           Anterior, above cervical os  Amniotic Fluid  AFI FV:      Subjectively within normal limits ---------------------------------------------------------------------- Biophysical Evaluation  Amniotic F.V:   Pocket => 2 cm two         F. Tone:        Observed                  planes  F. Movement:    Not Observed               N.S.T:          Reactive  F. Breathing:   Observed                   Score:          8/10 ---------------------------------------------------------------------- Gestational Age  LMP:           32w 6d       Date:   12/31/15                 EDD:   10/06/16  Best:          Milderd Meager 6d  Det. By:   LMP  (12/31/15)          EDD:   10/06/16 ---------------------------------------------------------------------- Anatomy  Stomach:               Appears normal, left   Bladder:                Appears normal                         sided ---------------------------------------------------------------------- Cervix Uterus Adnexa  Cervix  Not visualized (advanced GA >29wks) ---------------------------------------------------------------------- Impression  SIUP at 32+6 weeks  Normal amniotic fluid volume  BPP 8/10 (-2 for gross fetal movement because pt could not  tolerate lying down on [place] bed after  10 minutes)  ---------------------------------------------------------------------- Recommendations  Continue weekly BPPs or  twice weekly NSTs with weekly  AFIs  Growth [place] on 06/14 ----------------------------------------------------------------------                 Renella Cunas, MD Electronically Signed Final Report   08/18/2016 12:23 pm ----------------------------------------------------------------------  [place] Mfm Fetal Bpp Wo Non Stress  Result Date: 08/10/2016 ----------------------------------------------------------------------  OBSTETRICS REPORT                      (Signed Final 08/10/2016 09:43 am) ---------------------------------------------------------------------- Patient Info  ID #:       [id number]                         D.O.B.:   05-Jul-1975 (40 yrs)  Name:       Jade Mathis                   Visit Date:  08/10/2016 09:04 am ---------------------------------------------------------------------- Performed By  Performed By:     Valda Favia          Ref. Address:     [place]                    [place], [place]  Attending:        Seward Meth MD         Location:         [facility]  Referred By:      Osborne Oman MD ----------------------------------------------------------------------  Orders   #  Description                                 Code   1  [place] MFM FETAL BPP WO NON STRESS              76819.01  ----------------------------------------------------------------------   #  Ordered By               Order #        Accession #    Episode #   1  Verita Schneiders           [id number]      [id number]     [id number]  ---------------------------------------------------------------------- Indications   [redacted] weeks gestation of pregnancy                Z3A.31   Hypothyroid (post surgical)                     O99.280 E03.9   Pre-existing diabetes, type 2, in pregnancy,   O24.113   third trimester (insulin)   Advanced maternal age multigravida 44+,        O52.523   third trimester (low risk nips)   Obesity complicating pregnancy, third          O99.213   trimester  ---------------------------------------------------------------------- OB History  Gravidity:    6         Term:   3         SAB:   2 ---------------------------------------------------------------------- Fetal Evaluation  Num Of Fetuses:     1  Fetal Heart         157  Rate(bpm):  Cardiac Activity:   Observed  Presentation:       Cephalic  Amniotic Fluid  AFI FV:      Subjectively within normal limits  AFI Sum(cm)     %Tile       Largest Pocket(cm)  14.18           48          4.57  RUQ(cm)       RLQ(cm)       LUQ(cm)        LLQ(cm)  2.55          4.49          4.57           2.57 ---------------------------------------------------------------------- Biophysical Evaluation  Amniotic F.V:   Within normal limits       F. Tone:        Observed  F. Movement:    Observed                   Score:          8/8  F. Breathing:   Observed ---------------------------------------------------------------------- Gestational Age  LMP:           31w 6d       Date:   12/31/15                 EDD:   10/06/16  Best:          31w 6d    Det. By:   LMP  (12/31/15)          EDD:   10/06/16 ---------------------------------------------------------------------- Impression  Singleton intrauterine pregnancy at 31 weeks 6 days  gestation with fetal cardiac activity  Cephalic presentation  BPP  8/8 with an AFI > 14 cm ---------------------------------------------------------------------- Recommendations  Conitnue weekly BPPs and we will recheck growth serially ----------------------------------------------------------------------                   Seward Meth, MD Electronically Signed Final Report   08/10/2016 09:43 am  ----------------------------------------------------------------------  [place] Mfm Fetal Bpp Wo Non Stress  Result Date: 08/03/2016 ----------------------------------------------------------------------  OBSTETRICS REPORT                      (Signed Final 08/03/2016 09:51 am) ---------------------------------------------------------------------- Patient Info  ID #:       [id number]                         D.O.B.:   April 18, 1975 (40 yrs)  Name:       Jade Mathis                   Visit Date:  08/03/2016 08:57 am ---------------------------------------------------------------------- Performed By  Performed By:     Valda Favia          Ref. Address:     [address]                                                             [place], [place]  Attending:        Griffin Dakin MD         Location:         [facility]  Referred By:      Osborne Oman MD ---------------------------------------------------------------------- Orders   #  Description                                 Code   1  [place] MFM OB FOLLOW UP                         [phone]   2  [place] MFM FETAL BPP WO NON STRESS              14782.95  ----------------------------------------------------------------------   #  Ordered By               Order #        Accession #  Episode #   1  [place]              [id number]      [id number]     [id number]   2  [place]              [id number]      [id number]     [id number]  ---------------------------------------------------------------------- Indications   [redacted] weeks gestation of pregnancy                Z3A.30   Hypothyroid (post surgical)                    O99.280 E03.9   Pre-existing diabetes, type 2, in pregnancy,   O24.113   third trimester (insulin)   Advanced maternal age multigravida 30+,        O38.523   third trimester (low risk nips)    Obesity complicating pregnancy, third          O99.213   trimester  ---------------------------------------------------------------------- OB History  Gravidity:    6         Term:   3         SAB:   2 ---------------------------------------------------------------------- Fetal Evaluation  Num Of Fetuses:     1  Fetal Heart         134  Rate(bpm):  Cardiac Activity:   Observed  Presentation:       Cephalic  Placenta:           Anterior, above cervical os  P. Cord Insertion:  Visualized, central  Amniotic Fluid  AFI FV:      Subjectively within normal limits  AFI Sum(cm)     %Tile       Largest Pocket(cm)  14.86           52          5.12  RUQ(cm)       RLQ(cm)       LUQ(cm)        LLQ(cm)  3.98          5.12          3.39           2.37 ---------------------------------------------------------------------- Biophysical Evaluation  Amniotic F.V:   Within normal limits       F. Tone:        Observed  F. Movement:    Observed                   Score:          8/8  F. Breathing:   Observed ---------------------------------------------------------------------- Biometry  BPD:      74.4  mm     G. Age:  29w 6d         13  %    CI:        69.57   %   70 - 86                                                          FL/HC:      21.0   %   19.3 - 21.3  HC:      284.7  mm     G. Age:  16w 2d  25  %    HC/AC:      0.98       0.96 - 1.17  AC:      290.7  mm     G. Age:  33w 0d         94  %    FL/BPD:     80.5   %   71 - 87  FL:       59.9  mm     G. Age:  31w 1d         45  %    FL/AC:      20.6   %   20 - 24  HUM:      55.3  mm     G. Age:  32w 1d         78  %  Est. FW:    1890  gm      4 lb 3 oz     76  % ---------------------------------------------------------------------- Gestational Age  LMP:           30w 6d       Date:   12/31/15                 EDD:   10/06/16  U/S Today:     31w 2d                                        EDD:   10/03/16  Best:          30w 6d    Det. By:   LMP  (12/31/15)          EDD:    10/06/16 ---------------------------------------------------------------------- Anatomy  Cranium:               Appears normal         Aortic Arch:            Previously seen  Cavum:                 Previously seen        Ductal Arch:            Previously seen  Ventricles:            Previously seen        Diaphragm:              Previously seen  Choroid Plexus:        Appears normal         Stomach:                Appears normal, left                                                                        sided  Cerebellum:            Previously seen        Abdomen:                Previously seen  Posterior Fossa:       Previously seen  Abdominal Wall:         Previously seen  Nuchal Fold:           Previously seen        Cord Vessels:           Previously seen  Face:                  Orbits and profile     Kidneys:                Appear normal                         previously seen  Lips:                  Previously seen        Bladder:                Appears normal  Thoracic:              Appears normal         Spine:                  Previously seen  Heart:                 Appears normal         Upper Extremities:      Previously seen                         (4CH, axis, and situs  RVOT:                  Previously seen        Lower Extremities:      Previously seen  LVOT:                  Previously seen  Other:  Female gender.Heels and Nasal bone prev. visualized. Technically          difficult due to maternal habitus and fetal position. ---------------------------------------------------------------------- Cervix Uterus Adnexa  Cervix  Not visualized (advanced GA >29wks)  Uterus  No abnormality visualized.  Left Ovary  No adnexal mass visualized.  Right Ovary  No adnexal mass visualized.  Cul De Sac:   No free fluid seen.  Adnexa:       No abnormality visualized. ---------------------------------------------------------------------- Impression  Singleton intrauterine pregnancy at 30+6 weeks with type 2   DM, advanced maternal age, and post-surgical  hypothyroidism  Interval review of the anatomy shows no sonographic  markers for aneuploidy or structural anomalies  Amniotic fluid volume is normal with AFI of 14.9cm  Estimated fetal weight is 1890g which is growth in the 76th  percentile  BPP 8/8 ---------------------------------------------------------------------- Recommendations  Conitnue weekly BPPs and we will recheck growth in 4 weeks ----------------------------------------------------------------------                 Griffin Dakin, MD Electronically Signed Final Report   08/03/2016 09:51 am ----------------------------------------------------------------------  [place] Mfm Ob Follow Up  Result Date: 08/31/2016 ----------------------------------------------------------------------  OBSTETRICS REPORT                      (Signed Final 08/31/2016 09:53 am) ---------------------------------------------------------------------- Patient Info  ID #:       [id number]  D.O.B.:   1976-02-03 (40 yrs)  Name:       HANNELORE BOVA                   Visit Date:  08/31/2016 08:10 am ---------------------------------------------------------------------- Performed By  Performed By:     Hubert Azure          Ref. Address:     [address]                                                             [place], [place]  Attending:        Renella Cunas MD       Location:         [facility]  Referred By:      Osborne Oman MD ---------------------------------------------------------------------- Orders   #  Description                                 Code   1  [place] MFM OB FOLLOW UP                         [phone]  ----------------------------------------------------------------------   #  Ordered By               Order #        Accession  #    Episode #   1  Seward Meth              [id number]      [id number]     [id number]  ---------------------------------------------------------------------- Indications   [redacted] weeks gestation of pregnancy                Z3A.34   Hypothyroid (post surgical) on replacement     O99.280 E03.9   Rx   Pre-existing diabetes, type 2, in pregnancy,   O24.113   third trimester (insulin); normal fetal ECHO   Advanced maternal age multigravida 27+,        O29.523   third trimester (low risk NIPS)   Obesity complicating pregnancy, third          O99.213   trimester   Medical complication of pregnancy:             O26.90  myasthenia gravis on Mestinon and   Prednisone  ---------------------------------------------------------------------- OB History  Gravidity:    6         Term:   3         SAB:   2 ---------------------------------------------------------------------- Fetal Evaluation  Num Of Fetuses:     1  Fetal Heart         134  Rate(bpm):  Cardiac Activity:   Observed  Presentation:       Cephalic  Placenta:           Anterior, above cervical os  P. Cord Insertion:  Visualized, central  Amniotic Fluid  AFI FV:      Subjectively within normal limits  AFI Sum(cm)     %Tile       Largest Pocket(cm)  14.06           50          4.41  RUQ(cm)       RLQ(cm)       LUQ(cm)        LLQ(cm)  4.06          1.83          3.76           4.41 ---------------------------------------------------------------------- Biometry  BPD:      87.1  mm     G. Age:  35w 1d         61  %    CI:         73.5   %   70 - 86                                                          FL/HC:      22.3   %   20.1 - 22.3  HC:      322.8  mm     G. Age:  36w 3d         87  %    HC/AC:      0.94       0.93 - 1.11  AC:      344.5  mm     G. Age:  38w 2d       > 97  %    FL/BPD:     82.5   %   71 - 87  FL:       71.9  mm     G. Age:  36w 6d         87  %    FL/AC:      20.9   %   20 - 24  Est. FW:    3198  gm      7 lb 1 oz   > 90  %  ---------------------------------------------------------------------- Gestational Age  LMP:           34w 6d       Date:   12/31/15                 EDD:   10/06/16  U/S Today:     36w 5d  EDD:   09/23/16  Best:          34w 6d    Det. By:   LMP  (12/31/15)          EDD:   10/06/16 ---------------------------------------------------------------------- Anatomy  Cranium:               Appears normal         Aortic Arch:            Previously seen  Cavum:                 Appears normal         Ductal Arch:            Previously seen  Ventricles:            Appears normal         Diaphragm:              Previously seen  Choroid Plexus:        Previously seen        Stomach:                Appears normal, left                                                                        sided  Cerebellum:            Previously seen        Abdomen:                Previously seen  Posterior Fossa:       Previously seen        Abdominal Wall:         Previously seen  Nuchal Fold:           Previously seen        Cord Vessels:           Previously seen  Face:                  Orbits and profile     Kidneys:                Appear normal                         previously seen  Lips:                  Previously seen        Bladder:                Appears normal  Thoracic:              Previously seen        Spine:                  Previously seen  Heart:                 Previously seen        Upper Extremities:      Previously seen  RVOT:                  Previously seen  Lower Extremities:      Previously seen  LVOT:                  Previously seen  Other:  Female gender.Heels and Nasal bone prev. visualized. Technically          difficult due to maternal habitus and fetal position. ---------------------------------------------------------------------- Cervix Uterus Adnexa  Cervix  Not visualized (advanced GA >29wks) ----------------------------------------------------------------------  Impression  SIUP at 34+6 weeks  Normal interval anatomy; anatomic survey complete  Normal amniotic fluid volume  EFW > 90th %tile; AC > 97th %tile; 3198 grams; 7+1 ---------------------------------------------------------------------- Recommendations  Continue twice weekly NSTs with weekly AFIs or weekly BPPs  Growth in 3 weeks for EFW ----------------------------------------------------------------------                 Renella Cunas, MD Electronically Signed Final Report   08/31/2016 09:53 am ----------------------------------------------------------------------  [place] Mfm Ob Follow Up  Result Date: 08/03/2016 ----------------------------------------------------------------------  OBSTETRICS REPORT                      (Signed Final 08/03/2016 09:51 am) ---------------------------------------------------------------------- Patient Info  ID #:       [id number]                         D.O.B.:   1976/03/01 (40 yrs)  Name:       Jade Mathis                   Visit Date:  08/03/2016 08:57 am ---------------------------------------------------------------------- Performed By  Performed By:     Valda Favia          Ref. Address:     [address]                                                             [place], [place]  Attending:        Griffin Dakin MD         Location:         [facility]  Referred By:      Osborne Oman MD ---------------------------------------------------------------------- Orders   #  Description  Code   1  [place] MFM OB FOLLOW UP                         [id number]   2  [place] MFM FETAL BPP WO NON STRESS              76819.01  ----------------------------------------------------------------------   #  Ordered By               Order #        Accession #    Episode #   1  [place]               [id number]      [id number]     [id number]   2  [place]              [id number]      [id number]     [id number]  ---------------------------------------------------------------------- Indications   [redacted] weeks gestation of pregnancy                Z3A.30   Hypothyroid (post surgical)                    O99.280 E03.9   Pre-existing diabetes, type 2, in pregnancy,   O24.113   third trimester (insulin)   Advanced maternal age multigravida 49+,        O52.523   third trimester (low risk nips)   Obesity complicating pregnancy, third          O99.213   trimester  ---------------------------------------------------------------------- OB History  Gravidity:    6         Term:   3         SAB:   2 ---------------------------------------------------------------------- Fetal Evaluation  Num Of Fetuses:     1  Fetal Heart         134  Rate(bpm):  Cardiac Activity:   Observed  Presentation:       Cephalic  Placenta:           Anterior, above cervical os  P. Cord Insertion:  Visualized, central  Amniotic Fluid  AFI FV:      Subjectively within normal limits  AFI Sum(cm)     %Tile       Largest Pocket(cm)  14.86           52          5.12  RUQ(cm)       RLQ(cm)       LUQ(cm)        LLQ(cm)  3.98          5.12          3.39           2.37 ---------------------------------------------------------------------- Biophysical Evaluation  Amniotic F.V:   Within normal limits       F. Tone:        Observed  F. Movement:    Observed                   Score:          8/8  F. Breathing:   Observed ---------------------------------------------------------------------- Biometry  BPD:      74.4  mm     G. Age:  29w 6d         13  %    CI:        69.57   %   70 - 86  FL/HC:      21.0   %   19.3 - 21.3  HC:      284.7  mm     G. Age:  31w 2d         25  %    HC/AC:      0.98       0.96 - 1.17  AC:      290.7  mm     G. Age:  33w 0d         94  %    FL/BPD:     80.5   %   71 - 87  FL:       59.9   mm     G. Age:  31w 1d         45  %    FL/AC:      20.6   %   20 - 24  HUM:      55.3  mm     G. Age:  32w 1d         78  %  Est. FW:    1890  gm      4 lb 3 oz     76  % ---------------------------------------------------------------------- Gestational Age  LMP:           30w 6d       Date:   12/31/15                 EDD:   10/06/16  U/S Today:     31w 2d                                        EDD:   10/03/16  Best:          30w 6d    Det. By:   LMP  (12/31/15)          EDD:   10/06/16 ---------------------------------------------------------------------- Anatomy  Cranium:               Appears normal         Aortic Arch:            Previously seen  Cavum:                 Previously seen        Ductal Arch:            Previously seen  Ventricles:            Previously seen        Diaphragm:              Previously seen  Choroid Plexus:        Appears normal         Stomach:                Appears normal, left                                                                        sided  Cerebellum:            Previously seen  Abdomen:                Previously seen  Posterior Fossa:       Previously seen        Abdominal Wall:         Previously seen  Nuchal Fold:           Previously seen        Cord Vessels:           Previously seen  Face:                  Orbits and profile     Kidneys:                Appear normal                         previously seen  Lips:                  Previously seen        Bladder:                Appears normal  Thoracic:              Appears normal         Spine:                  Previously seen  Heart:                 Appears normal         Upper Extremities:      Previously seen                         (4CH, axis, and situs  RVOT:                  Previously seen        Lower Extremities:      Previously seen  LVOT:                  Previously seen  Other:  Female gender.Heels and Nasal bone prev. visualized. Technically          difficult due to maternal habitus and fetal  position. ---------------------------------------------------------------------- Cervix Uterus Adnexa  Cervix  Not visualized (advanced GA >29wks)  Uterus  No abnormality visualized.  Left Ovary  No adnexal mass visualized.  Right Ovary  No adnexal mass visualized.  Cul De Sac:   No free fluid seen.  Adnexa:       No abnormality visualized. ---------------------------------------------------------------------- Impression  Singleton intrauterine pregnancy at 30+6 weeks with type 2  DM, advanced maternal age, and post-surgical  hypothyroidism  Interval review of the anatomy shows no sonographic  markers for aneuploidy or structural anomalies  Amniotic fluid volume is normal with AFI of 14.9cm  Estimated fetal weight is 1890g which is growth in the 76th  percentile  BPP 8/8 ---------------------------------------------------------------------- Recommendations  Conitnue weekly BPPs and we will recheck growth in 4 weeks ----------------------------------------------------------------------                 Griffin Dakin, MD Electronically Signed Final Report   08/03/2016 09:51 am ----------------------------------------------------------------------   Assessment and Plan:  Pregnancy: [id number] at [redacted]w[redacted]d  1. Diabetes mellitus type 2 in obese ([place]) 2. Preexisting diabetes complicating pregnancy, antepartum  Increased Humalog to 22/32/32  BPP 10/10 today, normal AFV. Continue testing. - [place]  MFM FETAL BPP WO NON STRESS; Future - Fetal nonstress test - HUMALOG KWIKPEN 100 UNIT/ML KiwkPen; 22 units AC Breakfast/32 AC Lunch/32 AC Dinner  Dispense: 30 mL; Refill: 5  3. Supervision of high risk pregnancy in third trimester Preterm labor symptoms and general obstetric precautions including but not limited to vaginal bleeding, contractions, leaking of fluid and fetal movement were reviewed in detail with the patient. Please refer to After Visit Summary for other counseling recommendations.  Return in about 7 days (around  09/07/2016) for as scheduled.   Verita Schneiders, MD

## 2016-08-31 NOTE — Telephone Encounter (Signed)
PT called and said she left a My Chart message 2 days ago and has not heard back from anyone

## 2016-08-31 NOTE — Patient Instructions (Addendum)
Return to clinic for any scheduled appointments or obstetric concerns, or go to MAU for evaluation   Humalog  Continue Breakfast at 22 Increase Lunch and Dinner to 32

## 2016-09-01 ENCOUNTER — Other Ambulatory Visit: Payer: Medicare Other

## 2016-09-03 ENCOUNTER — Encounter (HOSPITAL_COMMUNITY): Payer: Self-pay

## 2016-09-03 ENCOUNTER — Inpatient Hospital Stay (HOSPITAL_COMMUNITY)
Admission: AD | Admit: 2016-09-03 | Discharge: 2016-09-03 | Disposition: A | Discharge status: Home / Self Care | Payer: Medicare Other | Source: Ambulatory Visit | Attending: Obstetrics & Gynecology | Admitting: Obstetrics & Gynecology

## 2016-09-03 DIAGNOSIS — E282 Polycystic ovarian syndrome: Secondary | ICD-10-CM | POA: Insufficient documentation

## 2016-09-03 DIAGNOSIS — O99353 Diseases of the nervous system complicating pregnancy, third trimester: Secondary | ICD-10-CM | POA: Diagnosis not present

## 2016-09-03 DIAGNOSIS — R109 Unspecified abdominal pain: Secondary | ICD-10-CM | POA: Insufficient documentation

## 2016-09-03 DIAGNOSIS — O99513 Diseases of the respiratory system complicating pregnancy, third trimester: Secondary | ICD-10-CM | POA: Diagnosis not present

## 2016-09-03 DIAGNOSIS — Z888 Allergy status to other drugs, medicaments and biological substances status: Secondary | ICD-10-CM | POA: Insufficient documentation

## 2016-09-03 DIAGNOSIS — M797 Fibromyalgia: Secondary | ICD-10-CM | POA: Diagnosis not present

## 2016-09-03 DIAGNOSIS — Z833 Family history of diabetes mellitus: Secondary | ICD-10-CM | POA: Insufficient documentation

## 2016-09-03 DIAGNOSIS — O24113 Pre-existing diabetes mellitus, type 2, in pregnancy, third trimester: Secondary | ICD-10-CM | POA: Diagnosis not present

## 2016-09-03 DIAGNOSIS — Z9049 Acquired absence of other specified parts of digestive tract: Secondary | ICD-10-CM | POA: Diagnosis not present

## 2016-09-03 DIAGNOSIS — Z818 Family history of other mental and behavioral disorders: Secondary | ICD-10-CM | POA: Insufficient documentation

## 2016-09-03 DIAGNOSIS — Z3A35 35 weeks gestation of pregnancy: Secondary | ICD-10-CM | POA: Diagnosis not present

## 2016-09-03 DIAGNOSIS — O26893 Other specified pregnancy related conditions, third trimester: Secondary | ICD-10-CM | POA: Diagnosis not present

## 2016-09-03 DIAGNOSIS — Z9889 Other specified postprocedural states: Secondary | ICD-10-CM | POA: Diagnosis not present

## 2016-09-03 DIAGNOSIS — J45909 Unspecified asthma, uncomplicated: Secondary | ICD-10-CM | POA: Insufficient documentation

## 2016-09-03 DIAGNOSIS — Z885 Allergy status to narcotic agent status: Secondary | ICD-10-CM | POA: Insufficient documentation

## 2016-09-03 DIAGNOSIS — E119 Type 2 diabetes mellitus without complications: Secondary | ICD-10-CM | POA: Insufficient documentation

## 2016-09-03 DIAGNOSIS — O4703 False labor before 37 completed weeks of gestation, third trimester: Secondary | ICD-10-CM

## 2016-09-03 DIAGNOSIS — F419 Anxiety disorder, unspecified: Secondary | ICD-10-CM | POA: Diagnosis not present

## 2016-09-03 DIAGNOSIS — Z794 Long term (current) use of insulin: Secondary | ICD-10-CM | POA: Insufficient documentation

## 2016-09-03 DIAGNOSIS — Z801 Family history of malignant neoplasm of trachea, bronchus and lung: Secondary | ICD-10-CM | POA: Diagnosis not present

## 2016-09-03 DIAGNOSIS — G4730 Sleep apnea, unspecified: Secondary | ICD-10-CM | POA: Diagnosis not present

## 2016-09-03 DIAGNOSIS — O99613 Diseases of the digestive system complicating pregnancy, third trimester: Secondary | ICD-10-CM | POA: Diagnosis not present

## 2016-09-03 DIAGNOSIS — M545 Low back pain: Secondary | ICD-10-CM | POA: Insufficient documentation

## 2016-09-03 DIAGNOSIS — R110 Nausea: Secondary | ICD-10-CM | POA: Diagnosis not present

## 2016-09-03 DIAGNOSIS — O99283 Endocrine, nutritional and metabolic diseases complicating pregnancy, third trimester: Secondary | ICD-10-CM | POA: Insufficient documentation

## 2016-09-03 DIAGNOSIS — F319 Bipolar disorder, unspecified: Secondary | ICD-10-CM | POA: Insufficient documentation

## 2016-09-03 DIAGNOSIS — Z8249 Family history of ischemic heart disease and other diseases of the circulatory system: Secondary | ICD-10-CM | POA: Insufficient documentation

## 2016-09-03 DIAGNOSIS — O99343 Other mental disorders complicating pregnancy, third trimester: Secondary | ICD-10-CM | POA: Diagnosis not present

## 2016-09-03 DIAGNOSIS — Z79899 Other long term (current) drug therapy: Secondary | ICD-10-CM | POA: Insufficient documentation

## 2016-09-03 LAB — URINALYSIS, ROUTINE W REFLEX MICROSCOPIC
Bilirubin Urine: NEGATIVE
Glucose, UA: NEGATIVE mg/dL
Hgb urine dipstick: NEGATIVE
Ketones, ur: NEGATIVE mg/dL
Leukocytes, UA: NEGATIVE
Nitrite: NEGATIVE
Protein, ur: NEGATIVE mg/dL
Specific Gravity, Urine: 1.012 (ref 1.005–1.030)
pH: 5 (ref 5.0–8.0)

## 2016-09-03 MED ORDER — ONDANSETRON 8 MG PO TBDP
8.0000 mg | ORAL_TABLET | Freq: Once | ORAL | Status: AC
  Administered 2016-09-03: 8 mg via ORAL
  Filled 2016-09-03: qty 1

## 2016-09-03 NOTE — Discharge Instructions (Signed)
Braxton Hicks Contractions °Contractions of the uterus can occur throughout pregnancy, but they are not always a sign that you are in labor. You may have practice contractions called Braxton Hicks contractions. These false labor contractions are sometimes confused with true labor. °What are Braxton Hicks contractions? °Braxton Hicks contractions are tightening movements that occur in the muscles of the uterus before labor. Unlike true labor contractions, these contractions do not result in opening (dilation) and thinning of the cervix. Toward the end of pregnancy (32-34 weeks), Braxton Hicks contractions can happen more often and may become stronger. These contractions are sometimes difficult to tell apart from true labor because they can be very uncomfortable. You should not feel embarrassed if you go to the hospital with false labor. °Sometimes, the only way to tell if you are in true labor is for your health care provider to look for changes in the cervix. The health care provider will do a physical exam and may monitor your contractions. If you are not in true labor, the exam should show that your cervix is not dilating and your water has not broken. °If there are no prenatal problems or other health problems associated with your pregnancy, it is completely safe for you to be sent home with false labor. You may continue to have Braxton Hicks contractions until you go into true labor. °How can I tell the difference between true labor and false labor? °· Differences °? False labor °? Contractions last 30-70 seconds.: Contractions are usually shorter and not as strong as true labor contractions. °? Contractions become very regular.: Contractions are usually irregular. °? Discomfort is usually felt in the top of the uterus, and it spreads to the lower abdomen and low back.: Contractions are often felt in the front of the lower abdomen and in the groin. °? Contractions do not go away with walking.: Contractions may  go away when you walk around or change positions while lying down. °? Contractions usually become more intense and increase in frequency.: Contractions get weaker and are shorter-lasting as time goes on. °? The cervix dilates and gets thinner.: The cervix usually does not dilate or become thin. °Follow these instructions at home: °· Take over-the-counter and prescription medicines only as told by your health care provider. °· Keep up with your usual exercises and follow other instructions from your health care provider. °· Eat and drink lightly if you think you are going into labor. °· If Braxton Hicks contractions are making you uncomfortable: °? Change your position from lying down or resting to walking, or change from walking to resting. °? Sit and rest in a tub of warm water. °? Drink enough fluid to keep your urine clear or pale yellow. Dehydration may cause these contractions. °? Do slow and deep breathing several times an hour. °· Keep all follow-up prenatal visits as told by your health care provider. This is important. °Contact a health care provider if: °· You have a fever. °· You have continuous pain in your abdomen. °Get help right away if: °· Your contractions become stronger, more regular, and closer together. °· You have fluid leaking or gushing from your vagina. °· You pass blood-tinged mucus (bloody show). °· You have bleeding from your vagina. °· You have low back pain that you never had before. °· You feel your baby’s head pushing down and causing pelvic pressure. °· Your baby is not moving inside you as much as it used to. °Summary °· Contractions that occur before labor are   called Braxton Hicks contractions, false labor, or practice contractions. °· Braxton Hicks contractions are usually shorter, weaker, farther apart, and less regular than true labor contractions. True labor contractions usually become progressively stronger and regular and they become more frequent. °· Manage discomfort from  Braxton Hicks contractions by changing position, resting in a warm bath, drinking plenty of water, or practicing deep breathing. °This information is not intended to replace advice given to you by your health care provider. Make sure you discuss any questions you have with your health care provider. °Document Released: 03/06/2005 Document Revised: 01/24/2016 Document Reviewed: 01/24/2016 °Elsevier Interactive Patient Education © 2017 [employer] ° °

## 2016-09-03 NOTE — Progress Notes (Addendum)
G6P3 @ 35.[redacted] wksga. Presents to triage today for "loose stools" and abdominal cramping. Denies LOF or bleeding. + FM.   EFM applied.  DM type ll on insulin levamierr, humalog and NPH.   1215: Provider at bs assessing.  SVE 1.5/thick/posterior  1315: Provider at bs reassessing pt. Order received to d/c pt home.   1320: Discharge instructions given with pt and SO understanding.   1335: pt came out of room enquiring what is delay for d/c papers. Provider made aware needs dx and d/c orders.   1342: provider made aware dc order missing.   1345: pt left unit via ambulatory with SO with d/c pint out.
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
## 2016-09-04 ENCOUNTER — Encounter (HOSPITAL_COMMUNITY): Payer: Self-pay

## 2016-09-04 ENCOUNTER — Inpatient Hospital Stay (HOSPITAL_COMMUNITY)
Admission: AD | Admit: 2016-09-04 | Discharge: 2016-09-04 | Disposition: A | Discharge status: Home / Self Care | Payer: Medicare Other | Source: Ambulatory Visit | Attending: Obstetrics and Gynecology | Admitting: Obstetrics and Gynecology

## 2016-09-04 DIAGNOSIS — M545 Low back pain: Secondary | ICD-10-CM | POA: Diagnosis not present

## 2016-09-04 DIAGNOSIS — O99613 Diseases of the digestive system complicating pregnancy, third trimester: Secondary | ICD-10-CM | POA: Diagnosis not present

## 2016-09-04 DIAGNOSIS — Z794 Long term (current) use of insulin: Secondary | ICD-10-CM | POA: Insufficient documentation

## 2016-09-04 DIAGNOSIS — Z8249 Family history of ischemic heart disease and other diseases of the circulatory system: Secondary | ICD-10-CM | POA: Diagnosis not present

## 2016-09-04 DIAGNOSIS — F319 Bipolar disorder, unspecified: Secondary | ICD-10-CM | POA: Insufficient documentation

## 2016-09-04 DIAGNOSIS — F329 Major depressive disorder, single episode, unspecified: Secondary | ICD-10-CM

## 2016-09-04 DIAGNOSIS — Z885 Allergy status to narcotic agent status: Secondary | ICD-10-CM | POA: Insufficient documentation

## 2016-09-04 DIAGNOSIS — E119 Type 2 diabetes mellitus without complications: Secondary | ICD-10-CM | POA: Diagnosis not present

## 2016-09-04 DIAGNOSIS — O99283 Endocrine, nutritional and metabolic diseases complicating pregnancy, third trimester: Secondary | ICD-10-CM | POA: Insufficient documentation

## 2016-09-04 DIAGNOSIS — O368130 Decreased fetal movements, third trimester, not applicable or unspecified: Secondary | ICD-10-CM | POA: Diagnosis not present

## 2016-09-04 DIAGNOSIS — Z833 Family history of diabetes mellitus: Secondary | ICD-10-CM | POA: Insufficient documentation

## 2016-09-04 DIAGNOSIS — M797 Fibromyalgia: Secondary | ICD-10-CM | POA: Diagnosis not present

## 2016-09-04 DIAGNOSIS — E282 Polycystic ovarian syndrome: Secondary | ICD-10-CM | POA: Insufficient documentation

## 2016-09-04 DIAGNOSIS — O99340 Other mental disorders complicating pregnancy, unspecified trimester: Secondary | ICD-10-CM

## 2016-09-04 DIAGNOSIS — J45909 Unspecified asthma, uncomplicated: Secondary | ICD-10-CM | POA: Insufficient documentation

## 2016-09-04 DIAGNOSIS — O99343 Other mental disorders complicating pregnancy, third trimester: Secondary | ICD-10-CM | POA: Insufficient documentation

## 2016-09-04 DIAGNOSIS — G7000 Myasthenia gravis without (acute) exacerbation: Secondary | ICD-10-CM | POA: Diagnosis not present

## 2016-09-04 DIAGNOSIS — Z9049 Acquired absence of other specified parts of digestive tract: Secondary | ICD-10-CM | POA: Insufficient documentation

## 2016-09-04 DIAGNOSIS — Z9889 Other specified postprocedural states: Secondary | ICD-10-CM | POA: Insufficient documentation

## 2016-09-04 DIAGNOSIS — O4703 False labor before 37 completed weeks of gestation, third trimester: Secondary | ICD-10-CM | POA: Diagnosis not present

## 2016-09-04 DIAGNOSIS — Z801 Family history of malignant neoplasm of trachea, bronchus and lung: Secondary | ICD-10-CM | POA: Insufficient documentation

## 2016-09-04 DIAGNOSIS — E0500 Thyrotoxicosis with diffuse goiter without thyrotoxic crisis or storm: Secondary | ICD-10-CM | POA: Diagnosis not present

## 2016-09-04 DIAGNOSIS — O24319 Unspecified pre-existing diabetes mellitus in pregnancy, unspecified trimester: Secondary | ICD-10-CM

## 2016-09-04 DIAGNOSIS — F32A Depression, unspecified: Secondary | ICD-10-CM

## 2016-09-04 DIAGNOSIS — O26893 Other specified pregnancy related conditions, third trimester: Secondary | ICD-10-CM | POA: Insufficient documentation

## 2016-09-04 DIAGNOSIS — O99353 Diseases of the nervous system complicating pregnancy, third trimester: Secondary | ICD-10-CM | POA: Diagnosis not present

## 2016-09-04 DIAGNOSIS — O99513 Diseases of the respiratory system complicating pregnancy, third trimester: Secondary | ICD-10-CM | POA: Insufficient documentation

## 2016-09-04 DIAGNOSIS — F419 Anxiety disorder, unspecified: Secondary | ICD-10-CM | POA: Insufficient documentation

## 2016-09-04 DIAGNOSIS — K219 Gastro-esophageal reflux disease without esophagitis: Secondary | ICD-10-CM | POA: Insufficient documentation

## 2016-09-04 DIAGNOSIS — E039 Hypothyroidism, unspecified: Secondary | ICD-10-CM

## 2016-09-04 DIAGNOSIS — G4730 Sleep apnea, unspecified: Secondary | ICD-10-CM | POA: Insufficient documentation

## 2016-09-04 DIAGNOSIS — R109 Unspecified abdominal pain: Secondary | ICD-10-CM | POA: Insufficient documentation

## 2016-09-04 DIAGNOSIS — Z888 Allergy status to other drugs, medicaments and biological substances status: Secondary | ICD-10-CM | POA: Insufficient documentation

## 2016-09-04 DIAGNOSIS — Z810 Family history of intellectual disabilities: Secondary | ICD-10-CM | POA: Diagnosis not present

## 2016-09-04 DIAGNOSIS — O99350 Diseases of the nervous system complicating pregnancy, unspecified trimester: Secondary | ICD-10-CM

## 2016-09-04 DIAGNOSIS — Z3A35 35 weeks gestation of pregnancy: Secondary | ICD-10-CM | POA: Diagnosis not present

## 2016-09-04 DIAGNOSIS — O99280 Endocrine, nutritional and metabolic diseases complicating pregnancy, unspecified trimester: Secondary | ICD-10-CM

## 2016-09-04 DIAGNOSIS — Z79899 Other long term (current) drug therapy: Secondary | ICD-10-CM | POA: Insufficient documentation

## 2016-09-04 HISTORY — DX: Encounter for other specified aftercare: Z51.89

## 2016-09-04 LAB — URINALYSIS, ROUTINE W REFLEX MICROSCOPIC
Bilirubin Urine: NEGATIVE
Glucose, UA: 50 mg/dL — AB
Hgb urine dipstick: NEGATIVE
Ketones, ur: NEGATIVE mg/dL
Leukocytes, UA: NEGATIVE
Nitrite: NEGATIVE
Protein, ur: NEGATIVE mg/dL
Specific Gravity, Urine: 1.025 (ref 1.005–1.030)
pH: 5 (ref 5.0–8.0)

## 2016-09-04 NOTE — Discharge Instructions (Signed)
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
## 2016-09-04 NOTE — MAU Note (Signed)
Pt reports cramping and backache. Was seen yesterday but the pain has worsened and she is nauseated. Took tylenol for pain without relief.

## 2016-09-06 ENCOUNTER — Ambulatory Visit: Payer: Self-pay | Admitting: Endocrinology

## 2016-09-07 ENCOUNTER — Other Ambulatory Visit (HOSPITAL_COMMUNITY)
Admission: RE | Admit: 2016-09-07 | Discharge: 2016-09-07 | Disposition: A | Discharge status: Home / Self Care | Payer: Medicare Other | Source: Ambulatory Visit | Attending: Obstetrics and Gynecology | Admitting: Obstetrics and Gynecology

## 2016-09-07 ENCOUNTER — Other Ambulatory Visit (HOSPITAL_COMMUNITY): Payer: Self-pay | Admitting: Obstetrics and Gynecology

## 2016-09-07 ENCOUNTER — Encounter (HOSPITAL_COMMUNITY): Payer: Self-pay

## 2016-09-07 ENCOUNTER — Ambulatory Visit (HOSPITAL_COMMUNITY)
Admission: RE | Admit: 2016-09-07 | Discharge: 2016-09-07 | Disposition: A | Discharge status: Home / Self Care | Payer: Medicare Other | Source: Ambulatory Visit | Attending: Obstetrics & Gynecology | Admitting: Obstetrics & Gynecology

## 2016-09-07 ENCOUNTER — Ambulatory Visit (INDEPENDENT_AMBULATORY_CARE_PROVIDER_SITE_OTHER): Payer: Medicare Other | Admitting: Obstetrics and Gynecology

## 2016-09-07 VITALS — BP 125/73 | HR 97 | Wt 264.5 lb

## 2016-09-07 DIAGNOSIS — O24113 Pre-existing diabetes mellitus, type 2, in pregnancy, third trimester: Secondary | ICD-10-CM

## 2016-09-07 DIAGNOSIS — O09213 Supervision of pregnancy with history of pre-term labor, third trimester: Secondary | ICD-10-CM | POA: Insufficient documentation

## 2016-09-07 DIAGNOSIS — E039 Hypothyroidism, unspecified: Secondary | ICD-10-CM

## 2016-09-07 DIAGNOSIS — G7000 Myasthenia gravis without (acute) exacerbation: Secondary | ICD-10-CM | POA: Diagnosis not present

## 2016-09-07 DIAGNOSIS — Z3A35 35 weeks gestation of pregnancy: Secondary | ICD-10-CM | POA: Diagnosis not present

## 2016-09-07 DIAGNOSIS — O24319 Unspecified pre-existing diabetes mellitus in pregnancy, unspecified trimester: Secondary | ICD-10-CM | POA: Diagnosis not present

## 2016-09-07 DIAGNOSIS — O2693 Pregnancy related conditions, unspecified, third trimester: Secondary | ICD-10-CM | POA: Insufficient documentation

## 2016-09-07 DIAGNOSIS — O0993 Supervision of high risk pregnancy, unspecified, third trimester: Secondary | ICD-10-CM | POA: Diagnosis present

## 2016-09-07 DIAGNOSIS — O99350 Diseases of the nervous system complicating pregnancy, unspecified trimester: Secondary | ICD-10-CM

## 2016-09-07 DIAGNOSIS — O99340 Other mental disorders complicating pregnancy, unspecified trimester: Secondary | ICD-10-CM

## 2016-09-07 DIAGNOSIS — O2690 Pregnancy related conditions, unspecified, unspecified trimester: Secondary | ICD-10-CM

## 2016-09-07 DIAGNOSIS — F329 Major depressive disorder, single episode, unspecified: Secondary | ICD-10-CM

## 2016-09-07 DIAGNOSIS — O09523 Supervision of elderly multigravida, third trimester: Secondary | ICD-10-CM | POA: Insufficient documentation

## 2016-09-07 DIAGNOSIS — F32A Depression, unspecified: Secondary | ICD-10-CM

## 2016-09-07 DIAGNOSIS — O99283 Endocrine, nutritional and metabolic diseases complicating pregnancy, third trimester: Secondary | ICD-10-CM

## 2016-09-07 DIAGNOSIS — O99280 Endocrine, nutritional and metabolic diseases complicating pregnancy, unspecified trimester: Secondary | ICD-10-CM

## 2016-09-07 DIAGNOSIS — O99213 Obesity complicating pregnancy, third trimester: Secondary | ICD-10-CM

## 2016-09-07 DIAGNOSIS — O24119 Pre-existing diabetes mellitus, type 2, in pregnancy, unspecified trimester: Secondary | ICD-10-CM

## 2016-09-07 LAB — POCT URINALYSIS DIP (DEVICE)
Glucose, UA: 250 mg/dL — AB
Hgb urine dipstick: NEGATIVE
Leukocytes, UA: NEGATIVE
Nitrite: NEGATIVE
Protein, ur: NEGATIVE mg/dL
Specific Gravity, Urine: 1.02 (ref 1.005–1.030)
Urobilinogen, UA: 2 mg/dL — ABNORMAL HIGH (ref 0.0–1.0)
pH: 5.5 (ref 5.0–8.0)

## 2016-09-07 NOTE — Progress Notes (Signed)
Subjective:  Jade Mathis is a 41 y.o. [phone] at [redacted]w[redacted]d being seen today for ongoing prenatal care.  She is currently monitored for the following issues for this high-risk pregnancy and has HYPOTHYROIDISM, POSTSURGICAL; Diabetes mellitus type 2 in obese ([place]); Obesity in pregnancy; DEPRESSION, MAJOR, RECURRENT, MODERATE; Migraine; Myasthenia gravis ([place]); Asthma; GERD; OSA (obstructive sleep apnea); Pure hypercholesterolemia; Fibromyalgia; Stool incontinence; Ventral hernia; Preexisting diabetes complicating pregnancy, antepartum; Myasthenia gravis complicating pregnancy ([place]); Depression affecting pregnancy, antepartum; Hypothyroidism affecting pregnancy, antepartum; Advanced maternal age in multigravida; Supervision of high-risk pregnancy; Diabetes mellitus affecting pregnancy, second trimester; and Type 2 diabetes mellitus, uncontrolled ([place]) on her problem list.  Patient reports no complaints.  Contractions: Irregular. Vag. Bleeding: None.  Movement: Present. Denies leaking of fluid.   The following portions of the patient's history were reviewed and updated as appropriate: allergies, current medications, past family history, past medical history, past social history, past surgical history and problem list. Problem list updated.  Objective:   Vitals:   09/07/16 0754  BP: 125/73  Pulse: 97  Weight: 264 lb 8 oz (120 kg)    Fetal Status: Fetal Heart Rate (bpm): NST   Movement: Present     General:  Alert, oriented and cooperative. Patient is in no acute distress.  Skin: Skin is warm and dry. No rash noted.   Cardiovascular: Normal heart rate noted  Respiratory: Normal respiratory effort, no problems with respiration noted  Abdomen: Soft, gravid, appropriate for gestational age. Pain/Pressure: Present     Pelvic:  Cervical exam performed        Extremities: Normal range of motion.     Mental Status: Normal mood and affect. Normal behavior. Normal judgment and thought content.   Urinalysis:       Assessment and Plan:  Pregnancy: [id number] at [redacted]w[redacted]d  1. Preexisting diabetes complicating pregnancy, antepartum Bs better since insulin adjustment but still not all in gaol range Will increase each dose by 2 units RNST today Continue with antenatal testing - Fetal nonstress test  2. Elderly multigravida in third trimester  - Fetal nonstress test  3. Supervision of high risk pregnancy in third trimester  - GC/Chlamydia probe amp ([employer])not at [facility] - Strep Gp B NAA - POCT urinalysis dip (device)  Preterm labor symptoms and general obstetric precautions including but not limited to vaginal bleeding, contractions, leaking of fluid and fetal movement were reviewed in detail with the patient. Please refer to After Visit Summary for other counseling recommendations.  Return in about 1 week (around 09/14/2016) for as scheduled, OB visit.   Chancy Milroy, MD

## 2016-09-07 NOTE — Progress Notes (Signed)
Pt states she had visits to MAU on 6/17 and 6/18 due to cramping. Pt receives weekly BPP @ [facility].  Next [place] for growth scheduled on 7/5.

## 2016-09-08 LAB — GC/CHLAMYDIA PROBE AMP (~~LOC~~) NOT AT ARMC
Chlamydia: NEGATIVE
Neisseria Gonorrhea: NEGATIVE

## 2016-09-08 LAB — CERVICOVAGINAL ANCILLARY ONLY
Chlamydia: NEGATIVE
Neisseria Gonorrhea: NEGATIVE

## 2016-09-09 ENCOUNTER — Other Ambulatory Visit: Payer: Self-pay | Admitting: Family Medicine

## 2016-09-09 ENCOUNTER — Inpatient Hospital Stay (HOSPITAL_COMMUNITY)
Admission: AD | Admit: 2016-09-09 | Discharge: 2016-09-09 | Disposition: A | Discharge status: Home / Self Care | Payer: Medicare Other | Source: Ambulatory Visit | Attending: Obstetrics and Gynecology | Admitting: Obstetrics and Gynecology

## 2016-09-09 ENCOUNTER — Encounter (HOSPITAL_COMMUNITY): Payer: Self-pay | Admitting: *Deleted

## 2016-09-09 DIAGNOSIS — Z3A36 36 weeks gestation of pregnancy: Secondary | ICD-10-CM | POA: Insufficient documentation

## 2016-09-09 DIAGNOSIS — O4703 False labor before 37 completed weeks of gestation, third trimester: Secondary | ICD-10-CM | POA: Insufficient documentation

## 2016-09-09 DIAGNOSIS — O09523 Supervision of elderly multigravida, third trimester: Secondary | ICD-10-CM | POA: Diagnosis not present

## 2016-09-09 DIAGNOSIS — O479 False labor, unspecified: Secondary | ICD-10-CM

## 2016-09-09 DIAGNOSIS — O219 Vomiting of pregnancy, unspecified: Secondary | ICD-10-CM

## 2016-09-09 DIAGNOSIS — O212 Late vomiting of pregnancy: Secondary | ICD-10-CM | POA: Insufficient documentation

## 2016-09-09 DIAGNOSIS — O26893 Other specified pregnancy related conditions, third trimester: Secondary | ICD-10-CM | POA: Diagnosis present

## 2016-09-09 DIAGNOSIS — R102 Pelvic and perineal pain: Secondary | ICD-10-CM | POA: Diagnosis present

## 2016-09-09 LAB — URINALYSIS, ROUTINE W REFLEX MICROSCOPIC
Bilirubin Urine: NEGATIVE
Glucose, UA: 50 mg/dL — AB
Hgb urine dipstick: NEGATIVE
Ketones, ur: NEGATIVE mg/dL
Nitrite: NEGATIVE
Protein, ur: NEGATIVE mg/dL
Specific Gravity, Urine: 1.016 (ref 1.005–1.030)
pH: 6 (ref 5.0–8.0)

## 2016-09-09 LAB — STREP GP B NAA: Strep Gp B NAA: NEGATIVE

## 2016-09-09 MED ORDER — ONDANSETRON 4 MG PO TBDP
4.0000 mg | ORAL_TABLET | Freq: Once | ORAL | Status: AC
  Administered 2016-09-09: 4 mg via ORAL
  Filled 2016-09-09: qty 1

## 2016-09-09 MED ORDER — ONDANSETRON 4 MG PO TBDP: 4.0000 mg | ORAL_TABLET | Freq: Three times a day (TID) | ORAL | 0 refills | Status: DC | PRN

## 2016-09-09 NOTE — MAU Provider Note (Signed)
S:   [name] is a 41 y.o. [id number] @ [redacted]w[redacted]d here in MAU with contractions and nausea. The nausea has been present most of her pregnancy, however has worsened in the last few days. Her contractions are irregular. She denies vaginal bleeding or leaking of water. + fetal movement.    O:  GENERAL: Well-developed, well-nourished female in no acute distress.  LUNGS: Effort normal SKIN: Warm, dry and without erythema PSYCH: Normal mood and affect  Vitals:   09/09/16 0700  BP: 130/78  Pulse: 94  Resp: 17  Temp: 98.1 F (36.7 C)   Fetal Tracing Reactive : Baseline: 135 bpm Variability: Moderate  Accelerations: 15x15 Decelerations: None Toco: Quiet    MDM:  Zofran 4 mg PO   A:  1. Braxton Hicks contractions   2. Nausea/vomiting in pregnancy     P:  Discharge home in stable condition Preterm labor precautions Rx: Zofran Return to MAU if symptoms worsen   Monzerat Handler, Artist Pais, NP 09/09/2016 12:04 PM

## 2016-09-09 NOTE — MAU Note (Signed)
Pt presents to MAU with complaints of lower abdominal cramping since early this morning with Nausea. Pt denies any VB or LOF

## 2016-09-09 NOTE — Discharge Instructions (Signed)
Braxton Hicks Contractions °Contractions of the uterus can occur throughout pregnancy, but they are not always a sign that you are in labor. You may have practice contractions called Braxton Hicks contractions. These false labor contractions are sometimes confused with true labor. °What are Braxton Hicks contractions? °Braxton Hicks contractions are tightening movements that occur in the muscles of the uterus before labor. Unlike true labor contractions, these contractions do not result in opening (dilation) and thinning of the cervix. Toward the end of pregnancy (32-34 weeks), Braxton Hicks contractions can happen more often and may become stronger. These contractions are sometimes difficult to tell apart from true labor because they can be very uncomfortable. You should not feel embarrassed if you go to the hospital with false labor. °Sometimes, the only way to tell if you are in true labor is for your health care provider to look for changes in the cervix. The health care provider will do a physical exam and may monitor your contractions. If you are not in true labor, the exam should show that your cervix is not dilating and your water has not broken. °If there are no prenatal problems or other health problems associated with your pregnancy, it is completely safe for you to be sent home with false labor. You may continue to have Braxton Hicks contractions until you go into true labor. °How can I tell the difference between true labor and false labor? °· Differences °? False labor °? Contractions last 30-70 seconds.: Contractions are usually shorter and not as strong as true labor contractions. °? Contractions become very regular.: Contractions are usually irregular. °? Discomfort is usually felt in the top of the uterus, and it spreads to the lower abdomen and low back.: Contractions are often felt in the front of the lower abdomen and in the groin. °? Contractions do not go away with walking.: Contractions may  go away when you walk around or change positions while lying down. °? Contractions usually become more intense and increase in frequency.: Contractions get weaker and are shorter-lasting as time goes on. °? The cervix dilates and gets thinner.: The cervix usually does not dilate or become thin. °Follow these instructions at home: °· Take over-the-counter and prescription medicines only as told by your health care provider. °· Keep up with your usual exercises and follow other instructions from your health care provider. °· Eat and drink lightly if you think you are going into labor. °· If Braxton Hicks contractions are making you uncomfortable: °? Change your position from lying down or resting to walking, or change from walking to resting. °? Sit and rest in a tub of warm water. °? Drink enough fluid to keep your urine clear or pale yellow. Dehydration may cause these contractions. °? Do slow and deep breathing several times an hour. °· Keep all follow-up prenatal visits as told by your health care provider. This is important. °Contact a health care provider if: °· You have a fever. °· You have continuous pain in your abdomen. °Get help right away if: °· Your contractions become stronger, more regular, and closer together. °· You have fluid leaking or gushing from your vagina. °· You pass blood-tinged mucus (bloody show). °· You have bleeding from your vagina. °· You have low back pain that you never had before. °· You feel your baby’s head pushing down and causing pelvic pressure. °· Your baby is not moving inside you as much as it used to. °Summary °· Contractions that occur before labor are   called Braxton Hicks contractions, false labor, or practice contractions. °· Braxton Hicks contractions are usually shorter, weaker, farther apart, and less regular than true labor contractions. True labor contractions usually become progressively stronger and regular and they become more frequent. °· Manage discomfort from  Braxton Hicks contractions by changing position, resting in a warm bath, drinking plenty of water, or practicing deep breathing. °This information is not intended to replace advice given to you by your health care provider. Make sure you discuss any questions you have with your health care provider. °Document Released: 03/06/2005 Document Revised: 01/24/2016 Document Reviewed: 01/24/2016 °Elsevier Interactive Patient Education © 2017 [employer] ° °

## 2016-09-14 ENCOUNTER — Encounter (HOSPITAL_COMMUNITY): Payer: Self-pay

## 2016-09-14 ENCOUNTER — Ambulatory Visit (INDEPENDENT_AMBULATORY_CARE_PROVIDER_SITE_OTHER): Payer: Medicare Other | Admitting: Obstetrics and Gynecology

## 2016-09-14 ENCOUNTER — Telehealth: Payer: Self-pay | Admitting: General Practice

## 2016-09-14 ENCOUNTER — Ambulatory Visit (HOSPITAL_COMMUNITY)
Admission: RE | Admit: 2016-09-14 | Discharge: 2016-09-14 | Disposition: A | Discharge status: Home / Self Care | Payer: Medicare Other | Source: Ambulatory Visit | Attending: Obstetrics & Gynecology | Admitting: Obstetrics & Gynecology

## 2016-09-14 VITALS — BP 130/77 | HR 100 | Wt 267.0 lb

## 2016-09-14 DIAGNOSIS — O99283 Endocrine, nutritional and metabolic diseases complicating pregnancy, third trimester: Secondary | ICD-10-CM | POA: Insufficient documentation

## 2016-09-14 DIAGNOSIS — O24913 Unspecified diabetes mellitus in pregnancy, third trimester: Secondary | ICD-10-CM | POA: Diagnosis not present

## 2016-09-14 DIAGNOSIS — E039 Hypothyroidism, unspecified: Secondary | ICD-10-CM | POA: Diagnosis not present

## 2016-09-14 DIAGNOSIS — O99280 Endocrine, nutritional and metabolic diseases complicating pregnancy, unspecified trimester: Secondary | ICD-10-CM

## 2016-09-14 DIAGNOSIS — Z3A36 36 weeks gestation of pregnancy: Secondary | ICD-10-CM | POA: Insufficient documentation

## 2016-09-14 DIAGNOSIS — O24912 Unspecified diabetes mellitus in pregnancy, second trimester: Secondary | ICD-10-CM

## 2016-09-14 DIAGNOSIS — O2693 Pregnancy related conditions, unspecified, third trimester: Secondary | ICD-10-CM | POA: Insufficient documentation

## 2016-09-14 DIAGNOSIS — O09523 Supervision of elderly multigravida, third trimester: Secondary | ICD-10-CM | POA: Diagnosis not present

## 2016-09-14 DIAGNOSIS — O24119 Pre-existing diabetes mellitus, type 2, in pregnancy, unspecified trimester: Secondary | ICD-10-CM | POA: Diagnosis present

## 2016-09-14 DIAGNOSIS — O99213 Obesity complicating pregnancy, third trimester: Secondary | ICD-10-CM | POA: Diagnosis not present

## 2016-09-14 DIAGNOSIS — O0993 Supervision of high risk pregnancy, unspecified, third trimester: Secondary | ICD-10-CM

## 2016-09-14 DIAGNOSIS — O24113 Pre-existing diabetes mellitus, type 2, in pregnancy, third trimester: Secondary | ICD-10-CM | POA: Insufficient documentation

## 2016-09-14 NOTE — Progress Notes (Signed)
Subjective:  Jade Mathis is a 41 y.o. [phone] at [redacted]w[redacted]d being seen today for ongoing prenatal care.  She is currently monitored for the following issues for this high-risk pregnancy and has HYPOTHYROIDISM, POSTSURGICAL; Diabetes mellitus type 2 in obese ([place]); Obesity in pregnancy; DEPRESSION, MAJOR, RECURRENT, MODERATE; Migraine; Myasthenia gravis ([place]); Asthma; GERD; OSA (obstructive sleep apnea); Pure hypercholesterolemia; Fibromyalgia; Stool incontinence; Ventral hernia; Preexisting diabetes complicating pregnancy, antepartum; Myasthenia gravis complicating pregnancy ([place]); Depression affecting pregnancy, antepartum; Hypothyroidism affecting pregnancy, antepartum; Advanced maternal age in multigravida; Supervision of high-risk pregnancy; Diabetes mellitus affecting pregnancy, second trimester; and Type 2 diabetes mellitus, uncontrolled ([place]) on her problem list.  Patient reports general discomforts of pregnancy.  Contractions: Irregular. Vag. Bleeding: None.  Movement: Present. Denies leaking of fluid.   The following portions of the patient's history were reviewed and updated as appropriate: allergies, current medications, past family history, past medical history, past social history, past surgical history and problem list. Problem list updated.  Objective:   Vitals:   09/14/16 0806  BP: 130/77  Pulse: 100  Weight: 267 lb (121.1 kg)    Fetal Status: Fetal Heart Rate (bpm): NST   Movement: Present     General:  Alert, oriented and cooperative. Patient is in no acute distress.  Skin: Skin is warm and dry. No rash noted.   Cardiovascular: Normal heart rate noted  Respiratory: Normal respiratory effort, no problems with respiration noted  Abdomen: Soft, gravid, appropriate for gestational age. Pain/Pressure: Present     Pelvic:  Cervical exam performed        Extremities: Normal range of motion.  Edema: Trace  Mental Status: Normal mood and affect. Normal behavior. Normal judgment and  thought content.   Urinalysis:      Assessment and Plan:  Pregnancy: [id number] at [redacted]w[redacted]d  1. Diabetes mellitus affecting pregnancy, second trimester BS improved with adjustment in insulin. Fasting BS in 100's. 2 hr PP in goal range for most part. Will increase bedtime insulin by 2 units RNST today BPP today Continue with antenatal testing IOL at 39 weeks  2. Hypothyroidism affecting pregnancy, antepartum Stable  3. Elderly multigravida in third trimester   4. Supervision of high risk pregnancy in third trimester Labor precautions  Term labor symptoms and general obstetric precautions including but not limited to vaginal bleeding, contractions, leaking of fluid and fetal movement were reviewed in detail with the patient. Please refer to After Visit Summary for other counseling recommendations.  Return in about 1 week (around 09/21/2016) for OB visit.   Chancy Milroy, MD

## 2016-09-14 NOTE — Telephone Encounter (Signed)
Telephone call to patient with IOL date. Patient verbalized understanding & had no questions

## 2016-09-14 NOTE — Patient Instructions (Signed)
Vaginal Delivery Vaginal delivery means that you will give birth by pushing your baby out of your birth canal (vagina). A team of health care providers will help you before, during, and after vaginal delivery. Birth experiences are unique for every woman and every pregnancy, and birth experiences vary depending on where you choose to give birth. What should I do to prepare for my baby's birth? Before your baby is born, it is important to talk with your health care provider about:  Your labor and delivery preferences. These may include: ? Medicines that you may be given. ? How you will manage your pain. This might include non-medical pain relief techniques or injectable pain relief such as epidural analgesia. ? How you and your baby will be monitored during labor and delivery. ? Who may be in the labor and delivery room with you. ? Your feelings about surgical delivery of your baby (cesarean delivery, or C-section) if this becomes necessary. ? Your feelings about receiving donated blood through an IV tube (blood transfusion) if this becomes necessary.  Whether you are able: ? To take pictures or videos of the birth. ? To eat during labor and delivery. ? To move around, walk, or change positions during labor and delivery.  What to expect after your baby is born, such as: ? Whether delayed umbilical cord clamping and cutting is offered. ? Who will care for your baby right after birth. ? Medicines or tests that may be recommended for your baby. ? Whether breastfeeding is supported in your hospital or birth center. ? How long you will be in the hospital or birth center.  How any medical conditions you have may affect your baby or your labor and delivery experience.  To prepare for your baby's birth, you should also:  Attend all of your health care visits before delivery (prenatal visits) as recommended by your health care provider. This is important.  Prepare your home for your baby's  arrival. Make sure that you have: ? Diapers. ? Baby clothing. ? Feeding equipment. ? Safe sleeping arrangements for you and your baby.  Install a car seat in your vehicle. Have your car seat checked by a certified car seat installer to make sure that it is installed safely.  Think about who will help you with your new baby at home for at least the first several weeks after delivery.  What can I expect when I arrive at the birth center or hospital? Once you are in labor and have been admitted into the hospital or birth center, your health care provider may:  Review your pregnancy history and any concerns you have.  Insert an IV tube into one of your veins. This is used to give you fluids and medicines.  Check your blood pressure, pulse, temperature, and heart rate (vital signs).  Check whether your bag of water (amniotic sac) has broken (ruptured).  Talk with you about your birth plan and discuss pain control options.  Monitoring Your health care provider may monitor your contractions (uterine monitoring) and your baby's heart rate (fetal monitoring). You may need to be monitored:  Often, but not continuously (intermittently).  All the time or for long periods at a time (continuously). Continuous monitoring may be needed if: ? You are taking certain medicines, such as medicine to relieve pain or make your contractions stronger. ? You have pregnancy or labor complications.  Monitoring may be done by:  Placing a special stethoscope or a handheld monitoring device on your abdomen to   check your baby's heartbeat, and feeling your abdomen for contractions. This method of monitoring does not continuously record your baby's heartbeat or your contractions.  Placing monitors on your abdomen (external monitors) to record your baby's heartbeat and the frequency and length of contractions. You may not have to wear external monitors all the time.  Placing monitors inside of your uterus  (internal monitors) to record your baby's heartbeat and the frequency, length, and strength of your contractions. ? Your health care provider may use internal monitors if he or she needs more information about the strength of your contractions or your baby's heart rate. ? Internal monitors are put in place by passing a thin, flexible wire through your vagina and into your uterus. Depending on the type of monitor, it may remain in your uterus or on your baby's head until birth. ? Your health care provider will discuss the benefits and risks of internal monitoring with you and will ask for your permission before inserting the monitors.  Telemetry. This is a type of continuous monitoring that can be done with external or internal monitors. Instead of having to stay in bed, you are able to move around during telemetry. Ask your health care provider if telemetry is an option for you.  Physical exam Your health care provider may perform a physical exam. This may include:  Checking whether your baby is positioned: ? With the head toward your vagina (head-down). This is most common. ? With the head toward the top of your uterus (head-up or breech). If your baby is in a breech position, your health care provider may try to turn your baby to a head-down position so you can deliver vaginally. If it does not seem that your baby can be born vaginally, your provider may recommend surgery to deliver your baby. In rare cases, you may be able to deliver vaginally if your baby is head-up (breech delivery). ? Lying sideways (transverse). Babies that are lying sideways cannot be delivered vaginally.  Checking your cervix to determine: ? Whether it is thinning out (effacing). ? Whether it is opening up (dilating). ? How low your baby has moved into your birth canal.  What are the three stages of labor and delivery?  Normal labor and delivery is divided into the following three stages: Stage 1  Stage 1 is the  longest stage of labor, and it can last for hours or days. Stage 1 includes: ? Early labor. This is when contractions may be irregular, or regular and mild. Generally, early labor contractions are more than 10 minutes apart. ? Active labor. This is when contractions get longer, more regular, more frequent, and more intense. ? The transition phase. This is when contractions happen very close together, are very intense, and may last longer than during any other part of labor.  Contractions generally feel mild, infrequent, and irregular at first. They get stronger, more frequent (about every 2-3 minutes), and more regular as you progress from early labor through active labor and transition.  Many women progress through stage 1 naturally, but you may need help to continue making progress. If this happens, your health care provider may talk with you about: ? Rupturing your amniotic sac if it has not ruptured yet. ? Giving you medicine to help make your contractions stronger and more frequent.  Stage 1 ends when your cervix is completely dilated to 4 inches (10 cm) and completely effaced. This happens at the end of the transition phase. Stage 2  Once   your cervix is completely effaced and dilated to 4 inches (10 cm), you may start to feel an urge to push. It is common for the body to naturally take a rest before feeling the urge to push, especially if you received an epidural or certain other pain medicines. This rest period may last for up to 1-2 hours, depending on your unique labor experience.  During stage 2, contractions are generally less painful, because pushing helps relieve contraction pain. Instead of contraction pain, you may feel stretching and burning pain, especially when the widest part of your baby's head passes through the vaginal opening (crowning).  Your health care provider will closely monitor your pushing progress and your baby's progress through the vagina during stage 2.  Your  health care provider may massage the area of skin between your vaginal opening and anus (perineum) or apply warm compresses to your perineum. This helps it stretch as the baby's head starts to crown, which can help prevent perineal tearing. ? In some cases, an incision may be made in your perineum (episiotomy) to allow the baby to pass through the vaginal opening. An episiotomy helps to make the opening of the vagina larger to allow more room for the baby to fit through.  It is very important to breathe and focus so your health care provider can control the delivery of your baby's head. Your health care provider may have you decrease the intensity of your pushing, to help prevent perineal tearing.  After delivery of your baby's head, the shoulders and the rest of the body generally deliver very quickly and without difficulty.  Once your baby is delivered, the umbilical cord may be cut right away, or this may be delayed for 1-2 minutes, depending on your baby's health. This may vary among health care providers, hospitals, and birth centers.  If you and your baby are healthy enough, your baby may be placed on your chest or abdomen to help maintain the baby's temperature and to help you bond with each other. Some mothers and babies start breastfeeding at this time. Your health care team will dry your baby and help keep your baby warm during this time.  Your baby may need immediate care if he or she: ? Showed signs of distress during labor. ? Has a medical condition. ? Was born too early (prematurely). ? Had a bowel movement before birth (meconium). ? Shows signs of difficulty transitioning from being inside the uterus to being outside of the uterus. If you are planning to breastfeed, your health care team will help you begin a feeding. Stage 3  The third stage of labor starts immediately after the birth of your baby and ends after you deliver the placenta. The placenta is an organ that develops  during pregnancy to provide oxygen and nutrients to your baby in the womb.  Delivering the placenta may require some pushing, and you may have mild contractions. Breastfeeding can stimulate contractions to help you deliver the placenta.  After the placenta is delivered, your uterus should tighten (contract) and become firm. This helps to stop bleeding in your uterus. To help your uterus contract and to control bleeding, your health care provider may: ? Give you medicine by injection, through an IV tube, by mouth, or through your rectum (rectally). ? Massage your abdomen or perform a vaginal exam to remove any blood clots that are left in your uterus. ? Empty your bladder by placing a thin, flexible tube (catheter) into your bladder. ? Encourage   you to breastfeed your baby. After labor is over, you and your baby will be monitored closely to ensure that you are both healthy until you are ready to go home. Your health care team will teach you how to care for yourself and your baby. This information is not intended to replace advice given to you by your health care provider. Make sure you discuss any questions you have with your health care provider. Document Released: 12/14/2007 Document Revised: 09/24/2015 Document Reviewed: 03/21/2015 Elsevier Interactive Patient Education  2018 [employer]  

## 2016-09-15 ENCOUNTER — Telehealth (HOSPITAL_COMMUNITY): Payer: Self-pay | Admitting: *Deleted

## 2016-09-15 ENCOUNTER — Encounter (HOSPITAL_COMMUNITY): Payer: Self-pay | Admitting: *Deleted

## 2016-09-15 NOTE — Telephone Encounter (Signed)
Preadmission screen  

## 2016-09-17 ENCOUNTER — Encounter (HOSPITAL_COMMUNITY): Payer: Self-pay

## 2016-09-17 ENCOUNTER — Inpatient Hospital Stay (HOSPITAL_COMMUNITY)
Admission: AD | Admit: 2016-09-17 | Discharge: 2016-09-17 | Disposition: A | Discharge status: Home / Self Care | Payer: Medicare Other | Source: Ambulatory Visit | Attending: Obstetrics & Gynecology | Admitting: Obstetrics & Gynecology

## 2016-09-17 DIAGNOSIS — G7000 Myasthenia gravis without (acute) exacerbation: Secondary | ICD-10-CM

## 2016-09-17 DIAGNOSIS — Z3A37 37 weeks gestation of pregnancy: Secondary | ICD-10-CM | POA: Insufficient documentation

## 2016-09-17 DIAGNOSIS — O24319 Unspecified pre-existing diabetes mellitus in pregnancy, unspecified trimester: Secondary | ICD-10-CM

## 2016-09-17 DIAGNOSIS — O479 False labor, unspecified: Secondary | ICD-10-CM

## 2016-09-17 DIAGNOSIS — E039 Hypothyroidism, unspecified: Secondary | ICD-10-CM

## 2016-09-17 DIAGNOSIS — O99350 Diseases of the nervous system complicating pregnancy, unspecified trimester: Secondary | ICD-10-CM

## 2016-09-17 DIAGNOSIS — O99280 Endocrine, nutritional and metabolic diseases complicating pregnancy, unspecified trimester: Secondary | ICD-10-CM

## 2016-09-17 DIAGNOSIS — O99340 Other mental disorders complicating pregnancy, unspecified trimester: Secondary | ICD-10-CM

## 2016-09-17 DIAGNOSIS — O368130 Decreased fetal movements, third trimester, not applicable or unspecified: Secondary | ICD-10-CM | POA: Insufficient documentation

## 2016-09-17 DIAGNOSIS — F329 Major depressive disorder, single episode, unspecified: Secondary | ICD-10-CM

## 2016-09-17 DIAGNOSIS — O471 False labor at or after 37 completed weeks of gestation: Secondary | ICD-10-CM | POA: Diagnosis not present

## 2016-09-17 DIAGNOSIS — F32A Depression, unspecified: Secondary | ICD-10-CM

## 2016-09-17 HISTORY — DX: Myasthenia gravis without (acute) exacerbation: G70.00

## 2016-09-17 NOTE — Discharge Instructions (Signed)
Braxton Hicks Contractions °Contractions of the uterus can occur throughout pregnancy, but they are not always a sign that you are in labor. You may have practice contractions called Braxton Hicks contractions. These false labor contractions are sometimes confused with true labor. °What are Braxton Hicks contractions? °Braxton Hicks contractions are tightening movements that occur in the muscles of the uterus before labor. Unlike true labor contractions, these contractions do not result in opening (dilation) and thinning of the cervix. Toward the end of pregnancy (32-34 weeks), Braxton Hicks contractions can happen more often and may become stronger. These contractions are sometimes difficult to tell apart from true labor because they can be very uncomfortable. You should not feel embarrassed if you go to the hospital with false labor. °Sometimes, the only way to tell if you are in true labor is for your health care provider to look for changes in the cervix. The health care provider will do a physical exam and may monitor your contractions. If you are not in true labor, the exam should show that your cervix is not dilating and your water has not broken. °If there are no prenatal problems or other health problems associated with your pregnancy, it is completely safe for you to be sent home with false labor. You may continue to have Braxton Hicks contractions until you go into true labor. °How can I tell the difference between true labor and false labor? °· Differences °? False labor °? Contractions last 30-70 seconds.: Contractions are usually shorter and not as strong as true labor contractions. °? Contractions become very regular.: Contractions are usually irregular. °? Discomfort is usually felt in the top of the uterus, and it spreads to the lower abdomen and low back.: Contractions are often felt in the front of the lower abdomen and in the groin. °? Contractions do not go away with walking.: Contractions may  go away when you walk around or change positions while lying down. °? Contractions usually become more intense and increase in frequency.: Contractions get weaker and are shorter-lasting as time goes on. °? The cervix dilates and gets thinner.: The cervix usually does not dilate or become thin. °Follow these instructions at home: °· Take over-the-counter and prescription medicines only as told by your health care provider. °· Keep up with your usual exercises and follow other instructions from your health care provider. °· Eat and drink lightly if you think you are going into labor. °· If Braxton Hicks contractions are making you uncomfortable: °? Change your position from lying down or resting to walking, or change from walking to resting. °? Sit and rest in a tub of warm water. °? Drink enough fluid to keep your urine clear or pale yellow. Dehydration may cause these contractions. °? Do slow and deep breathing several times an hour. °· Keep all follow-up prenatal visits as told by your health care provider. This is important. °Contact a health care provider if: °· You have a fever. °· You have continuous pain in your abdomen. °Get help right away if: °· Your contractions become stronger, more regular, and closer together. °· You have fluid leaking or gushing from your vagina. °· You pass blood-tinged mucus (bloody show). °· You have bleeding from your vagina. °· You have low back pain that you never had before. °· You feel your baby’s head pushing down and causing pelvic pressure. °· Your baby is not moving inside you as much as it used to. °Summary °· Contractions that occur before labor are   called Braxton Hicks contractions, false labor, or practice contractions. °· Braxton Hicks contractions are usually shorter, weaker, farther apart, and less regular than true labor contractions. True labor contractions usually become progressively stronger and regular and they become more frequent. °· Manage discomfort from  Braxton Hicks contractions by changing position, resting in a warm bath, drinking plenty of water, or practicing deep breathing. °This information is not intended to replace advice given to you by your health care provider. Make sure you discuss any questions you have with your health care provider. °Document Released: 03/06/2005 Document Revised: 01/24/2016 Document Reviewed: 01/24/2016 °Elsevier Interactive Patient Education © 2017 [employer] ° °

## 2016-09-17 NOTE — MAU Provider Note (Signed)
Chief Complaint  Patient presents with  . Decreased Fetal Movement  . Contractions     First Provider Initiated Contact with Patient 09/17/16 1801      S: Jade Mathis  is a 41 y.o. y.o. year old [phone] female at [redacted]w[redacted]d weeks gestation who presents to MAU reporting contractions and decreased fetal movement.   Contractions: Irreg Vaginal bleeding: None Leaking of fluid: None  Patient Active Problem List   Diagnosis Date Noted  . Type 2 diabetes mellitus, uncontrolled ([place]) 07/12/2016  . Diabetes mellitus affecting pregnancy, second trimester 06/13/2016  . Advanced maternal age in multigravida 03/21/2016  . Supervision of high-risk pregnancy 03/21/2016  . Preexisting diabetes complicating pregnancy, antepartum 03/06/2016  . Myasthenia gravis complicating pregnancy ([place]) 03/06/2016  . Depression affecting pregnancy, antepartum 03/06/2016  . Hypothyroidism affecting pregnancy, antepartum 03/06/2016  . Ventral hernia 05/02/2015  . Stool incontinence 07/18/2013  . Fibromyalgia 06/22/2013  . Pure hypercholesterolemia 11/02/2012  . OSA (obstructive sleep apnea) 01/26/2012  . GERD 03/03/2009  . DEPRESSION, MAJOR, RECURRENT, MODERATE 10/16/2008  . Migraine 08/04/2008  . HYPOTHYROIDISM, 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/31/2006  . Diabetes mellitus type 2 in obese ([place]) 05/17/2006  . Obesity in pregnancy 05/17/2006  . Myasthenia gravis ([place]) 05/17/2006  . Asthma 05/17/2006    O: Patient Vitals for the past 24 hrs:  BP Temp Temp src Pulse Resp Height Weight  09/17/16 1706 133/66 97.8 F (36.6 C) Oral (!) 107 18 5\' 6"  (1.676 m) 264 lb (119.7 kg)   General: NAD Heart: Regular rate Lungs: Normal rate and effort Abd: Soft, NT, Gravid, S=D Pelvic: NEFG, no blood.  Dilation: 2 Effacement (%): 50 Cervical Position: Posterior Station: -3 Exam by:: N Druebbisch RN  NST performed EFM: 150, mod variability, 15x15 accels, no decels. Fetal mvmt audible Toco: Irreg, mild-mod  A: [redacted]w[redacted]d week  IUP Braxton Hicks Decreased fetal movement resolved with reactive NST.  P: Discharge home in stable condition. Labor precautions and fetal kick counts. Follow-up as scheduled for prenatal visit or sooner as needed if symptoms worsen. Return to maternity admissions as needed if symptoms worsen.  [place], [place], [place] 09/17/2016 6:01 PM  2

## 2016-09-17 NOTE — MAU Note (Signed)
Urine sent to lab 

## 2016-09-17 NOTE — MAU Note (Signed)
RN brought patient back in MAU to room 6 at 1650, obtained fetal heart rate at 1651- 140bpm.

## 2016-09-17 NOTE — MAU Note (Signed)
Pt reports contractions, back ache, and decreased fetal movement. Contractions started this morning around 1030. Cervix was checked in the office on Thursday and was 2cm.

## 2016-09-19 DIAGNOSIS — G4733 Obstructive sleep apnea (adult) (pediatric): Secondary | ICD-10-CM | POA: Diagnosis not present

## 2016-09-21 ENCOUNTER — Other Ambulatory Visit (HOSPITAL_COMMUNITY): Payer: Self-pay | Admitting: Maternal and Fetal Medicine

## 2016-09-21 ENCOUNTER — Ambulatory Visit (HOSPITAL_COMMUNITY)
Admission: RE | Admit: 2016-09-21 | Discharge: 2016-09-21 | Disposition: A | Discharge status: Home / Self Care | Payer: Medicare Other | Source: Ambulatory Visit | Attending: Obstetrics & Gynecology | Admitting: Obstetrics & Gynecology

## 2016-09-21 ENCOUNTER — Encounter (HOSPITAL_COMMUNITY): Payer: Self-pay

## 2016-09-21 ENCOUNTER — Other Ambulatory Visit (HOSPITAL_COMMUNITY): Payer: Self-pay | Admitting: Obstetrics and Gynecology

## 2016-09-21 ENCOUNTER — Ambulatory Visit (INDEPENDENT_AMBULATORY_CARE_PROVIDER_SITE_OTHER): Payer: Medicare Other | Admitting: Family Medicine

## 2016-09-21 VITALS — BP 128/73 | HR 95 | Wt 270.9 lb

## 2016-09-21 DIAGNOSIS — O24313 Unspecified pre-existing diabetes mellitus in pregnancy, third trimester: Secondary | ICD-10-CM

## 2016-09-21 DIAGNOSIS — E1169 Type 2 diabetes mellitus with other specified complication: Secondary | ICD-10-CM

## 2016-09-21 DIAGNOSIS — O09523 Supervision of elderly multigravida, third trimester: Secondary | ICD-10-CM

## 2016-09-21 DIAGNOSIS — O99283 Endocrine, nutritional and metabolic diseases complicating pregnancy, third trimester: Secondary | ICD-10-CM

## 2016-09-21 DIAGNOSIS — O24119 Pre-existing diabetes mellitus, type 2, in pregnancy, unspecified trimester: Secondary | ICD-10-CM

## 2016-09-21 DIAGNOSIS — O24113 Pre-existing diabetes mellitus, type 2, in pregnancy, third trimester: Secondary | ICD-10-CM

## 2016-09-21 DIAGNOSIS — E669 Obesity, unspecified: Secondary | ICD-10-CM

## 2016-09-21 DIAGNOSIS — O2690 Pregnancy related conditions, unspecified, unspecified trimester: Secondary | ICD-10-CM

## 2016-09-21 DIAGNOSIS — O24319 Unspecified pre-existing diabetes mellitus in pregnancy, unspecified trimester: Secondary | ICD-10-CM

## 2016-09-21 DIAGNOSIS — Z3A37 37 weeks gestation of pregnancy: Secondary | ICD-10-CM

## 2016-09-21 DIAGNOSIS — E039 Hypothyroidism, unspecified: Secondary | ICD-10-CM

## 2016-09-21 DIAGNOSIS — Z794 Long term (current) use of insulin: Secondary | ICD-10-CM

## 2016-09-21 DIAGNOSIS — O99213 Obesity complicating pregnancy, third trimester: Secondary | ICD-10-CM

## 2016-09-21 DIAGNOSIS — O0993 Supervision of high risk pregnancy, unspecified, third trimester: Secondary | ICD-10-CM

## 2016-09-21 DIAGNOSIS — O99280 Endocrine, nutritional and metabolic diseases complicating pregnancy, unspecified trimester: Secondary | ICD-10-CM

## 2016-09-21 LAB — POCT URINALYSIS DIP (DEVICE)
Bilirubin Urine: NEGATIVE
Glucose, UA: 100 mg/dL — AB
Hgb urine dipstick: NEGATIVE
Ketones, ur: NEGATIVE mg/dL
Leukocytes, UA: NEGATIVE
Nitrite: NEGATIVE
Protein, ur: NEGATIVE mg/dL
Specific Gravity, Urine: 1.025 (ref 1.005–1.030)
Urobilinogen, UA: 1 mg/dL (ref 0.0–1.0)
pH: 5.5 (ref 5.0–8.0)

## 2016-09-21 MED ORDER — HUMALOG KWIKPEN 100 UNIT/ML ~~LOC~~ SOPN: PEN_INJECTOR | SUBCUTANEOUS | 5 refills | Status: DC

## 2016-09-21 NOTE — Patient Instructions (Signed)
Humalog (before meals) 22 units before Breakfast 36 units before Lunch 36 units before [employer]

## 2016-09-21 NOTE — Addendum Note (Signed)
Addended by: Michel Harrow on: 09/21/2016 05:10 PM   Modules accepted: Orders

## 2016-09-21 NOTE — Progress Notes (Signed)
Subjective:  Jade Mathis is a 41 y.o. [phone] at [redacted]w[redacted]d being seen today for ongoing prenatal care.  She is currently monitored for the following issues for this high-risk pregnancy and has HYPOTHYROIDISM, POSTSURGICAL; Diabetes mellitus type 2 in obese ([place]); Obesity in pregnancy; DEPRESSION, MAJOR, RECURRENT, MODERATE; Migraine; Myasthenia gravis ([place]); Asthma; GERD; OSA (obstructive sleep apnea); Pure hypercholesterolemia; Fibromyalgia; Stool incontinence; Ventral hernia; Preexisting diabetes complicating pregnancy, antepartum; Myasthenia gravis complicating pregnancy ([place]); Depression affecting pregnancy, antepartum; Hypothyroidism affecting pregnancy, antepartum; Advanced maternal age in multigravida; Supervision of high-risk pregnancy; Diabetes mellitus affecting pregnancy, second trimester; and Type 2 diabetes mellitus, uncontrolled ([place]) on her problem list.  GDM: Patient taking insulin and metformin.  Reports no hypoglycemic episodes.  Tolerating medication well Fasting: 90-100 2hr PP: 140-160s, worse after dinner and lunch  Patient reports legs cramping.  Contractions: Irregular. Vag. Bleeding: None.  Movement: Present. Denies leaking of fluid.   The following portions of the patient's history were reviewed and updated as appropriate: allergies, current medications, past family history, past medical history, past social history, past surgical history and problem list. Problem list updated.  Objective:   Vitals:   09/21/16 1005  BP: 128/73  Pulse: 95  Weight: 270 lb 14.4 oz (122.9 kg)    Fetal Status: Fetal Heart Rate (bpm): NST   Movement: Present  Presentation: Vertex  General:  Alert, oriented and cooperative. Patient is in no acute distress.  Skin: Skin is warm and dry. No rash noted.   Cardiovascular: Normal heart rate noted  Respiratory: Normal respiratory effort, no problems with respiration noted  Abdomen: Soft, gravid, appropriate for gestational age. Pain/Pressure:  Present     Pelvic: Vag. Bleeding: None Vag D/C Character: White   Cervical exam performed Dilation: 2.5 Effacement (%): 50 Station: Ballotable  Extremities: Normal range of motion.  Edema: Mild pitting, slight indentation  Mental Status: Normal mood and affect. Normal behavior. Normal judgment and thought content.   Urinalysis:      Assessment and Plan:  Pregnancy: [id number] at [redacted]w[redacted]d  1. Elderly multigravida in third trimester nst reactive - Fetal nonstress test  2. Preexisting diabetes complicating pregnancy, antepartum Increase humalog to 22/36/36. Continue levemir, NPH, metformin Induction scheduled for 39 weeks Continue antenatal testing. - Fetal nonstress test - HUMALOG KWIKPEN 100 UNIT/ML KiwkPen; 22 units AC Breakfast/36 AC Lunch/36 AC Dinner  Dispense: 30 mL; Refill: 5  3. Supervision of high risk pregnancy in third trimester   4. Hypothyroidism affecting pregnancy, antepartum  5. Diabetes mellitus type 2 in obese [facility])   Term labor symptoms and general obstetric precautions including but not limited to vaginal bleeding, contractions, leaking of fluid and fetal movement were reviewed in detail with the patient. Please refer to After Visit Summary for other counseling recommendations.  Return in about 1 week (around 09/28/2016) for OB f/u, NST.   Truett Mainland, DO

## 2016-09-21 NOTE — Progress Notes (Signed)
[place] for growth and BPP done today, next BPP scheduled on 7/12.   IOL scheduled 7/13 @ midnight.

## 2016-09-22 ENCOUNTER — Inpatient Hospital Stay (HOSPITAL_COMMUNITY)
Admission: AD | Admit: 2016-09-22 | Discharge: 2016-09-25 | DRG: 774 | Disposition: A | Discharge status: Home / Self Care | Payer: Medicare Other | Source: Ambulatory Visit | Attending: Obstetrics & Gynecology | Admitting: Obstetrics & Gynecology

## 2016-09-22 DIAGNOSIS — O99214 Obesity complicating childbirth: Secondary | ICD-10-CM | POA: Diagnosis present

## 2016-09-22 DIAGNOSIS — O99284 Endocrine, nutritional and metabolic diseases complicating childbirth: Secondary | ICD-10-CM | POA: Diagnosis present

## 2016-09-22 DIAGNOSIS — O99280 Endocrine, nutritional and metabolic diseases complicating pregnancy, unspecified trimester: Secondary | ICD-10-CM

## 2016-09-22 DIAGNOSIS — E890 Postprocedural hypothyroidism: Secondary | ICD-10-CM | POA: Diagnosis present

## 2016-09-22 DIAGNOSIS — G7000 Myasthenia gravis without (acute) exacerbation: Secondary | ICD-10-CM

## 2016-09-22 DIAGNOSIS — Z3A38 38 weeks gestation of pregnancy: Secondary | ICD-10-CM

## 2016-09-22 DIAGNOSIS — O9962 Diseases of the digestive system complicating childbirth: Secondary | ICD-10-CM | POA: Diagnosis present

## 2016-09-22 DIAGNOSIS — O99350 Diseases of the nervous system complicating pregnancy, unspecified trimester: Secondary | ICD-10-CM

## 2016-09-22 DIAGNOSIS — O411230 Chorioamnionitis, third trimester, not applicable or unspecified: Secondary | ICD-10-CM | POA: Diagnosis present

## 2016-09-22 DIAGNOSIS — F32A Depression, unspecified: Secondary | ICD-10-CM

## 2016-09-22 DIAGNOSIS — O24319 Unspecified pre-existing diabetes mellitus in pregnancy, unspecified trimester: Secondary | ICD-10-CM

## 2016-09-22 DIAGNOSIS — O9952 Diseases of the respiratory system complicating childbirth: Secondary | ICD-10-CM | POA: Diagnosis present

## 2016-09-22 DIAGNOSIS — O2412 Pre-existing diabetes mellitus, type 2, in childbirth: Principal | ICD-10-CM | POA: Diagnosis present

## 2016-09-22 DIAGNOSIS — J45909 Unspecified asthma, uncomplicated: Secondary | ICD-10-CM | POA: Diagnosis present

## 2016-09-22 DIAGNOSIS — E119 Type 2 diabetes mellitus without complications: Secondary | ICD-10-CM | POA: Diagnosis present

## 2016-09-22 DIAGNOSIS — E039 Hypothyroidism, unspecified: Secondary | ICD-10-CM

## 2016-09-22 DIAGNOSIS — K219 Gastro-esophageal reflux disease without esophagitis: Secondary | ICD-10-CM | POA: Diagnosis not present

## 2016-09-22 DIAGNOSIS — Z6841 Body Mass Index (BMI) 40.0 and over, adult: Secondary | ICD-10-CM | POA: Diagnosis not present

## 2016-09-22 DIAGNOSIS — O99340 Other mental disorders complicating pregnancy, unspecified trimester: Secondary | ICD-10-CM

## 2016-09-22 DIAGNOSIS — F329 Major depressive disorder, single episode, unspecified: Secondary | ICD-10-CM

## 2016-09-22 DIAGNOSIS — Z794 Long term (current) use of insulin: Secondary | ICD-10-CM

## 2016-09-22 DIAGNOSIS — F331 Major depressive disorder, recurrent, moderate: Secondary | ICD-10-CM

## 2016-09-22 DIAGNOSIS — O411290 Chorioamnionitis, unspecified trimester, not applicable or unspecified: Secondary | ICD-10-CM

## 2016-09-22 LAB — CBC
HCT: 34.6 % — ABNORMAL LOW (ref 36.0–46.0)
Hemoglobin: 11.1 g/dL — ABNORMAL LOW (ref 12.0–15.0)
MCH: 26.4 pg (ref 26.0–34.0)
MCHC: 32.1 g/dL (ref 30.0–36.0)
MCV: 82.4 fL (ref 78.0–100.0)
Platelets: 249 10*3/uL (ref 150–400)
RBC: 4.2 MIL/uL (ref 3.87–5.11)
RDW: 14.9 % (ref 11.5–15.5)
WBC: 6.7 10*3/uL (ref 4.0–10.5)

## 2016-09-22 LAB — GLUCOSE, CAPILLARY
Glucose-Capillary: 92 mg/dL (ref 65–99)
Glucose-Capillary: 73 mg/dL (ref 65–99)
Glucose-Capillary: 199 mg/dL — ABNORMAL HIGH (ref 65–99)
Glucose-Capillary: 108 mg/dL — ABNORMAL HIGH (ref 65–99)
Glucose-Capillary: 150 mg/dL — ABNORMAL HIGH (ref 65–99)
Glucose-Capillary: 100 mg/dL — ABNORMAL HIGH (ref 65–99)
Glucose-Capillary: 160 mg/dL — ABNORMAL HIGH (ref 65–99)

## 2016-09-22 LAB — POCT FERN TEST

## 2016-09-22 MED ORDER — ACETAMINOPHEN 325 MG PO TABS
650.0000 mg | ORAL_TABLET | ORAL | Status: DC | PRN
  Administered 2016-09-23: 650 mg via ORAL
  Filled 2016-09-22: qty 2

## 2016-09-22 MED ORDER — LIDOCAINE HCL (PF) 1 % IJ SOLN
30.0000 mL | INTRAMUSCULAR | Status: DC | PRN
  Filled 2016-09-22: qty 30

## 2016-09-22 MED ORDER — PHENYLEPHRINE 40 MCG/ML (10ML) SYRINGE FOR IV PUSH (FOR BLOOD PRESSURE SUPPORT)
80.0000 ug | PREFILLED_SYRINGE | INTRAVENOUS | Status: DC | PRN
  Administered 2016-09-23: 80 ug via INTRAVENOUS
  Filled 2016-09-22: qty 10
  Filled 2016-09-22: qty 5

## 2016-09-22 MED ORDER — OXYTOCIN 40 UNITS IN LACTATED RINGERS INFUSION - SIMPLE MED: 2.5000 [IU]/h | INTRAVENOUS | Status: DC

## 2016-09-22 MED ORDER — SOD CITRATE-CITRIC ACID 500-334 MG/5ML PO SOLN: 30.0000 mL | ORAL | Status: DC | PRN

## 2016-09-22 MED ORDER — FLEET ENEMA 7-19 GM/118ML RE ENEM
1.0000 | ENEMA | RECTAL | Status: DC | PRN
Start: 2016-09-22 — End: 2016-09-23

## 2016-09-22 MED ORDER — EPHEDRINE 5 MG/ML INJ
10.0000 mg | INTRAVENOUS | Status: DC | PRN
  Filled 2016-09-22: qty 2

## 2016-09-22 MED ORDER — FENTANYL CITRATE (PF) 100 MCG/2ML IJ SOLN
100.0000 ug | INTRAMUSCULAR | Status: DC | PRN
  Filled 2016-09-22: qty 2

## 2016-09-22 MED ORDER — OXYTOCIN BOLUS FROM INFUSION
500.0000 mL | Freq: Once | INTRAVENOUS | Status: AC
  Administered 2016-09-23: 500 mL via INTRAVENOUS

## 2016-09-22 MED ORDER — FENTANYL 2.5 MCG/ML BUPIVACAINE 1/10 % EPIDURAL INFUSION (WH - ANES)
14.0000 mL/h | INTRAMUSCULAR | Status: DC | PRN
  Administered 2016-09-23: 14 mL/h via EPIDURAL
  Filled 2016-09-22: qty 100

## 2016-09-22 MED ORDER — ONDANSETRON HCL 4 MG/2ML IJ SOLN
4.0000 mg | Freq: Four times a day (QID) | INTRAMUSCULAR | Status: DC | PRN
  Administered 2016-09-23: 4 mg via INTRAVENOUS
  Filled 2016-09-22: qty 2

## 2016-09-22 MED ORDER — DIPHENHYDRAMINE HCL 50 MG/ML IJ SOLN: 12.5000 mg | INTRAMUSCULAR | Status: DC | PRN

## 2016-09-22 MED ORDER — LACTATED RINGERS IV SOLN
500.0000 mL | INTRAVENOUS | Status: DC | PRN
  Administered 2016-09-23: 500 mL via INTRAVENOUS

## 2016-09-22 MED ORDER — LACTATED RINGERS IV SOLN
500.0000 mL | Freq: Once | INTRAVENOUS | Status: AC
  Administered 2016-09-23: 500 mL via INTRAVENOUS

## 2016-09-22 MED ORDER — OXYCODONE-ACETAMINOPHEN 5-325 MG PO TABS: 1.0000 | ORAL_TABLET | ORAL | Status: DC | PRN

## 2016-09-22 MED ORDER — OXYCODONE-ACETAMINOPHEN 5-325 MG PO TABS: 2.0000 | ORAL_TABLET | ORAL | Status: DC | PRN

## 2016-09-22 MED ORDER — PHENYLEPHRINE 40 MCG/ML (10ML) SYRINGE FOR IV PUSH (FOR BLOOD PRESSURE SUPPORT)
80.0000 ug | PREFILLED_SYRINGE | INTRAVENOUS | Status: DC | PRN
  Administered 2016-09-23: 80 ug via INTRAVENOUS
  Filled 2016-09-22: qty 5

## 2016-09-22 MED ORDER — LACTATED RINGERS IV SOLN
INTRAVENOUS | Status: DC
  Administered 2016-09-22 – 2016-09-23 (×3): via INTRAVENOUS

## 2016-09-22 MED ORDER — EPHEDRINE 5 MG/ML INJ
10.0000 mg | INTRAVENOUS | Status: DC | PRN
Start: 2016-09-22 — End: 2016-09-23
  Filled 2016-09-22: qty 2

## 2016-09-22 NOTE — Anesthesia Pain Management Evaluation Note (Signed)
  CRNA Pain Management Visit Note  Patient: Jade Mathis, 41 y.o., female  "Hello I am a member of the anesthesia team at [facility]. We have an anesthesia team available at all times to provide care throughout the hospital, including epidural management and anesthesia for C-section. I don't know your plan for the delivery whether it a natural birth, water birth, IV sedation, nitrous supplementation, doula or epidural, but we want to meet your pain goals."   1.Was your pain managed to your expectations on prior hospitalizations?   Yes   2.What is your expectation for pain management during this hospitalization?     Epidural  3.How can we help you reach that goal? epidural  Record the patient's initial score and the patient's pain goal.   Pain: 7  Pain Goal: 7 [facility] wants you to be able to say your pain was always managed very well.  Jade Mathis 09/22/2016

## 2016-09-22 NOTE — Progress Notes (Signed)
S: Patient seen & examined for progress of labor. Currently endorses feeling contractions but she is able to talk through them. Foley bulb still in place.  Dilation: 2 Effacement (%): 50 Station: -3 Presentation: Vertex Exam by:: Evette Doffing   FHT: 150 bpm, mod var, +accels, no decels TOCO: occasional/irregular   A/P: Labor: FBin place, wait until out and consider pitocin Pain: planning for epidural, currently pain well-controlled FWB: Category I   Continue expectant management Anticipate SVD  Everrett Coombe, MD PGY-2 [facility]

## 2016-09-22 NOTE — MAU Note (Signed)
Patient presents by EMS SROM about 1 hour ago, intermittent contractions.
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
## 2016-09-23 ENCOUNTER — Encounter (HOSPITAL_COMMUNITY): Payer: Self-pay | Admitting: *Deleted

## 2016-09-23 ENCOUNTER — Inpatient Hospital Stay (HOSPITAL_COMMUNITY): Payer: Medicare Other | Admitting: Anesthesiology

## 2016-09-23 DIAGNOSIS — Z794 Long term (current) use of insulin: Secondary | ICD-10-CM

## 2016-09-23 DIAGNOSIS — O411290 Chorioamnionitis, unspecified trimester, not applicable or unspecified: Secondary | ICD-10-CM

## 2016-09-23 DIAGNOSIS — E119 Type 2 diabetes mellitus without complications: Secondary | ICD-10-CM

## 2016-09-23 DIAGNOSIS — O2412 Pre-existing diabetes mellitus, type 2, in childbirth: Secondary | ICD-10-CM

## 2016-09-23 DIAGNOSIS — Z3A38 38 weeks gestation of pregnancy: Secondary | ICD-10-CM

## 2016-09-23 LAB — COMPREHENSIVE METABOLIC PANEL
ALT: 15 U/L (ref 14–54)
AST: 23 U/L (ref 15–41)
Albumin: 2.5 g/dL — ABNORMAL LOW (ref 3.5–5.0)
Alkaline Phosphatase: 120 U/L (ref 38–126)
Anion gap: 7 (ref 5–15)
BUN: 10 mg/dL (ref 6–20)
CO2: 23 mmol/L (ref 22–32)
Calcium: 8.3 mg/dL — ABNORMAL LOW (ref 8.9–10.3)
Chloride: 104 mmol/L (ref 101–111)
Creatinine, Ser: 0.71 mg/dL (ref 0.44–1.00)
GFR calc Af Amer: >60 mL/min (ref >60)
GFR calc non Af Amer: >60 mL/min (ref >60)
Glucose, Bld: 152 mg/dL — ABNORMAL HIGH (ref 65–99)
Potassium: 4.4 mmol/L (ref 3.5–5.1)
Sodium: 134 mmol/L — ABNORMAL LOW (ref 135–145)
Total Bilirubin: 0.5 mg/dL (ref 0.3–1.2)
Total Protein: 6.4 g/dL — ABNORMAL LOW (ref 6.5–8.1)

## 2016-09-23 LAB — CBC
HCT: 34.7 % — ABNORMAL LOW (ref 36.0–46.0)
Hemoglobin: 11 g/dL — ABNORMAL LOW (ref 12.0–15.0)
MCH: 26.4 pg (ref 26.0–34.0)
MCHC: 31.7 g/dL (ref 30.0–36.0)
MCV: 83.2 fL (ref 78.0–100.0)
Platelets: 208 10*3/uL (ref 150–400)
RBC: 4.17 MIL/uL (ref 3.87–5.11)
RDW: 15 % (ref 11.5–15.5)
WBC: 10.2 10*3/uL (ref 4.0–10.5)

## 2016-09-23 LAB — GLUCOSE, CAPILLARY
Glucose-Capillary: 102 mg/dL — ABNORMAL HIGH (ref 65–99)
Glucose-Capillary: 119 mg/dL — ABNORMAL HIGH (ref 65–99)
Glucose-Capillary: 120 mg/dL — ABNORMAL HIGH (ref 65–99)
Glucose-Capillary: 123 mg/dL — ABNORMAL HIGH (ref 65–99)
Glucose-Capillary: 148 mg/dL — ABNORMAL HIGH (ref 65–99)
Glucose-Capillary: 157 mg/dL — ABNORMAL HIGH (ref 65–99)
Glucose-Capillary: 174 mg/dL — ABNORMAL HIGH (ref 65–99)
Glucose-Capillary: 205 mg/dL — ABNORMAL HIGH (ref 65–99)

## 2016-09-23 LAB — RPR: RPR Ser Ql: NONREACTIVE

## 2016-09-23 MED ORDER — MAGNESIUM SULFATE BOLUS VIA INFUSION
4.0000 g | Freq: Once | INTRAVENOUS | Status: DC
  Filled 2016-09-23: qty 500

## 2016-09-23 MED ORDER — LIDOCAINE HCL (PF) 1 % IJ SOLN
INTRAMUSCULAR | Status: DC | PRN
  Administered 2016-09-23: 13 mL via EPIDURAL

## 2016-09-23 MED ORDER — METFORMIN HCL ER 500 MG PO TB24
1000.0000 mg | ORAL_TABLET | Freq: Two times a day (BID) | ORAL | Status: DC
  Administered 2016-09-23 – 2016-09-25 (×4): 1000 mg via ORAL
  Filled 2016-09-23 (×6): qty 2

## 2016-09-23 MED ORDER — LEVOTHYROXINE SODIUM 175 MCG PO TABS
175.0000 ug | ORAL_TABLET | Freq: Every day | ORAL | Status: DC
  Administered 2016-09-24 – 2016-09-25 (×2): 175 ug via ORAL
  Filled 2016-09-23 (×3): qty 1

## 2016-09-23 MED ORDER — IBUPROFEN 600 MG PO TABS
600.0000 mg | ORAL_TABLET | Freq: Four times a day (QID) | ORAL | Status: DC
  Administered 2016-09-23 – 2016-09-25 (×7): 600 mg via ORAL
  Filled 2016-09-23 (×7): qty 1

## 2016-09-23 MED ORDER — TETANUS-DIPHTH-ACELL PERTUSSIS 5-2.5-18.5 LF-MCG/0.5 IM SUSP: 0.5000 mL | Freq: Once | INTRAMUSCULAR | Status: DC

## 2016-09-23 MED ORDER — PHENYLEPHRINE 40 MCG/ML (10ML) SYRINGE FOR IV PUSH (FOR BLOOD PRESSURE SUPPORT)
80.0000 ug | PREFILLED_SYRINGE | INTRAVENOUS | Status: DC | PRN
Start: 2016-09-23 — End: 2016-09-23

## 2016-09-23 MED ORDER — COCONUT OIL OIL: 1.0000 "application " | TOPICAL_OIL | Status: DC | PRN

## 2016-09-23 MED ORDER — PHENYLEPHRINE 40 MCG/ML (10ML) SYRINGE FOR IV PUSH (FOR BLOOD PRESSURE SUPPORT): 80.0000 ug | PREFILLED_SYRINGE | INTRAVENOUS | Status: DC | PRN

## 2016-09-23 MED ORDER — BENZOCAINE-MENTHOL 20-0.5 % EX AERO: 1.0000 "application " | INHALATION_SPRAY | CUTANEOUS | Status: DC | PRN

## 2016-09-23 MED ORDER — TERBUTALINE SULFATE 1 MG/ML IJ SOLN: 0.2500 mg | Freq: Once | INTRAMUSCULAR | Status: DC | PRN

## 2016-09-23 MED ORDER — SENNOSIDES-DOCUSATE SODIUM 8.6-50 MG PO TABS
2.0000 | ORAL_TABLET | ORAL | Status: DC
  Administered 2016-09-23 – 2016-09-24 (×2): 2 via ORAL
  Filled 2016-09-23 (×2): qty 2

## 2016-09-23 MED ORDER — EPHEDRINE 5 MG/ML INJ: 10.0000 mg | INTRAVENOUS | Status: DC | PRN

## 2016-09-23 MED ORDER — ACETAMINOPHEN 325 MG PO TABS
650.0000 mg | ORAL_TABLET | ORAL | Status: DC | PRN
Start: 2016-09-23 — End: 2016-09-25

## 2016-09-23 MED ORDER — FENTANYL 2.5 MCG/ML BUPIVACAINE 1/10 % EPIDURAL INFUSION (WH - ANES): 14.0000 mL/h | INTRAMUSCULAR | Status: DC | PRN

## 2016-09-23 MED ORDER — LACTATED RINGERS IV SOLN: 500.0000 mL | Freq: Once | INTRAVENOUS | Status: DC

## 2016-09-23 MED ORDER — ONDANSETRON HCL 4 MG PO TABS: 4.0000 mg | ORAL_TABLET | ORAL | Status: DC | PRN

## 2016-09-23 MED ORDER — FENTANYL CITRATE (PF) 100 MCG/2ML IJ SOLN
100.0000 ug | INTRAMUSCULAR | Status: DC | PRN
Start: 2016-09-23 — End: 2016-09-23
  Administered 2016-09-23: 100 ug via INTRAVENOUS

## 2016-09-23 MED ORDER — LABETALOL HCL 5 MG/ML IV SOLN: 20.0000 mg | INTRAVENOUS | Status: DC | PRN

## 2016-09-23 MED ORDER — WITCH HAZEL-GLYCERIN EX PADS: 1.0000 "application " | MEDICATED_PAD | CUTANEOUS | Status: DC | PRN

## 2016-09-23 MED ORDER — OXYTOCIN 40 UNITS IN LACTATED RINGERS INFUSION - SIMPLE MED
1.0000 m[IU]/min | INTRAVENOUS | Status: DC
  Administered 2016-09-23: 4 m[IU]/min via INTRAVENOUS
  Administered 2016-09-23: 2 m[IU]/min via INTRAVENOUS
  Filled 2016-09-23: qty 1000

## 2016-09-23 MED ORDER — BUPROPION HCL 75 MG PO TABS
75.0000 mg | ORAL_TABLET | Freq: Two times a day (BID) | ORAL | Status: DC
  Administered 2016-09-23 – 2016-09-25 (×5): 75 mg via ORAL
  Filled 2016-09-23 (×7): qty 1

## 2016-09-23 MED ORDER — DIBUCAINE 1 % RE OINT: 1.0000 "application " | TOPICAL_OINTMENT | RECTAL | Status: DC | PRN

## 2016-09-23 MED ORDER — PRENATAL MULTIVITAMIN CH
1.0000 | ORAL_TABLET | Freq: Every day | ORAL | Status: DC
  Administered 2016-09-24 – 2016-09-25 (×2): 1 via ORAL
  Filled 2016-09-23 (×2): qty 1

## 2016-09-23 MED ORDER — PYRIDOSTIGMINE BROMIDE 60 MG PO TABS
60.0000 mg | ORAL_TABLET | Freq: Three times a day (TID) | ORAL | Status: DC
  Administered 2016-09-23 – 2016-09-25 (×6): 60 mg via ORAL
  Filled 2016-09-23 (×9): qty 1

## 2016-09-23 MED ORDER — HYDRALAZINE HCL 20 MG/ML IJ SOLN: 10.0000 mg | Freq: Once | INTRAMUSCULAR | Status: DC | PRN

## 2016-09-23 MED ORDER — MAGNESIUM SULFATE 40 G IN LACTATED RINGERS - SIMPLE
2.0000 g/h | INTRAVENOUS | Status: DC
  Filled 2016-09-23: qty 500

## 2016-09-23 MED ORDER — DIPHENHYDRAMINE HCL 25 MG PO CAPS: 25.0000 mg | ORAL_CAPSULE | Freq: Four times a day (QID) | ORAL | Status: DC | PRN

## 2016-09-23 MED ORDER — ZOLPIDEM TARTRATE 5 MG PO TABS: 5.0000 mg | ORAL_TABLET | Freq: Every evening | ORAL | Status: DC | PRN

## 2016-09-23 MED ORDER — SIMETHICONE 80 MG PO CHEW
80.0000 mg | CHEWABLE_TABLET | ORAL | Status: DC | PRN
  Administered 2016-09-24: 80 mg via ORAL
  Filled 2016-09-23: qty 1

## 2016-09-23 MED ORDER — DIPHENHYDRAMINE HCL 50 MG/ML IJ SOLN: 12.5000 mg | INTRAMUSCULAR | Status: DC | PRN

## 2016-09-23 MED ORDER — ONDANSETRON HCL 4 MG/2ML IJ SOLN: 4.0000 mg | INTRAMUSCULAR | Status: DC | PRN

## 2016-09-23 NOTE — Anesthesia Preprocedure Evaluation (Signed)
Anesthesia Evaluation  Patient identified by MRN, date of birth, ID band Patient awake    Reviewed: Allergy & Precautions, NPO status , Patient's Chart, lab work & pertinent test results  Airway Mallampati: II  TM Distance: >3 FB Neck ROM: Full    Dental no notable dental hx. (+) Teeth Intact, Dental Advisory Given   Pulmonary asthma , sleep apnea ,    Pulmonary exam normal breath sounds clear to auscultation       Cardiovascular hypertension, Normal cardiovascular exam Rhythm:Regular Rate:Normal     Neuro/Psych  Headaches, PSYCHIATRIC DISORDERS Anxiety Depression Bipolar Disorder  Neuromuscular disease ([facility])    GI/Hepatic Neg liver ROS, GERD  ,  Endo/Other  diabetesMorbid obesityGraves disease  Renal/GU negative Renal ROS     Musculoskeletal  (+) Fibromyalgia -  Abdominal   Peds  Hematology  (+) Blood dyscrasia, anemia ,   Anesthesia Other Findings Day of surgery medications reviewed with the patient.  Reproductive/Obstetrics                             Anesthesia Physical  Anesthesia Plan  ASA: III  Anesthesia Plan: Epidural   Post-op Pain Management:    Induction:   PONV Risk Score and Plan:   Airway Management Planned:   Additional Equipment:   Intra-op Plan:   Post-operative Plan:   Informed Consent: I have reviewed the patients History and Physical, chart, labs and discussed the procedure including the risks, benefits and alternatives for the proposed anesthesia with the patient or authorized representative who has indicated his/her understanding and acceptance.     Plan Discussed with: CRNA  Anesthesia Plan Comments:         Anesthesia Quick Evaluation

## 2016-09-23 NOTE — Progress Notes (Signed)
Patient seen and examined. Cervix 5/90/-2.   When evaluated around 845 tracing with minimal variability and some decelerations.   Improved with repositioning and have continue to increase pitocin.   Attempted to place FSE, but did not seem to be monitoring. Did not replace.  Will monitor closely.  Jacquiline Doe, MD 09/23/16 9:34 AM

## 2016-09-23 NOTE — Anesthesia Procedure Notes (Signed)
Epidural Patient location during procedure: OB Start time: 09/23/2016 4:18 AM End time: 09/23/2016 4:33 AM  Staffing Anesthesiologist: Candida Peeling RAY Performed: anesthesiologist   Preanesthetic Checklist Completed: patient identified, site marked, surgical consent, pre-op evaluation, timeout performed, IV checked, risks and benefits discussed and monitors and equipment checked  Epidural Patient position: sitting Prep: DuraPrep Patient monitoring: heart rate, cardiac monitor, continuous pulse ox and blood pressure Approach: midline Location: L2-L3 Injection technique: LOR saline  Needle:  Needle type: Tuohy  Needle gauge: 17 G Needle length: 9 cm Needle insertion depth: 8.5 cm Catheter type: closed end flexible Catheter size: 20 Guage Catheter at skin depth: 13 cm Test dose: negative  Assessment Events: blood not aspirated, injection not painful, no injection resistance, negative IV test and no paresthesia  Additional Notes Reason for block:procedure for pain

## 2016-09-23 NOTE — Lactation Note (Addendum)
This note was copied from a baby's chart. Lactation Consultation Note  Patient Name: Jade Mathis Today's Date: 09/23/2016 Reason for consult: Initial assessment;NICU baby  BABY IS 63 1/2 HOURS old and [place] set up the DEBP after discussion  Of Supply and demand. LC instructed mom to pump around the clock 8x 's a day,  And hand express. LC showed mom how to hand express with few drops EBM yield,  Mom reports multiple breast changes with pregnancy.  LC set up the DEBP and checked the flange size and assisted mom with positioning.  The #24 flange the better fit once positioned well and mom pumped for 15 mins, with scant  Amount noted on the flange. Mom already obtained the EBM labels from NICU, DEBP provided  And the same containers ( colostrum), dish soap, and basin and instructed mom.  Per mom active with WIC / [place].  [place] faxed a DEBP request to [facility] 7/7 pm .  Mother informed of post-discharge support and given phone number to the lactation department, including services for phone call assistance; out-patient appointments; and breastfeeding support group. List of other breastfeeding resources in the community given in the handout. Encouraged mother to call for problems or concerns related to breastfeeding.   LC reviewed medications mom is presently taking:  Wellbrutrin - L3, limited data , probably compatible Metformin -L1  Mestinon -60 mg 3x's per day, L2 - limited data - non in milk     Maternal Data Has patient been taught Hand Expression?: Yes (few drops ) Does the patient have breastfeeding experience prior to this delivery?: No  Feeding    LATCH Score/Interventions                      Lactation Tools Discussed/Used Tools: Pump (Athena instructed mom / #24 F ok for today ) Breast pump type: Double-Electric Breast Pump WIC Program: Yes (per mom [place] WIC ) Pump Review: Setup, frequency, and cleaning;Milk Storage Initiated by:: MAi  Date initiated::  09/23/16   Consult Status Consult Status: Follow-up Date: 09/24/16 Follow-up type: In-patient    [place] 09/23/2016, 5:47 PM

## 2016-09-23 NOTE — Progress Notes (Signed)
Patient seen & examined for progress of labor. Patient comfortable with epidural. Noted to have decels but unable to trace contractions, IUPC placed. Patient remains 4.5/70/-3  FHT: 150 bpm, minimal to mod variability, occasional decel but unable to characterize without tracing contraction. IUPC now in place TOCO: q19min   A/P: Labor: continue to monitor on pitocin Pain: epidural FWB: category II for minimal to mod variability   Continue expectant management Anticipate SVD  Discussed care and FHT with Koren Shiver, CNM  Everrett Coombe, MD PGY-2 [facility]

## 2016-09-23 NOTE — Progress Notes (Signed)
Patient ID: Jade Mathis, female   DOB: [birth year], 41 y.o.   MRN: [id number] Doing well, Not hurting much with contractions  Vitals:   09/22/16 1809 09/22/16 1904 09/22/16 2030 09/23/16 0030  BP:   127/75 130/75  Pulse:      Resp: 16 18 17 16   Temp: 98.4 F (36.9 C)  98.1 F (36.7 C) 98.2 F (36.8 C)  TempSrc: Oral  Oral Oral  Weight:      Height:       FHR reactive Irregular contractions  Dilation: 2 Effacement (%): 50 Station: -3 Presentation: Vertex Exam by:: Evette Doffing  Foley in place  Will start Pitocin for augmentation of labor Fentanyl for pain Then Epidural prn

## 2016-09-24 LAB — GLUCOSE, CAPILLARY
Glucose-Capillary: 144 mg/dL — ABNORMAL HIGH (ref 65–99)
Glucose-Capillary: 146 mg/dL — ABNORMAL HIGH (ref 65–99)
Glucose-Capillary: 178 mg/dL — ABNORMAL HIGH (ref 65–99)
Glucose-Capillary: 162 mg/dL — ABNORMAL HIGH (ref 65–99)

## 2016-09-24 NOTE — Addendum Note (Signed)
Addendum  created 09/24/16 1342 by Asher Muir, CRNA   Charge Capture section accepted, Sign clinical note

## 2016-09-24 NOTE — Plan of Care (Signed)
Problem: Nutritional: Goal: Mothers verbalization of comfort with breastfeeding process will improve Outcome: Not Met (add Reason) Baby in NICu , breastfeeding not started yet.  Patient pumping PRN  Problem: Role Relationship: Goal: Ability to demonstrate positive interaction with newborn will improve Outcome: Not Applicable Date Met: 32/12/24 Unable to assess, baby in NICU

## 2016-09-24 NOTE — Anesthesia Postprocedure Evaluation (Signed)
Anesthesia Post Note  Patient: Haizley Cannella  Procedure(s) Performed: * No procedures listed *     Patient location during evaluation: Mother Baby Anesthesia Type: Epidural Level of consciousness: awake and alert Pain management: pain level controlled Vital Signs Assessment: post-procedure vital signs reviewed and stable Respiratory status: spontaneous breathing, nonlabored ventilation and respiratory function stable Cardiovascular status: stable Postop Assessment: no headache, no backache and epidural receding Anesthetic complications: no    Last Vitals:  Vitals:   09/24/16 0307 09/24/16 0743  BP: (!) 114/56 125/77  Pulse: 83 85  Resp: 18 16  Temp: 36.6 C 36.4 C    Last Pain:  Vitals:   09/24/16 0743  TempSrc: Oral  PainSc:    Pain Goal: Patients Stated Pain Goal: 3 (09/24/16 0606)               Lynda Rainwater

## 2016-09-24 NOTE — Anesthesia Postprocedure Evaluation (Signed)
Anesthesia Post Note  Patient: Jade Mathis  Procedure(s) Performed: * No procedures listed *     Patient location during evaluation: Mother Baby Anesthesia Type: Epidural Level of consciousness: awake Pain management: satisfactory to patient Vital Signs Assessment: post-procedure vital signs reviewed and stable Respiratory status: spontaneous breathing Cardiovascular status: stable Anesthetic complications: no    Last Vitals:  Vitals:   09/24/16 0307 09/24/16 0743  BP: (!) 114/56 125/77  Pulse: 83 85  Resp: 18 16  Temp: 36.6 C 36.4 C    Last Pain:  Vitals:   09/24/16 0743  TempSrc: Oral  PainSc:    Pain Goal: Patients Stated Pain Goal: 3 (09/24/16 0606)               Casimer Lanius

## 2016-09-24 NOTE — Lactation Note (Signed)
This note was copied from a baby's chart. Lactation Consultation Note  Patient Name: Jade Mathis [id number] Date: 09/24/2016 Reason for consult: Follow-up assessment   With this mom of a term baby, now 24  Hours old. Mom is 42 years old, her next youngest 12. Mom had good milk supplies with her other 3 children. Mom asked for help with hand expression. I showed mom how to HE, and was able to express only a tiny clear drop from her right nipple. Mom reports getting sleepy and uterine cramps with pumping. I explained this is normal, and means her hormones are working well. I advised mom to continue pumping, at least 8 times a day, to breast feed her baby/ and hold STS  once they allow mom to hold her in NICU, and pump after visiting her baby. I explained the importance of pumping  in the first 14 days, and how pumping now, even without  expressing any colostrum, is still helping her milk to transition in. Mom knows to call for questions/conerns.    Maternal Data    Feeding    LATCH Score/Interventions                      Lactation Tools Discussed/Used     Consult Status Consult Status: Follow-up Date: 09/25/16 Follow-up type: In-patient    Tonna Corner 09/24/2016, 1:29 PM

## 2016-09-24 NOTE — Progress Notes (Signed)
Post Partum Day 1 Subjective: right sciatic pain  Objective: Blood pressure 125/77, pulse 85, temperature 97.6 F (36.4 C), temperature source Oral, resp. rate 16, height 5\' 6"  (1.676 m), weight 122.5 kg (270 lb), last menstrual period 12/31/2015, SpO2 99 %, unknown if currently breastfeeding.  Physical Exam:  General: alert, cooperative and no distress Lochia: appropriate Uterine Fundus: firm Incision: n/a  DVT Evaluation: No evidence of DVT seen on physical exam.   Recent Labs  09/22/16 1045 09/23/16 1310  HGB 11.1* 11.0*  HCT 34.6* 34.7*    Assessment/Plan: Breastfeeding   LOS: 2 days   Emeterio Reeve 09/24/2016, 7:49 AM

## 2016-09-25 LAB — GLUCOSE, CAPILLARY: Glucose-Capillary: 153 mg/dL — ABNORMAL HIGH (ref 65–99)

## 2016-09-25 NOTE — Progress Notes (Signed)
Post Partum Day 2 Subjective: leg swelling  Objective: Blood pressure 116/69, pulse 79, temperature 98.8 F (37.1 C), temperature source Oral, resp. rate 18, height 5\' 6"  (1.676 m), weight 122.5 kg (270 lb), last menstrual period 12/31/2015, SpO2 100 %, unknown if currently breastfeeding.  Physical Exam:  General: alert, cooperative and no distress Lochia: appropriate Uterine Fundus: firm Incision: n/a DVT Evaluation: No evidence of DVT seen on physical exam. Calf/Ankle edema is present.   Recent Labs  09/22/16 1045 09/23/16 1310  HGB 11.1* 11.0*  HCT 34.6* 34.7*    Assessment/Plan: Plan for discharge tomorrow and Breastfeeding   LOS: 3 days   Emeterio Reeve 09/25/2016, 8:56 AM

## 2016-09-25 NOTE — Care Management Important Message (Signed)
Important Message  Patient Details  Name: Jade Mathis MRN: [id number] Date of Birth: 1975/11/20   Medicare Important Message Given:  Yes    Terril, Amaro 09/25/2016, 10:40 AM

## 2016-09-25 NOTE — Lactation Note (Signed)
This note was copied from a baby's chart. Lactation Consultation Note  Patient Name: Jade Mathis VPCHE'K Date: 09/25/2016 Reason for consult: Follow-up assessment;NICU baby   Follow up assessment with mom of 27 hour old NICU infant. Mom reports she is pumping about every 3 hours and is not obtaining colostrum yet. Mom reports she is hand expressing and does not need review. She is complaining of cramping with pumping, discussed this if common and should subside in a few days. She is taking Motrin per mom. Mom reports she was able to hold infant yesterday. Enc mom to pump after visiting/holding infant. Mom reports she asked if she could BF infant in the NICU yesterday and was told she could not as infant just started bottle feeding. Enc mom to inquire again today.    Mom reports she made a [facility] appt for Wednesday morning 7/11. She was informed of [facility] loaner program. It is unclear if mom will be d/c home today or tomorrow at this time. Mom informed to call for [facility] if being d/c home today if she wants to rent a pump. Mom reports she has no questions/concerns and does not need anything from Lactation at this time.    Maternal Data Formula Feeding for Exclusion: No Has patient been taught Hand Expression?: Yes  Feeding Feeding Type: Bottle Fed - Formula Nipple Type: Slow - flow Length of feed: 15 min  LATCH Score/Interventions                      Lactation Tools Discussed/Used WIC Program: Yes Pump Review: Setup, frequency, and cleaning Initiated by:: Reviewed and encouraged   Consult Status Consult Status: Follow-up Date: 09/26/16 Follow-up type: In-patient    [name] 09/25/2016, 10:05 AM

## 2016-09-25 NOTE — Discharge Summary (Signed)
OB Discharge Summary     Patient Name: Jade Mathis DOB: Feb 10, 1976 MRN: [id number]  Date of admission: 09/22/2016 Delivering MD: Waldemar Dickens   Date of discharge: 09/25/2016  Admitting diagnosis: 25WKS, CTX Intrauterine pregnancy: [redacted]w[redacted]d     Secondary diagnosis:  Active Problems:   Normal labor   Chorioamnionitis  Additional problems: . Patient Active Problem List   Diagnosis Date Noted  . Chorioamnionitis 09/23/2016  . Normal labor 09/22/2016  . Type 2 diabetes mellitus, uncontrolled ([place]) 07/12/2016  . Advanced maternal age in multigravida 03/21/2016  . Supervision of high-risk pregnancy 03/21/2016  . Preexisting diabetes complicating pregnancy, antepartum 03/06/2016  . Myasthenia gravis complicating pregnancy ([place]) 03/06/2016  . Depression affecting pregnancy, antepartum 03/06/2016  . Hypothyroidism affecting pregnancy, antepartum 03/06/2016  . Ventral hernia 05/02/2015  . Stool incontinence 07/18/2013  . Fibromyalgia 06/22/2013  . Pure hypercholesterolemia 11/02/2012  . OSA (obstructive sleep apnea) 01/26/2012  . GERD 03/03/2009  . DEPRESSION, MAJOR, RECURRENT, MODERATE 10/16/2008  . Migraine 08/04/2008  . HYPOTHYROIDISM, 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/31/2006  . Diabetes mellitus type 2 in obese ([place]) 05/17/2006  . Obesity in pregnancy 05/17/2006  . Myasthenia gravis ([place]) 05/17/2006  . Asthma 05/17/2006        Discharge diagnosis: Term Pregnancy Delivered                                                                                                Post partum procedures:none  Augmentation: Pitocin  Complications: Intrauterine Inflammation or infection ([place]niotis)  Hospital course:  Onset of Labor With Vaginal Delivery     41 y.o. yo [id number] at [redacted]w[redacted]d was admitted in Active Labor on 09/22/2016. Patient had an uncomplicated labor course as follows:  Membrane Rupture Time/Date: 9:00 AM ,09/22/2016   Intrapartum Procedures: Episiotomy: None [1]                         Lacerations:  None [1]  Patient had a delivery of a Viable infant. 09/23/2016  Information for the patient's newborn:  Aaima, Gaddie [[id number]]  Delivery Method: Vaginal, Vacuum (Extractor) (Filed from Delivery Summary)  L&D Delivery Note Date of Service: 09/23/2016 12:33 PM Waldemar Dickens, MD  Obstetrics    [] Hide copied text [] Hover for attribution information Patient is a 41 y/o G6 now P4 who was having some heart rate decelerations through out the day. She was progressing and we continued to monitor. She was complete and we attempted pushing with little movement. She was noted to be OP and rotation was attempted, but was not successful. She was placed on the peanut to labor down. I was called to the room for a fetal heart rate of 220 with variable decelerations. Infant was then at 3+ attempted pushing with little progress. Vaccuum placed. She did develop a fever to 102F just prior to delivery.  Operative Delivery Note At 12:20 PM a viable female was delivered via Vaginal, Vacuum Neurosurgeon).  Presentation: vertex; Position: Occiput,, Anterior; Station: +3.  Verbal consent: obtained from patient.  Risks and benefits discussed in detail.  Risks include, but are  not limited to the risks of anesthesia, bleeding, infection, damage to maternal tissues, fetal cephalhematoma.  There is also the risk of inability to effect vaginal delivery of the head, or shoulder dystocia that cannot be resolved by established maneuvers, leading to the need for emergency cesarean section.  APGAR: pending.   Placenta status: delivered intact with gentle traction, .   Cord: 3 vessel  with the following complications: nuchal x1  Cord pH: sent and pending  Anesthesia:  epidural Instruments: Kiwi Vaccum Episiotomy: None Lacerations: None Est. Blood Loss (mL): 150  Mom to postpartum.  Baby to NICU.  Jade Mathis       Pateint had an uncomplicated postpartum course.   She is ambulating, tolerating a regular diet, passing flatus, and urinating well. Patient is discharged home in stable condition on 09/25/16.   Physical exam  Vitals:   09/24/16 0307 09/24/16 0743 09/24/16 1935 09/25/16 0813  BP: (!) 114/56 125/77 (!) 101/57 116/69  Pulse: 83 85 77 79  Resp: 18 16 18 18   Temp: 97.9 F (36.6 C) 97.6 F (36.4 C) 98.1 F (36.7 C) 98.8 F (37.1 C)  TempSrc: Oral Oral Oral Oral  SpO2: 100% 99% 100% 100%  Weight:      Height:       General: alert, cooperative and no distress Lochia: appropriate Uterine Fundus: firm Incision: N/A DVT Evaluation: No evidence of DVT seen on physical exam. Labs: Lab Results  Component Value Date   WBC 10.2 09/23/2016   HGB 11.0 (L) 09/23/2016   HCT 34.7 (L) 09/23/2016   MCV 83.2 09/23/2016   PLT 208 09/23/2016   CMP Latest Ref Rng & Units 09/23/2016  Glucose 65 - 99 mg/dL 152(H)  BUN 6 - 20 mg/dL 10  Creatinine 0.44 - 1.00 mg/dL 0.71  Sodium 135 - 145 mmol/L 134(L)  Potassium 3.5 - 5.1 mmol/L 4.4  Chloride 101 - 111 mmol/L 104  CO2 22 - 32 mmol/L 23  Calcium 8.9 - 10.3 mg/dL 8.3(L)  Total Protein 6.5 - 8.1 g/dL 6.4(L)  Total Bilirubin 0.3 - 1.2 mg/dL 0.5  Alkaline Phos 38 - 126 U/L 120  AST 15 - 41 U/L 23  ALT 14 - 54 U/L 15    Discharge instruction: per After Visit Summary and "Baby and Me Booklet".  After visit meds:  Allergies as of 09/25/2016      Reactions   Depo-provera [medroxyprogesterone] Other (See Comments)   Reaction:  Headaches    Vicodin [hydrocodone-acetaminophen] Nausea Only      Medication List    STOP taking these medications   buPROPion 75 MG tablet Commonly known as:  WELLBUTRIN   Doxylamine-Pyridoxine 10-10 MG Tbec Commonly known as:  DICLEGIS   FREESTYLE LIBRE READER Devi   ondansetron 4 MG disintegrating tablet Commonly known as:  ZOFRAN ODT   oxyCODONE 15 MG immediate release tablet Commonly known as:  ROXICODONE   pantoprazole 40 MG tablet Commonly known as:   PROTONIX   predniSONE 10 MG tablet Commonly known as:  DELTASONE   valACYclovir 500 MG tablet Commonly known as:  VALTREX     TAKE these medications   ACCU-CHEK SOFTCLIX LANCETS lancets 1 each by Other route 4 (four) times daily. Use as instructed   ACCU-CHEK SOFTCLIX LANCETS lancets USE AS DIRECTED TO CHECK BLOOD GLUCOSE LEVELS THREE TIMES A DAY   albuterol (2.5 MG/3ML) 0.083% nebulizer solution Commonly known as:  PROVENTIL Take 3 mLs (2.5 mg total) by nebulization every 4 (four) hours as  needed for wheezing or shortness of breath.   butalbital-acetaminophen-caffeine 50-325-40 MG tablet Commonly known as:  FIORICET, ESGIC Take 1-2 tablets by mouth 2 (two) times daily as needed for headache.   cetirizine 10 MG tablet Commonly known as:  ZYRTEC Take 1 tablet (10 mg total) by mouth daily.   feeding supplement (GLUCERNA SHAKE) Liqd Take 237 mLs by mouth 3 (three) times daily between meals.   fluticasone 110 MCG/ACT inhaler Commonly known as:  FLOVENT HFA Inhale 1 puff into the lungs 2 (two) times daily.   glucose blood test strip Commonly known as:  ACCU-CHEK GUIDE Use to check blood sugar 3 times per day. Dx code E11.9   HUMALOG KWIKPEN 100 UNIT/ML KiwkPen Generic drug:  insulin lispro 22 units AC Breakfast/36 AC Lunch/36 AC Dinner What changed:  how much to take  how to take this  when to take this  additional instructions   Insulin Detemir 100 UNIT/ML Pen Commonly known as:  LEVEMIR 40 units in the morning and 110 units at bedtime. What changed:  how much to take  how to take this  when to take this  additional instructions   Insulin NPH (Human) (Isophane) 100 UNIT/ML Kiwkpen Commonly known as:  HUMULIN N KWIKPEN 45 units SQ at bedtime What changed:  how much to take  how to take this  when to take this  additional instructions   Insulin Syringes (Disposable) U-100 1 ML Misc 1 Device by Does not apply route as needed.   levothyroxine  175 MCG tablet Commonly known as:  SYNTHROID, LEVOTHROID Take 1 tablet (175 mcg total) by mouth daily.   metFORMIN 500 MG 24 hr tablet Commonly known as:  GLUCOPHAGE-XR TAKE 4 TABLETS BY MOUTH EVERY DAY WITH SUPPER   PRENATAL MULTI +DHA 27-0.8-228 MG Caps Take 1 capsule by mouth daily.   pyridostigmine 60 MG tablet Commonly known as:  MESTINON Take 1 tablet (60 mg total) by mouth 3 (three) times daily.       Diet: carb modified diet  Activity: Advance as tolerated. Pelvic rest for 6 weeks.   Outpatient follow up:4 weeks Follow up Appt:Future Appointments Date Time Provider [place]  11/03/2016 9:45 AM [facility] None  11/08/2016 8:45 AM Elayne Snare, MD [facility] None  12/29/2016 9:30 AM Narda Amber K, DO [facility] None   Follow up Visit:No Follow-up on file.  Postpartum contraception: IUD Mirena  Newborn Data: Live born female  Birth Weight: 7 lb 1.9 oz (3230 g) APGAR: , 8  Baby Feeding: Breast Disposition:NICU   09/25/2016 Emeterio Reeve, MD

## 2016-09-25 NOTE — Discharge Instructions (Signed)

## 2016-09-25 NOTE — Care Management Important Message (Signed)
Important Message  Patient Details  Name: Jade Mathis MRN: [id number] Date of Birth: 05-24-1975   Medicare Important Message Given:  Yes    Chellie, Vanlue 09/25/2016, 10:40 AM

## 2016-09-25 NOTE — Lactation Note (Signed)
This note was copied from a baby's chart. Lactation Consultation Note  Patient Name: Girl Sherica Paternostro VJDYN'X Date: 09/25/2016 Reason for consult: Follow-up assessment;NICU baby   Follow up with mom to explore pump options for home. Mom reports she is planning to call [facility] back and see if she can move up getting her pump. She requested a manual pump for home use. Mom was given a manual pump with instructions for use and cleaning. Mom given bottles and colostrum collection containers for home use. She has no further questions/concerns at this time. Mom is to call if she would like [facility] assistance in NICU.    Maternal Data Formula Feeding for Exclusion: No Has patient been taught Hand Expression?: Yes  Feeding Feeding Type: Bottle Fed - Formula Nipple Type: Slow - flow Length of feed: 15 min  LATCH Score/Interventions                      Lactation Tools Discussed/Used WIC Program: Yes Pump Review: Setup, frequency, and cleaning;Milk Storage Initiated by:: Reviewed and encouraged   Consult Status Consult Status: PRN Date: 09/26/16 Follow-up type: Call as needed    Debby Freiberg Josejuan Hoaglin 09/25/2016, 11:12 AM

## 2016-09-25 NOTE — Progress Notes (Signed)
Discharge teaching complete with pt. Pt understood all information and did not have any questions. Pt discharged home to family. Will spend time in NICU the rest of the day.

## 2016-09-26 ENCOUNTER — Encounter: Payer: Self-pay | Admitting: General Practice

## 2016-09-26 LAB — BPAM RBC
Blood Product Expiration Date: 201808072359
Blood Product Expiration Date: 201808102359
Unit Type and Rh: 5100
Unit Type and Rh: 5100

## 2016-09-26 LAB — TYPE AND SCREEN
ABO/RH(D): O POS
Antibody Screen: POSITIVE
DAT, IgG: NEGATIVE
Donor AG Type: NEGATIVE
Donor AG Type: NEGATIVE
PT AG Type: NEGATIVE
Unit division: 0
Unit division: 0

## 2016-09-27 NOTE — Clinical Social Work Maternal (Signed)
CLINICAL SOCIAL WORK MATERNAL/CHILD NOTE  Patient Details  Name: Jade Mathis MRN: [id number] Date of Birth: 10/30/1975  Date:  09/25/2016  Clinical Social Worker Initiating Note:  Elim Peale, LCSW Date/ Time Initiated:  09/25/16/1100     Child's Name:  Jade Mathis   Legal Guardian:  Other (Comment) (Parents: Ilona Kostick and Mathis Hamani)   Need for Interpreter:  None   Date of Referral:   (No referral-NICU admission and request by MOB to speak with CSW.)     Reason for Referral:      Referral Source:      Address:  [address] [place], [place] [address]  Phone number:  [phone]   Household Members:  Minor Children, Spouse (Parents have two sons in the home, ages 14 and 11.)   Natural Supports (not living in the home):  Immediate Family, Community (MOB reports that her sister, Doula and a babysitter are her greatest support people in addition to her husband.)   Professional Supports: None   Employment: Disabled   Type of Work: MOB receives Disability for numerous medical conditions.  FOB works at a warehouse.   Education:      Financial Resources:  Private Insurance, Medicaid   Other Resources:      Cultural/Religious Considerations Which May Impact Care: None stated.  MOB's facesheet notes religion as Christian.  Strengths:  Ability to meet basic needs    Risk Factors/Current Problems:  Mental Health Concerns  (Hx of Depression)   Cognitive State:  Able to Concentrate , Alert , Linear Thinking , Goal Oriented    Mood/Affect:  Irritable , Interested , Apprehensive , Calm    CSW Assessment: CSW met with parents in MOB's third floor room/316 to offer support, introduce services and complete assessment due to baby's admission to NICU.  MOB had also requested to see CSW. MOB states that she was told to ask for CSW because CSW could provide her with baby items including clothes and a car seat.  CSW contacted G. Penley/Director of Volunteer  Services regarding car seat, but MOB changed her mind as she was not interested in the one that was offered since it did not have a shade on it.  MOB states she will "try" to purchase one herself.  She reports that she does not have a bed for baby and asked for assistance.  CSW made referral to [employer] for a pack and play and baby basics to assist family. CSW inquired about MOB's supports and asked permission to speak openly with her company in the room.  When asked who she had with her, MOB replied, "why do you need to know."  CSW explained and MOB stated that her company was her husband and that CSW could say anything in front of him.  When asking about her children, she did not seem to want to be bothered with providing any information to CSW.  She states she has 3 sons, providing their ages (23, 14, and 11), but not their names.  She states her sister is caring for the younger two while she is in the hospital and that her oldest son is out of the home.   MOB states she feels she is coping well with baby's admission to NICU, but acknowledges feelings of sadness.  She reports, "it made me cry to see her hooked up to all those things," but states understanding of baby's need for intensive care.  She states, "I'd rather her be healthy and come home when   she's ready."  CSW commends her for this attitude and encouraged her to remind herself that this experience is necessary and temporary, although it is impossible to predict how long it will take at this time. MOB reports no symptoms of PMADs after her other children, however, states that she is treated for depression with Wellbutrin.  She reports that symptoms are well controlled.  She was attentive to information given by CSW regarding PMADs.  CSW encouraged her to talk with CSW and or her MD if she has concerns about her mental health at any time. CSW provided MOB with bus passes to assist her in getting to the hospital to be with baby after discharge and with CSW  contact information to let CSW know if there is any way CSW can support her family while baby is in the hospital.  CSW Plan/Description:  Information/Referral to Community Resources , No Further Intervention Required/No Barriers to Discharge, Patient/Family Education     Charrise Lardner Elizabeth, LCSW 09/25/2016, 12:00 PM  

## 2016-09-28 ENCOUNTER — Other Ambulatory Visit (HOSPITAL_COMMUNITY): Payer: Self-pay

## 2016-09-28 ENCOUNTER — Other Ambulatory Visit: Payer: Self-pay | Admitting: Obstetrics and Gynecology

## 2016-09-29 ENCOUNTER — Encounter (HOSPITAL_COMMUNITY): Payer: Self-pay | Admitting: *Deleted

## 2016-09-29 ENCOUNTER — Inpatient Hospital Stay (HOSPITAL_COMMUNITY): Admission: RE | Admit: 2016-09-29 | Payer: Medicare Other | Source: Ambulatory Visit

## 2016-10-03 ENCOUNTER — Encounter: Payer: Self-pay | Admitting: Internal Medicine

## 2016-10-05 ENCOUNTER — Other Ambulatory Visit (HOSPITAL_COMMUNITY): Payer: Self-pay

## 2016-10-11 ENCOUNTER — Encounter: Payer: Self-pay | Admitting: Internal Medicine

## 2016-10-11 ENCOUNTER — Encounter: Payer: Self-pay | Admitting: Nurse Practitioner

## 2016-10-11 ENCOUNTER — Ambulatory Visit (INDEPENDENT_AMBULATORY_CARE_PROVIDER_SITE_OTHER): Payer: Medicare Other | Admitting: Nurse Practitioner

## 2016-10-11 VITALS — BP 126/80 | HR 84 | Temp 98.7°F | Ht 66.0 in | Wt 238.0 lb

## 2016-10-11 DIAGNOSIS — F331 Major depressive disorder, recurrent, moderate: Secondary | ICD-10-CM | POA: Diagnosis not present

## 2016-10-11 DIAGNOSIS — J4530 Mild persistent asthma, uncomplicated: Secondary | ICD-10-CM

## 2016-10-11 MED ORDER — FLUTICASONE PROPIONATE HFA 110 MCG/ACT IN AERO: 1.0000 | INHALATION_SPRAY | Freq: Two times a day (BID) | RESPIRATORY_TRACT | 3 refills | Status: DC

## 2016-10-11 MED ORDER — ESCITALOPRAM OXALATE 10 MG PO TABS: 10.0000 mg | ORAL_TABLET | Freq: Every day | ORAL | 0 refills | Status: DC

## 2016-10-11 MED ORDER — QUETIAPINE FUMARATE 100 MG PO TABS: 100.0000 mg | ORAL_TABLET | Freq: Every day | ORAL | 0 refills | Status: DC

## 2016-10-11 NOTE — Patient Instructions (Signed)
Postpartum Depression and Baby Blues The postpartum period begins right after the birth of a baby. During this time, there is often a great amount of joy and excitement. It is also a time of many changes in the life of the parents. Regardless of how many times a mother gives birth, each child brings new challenges and dynamics to the family. It is not unusual to have feelings of excitement along with confusing shifts in moods, emotions, and thoughts. All mothers are at risk of developing postpartum depression or the "baby blues." These mood changes can occur right after giving birth, or they may occur many months after giving birth. The baby blues or postpartum depression can be mild or severe. Additionally, postpartum depression can go away rather quickly, or it can be a long-term condition. What are the causes? Raised hormone levels and the rapid drop in those levels are thought to be a main cause of postpartum depression and the baby blues. A number of hormones change during and after pregnancy. Estrogen and progesterone usually decrease right after the delivery of your baby. The levels of thyroid hormone and various cortisol steroids also rapidly drop. Other factors that play a role in these mood changes include major life events and genetics. What increases the risk? If you have any of the following risks for the baby blues or postpartum depression, know what symptoms to watch out for during the postpartum period. Risk factors that may increase the likelihood of getting the baby blues or postpartum depression include:  Having a personal or family history of depression.  Having depression while being pregnant.  Having premenstrual mood issues or mood issues related to oral contraceptives.  Having a lot of life stress.  Having marital conflict.  Lacking a social support network.  Having a baby with special needs.  Having health problems, such as diabetes.  What are the signs or  symptoms? Symptoms of baby blues include:  Brief changes in mood, such as going from extreme happiness to sadness.  Decreased concentration.  Difficulty sleeping.  Crying spells, tearfulness.  Irritability.  Anxiety.  Symptoms of postpartum depression typically begin within the first month after giving birth. These symptoms include:  Difficulty sleeping or excessive sleepiness.  Marked weight loss.  Agitation.  Feelings of worthlessness.  Lack of interest in activity or food.  Postpartum psychosis is a very serious condition and can be dangerous. Fortunately, it is rare. Displaying any of the following symptoms is cause for immediate medical attention. Symptoms of postpartum psychosis include:  Hallucinations and delusions.  Bizarre or disorganized behavior.  Confusion or disorientation.  How is this diagnosed? A diagnosis is made by an evaluation of your symptoms. There are no medical or lab tests that lead to a diagnosis, but there are various questionnaires that a health care provider may use to identify those with the baby blues, postpartum depression, or psychosis. Often, a screening tool called the [place] Postnatal Depression Scale is used to diagnose depression in the postpartum period. How is this treated? The baby blues usually goes away on its own in 1-2 weeks. Social support is often all that is needed. You will be encouraged to get adequate sleep and rest. Occasionally, you may be given medicines to help you sleep. Postpartum depression requires treatment because it can last several months or longer if it is not treated. Treatment may include individual or group therapy, medicine, or both to address any social, physiological, and psychological factors that may play a role in the  depression. Regular exercise, a healthy diet, rest, and social support may also be strongly recommended. Postpartum psychosis is more serious and needs treatment right away.  Hospitalization is often needed. Follow these instructions at home:  Get as much rest as you can. Nap when the baby sleeps.  Exercise regularly. Some women find yoga and walking to be beneficial.  Eat a balanced and nourishing diet.  Do little things that you enjoy. Have a cup of tea, take a bubble bath, read your favorite magazine, or listen to your favorite music.  Avoid alcohol.  Ask for help with household chores, cooking, grocery shopping, or running errands as needed. Do not try to do everything.  Talk to people close to you about how you are feeling. Get support from your partner, family members, friends, or other new moms.  Try to stay positive in how you think. Think about the things you are grateful for.  Do not spend a lot of time alone.  Only take over-the-counter or prescription medicine as directed by your health care provider.  Keep all your postpartum appointments.  Let your health care provider know if you have any concerns. Contact a health care provider if: You are having a reaction to or problems with your medicine. Get help right away if:  You have suicidal feelings.  You think you may harm the baby or someone else. This information is not intended to replace advice given to you by your health care provider. Make sure you discuss any questions you have with your health care provider. Document Released: 12/09/2003 Document Revised: 08/12/2015 Document Reviewed: 12/16/2012 Elsevier Interactive Patient Education  2017 [employer]  

## 2016-10-11 NOTE — Progress Notes (Signed)
Subjective:  Patient ID: Jade Mathis, female    DOB: 06-Nov-1975  Age: 41 y.o. MRN: [id number]  CC: Anxiety (anxiety,crying alot,irritating going on for 2 wks. me drefills. not sleeping good)   Depression       The patient presents with depression.  This is a chronic problem.  The current episode started 1 to 4 weeks ago.   The problem occurs intermittently.  The problem has been waxing and waning since onset.  Associated symptoms include fatigue, insomnia, restlessness, appetite change, body aches, myalgias and sad.  Associated symptoms include no decreased concentration, no helplessness, no hopelessness, no decreased interest, no headaches, no indigestion and no suicidal ideas.     Exacerbated by: recent child delivery (2weeks ago)  Past treatments include SSRIs - Selective serotonin reuptake inhibitors, SNRIs - Serotonin and norepinephrine reuptake inhibitors and psychotherapy.  Compliance with treatment is poor.  Past compliance problems include difficulty with treatment plan.  Previous treatment provided no relief relief.  Risk factors include a change in medications, family history of mental illness, history of mental illness and the patient not taking medications correctly.   Past medical history includes chronic fatigue syndrome, chronic pain, fibromyalgia, hypothyroidism, chronic illness, depression and mental health disorder.     Pertinent negatives include no suicide attempts and no head trauma.  Last seen by [facility] 35months ago Previous medications: Wellbutrin, cymbalta, prozac, trazodone, seroquel, gabapentin. Minimal improvement with medication. Waiting for called back from [facility].  She is married with 3 children. States her husband has to work a lot hence she is at home alone with children. States she does not have any family support either. Denies any tobacco use, substance abuse or ETOH use. She is not breastfeeding due to current medications.  Outpatient  Medications Prior to Visit  Medication Sig Dispense Refill  . ACCU-CHEK SOFTCLIX LANCETS lancets 1 each by Other route 4 (four) times daily. Use as instructed 100 each 7  . ACCU-CHEK SOFTCLIX LANCETS lancets USE AS DIRECTED TO CHECK BLOOD GLUCOSE LEVELS THREE TIMES A DAY 100 each 1  . albuterol (PROVENTIL) (2.5 MG/3ML) 0.083% nebulizer solution Take 3 mLs (2.5 mg total) by nebulization every 4 (four) hours as needed for wheezing or shortness of breath. 75 mL 6  . butalbital-acetaminophen-caffeine (FIORICET, ESGIC) 50-325-40 MG tablet Take 1-2 tablets by mouth 2 (two) times daily as needed for headache.    . cetirizine (ZYRTEC) 10 MG tablet Take 1 tablet (10 mg total) by mouth daily. 30 tablet 11  . feeding supplement, GLUCERNA SHAKE, (GLUCERNA SHAKE) LIQD Take 237 mLs by mouth 3 (three) times daily between meals. 90 Can 11  . glucose blood (ACCU-CHEK GUIDE) test strip Use to check blood sugar 3 times per day. Dx code E11.9 200 each 2  . HUMALOG KWIKPEN 100 UNIT/ML KiwkPen 22 units AC Breakfast/36 AC Lunch/36 AC Dinner (Patient taking differently: Inject 22 Units into the skin 3 (three) times daily. 22 units AC Breakfast/36 AC Lunch/36 AC Dinner) 30 mL 5  . Insulin Detemir (LEVEMIR) 100 UNIT/ML Pen 40 units in the morning and 110 units at bedtime. (Patient taking differently: Inject 40-110 Units into the skin 2 (two) times daily. 40 units in the morning and 110 units at bedtime.) 15 mL 11  . Insulin NPH, Human,, Isophane, (HUMULIN N KWIKPEN) 100 UNIT/ML Kiwkpen 45 units SQ at bedtime (Patient taking differently: Inject 45 Units into the skin at bedtime. 45 units SQ at bedtime) 15 mL 2  . Insulin Syringes, Disposable,  U-100 1 ML MISC 1 Device by Does not apply route as needed. 60 each 12  . levothyroxine (SYNTHROID, LEVOTHROID) 175 MCG tablet Take 1 tablet (175 mcg total) by mouth daily. 90 tablet 1  . metFORMIN (GLUCOPHAGE-XR) 500 MG 24 hr tablet TAKE 4 TABLETS BY MOUTH EVERY DAY WITH SUPPER 120 tablet  3  . Prenatal Vit-Fe Fum-FA-Omega (PRENATAL MULTI +DHA) 27-0.8-228 MG CAPS Take 1 capsule by mouth daily. 30 capsule 10  . pyridostigmine (MESTINON) 60 MG tablet Take 1 tablet (60 mg total) by mouth 3 (three) times daily. 270 tablet 3  . fluticasone (FLOVENT HFA) 110 MCG/ACT inhaler Inhale 1 puff into the lungs 2 (two) times daily. 1 Inhaler 12  . buPROPion (WELLBUTRIN XL) 24 hr tablet 300 mg      No facility-administered medications prior to visit.     ROS See HPI  Objective:  BP 126/80   Pulse 84   Temp 98.7 F (37.1 C)   Ht 5\' 6"  (1.676 m)   Wt 238 lb (108 kg)   SpO2 99%   Breastfeeding? No   BMI 38.41 kg/m   BP Readings from Last 3 Encounters:  10/11/16 126/80  09/25/16 116/69  09/21/16 104/72    Wt Readings from Last 3 Encounters:  10/11/16 238 lb (108 kg)  09/22/16 270 lb (122.5 kg)  09/21/16 272 lb 6.4 oz (123.6 kg)    Physical Exam  Constitutional: She is oriented to person, place, and time. No distress.  Cardiovascular: Normal rate.   Pulmonary/Chest: Effort normal.  Musculoskeletal: Normal range of motion.  Neurological: She is alert and oriented to person, place, and time.  Skin: Skin is warm and dry.  Psychiatric: She has a normal mood and affect. Her behavior is normal. Thought content normal.  Vitals reviewed.   Lab Results  Component Value Date   WBC 10.2 09/23/2016   HGB 11.0 (L) 09/23/2016   HCT 34.7 (L) 09/23/2016   PLT 208 09/23/2016   GLUCOSE 152 (H) 09/23/2016   CHOL 129 07/01/2015   TRIG 70.0 07/01/2015   HDL 30.60 (L) 07/01/2015   LDLCALC 84 07/01/2015   ALT 15 09/23/2016   AST 23 09/23/2016   NA 134 (L) 09/23/2016   K 4.4 09/23/2016   CL 104 09/23/2016   CREATININE 0.71 09/23/2016   BUN 10 09/23/2016   CO2 23 09/23/2016   TSH 1.26 05/23/2016   INR 1.06 11/03/2009   HGBA1C 6.3 06/20/2016   MICROALBUR 0.7 07/01/2015    [place] Mfm Fetal Bpp Wo Non Stress  Result Date:  09/21/2016 ----------------------------------------------------------------------  OBSTETRICS REPORT                      (Signed Final 09/21/2016 09:32 am) ---------------------------------------------------------------------- Patient Info  ID #:       [id number]                          D.O.B.:  1976-02-20 (40 yrs)  Name:       [name]                    Visit Date: 09/21/2016 08:52 am ---------------------------------------------------------------------- Performed By  Performed By:     Rodrigo Ran BS      Ref. Address:     [address]                    [facility]  [place], [place]  Attending:        Griffin Dakin MD         Location:         [facility]  Referred By:      Osborne Oman MD ---------------------------------------------------------------------- Orders   #  Description                                 Code   1  [place] MFM FETAL BPP WO NON STRESS              76819.01   2  [place] MFM OB FOLLOW UP                         76816.01  ----------------------------------------------------------------------   #  Ordered By               Order #        Accession #    Episode #   1  Seward Meth              [id number]      [id number]     [id number]   2  [place]            [id number]      [id number]     [id number]  ---------------------------------------------------------------------- Indications   [redacted] weeks gestation of pregnancy                Z3A.37   Hypothyroid (post surgical) on replacement     O99.280 E03.9   Rx   Pre-existing diabetes, type 2, in pregnancy,   O24.113   third trimester (insulin); normal fetal Jade   Advanced maternal age multigravida 49+,        O91.523   third trimester (low risk NIPS)   Obesity complicating pregnancy, third          O99.213   trimester   Medical complication of  pregnancy:             O26.90   myasthenia gravis on Mestinon and   Prednisone  ---------------------------------------------------------------------- OB History  Gravidity:    6         Term:   3         SAB:   2 ---------------------------------------------------------------------- Fetal Evaluation  Num Of Fetuses:     1  Fetal Heart         149  Rate(bpm):  Cardiac Activity:   Observed  Presentation:       Cephalic  Placenta:  Anterior, above cervical os  P. Cord Insertion:  Visualized, central  Amniotic Fluid  AFI FV:      Subjectively within normal limits  AFI Sum(cm)     %Tile       Largest Pocket(cm)  19.53           76          6.86  RUQ(cm)       RLQ(cm)       LUQ(cm)        LLQ(cm)  4.26          4.76          3.65           6.86 ---------------------------------------------------------------------- Biophysical Evaluation  Amniotic F.V:   Within normal limits       F. Tone:        Observed  F. Movement:    Observed                   Score:          8/8  F. Breathing:   Observed ---------------------------------------------------------------------- Biometry  BPD:      90.3  mm     G. Age:  36w 4d         37  %    CI:        72.73   %    70 - 86                                                          FL/HC:      22.1   %    20.9 - 22.7  HC:      336.7  mm     G. Age:  38w 4d         55  %    HC/AC:      0.92        0.92 - 1.05  AC:      365.8  mm     G. Age:  40w 3d       > 97  %    FL/BPD:     82.5   %    71 - 87  FL:       74.5  mm     G. Age:  38w 1d         58  %    FL/AC:      20.4   %    20 - 24  HUM:      62.5  mm     G. Age:  36w 2d         46  %  Est. FW:    3732  gm      8 lb 4 oz   > 90  % ---------------------------------------------------------------------- Gestational Age  LMP:           37w 6d        Date:  12/31/15                 EDD:   10/06/16  U/S Today:     38w 3d  EDD:   10/02/16  Best:          37w 6d     Det. By:  LMP  (12/31/15)           EDD:   10/06/16 ---------------------------------------------------------------------- Anatomy  Cranium:               Appears normal         Aortic Arch:            Previously seen  Cavum:                 Appears normal         Ductal Arch:            Previously seen  Ventricles:            Appears normal         Diaphragm:              Previously seen  Choroid Plexus:        Previously seen        Stomach:                Appears normal, left                                                                        sided  Cerebellum:            Previously seen        Abdomen:                Previously seen  Posterior Fossa:       Previously seen        Abdominal Wall:         Appears nml (cord                                                                        insert, abd wall)  Nuchal Fold:           Previously seen        Cord Vessels:           Previously seen  Face:                  Appears normal         Kidneys:                Previously seen                         (orbits and profile)  Lips:                  Appears normal         Bladder:                Appears normal  Thoracic:              Previously seen  Spine:                  Previously seen  Heart:                 Previously seen        Upper Extremities:      Previously seen  RVOT:                  Previously seen        Lower Extremities:      Previously seen  LVOT:                  Previously seen  Other:  Female gender. Heels and nasal bone previously visualized.          Technically difficult due to maternal habitus and fetal position. ---------------------------------------------------------------------- Cervix Uterus Adnexa  Cervix  Not visualized (advanced GA >29wks)  Uterus  No abnormality visualized.  Left Ovary  Not visualized.  Right Ovary  Not visualized.  Cul De Sac:   No free fluid seen.  Adnexa:       No abnormality visualized. ---------------------------------------------------------------------- Impression  Singleton  intrauterine pregnancy at 37+6 weeks with AMA,  type 2 DM and myasthenia gravis  Normal interval anatomy; anatomic survey complete  Normal amniotic fluid volume  EFW > 90th %tile; AC > 97th %tile; 3198 grams  BPP 8/8 ---------------------------------------------------------------------- Recommendations  BPP next week; scheduled for delivery 09/29/2016 ----------------------------------------------------------------------                 Griffin Dakin, MD Electronically Signed Final Report   09/21/2016 09:32 am ----------------------------------------------------------------------  [place] Mfm Ob Follow Up  Result Date: 09/21/2016 ----------------------------------------------------------------------  OBSTETRICS REPORT                      (Signed Final 09/21/2016 09:32 am) ---------------------------------------------------------------------- Patient Info  ID #:       [id number]                          D.O.B.:  04-22-1975 (40 yrs)  Name:       [name]                    Visit Date: 09/21/2016 08:52 am ---------------------------------------------------------------------- Performed By  Performed By:     Rodrigo Ran BS      Ref. Address:     [place], [place]  [place]  Attending:        Griffin Dakin MD         Location:         [facility]  Referred By:      Osborne Oman MD ---------------------------------------------------------------------- Orders   #  Description                                 Code   1  [place] MFM FETAL BPP WO NON STRESS              76819.01   2  [place] MFM OB FOLLOW UP                         76816.01  ----------------------------------------------------------------------   #  Ordered By               Order #        Accession #    Episode #   1  Seward Meth              [id number]      [id number]     [id number]   2  [place]            [id number]      [id number]     [id number]  ---------------------------------------------------------------------- Indications   [redacted] weeks gestation of pregnancy                Z3A.37   Hypothyroid (post surgical) on replacement     O99.280 E03.9   Rx   Pre-existing diabetes, type 2, in pregnancy,   O24.113   third trimester (insulin); normal fetal Jade   Advanced maternal age multigravida 30+,        O50.523   third trimester (low risk NIPS)   Obesity complicating pregnancy, third          O99.213   trimester   Medical complication of pregnancy:             O26.90   myasthenia gravis on Mestinon and   Prednisone  ---------------------------------------------------------------------- OB History  Gravidity:    6         Term:   3         SAB:   2 ---------------------------------------------------------------------- Fetal Evaluation  Num Of Fetuses:     1  Fetal Heart         149  Rate(bpm):  Cardiac Activity:   Observed  Presentation:       Cephalic  Placenta:           Anterior, above cervical os  P. Cord Insertion:  Visualized, central  Amniotic Fluid  AFI FV:      Subjectively within normal limits  AFI Sum(cm)     %Tile       Largest Pocket(cm)  19.53           76          6.86  RUQ(cm)       RLQ(cm)       LUQ(cm)        LLQ(cm)  4.26          4.76          3.65  6.86 ---------------------------------------------------------------------- Biophysical Evaluation  Amniotic F.V:   Within normal limits       F. Tone:        Observed  F. Movement:    Observed                   Score:          8/8  F. Breathing:   Observed ---------------------------------------------------------------------- Biometry  BPD:      90.3  mm     G. Age:  36w 4d         37  %    CI:        72.73   %    70 - 86                                                          FL/HC:      22.1   %    20.9 - 22.7  HC:      336.7  mm     G. Age:  38w 4d          62  %    HC/AC:      0.92        0.92 - 1.05  AC:      365.8  mm     G. Age:  40w 3d       > 97  %    FL/BPD:     82.5   %    71 - 87  FL:       74.5  mm     G. Age:  38w 1d         58  %    FL/AC:      20.4   %    20 - 24  HUM:      62.5  mm     G. Age:  36w 2d         46  %  Est. FW:    3732  gm      8 lb 4 oz   > 90  % ---------------------------------------------------------------------- Gestational Age  LMP:           37w 6d        Date:  12/31/15                 EDD:   10/06/16  U/S Today:     38w 3d                                        EDD:   10/02/16  Best:          37w 6d     Det. By:  LMP  (12/31/15)          EDD:   10/06/16 ---------------------------------------------------------------------- Anatomy  Cranium:               Appears normal         Aortic Arch:            Previously seen  Cavum:                 Appears normal  Ductal Arch:            Previously seen  Ventricles:            Appears normal         Diaphragm:              Previously seen  Choroid Plexus:        Previously seen        Stomach:                Appears normal, left                                                                        sided  Cerebellum:            Previously seen        Abdomen:                Previously seen  Posterior Fossa:       Previously seen        Abdominal Wall:         Appears nml (cord                                                                        insert, abd wall)  Nuchal Fold:           Previously seen        Cord Vessels:           Previously seen  Face:                  Appears normal         Kidneys:                Previously seen                         (orbits and profile)  Lips:                  Appears normal         Bladder:                Appears normal  Thoracic:              Previously seen        Spine:                  Previously seen  Heart:                 Previously seen        Upper Extremities:      Previously seen  RVOT:                  Previously seen         Lower Extremities:      Previously seen  LVOT:  Previously seen  Other:  Female gender. Heels and nasal bone previously visualized.          Technically difficult due to maternal habitus and fetal position. ---------------------------------------------------------------------- Cervix Uterus Adnexa  Cervix  Not visualized (advanced GA >29wks)  Uterus  No abnormality visualized.  Left Ovary  Not visualized.  Right Ovary  Not visualized.  Cul De Sac:   No free fluid seen.  Adnexa:       No abnormality visualized. ---------------------------------------------------------------------- Impression  Singleton intrauterine pregnancy at 37+6 weeks with AMA,  type 2 DM and myasthenia gravis  Normal interval anatomy; anatomic survey complete  Normal amniotic fluid volume  EFW > 90th %tile; AC > 97th %tile; 3198 grams  BPP 8/8 ---------------------------------------------------------------------- Recommendations  BPP next week; scheduled for delivery 09/29/2016 ----------------------------------------------------------------------                 Griffin Dakin, MD Electronically Signed Final Report   09/21/2016 09:32 am ----------------------------------------------------------------------   Assessment & Plan:   Mckinleigh was seen today for anxiety.  Diagnoses and all orders for this visit:  DEPRESSION, MAJOR, RECURRENT, MODERATE -     QUEtiapine (SEROQUEL) 100 MG tablet; Take 1 tablet (100 mg total) by mouth at bedtime. -     escitalopram (LEXAPRO) 10 MG tablet; Take 1 tablet (10 mg total) by mouth daily.  Mild persistent asthma without complication -     fluticasone (FLOVENT HFA) 110 MCG/ACT inhaler; Inhale 1 puff into the lungs 2 (two) times daily.   I am having Ms. Dada start on QUEtiapine and escitalopram. I am also having her maintain her albuterol, Insulin Syringes (Disposable), PRENATAL MULTI +DHA, pyridostigmine, ACCU-CHEK SOFTCLIX LANCETS, butalbital-acetaminophen-caffeine, glucose blood,  cetirizine, levothyroxine, feeding supplement (GLUCERNA SHAKE), metFORMIN, Insulin Detemir, Insulin NPH (Human) (Isophane), ACCU-CHEK SOFTCLIX LANCETS, HUMALOG KWIKPEN, and fluticasone. We will stop administering buPROPion.  Meds ordered this encounter  Medications  . QUEtiapine (SEROQUEL) 100 MG tablet    Sig: Take 1 tablet (100 mg total) by mouth at bedtime.    Dispense:  7 tablet    Refill:  0    Order Specific Question:   Supervising Provider    Answer:   Cassandria Anger [[id number]]  . escitalopram (LEXAPRO) 10 MG tablet    Sig: Take 1 tablet (10 mg total) by mouth daily.    Dispense:  7 tablet    Refill:  0    Order Specific Question:   Supervising Provider    Answer:   Cassandria Anger [[id number]]  . fluticasone (FLOVENT HFA) 110 MCG/ACT inhaler    Sig: Inhale 1 puff into the lungs 2 (two) times daily.    Dispense:  1 Inhaler    Refill:  3    Order Specific Question:   Supervising Provider    Answer:   Cassandria Anger [[id number]]    Follow-up: Return in about 1 week (around 10/18/2016) for depression.  Wilfred Lacy, NP

## 2016-10-12 ENCOUNTER — Other Ambulatory Visit (HOSPITAL_COMMUNITY): Payer: Self-pay

## 2016-10-16 ENCOUNTER — Other Ambulatory Visit: Payer: Self-pay | Admitting: Neurology

## 2016-10-18 ENCOUNTER — Encounter: Payer: Self-pay | Admitting: Neurology

## 2016-10-19 ENCOUNTER — Ambulatory Visit: Payer: Self-pay | Admitting: Nurse Practitioner

## 2016-10-20 DIAGNOSIS — G4733 Obstructive sleep apnea (adult) (pediatric): Secondary | ICD-10-CM | POA: Diagnosis not present

## 2016-10-23 DIAGNOSIS — Z79899 Other long term (current) drug therapy: Secondary | ICD-10-CM | POA: Diagnosis not present

## 2016-10-23 DIAGNOSIS — M5431 Sciatica, right side: Secondary | ICD-10-CM | POA: Diagnosis not present

## 2016-10-23 DIAGNOSIS — M5412 Radiculopathy, cervical region: Secondary | ICD-10-CM | POA: Diagnosis not present

## 2016-10-25 ENCOUNTER — Ambulatory Visit (INDEPENDENT_AMBULATORY_CARE_PROVIDER_SITE_OTHER): Payer: Medicare Other | Admitting: Advanced Practice Midwife

## 2016-10-25 ENCOUNTER — Encounter: Payer: Self-pay | Admitting: Advanced Practice Midwife

## 2016-10-25 DIAGNOSIS — Z3201 Encounter for pregnancy test, result positive: Secondary | ICD-10-CM

## 2016-10-25 LAB — POCT PREGNANCY, URINE: Preg Test, Ur: POSITIVE — AB

## 2016-10-25 NOTE — Patient Instructions (Signed)
Intrauterine Device Information An intrauterine device (IUD) is inserted into your uterus to prevent pregnancy. There are two types of IUDs available:  Copper IUD-This type of IUD is wrapped in copper wire and is placed inside the uterus. Copper makes the uterus and fallopian tubes produce a fluid that kills sperm. The copper IUD can stay in place for 10 years.  Hormone IUD-This type of IUD contains the hormone progestin (synthetic progesterone). The hormone thickens the cervical mucus and prevents sperm from entering the uterus. It also thins the uterine lining to prevent implantation of a fertilized egg. The hormone can weaken or kill the sperm that get into the uterus. One type of hormone IUD can stay in place for 5 years, and another type can stay in place for 3 years.  Your health care provider will make sure you are a good candidate for a contraceptive IUD. Discuss with your health care provider the possible side effects. Advantages of an intrauterine device  IUDs are highly effective, reversible, long acting, and low maintenance.  There are no estrogen-related side effects.  An IUD can be used when breastfeeding.  IUDs are not associated with weight gain.  The copper IUD works immediately after insertion.  The hormone IUD works right away if inserted within 7 days of your period starting. You will need to use a backup method of birth control for 7 days if the hormone IUD is inserted at any other time in your cycle.  The copper IUD does not interfere with your female hormones.  The hormone IUD can make heavy menstrual periods lighter and decrease cramping.  The hormone IUD can be used for 3 or 5 years.  The copper IUD can be used for 10 years. Disadvantages of an intrauterine device  The hormone IUD can be associated with irregular bleeding patterns.  The copper IUD can make your menstrual flow heavier and more painful.  You may experience cramping and vaginal bleeding after  insertion. This information is not intended to replace advice given to you by your health care provider. Make sure you discuss any questions you have with your health care provider. Document Released: 02/08/2004 Document Revised: 08/12/2015 Document Reviewed: 08/25/2012 Elsevier Interactive Patient Education  2017 [employer] Inc[employer]  

## 2016-10-25 NOTE — Progress Notes (Signed)
Subjective:     Jade Mathis is a 41 y.o. female who presents for a postpartum visit. She is 4 weeks postpartum following a spontaneous vaginal delivery. I have fully reviewed the prenatal and intrapartum course. The delivery was at 39 gestational weeks. Outcome: spontaneous vaginal delivery. Anesthesia: epidural.   We discussed that we cannot do her IUD today due to positive pregnancy test.  Dr Ilda Basset and Constant agree.  She became upset and did not want to do her postpartum visit today.  Offered repeat test in 2 days or next week, but she declined for me.   Objective:   Vitals:   10/25/16 0808  BP: 136/81  Pulse: (!) 102  Weight: 233 lb 1.6 oz (105.7 kg)          Assessment:     Incomplete postpartum exam.  Plan:    1. Follow up in: a few days for repeat pregnancy test or as needed.   2.  Encouraged to come back for visit

## 2016-10-26 ENCOUNTER — Telehealth: Payer: Self-pay | Admitting: Internal Medicine

## 2016-10-26 NOTE — Telephone Encounter (Signed)
Pt called in this morning stating she saw on Mychart a No show for last week 8/2, she asked why do I have this I called to resch and asked for an earlier time for that same day and I called the day before, I told her it was made on 7/25, confirmed on 7/31 and if doesn't look like you came, she said but I called to cancel and asked the girl I spoke to to make sure it was canceled, she said its sad yall arnt doing your job up there, I dont work there and if I did I would make sure I did my job. I told her right now all I can do is send a message to my manger to see if this can be waived.  She said okay do that and who is the manager and I told her and she said okay I need to know so I can make sure to tell her yall need to do your jobs. And she asked my name and I told her and she said ok so now im gonna start asking names so I can know who to call out on there jobs. She asked to speak to the manager and I transferred her to [employer].

## 2016-10-27 NOTE — Telephone Encounter (Signed)
Will not charge fee for this day as a courtesy.  Tried to reach patient by phone.  Recording came on stating patient was not reachable.  There was no voicemail.

## 2016-10-30 ENCOUNTER — Ambulatory Visit: Payer: Self-pay | Admitting: Advanced Practice Midwife

## 2016-10-31 ENCOUNTER — Ambulatory Visit: Payer: Self-pay | Admitting: Student

## 2016-11-02 DIAGNOSIS — Z136 Encounter for screening for cardiovascular disorders: Secondary | ICD-10-CM | POA: Diagnosis not present

## 2016-11-02 DIAGNOSIS — G7000 Myasthenia gravis without (acute) exacerbation: Secondary | ICD-10-CM | POA: Diagnosis not present

## 2016-11-02 DIAGNOSIS — I10 Essential (primary) hypertension: Secondary | ICD-10-CM | POA: Diagnosis not present

## 2016-11-02 DIAGNOSIS — Z7952 Long term (current) use of systemic steroids: Secondary | ICD-10-CM | POA: Diagnosis not present

## 2016-11-03 ENCOUNTER — Other Ambulatory Visit (INDEPENDENT_AMBULATORY_CARE_PROVIDER_SITE_OTHER): Payer: Medicare Other

## 2016-11-03 DIAGNOSIS — E1165 Type 2 diabetes mellitus with hyperglycemia: Secondary | ICD-10-CM | POA: Diagnosis not present

## 2016-11-03 DIAGNOSIS — Z794 Long term (current) use of insulin: Secondary | ICD-10-CM | POA: Diagnosis not present

## 2016-11-03 LAB — BASIC METABOLIC PANEL
BUN: 12 mg/dL (ref 6–23)
CO2: 30 mEq/L (ref 19–32)
Calcium: 8.9 mg/dL (ref 8.4–10.5)
Chloride: 101 mEq/L (ref 96–112)
Creatinine, Ser: 0.65 mg/dL (ref 0.40–1.20)
GFR: 128.74 mL/min (ref >60.00)
Glucose, Bld: 183 mg/dL — ABNORMAL HIGH (ref 70–99)
Potassium: 3.9 mEq/L (ref 3.5–5.1)
Sodium: 137 mEq/L (ref 135–145)

## 2016-11-03 LAB — TSH: TSH: 0.46 u[IU]/mL (ref 0.35–4.50)

## 2016-11-03 LAB — HEMOGLOBIN A1C: Hgb A1c MFr Bld: 6.9 % — ABNORMAL HIGH (ref 4.6–6.5)

## 2016-11-06 ENCOUNTER — Encounter: Payer: Self-pay | Admitting: Podiatry

## 2016-11-06 ENCOUNTER — Ambulatory Visit (INDEPENDENT_AMBULATORY_CARE_PROVIDER_SITE_OTHER): Payer: Medicare Other | Admitting: Podiatry

## 2016-11-06 ENCOUNTER — Ambulatory Visit (INDEPENDENT_AMBULATORY_CARE_PROVIDER_SITE_OTHER): Payer: Medicare Other

## 2016-11-06 DIAGNOSIS — B351 Tinea unguium: Secondary | ICD-10-CM

## 2016-11-06 DIAGNOSIS — R52 Pain, unspecified: Secondary | ICD-10-CM | POA: Diagnosis not present

## 2016-11-06 DIAGNOSIS — L600 Ingrowing nail: Secondary | ICD-10-CM | POA: Diagnosis not present

## 2016-11-07 ENCOUNTER — Other Ambulatory Visit: Payer: Self-pay | Admitting: Internal Medicine

## 2016-11-08 ENCOUNTER — Ambulatory Visit (INDEPENDENT_AMBULATORY_CARE_PROVIDER_SITE_OTHER): Payer: Medicare Other | Admitting: Endocrinology

## 2016-11-08 ENCOUNTER — Other Ambulatory Visit: Payer: Self-pay | Admitting: Internal Medicine

## 2016-11-08 ENCOUNTER — Encounter: Payer: Self-pay | Admitting: Endocrinology

## 2016-11-08 VITALS — BP 120/84 | HR 93 | Ht 66.0 in | Wt 238.4 lb

## 2016-11-08 DIAGNOSIS — E1142 Type 2 diabetes mellitus with diabetic polyneuropathy: Secondary | ICD-10-CM

## 2016-11-08 DIAGNOSIS — Z794 Long term (current) use of insulin: Secondary | ICD-10-CM | POA: Diagnosis not present

## 2016-11-08 DIAGNOSIS — E1165 Type 2 diabetes mellitus with hyperglycemia: Secondary | ICD-10-CM | POA: Diagnosis not present

## 2016-11-08 DIAGNOSIS — E063 Autoimmune thyroiditis: Secondary | ICD-10-CM

## 2016-11-08 MED ORDER — EXENATIDE ER 2 MG/0.85ML ~~LOC~~ AUIJ: 2.0000 mg | AUTO-INJECTOR | SUBCUTANEOUS | 2 refills | Status: DC

## 2016-11-08 MED ORDER — LEVOTHYROXINE SODIUM 150 MCG PO TABS: 150.0000 ug | ORAL_TABLET | Freq: Every day | ORAL | 3 refills | Status: DC

## 2016-11-08 NOTE — Patient Instructions (Addendum)
Call back with insulin doses  Call if getting low

## 2016-11-08 NOTE — Progress Notes (Signed)
Subjective: Ms. Asbill presents the office today for concerns of a 1 day history of her left big toenail becoming ingrown and painful again. She states that she did try to trim this out herself and it cut her skin sounds, pointing toe lateral nail border. She denies any swelling oss or any drainage and the toenail but is painful with pressure and shoes. Denies any systemic complaints such as fevers, chills, nausea, vomiting. No acute changes since last appointment, and no other complaints at this time.   Objective: AAO x3, NAD DP/PT pulses palpable bilaterally, CRT less than 3 seconds Left hallux toenail appears to be hypertrophic, dystrophic, discolored with yellow to brown discoloration and there is incurvation of both the medial and lateral aspects of the nail bore nail. There is no edema, erythema, increase in warmth. No swelling. No drainage or pus. There is evidence of dried blood to the lateral nail corner. There is also dried blood on the other nails she tried to cut.  No edema, erythema, increase in warmth to bilateral lower extremities.  No open lesions or pre-ulcerative lesions.  No pain with calf compression, swelling, warmth, erythema  Assessment: Symptomatic ingrown toenail, onychomycosis left hallux toenail  Plan: -All treatment options discussed with the patient including all alternatives, risks, complications.  -At this point discussed with her nail avulsion, she was still off on any procedure  And would like to just have the area trimmed. The nail was sharply debrided today without complications or bleeding. Due to the small cuff that he had her skin recommended antibiotic ointment and a bandage daily. -Patient encouraged to call the office with any questions, concerns, change in symptoms.  -RTC 3 months. Recommended routine nail debridements given pain and mycotic infection.   Celesta Gentile, DPM

## 2016-11-08 NOTE — Telephone Encounter (Signed)
Pt called regarding this states she had a refill but it expired while she was pregnant.  Has yr fu set up w/ Sharlet [facility]ina 11/2016
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
## 2016-11-09 ENCOUNTER — Encounter: Payer: Self-pay | Admitting: Obstetrics and Gynecology

## 2016-11-09 ENCOUNTER — Ambulatory Visit (INDEPENDENT_AMBULATORY_CARE_PROVIDER_SITE_OTHER): Payer: Medicare Other | Admitting: Obstetrics and Gynecology

## 2016-11-09 VITALS — BP 125/80 | HR 90 | Ht 66.5 in | Wt 236.9 lb

## 2016-11-09 DIAGNOSIS — Z30430 Encounter for insertion of intrauterine contraceptive device: Secondary | ICD-10-CM | POA: Diagnosis not present

## 2016-11-09 DIAGNOSIS — Z3202 Encounter for pregnancy test, result negative: Secondary | ICD-10-CM | POA: Diagnosis not present

## 2016-11-09 LAB — POCT PREGNANCY, URINE: Preg Test, Ur: NEGATIVE

## 2016-11-09 MED ORDER — LEVONORGESTREL 18.6 MCG/DAY IU IUD
INTRAUTERINE_SYSTEM | Freq: Once | INTRAUTERINE | Status: AC
  Administered 2016-11-09: 10:00:00 via INTRAUTERINE

## 2016-11-09 NOTE — Progress Notes (Signed)
   GYNECOLOGY CLINIC PROCEDURE NOTE  Jade Mathis is a 41 y.o. [id number] here for [place] IUD insertion. No GYN concerns. Last pap smear was normal in 2016.  IUD Insertion Procedure Note Patient identified, informed consent performed, consent signed.   Discussed risks of irregular bleeding, cramping, infection, malpositioning or misplacement of the IUD outside the uterus which may require further procedure such as laparoscopy. Time out was performed.  Urine pregnancy test negative.  Speculum placed in the vagina.  Cervix visualized.  Cleaned with Betadine x 2.  Grasped anteriorly with a single tooth tenaculum.  Uterus sounded to 8 cm.  Liletta IUD placed per manufacturer's recommendations.  Strings trimmed to 2 cm. Tenaculum was removed, good hemostasis noted.  Patient tolerated procedure well.   Patient was given post-procedure instructions.  She was advised to have backup contraception for one week.  Patient was also asked to check IUD strings periodically and follow up in 4 weeks for IUD check.

## 2016-11-09 NOTE — Progress Notes (Signed)
Subjective:     Jade Mathis is a 41 y.o. female who presents for a postpartum visit. She is 6 weeks postpartum following a spontaneous vaginal delivery. I have fully reviewed the prenatal and intrapartum course. The delivery was at 69* gestational weeks. Outcome: vacuum, outlet. Anesthesia: epidural. Postpartum course has been uncomplicated.  Baby's course has been uncomplicated. Baby is feeding by bottle - Similac with Iron and soy. Bleeding no bleeding. Bowel function is having constipation. Bladder function is normal. Patient is not sexually active. Contraception method is IUDPostpartum depression screening: positive.Offered to see Roselyn Reef our behavioral health clinician, and she declined.   The following portions of the patient's history were reviewed and updated as appropriate: allergies, current medications, past family history, past medical history, past social history, past surgical history and problem list.  Review of Systems Pertinent items are noted in HPI.   Objective:    BP 125/80   Pulse 90   Ht 5' 6.5" (1.689 m)   Wt 236 lb 14.4 oz (107.5 kg)   Breastfeeding? No   BMI 37.66 kg/m   General:  alert, cooperative and appears stated age  Lungs: clear to auscultation bilaterally  Heart:  regular rate and rhythm, S1, S2 normal, no murmur, click, rub or gallop  Abdomen: soft, non-tender; bowel sounds normal; no masses,  no organomegaly   Vulva:  normal  Vagina: normal vagina  Cervix:  multiparous appearance, no cervical motion tenderness and no lesions  Corpus: not examined  Adnexa:  not evaluated  Rectal Exam: Not performed.        Assessment:   Normal postpartum exam. Pap smear not done at today's visit. Last pap was done in 2016 and it was normal.   Plan:   1. Contraception: IUD 2. Recommend PCP for depression.  3. Follow up in: 4 week or IUD check.   Lezlie Lye, NP 11/11/2016 9:44 PM

## 2016-11-10 ENCOUNTER — Encounter: Payer: Self-pay | Admitting: Endocrinology

## 2016-11-13 NOTE — Telephone Encounter (Signed)
11/06/16 sent MyChart message to patient advising that DOS 10/19/16 was changed from a No Show to a cancellation as a patient courtesy.

## 2016-11-14 ENCOUNTER — Encounter: Payer: Self-pay | Admitting: Endocrinology

## 2016-11-15 ENCOUNTER — Encounter: Payer: Self-pay | Admitting: Endocrinology

## 2016-11-16 ENCOUNTER — Ambulatory Visit: Payer: Self-pay | Admitting: Obstetrics & Gynecology

## 2016-11-16 ENCOUNTER — Encounter: Payer: Self-pay | Admitting: Obstetrics & Gynecology

## 2016-11-16 ENCOUNTER — Telehealth: Payer: Self-pay | Admitting: *Deleted

## 2016-11-16 DIAGNOSIS — E119 Type 2 diabetes mellitus without complications: Secondary | ICD-10-CM | POA: Diagnosis not present

## 2016-11-16 DIAGNOSIS — Z713 Dietary counseling and surveillance: Secondary | ICD-10-CM | POA: Diagnosis not present

## 2016-11-16 NOTE — Telephone Encounter (Signed)
Pt called and stated that she is unhappy that she was given the [place] IUD and wants it removed immediately. She is having bleeding with clots and feels weak. Advised that we only use liletta in our office. Pt coming this afternoon to have it removed.

## 2016-11-20 DIAGNOSIS — G4733 Obstructive sleep apnea (adult) (pediatric): Secondary | ICD-10-CM | POA: Diagnosis not present

## 2016-11-23 ENCOUNTER — Ambulatory Visit: Payer: Medicare Other | Admitting: Obstetrics and Gynecology

## 2016-11-28 ENCOUNTER — Encounter: Payer: Self-pay | Admitting: Neurology

## 2016-11-28 ENCOUNTER — Encounter: Payer: Self-pay | Admitting: Internal Medicine

## 2016-11-28 ENCOUNTER — Telehealth: Payer: Self-pay | Admitting: Neurology

## 2016-11-28 DIAGNOSIS — E039 Hypothyroidism, unspecified: Secondary | ICD-10-CM | POA: Diagnosis not present

## 2016-11-28 DIAGNOSIS — E119 Type 2 diabetes mellitus without complications: Secondary | ICD-10-CM | POA: Diagnosis not present

## 2016-11-28 DIAGNOSIS — M064 Inflammatory polyarthropathy: Secondary | ICD-10-CM | POA: Diagnosis not present

## 2016-11-28 DIAGNOSIS — M797 Fibromyalgia: Secondary | ICD-10-CM | POA: Diagnosis not present

## 2016-11-28 DIAGNOSIS — G7000 Myasthenia gravis without (acute) exacerbation: Secondary | ICD-10-CM | POA: Diagnosis not present

## 2016-11-28 DIAGNOSIS — G4737 Central sleep apnea in conditions classified elsewhere: Secondary | ICD-10-CM | POA: Diagnosis not present

## 2016-11-28 NOTE — Telephone Encounter (Signed)
Meilta called needing to speak with you regarding her Prednisone medication. Thanks

## 2016-11-29 ENCOUNTER — Encounter: Payer: Self-pay | Admitting: Neurology

## 2016-11-29 ENCOUNTER — Encounter: Payer: Self-pay | Admitting: Internal Medicine

## 2016-11-29 ENCOUNTER — Telehealth: Payer: Self-pay | Admitting: *Deleted

## 2016-11-29 ENCOUNTER — Ambulatory Visit (INDEPENDENT_AMBULATORY_CARE_PROVIDER_SITE_OTHER): Payer: Medicare Other | Admitting: Internal Medicine

## 2016-11-29 VITALS — BP 130/90 | HR 80 | Temp 98.4°F | Ht 66.5 in | Wt 234.0 lb

## 2016-11-29 DIAGNOSIS — J4530 Mild persistent asthma, uncomplicated: Secondary | ICD-10-CM

## 2016-11-29 DIAGNOSIS — Z23 Encounter for immunization: Secondary | ICD-10-CM | POA: Diagnosis not present

## 2016-11-29 DIAGNOSIS — Z0000 Encounter for general adult medical examination without abnormal findings: Secondary | ICD-10-CM | POA: Insufficient documentation

## 2016-11-29 MED ORDER — VALACYCLOVIR HCL 500 MG PO TABS: 500.0000 mg | ORAL_TABLET | Freq: Two times a day (BID) | ORAL | 2 refills | Status: DC

## 2016-11-29 MED ORDER — FERRALET 90 90-1 MG PO TABS: 1.0000 | ORAL_TABLET | Freq: Every day | ORAL | 3 refills | Status: DC

## 2016-11-29 MED ORDER — ALBUTEROL SULFATE HFA 108 (90 BASE) MCG/ACT IN AERS: 2.0000 | INHALATION_SPRAY | Freq: Four times a day (QID) | RESPIRATORY_TRACT | 2 refills | Status: DC | PRN

## 2016-11-29 MED ORDER — PHENTERMINE HCL 37.5 MG PO TABS: 37.5000 mg | ORAL_TABLET | Freq: Every day | ORAL | 2 refills | Status: DC

## 2016-11-29 MED ORDER — BUTALBITAL-APAP-CAFFEINE 50-325-40 MG PO TABS: 1.0000 | ORAL_TABLET | Freq: Two times a day (BID) | ORAL | 0 refills | Status: DC | PRN

## 2016-11-29 MED ORDER — IBUPROFEN 800 MG PO TABS: 800.0000 mg | ORAL_TABLET | Freq: Three times a day (TID) | ORAL | 0 refills | Status: DC | PRN

## 2016-11-29 MED ORDER — DOCUSATE SODIUM 100 MG PO CAPS: 100.0000 mg | ORAL_CAPSULE | Freq: Two times a day (BID) | ORAL | 11 refills | Status: DC

## 2016-11-29 NOTE — Telephone Encounter (Signed)
Patient does not want to be seen.  She wants answer via My Chart.

## 2016-11-29 NOTE — Patient Instructions (Addendum)
We have refilled the albuterol and ibuprofen and valtrex. We have given you the phentermine prescription.   Health Maintenance, Female Adopting a healthy lifestyle and getting preventive care can go a long way to promote health and wellness. Talk with your health care provider about what schedule of regular examinations is right for you. This is a good chance for you to check in with your provider about disease prevention and staying healthy. In between checkups, there are plenty of things you can do on your own. Experts have done a lot of research about which lifestyle changes and preventive measures are most likely to keep you healthy. Ask your health care provider for more information. Weight and diet Eat a healthy diet  Be sure to include plenty of vegetables, fruits, low-fat dairy products, and lean protein.  Do not eat a lot of foods high in solid fats, added sugars, or salt.  Get regular exercise. This is one of the most important things you can do for your health. ? Most adults should exercise for at least 150 minutes each week. The exercise should increase your heart rate and make you sweat (moderate-intensity exercise). ? Most adults should also do strengthening exercises at least twice a week. This is in addition to the moderate-intensity exercise.  Maintain a healthy weight  Body mass index (BMI) is a measurement that can be used to identify possible weight problems. It estimates body fat based on height and weight. Your health care provider can help determine your BMI and help you achieve or maintain a healthy weight.  For females 76 years of age and older: ? A BMI below 18.5 is considered underweight. ? A BMI of 18.5 to 24.9 is normal. ? A BMI of 25 to 29.9 is considered overweight. ? A BMI of 30 and above is considered obese.  Watch levels of cholesterol and blood lipids  You should start having your blood tested for lipids and cholesterol at 42 years of age, then have this  test every 5 years.  You may need to have your cholesterol levels checked more often if: ? Your lipid or cholesterol levels are high. ? You are older than 42 years of age. ? You are at high risk for heart disease.  Cancer screening Lung Cancer  Lung cancer screening is recommended for adults 22-41 years old who are at high risk for lung cancer because of a history of smoking.  A yearly low-dose CT scan of the lungs is recommended for people who: ? Currently smoke. ? Have quit within the past 15 years. ? Have at least a 30-pack-year history of smoking. A pack year is smoking an average of one pack of cigarettes a day for 1 year.  Yearly screening should continue until it has been 15 years since you quit.  Yearly screening should stop if you develop a health problem that would prevent you from having lung cancer treatment.  Breast Cancer  Practice breast self-awareness. This means understanding how your breasts normally appear and feel.  It also means doing regular breast self-exams. Let your health care provider know about any changes, no matter how small.  If you are in your 20s or 30s, you should have a clinical breast exam (CBE) by a health care provider every 1-3 years as part of a regular health exam.  If you are 41 or older, have a CBE every year. Also consider having a breast X-ray (mammogram) every year.  If you have a family history of  breast cancer, talk to your health care provider about genetic screening.  If you are at high risk for breast cancer, talk to your health care provider about having an MRI and a mammogram every year.  Breast cancer gene (BRCA) assessment is recommended for women who have family members with BRCA-related cancers. BRCA-related cancers include: ? Breast. ? Ovarian. ? Tubal. ? Peritoneal cancers.  Results of the assessment will determine the need for genetic counseling and BRCA1 and BRCA2 testing.  Cervical Cancer Your health care  provider may recommend that you be screened regularly for cancer of the pelvic organs (ovaries, uterus, and vagina). This screening involves a pelvic examination, including checking for microscopic changes to the surface of your cervix (Pap test). You may be encouraged to have this screening done every 3 years, beginning at age 39.  For women ages 66-65, health care providers may recommend pelvic exams and Pap testing every 3 years, or they may recommend the Pap and pelvic exam, combined with testing for human papilloma virus (HPV), every 5 years. Some types of HPV increase your risk of cervical cancer. Testing for HPV may also be done on women of any age with unclear Pap test results.  Other health care providers may not recommend any screening for nonpregnant women who are considered low risk for pelvic cancer and who do not have symptoms. Ask your health care provider if a screening pelvic exam is right for you.  If you have had past treatment for cervical cancer or a condition that could lead to cancer, you need Pap tests and screening for cancer for at least 20 years after your treatment. If Pap tests have been discontinued, your risk factors (such as having a new sexual partner) need to be reassessed to determine if screening should resume. Some women have medical problems that increase the chance of getting cervical cancer. In these cases, your health care provider may recommend more frequent screening and Pap tests.  Colorectal Cancer  This type of cancer can be detected and often prevented.  Routine colorectal cancer screening usually begins at 42 years of age and continues through 42 years of age.  Your health care provider may recommend screening at an earlier age if you have risk factors for colon cancer.  Your health care provider may also recommend using home test kits to check for hidden blood in the stool.  A small camera at the end of a tube can be used to examine your colon  directly (sigmoidoscopy or colonoscopy). This is done to check for the earliest forms of colorectal cancer.  Routine screening usually begins at age 51.  Direct examination of the colon should be repeated every 5-10 years through 41 years of age. However, you may need to be screened more often if early forms of precancerous polyps or small growths are found.  Skin Cancer  Check your skin from head to toe regularly.  Tell your health care provider about any new moles or changes in moles, especially if there is a change in a mole's shape or color.  Also tell your health care provider if you have a mole that is larger than the size of a pencil eraser.  Always use sunscreen. Apply sunscreen liberally and repeatedly throughout the day.  Protect yourself by wearing long sleeves, pants, a wide-brimmed hat, and sunglasses whenever you are outside.  Heart disease, diabetes, and high blood pressure  High blood pressure causes heart disease and increases the risk of stroke. High blood  pressure is more likely to develop in: ? People who have blood pressure in the high end of the normal range (130-139/85-89 mm Hg). ? People who are overweight or obese. ? People who are African American.  If you are 37-15 years of age, have your blood pressure checked every 3-5 years. If you are 50 years of age or older, have your blood pressure checked every year. You should have your blood pressure measured twice-once when you are at a hospital or clinic, and once when you are not at a hospital or clinic. Record the average of the two measurements. To check your blood pressure when you are not at a hospital or clinic, you can use: ? An automated blood pressure machine at a pharmacy. ? A home blood pressure monitor.  If you are between 54 years and 47 years old, ask your health care provider if you should take aspirin to prevent strokes.  Have regular diabetes screenings. This involves taking a blood sample to  check your fasting blood sugar level. ? If you are at a normal weight and have a low risk for diabetes, have this test once every three years after 42 years of age. ? If you are overweight and have a high risk for diabetes, consider being tested at a younger age or more often. Preventing infection Hepatitis B  If you have a higher risk for hepatitis B, you should be screened for this virus. You are considered at high risk for hepatitis B if: ? You were born in a country where hepatitis B is common. Ask your health care provider which countries are considered high risk. ? Your parents were born in a high-risk country, and you have not been immunized against hepatitis B (hepatitis B vaccine). ? You have HIV or AIDS. ? You use needles to inject street drugs. ? You live with someone who has hepatitis B. ? You have had sex with someone who has hepatitis B. ? You get hemodialysis treatment. ? You take certain medicines for conditions, including cancer, organ transplantation, and autoimmune conditions.  Hepatitis C  Blood testing is recommended for: ? Everyone born from [birth year] through 1966. ? Anyone with known risk factors for hepatitis C.  Sexually transmitted infections (STIs)  You should be screened for sexually transmitted infections (STIs) including gonorrhea and chlamydia if: ? You are sexually active and are younger than 42 years of age. ? You are older than 42 years of age and your health care provider tells you that you are at risk for this type of infection. ? Your sexual activity has changed since you were last screened and you are at an increased risk for chlamydia or gonorrhea. Ask your health care provider if you are at risk.  If you do not have HIV, but are at risk, it may be recommended that you take a prescription medicine daily to prevent HIV infection. This is called pre-exposure prophylaxis (PrEP). You are considered at risk if: ? You are sexually active and do not regularly  use condoms or know the HIV status of your partner(s). ? You take drugs by injection. ? You are sexually active with a partner who has HIV.  Talk with your health care provider about whether you are at high risk of being infected with HIV. If you choose to begin PrEP, you should first be tested for HIV. You should then be tested every 3 months for as long as you are taking PrEP. Pregnancy  If you are premenopausal  and you may become pregnant, ask your health care provider about preconception counseling.  If you may become pregnant, take 400 to 800 micrograms (mcg) of folic acid every day.  If you want to prevent pregnancy, talk to your health care provider about birth control (contraception). Osteoporosis and menopause  Osteoporosis is a disease in which the bones lose minerals and strength with aging. This can result in serious bone fractures. Your risk for osteoporosis can be identified using a bone density scan.  If you are 98 years of age or older, or if you are at risk for osteoporosis and fractures, ask your health care provider if you should be screened.  Ask your health care provider whether you should take a calcium or vitamin D supplement to lower your risk for osteoporosis.  Menopause may have certain physical symptoms and risks.  Hormone replacement therapy may reduce some of these symptoms and risks. Talk to your health care provider about whether hormone replacement therapy is right for you. Follow these instructions at home:  Schedule regular health, dental, and eye exams.  Stay current with your immunizations.  Do not use any tobacco products including cigarettes, chewing tobacco, or electronic cigarettes.  If you are pregnant, do not drink alcohol.  If you are breastfeeding, limit how much and how often you drink alcohol.  Limit alcohol intake to no more than 1 drink per day for nonpregnant women. One drink equals 12 ounces of beer, 5 ounces of wine, or 1 ounces  of hard liquor.  Do not use street drugs.  Do not share needles.  Ask your health care provider for help if you need support or information about quitting drugs.  Tell your health care provider if you often feel depressed.  Tell your health care provider if you have ever been abused or do not feel safe at home. This information is not intended to replace advice given to you by your health care provider. Make sure you discuss any questions you have with your health care provider. Document Released: 09/19/2010 Document Revised: 08/12/2015 Document Reviewed: 12/08/2014 Elsevier Interactive Patient Education  [employer].

## 2016-11-29 NOTE — Assessment & Plan Note (Signed)
Controlled on flovent and refilled albuterol today.

## 2016-11-29 NOTE — Telephone Encounter (Signed)
See notes

## 2016-11-29 NOTE — Assessment & Plan Note (Signed)
BMI 37 but complicated making it morbid obesity. She is taking phentermine which is refilled today. She is pursuing weight loss procedure and letter given today documenting weight loss journey.

## 2016-11-29 NOTE — Progress Notes (Signed)
   Subjective:    Patient ID: Jade Mathis, female    DOB: [birth year], 41 y.o.   MRN: [id number]  HPI The patient is a 41 YO female coming in for wellness. Is pursuing weight loss surgery for her health. Has 70 week old and dulla helping with care.   PMH, [facility], social history reviewed and updated.   Review of Systems  Constitutional: Positive for activity change and fatigue. Negative for appetite change, chills and unexpected weight change.  HENT: Negative.   Eyes: Negative.   Respiratory: Negative for cough, chest tightness and shortness of breath.   Cardiovascular: Negative for chest pain, palpitations and leg swelling.  Gastrointestinal: Positive for constipation. Negative for abdominal distention, abdominal pain, diarrhea, nausea and vomiting.  Genitourinary: Positive for menstrual problem and vaginal bleeding.  Musculoskeletal: Negative.   Skin: Negative.   Neurological: Negative.   Hematological: Negative.       Objective:   Physical Exam  Constitutional: She is oriented to person, place, and time. She appears well-developed and well-nourished.  Obese  HENT:  Head: Normocephalic and atraumatic.  Eyes: EOM are normal.  Neck: Normal range of motion.  Cardiovascular: Normal rate and regular rhythm.   Pulmonary/Chest: Effort normal and breath sounds normal. No respiratory distress. She has no wheezes. She has no rales.  Abdominal: Soft. Bowel sounds are normal. She exhibits no distension. There is no tenderness. There is no rebound.  Musculoskeletal: She exhibits no edema.  Neurological: She is alert and oriented to person, place, and time. Coordination abnormal.  Slow gait and cane for ambulation  Skin: Skin is warm and dry.  Psychiatric: She has a normal mood and affect.   Vitals:   11/29/16 0758  BP: 130/90  Pulse: 80  Temp: 98.4 F (36.9 C)  TempSrc: Oral  SpO2: 99%  Weight: 234 lb (106.1 kg)  Height: 5' 6.5" (1.689 m)      Assessment & Plan:  Flu shot given at  visit

## 2016-11-29 NOTE — Assessment & Plan Note (Signed)
Flu shot given at visit, gyn does pap smear which should be up to date (done in pregnancy). Not exercising but diet is good. Counseled about sun safety and mole surveillance as well as dangers of distracted driving. Given screening recommendations.

## 2016-11-30 ENCOUNTER — Encounter: Payer: Self-pay | Admitting: Pulmonary Disease

## 2016-11-30 ENCOUNTER — Ambulatory Visit (INDEPENDENT_AMBULATORY_CARE_PROVIDER_SITE_OTHER): Payer: Medicare Other | Admitting: Family Medicine

## 2016-11-30 ENCOUNTER — Encounter: Payer: Self-pay | Admitting: Family Medicine

## 2016-11-30 VITALS — BP 120/74 | HR 77 | Ht 66.0 in | Wt 234.4 lb

## 2016-11-30 DIAGNOSIS — Z30433 Encounter for removal and reinsertion of intrauterine contraceptive device: Secondary | ICD-10-CM | POA: Diagnosis not present

## 2016-11-30 DIAGNOSIS — Z30019 Encounter for initial prescription of contraceptives, unspecified: Secondary | ICD-10-CM

## 2016-11-30 MED ORDER — LEVONORGESTREL 20 MCG/24HR IU IUD
INTRAUTERINE_SYSTEM | Freq: Once | INTRAUTERINE | Status: AC
  Administered 2016-11-30: 14:00:00 via INTRAUTERINE

## 2016-11-30 NOTE — Patient Instructions (Signed)
Levonorgestrel intrauterine device (IUD) What is this medicine? LEVONORGESTREL IUD (LEE voe nor jes trel) is a contraceptive (birth control) device. The device is placed inside the uterus by a healthcare professional. It is used to prevent pregnancy. This device can also be used to treat heavy bleeding that occurs during your period. This medicine may be used for other purposes; ask your health care provider or pharmacist if you have questions. COMMON BRAND NAME(S): Kyleena, LILETTA, Mirena, Skyla What should I tell my health care provider before I take this medicine? They need to know if you have any of these conditions: -abnormal Pap smear -cancer of the breast, uterus, or cervix -diabetes -endometritis -genital or pelvic infection now or in the past -have more than one sexual partner or your partner has more than one partner -heart disease -history of an ectopic or tubal pregnancy -immune system problems -IUD in place -liver disease or tumor -problems with blood clots or take blood-thinners -seizures -use intravenous drugs -uterus of unusual shape -vaginal bleeding that has not been explained -an unusual or allergic reaction to levonorgestrel, other hormones, silicone, or polyethylene, medicines, foods, dyes, or preservatives -pregnant or trying to get pregnant -breast-feeding How should I use this medicine? This device is placed inside the uterus by a health care professional. Talk to your pediatrician regarding the use of this medicine in children. Special care may be needed. Overdosage: If you think you have taken too much of this medicine contact a poison control center or emergency room at once. NOTE: This medicine is only for you. Do not share this medicine with others. What if I miss a dose? This does not apply. Depending on the brand of device you have inserted, the device will need to be replaced every 3 to 5 years if you wish to continue using this type of birth  control. What may interact with this medicine? Do not take this medicine with any of the following medications: -amprenavir -bosentan -fosamprenavir This medicine may also interact with the following medications: -aprepitant -armodafinil -barbiturate medicines for inducing sleep or treating seizures -bexarotene -boceprevir -griseofulvin -medicines to treat seizures like carbamazepine, ethotoin, felbamate, oxcarbazepine, phenytoin, topiramate -modafinil -pioglitazone -rifabutin -rifampin -rifapentine -some medicines to treat HIV infection like atazanavir, efavirenz, indinavir, lopinavir, nelfinavir, tipranavir, ritonavir -St. John's wort -warfarin This list may not describe all possible interactions. Give your health care provider a list of all the medicines, herbs, non-prescription drugs, or dietary supplements you use. Also tell them if you smoke, drink alcohol, or use illegal drugs. Some items may interact with your medicine. What should I watch for while using this medicine? Visit your doctor or health care professional for regular check ups. See your doctor if you or your partner has sexual contact with others, becomes HIV positive, or gets a sexual transmitted disease. This product does not protect you against HIV infection (AIDS) or other sexually transmitted diseases. You can check the placement of the IUD yourself by reaching up to the top of your vagina with clean fingers to feel the threads. Do not pull on the threads. It is a good habit to check placement after each menstrual period. Call your doctor right away if you feel more of the IUD than just the threads or if you cannot feel the threads at all. The IUD may come out by itself. You may become pregnant if the device comes out. If you notice that the IUD has come out use a backup birth control method like condoms and call your   health care provider. Using tampons will not change the position of the IUD and are okay to use  during your period. This IUD can be safely scanned with magnetic resonance imaging (MRI) only under specific conditions. Before you have an MRI, tell your healthcare provider that you have an IUD in place, and which type of IUD you have in place. What side effects may I notice from receiving this medicine? Side effects that you should report to your doctor or health care professional as soon as possible: -allergic reactions like skin rash, itching or hives, swelling of the face, lips, or tongue -fever, flu-like symptoms -genital sores -high blood pressure -no menstrual period for 6 weeks during use -pain, swelling, warmth in the leg -pelvic pain or tenderness -severe or sudden headache -signs of pregnancy -stomach cramping -sudden shortness of breath -trouble with balance, talking, or walking -unusual vaginal bleeding, discharge -yellowing of the eyes or skin Side effects that usually do not require medical attention (report to your doctor or health care professional if they continue or are bothersome): -acne -breast pain -change in sex drive or performance -changes in weight -cramping, dizziness, or faintness while the device is being inserted -headache -irregular menstrual bleeding within first 3 to 6 months of use -nausea This list may not describe all possible side effects. Call your doctor for medical advice about side effects. You may report side effects to FDA at 1-800-FDA-1088. Where should I keep my medicine? This does not apply. NOTE: This sheet is a summary. It may not cover all possible information. If you have questions about this medicine, talk to your doctor, pharmacist, or health care provider.  2018 Elsevier/Gold Standard (2015-12-17 14:14:56)  

## 2016-11-30 NOTE — Telephone Encounter (Signed)
VS please see below email from pt- please advise if pt needs to be seen in clinic for sx clearance.  Thanks!    ------------------------ Good Morning, how can I get records from my home Sleep Study while I was pragnant a few months ago.also I'm getting ready for Bariatric Surgery is my sleep apnea to severe or can I be cleared to do so. Do I need to make appointment with you?Thank You

## 2016-11-30 NOTE — Progress Notes (Signed)
   Subjective: Patient here for IUD change.  Has been in for 3 wks and she has been bleeding. She is quite unhappy to have received a Liletta instead of a Mirena. She is demanding that she have the IUD replaced..   Objective: Vitals:   11/30/16 1358  BP: 120/74  Pulse: 77    Patient identified, informed consent performed, signed copy in chart, time out was performed Procedure: Speculum placed inside vagina.  Cervix visualized.  Strings grasped with ring forceps.  IUD removed intact.  Procedure: Cervix visualized.  Cleaned with Betadine x 2.  Grasped anteriourly with a single tooth tenaculum.  Uterus sounded to 10 cm.  Mirena IUD placed per manufacturer's recommendations.  Strings trimmed to 3 cm.   Patient given post procedure instructions and Mirena care card with expiration date.  Patient is asked to check IUD strings periodically and follow up in 4-6 weeks for IUD check.  Impression: Encounter for female birth control - Plan: levonorgestrel (MIRENA) 20 MCG/24HR IUD   Plan: Continue IUD for 5 years.

## 2016-12-06 ENCOUNTER — Ambulatory Visit (INDEPENDENT_AMBULATORY_CARE_PROVIDER_SITE_OTHER): Payer: Medicare Other | Admitting: Neurology

## 2016-12-06 ENCOUNTER — Encounter: Payer: Self-pay | Admitting: Neurology

## 2016-12-06 VITALS — BP 120/70 | HR 88 | Ht 66.5 in | Wt 229.1 lb

## 2016-12-06 DIAGNOSIS — Z79899 Other long term (current) drug therapy: Secondary | ICD-10-CM | POA: Diagnosis not present

## 2016-12-06 DIAGNOSIS — G7000 Myasthenia gravis without (acute) exacerbation: Secondary | ICD-10-CM

## 2016-12-06 MED ORDER — AZATHIOPRINE 50 MG PO TABS: 150.0000 mg | ORAL_TABLET | Freq: Every day | ORAL | 3 refills | Status: DC

## 2016-12-06 NOTE — Progress Notes (Signed)
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   History of present illness: She was diagnosed with myasthenia gravis in Jul 02, 1995 with positive RNS and serology testing manifesting with weakness, double vision, ptosis, dysphagia and dysarthria. The same year, she underwent thymectomy with post-operative period complicated by respiratory failure requiring ICU stay and plasmapharesis. Around the same time, prednisone and mestinon was initiated. At some point, she was started on azathioprine. Azathioprine was stopped in 07/02/2002 because of an unplanned pregnancy and she remained on prednisone 20mg  until 07/01/2009 when she had an exacerbation requiring out-patient PLEX. In 2009-07-01, Imuran was restarted (dose unknown). She been on prednisone since Jul 02, 1995 and the lowest dose is her current dose at 10mg  daily. Cellcept was tried but she developed a rash so it was stopped.   The patient has previously been evaluated at [facility] by Dr. Truman Hayward 07/02/2010), discharged from [facility], and then established care with Dr. Jacelyn Grip at [facility] in April 2013. From 12/2011 - 07/2012, she was evaluated by Dr. Jannifer Franklin at [facility] in [facility]. When she initially saw Dr. Jacelyn Grip in April 2013, she was only on prednisone 10mg  and mestinon 60mg  QID and demonstrated clinical signs of weakness, therefore Imuran was restarted.  There was also a huge overlay of depression which started after her father's death in 07-02-11 when he died within a month of being diagnosed with lung cancer diagnosis. She sees psychiatry and behavorial therapist.  She also started  taking INH 300mg  and vitamin B6 25mg  for positive PPD.    In 07/02/2014, she was doing well and able to reduce her prednisone to 20mg  every other day.  In June 2016, she was hospitalized for probable MG exacerbation associated with GI illness and completed 3 treatments of IVIG.  Patient continued to have persistent dysarthria so an additional 2-days of IVIG was completed at home in early July.   In July 02, 2015, she was doing great at her follow-up visit in May and down to prednisone 10mg  daily.  However, within a week she returned to the clinic with delusional thoughts, confusion, and depression.   She presented here on 08/26/2015 without a scheduled appointment demanding to have her blood checked for "poison".  She was very delusional, claiming her husband was having an affair and the woman was doing witchcraft - see telephone note for details. She also had unwanted pregnancy and had a miscarriage on 9/13.    She is seeing Dr. Sima Matas [facility] [place].    UPDATE 04/14/2016:  Since she was last here, she has become [redacted] weeks pregnant and appropriately discontinued Imuran.  She takes prednisone 10mg  daily and mestinon 60mg  TID and has been doing well from MG standpoint.  She is very tired today because she has difficulty sleeping comfortably and also is dealing with morning sickness.  No new neurological complaints.    UPDATE 06/06/2016:  Patient was hospitalized last week with abodminal pain and cramping, and is doing better now.  Last week, she received a call for an attorney about some personal matters and she became very anxious, depressed, crying all day, and felt like her swallowing was getting more difficult.  She called to schedule a return  visit because she felt that her MG was getting worse.  Today, she is looking great and denies any problems.  She is aware that her symptoms last week were due to stress.  She brings a letter from two of her therapists outlining her PTSD and how it affected her in  2016.  Patient requesting to have this scanned into her medical chart.    She is now 6 months pregnant with her daughter.  UPDATE 08/16/2016:  She is here for routine follow-up appointment and appears very depressed and tearful at times. She expresses being tired, fatigued, and stressed all the time.  She denies any new MG-symptoms, but feels like it is getting worse.  In the past, she has often mistaken true myasthenia exacerbation from stressed-induced fatigue and depression.  She admits to being very stressed because she wants to be induced early as the pregnancy is making her extremely tired.  She also expresses a lot of frustration and stress related to her attorney.  She has not been seeing her therapist regularly.  She denies any double vision, difficulty swallowing/talking, or limb weakness.  UPDATE 12/06/2016:  She is here for follow-up visit and to discuss tapering her prednisone in order to have bariatric surgery.  She delivered a baby girl in August and restarted azathioprine 150mg  daily, and continues to take prednisone 10mg  and mestinon 60mg  TID.  She is not breast feeding.  She is upset today because I would recommend medication changes to her prednisone via MyChart message and asked for her to be evaluated in the clinic, despite explaining that I had not seen her in 4 months and needed to establish whether it was safe to reduce her prednisone, given that she has never been able to taper below 10mg  since her diagnosis in 1997.  Medications:  Current Outpatient Prescriptions on File Prior to Visit  Medication Sig Dispense Refill  . ACCU-CHEK SOFTCLIX LANCETS lancets 1 each by Other route 4 (four) times daily. Use as instructed 100 each 7  . albuterol (PROVENTIL HFA;VENTOLIN HFA) 108 (90 Base) MCG/ACT inhaler Inhale 2 puffs into the lungs every 6 (six) hours as needed for wheezing or shortness of breath. 1 Inhaler 2  . albuterol (PROVENTIL) (2.5 MG/3ML) 0.083% nebulizer solution Take 3 mLs (2.5  mg total) by nebulization every 4 (four) hours as needed for wheezing or shortness of breath. 75 mL 6  . butalbital-acetaminophen-caffeine (FIORICET, ESGIC) 50-325-40 MG tablet Take 1-2 tablets by mouth 2 (two) times daily as needed for headache. 30 tablet 0  . cetirizine (ZYRTEC) 10 MG tablet Take 1 tablet (10 mg total) by mouth daily. 30 tablet 11  . docusate sodium (COLACE) 100 MG capsule Take 1 capsule (100 mg total) by mouth 2 (two) times daily. 60 capsule 11  . escitalopram (LEXAPRO) 10 MG tablet Take 1 tablet (10 mg total) by mouth daily. 7 tablet 0  . Exenatide ER (BYDUREON BCISE) 2 MG/0.85ML AUIJ Inject 2 mg into the skin once a week. 4 pen 2  . Fe Cbn-Fe Gluc-FA-B12-C-DSS (FERRALET 90) 90-1 MG TABS Take 1 tablet by mouth daily. 90 each 3  . feeding supplement, GLUCERNA SHAKE, (GLUCERNA SHAKE) LIQD Take 237 mLs by mouth 3 (three) times daily between meals. 90 Can 11  . FLOVENT HFA 110 MCG/ACT inhaler INHALE 1 PUFF INTO THE LUNGS 2 (TWO) TIMES DAILY. 12 Inhaler 0  . fluticasone (FLOVENT HFA) 110 MCG/ACT inhaler Inhale 1 puff into the lungs 2 (two) times daily. 1 Inhaler 3  .  glucose blood (ACCU-CHEK GUIDE) test strip Use to check blood sugar 3 times per day. Dx code E11.9 200 each 2  . HUMALOG KWIKPEN 100 UNIT/ML KiwkPen 22 units AC Breakfast/36 AC Lunch/36 AC Dinner 30 mL 5  . ibuprofen (IBU) 800 MG tablet Take 1 tablet (800 mg total) by mouth every 8 (eight) hours as needed. 30 tablet 0  . Insulin Detemir (LEVEMIR) 100 UNIT/ML Pen 40 units in the morning and 110 units at bedtime. 15 mL 11  . Insulin NPH, Human,, Isophane, (HUMULIN N KWIKPEN) 100 UNIT/ML Kiwkpen 45 units SQ at bedtime 15 mL 2  . Insulin Syringes, Disposable, U-100 1 ML MISC 1 Device by Does not apply route as needed. 60 each 12  . levothyroxine (SYNTHROID, LEVOTHROID) 150 MCG tablet Take 1 tablet (150 mcg total) by mouth daily. 30 tablet 3  . metFORMIN (GLUCOPHAGE-XR) 500 MG 24 hr tablet TAKE 4 TABLETS BY MOUTH EVERY DAY  WITH SUPPER 120 tablet 3  . phentermine (ADIPEX-P) 37.5 MG tablet Take 1 tablet (37.5 mg total) by mouth daily with breakfast. 30 tablet 2  . predniSONE (DELTASONE) 10 MG tablet TAKE 1 TABLET (10 MG TOTAL) BY MOUTH DAILY. 60 tablet 3  . Prenatal Vit-Fe Fum-FA-Omega (PRENATAL MULTI +DHA) 27-0.8-228 MG CAPS Take 1 capsule by mouth daily. 30 capsule 10  . pyridostigmine (MESTINON) 60 MG tablet Take 1 tablet (60 mg total) by mouth 3 (three) times daily. 270 tablet 3  . QUEtiapine (SEROQUEL) 100 MG tablet Take 1 tablet (100 mg total) by mouth at bedtime. 7 tablet 0  . valACYclovir (VALTREX) 500 MG tablet Take 1 tablet (500 mg total) by mouth 2 (two) times daily. 60 tablet 2   No current facility-administered medications on file prior to visit.     Allergies:  Allergies  Allergen Reactions  . Depo-Provera [Medroxyprogesterone] Other (See Comments)    Reaction:  Headaches   . Vicodin [Hydrocodone-Acetaminophen] Nausea Only    Review of Systems:  CONSTITUTIONAL: No fevers, chills, night sweats EYES: No visual changes or eye pain ENT: No hearing changes.  No history of nose bleeds.   RESPIRATORY: No cough, wheezing, shortness of breath.   CARDIOVASCULAR: No chest pain, and palpitations.   GI: Negative for abdominal discomfort, blood in stools or black stools.      GU:  No history of incontinence.   MUSCLOSKELETAL: + joint pain or swelling.  No myalgias.   SKIN: Negative for lesions, rash, and itching.   HEMATOLOGY/ONCOLOGY: Negative for prolonged bleeding, bruising easily, and swollen nodes.  No history of cancer.   ENDOCRINE: Negative for cold or heat intolerance, polydipsia or goiter.   PSYCH:  + depression or anxiety symptoms.   NEURO: As Above.   Vital Signs:  BP 120/70   Pulse 88   Ht 5' 6.5" (1.689 m)   Wt 229 lb 1 oz (103.9 kg)   LMP  (LMP Unknown)   SpO2 96%   BMI 36.42 kg/m   General:  Depressed appearing, no eye contact, paucity of speech, does not answer questions when  asked    Neurological Exam: MENTAL STATUS:  Awake, oriented.  She does not engage in conversation.  CRANIAL NERVES: Pupils are round and reactive to light. Extraocular movements are intact. Face is symmetric.  There is no ptosis.  No facial weakness.  MOTOR:  Motor strength is continues to show intact strength 5/5, with evidence of give-way weakness.  RLE show mild hip flexion weakness (old).  GAIT:  She is  walking assisted with cane, slow slightly antalgic  Lab Results  Component Value Date   HGBA1C 6.9 (H) 11/03/2016   Lab Results  Component Value Date   TSH 0.46 11/03/2016     IMPRESSION: Seropositive generalized myasthenia gravis (diagnosed 1997) s/p thymectomy.  Previously hospitalized for MG exacerbation in 1997 (PLEX), 2011 (PLEX), 2016 (IVIG) Clinically stable, but continues to feel that she is not well and myasthenia is not optimized with medication.  I explained to patient that if she is feeling unwell, it is not appropriate timing to reduce her prednisone < 10mg  as she is at risk for exacerbation.  She has never been on prednisone < 10mg  since her diagnosis in 1997 and when I attempted to reduce the dose, she became symptomatic.  Therefore, I do not recommend that her prednisone is tapered until, she feels well enough.  Again, I have stressed the importance of understanding whether her fatigue is stemming from depression or myasthenia, because each are treated differently.    At this juncture, I recommend that she continue prednisone 10mg  daily and contact my office if she is feeling well enough to reduce to 9mg /d. Continue azathrioprine 150mg /d and mestinon 60mg  TID.  Because of her dissatisfaction with my care, I offered patient to establish care for myasthenia gravis with another neurologist as it is in the best interest of the patient to feel comfortable and trust her provider.    She is welcome to return to clinic in 4 months, if she chooses to continue her care with  me.  Greater than 50% of this 30 minute visit was spent in counseling, explanation of diagnosis, planning of further management, and coordination of care due to the.    Thank you for allowing me to participate in patient's care.  If I can answer any additional questions, I would be pleased to do so.    Sincerely,    Trevionne Advani K. Posey Pronto, DO

## 2016-12-06 NOTE — Patient Instructions (Addendum)
Continue prednisone 10mg  daily  Continue azathioprine 150mg  daily  Continue mestinon 60mg  three times daily  Check labs in October  Return to clinic 4 months

## 2016-12-11 ENCOUNTER — Encounter: Payer: Self-pay | Admitting: Endocrinology

## 2016-12-11 ENCOUNTER — Ambulatory Visit (INDEPENDENT_AMBULATORY_CARE_PROVIDER_SITE_OTHER): Payer: Medicare Other | Admitting: Endocrinology

## 2016-12-11 VITALS — BP 136/78 | HR 89 | Ht 66.5 in | Wt 233.4 lb

## 2016-12-11 DIAGNOSIS — Z794 Long term (current) use of insulin: Secondary | ICD-10-CM

## 2016-12-11 DIAGNOSIS — E1165 Type 2 diabetes mellitus with hyperglycemia: Secondary | ICD-10-CM | POA: Diagnosis not present

## 2016-12-11 MED ORDER — DULAGLUTIDE 0.75 MG/0.5ML ~~LOC~~ SOAJ
SUBCUTANEOUS | 0 refills | Status: DC
Start: 2016-12-11 — End: 2017-01-04

## 2016-12-11 MED ORDER — INSULIN DEGLUDEC 200 UNIT/ML ~~LOC~~ SOPN: 110.0000 [IU] | PEN_INJECTOR | Freq: Every day | SUBCUTANEOUS | 1 refills | Status: DC

## 2016-12-11 NOTE — Patient Instructions (Addendum)
CONFIRM CURRENT DOSES  More sugars after supper  Check blood sugars on waking up  3x weekly  Also check blood sugars about 2 hours after a meal and do this after different meals by rotation  Recommended blood sugar levels on waking up is 90-130 and about 2 hours after meal is 130-160  Please bring your blood sugar monitor to each visit, thank you  Change LEVEMIR TO Tyler Aas:  Start with 100 units once daily in pm  Adjust every 3 days by 4 units  Trulicity replaces [employer]
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
## 2016-12-12 ENCOUNTER — Encounter: Payer: Self-pay | Admitting: Pulmonary Disease

## 2016-12-12 NOTE — Telephone Encounter (Signed)
Dr. Halford Chessman,   Please advise if it is ok to write a clearance letter for Ms. Ferrari. She has a history of sleep apnea and wants to proceed with bariatric surgery. Thanks!

## 2016-12-13 ENCOUNTER — Ambulatory Visit: Payer: Self-pay | Admitting: Pulmonary Disease

## 2016-12-13 NOTE — Telephone Encounter (Signed)
Per pt Dr.Walsh in [place] with [facility] will be performing surgery. Contact number is [phone]

## 2016-12-13 NOTE — Telephone Encounter (Signed)
Please get the contact information for her bariatric surgeon and I can write a letter.

## 2016-12-14 NOTE — Telephone Encounter (Signed)
Pt verified her mailing address - would like a copy of the surgery clearance letter mailed to her in addition to the one sent to the surgeon Will complete once letter is complete Routing to VS

## 2016-12-15 ENCOUNTER — Encounter: Payer: Self-pay | Admitting: Pulmonary Disease

## 2016-12-15 NOTE — Telephone Encounter (Signed)
I have completed and signed letter.  Please fax to Dr. Norris Cross office with Organ.  Letter is in my office.

## 2016-12-20 DIAGNOSIS — G4733 Obstructive sleep apnea (adult) (pediatric): Secondary | ICD-10-CM | POA: Diagnosis not present

## 2016-12-22 DIAGNOSIS — M5412 Radiculopathy, cervical region: Secondary | ICD-10-CM | POA: Diagnosis not present

## 2016-12-22 DIAGNOSIS — M5431 Sciatica, right side: Secondary | ICD-10-CM | POA: Diagnosis not present

## 2016-12-22 DIAGNOSIS — Z79899 Other long term (current) drug therapy: Secondary | ICD-10-CM | POA: Diagnosis not present

## 2016-12-25 ENCOUNTER — Other Ambulatory Visit: Payer: Self-pay | Admitting: Endocrinology

## 2016-12-28 DIAGNOSIS — I10 Essential (primary) hypertension: Secondary | ICD-10-CM | POA: Diagnosis not present

## 2016-12-29 ENCOUNTER — Ambulatory Visit: Payer: Self-pay | Admitting: Neurology

## 2017-01-04 ENCOUNTER — Other Ambulatory Visit: Payer: Self-pay | Admitting: Endocrinology

## 2017-01-08 ENCOUNTER — Encounter: Payer: Self-pay | Admitting: Neurology

## 2017-01-09 ENCOUNTER — Telehealth: Payer: Self-pay | Admitting: Neurology

## 2017-01-09 ENCOUNTER — Encounter: Payer: Self-pay | Admitting: Neurology

## 2017-01-09 NOTE — Telephone Encounter (Signed)
This matter has been sent to [facility] per Dr. Posey Pronto.

## 2017-01-09 NOTE — Telephone Encounter (Signed)
PT called and wants to set up a phone call with Dr Posey Pronto

## 2017-01-17 ENCOUNTER — Other Ambulatory Visit: Payer: Self-pay

## 2017-01-17 DIAGNOSIS — Z713 Dietary counseling and surveillance: Secondary | ICD-10-CM | POA: Diagnosis not present

## 2017-01-18 ENCOUNTER — Other Ambulatory Visit: Payer: Self-pay | Admitting: Family Medicine

## 2017-01-19 ENCOUNTER — Ambulatory Visit: Payer: Self-pay | Admitting: Endocrinology

## 2017-01-20 DIAGNOSIS — G4733 Obstructive sleep apnea (adult) (pediatric): Secondary | ICD-10-CM | POA: Diagnosis not present

## 2017-01-23 DIAGNOSIS — M5412 Radiculopathy, cervical region: Secondary | ICD-10-CM | POA: Diagnosis not present

## 2017-01-23 DIAGNOSIS — R51 Headache: Secondary | ICD-10-CM | POA: Diagnosis not present

## 2017-01-23 DIAGNOSIS — Z79899 Other long term (current) drug therapy: Secondary | ICD-10-CM | POA: Diagnosis not present

## 2017-01-24 ENCOUNTER — Other Ambulatory Visit: Payer: Self-pay

## 2017-01-24 DIAGNOSIS — Z713 Dietary counseling and surveillance: Secondary | ICD-10-CM | POA: Diagnosis not present

## 2017-01-26 ENCOUNTER — Ambulatory Visit: Payer: Self-pay | Admitting: Endocrinology

## 2017-01-30 DIAGNOSIS — Z9225 Personal history of immunosupression therapy: Secondary | ICD-10-CM | POA: Diagnosis not present

## 2017-01-30 DIAGNOSIS — I10 Essential (primary) hypertension: Secondary | ICD-10-CM | POA: Diagnosis not present

## 2017-01-30 DIAGNOSIS — E119 Type 2 diabetes mellitus without complications: Secondary | ICD-10-CM | POA: Diagnosis not present

## 2017-02-07 ENCOUNTER — Other Ambulatory Visit: Payer: Self-pay | Admitting: Obstetrics & Gynecology

## 2017-02-07 ENCOUNTER — Ambulatory Visit: Payer: Medicare Other | Admitting: Podiatry

## 2017-02-19 DIAGNOSIS — G4733 Obstructive sleep apnea (adult) (pediatric): Secondary | ICD-10-CM | POA: Diagnosis not present

## 2017-02-22 DIAGNOSIS — M5412 Radiculopathy, cervical region: Secondary | ICD-10-CM | POA: Diagnosis not present

## 2017-02-22 DIAGNOSIS — Z79899 Other long term (current) drug therapy: Secondary | ICD-10-CM | POA: Diagnosis not present

## 2017-02-22 DIAGNOSIS — R51 Headache: Secondary | ICD-10-CM | POA: Diagnosis not present

## 2017-02-22 DIAGNOSIS — N910 Primary amenorrhea: Secondary | ICD-10-CM | POA: Diagnosis not present

## 2017-02-28 ENCOUNTER — Other Ambulatory Visit: Payer: Self-pay | Admitting: Obstetrics & Gynecology

## 2017-02-28 DIAGNOSIS — O0992 Supervision of high risk pregnancy, unspecified, second trimester: Secondary | ICD-10-CM

## 2017-03-06 ENCOUNTER — Other Ambulatory Visit: Payer: Self-pay | Admitting: Internal Medicine

## 2017-03-06 ENCOUNTER — Encounter: Payer: Self-pay | Admitting: Internal Medicine

## 2017-03-07 ENCOUNTER — Ambulatory Visit: Payer: Self-pay | Admitting: Family

## 2017-03-08 ENCOUNTER — Ambulatory Visit: Payer: Self-pay | Admitting: Internal Medicine

## 2017-03-08 ENCOUNTER — Telehealth: Payer: Self-pay | Admitting: Internal Medicine

## 2017-03-08 ENCOUNTER — Other Ambulatory Visit: Payer: Self-pay | Admitting: Endocrinology

## 2017-03-08 NOTE — Telephone Encounter (Signed)
Copied from [place] [id number]. Topic: Quick Communication - See Telephone Encounter >> Mar 08, 2017  1:28 PM Boyd Kerbs wrote: CRM for notification. See Telephone encounter for:  Phentermine HCl 37.5 mg Oral Daily with breakfast  Pt. Lost her brother and just could not make it in.  Asking to see if can have a prescription refill.  Made an appt. For 1/11 for med. Check.  03/08/17.

## 2017-03-08 NOTE — Telephone Encounter (Signed)
Per previous documentation on 03/06/17, provider states patient needs an appointment before the medication will be refilled. / Called patient and advised to keep her upcoming appointment for med recheck. / Patient insists that Dr. Sharlet Salina Call her back about this medication and a refill. / Patient wants to have prescription called in now because her brother has passed away, and she doesn't have time to come in for an appointment because she is dealing with lots of issues right now on top of the fact that her brother has passed. /

## 2017-03-09 ENCOUNTER — Ambulatory Visit: Payer: Self-pay | Admitting: Internal Medicine

## 2017-03-09 ENCOUNTER — Encounter: Payer: Self-pay | Admitting: Internal Medicine

## 2017-03-09 NOTE — Telephone Encounter (Signed)
Called pt to move up appt sooner. Made appt for this [facility] 03/12/17 @ 8:15...Jade Mathis

## 2017-03-09 NOTE — Telephone Encounter (Signed)
This is a non-emergent issue and will need review at visit to determine if refill appropriate.

## 2017-03-09 NOTE — Telephone Encounter (Signed)
Pt also sent mychart msg replied back w/MD response below...Jade Mathis

## 2017-03-12 ENCOUNTER — Encounter: Payer: Self-pay | Admitting: Internal Medicine

## 2017-03-12 ENCOUNTER — Ambulatory Visit: Payer: Self-pay | Admitting: Internal Medicine

## 2017-03-12 ENCOUNTER — Ambulatory Visit (INDEPENDENT_AMBULATORY_CARE_PROVIDER_SITE_OTHER): Payer: Medicare Other | Admitting: Internal Medicine

## 2017-03-12 DIAGNOSIS — J4530 Mild persistent asthma, uncomplicated: Secondary | ICD-10-CM | POA: Diagnosis not present

## 2017-03-12 MED ORDER — PHENTERMINE HCL 37.5 MG PO TABS
37.5000 mg | ORAL_TABLET | Freq: Every day | ORAL | 0 refills | Status: DC
Start: 2017-03-12 — End: 2017-04-17

## 2017-03-12 MED ORDER — FLUTICASONE PROPIONATE HFA 110 MCG/ACT IN AERO: 1.0000 | INHALATION_SPRAY | Freq: Two times a day (BID) | RESPIRATORY_TRACT | 6 refills | Status: DC

## 2017-03-12 MED ORDER — ALBUTEROL SULFATE HFA 108 (90 BASE) MCG/ACT IN AERS: 2.0000 | INHALATION_SPRAY | Freq: Four times a day (QID) | RESPIRATORY_TRACT | 2 refills | Status: DC | PRN

## 2017-03-12 MED ORDER — FERROUS GLUCONATE 324 (38 FE) MG PO TABS: 324.0000 mg | ORAL_TABLET | Freq: Every day | ORAL | 3 refills | Status: DC

## 2017-03-12 NOTE — Patient Instructions (Addendum)
We will give the month of the phentermine until the surgery.   We will send in the refill of the others and iron.

## 2017-03-12 NOTE — Assessment & Plan Note (Signed)
Weight down about 10 pounds on phentermine and we agree to do 1 more month until her surgery but no more given she has been on it 4 months already and educated her about the risk of long term usage.

## 2017-03-12 NOTE — Progress Notes (Signed)
   Subjective:    Patient ID: Jade Mathis, female    DOB: 28-Mar-1975, 41 y.o.   MRN: [id number]  HPI The patient is a 41 YO female coming in for weight management. She has been taking phentermine for about 4 months in a row now and has done courses several times in the past 2 years. She was requesting refill and was asked to make a visit. She does not think she should have to come for visit. Is doing fine with phentermine and wants to continue. She denies palpitations or headache. Weight is down about 10 pounds in the lats 3 months. Is having weight loss surgery in about 1 month and wants to continue until that time to help with her progress. Is working on diet changes, not able to exercise much due to her chronic health problems.   Review of Systems  Constitutional: Positive for fatigue. Negative for activity change, appetite change and unexpected weight change.  Respiratory: Negative for cough, chest tightness and shortness of breath.   Cardiovascular: Negative for chest pain, palpitations and leg swelling.  Gastrointestinal: Negative for abdominal distention, abdominal pain, constipation, diarrhea, nausea and vomiting.  Musculoskeletal: Negative.   Skin: Negative.   Neurological: Negative.       Objective:   Physical Exam  Constitutional: She is oriented to person, place, and time. She appears well-developed and well-nourished.  Overweight  HENT:  Head: Normocephalic and atraumatic.  Eyes: EOM are normal.  Neck: Normal range of motion.  Cardiovascular: Normal rate and regular rhythm.  Pulmonary/Chest: Effort normal and breath sounds normal. No respiratory distress. She has no wheezes. She has no rales.  Abdominal: Soft.  Musculoskeletal: She exhibits no edema.  Neurological: She is alert and oriented to person, place, and time. Coordination normal.  Skin: Skin is warm and dry.   Vitals:   03/12/17 0819  BP: 130/82  Pulse: 89  Temp: (!) 97.5 F (36.4 C)  TempSrc: Oral  SpO2:  99%  Weight: 223 lb (101.2 kg)  Height: 5' 6.5" (1.689 m)      Assessment & Plan:

## 2017-03-21 DIAGNOSIS — E119 Type 2 diabetes mellitus without complications: Secondary | ICD-10-CM | POA: Diagnosis not present

## 2017-03-21 DIAGNOSIS — Z79899 Other long term (current) drug therapy: Secondary | ICD-10-CM | POA: Diagnosis not present

## 2017-03-21 DIAGNOSIS — I10 Essential (primary) hypertension: Secondary | ICD-10-CM | POA: Diagnosis not present

## 2017-03-21 DIAGNOSIS — Z7951 Long term (current) use of inhaled steroids: Secondary | ICD-10-CM | POA: Diagnosis not present

## 2017-03-21 DIAGNOSIS — K2950 Unspecified chronic gastritis without bleeding: Secondary | ICD-10-CM | POA: Diagnosis not present

## 2017-03-21 DIAGNOSIS — E785 Hyperlipidemia, unspecified: Secondary | ICD-10-CM | POA: Diagnosis not present

## 2017-03-21 DIAGNOSIS — J45909 Unspecified asthma, uncomplicated: Secondary | ICD-10-CM | POA: Diagnosis not present

## 2017-03-21 DIAGNOSIS — G4733 Obstructive sleep apnea (adult) (pediatric): Secondary | ICD-10-CM | POA: Diagnosis not present

## 2017-03-21 DIAGNOSIS — R198 Other specified symptoms and signs involving the digestive system and abdomen: Secondary | ICD-10-CM | POA: Diagnosis not present

## 2017-03-21 DIAGNOSIS — M064 Inflammatory polyarthropathy: Secondary | ICD-10-CM | POA: Diagnosis not present

## 2017-03-21 DIAGNOSIS — Z01818 Encounter for other preprocedural examination: Secondary | ICD-10-CM | POA: Diagnosis not present

## 2017-03-21 DIAGNOSIS — Z794 Long term (current) use of insulin: Secondary | ICD-10-CM | POA: Diagnosis not present

## 2017-03-21 DIAGNOSIS — K219 Gastro-esophageal reflux disease without esophagitis: Secondary | ICD-10-CM | POA: Diagnosis not present

## 2017-03-21 DIAGNOSIS — G7000 Myasthenia gravis without (acute) exacerbation: Secondary | ICD-10-CM | POA: Diagnosis not present

## 2017-03-21 DIAGNOSIS — Z7952 Long term (current) use of systemic steroids: Secondary | ICD-10-CM | POA: Diagnosis not present

## 2017-03-21 DIAGNOSIS — E039 Hypothyroidism, unspecified: Secondary | ICD-10-CM | POA: Diagnosis not present

## 2017-03-21 DIAGNOSIS — M5030 Other cervical disc degeneration, unspecified cervical region: Secondary | ICD-10-CM | POA: Diagnosis not present

## 2017-03-22 DIAGNOSIS — G4733 Obstructive sleep apnea (adult) (pediatric): Secondary | ICD-10-CM | POA: Diagnosis not present

## 2017-03-22 DIAGNOSIS — E119 Type 2 diabetes mellitus without complications: Secondary | ICD-10-CM | POA: Diagnosis not present

## 2017-03-22 DIAGNOSIS — Z01818 Encounter for other preprocedural examination: Secondary | ICD-10-CM | POA: Diagnosis not present

## 2017-03-22 DIAGNOSIS — I10 Essential (primary) hypertension: Secondary | ICD-10-CM | POA: Diagnosis not present

## 2017-03-22 DIAGNOSIS — G4737 Central sleep apnea in conditions classified elsewhere: Secondary | ICD-10-CM | POA: Diagnosis not present

## 2017-03-23 ENCOUNTER — Other Ambulatory Visit: Payer: Self-pay | Admitting: Endocrinology

## 2017-03-23 ENCOUNTER — Ambulatory Visit: Payer: Self-pay | Admitting: *Deleted

## 2017-03-23 DIAGNOSIS — M5412 Radiculopathy, cervical region: Secondary | ICD-10-CM | POA: Diagnosis not present

## 2017-03-23 NOTE — Telephone Encounter (Signed)
Patient is calling with multiple complaints- she states she has had high blood sugars- 255, 300. She states her BP is 136/92. She states she fell Monday and hit her head and has a knot on her ear an hurt her knee. She states she has surgery scheduled and needs to get her BP under control. She is slow to answer questions and states she just had a baby 5 months ago and had post partum eclampsia and could not control her diabetes throughout the pregnancy.Stopped triage questions when the patient could not tell me if she was type 1-2 diabetic. When asked where she was- she stated she was at her neighbor's house and her neighbor was in the bathroom. She stated her nephew was on the way. Asked her to get someone on the phone and it took several minutes for her to get someone- her nephew finally arrived and agreed to call 911. Reason for Disposition . Acting confused (e.g., disoriented, slurred speech)  Answer Assessment - Initial Assessment Questions 1. BLOOD GLUCOSE: "What is your blood glucose level?"      255 this is fasting 2. ONSET: "When did you check the blood glucose?"     today 3. USUAL RANGE: "What is your glucose level usually?" (e.g., usual fasting morning value, usual evening value)     300 yesterday- patient had baby 5 months ago- she has not been in control - she has had hypertentension 4. KETONES: "Do you check for ketones (urine or blood test strips)?" If yes, ask: "What does the test show now?"      n/a 5. TYPE 1 or 2:  "Do you know what type of diabetes you have?"  (e.g., Type 1, Type 2, Gestational; doesn't know)      Type 1 6. INSULIN: "Do you take insulin?" If yes, ask: "Have you missed any shots recently?"     Insulin/oral 7. DIABETES PILLS: "Do you take any pills for your diabetes?" If yes, ask: "Have you missed taking any pills recently?"     *No Answer* 8. OTHER SYMPTOMS: "Do you have any symptoms?" (e.g., fever, frequent urination, difficulty breathing, dizziness, weakness,  vomiting)     *No Answer* 9. PREGNANCY: "Is there any chance you are pregnant?" "When was your last menstrual period?"     *No Answer*  Protocols used: DIABETES - HIGH BLOOD SUGAR-A-AH

## 2017-03-23 NOTE — Telephone Encounter (Signed)
Noted  

## 2017-03-26 ENCOUNTER — Ambulatory Visit: Payer: Self-pay | Admitting: Neurology

## 2017-03-30 ENCOUNTER — Ambulatory Visit: Payer: Self-pay | Admitting: Internal Medicine

## 2017-03-30 ENCOUNTER — Other Ambulatory Visit: Payer: Self-pay

## 2017-03-30 ENCOUNTER — Other Ambulatory Visit: Payer: Self-pay | Admitting: Family Medicine

## 2017-03-30 DIAGNOSIS — O24319 Unspecified pre-existing diabetes mellitus in pregnancy, unspecified trimester: Secondary | ICD-10-CM
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
## 2017-04-02 ENCOUNTER — Ambulatory Visit: Payer: Self-pay | Admitting: Endocrinology

## 2017-04-09 DIAGNOSIS — M797 Fibromyalgia: Secondary | ICD-10-CM | POA: Diagnosis not present

## 2017-04-09 DIAGNOSIS — E119 Type 2 diabetes mellitus without complications: Secondary | ICD-10-CM | POA: Diagnosis not present

## 2017-04-09 DIAGNOSIS — I10 Essential (primary) hypertension: Secondary | ICD-10-CM | POA: Diagnosis not present

## 2017-04-09 DIAGNOSIS — E782 Mixed hyperlipidemia: Secondary | ICD-10-CM | POA: Diagnosis not present

## 2017-04-09 DIAGNOSIS — E0500 Thyrotoxicosis with diffuse goiter without thyrotoxic crisis or storm: Secondary | ICD-10-CM | POA: Diagnosis not present

## 2017-04-09 DIAGNOSIS — M1990 Unspecified osteoarthritis, unspecified site: Secondary | ICD-10-CM | POA: Diagnosis not present

## 2017-04-09 DIAGNOSIS — Z794 Long term (current) use of insulin: Secondary | ICD-10-CM | POA: Diagnosis not present

## 2017-04-09 DIAGNOSIS — E039 Hypothyroidism, unspecified: Secondary | ICD-10-CM | POA: Diagnosis not present

## 2017-04-09 DIAGNOSIS — G4730 Sleep apnea, unspecified: Secondary | ICD-10-CM | POA: Diagnosis not present

## 2017-04-09 DIAGNOSIS — Z79899 Other long term (current) drug therapy: Secondary | ICD-10-CM | POA: Diagnosis not present

## 2017-04-09 DIAGNOSIS — M5030 Other cervical disc degeneration, unspecified cervical region: Secondary | ICD-10-CM | POA: Diagnosis not present

## 2017-04-09 DIAGNOSIS — G43909 Migraine, unspecified, not intractable, without status migrainosus: Secondary | ICD-10-CM | POA: Diagnosis not present

## 2017-04-09 DIAGNOSIS — G8929 Other chronic pain: Secondary | ICD-10-CM | POA: Diagnosis not present

## 2017-04-09 DIAGNOSIS — K2950 Unspecified chronic gastritis without bleeding: Secondary | ICD-10-CM | POA: Diagnosis not present

## 2017-04-09 DIAGNOSIS — G7000 Myasthenia gravis without (acute) exacerbation: Secondary | ICD-10-CM | POA: Diagnosis not present

## 2017-04-09 DIAGNOSIS — E785 Hyperlipidemia, unspecified: Secondary | ICD-10-CM | POA: Diagnosis not present

## 2017-04-09 DIAGNOSIS — M549 Dorsalgia, unspecified: Secondary | ICD-10-CM | POA: Diagnosis not present

## 2017-04-09 DIAGNOSIS — G500 Trigeminal neuralgia: Secondary | ICD-10-CM | POA: Diagnosis not present

## 2017-04-09 DIAGNOSIS — J45909 Unspecified asthma, uncomplicated: Secondary | ICD-10-CM | POA: Diagnosis not present

## 2017-04-09 HISTORY — PX: BARIATRIC SURGERY: SHX1103

## 2017-04-11 ENCOUNTER — Encounter: Payer: Self-pay | Admitting: Internal Medicine

## 2017-04-13 MED ORDER — SIMETHICONE 40 MG/0.6ML PO SUSP
80.00 | ORAL | Status: DC
Start: ? — End: 2017-04-13

## 2017-04-13 MED ORDER — PANTOPRAZOLE SODIUM 40 MG PO TBEC
40.00 | DELAYED_RELEASE_TABLET | ORAL | Status: DC
Start: 2017-04-11 — End: 2017-04-13

## 2017-04-13 MED ORDER — GENERIC EXTERNAL MEDICATION
Status: DC
Start: 2017-04-11 — End: 2017-04-13

## 2017-04-13 MED ORDER — ONDANSETRON HCL 4 MG PO TABS
4.00 | ORAL_TABLET | ORAL | Status: DC
Start: ? — End: 2017-04-13

## 2017-04-13 MED ORDER — GENERIC EXTERNAL MEDICATION
Status: DC
Start: ? — End: 2017-04-13

## 2017-04-13 MED ORDER — QUETIAPINE FUMARATE 100 MG PO TABS
100.00 | ORAL_TABLET | ORAL | Status: DC
Start: 2017-04-11 — End: 2017-04-13

## 2017-04-13 MED ORDER — ACETAMINOPHEN 325 MG PO TABS
650.00 | ORAL_TABLET | ORAL | Status: DC
Start: 2017-04-11 — End: 2017-04-13

## 2017-04-13 MED ORDER — CLOTRIMAZOLE 10 MG MT LOZG
LOZENGE | OROMUCOSAL | Status: DC
Start: ? — End: 2017-04-13

## 2017-04-13 MED ORDER — NALOXONE HCL 0.4 MG/ML IJ SOLN
.40 | INTRAMUSCULAR | Status: DC
Start: ? — End: 2017-04-13

## 2017-04-13 MED ORDER — CLONIDINE HCL 0.1 MG PO TABS
0.10 | ORAL_TABLET | ORAL | Status: DC
Start: ? — End: 2017-04-13

## 2017-04-13 MED ORDER — BENZOCAINE-MENTHOL 15-3.6 MG MT LOZG
LOZENGE | OROMUCOSAL | Status: DC
Start: ? — End: 2017-04-13

## 2017-04-13 MED ORDER — ONDANSETRON HCL 4 MG/2ML IJ SOLN
8.00 | INTRAMUSCULAR | Status: DC
Start: ? — End: 2017-04-13

## 2017-04-13 MED ORDER — POTASSIUM CHLORIDE IN NACL 20-0.9 MEQ/L-% IV SOLN
INTRAVENOUS | Status: DC
Start: ? — End: 2017-04-13

## 2017-04-13 MED ORDER — CELECOXIB 200 MG PO CAPS
200.00 | ORAL_CAPSULE | ORAL | Status: DC
Start: 2017-04-11 — End: 2017-04-13

## 2017-04-13 MED ORDER — INSULIN LISPRO 100 UNIT/ML ~~LOC~~ SOLN
SUBCUTANEOUS | Status: DC
Start: 2017-04-11 — End: 2017-04-13

## 2017-04-13 MED ORDER — HYDROMORPHONE HCL 1 MG/ML IJ SOLN
.50 | INTRAMUSCULAR | Status: DC
Start: ? — End: 2017-04-13

## 2017-04-13 MED ORDER — INSULIN LISPRO 100 UNIT/ML ~~LOC~~ SOLN
SUBCUTANEOUS | Status: DC
Start: ? — End: 2017-04-13

## 2017-04-13 MED ORDER — PREDNISONE 10 MG PO TABS
5.00 | ORAL_TABLET | ORAL | Status: DC
Start: 2017-04-12 — End: 2017-04-13

## 2017-04-13 MED ORDER — HYDRALAZINE HCL 25 MG PO TABS
25.00 | ORAL_TABLET | ORAL | Status: DC
Start: ? — End: 2017-04-13

## 2017-04-13 MED ORDER — INSULIN GLARGINE 100 UNIT/ML ~~LOC~~ SOLN
SUBCUTANEOUS | Status: DC
Start: 2017-04-11 — End: 2017-04-13

## 2017-04-13 MED ORDER — ALBUTEROL SULFATE (2.5 MG/3ML) 0.083% IN NEBU
2.50 | INHALATION_SOLUTION | RESPIRATORY_TRACT | Status: DC
Start: ? — End: 2017-04-13

## 2017-04-13 MED ORDER — HEPARIN SODIUM (PORCINE) 5000 UNIT/ML IJ SOLN
INTRAMUSCULAR | Status: DC
Start: 2017-04-11 — End: 2017-04-13

## 2017-04-13 MED ORDER — SODIUM CHLORIDE 0.9 % IV SOLN
1000.00 | INTRAVENOUS | Status: DC
Start: ? — End: 2017-04-13

## 2017-04-13 MED ORDER — SUCRALFATE 1 GM/10ML PO SUSP
1.00 | ORAL | Status: DC
Start: 2017-04-11 — End: 2017-04-13

## 2017-04-13 MED ORDER — SODIUM CHLORIDE 0.9 % IV SOLN
INTRAVENOUS | Status: DC
Start: ? — End: 2017-04-13

## 2017-04-13 MED ORDER — GABAPENTIN 250 MG/5ML PO SOLN
125.00 | ORAL | Status: DC
Start: 2017-04-11 — End: 2017-04-13

## 2017-04-13 MED ORDER — PYRIDOSTIGMINE BROMIDE 60 MG PO TABS
60.00 | ORAL_TABLET | ORAL | Status: DC
Start: 2017-04-11 — End: 2017-04-13

## 2017-04-17 ENCOUNTER — Encounter: Payer: Self-pay | Admitting: Internal Medicine

## 2017-04-17 ENCOUNTER — Ambulatory Visit (INDEPENDENT_AMBULATORY_CARE_PROVIDER_SITE_OTHER): Payer: Medicare Other | Admitting: Internal Medicine

## 2017-04-17 DIAGNOSIS — G7000 Myasthenia gravis without (acute) exacerbation: Secondary | ICD-10-CM

## 2017-04-17 DIAGNOSIS — E669 Obesity, unspecified: Secondary | ICD-10-CM | POA: Diagnosis not present

## 2017-04-17 DIAGNOSIS — F5104 Psychophysiologic insomnia: Secondary | ICD-10-CM

## 2017-04-17 DIAGNOSIS — J4530 Mild persistent asthma, uncomplicated: Secondary | ICD-10-CM | POA: Diagnosis not present

## 2017-04-17 DIAGNOSIS — E1169 Type 2 diabetes mellitus with other specified complication: Secondary | ICD-10-CM | POA: Diagnosis not present

## 2017-04-17 DIAGNOSIS — F331 Major depressive disorder, recurrent, moderate: Secondary | ICD-10-CM

## 2017-04-17 MED ORDER — VITAMIN D3 50 MCG (2000 UT) PO CAPS: 2000.0000 [IU] | ORAL_CAPSULE | Freq: Two times a day (BID) | ORAL | 6 refills | Status: DC

## 2017-04-17 MED ORDER — FLUCONAZOLE 150 MG PO TABS
150.0000 mg | ORAL_TABLET | ORAL | 0 refills | Status: DC
Start: 2017-04-17 — End: 2017-06-27

## 2017-04-17 MED ORDER — ALBUTEROL SULFATE (2.5 MG/3ML) 0.083% IN NEBU: 2.5000 mg | INHALATION_SOLUTION | RESPIRATORY_TRACT | 6 refills | Status: DC | PRN

## 2017-04-17 MED ORDER — PREDNISONE 10 MG PO TABS: 10.0000 mg | ORAL_TABLET | Freq: Every day | ORAL | 0 refills | Status: DC

## 2017-04-17 MED ORDER — TRAZODONE HCL 100 MG PO TABS: 100.0000 mg | ORAL_TABLET | Freq: Every day | ORAL | 1 refills | Status: DC

## 2017-04-17 NOTE — Assessment & Plan Note (Signed)
Taking mestinon and imuran but not controlled. Currently without flare but with worsening symptoms off prednisone. Will resume prednisone 10 mg daily (prior dosing) and ask her to keep visit with neurology next week for adjustment of long term regimen if needed. If they feel it is safe for her to try off prednisone will need taper gradual given her long term usage of this. Patient is informed that she needs to get advice prior to stopping prednisone in the future.

## 2017-04-17 NOTE — Assessment & Plan Note (Signed)
She needs to return to psych and referral placed today although she is informed that she can contact anywhere and go there. She is taking seroquel 200 mg daily and will add trazodone at night today as sleep is her big concern. Some of this may be medical. Explained to her that since she is not in menopause it is not recommended to give her a hormonal patch and I do not think that this will help with her mood at all.

## 2017-04-17 NOTE — Progress Notes (Signed)
Subjective:    Patient ID: Jade Mathis, female    DOB: [birth year], 42 y.o.   MRN: [id number]  HPI  The patient is a 42 YO female coming in for hospital follow up (in for bariatric surgery) and is doing okay after surgery. She is really struggling with her mental health since she is not able to eat. She is still on liquid diet. Cannot advance for another week. She is drinking liquids only and sipping small amounts throughout the day. She is taking the metformin 4 pills daily. She is not taking any injections including the bydrueon or trulicity or insulin injections. She is having low sugars down to 70 which is why she is off these medications. She is doing liquids only still and sipping every 15 minutes all day long. Soup, jello, powerade, gatorade and water. Weight is down about 10-15 pounds since surgery.  She is concerned that her prednisone is stopped for her myasthenia gravis and she is having a lot of tiredness and cannot do repeated actions. She has almost no energy at home. She has a small child and needs 3 people to help her with this. She is still taking the mestinon. Has a visit with neurology in the next 2 weeks. She had been on the prednisone daily for last 10+ years. They told her to stop and so she has been off for about 1 week now.  She is also having more problems with mood and sleeping. She has not been to her psychiatrist in some time and is taking seroquel 200 mg at night time and this is not working. She states that her weight loss doctor told her to get a hormone patch from [place] to help with her mood. She was told that she needed it. She denies SI/HI but is having some changes. Not sleeping well at all.   Review of Systems  Constitutional: Positive for activity change, appetite change, chills and fatigue. Negative for unexpected weight change.  HENT: Negative.   Eyes: Negative.   Respiratory: Positive for shortness of breath. Negative for cough and chest tightness.    Stable, chronic  Cardiovascular: Negative for chest pain, palpitations and leg swelling.  Gastrointestinal: Positive for abdominal pain. Negative for abdominal distention, constipation, diarrhea, nausea and vomiting.  Musculoskeletal: Positive for arthralgias, back pain and myalgias.  Skin: Negative.   Neurological: Positive for weakness. Negative for dizziness, seizures, facial asymmetry, speech difficulty and numbness.  Psychiatric/Behavioral: Positive for decreased concentration, dysphoric mood and sleep disturbance. Negative for agitation, behavioral problems, self-injury and suicidal ideas. The patient is not nervous/anxious and is not hyperactive.       Objective:   Physical Exam  Constitutional: She is oriented to person, place, and time. She appears well-developed and well-nourished.  HENT:  Head: Normocephalic and atraumatic.  Eyes: EOM are normal.  Neck: Normal range of motion.  Cardiovascular: Normal rate and regular rhythm.  Pulmonary/Chest: Effort normal and breath sounds normal. No respiratory distress. She has no wheezes. She has no rales.  Abdominal: Soft. Bowel sounds are normal. She exhibits no distension. There is tenderness. There is no rebound.  Defers exam of wound.   Musculoskeletal: She exhibits no edema.  Neurological: She is alert and oriented to person, place, and time. Coordination abnormal.  Cane for ambulation  Skin: Skin is warm and dry.  Psychiatric: She has a normal mood and affect.   Vitals:   04/17/17 1032  BP: 122/90  Pulse: 76  Temp: 98.1 F (36.7 C)  TempSrc: Oral  SpO2: 99%  Weight: 212 lb (96.2 kg)  Height: 5' 6.5" (1.689 m)      Assessment & Plan:

## 2017-04-17 NOTE — Assessment & Plan Note (Signed)
Add trazodone to seroquel but referred to psychiatry for long term management of her depression and insomnia as she is reminded that I will not prescribe her seroquel.

## 2017-04-17 NOTE — Assessment & Plan Note (Signed)
Suspect her lower sugars are twofold. She is not taking prednisone abruptly. This will be resumed as she should not stop abruptly and is having worsening of her MG and likely adrenal insufficiency in the light of abrupt cessation. Also she is losing weight and diet is changed. She will continue metformin 2000 mg daily and continue to hold her bydureon (trulicity, she was not sure which) and her insulins. She is asked to check sugars in the morning and after meals to help monitor her sugars. If they elevated she needs to call for advice about medications. She will also continue to follow with endo.

## 2017-04-17 NOTE — Patient Instructions (Addendum)
We will get you in with the psychiatrist to see if they can adjust the medication for the mood and the sleep. We have sent in a new medicine for the sleep trazodone. Take 1 pill of the trazodone with the seroquel at night time for the sleep.   You need to start taking the prednisone again as you cannot stop this medication without going off slowly. Start taking 1 pill prednisone a day again and talk to the neurologist about if it is safe for you to go off the medication. They may want to stop the medicine slowly.   We will have you watch the sugars as they go up again slowly from getting back on the prednisone. If your sugars are >250 in the morning add back the once weekly bydureon to help. If this does not help call us back or Dr. Dwyane Dee to ask which insulin to start back taking.   We have sent in diflucan to take for yeast infection. Take 1 pill today, then 1 pill on Friday, then 1 pill on Monday. If you are still having problems after that call us back.   We have sent in the vitamin D to take 1 pill twice a day.

## 2017-04-17 NOTE — Assessment & Plan Note (Signed)
Weight is decreasing after her surgery. She is still on liquid diet. Rx for vitamin D per her request. She is advised of the need for daily vitamin.

## 2017-04-17 NOTE — Assessment & Plan Note (Signed)
Stable, refilled albuterol nebulizer and continue flovent. She will go back on her prednisone for MG which will likely help breathing as well.

## 2017-04-18 ENCOUNTER — Other Ambulatory Visit: Payer: Self-pay | Admitting: Neurology

## 2017-04-18 ENCOUNTER — Other Ambulatory Visit: Payer: Self-pay | Admitting: Obstetrics & Gynecology

## 2017-04-18 NOTE — Telephone Encounter (Signed)
[place] and changed Rx to 3 month supply with no refills until patient has a follow up appointment.

## 2017-04-22 ENCOUNTER — Emergency Department (HOSPITAL_COMMUNITY): Payer: Medicare Other

## 2017-04-22 ENCOUNTER — Emergency Department (HOSPITAL_COMMUNITY): Payer: Medicare Other | Admitting: Certified Registered"

## 2017-04-22 ENCOUNTER — Encounter (HOSPITAL_COMMUNITY): Payer: Self-pay | Admitting: Certified Registered"

## 2017-04-22 ENCOUNTER — Other Ambulatory Visit: Payer: Self-pay

## 2017-04-22 ENCOUNTER — Encounter (HOSPITAL_COMMUNITY)
Admission: EM | Disposition: A | Discharge status: Other Acute Inpatient Hospital | Payer: Self-pay | Source: Home / Self Care | Attending: Emergency Medicine

## 2017-04-22 ENCOUNTER — Emergency Department (HOSPITAL_COMMUNITY)
Admission: EM | Admit: 2017-04-22 | Discharge: 2017-04-22 | Payer: Medicare Other | Attending: Emergency Medicine | Admitting: Emergency Medicine

## 2017-04-22 DIAGNOSIS — K91870 Postprocedural hematoma of a digestive system organ or structure following a digestive system procedure: Secondary | ICD-10-CM | POA: Diagnosis not present

## 2017-04-22 DIAGNOSIS — G4733 Obstructive sleep apnea (adult) (pediatric): Secondary | ICD-10-CM | POA: Insufficient documentation

## 2017-04-22 DIAGNOSIS — R42 Dizziness and giddiness: Secondary | ICD-10-CM | POA: Diagnosis not present

## 2017-04-22 DIAGNOSIS — Z7951 Long term (current) use of inhaled steroids: Secondary | ICD-10-CM | POA: Diagnosis not present

## 2017-04-22 DIAGNOSIS — E2740 Unspecified adrenocortical insufficiency: Secondary | ICD-10-CM | POA: Insufficient documentation

## 2017-04-22 DIAGNOSIS — Z7952 Long term (current) use of systemic steroids: Secondary | ICD-10-CM | POA: Insufficient documentation

## 2017-04-22 DIAGNOSIS — Z9884 Bariatric surgery status: Secondary | ICD-10-CM | POA: Insufficient documentation

## 2017-04-22 DIAGNOSIS — Z794 Long term (current) use of insulin: Secondary | ICD-10-CM | POA: Diagnosis not present

## 2017-04-22 DIAGNOSIS — D1803 Hemangioma of intra-abdominal structures: Secondary | ICD-10-CM | POA: Diagnosis not present

## 2017-04-22 DIAGNOSIS — I959 Hypotension, unspecified: Secondary | ICD-10-CM | POA: Diagnosis not present

## 2017-04-22 DIAGNOSIS — G500 Trigeminal neuralgia: Secondary | ICD-10-CM | POA: Insufficient documentation

## 2017-04-22 DIAGNOSIS — G7000 Myasthenia gravis without (acute) exacerbation: Secondary | ICD-10-CM | POA: Diagnosis not present

## 2017-04-22 DIAGNOSIS — R51 Headache: Secondary | ICD-10-CM | POA: Diagnosis not present

## 2017-04-22 DIAGNOSIS — R269 Unspecified abnormalities of gait and mobility: Secondary | ICD-10-CM | POA: Diagnosis not present

## 2017-04-22 DIAGNOSIS — R404 Transient alteration of awareness: Secondary | ICD-10-CM | POA: Diagnosis not present

## 2017-04-22 DIAGNOSIS — Z4682 Encounter for fitting and adjustment of non-vascular catheter: Secondary | ICD-10-CM | POA: Diagnosis not present

## 2017-04-22 DIAGNOSIS — T794XXA Traumatic shock, initial encounter: Secondary | ICD-10-CM | POA: Diagnosis not present

## 2017-04-22 DIAGNOSIS — R5383 Other fatigue: Secondary | ICD-10-CM | POA: Diagnosis not present

## 2017-04-22 DIAGNOSIS — D62 Acute posthemorrhagic anemia: Secondary | ICD-10-CM | POA: Insufficient documentation

## 2017-04-22 DIAGNOSIS — I10 Essential (primary) hypertension: Secondary | ICD-10-CM | POA: Diagnosis not present

## 2017-04-22 DIAGNOSIS — J45909 Unspecified asthma, uncomplicated: Secondary | ICD-10-CM | POA: Diagnosis not present

## 2017-04-22 DIAGNOSIS — Y838 Other surgical procedures as the cause of abnormal reaction of the patient, or of later complication, without mention of misadventure at the time of the procedure: Secondary | ICD-10-CM | POA: Insufficient documentation

## 2017-04-22 DIAGNOSIS — G894 Chronic pain syndrome: Secondary | ICD-10-CM | POA: Diagnosis not present

## 2017-04-22 DIAGNOSIS — F419 Anxiety disorder, unspecified: Secondary | ICD-10-CM | POA: Insufficient documentation

## 2017-04-22 DIAGNOSIS — M79609 Pain in unspecified limb: Secondary | ICD-10-CM | POA: Diagnosis not present

## 2017-04-22 DIAGNOSIS — E119 Type 2 diabetes mellitus without complications: Secondary | ICD-10-CM | POA: Diagnosis not present

## 2017-04-22 DIAGNOSIS — R109 Unspecified abdominal pain: Secondary | ICD-10-CM | POA: Diagnosis not present

## 2017-04-22 DIAGNOSIS — Z888 Allergy status to other drugs, medicaments and biological substances status: Secondary | ICD-10-CM | POA: Insufficient documentation

## 2017-04-22 DIAGNOSIS — K661 Hemoperitoneum: Secondary | ICD-10-CM | POA: Diagnosis not present

## 2017-04-22 DIAGNOSIS — R578 Other shock: Secondary | ICD-10-CM

## 2017-04-22 DIAGNOSIS — K219 Gastro-esophageal reflux disease without esophagitis: Secondary | ICD-10-CM | POA: Diagnosis not present

## 2017-04-22 DIAGNOSIS — E039 Hypothyroidism, unspecified: Secondary | ICD-10-CM | POA: Diagnosis not present

## 2017-04-22 DIAGNOSIS — Z79899 Other long term (current) drug therapy: Secondary | ICD-10-CM | POA: Insufficient documentation

## 2017-04-22 DIAGNOSIS — Z885 Allergy status to narcotic agent status: Secondary | ICD-10-CM | POA: Insufficient documentation

## 2017-04-22 DIAGNOSIS — T8119XA Other postprocedural shock, initial encounter: Secondary | ICD-10-CM | POA: Diagnosis not present

## 2017-04-22 DIAGNOSIS — F319 Bipolar disorder, unspecified: Secondary | ICD-10-CM | POA: Insufficient documentation

## 2017-04-22 DIAGNOSIS — M797 Fibromyalgia: Secondary | ICD-10-CM | POA: Insufficient documentation

## 2017-04-22 DIAGNOSIS — R58 Hemorrhage, not elsewhere classified: Secondary | ICD-10-CM | POA: Diagnosis not present

## 2017-04-22 DIAGNOSIS — L7622 Postprocedural hemorrhage and hematoma of skin and subcutaneous tissue following other procedure: Secondary | ICD-10-CM | POA: Diagnosis not present

## 2017-04-22 DIAGNOSIS — E1165 Type 2 diabetes mellitus with hyperglycemia: Secondary | ICD-10-CM | POA: Diagnosis not present

## 2017-04-22 HISTORY — PX: LAPAROTOMY: SHX154

## 2017-04-22 LAB — HEMOGLOBIN AND HEMATOCRIT, BLOOD
HCT: 30 % — ABNORMAL LOW (ref 36.0–46.0)
Hemoglobin: 9.5 g/dL — ABNORMAL LOW (ref 12.0–15.0)

## 2017-04-22 LAB — URINALYSIS, ROUTINE W REFLEX MICROSCOPIC
Bilirubin Urine: NEGATIVE
Glucose, UA: NEGATIVE mg/dL
Ketones, ur: 5 mg/dL — AB
Leukocytes, UA: NEGATIVE
Nitrite: NEGATIVE
Protein, ur: NEGATIVE mg/dL
Specific Gravity, Urine: 1.033 — ABNORMAL HIGH (ref 1.005–1.030)
pH: 5 (ref 5.0–8.0)

## 2017-04-22 LAB — CBC
HCT: 32.9 % — ABNORMAL LOW (ref 36.0–46.0)
Hemoglobin: 10.6 g/dL — ABNORMAL LOW (ref 12.0–15.0)
MCH: 26.7 pg (ref 26.0–34.0)
MCHC: 32.2 g/dL (ref 30.0–36.0)
MCV: 82.9 fL (ref 78.0–100.0)
Platelets: 314 10*3/uL (ref 150–400)
RBC: 3.97 MIL/uL (ref 3.87–5.11)
RDW: 13.9 % (ref 11.5–15.5)
WBC: 6.6 10*3/uL (ref 4.0–10.5)

## 2017-04-22 LAB — HEPATIC FUNCTION PANEL
ALT: 11 U/L — ABNORMAL LOW (ref 14–54)
AST: 23 U/L (ref 15–41)
Albumin: 3.4 g/dL — ABNORMAL LOW (ref 3.5–5.0)
Alkaline Phosphatase: 68 U/L (ref 38–126)
Bilirubin, Direct: 0.2 mg/dL (ref 0.1–0.5)
Indirect Bilirubin: 0.8 mg/dL (ref 0.3–0.9)
Total Bilirubin: 1 mg/dL (ref 0.3–1.2)
Total Protein: 6.4 g/dL — ABNORMAL LOW (ref 6.5–8.1)

## 2017-04-22 LAB — I-STAT BETA HCG BLOOD, ED (MC, WL, AP ONLY): I-stat hCG, quantitative: <5 m[IU]/mL (ref <5)

## 2017-04-22 LAB — BASIC METABOLIC PANEL
Anion gap: 7 (ref 5–15)
BUN: 12 mg/dL (ref 6–20)
CO2: 26 mmol/L (ref 22–32)
Calcium: 8.4 mg/dL — ABNORMAL LOW (ref 8.9–10.3)
Chloride: 104 mmol/L (ref 101–111)
Creatinine, Ser: 0.96 mg/dL (ref 0.44–1.00)
GFR calc Af Amer: >60 mL/min (ref >60)
GFR calc non Af Amer: >60 mL/min (ref >60)
Glucose, Bld: 187 mg/dL — ABNORMAL HIGH (ref 65–99)
Potassium: 3.4 mmol/L — ABNORMAL LOW (ref 3.5–5.1)
Sodium: 137 mmol/L (ref 135–145)

## 2017-04-22 LAB — LACTIC ACID, PLASMA: Lactic Acid, Venous: 1.1 mmol/L (ref 0.5–1.9)

## 2017-04-22 LAB — I-STAT CG4 LACTIC ACID, ED: Lactic Acid, Venous: 0.94 mmol/L (ref 0.5–1.9)

## 2017-04-22 LAB — PROTIME-INR
INR: 1.08
Prothrombin Time: 14 seconds (ref 11.4–15.2)

## 2017-04-22 SURGERY — LAPAROTOMY, EXPLORATORY
Anesthesia: General | Site: Abdomen

## 2017-04-22 MED ORDER — HYDROMORPHONE HCL 1 MG/ML IJ SOLN
INTRAMUSCULAR | Status: AC
  Filled 2017-04-22: qty 1

## 2017-04-22 MED ORDER — LABETALOL HCL 5 MG/ML IV SOLN
INTRAVENOUS | Status: DC | PRN
  Administered 2017-04-22 (×2): 2.5 mg via INTRAVENOUS

## 2017-04-22 MED ORDER — MIDAZOLAM HCL 2 MG/2ML IJ SOLN
INTRAMUSCULAR | Status: AC
  Filled 2017-04-22: qty 2

## 2017-04-22 MED ORDER — LIDOCAINE 2% (20 MG/ML) 5 ML SYRINGE
INTRAMUSCULAR | Status: DC | PRN
  Administered 2017-04-22: 100 mg via INTRAVENOUS

## 2017-04-22 MED ORDER — SODIUM CHLORIDE 0.9 % IV BOLUS (SEPSIS)
1000.0000 mL | Freq: Once | INTRAVENOUS | Status: AC
  Administered 2017-04-22: 1000 mL via INTRAVENOUS

## 2017-04-22 MED ORDER — MIDAZOLAM HCL 5 MG/5ML IJ SOLN
INTRAMUSCULAR | Status: DC | PRN
  Administered 2017-04-22: 2 mg via INTRAVENOUS

## 2017-04-22 MED ORDER — SUCCINYLCHOLINE CHLORIDE 200 MG/10ML IV SOSY
PREFILLED_SYRINGE | INTRAVENOUS | Status: DC | PRN
  Administered 2017-04-22: 160 mg via INTRAVENOUS

## 2017-04-22 MED ORDER — CEFAZOLIN SODIUM-DEXTROSE 2-3 GM-%(50ML) IV SOLR
INTRAVENOUS | Status: DC | PRN
  Administered 2017-04-22: 2 g via INTRAVENOUS

## 2017-04-22 MED ORDER — SODIUM CHLORIDE 0.9 % IV SOLN
INTRAVENOUS | Status: DC | PRN
  Administered 2017-04-22 (×2): via INTRAVENOUS

## 2017-04-22 MED ORDER — IOPAMIDOL (ISOVUE-300) INJECTION 61%
INTRAVENOUS | Status: AC
  Administered 2017-04-22: 100 mL
  Filled 2017-04-22: qty 100

## 2017-04-22 MED ORDER — PHENYLEPHRINE 40 MCG/ML (10ML) SYRINGE FOR IV PUSH (FOR BLOOD PRESSURE SUPPORT)
PREFILLED_SYRINGE | INTRAVENOUS | Status: DC | PRN
  Administered 2017-04-22: 80 ug via INTRAVENOUS

## 2017-04-22 MED ORDER — SUCCINYLCHOLINE CHLORIDE 200 MG/10ML IV SOSY
PREFILLED_SYRINGE | INTRAVENOUS | Status: AC
  Filled 2017-04-22: qty 10

## 2017-04-22 MED ORDER — MEPERIDINE HCL 25 MG/ML IJ SOLN: 6.2500 mg | INTRAMUSCULAR | Status: DC | PRN

## 2017-04-22 MED ORDER — CEFAZOLIN SODIUM-DEXTROSE 2-4 GM/100ML-% IV SOLN
INTRAVENOUS | Status: AC
  Filled 2017-04-22: qty 100

## 2017-04-22 MED ORDER — SUGAMMADEX SODIUM 500 MG/5ML IV SOLN
INTRAVENOUS | Status: DC | PRN
  Administered 2017-04-22: 500 mg via INTRAVENOUS

## 2017-04-22 MED ORDER — SODIUM CHLORIDE 0.9 % IV BOLUS (SEPSIS): 2000.0000 mL | Freq: Once | INTRAVENOUS | Status: DC

## 2017-04-22 MED ORDER — FENTANYL CITRATE (PF) 100 MCG/2ML IJ SOLN
INTRAMUSCULAR | Status: DC | PRN
  Administered 2017-04-22 (×2): 100 ug via INTRAVENOUS
  Administered 2017-04-22: 50 ug via INTRAVENOUS

## 2017-04-22 MED ORDER — ROCURONIUM BROMIDE 10 MG/ML (PF) SYRINGE
PREFILLED_SYRINGE | INTRAVENOUS | Status: AC
  Filled 2017-04-22: qty 5

## 2017-04-22 MED ORDER — ACETAMINOPHEN 10 MG/ML IV SOLN
INTRAVENOUS | Status: AC
  Filled 2017-04-22: qty 100

## 2017-04-22 MED ORDER — 0.9 % SODIUM CHLORIDE (POUR BTL) OPTIME
TOPICAL | Status: DC | PRN
  Administered 2017-04-22: 1000 mL
  Administered 2017-04-22: 4000 mL

## 2017-04-22 MED ORDER — HYDROCORTISONE NA SUCCINATE PF 100 MG IJ SOLR
100.0000 mg | Freq: Once | INTRAMUSCULAR | Status: AC
Start: 2017-04-22 — End: 2017-04-22
  Administered 2017-04-22: 100 mg via INTRAVENOUS
  Filled 2017-04-22: qty 2

## 2017-04-22 MED ORDER — ONDANSETRON HCL 4 MG/2ML IJ SOLN
INTRAMUSCULAR | Status: DC | PRN
  Administered 2017-04-22: 4 mg via INTRAVENOUS

## 2017-04-22 MED ORDER — HYDROMORPHONE HCL 1 MG/ML IJ SOLN: 0.2500 mg | INTRAMUSCULAR | Status: DC | PRN

## 2017-04-22 MED ORDER — METHOCARBAMOL 1000 MG/10ML IJ SOLN
500.0000 mg | Freq: Once | INTRAMUSCULAR | Status: AC
  Administered 2017-04-22: 500 mg via INTRAVENOUS
  Filled 2017-04-22: qty 550

## 2017-04-22 MED ORDER — FENTANYL CITRATE (PF) 250 MCG/5ML IJ SOLN
INTRAMUSCULAR | Status: AC
  Filled 2017-04-22: qty 5

## 2017-04-22 MED ORDER — SODIUM CHLORIDE 0.9 % IV SOLN: Freq: Once | INTRAVENOUS | Status: DC

## 2017-04-22 MED ORDER — PROMETHAZINE HCL 25 MG/ML IJ SOLN
6.2500 mg | INTRAMUSCULAR | Status: DC | PRN
  Filled 2017-04-22: qty 1

## 2017-04-22 MED ORDER — PROPOFOL 10 MG/ML IV BOLUS
INTRAVENOUS | Status: AC
  Filled 2017-04-22: qty 20

## 2017-04-22 MED ORDER — HYDROMORPHONE HCL 1 MG/ML IJ SOLN
0.2500 mg | INTRAMUSCULAR | Status: DC | PRN
  Administered 2017-04-22 (×5): 0.5 mg via INTRAVENOUS

## 2017-04-22 MED ORDER — ETOMIDATE 2 MG/ML IV SOLN
INTRAVENOUS | Status: AC
  Filled 2017-04-22: qty 10

## 2017-04-22 MED ORDER — LIDOCAINE 2% (20 MG/ML) 5 ML SYRINGE
INTRAMUSCULAR | Status: AC
  Filled 2017-04-22: qty 5

## 2017-04-22 MED ORDER — HYDRALAZINE HCL 20 MG/ML IJ SOLN
INTRAMUSCULAR | Status: DC | PRN
  Administered 2017-04-22: 5 mg via INTRAVENOUS

## 2017-04-22 MED ORDER — LACTATED RINGERS IV SOLN
INTRAVENOUS | Status: DC | PRN
  Administered 2017-04-22: 18:00:00 via INTRAVENOUS

## 2017-04-22 MED ORDER — SODIUM CHLORIDE 0.9 % IV SOLN
INTRAVENOUS | Status: DC | PRN
  Administered 2017-04-22: 18:00:00 via INTRAVENOUS

## 2017-04-22 MED ORDER — ROCURONIUM BROMIDE 10 MG/ML (PF) SYRINGE
PREFILLED_SYRINGE | INTRAVENOUS | Status: DC | PRN
  Administered 2017-04-22: 60 mg via INTRAVENOUS

## 2017-04-22 MED ORDER — ETOMIDATE 2 MG/ML IV SOLN
INTRAVENOUS | Status: DC | PRN
  Administered 2017-04-22: 16 mg via INTRAVENOUS

## 2017-04-22 MED ORDER — ACETAMINOPHEN 10 MG/ML IV SOLN
1000.0000 mg | Freq: Once | INTRAVENOUS | Status: AC
  Administered 2017-04-22: 1000 mg via INTRAVENOUS

## 2017-04-22 SURGICAL SUPPLY — 42 items
APPLICATOR COTTON TIP 6IN STRL (MISCELLANEOUS) IMPLANT
BLADE EXTENDED COATED 6.5IN (ELECTRODE) ×2 IMPLANT
BLADE HEX COATED 2.75 (ELECTRODE) ×2 IMPLANT
BLADE SURG SZ10 CARB STEEL (BLADE) IMPLANT
COVER MAYO STAND STRL (DRAPES) IMPLANT
COVER SURGICAL LIGHT HANDLE (MISCELLANEOUS) ×2 IMPLANT
DRAIN CHANNEL 19F RND (DRAIN) IMPLANT
DRAPE LAPAROSCOPIC ABDOMINAL (DRAPES) ×2 IMPLANT
DRAPE SHEET LG 3/4 BI-LAMINATE (DRAPES) IMPLANT
DRAPE WARM FLUID 44X44 (DRAPE) IMPLANT
DRSG OPSITE POSTOP 4X10 (GAUZE/BANDAGES/DRESSINGS) ×2 IMPLANT
DRSG OPSITE POSTOP 4X8 (GAUZE/BANDAGES/DRESSINGS) ×2 IMPLANT
DRSG PAD ABDOMINAL 8X10 ST (GAUZE/BANDAGES/DRESSINGS) IMPLANT
ELECT REM PT RETURN 15FT ADLT (MISCELLANEOUS) ×2 IMPLANT
EVACUATOR DRAINAGE 10X20 100CC (DRAIN) IMPLANT
EVACUATOR SILICONE 100CC (DRAIN)
GAUZE SPONGE 4X4 12PLY STRL (GAUZE/BANDAGES/DRESSINGS) ×2 IMPLANT
GLOVE BIO SURGEON STRL SZ 6 (GLOVE) ×6 IMPLANT
GLOVE BIOGEL PI IND STRL 6.5 (GLOVE) ×1 IMPLANT
GLOVE BIOGEL PI IND STRL 7.0 (GLOVE) ×1 IMPLANT
GLOVE BIOGEL PI INDICATOR 6.5 (GLOVE) ×1
GLOVE BIOGEL PI INDICATOR 7.0 (GLOVE) ×1
GLOVE INDICATOR 6.5 STRL GRN (GLOVE) ×4 IMPLANT
GOWN STRL REUS W/ TWL XL LVL3 (GOWN DISPOSABLE) ×3 IMPLANT
GOWN STRL REUS W/TWL 2XL LVL3 (GOWN DISPOSABLE) ×2 IMPLANT
GOWN STRL REUS W/TWL XL LVL3 (GOWN DISPOSABLE) ×6
HANDLE SUCTION POOLE (INSTRUMENTS) ×1 IMPLANT
HEMOSTAT SNOW SURGICEL 2X4 (HEMOSTASIS) ×4 IMPLANT
KIT BASIN OR (CUSTOM PROCEDURE TRAY) ×2 IMPLANT
LEGGING LITHOTOMY PAIR STRL (DRAPES) IMPLANT
NS IRRIG 1000ML POUR BTL (IV SOLUTION) ×4 IMPLANT
PACK GENERAL/GYN (CUSTOM PROCEDURE TRAY) ×2 IMPLANT
STAPLER VISISTAT 35W (STAPLE) IMPLANT
SUCTION POOLE HANDLE (INSTRUMENTS) ×2
SUCTION POOLE TIP (SUCTIONS) ×2 IMPLANT
SUT PDS AB 1 CTX 36 (SUTURE) IMPLANT
SUT VIC AB 2-0 SH 18 (SUTURE) ×2 IMPLANT
SUT VIC AB 3-0 SH 18 (SUTURE) ×2 IMPLANT
TOWEL OR 17X26 10 PK STRL BLUE (TOWEL DISPOSABLE) ×2 IMPLANT
TRAY FOLEY W/METER SILVER 14FR (SET/KITS/TRAYS/PACK) ×2 IMPLANT
TRAY FOLEY W/METER SILVER 16FR (SET/KITS/TRAYS/PACK) IMPLANT
YANKAUER SUCT BULB TIP NO VENT (SUCTIONS) ×2 IMPLANT
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
## 2017-04-22 NOTE — ED Notes (Signed)
Pt requests that no medical information be discussed with anyone but her husband. MD made aware

## 2017-04-22 NOTE — Transfer of Care (Signed)
Immediate Anesthesia Transfer of Care Note  Patient: Jade Mathis  Procedure(s) Performed: EXPLORATORY LAPAROTOMY, OVERSEWING OF STAPLE LINE, EVACUATION OF HEMAPERITONEUM (N/A Abdomen)  Patient Location: PACU  Anesthesia Type:General  Level of Consciousness: awake, alert  and patient cooperative  Airway & Oxygen Therapy: Patient Spontanous Breathing and Patient connected to face mask oxygen  Post-op Assessment: Report given to RN and Post -op Vital signs reviewed and stable  Post vital signs: Reviewed and stable  Last Vitals:  Vitals:   04/22/17 1910 04/22/17 1914  BP: (!) (P) 123/99   Pulse: (P) 86   Resp: (!) (P) 22   Temp: (P) 36.7 C 36.7 C  SpO2: (P) 100%     Last Pain:  Vitals:   04/22/17 1708  TempSrc: Oral  PainSc:          Complications: No apparent anesthesia complications

## 2017-04-22 NOTE — Anesthesia Procedure Notes (Signed)
Procedures

## 2017-04-22 NOTE — Progress Notes (Signed)
Patient D/C from PACU with IV's, ART line, NG, and Foley in place, patient D/C'd to [facility].

## 2017-04-22 NOTE — Anesthesia Preprocedure Evaluation (Signed)
Anesthesia Evaluation  Patient identified by MRN, date of birth, ID band Patient awake    Reviewed: Allergy & Precautions, NPO status , Patient's Chart, lab work & pertinent test results  Airway Mallampati: II  TM Distance: >3 FB Neck ROM: Full    Dental  (+) Teeth Intact, Dental Advisory Given   Pulmonary asthma , sleep apnea ,    Pulmonary exam normal breath sounds clear to auscultation       Cardiovascular hypertension, Normal cardiovascular exam Rhythm:Regular Rate:Normal     Neuro/Psych  Headaches, PSYCHIATRIC DISORDERS Anxiety Depression Bipolar Disorder  Neuromuscular disease ([place])    GI/Hepatic Neg liver ROS, GERD  ,  Endo/Other  diabetesHypothyroidism Hyperthyroidism Graves disease  Renal/GU negative Renal ROS     Musculoskeletal  (+) Fibromyalgia -  Abdominal   Peds  Hematology  (+) Blood dyscrasia, anemia ,   Anesthesia Other Findings Day of surgery medications reviewed with the patient.  Reproductive/Obstetrics                             Anesthesia Physical  Anesthesia Plan  ASA: III and emergent  Anesthesia Plan: General   Post-op Pain Management:    Induction: Intravenous  PONV Risk Score and Plan: 4 or greater and Midazolam, Ondansetron and Dexamethasone  Airway Management Planned: Oral ETT  Additional Equipment: Arterial line  Intra-op Plan:   Post-operative Plan: Extubation in OR  Informed Consent: I have reviewed the patients History and Physical, chart, labs and discussed the procedure including the risks, benefits and alternatives for the proposed anesthesia with the patient or authorized representative who has indicated his/her understanding and acceptance.   Dental advisory given  Plan Discussed with: CRNA  Anesthesia Plan Comments:         Anesthesia Quick Evaluation

## 2017-04-22 NOTE — ED Triage Notes (Signed)
Per EMS: Pt was at church and the family notices she was acting weird. Pt reports dizziness/weakness. Pt reports abdominal pain and had gastric bypass the 21st of January.  Pt has had 450 of Normal saline.  Last BP 94/74 Pt's CBG 146 HR 90 Pt is 98% on RA

## 2017-04-22 NOTE — ED Notes (Signed)
Pt's family reports that she did not hit her head. Pt's family reports she just put her head down, chin to chest and seemed to not be very alert.

## 2017-04-22 NOTE — Anesthesia Procedure Notes (Addendum)
Procedure Name: Intubation Date/Time: 04/22/2017 5:50 PM Performed by: Otisha Spickler D, CRNA Pre-anesthesia Checklist: Patient identified, Emergency Drugs available, Suction available and Patient being monitored Patient Re-evaluated:Patient Re-evaluated prior to induction Oxygen Delivery Method: Circle system utilized Preoxygenation: Pre-oxygenation with 100% oxygen Induction Type: IV induction, Rapid sequence and Cricoid Pressure applied Grade View: Grade II Tube type: Oral Tube size: 7.5 mm Number of attempts: 1 Airway Equipment and Method: Stylet Placement Confirmation: ETT inserted through vocal cords under direct vision,  positive ETCO2 and breath sounds checked- equal and bilateral Secured at: 21 cm Tube secured with: Tape Dental Injury: Teeth and Oropharynx as per pre-operative assessment

## 2017-04-22 NOTE — Anesthesia Procedure Notes (Signed)
Arterial Line Insertion Performed by: Nolon Nations, MD  Patient location: Pre-op. Preanesthetic checklist: patient identified, IV checked, site marked, risks and benefits discussed, surgical consent, monitors and equipment checked, pre-op evaluation, timeout performed and anesthesia consent Lidocaine 1% used for infiltration Left, radial was placed Catheter size: 20 Fr Hand hygiene performed  and maximum sterile barriers used   Attempts: 2 Procedure performed using ultrasound guided technique. Ultrasound Notes:anatomy identified, needle tip was noted to be adjacent to the nerve/plexus identified, no ultrasound evidence of intravascular and/or intraneural injection and image(s) printed for medical record Following insertion, dressing applied. Post procedure assessment: normal and unchanged  Patient tolerated the procedure well with no immediate complications.

## 2017-04-22 NOTE — ED Notes (Signed)
Patient transported to CT 

## 2017-04-22 NOTE — ED Notes (Signed)
Pt assisted to the bathroom utilizing the Clarise Cruz steady. Pt verbalized that she felt exposed and wanted another gown while RN was pushing her to the bathroom, no skin was exposed during transport. RN got pt to the bathroom and then got her another gown to put on the back.  Pt provided a urine sample and was assisted back to the bathroom utilizing the steady.

## 2017-04-22 NOTE — ED Notes (Signed)
Bed: GF84 Expected date:  Expected time:  Means of arrival:  Comments: Syncope

## 2017-04-22 NOTE — Progress Notes (Signed)
ABG not done due to pt went to OR.

## 2017-04-22 NOTE — Discharge Summary (Signed)
Physician Discharge Summary  Patient ID: Jade Mathis MRN: [id number] DOB/AGE: Mar 14, 1976 42 y.o.  Admit date: 04/22/2017 Discharge date: 04/22/2017  Admission Diagnoses: Hemorrhagic shock S/p bariatric surgery (gastric sleeve) DM MG Asthma GERD OSA HTN Hypothyroidism MO   Discharge Diagnoses:  S/p evacuation of hemoperitoneum and oversewn of bleeding staple line And same as above Resolution of hemorrhagic shock  Discharged Condition: stable  Hospital Course:  Pt was seen in the ED following a syncopal episode at church.  She was brought to our ED by EMS.  She was had hypotension which was alleviated with IV fluids, but once fluid bolus stopped, she dropped into the 26Z systolic.  Another fluid bolus was started.  She dropped again after that, so emergency release blood was obtained.  While waiting for the blood, additional crystalloid was hung.  While I was talking to her with blood being given, she dropped her pressure again into the 70s.  Decision was made to take her to surgery.  She was also experiencing significant mental status changes.  She was very anxious as well and having peritoneal signs.  She actually had a Ct scan prior to my evaluation showing hemoperitoneum.  Of note, the patient was not tachycardic while hypotensive despite taking no rate control medications.  She is on prednisone and has been for 21 years, so a stress dose of 100 mg hydrocortisone was given.    I took her to the OR emergently for ex lap and found around 1 liter of blood and clot.  The omentum had a small non-expanding clot that was a similar size to what it had been on CT 4 hours before.  The majority of blood was in the LUQ.  The staple line was evaluated and there was a bit of oozing and a raw portion of staple line that was identified.  This was oversewn.  See OR note for details.  She was extubated and taken to the pacu once it had been determined that the patient was hemodynamically stable.   Our facility has no ICU beds available, so the patient would need transfer to another hospital for care.  Since her operating surgeon is at nearby [place], it was felt it would be safest to send there for continuity of care.    I discussed the case with him.  She will go to [facility] once bed available.    Consults: None  Significant Diagnostic Studies: labs: prior to d/c HCT 11 on istat intraop.  Treatments: surgery: see above  Discharge Exam: Blood pressure (!) 123/99, pulse 86, temperature 98 F (36.7 C), resp. rate 17, height 5' 6.5" (1.689 m), weight 94.8 kg (209 lb), SpO2 96 %, not currently breastfeeding. General appearance: alert, cooperative, no distress and sleepy post op Resp: breathing comfortably Cardio: regular rate and rhythm GI: soft, approp tender, distended, dressing c/d/i.  Extremities: extremities normal, atraumatic, no cyanosis or edema  Disposition: [facility])  Allergies as of 04/22/2017      Reactions   Depo-provera [medroxyprogesterone] Other (See Comments)   Reaction:  Headaches    Vicodin [hydrocodone-acetaminophen] Nausea Only      Medication List    STOP taking these medications   Exenatide ER 2 MG/0.85ML Auij Commonly known as:  BYDUREON BCISE   HUMALOG KWIKPEN 100 UNIT/ML KiwkPen Generic drug:  insulin lispro   Insulin Degludec 200 UNIT/ML Sopn Commonly known as:  TRESIBA FLEXTOUCH   Insulin Detemir 100 UNIT/ML Pen Commonly known as:  LEVEMIR  Insulin NPH (Human) (Isophane) 100 UNIT/ML Kiwkpen Commonly known as:  HUMULIN N KWIKPEN   Insulin Syringes (Disposable) G-315 1 ML Misc   TRULICITY 1.76 HY/0.7PX Sopn Generic drug:  Dulaglutide     TAKE these medications   ACCU-CHEK SOFTCLIX LANCETS lancets 1 each by Other route 4 (four) times daily. Use as instructed   albuterol 108 (90 Base) MCG/ACT inhaler Commonly known as:  PROVENTIL HFA;VENTOLIN HFA Inhale 2 puffs into the lungs every 6 (six) hours as needed for  wheezing or shortness of breath.   albuterol (2.5 MG/3ML) 0.083% nebulizer solution Commonly known as:  PROVENTIL Take 3 mLs (2.5 mg total) by nebulization every 4 (four) hours as needed for wheezing or shortness of breath.   azaTHIOprine 50 MG tablet Commonly known as:  IMURAN Take 3 tablets (150 mg total) by mouth daily.   b complex vitamins tablet Take 1 tablet by mouth daily.   buPROPion 75 MG tablet Commonly known as:  WELLBUTRIN TAKE 1 TABLET BY MOUTH TWICE A DAY   cetirizine 10 MG tablet Commonly known as:  ZYRTEC Take 1 tablet (10 mg total) by mouth daily.   docusate sodium 100 MG capsule Commonly known as:  COLACE Take 1 capsule (100 mg total) by mouth 2 (two) times daily.   feeding supplement (GLUCERNA SHAKE) Liqd Take 237 mLs by mouth 3 (three) times daily between meals.   ferrous gluconate 324 MG tablet Commonly known as:  FERGON Take 1 tablet (324 mg total) by mouth daily with breakfast.   fluconazole 150 MG tablet Commonly known as:  DIFLUCAN Take 1 tablet (150 mg total) by mouth every 3 (three) days.   fluticasone 110 MCG/ACT inhaler Commonly known as:  FLOVENT HFA Inhale 1 puff into the lungs 2 (two) times daily.   glucose blood test strip Commonly known as:  ACCU-CHEK GUIDE Use to check blood sugar 3 times per day. Dx code E11.9   ibuprofen 800 MG tablet Commonly known as:  IBU Take 1 tablet (800 mg total) by mouth every 8 (eight) hours as needed.   levothyroxine 150 MCG tablet Commonly known as:  SYNTHROID, LEVOTHROID TAKE 1 TABLET BY MOUTH EVERY DAY   metFORMIN 500 MG 24 hr tablet Commonly known as:  GLUCOPHAGE-XR TAKE 4 TABLETS BY MOUTH EVERY DAY WITH SUPPER   oxyCODONE 15 MG immediate release tablet Commonly known as:  ROXICODONE Take 15 mg by mouth 4 (four) times daily.   pantoprazole 40 MG tablet Commonly known as:  PROTONIX TAKE 1 TABLET EVERY DAY   predniSONE 10 MG tablet Commonly known as:  DELTASONE Take 1 tablet (10 mg  total) by mouth daily.   PRENATAL MULTI +DHA 27-0.8-228 MG Caps Take 1 capsule by mouth daily.   pyridostigmine 60 MG tablet Commonly known as:  MESTINON TAKE 1 TABLET (60 MG TOTAL) BY MOUTH THREE TIMES DAILY.   QUEtiapine 100 MG tablet Commonly known as:  SEROQUEL Take 1 tablet (100 mg total) by mouth at bedtime.   SYMBICORT 160-4.5 MCG/ACT inhaler Generic drug:  budesonide-formoterol Inhale 2 puffs into the lungs 2 (two) times daily.   traZODone 100 MG tablet Commonly known as:  DESYREL Take 1 tablet (100 mg total) by mouth at bedtime.   valACYclovir 500 MG tablet Commonly known as:  VALTREX Take 1 tablet (500 mg total) by mouth 2 (two) times daily.   Vitamin D3 2000 units capsule Take 1 capsule (2,000 Units total) by mouth 2 (two) times daily.  Signed: Stark Klein 04/22/2017, 7:58 PM

## 2017-04-22 NOTE — Op Note (Signed)
PRE-OPERATIVE DIAGNOSIS: hemoperitoneum and hemorrhagic shock  POST-OPERATIVE DIAGNOSIS:  Same  PROCEDURE:  Procedure(s): Exploratory laparotomy, evacuation of hemoperitoneum, and oversew of bleeding staple line of gastric sleeve  SURGEON:  Surgeon(s): Stark Klein, MD  ASSISTANT: Coralie Keens, MD  ANESTHESIA:   general  DRAINS: none   LOCAL MEDICATIONS USED:  NONE  SPECIMEN:  No Specimen  DISPOSITION OF SPECIMEN:  N/A  COUNTS:  YES  DICTATION: .Dragon Dictation  PLAN OF CARE: transfer to [facility] ([place])  PATIENT DISPOSITION:  PACU - hemodynamically stable.  FINDINGS:  700 mL blood in abdomen and 300 mL clots.  Small omental hematoma (around 3x4 cm that was unchanging and with dark clotted blood).  EBL: 1L blood  PROCEDURE:  Patient was identified in the holding area and taken to the operating room where she was placed supine on the operating table.  General anesthesia was induced.  Foley catheter was placed.  The abdomen was prepped and draped in sterile fashion.  A timeout was performed according to the surgical safety checklist.  When all was correct, we continued.  A upper midline incision was made vertically.  The fascia was entered sharply.  Clotted blood was encountered.  This was evacuated.  There was also liquid blood that was evacuated.  The abdomen was packed.  The blood appeared to be coming from the left upper quadrant.  The left lobe of the liver was retracted and the staple line of the gastric sleeve was examined.  There was a portion of around one third from the most cranial aspect of the staple line that appeared to be raw and oozing bright red blood.  This was oversewn with a 2-0 silk for approximately a 2 inch section.  Snow hemostatic agent was placed over this.  A clean lap was placed in the abdomen was irrigated.  The staple line was reexamined and no additional bleeding was seen.  At this point the patient had gotten a third unit  of blood and was hemodynamically stable without pressors or fluid boluses.  We left the patient open for around 10 minutes to assess hemodynamic stability without continued infusions, as the patient had previously dropped her BP precipitously as soon as fluid boluses stopped.    She remained stable.  The abdomen was reexamined in all four quadrants.  The staple line was reexamined.  A small 5 mm rent was seen in the liver capsule at that point and this was coagulated.  The abdomen was reirrigated and reexamined for hemostasis and no bleeding was seen.  The fascia was closed with running #1 looped PDS suture.  The skin was irrigated and closed with skin staples.  The patient was allowed to emerge from anesthesia and was taken to the OR in stable condition.  Needle, sponge, and instrument counts were correct x 2.
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
## 2017-04-23 ENCOUNTER — Encounter (HOSPITAL_COMMUNITY): Payer: Self-pay | Admitting: General Surgery

## 2017-04-23 LAB — PREPARE FRESH FROZEN PLASMA
Unit division: 0
Unit division: 0

## 2017-04-23 LAB — POCT I-STAT 7, (LYTES, BLD GAS, ICA,H+H)
Acid-base deficit: 8 mmol/L — ABNORMAL HIGH (ref 0.0–2.0)
Bicarbonate: 15.6 mmol/L — ABNORMAL LOW (ref 20.0–28.0)
Calcium, Ion: 0.94 mmol/L — ABNORMAL LOW (ref 1.15–1.40)
HCT: 32 % — ABNORMAL LOW (ref 36.0–46.0)
Hemoglobin: 10.9 g/dL — ABNORMAL LOW (ref 12.0–15.0)
O2 Saturation: 100 %
Potassium: 3.4 mmol/L — ABNORMAL LOW (ref 3.5–5.1)
Sodium: 142 mmol/L (ref 135–145)
TCO2: 16 mmol/L — ABNORMAL LOW (ref 22–32)
pCO2 arterial: 25.1 mmHg — ABNORMAL LOW (ref 32.0–48.0)
pH, Arterial: 7.403 (ref 7.350–7.450)
pO2, Arterial: 176 mmHg — ABNORMAL HIGH (ref 83.0–108.0)

## 2017-04-23 LAB — BPAM FFP
Blood Product Expiration Date: 201902082359
Blood Product Expiration Date: 201902082359
Unit Type and Rh: 5100
Unit Type and Rh: 5100

## 2017-04-23 LAB — CBG MONITORING, ED: Glucose-Capillary: 139 mg/dL — ABNORMAL HIGH (ref 65–99)

## 2017-04-23 LAB — PREPARE RBC (CROSSMATCH)

## 2017-04-24 LAB — TYPE AND SCREEN
ABO/RH(D): O POS
Antibody Screen: POSITIVE
Donor AG Type: NEGATIVE
Donor AG Type: NEGATIVE
Donor AG Type: NEGATIVE
PT AG Type: NEGATIVE
Unit division: 0
Unit division: 0
Unit division: 0
Unit division: 0
Unit division: 0
Unit division: 0

## 2017-04-24 LAB — BPAM RBC
Blood Product Expiration Date: 201902152359
Blood Product Expiration Date: 201902172359
Blood Product Expiration Date: 201902202359
Blood Product Expiration Date: 201902202359
Blood Product Expiration Date: 201903112359
Blood Product Expiration Date: 201903122359
ISSUE DATE / TIME: 201902031637
ISSUE DATE / TIME: 201902031637
ISSUE DATE / TIME: 201902031727
ISSUE DATE / TIME: 201902031727
ISSUE DATE / TIME: 201902031751
ISSUE DATE / TIME: 201902031751
Unit Type and Rh: 5100
Unit Type and Rh: 5100
Unit Type and Rh: 9500
Unit Type and Rh: 9500
Unit Type and Rh: 9500
Unit Type and Rh: 9500

## 2017-04-24 NOTE — Anesthesia Postprocedure Evaluation (Signed)
Anesthesia Post Note  Patient: Jade Mathis  Procedure(s) Performed: EXPLORATORY LAPAROTOMY, OVERSEWING OF STAPLE LINE, EVACUATION OF HEMAPERITONEUM (N/A Abdomen)     Patient location during evaluation: PACU Anesthesia Type: General Level of consciousness: sedated and patient cooperative Pain management: pain level controlled Vital Signs Assessment: post-procedure vital signs reviewed and stable Respiratory status: spontaneous breathing Cardiovascular status: stable Anesthetic complications: no    Last Vitals:  Vitals:   04/22/17 2100 04/22/17 2115  BP: 135/82 129/78  Pulse: 77 75  Resp: 16 17  Temp:  36.9 C  SpO2: 100% 100%    Last Pain:  Vitals:   04/23/17 1557  TempSrc:   PainSc: 10-Worst pain ever                 Nolon Nations

## 2017-04-25 ENCOUNTER — Other Ambulatory Visit: Payer: Self-pay | Admitting: Endocrinology

## 2017-04-27 ENCOUNTER — Other Ambulatory Visit (INDEPENDENT_AMBULATORY_CARE_PROVIDER_SITE_OTHER): Payer: Medicare Other

## 2017-04-27 DIAGNOSIS — E1165 Type 2 diabetes mellitus with hyperglycemia: Secondary | ICD-10-CM

## 2017-04-27 DIAGNOSIS — R51 Headache: Secondary | ICD-10-CM | POA: Diagnosis not present

## 2017-04-27 DIAGNOSIS — Z794 Long term (current) use of insulin: Secondary | ICD-10-CM | POA: Diagnosis not present

## 2017-04-27 DIAGNOSIS — M5412 Radiculopathy, cervical region: Secondary | ICD-10-CM | POA: Diagnosis not present

## 2017-04-27 DIAGNOSIS — Z79899 Other long term (current) drug therapy: Secondary | ICD-10-CM | POA: Diagnosis not present

## 2017-04-27 DIAGNOSIS — E119 Type 2 diabetes mellitus without complications: Secondary | ICD-10-CM | POA: Diagnosis not present

## 2017-04-27 LAB — BASIC METABOLIC PANEL
BUN: 6 mg/dL (ref 6–23)
CO2: 31 mEq/L (ref 19–32)
Calcium: 8.4 mg/dL (ref 8.4–10.5)
Chloride: 101 mEq/L (ref 96–112)
Creatinine, Ser: 0.54 mg/dL (ref 0.40–1.20)
GFR: 159.09 mL/min (ref >60.00)
Glucose, Bld: 102 mg/dL — ABNORMAL HIGH (ref 70–99)
Potassium: 3.3 mEq/L — ABNORMAL LOW (ref 3.5–5.1)
Sodium: 137 mEq/L (ref 135–145)

## 2017-04-27 LAB — TSH: TSH: 2.77 u[IU]/mL (ref 0.35–4.50)

## 2017-04-27 LAB — T4, FREE: Free T4: 0.99 ng/dL (ref 0.60–1.60)

## 2017-04-27 LAB — HEMOGLOBIN A1C: Hgb A1c MFr Bld: 6.8 % — ABNORMAL HIGH (ref 4.6–6.5)

## 2017-04-27 MED ORDER — BENZOCAINE-MENTHOL 15-3.6 MG MT LOZG
LOZENGE | OROMUCOSAL | Status: DC
Start: ? — End: 2017-04-27

## 2017-04-27 MED ORDER — HYDROMORPHONE HCL 1 MG/ML IJ SOLN
.20 | INTRAMUSCULAR | Status: DC
Start: ? — End: 2017-04-27

## 2017-04-27 MED ORDER — CALCIUM CARBONATE ANTACID 500 MG PO CHEW
CHEWABLE_TABLET | ORAL | Status: DC
Start: 2017-04-26 — End: 2017-04-27

## 2017-04-27 MED ORDER — PYRIDOSTIGMINE BROMIDE 60 MG PO TABS
60.00 | ORAL_TABLET | ORAL | Status: DC
Start: 2017-04-26 — End: 2017-04-27

## 2017-04-27 MED ORDER — TRAMADOL HCL 50 MG PO TABS
50.00 | ORAL_TABLET | ORAL | Status: DC
Start: ? — End: 2017-04-27

## 2017-04-27 MED ORDER — SIMETHICONE 40 MG/0.6ML PO SUSP
80.00 | ORAL | Status: DC
Start: ? — End: 2017-04-27

## 2017-04-27 MED ORDER — ALBUTEROL SULFATE (2.5 MG/3ML) 0.083% IN NEBU
2.50 | INHALATION_SOLUTION | RESPIRATORY_TRACT | Status: DC
Start: ? — End: 2017-04-27

## 2017-04-27 MED ORDER — NALOXONE HCL 0.4 MG/ML IJ SOLN
.40 | INTRAMUSCULAR | Status: DC
Start: ? — End: 2017-04-27

## 2017-04-27 MED ORDER — POLYSACCHARIDE IRON COMPLEX 150 MG PO CAPS
150.00 | ORAL_CAPSULE | ORAL | Status: DC
Start: 2017-04-27 — End: 2017-04-27

## 2017-04-27 MED ORDER — HYDRALAZINE HCL 20 MG/ML IJ SOLN
10.00 | INTRAMUSCULAR | Status: DC
Start: ? — End: 2017-04-27

## 2017-04-27 MED ORDER — PHENOL 1.4 % MT LIQD
OROMUCOSAL | Status: DC
Start: ? — End: 2017-04-27

## 2017-04-27 MED ORDER — PANTOPRAZOLE SODIUM 40 MG IV SOLR
40.00 | INTRAVENOUS | Status: DC
Start: 2017-04-27 — End: 2017-04-27

## 2017-04-27 MED ORDER — QUETIAPINE FUMARATE 200 MG PO TABS
200.00 | ORAL_TABLET | ORAL | Status: DC
Start: 2017-04-26 — End: 2017-04-27

## 2017-04-27 MED ORDER — CLOTRIMAZOLE 10 MG MT LOZG
LOZENGE | OROMUCOSAL | Status: DC
Start: 2017-04-26 — End: 2017-04-27

## 2017-04-27 MED ORDER — GENERIC EXTERNAL MEDICATION
Status: DC
Start: ? — End: 2017-04-27

## 2017-04-27 MED ORDER — ACYCLOVIR 200 MG PO CAPS
400.00 | ORAL_CAPSULE | ORAL | Status: DC
Start: 2017-04-26 — End: 2017-04-27

## 2017-04-27 MED ORDER — BUDESONIDE-FORMOTEROL FUMARATE 160-4.5 MCG/ACT IN AERO
INHALATION_SPRAY | RESPIRATORY_TRACT | Status: DC
Start: 2017-04-26 — End: 2017-04-27

## 2017-04-27 MED ORDER — SODIUM CHLORIDE 0.9 % IV SOLN
INTRAVENOUS | Status: DC
Start: ? — End: 2017-04-27

## 2017-04-27 MED ORDER — GENERIC EXTERNAL MEDICATION
Status: DC
Start: 2017-04-27 — End: 2017-04-27

## 2017-04-27 MED ORDER — TRAZODONE HCL 100 MG PO TABS
100.00 | ORAL_TABLET | ORAL | Status: DC
Start: 2017-04-26 — End: 2017-04-27

## 2017-04-28 DIAGNOSIS — R58 Hemorrhage, not elsewhere classified: Secondary | ICD-10-CM | POA: Diagnosis not present

## 2017-04-28 DIAGNOSIS — R269 Unspecified abnormalities of gait and mobility: Secondary | ICD-10-CM | POA: Diagnosis not present

## 2017-04-28 DIAGNOSIS — G4733 Obstructive sleep apnea (adult) (pediatric): Secondary | ICD-10-CM | POA: Diagnosis not present

## 2017-04-28 DIAGNOSIS — M79609 Pain in unspecified limb: Secondary | ICD-10-CM | POA: Diagnosis not present

## 2017-04-28 DIAGNOSIS — D1803 Hemangioma of intra-abdominal structures: Secondary | ICD-10-CM | POA: Diagnosis not present

## 2017-04-28 MED ORDER — GENERIC EXTERNAL MEDICATION
Status: DC
Start: ? — End: 2017-04-28

## 2017-04-29 ENCOUNTER — Encounter: Payer: Self-pay | Admitting: Endocrinology
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
## 2017-04-30 ENCOUNTER — Encounter: Payer: Self-pay | Admitting: Endocrinology

## 2017-04-30 ENCOUNTER — Ambulatory Visit (INDEPENDENT_AMBULATORY_CARE_PROVIDER_SITE_OTHER): Payer: Medicare Other | Admitting: Endocrinology

## 2017-04-30 VITALS — BP 128/88 | HR 81 | Ht 66.5 in | Wt 209.4 lb

## 2017-04-30 DIAGNOSIS — Z794 Long term (current) use of insulin: Secondary | ICD-10-CM | POA: Diagnosis not present

## 2017-04-30 DIAGNOSIS — E1165 Type 2 diabetes mellitus with hyperglycemia: Secondary | ICD-10-CM | POA: Diagnosis not present

## 2017-04-30 DIAGNOSIS — E876 Hypokalemia: Secondary | ICD-10-CM

## 2017-04-30 NOTE — Patient Instructions (Signed)
Check blood sugars on waking up 3/7   Also check blood sugars about 2 hours after a meal and do this after different meals by rotation  Recommended blood sugar levels on waking up is 90-130 and about 2 hours after meal is 130-160  Please bring your blood sugar monitor to each visit, thank you  

## 2017-05-01 DIAGNOSIS — R58 Hemorrhage, not elsewhere classified: Secondary | ICD-10-CM | POA: Diagnosis not present

## 2017-05-08 ENCOUNTER — Other Ambulatory Visit: Payer: Self-pay | Admitting: Family Medicine

## 2017-05-09 DIAGNOSIS — Z9884 Bariatric surgery status: Secondary | ICD-10-CM | POA: Diagnosis not present

## 2017-05-09 DIAGNOSIS — Z713 Dietary counseling and surveillance: Secondary | ICD-10-CM | POA: Diagnosis not present

## 2017-05-16 DIAGNOSIS — E876 Hypokalemia: Secondary | ICD-10-CM | POA: Diagnosis not present

## 2017-05-19 ENCOUNTER — Other Ambulatory Visit: Payer: Self-pay | Admitting: Neurology

## 2017-05-20 DIAGNOSIS — G4733 Obstructive sleep apnea (adult) (pediatric): Secondary | ICD-10-CM | POA: Diagnosis not present

## 2017-05-22 ENCOUNTER — Ambulatory Visit (INDEPENDENT_AMBULATORY_CARE_PROVIDER_SITE_OTHER): Payer: Medicare Other | Admitting: Podiatry

## 2017-05-22 ENCOUNTER — Encounter: Payer: Self-pay | Admitting: Podiatry

## 2017-05-22 ENCOUNTER — Ambulatory Visit: Payer: Medicare Other

## 2017-05-22 DIAGNOSIS — E0842 Diabetes mellitus due to underlying condition with diabetic polyneuropathy: Secondary | ICD-10-CM

## 2017-05-22 DIAGNOSIS — M79674 Pain in right toe(s): Secondary | ICD-10-CM | POA: Diagnosis not present

## 2017-05-22 DIAGNOSIS — G4733 Obstructive sleep apnea (adult) (pediatric): Secondary | ICD-10-CM | POA: Diagnosis not present

## 2017-05-22 DIAGNOSIS — B351 Tinea unguium: Secondary | ICD-10-CM | POA: Diagnosis not present

## 2017-05-22 DIAGNOSIS — M79675 Pain in left toe(s): Secondary | ICD-10-CM | POA: Diagnosis not present

## 2017-05-22 DIAGNOSIS — Q6650 Congenital pes planus, unspecified foot: Secondary | ICD-10-CM

## 2017-05-23 ENCOUNTER — Other Ambulatory Visit: Payer: Self-pay | Admitting: Endocrinology

## 2017-05-23 NOTE — Progress Notes (Addendum)
Subjective: 42 y.o. returns the office today for painful, elongated, thickened toenails which she cannot trim herself. Denies any redness or drainage around the nails. Denies any acute changes since last appointment and no new complaints today. Denies any systemic complaints such as fevers, chills, nausea, vomiting.   PCP: Hoyt Koch, MD A1c: 6.8  Objective: AAO 3, NAD DP/PT pulses palpable, CRT less than 3 seconds Sensation decreased with SWMF.  Nails hypertrophic, dystrophic, elongated, brittle, discolored 10. There is tenderness overlying the nails 1-5 bilaterally. There is no surrounding erythema or drainage along the nail sites. No open lesions or pre-ulcerative lesions are identified. Flatfoot present  No other areas of tenderness bilateral lower extremities. No overlying edema, erythema, increased warmth. No pain with calf compression, swelling, warmth, erythema.  Assessment: Patient presents with symptomatic onychomycosis  Plan: -Treatment options including alternatives, risks, complications were discussed -Nails sharply debrided 10 sensation decreased with Simms Weinstein monofilament without complication/bleeding. -Discussed daily foot inspection. If there are any changes, to call the office immediately.  -Given neuropathy and flatfoot I have recommended diabetic shoes. Paperwork was completed today.  -Follow-up in 3 months or sooner if any problems are to arise. In the meantime, encouraged to call the office with any questions, concerns, changes symptoms.  Celesta Gentile, DPM

## 2017-05-25 DIAGNOSIS — E119 Type 2 diabetes mellitus without complications: Secondary | ICD-10-CM | POA: Diagnosis not present

## 2017-05-25 DIAGNOSIS — M5412 Radiculopathy, cervical region: Secondary | ICD-10-CM | POA: Diagnosis not present

## 2017-05-25 DIAGNOSIS — Z79899 Other long term (current) drug therapy: Secondary | ICD-10-CM | POA: Diagnosis not present

## 2017-05-25 DIAGNOSIS — R51 Headache: Secondary | ICD-10-CM | POA: Diagnosis not present

## 2017-06-02 ENCOUNTER — Other Ambulatory Visit: Payer: Self-pay | Admitting: Neurology

## 2017-06-09 ENCOUNTER — Other Ambulatory Visit: Payer: Self-pay | Admitting: Internal Medicine

## 2017-06-12 DIAGNOSIS — M25532 Pain in left wrist: Secondary | ICD-10-CM | POA: Diagnosis not present

## 2017-06-12 DIAGNOSIS — M654 Radial styloid tenosynovitis [de Quervain]: Secondary | ICD-10-CM | POA: Diagnosis not present

## 2017-06-12 DIAGNOSIS — M25539 Pain in unspecified wrist: Secondary | ICD-10-CM | POA: Diagnosis not present

## 2017-06-15 ENCOUNTER — Other Ambulatory Visit: Payer: Self-pay | Admitting: Neurology

## 2017-06-20 DIAGNOSIS — G4733 Obstructive sleep apnea (adult) (pediatric): Secondary | ICD-10-CM | POA: Diagnosis not present

## 2017-06-21 ENCOUNTER — Other Ambulatory Visit: Payer: Self-pay | Admitting: Endocrinology

## 2017-06-22 DIAGNOSIS — E119 Type 2 diabetes mellitus without complications: Secondary | ICD-10-CM | POA: Diagnosis not present

## 2017-06-22 DIAGNOSIS — R51 Headache: Secondary | ICD-10-CM | POA: Diagnosis not present

## 2017-06-22 DIAGNOSIS — M5412 Radiculopathy, cervical region: Secondary | ICD-10-CM | POA: Diagnosis not present

## 2017-06-22 DIAGNOSIS — Z79891 Long term (current) use of opiate analgesic: Secondary | ICD-10-CM | POA: Diagnosis not present

## 2017-06-25 ENCOUNTER — Other Ambulatory Visit: Payer: Self-pay

## 2017-06-26 ENCOUNTER — Other Ambulatory Visit (INDEPENDENT_AMBULATORY_CARE_PROVIDER_SITE_OTHER): Payer: Medicare Other

## 2017-06-26 ENCOUNTER — Ambulatory Visit: Payer: Medicare Other

## 2017-06-26 DIAGNOSIS — Q6652 Congenital pes planus, left foot: Secondary | ICD-10-CM | POA: Diagnosis not present

## 2017-06-26 DIAGNOSIS — E1165 Type 2 diabetes mellitus with hyperglycemia: Secondary | ICD-10-CM

## 2017-06-26 DIAGNOSIS — E876 Hypokalemia: Secondary | ICD-10-CM | POA: Diagnosis not present

## 2017-06-26 DIAGNOSIS — Z794 Long term (current) use of insulin: Secondary | ICD-10-CM

## 2017-06-26 DIAGNOSIS — E0842 Diabetes mellitus due to underlying condition with diabetic polyneuropathy: Secondary | ICD-10-CM | POA: Diagnosis not present

## 2017-06-26 DIAGNOSIS — Q6651 Congenital pes planus, right foot: Secondary | ICD-10-CM | POA: Diagnosis not present

## 2017-06-26 LAB — COMPREHENSIVE METABOLIC PANEL
ALT: 9 U/L (ref 0–35)
AST: 12 U/L (ref 0–37)
Albumin: 3.9 g/dL (ref 3.5–5.2)
Alkaline Phosphatase: 87 U/L (ref 39–117)
BUN: 14 mg/dL (ref 6–23)
CO2: 29 mEq/L (ref 19–32)
Calcium: 8.9 mg/dL (ref 8.4–10.5)
Chloride: 103 mEq/L (ref 96–112)
Creatinine, Ser: 0.58 mg/dL (ref 0.40–1.20)
GFR: 146.38 mL/min (ref >60.00)
Glucose, Bld: 171 mg/dL — ABNORMAL HIGH (ref 70–99)
Potassium: 3.8 mEq/L (ref 3.5–5.1)
Sodium: 138 mEq/L (ref 135–145)
Total Bilirubin: 0.4 mg/dL (ref 0.2–1.2)
Total Protein: 7.3 g/dL (ref 6.0–8.3)

## 2017-06-26 LAB — MAGNESIUM: Magnesium: 2 mg/dL (ref 1.5–2.5)

## 2017-06-27 ENCOUNTER — Telehealth: Payer: Self-pay

## 2017-06-27 ENCOUNTER — Ambulatory Visit (INDEPENDENT_AMBULATORY_CARE_PROVIDER_SITE_OTHER): Payer: Medicare Other | Admitting: Endocrinology

## 2017-06-27 ENCOUNTER — Encounter: Payer: Self-pay | Admitting: Endocrinology

## 2017-06-27 VITALS — BP 112/84 | HR 86 | Ht 66.5 in | Wt 207.4 lb

## 2017-06-27 DIAGNOSIS — E1165 Type 2 diabetes mellitus with hyperglycemia: Secondary | ICD-10-CM | POA: Diagnosis not present

## 2017-06-27 LAB — FRUCTOSAMINE: Fructosamine: 250 umol/L (ref 0–285)

## 2017-06-27 MED ORDER — CANAGLIFLOZIN 100 MG PO TABS: ORAL_TABLET | ORAL | 3 refills | Status: DC

## 2017-06-27 MED ORDER — FLUCONAZOLE 150 MG PO TABS: 150.0000 mg | ORAL_TABLET | Freq: Once | ORAL | 1 refills | Status: AC

## 2017-06-27 NOTE — Telephone Encounter (Signed)
Called number back and LVM informing that I had not received any orders for the glucerna or the pull ups. Gave our fax numbers to make sure they had correct fax
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
## 2017-06-27 NOTE — Telephone Encounter (Signed)
Copied from [place] [id number]. Topic: General - Other >> Jun 27, 2017  8:18 AM Carolyn Stare wrote:  Cap nurse has sent an order over for glucerna and pull ups and they are asking when the order is going to be sent back   [phone]

## 2017-06-28 DIAGNOSIS — Z713 Dietary counseling and surveillance: Secondary | ICD-10-CM | POA: Diagnosis not present

## 2017-06-28 DIAGNOSIS — Z9884 Bariatric surgery status: Secondary | ICD-10-CM | POA: Diagnosis not present

## 2017-06-29 NOTE — Telephone Encounter (Signed)
Patient called back today and said that her cap nurse resend the order and has not yet received anything back. Her cap nurse number is [phone] that [place] can call her back, thanks.

## 2017-07-01 ENCOUNTER — Other Ambulatory Visit: Payer: Self-pay | Admitting: Internal Medicine

## 2017-07-02 NOTE — Telephone Encounter (Signed)
Patient informed that the form was faxed to [place] and placed in MD box to sign. Patients stated awareness

## 2017-07-10 ENCOUNTER — Other Ambulatory Visit: Payer: Self-pay

## 2017-07-10 MED ORDER — LEVOTHYROXINE SODIUM 150 MCG PO TABS: 150.0000 ug | ORAL_TABLET | Freq: Every day | ORAL | 3 refills | Status: DC

## 2017-07-14 ENCOUNTER — Other Ambulatory Visit: Payer: Self-pay | Admitting: Endocrinology

## 2017-07-17 ENCOUNTER — Other Ambulatory Visit: Payer: Self-pay

## 2017-07-17 MED ORDER — LEVOTHYROXINE SODIUM 150 MCG PO TABS: 150.0000 ug | ORAL_TABLET | Freq: Every day | ORAL | 0 refills | Status: DC

## 2017-07-19 ENCOUNTER — Other Ambulatory Visit: Payer: Self-pay | Admitting: Neurology

## 2017-07-20 ENCOUNTER — Ambulatory Visit (INDEPENDENT_AMBULATORY_CARE_PROVIDER_SITE_OTHER): Payer: Medicare Other | Admitting: Neurology

## 2017-07-20 ENCOUNTER — Encounter

## 2017-07-20 ENCOUNTER — Encounter: Payer: Self-pay | Admitting: Neurology

## 2017-07-20 VITALS — BP 104/76 | HR 78 | Ht 66.0 in | Wt 199.0 lb

## 2017-07-20 DIAGNOSIS — G7000 Myasthenia gravis without (acute) exacerbation: Secondary | ICD-10-CM

## 2017-07-20 IMAGING — US US MFM FETAL BPP W/O NON-STRESS
1 series · 15 of 15 positions shown · non-contrast
Comparison: none

[Series 1: us mfm fetal bpp w/o non-stress · 15 acquisitions, 15 frames shown]
[im 1/15]
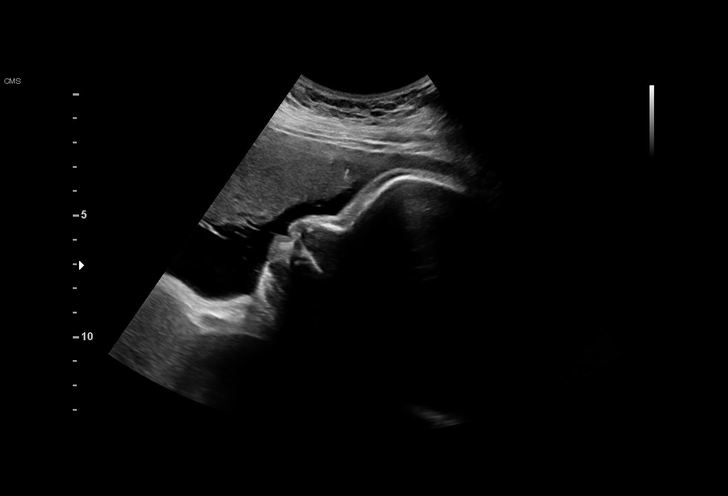
[im 2/15]
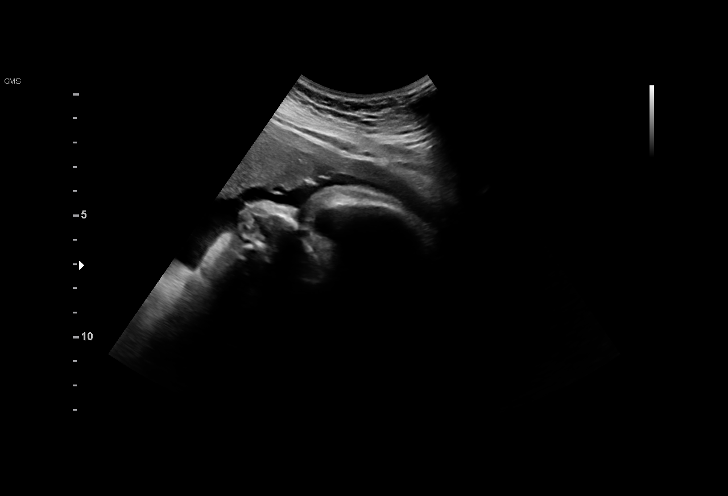
[im 3/15]
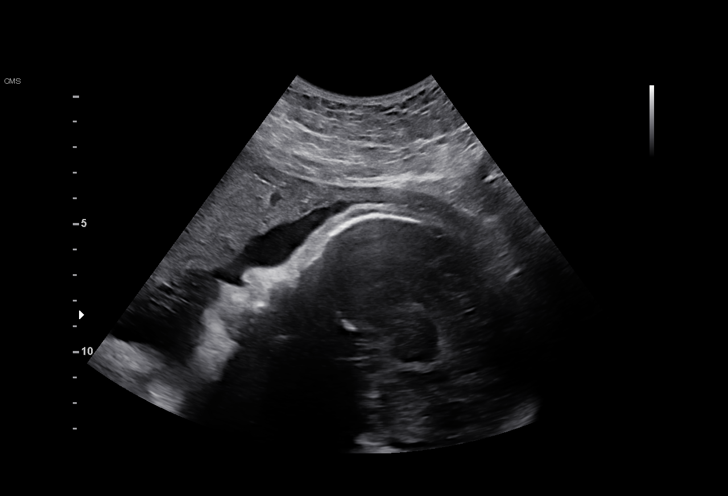
[im 4/15]
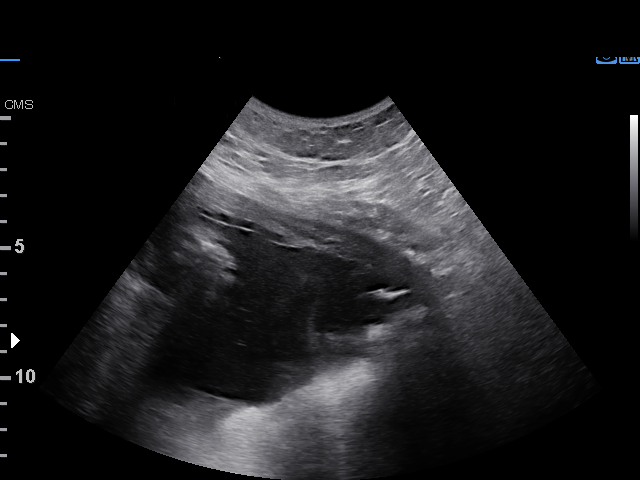
[im 5/15]
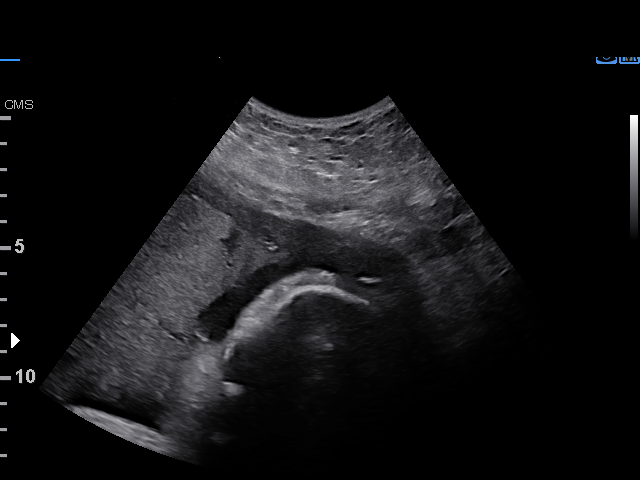
[im 6/15]
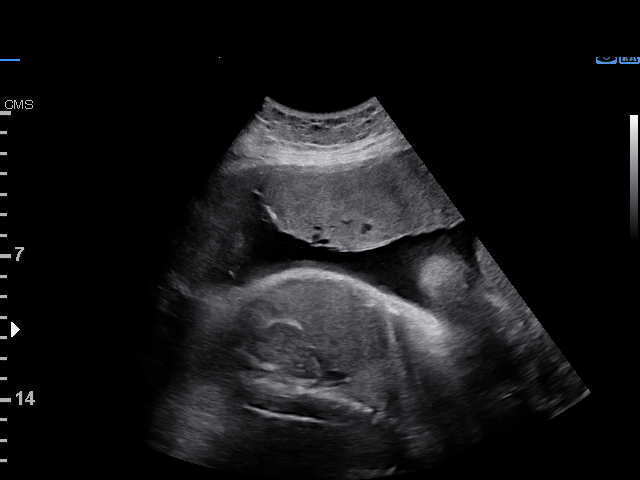
[im 7/15]
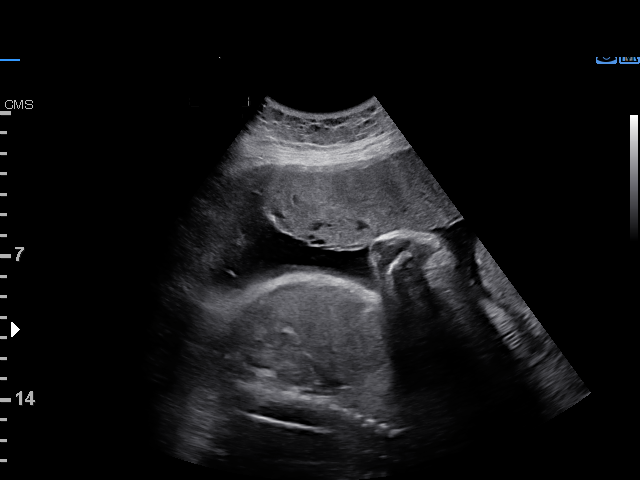
[im 8/15]
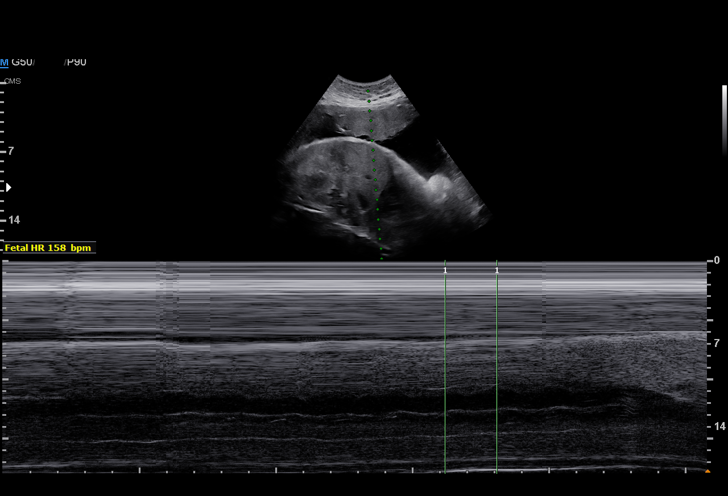
[im 9/15]
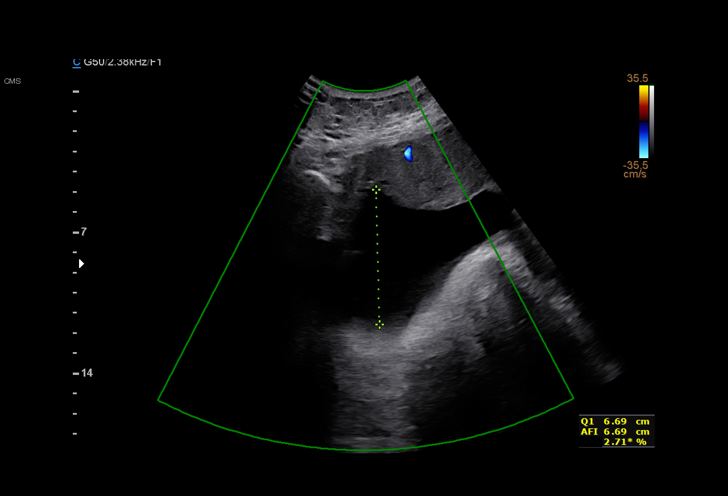
[im 10/15]
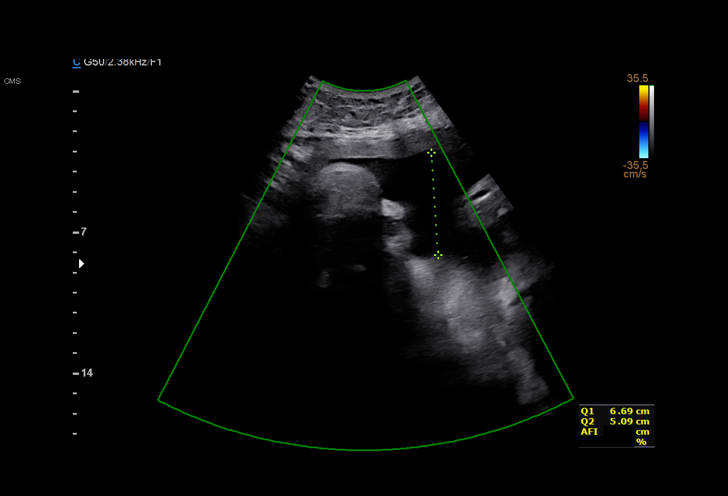
[im 11/15]
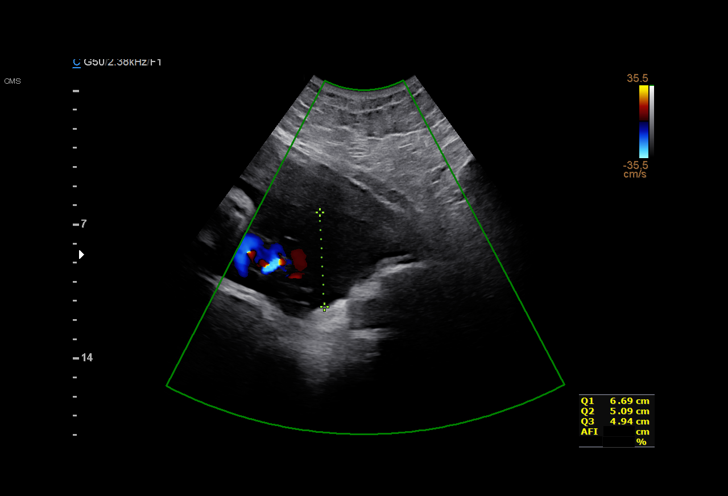
[im 12/15]
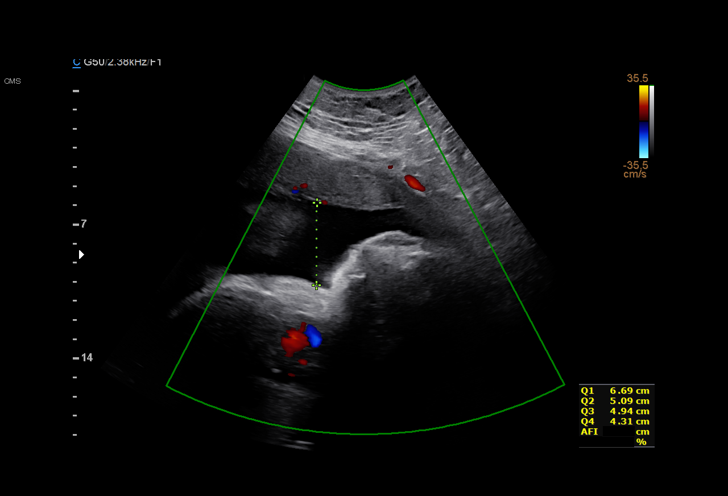
[im 13/15]
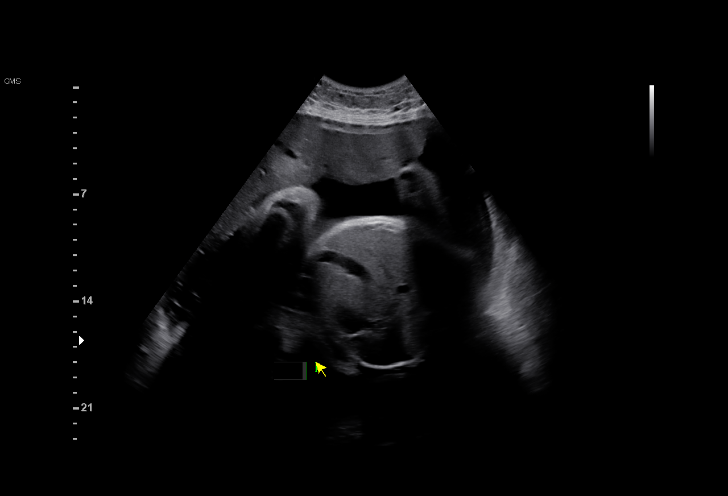
[im 14/15]
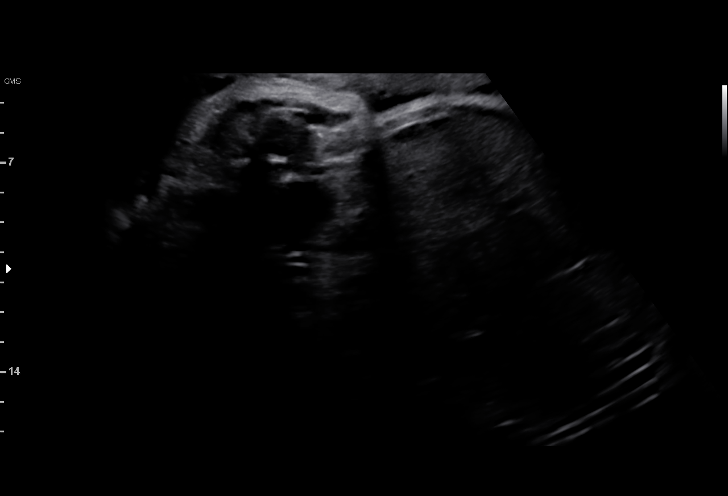
[im 15/15]
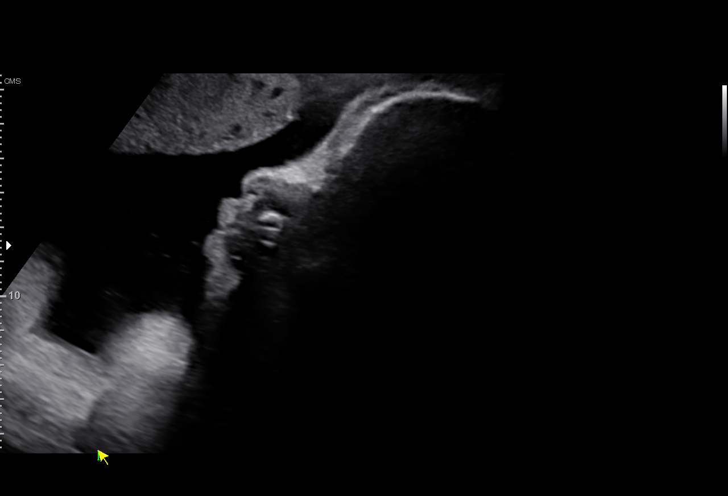

[15 of 15 positions shown; findings below may reference images not displayed]

1  BENRABAH ETOIL              [id number]      [id number]     [id number]
Indications

35 weeks gestation of pregnancy
Hypothyroid (post surgical) on replacement
Andi
Goldman diabetes, type 2, in pregnancy,
third trimester (insulin); normal fetal ECHO
Advanced maternal age multigravida 35+,
third trimester (low risk NIPS)
Obesity complicating pregnancy, third
trimester
Medical complication of pregnancy:
myasthenia gravis on Mestinon and
Prednisone
OB History

Gravidity:    6         Term:   3         SAB:   2
Fetal Evaluation

Num Of Fetuses:     1
Fetal Heart         158
Rate(bpm):
Cardiac Activity:   Observed
Presentation:       Cephalic

Amniotic Fluid
AFI FV:      Subjectively within normal limits

AFI Sum(cm)     %Tile       Largest Pocket(cm)
21.03           79

RUQ(cm)       RLQ(cm)       LUQ(cm)        LLQ(cm)
6.69
Biophysical Evaluation

Amniotic F.V:   Within normal limits       F. Tone:        Observed
F. Movement:    Observed                   Score:          [DATE]
F. Breathing:   Observed
Gestational Age

LMP:           35w 6d        Date:  12/31/15                 EDD:   10/06/16
Best:          35w 6d     Det. By:  LMP  (12/31/15)          EDD:   10/06/16
Impression

Singleton intrauterine pregnancy at 35+6 weeks with AMA,
type 2 DM and myasthenia gravis
Cephalic presentation
BPP [DATE] with an AFI of 21cm
Recommendations

Continue current antepartum testing regimen

## 2017-07-20 MED ORDER — AZATHIOPRINE 50 MG PO TABS: ORAL_TABLET | ORAL | 3 refills | Status: DC

## 2017-07-20 MED ORDER — PREDNISONE 10 MG PO TABS: 10.0000 mg | ORAL_TABLET | Freq: Every day | ORAL | 3 refills | Status: DC

## 2017-07-20 MED ORDER — PYRIDOSTIGMINE BROMIDE 60 MG PO TABS: ORAL_TABLET | ORAL | 3 refills | Status: DC

## 2017-07-20 NOTE — Patient Instructions (Signed)
Return to clinic in 6 months.

## 2017-07-20 NOTE — Progress Notes (Signed)
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   History of present illness: She was diagnosed with myasthenia gravis in 11-Jul-1995 with positive RNS and serology testing manifesting with weakness, double vision, ptosis, dysphagia and dysarthria. The same year, she underwent thymectomy with post-operative period complicated by respiratory failure requiring ICU stay and plasmapharesis. Around the same time, prednisone and mestinon was initiated. At some point, she was started on azathioprine. Azathioprine was stopped in 2002-07-11 because of an unplanned pregnancy and she remained on prednisone 20mg  until 07-10-2009 when she had an exacerbation requiring out-patient PLEX. In 2009/07/10, Imuran was restarted (dose unknown). She been on prednisone since 07-11-1995 and the lowest dose is her current dose at 10mg  daily. Cellcept was tried but she developed a rash so it was stopped.   The patient has previously been evaluated at [facility] by Dr. Truman [facility] 07/11/10), discharged from [facility], and then established care with Dr. Jacelyn [facility] at [facility] in April 2013. From 12/2011 - 07/2012, she was evaluated by Dr. Jannifer Franklin at [facility]. When she initially saw Dr. Jacelyn [facility] in April 2013, she was only on prednisone 10mg  and mestinon 60mg  QID and demonstrated clinical signs of weakness, therefore Imuran was restarted.  She is seeing Dr. Sima [facility] at the [facility] in [place].    There was also a huge overlay of depression which started after her father's death in 2011/07/11 when he died within a month of being diagnosed with  lung cancer diagnosis. She sees psychiatry and behavorial therapist.  S  In 07-11-14, she was doing well and able to reduce her prednisone to 20mg  every other day.  In June 2016, she was hospitalized for probable MG exacerbation associated with GI illness and completed 3 treatments of IVIG.  Patient continued to have persistent dysarthria so an additional 2-days of IVIG was completed at home in early July.   In 2015/07/11, she was doing great at her follow-up visit in May and down to prednisone 10mg  daily.  However, within a week she returned to the [facility] with delusional thoughts, confusion, and depression.   She presented here on 08/26/2015 without a scheduled appointment demanding to have her blood checked for "poison".  She was very delusional, claiming her husband was having an affair and the woman was doing witchcraft - see telephone note for details. She also had unwanted pregnancy and had a miscarriage on 9/13.    UPDATE 08/16/2016:  She is here for routine follow-up appointment and appears very depressed and tearful at times. She expresses being tired, fatigued, and stressed all the time.  She denies any new MG-symptoms, but feels like it is getting worse.  In the past, she has often mistaken true myasthenia exacerbation from stressed-induced fatigue and depression.  She admits to being very stressed because she wants to be induced early as the pregnancy is making her extremely tired.  She also expresses a lot of frustration and stress related to her attorney.  She has not been seeing her therapist regularly.  She denies any double vision, difficulty swallowing/talking, or limb weakness.  UPDATE 12/06/2016:  She is here for follow-up visit and to discuss tapering her prednisone in  order to have bariatric surgery.  She delivered a baby girl in August and restarted azathioprine 150mg  daily, and continues to take prednisone 10mg  and mestinon 60mg  TID.  She is not breast feeding.  She is upset today because I would  recommend medication changes to her prednisone via MyChart message and asked for her to be evaluated in the [facility], despite explaining that I had not seen her in 4 months and needed to establish whether it was safe to reduce her prednisone, given that she has never been able to taper below 10mg  since her diagnosis in 1997.  UPDATE 07/20/2017:  She had bariatric surgery in January 6010 which was complicated by intraabdominal hemorrhage.  She has recovered well and lost about 30lb.  She did not have any exacerbations of myasthenia during this time.  Overall, MG symptoms are well controlled on azathioprine 150mg , prednisone 10mg , and mestinon 60mg  TID.  She is feeling very tired and fatigued.  Mood is fair.  Her baby girl, Designer, [employer], will be turning one this summer.  Her sister and 69 year old son help take care of the baby.   Medications:  Current Outpatient Medications on File Prior to Visit  Medication Sig Dispense Refill  . ACCU-CHEK AVIVA PLUS test strip TEST BLOOD SUGAR 4 TIMES DAILY 100 each 11  . ACCU-CHEK SOFTCLIX LANCETS lancets 1 each by Other route 4 (four) times daily. Use as instructed 100 each 7  . albuterol (PROVENTIL HFA;VENTOLIN HFA) 108 (90 Base) MCG/ACT inhaler Inhale 2 puffs into the lungs every 6 (six) hours as needed for wheezing or shortness of breath. 1 Inhaler 2  . albuterol (PROVENTIL) (2.5 MG/3ML) 0.083% nebulizer solution Take 3 mLs (2.5 mg total) by nebulization every 4 (four) hours as needed for wheezing or shortness of breath. 75 mL 6  . b complex vitamins tablet Take 1 tablet by mouth daily.    . budesonide-formoterol (SYMBICORT) 160-4.5 MCG/ACT inhaler Inhale 2 puffs into the lungs 2 (two) times daily.    Marland Kitchen buPROPion (WELLBUTRIN) 75 MG tablet TAKE 1 TABLET BY MOUTH TWICE A DAY 60 tablet 0  . canagliflozin (INVOKANA) 100 MG TABS tablet 1 tablet before breakfast 30 tablet 3  . cetirizine (ZYRTEC) 10 MG tablet TAKE 1 TABLET BY MOUTH EVERY DAY 30 tablet 5  .  Cholecalciferol (VITAMIN D3) 2000 units capsule Take 1 capsule (2,000 Units total) by mouth 2 (two) times daily. 60 capsule 6  . docusate sodium (COLACE) 100 MG capsule Take 1 capsule (100 mg total) by mouth 2 (two) times daily. 60 capsule 11  . feeding supplement, GLUCERNA SHAKE, (GLUCERNA SHAKE) LIQD Take 237 mLs by mouth 3 (three) times daily between meals. 90 Can 11  . ferrous gluconate (FERGON) 324 MG tablet Take 1 tablet (324 mg total) by mouth daily with breakfast. 90 tablet 3  . fluticasone (FLOVENT HFA) 110 MCG/ACT inhaler Inhale 1 puff into the lungs 2 (two) times daily. 1 Inhaler 6  . glucose blood (ACCU-CHEK GUIDE) test strip Use to check blood sugar 3 times per day. Dx code E11.9 200 each 2  . ibuprofen (IBU) 800 MG tablet Take 1 tablet (800 mg total) by mouth every 8 (eight) hours as needed. 30 tablet 0  . levothyroxine (SYNTHROID, LEVOTHROID) 150 MCG tablet Take 1 tablet (150 mcg total) by mouth daily. 90 tablet 0  . metFORMIN (GLUCOPHAGE-XR) 500 MG 24 hr tablet TAKE 4 TABLETS BY MOUTH EVERY DAY WITH SUPPER 120 tablet 0  . oxyCODONE (ROXICODONE) 15  MG immediate release tablet Take 15 mg by mouth 4 (four) times daily.  0  . pantoprazole (PROTONIX) 40 MG tablet TAKE 1 TABLET EVERY DAY 30 tablet 5  . predniSONE (DELTASONE) 10 MG tablet Take 1 tablet (10 mg total) by mouth daily. 30 tablet 0  . Prenatal Vit-Fe Fum-FA-Omega (PRENATAL MULTI +DHA) 27-0.8-228 MG CAPS Take 1 capsule by mouth daily. 30 capsule 10  . QUEtiapine (SEROQUEL) 100 MG tablet Take 1 tablet (100 mg total) by mouth at bedtime. 7 tablet 0  . traZODone (DESYREL) 100 MG tablet Take 1 tablet (100 mg total) by mouth at bedtime. 30 tablet 1  . valACYclovir (VALTREX) 500 MG tablet TAKE 1 TABLET BY MOUTH TWICE A DAY 60 tablet 2   No current facility-administered medications on file prior to visit.     Allergies:  Allergies  Allergen Reactions  . Depo-Provera [Medroxyprogesterone] Other (See Comments)    Reaction:   Headaches   . Vicodin [Hydrocodone-Acetaminophen] Nausea Only    Review of Systems:  CONSTITUTIONAL: No fevers, chills, night sweats EYES: No visual changes or eye pain ENT: No hearing changes.  No history of nose bleeds.   RESPIRATORY: No cough, wheezing, shortness of breath.   CARDIOVASCULAR: No chest pain, and palpitations.   GI: Negative for abdominal discomfort, blood in stools or black stools.      GU:  No history of incontinence.   MUSCLOSKELETAL: + joint pain or swelling.  No myalgias.   SKIN: Negative for lesions, rash, and itching.   HEMATOLOGY/ONCOLOGY: Negative for prolonged bleeding, bruising easily, and swollen nodes.  No history of cancer.   ENDOCRINE: Negative for cold or heat intolerance, polydipsia or goiter.   PSYCH:  + depression or anxiety symptoms.   NEURO: As Above.   Vital Signs:  BP 104/76   Pulse 78   Ht 5\' 6"  (1.676 m)   Wt 199 lb (90.3 kg)   SpO2 96%   BMI 32.12 kg/m   General:  Tired appearing, but making good eye contact and engaging in conversation  Neurological Exam: MENTAL STATUS including orientation to time, place, person, recent and remote memory, attention span and concentration, language, and fund of knowledge is normal.  Speech is not dysarthric.  CRANIAL NERVES:  Pupils equal round and reactive to light.  Normal conjugate, extra-ocular eye movements in all directions of gaze.  No ptosis.  Face is symmetric.   MOTOR:  Motor strength is 5/5 in all extremities, except RLE with mild hip flexion weakness (old) .  COORDINATION/GAIT:  Gait is stable, assisted with cane.  RLE is externally rotated (old)   Lab Results  Component Value Date   HGBA1C 6.8 (H) 04/27/2017   Lab Results  Component Value Date   TSH 2.77 04/27/2017     IMPRESSION: Seropositive generalized myasthenia gravis (diagnosed 1997) s/p thymectomy.  Previously hospitalized for MG exacerbation in 1997 (PLEX), 2011 (PLEX), 2016 (IVIG).  Clinically, she is doing well and  exam does not show any signs of weakness.   She has been doing very well on her current regimen of azathioprine 150mg /d, prednisone 10mg /d, and mestinon 60mg  TID.  Refills provided  Return to [facility] in 6 months    Thank you for allowing me to participate in patient's care.  If I can answer any additional questions, I would be pleased to do so.    Sincerely,    Alvilda Mckenna K. Posey Pronto, DO

## 2017-07-24 ENCOUNTER — Encounter: Payer: Self-pay | Admitting: *Deleted

## 2017-07-25 DIAGNOSIS — Z79899 Other long term (current) drug therapy: Secondary | ICD-10-CM | POA: Diagnosis not present

## 2017-07-25 DIAGNOSIS — R51 Headache: Secondary | ICD-10-CM | POA: Diagnosis not present

## 2017-07-25 DIAGNOSIS — Z79891 Long term (current) use of opiate analgesic: Secondary | ICD-10-CM | POA: Diagnosis not present

## 2017-07-25 DIAGNOSIS — E119 Type 2 diabetes mellitus without complications: Secondary | ICD-10-CM | POA: Diagnosis not present

## 2017-07-27 IMAGING — US US MFM FETAL BPP W/O NON-STRESS
1 series · 13 of 16 positions shown · non-contrast
Comparison: none

[Series 1: us mfm fetal bpp w/o non-stress · 16 acquisitions, 13 frames shown]
[im 1/16]
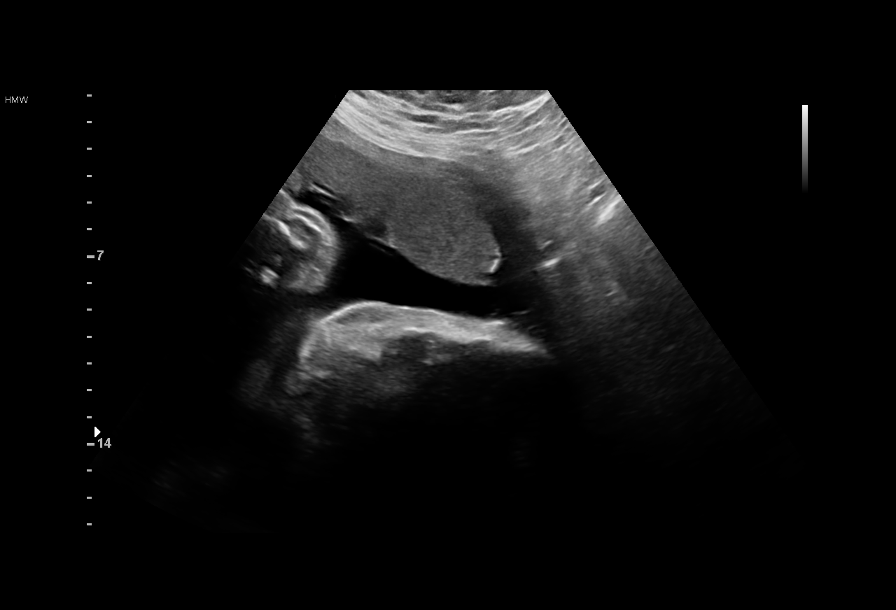
[im 2/16]
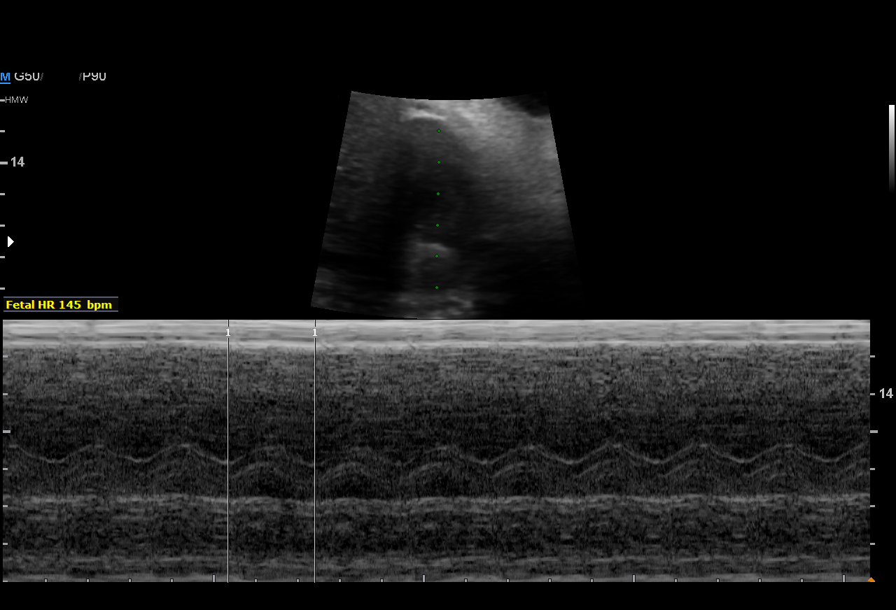
[im 4/16]
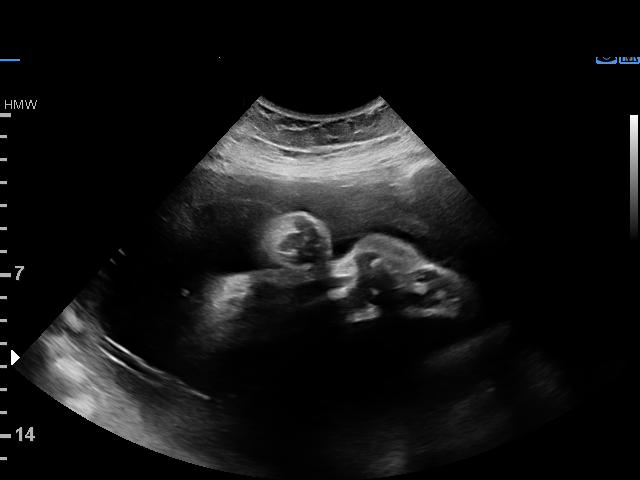
[im 5/16]
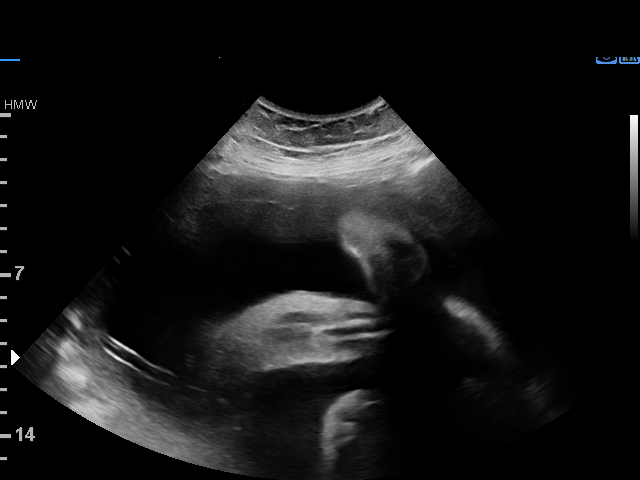
[im 6/16]
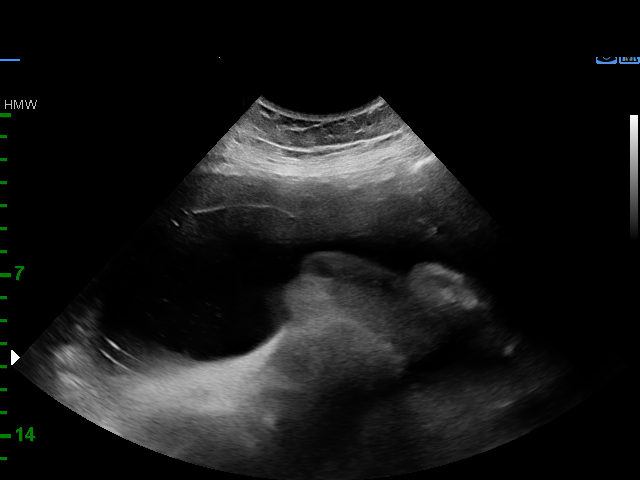
[im 7/16]
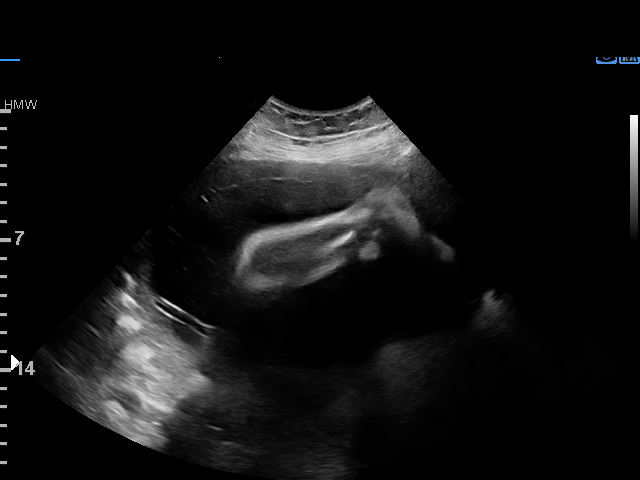
[im 9/16]
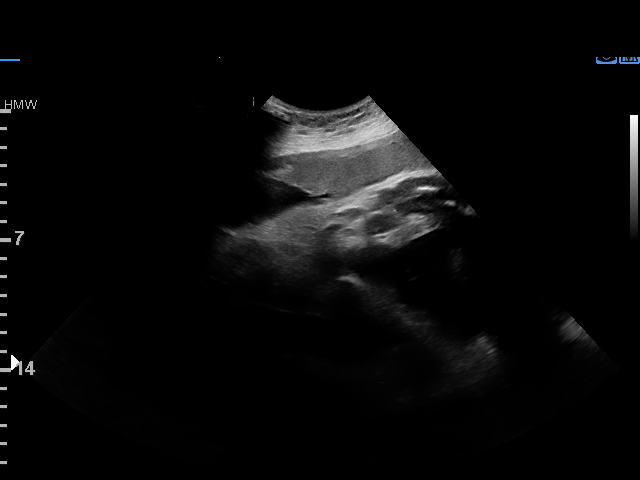
[im 10/16]
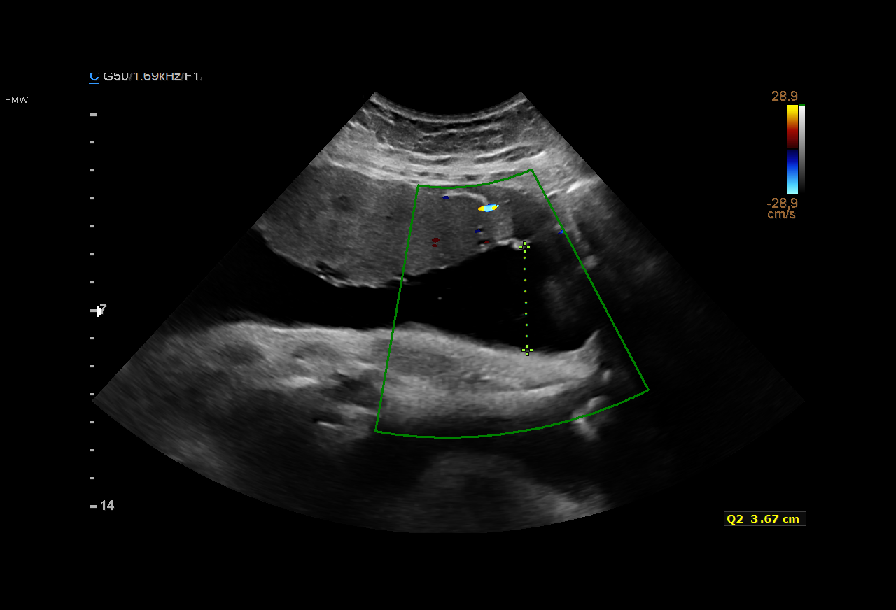
[im 11/16]
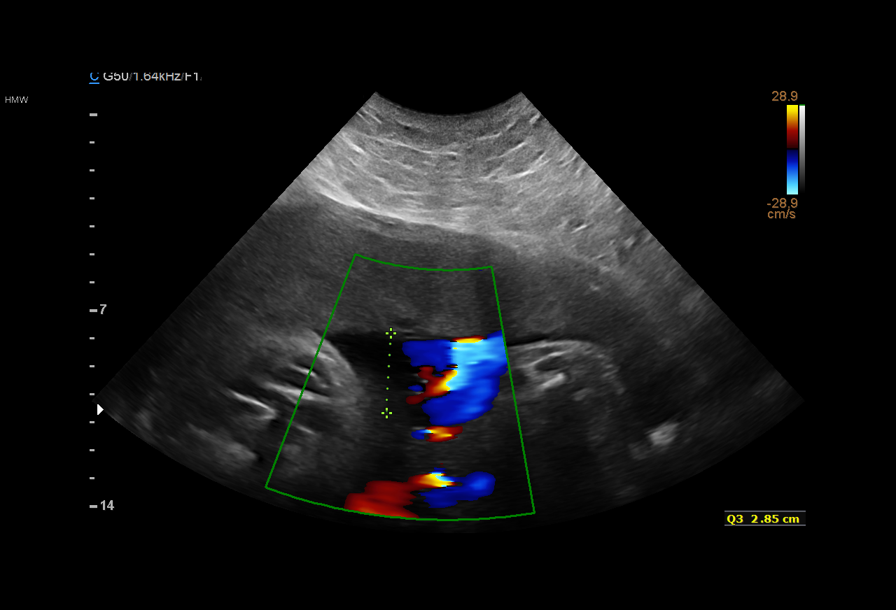
[im 12/16]
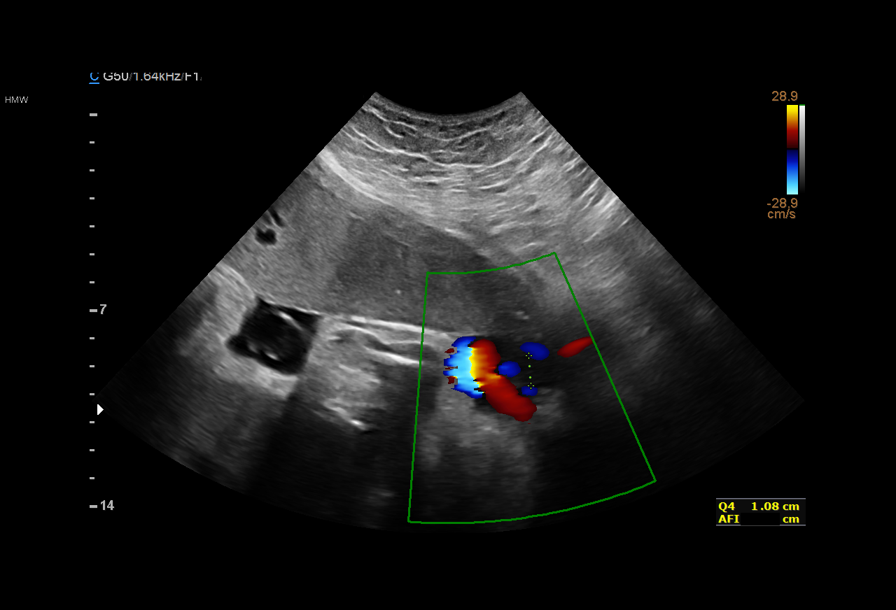
[im 13/16]
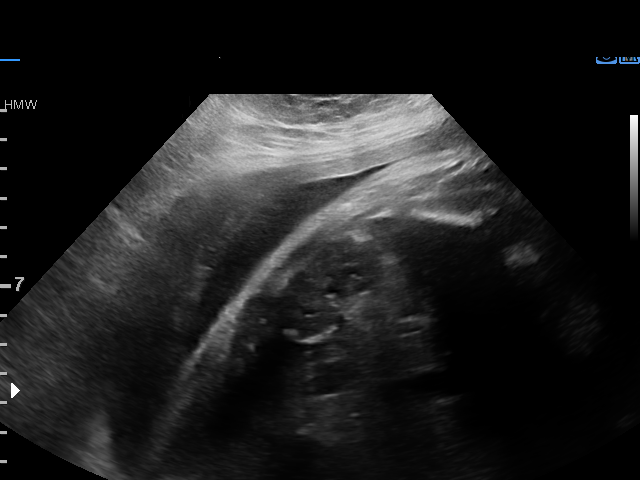
[im 15/16]
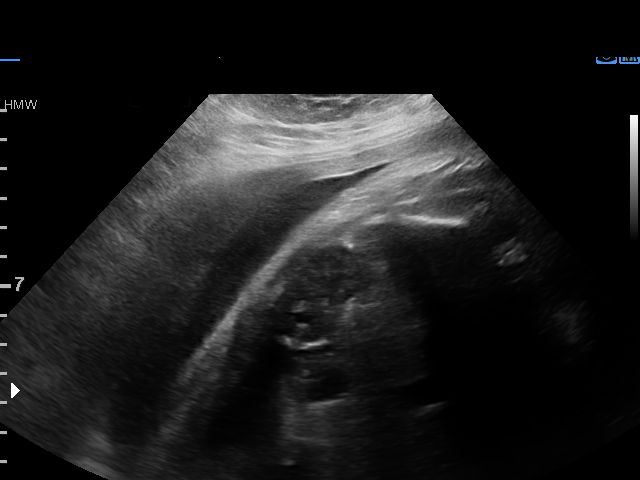
[im 16/16]
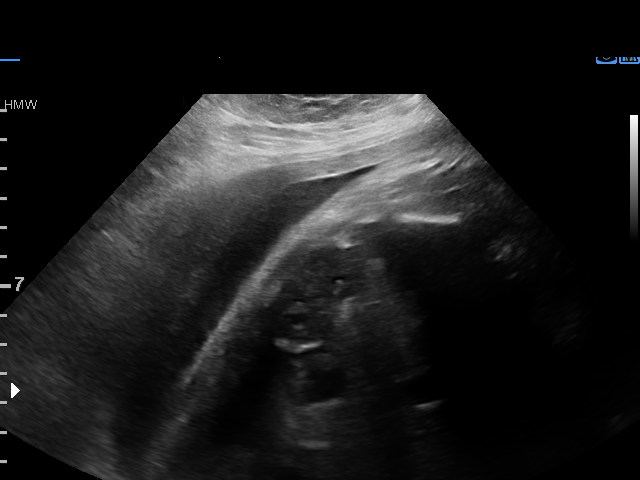

[13 of 16 positions shown; findings below may reference images not displayed]

1  KOROKNAI BATHE              [id number]      [id number]     [id number]
Indications

36 weeks gestation of pregnancy
Hypothyroid (post surgical) on replacement
Axmad
Fudi diabetes, type 2, in pregnancy,
third trimester (insulin); normal fetal ECHO
Advanced maternal age multigravida 35+,
third trimester (low risk NIPS)
Obesity complicating pregnancy, third
trimester
Medical complication of pregnancy:
myasthenia gravis on Mestinon and
Prednisone
OB History

Gravidity:    6         Term:   3         SAB:   2
Fetal Evaluation

Num Of Fetuses:     1
Fetal Heart         145
Rate(bpm):
Cardiac Activity:   Observed
Presentation:       Cephalic

Amniotic Fluid
AFI FV:      Subjectively within normal limits

AFI Sum(cm)     %Tile       Largest Pocket(cm)
12.99           46
RUQ(cm)       RLQ(cm)       LUQ(cm)        LLQ(cm)
5.39
Biophysical Evaluation

Amniotic F.V:   Within normal limits       F. Tone:        Observed
F. Movement:    Observed                   Score:          [DATE]
F. Breathing:   Observed
Gestational Age

LMP:           36w 6d       Date:   12/31/15                 EDD:   10/06/16
Best:          36w 6d    Det. By:   LMP  (12/31/15)          EDD:   10/06/16
Impression

Single IUP at 36w 6d
Cephalic presentation
BPP [DATE]
Normal amniotic fluid volume
Recommendations

Ultrasound for growth next week
Continue antenatal testing as scheduled

## 2017-07-31 ENCOUNTER — Other Ambulatory Visit: Payer: Self-pay

## 2017-08-03 ENCOUNTER — Other Ambulatory Visit (INDEPENDENT_AMBULATORY_CARE_PROVIDER_SITE_OTHER): Payer: Medicare Other

## 2017-08-03 ENCOUNTER — Other Ambulatory Visit: Payer: Self-pay

## 2017-08-03 DIAGNOSIS — E1165 Type 2 diabetes mellitus with hyperglycemia: Secondary | ICD-10-CM | POA: Diagnosis not present

## 2017-08-03 LAB — COMPREHENSIVE METABOLIC PANEL
ALT: 11 U/L (ref 0–35)
AST: 13 U/L (ref 0–37)
Albumin: 3.9 g/dL (ref 3.5–5.2)
Alkaline Phosphatase: 74 U/L (ref 39–117)
BUN: 15 mg/dL (ref 6–23)
CO2: 29 mEq/L (ref 19–32)
Calcium: 9.3 mg/dL (ref 8.4–10.5)
Chloride: 102 mEq/L (ref 96–112)
Creatinine, Ser: 0.69 mg/dL (ref 0.40–1.20)
GFR: 119.74 mL/min (ref >60.00)
Glucose, Bld: 101 mg/dL — ABNORMAL HIGH (ref 70–99)
Potassium: 3.7 mEq/L (ref 3.5–5.1)
Sodium: 136 mEq/L (ref 135–145)
Total Bilirubin: 0.4 mg/dL (ref 0.2–1.2)
Total Protein: 7.5 g/dL (ref 6.0–8.3)

## 2017-08-03 LAB — LIPID PANEL
Cholesterol: 156 mg/dL (ref 0–200)
HDL: 32.6 mg/dL — ABNORMAL LOW (ref >39.00)
LDL Cholesterol: 104 mg/dL — ABNORMAL HIGH (ref 0–99)
NonHDL: 123.58
Total CHOL/HDL Ratio: 5
Triglycerides: 99 mg/dL (ref 0.0–149.0)
VLDL: 19.8 mg/dL (ref 0.0–40.0)

## 2017-08-03 LAB — HEMOGLOBIN A1C: Hgb A1c MFr Bld: 6.3 % (ref 4.6–6.5)

## 2017-08-03 IMAGING — US US MFM OB FOLLOW-UP
1 series · 13 of 28 positions shown · non-contrast
Comparison: none

[Series 1: us mfm ob follow-up · 33 acquisitions, 13 frames shown]
[im 2/33]
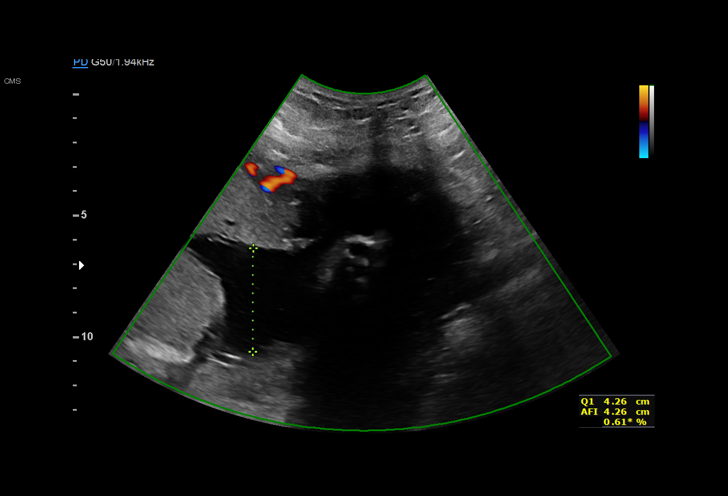
[im 4/33]
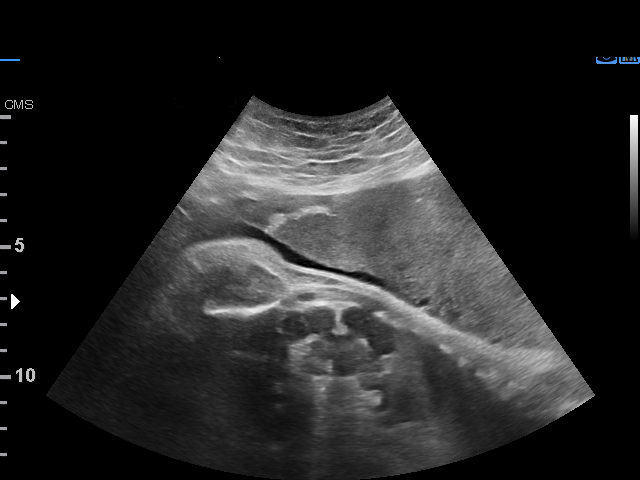
[im 6/33]
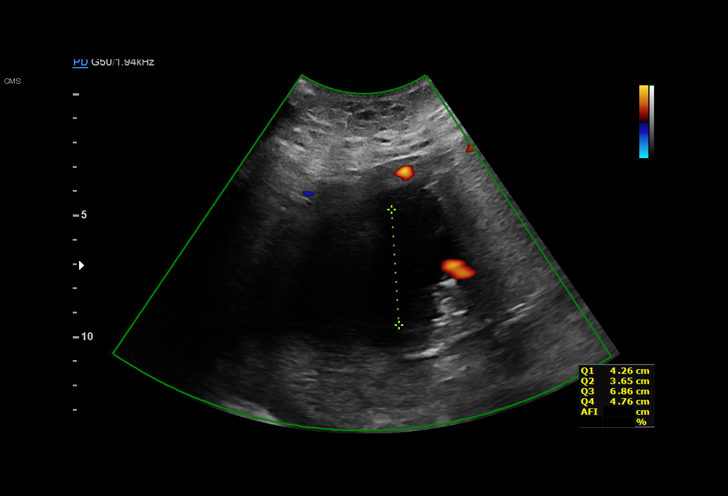
[im 9/33]
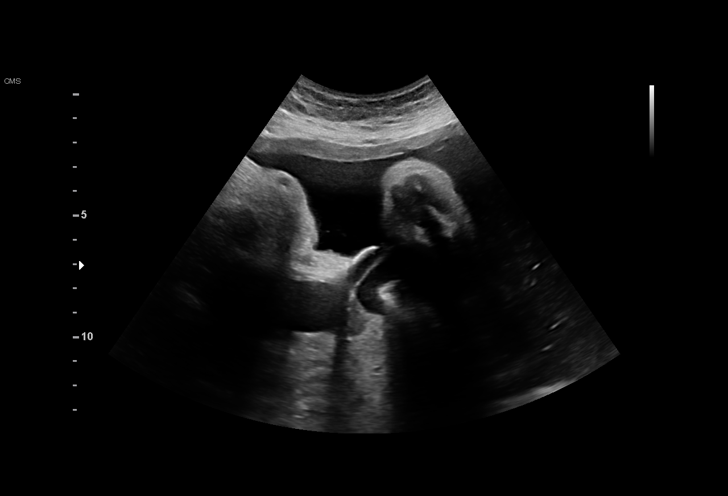
[im 11/33]
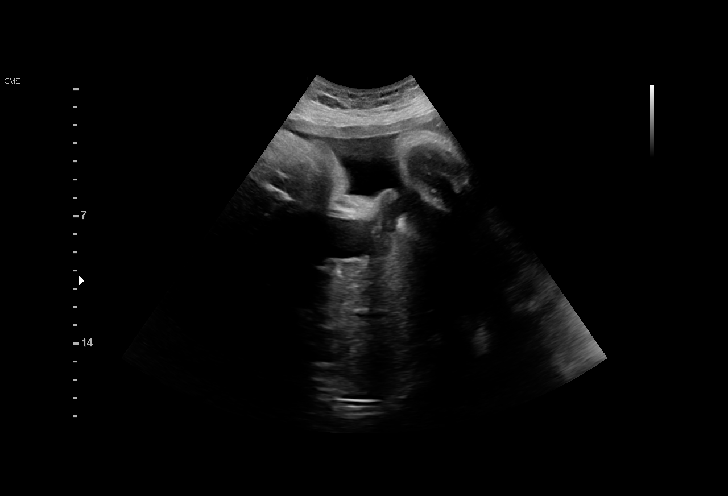
[im 14/33]
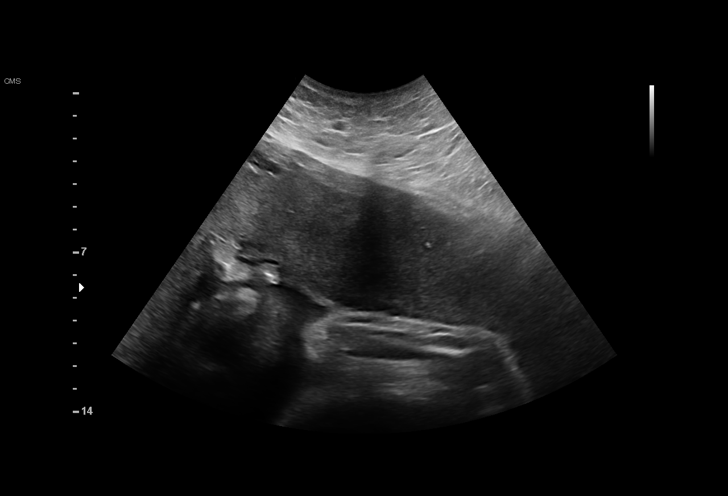
[im 17/33]
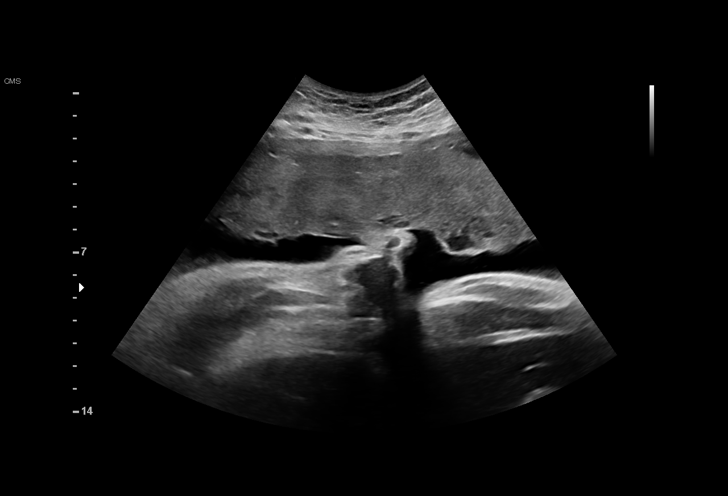
[im 19/33]
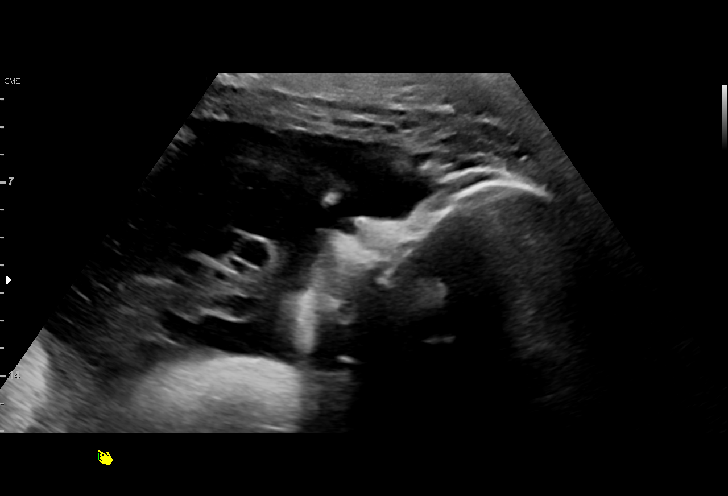
[im 22/33]
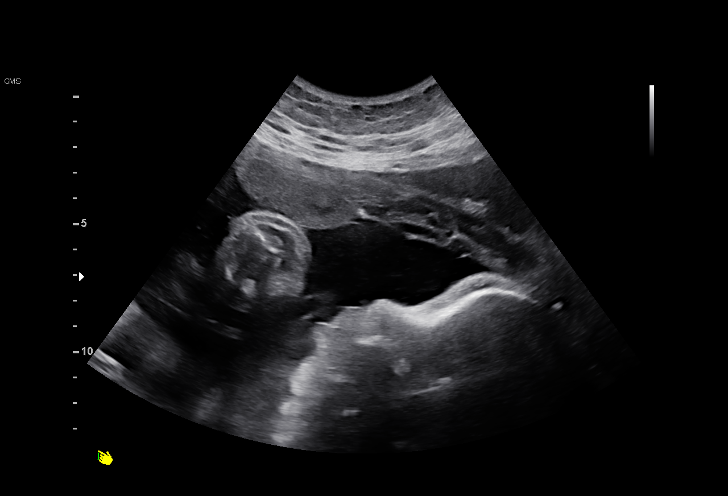
[im 24/33]
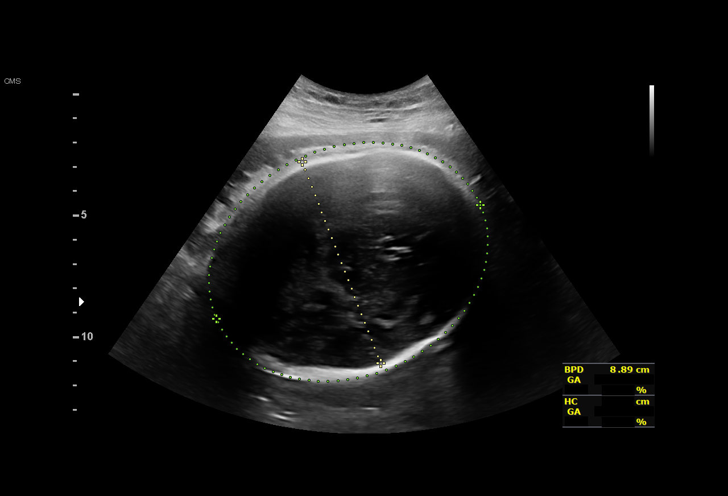
[im 27/33]
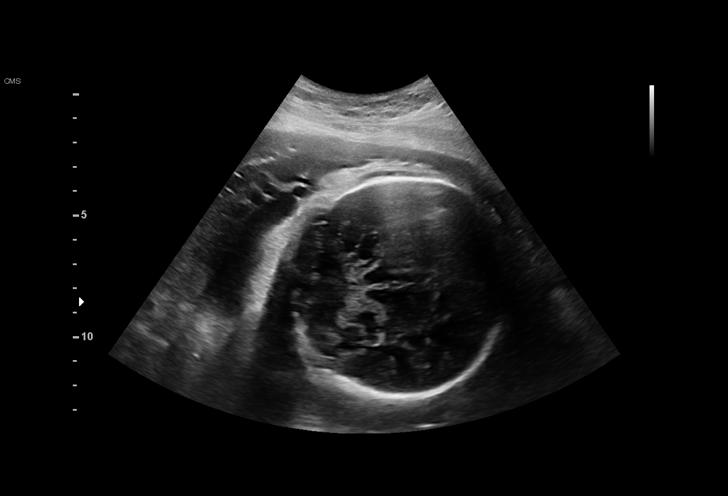
[im 29/33]
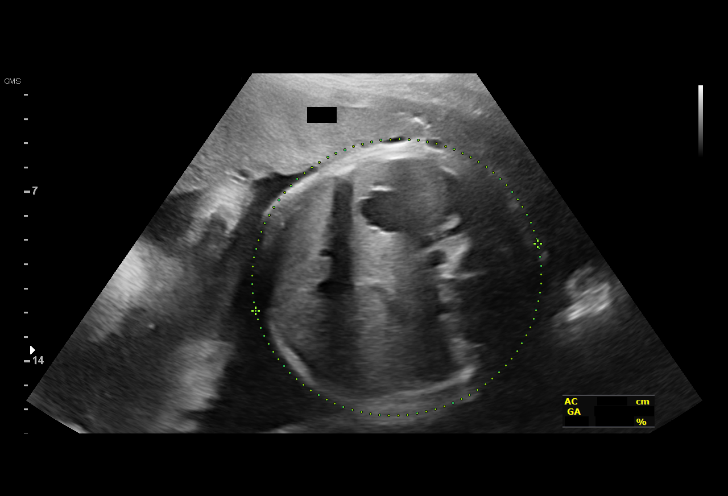
[im 31/33]
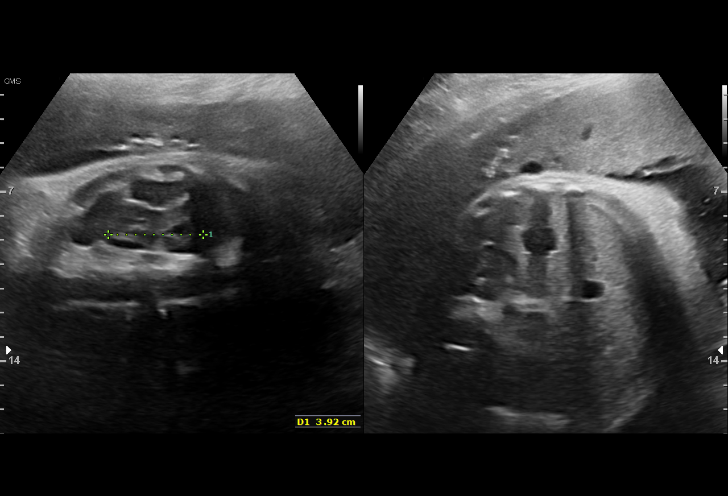

[13 of 28 positions shown; findings below may reference images not displayed]

1  ABOT TYY              [id number]      [id number]     [id number]
2  NYA JUMPER            [id number]      [id number]     [id number]
Indications

37 weeks gestation of pregnancy
Hypothyroid (post surgical) on replacement
Pharm
Layaa diabetes, type 2, in pregnancy,
third trimester (insulin); normal fetal ECHO
Advanced maternal age multigravida 35+,
third trimester (low risk NIPS)
Obesity complicating pregnancy, third
trimester
Medical complication of pregnancy:
myasthenia gravis on Mestinon and
Prednisone
OB History

Gravidity:    6         Term:   3         SAB:   2
Fetal Evaluation

Num Of Fetuses:     1
Fetal Heart         149
Rate(bpm):
Cardiac Activity:   Observed
Presentation:       Cephalic
Placenta:           Anterior, above cervical os
P. Cord Insertion:  Visualized, central

Amniotic Fluid
AFI FV:      Subjectively within normal limits
AFI Sum(cm)     %Tile       Largest Pocket(cm)
19.53           76

RUQ(cm)       RLQ(cm)       LUQ(cm)        LLQ(cm)
4.26
Biophysical Evaluation

Amniotic F.V:   Within normal limits       F. Tone:        Observed
F. Movement:    Observed                   Score:          [DATE]
F. Breathing:   Observed
Biometry

BPD:      90.3  mm     G. Age:  36w 4d         37  %    CI:        72.73   %    70 - 86
FL/HC:      22.1   %    20.9 -
HC:      336.7  mm     G. Age:  38w 4d         47  %    HC/AC:      0.92        0.92 -
AC:      365.8  mm     G. Age:  40w 3d       > 97  %    FL/BPD:     82.5   %    71 - 87
FL:       74.5  mm     G. Age:  38w 1d         58  %    FL/AC:      20.4   %    20 - 24
HUM:      62.5  mm     G. Age:  36w 2d         46  %

Est. FW:    1918  gm      8 lb 4 oz   > 90  %
Gestational Age

LMP:           37w 6d        Date:  12/31/15                 EDD:   10/06/16
U/S Today:     38w 3d                                        EDD:   10/02/16
Best:          37w 6d     Det. By:  LMP  (12/31/15)          EDD:   10/06/16
Anatomy

Cranium:               Appears normal         Aortic Arch:            Previously seen
Cavum:                 Appears normal         Ductal Arch:            Previously seen
Ventricles:            Appears normal         Diaphragm:              Previously seen
Choroid Plexus:        Previously seen        Stomach:                Appears normal, left
sided
Cerebellum:            Previously seen        Abdomen:                Previously seen
Posterior Fossa:       Previously seen        Abdominal Wall:         Appears nml (cord
insert, abd wall)
Nuchal Fold:           Previously seen        Cord Vessels:           Previously seen
Face:                  Appears normal         Kidneys:                Previously seen
(orbits and profile)
Lips:                  Appears normal         Bladder:                Appears normal
Thoracic:              Previously seen        Spine:                  Previously seen
Heart:                 Previously seen        Upper Extremities:      Previously seen
RVOT:                  Previously seen        Lower Extremities:      Previously seen
LVOT:                  Previously seen

Other:  Female gender. Heels and nasal bone previously visualized.
Technically difficult due to maternal habitus and fetal position.
Cervix Uterus Adnexa

Cervix
Not visualized (advanced GA >51wks)

Uterus
No abnormality visualized.

Left Ovary
Not visualized.
Right Ovary
Not visualized.

Cul De Sac:   No free fluid seen.

Adnexa:       No abnormality visualized.
Impression

Singleton intrauterine pregnancy at 37+6 weeks with AMA,
type 2 DM and myasthenia gravis
Normal interval anatomy; anatomic survey complete
Normal amniotic fluid volume
EFW > 90th %tile; AC > 97th %tile; 2442 grams
BPP [DATE]

Recommendations

BPP next week; scheduled for delivery 09/29/2016

## 2017-08-07 ENCOUNTER — Ambulatory Visit (INDEPENDENT_AMBULATORY_CARE_PROVIDER_SITE_OTHER): Payer: Medicare Other | Admitting: Endocrinology

## 2017-08-07 ENCOUNTER — Other Ambulatory Visit: Payer: Self-pay | Admitting: Internal Medicine

## 2017-08-07 ENCOUNTER — Encounter: Payer: Self-pay | Admitting: Endocrinology

## 2017-08-07 VITALS — BP 124/74 | HR 84 | Ht 66.0 in | Wt 198.6 lb

## 2017-08-07 DIAGNOSIS — E1165 Type 2 diabetes mellitus with hyperglycemia: Secondary | ICD-10-CM

## 2017-08-07 DIAGNOSIS — E063 Autoimmune thyroiditis: Secondary | ICD-10-CM

## 2017-08-07 MED ORDER — CANAGLIFLOZIN-METFORMIN HCL ER 50-1000 MG PO TB24: 2.0000 | ORAL_TABLET | Freq: Every day | ORAL | 3 refills | Status: DC

## 2017-08-07 NOTE — Patient Instructions (Addendum)
Low fat foods, like cheese  Check blood sugars on waking up  2-3/7  Also check blood sugars about 2 hours after a meal and do this after different meals by rotation  Recommended blood sugar levels on waking up is 90-130 and about 2 hours after meal is 130-160  Please bring your blood sugar monitor to each visit, thank you
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
## 2017-08-08 ENCOUNTER — Ambulatory Visit: Payer: Self-pay | Admitting: Endocrinology

## 2017-08-15 ENCOUNTER — Other Ambulatory Visit: Payer: Self-pay | Admitting: Endocrinology

## 2017-08-15 ENCOUNTER — Other Ambulatory Visit: Payer: Self-pay | Admitting: Neurology

## 2017-08-20 ENCOUNTER — Telehealth: Payer: Self-pay | Admitting: Emergency Medicine

## 2017-08-20 NOTE — Telephone Encounter (Signed)
Called patient to schedule AWV. Patient declined at this time. 

## 2017-08-23 ENCOUNTER — Ambulatory Visit: Payer: Medicare Other | Admitting: Podiatry

## 2017-08-24 ENCOUNTER — Ambulatory Visit: Payer: Medicare Other | Admitting: Podiatry

## 2017-08-24 ENCOUNTER — Ambulatory Visit: Payer: Self-pay

## 2017-08-24 DIAGNOSIS — M542 Cervicalgia: Secondary | ICD-10-CM | POA: Diagnosis not present

## 2017-08-24 DIAGNOSIS — Z79899 Other long term (current) drug therapy: Secondary | ICD-10-CM | POA: Diagnosis not present

## 2017-08-24 DIAGNOSIS — I9589 Other hypotension: Secondary | ICD-10-CM | POA: Diagnosis not present

## 2017-08-24 DIAGNOSIS — E119 Type 2 diabetes mellitus without complications: Secondary | ICD-10-CM | POA: Diagnosis not present

## 2017-08-24 DIAGNOSIS — M5412 Radiculopathy, cervical region: Secondary | ICD-10-CM | POA: Diagnosis not present

## 2017-08-24 DIAGNOSIS — M5431 Sciatica, right side: Secondary | ICD-10-CM | POA: Diagnosis not present

## 2017-08-24 NOTE — Telephone Encounter (Signed)
See note below  Answer Assessment - Initial Assessment Questions 1. BLOOD PRESSURE: "What is the blood pressure?" "Did you take at least two measurements 5 minutes apart?"      2. ONSET: "When did you take your blood pressure?"    Two weeks 3. HOW: "How did you obtain the blood pressure?" (e.g., visiting nurse, automatic home BP monitor)    Automatic machine 4. HISTORY: "Do you have a history of low blood pressure?" "What is your blood pressure normally?"     Yes 5. MEDICATIONS: "Are you taking any medications for blood pressure?" If yes: "Have they been changed recently?"     No  6. PULSE RATE: "Do you know what your pulse rate is?"      No  7. OTHER SYMPTOMS: "Have you been sick recently?" "Have you had a recent injury?"    no 8. PREGNANCY: "Is there any chance you are pregnant?" "When was your last menstrual period?"    No  Protocols used: LOW BLOOD PRESSURE-A-AH

## 2017-08-24 NOTE — Telephone Encounter (Signed)
Telephone from patient stating that she was at the pain clinic when her blood pressures were 70/50 and 80/ 60.  Patient state that she received IV fluids.  Pt. States that she does have a history of low blood pressure since January when she had surgery.  Denies knowing what pulse rate is at the moment.  Denies taking medications or any other symptoms.  Patient became slightly agitated while assessment questions were being asked.  Recommended pt go to ER to be further assessed.  Patient voiced understanding.  Reason for Disposition . [1] Systolic BP < 80 AND [4] NOT dizzy, lightheaded or weak  Answer Assessment - Initial Assessment Questions 1. BLOOD PRESSURE: "What is the blood pressure?" "Did you take at least two measurements 5 minutes apart?"     Pt. States she was at the pain clinic  When they took her b/p 2. ONSET: "When did you take your blood pressure?"    Two weeks ago.   3. HOW: "How did you obtain the blood pressure?" (e.g., visiting nurse, automatic home BP monitor)    Automatic machine 4. HISTORY: "Do you have a history of low blood pressure?" "What is your blood pressure normally?" pt. States bp has been low since her surgery in January Did not state  Normal   value     Yes 5. MEDICATIONS: "Are you taking any medications for blood pressure?" If yes: "Have they been changed recently?"     No  6. PULSE RATE: "Do you know what your pulse rate is?"      No  7. OTHER SYMPTOMS: "Have you been sick recently?" "Have you had a recent injury?"    no 8. PREGNANCY: "Is there any chance you are pregnant?" "When was your last menstrual period?"    No  Protocols used: LOW BLOOD PRESSURE-A-AH

## 2017-08-25 ENCOUNTER — Other Ambulatory Visit: Payer: Self-pay | Admitting: Internal Medicine

## 2017-08-25 ENCOUNTER — Other Ambulatory Visit: Payer: Self-pay | Admitting: Obstetrics & Gynecology

## 2017-08-25 DIAGNOSIS — O0992 Supervision of high risk pregnancy, unspecified, second trimester: Secondary | ICD-10-CM

## 2017-08-29 ENCOUNTER — Telehealth: Payer: Self-pay | Admitting: Endocrinology

## 2017-08-29 ENCOUNTER — Encounter: Payer: Self-pay | Admitting: Endocrinology

## 2017-08-29 ENCOUNTER — Telehealth: Payer: Self-pay

## 2017-08-29 ENCOUNTER — Ambulatory Visit (INDEPENDENT_AMBULATORY_CARE_PROVIDER_SITE_OTHER): Payer: Medicare Other | Admitting: Endocrinology

## 2017-08-29 VITALS — BP 98/60 | HR 79 | Ht 66.0 in | Wt 199.0 lb

## 2017-08-29 DIAGNOSIS — I952 Hypotension due to drugs: Secondary | ICD-10-CM

## 2017-08-29 NOTE — Telephone Encounter (Signed)
Pt called and asked to clarify if she was taking Invokana or Invokamet. Pt began hollering and yelling at me stating that it was Invokamet and the message was cut and dry. Pt then stated that "she did not reduce her Prednisone and she does not need to come to the office for this." Pt then stated that "Dr. Dwyane Dee is either going to see me today, or he is not. It's that simple." At this time, patient hung up the phone.

## 2017-08-29 NOTE — Telephone Encounter (Signed)
Please advise 

## 2017-08-29 NOTE — Telephone Encounter (Signed)
Spoke with pt she is on the schedule for today at [facility]

## 2017-08-29 NOTE — Patient Instructions (Addendum)
Stop Invokamet and go back on Metformin

## 2017-08-29 NOTE — Telephone Encounter (Signed)
Please clarify what the problem and what symptoms she is having.  Is she taking any blood pressure medicine also.  Also has she reduced her prednisone?  Medication list indicates Invokana and Invokamet both, need to know what she is taking.  Okay to have her come in to get a nurse blood pressure check

## 2017-08-29 NOTE — Telephone Encounter (Signed)
Can work-in at 11.30 am only
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
## 2017-08-29 NOTE — Telephone Encounter (Signed)
Patient went to the pain clinic on Friday. They said it was caused by the new medication Dr. Dwyane Dee prescribed. They told patient to call/see Dr. Dwyane Dee because the patient's blood pressure 70/50 is running too low. Please call patient at ph# [phone] to advise

## 2017-08-29 NOTE — Telephone Encounter (Signed)
When rooming patient, this nurse attempted to verify medications and dosages because patient is having issues with blood pressure. Pt said she was refusing to answer any questions. Pt was informed that in order to adequately treat her, the doctor must first know that all of her medications are correct in her chart. Pt stated "well they shouldn't be in there if they aren't correct." vital signs were then assessed and are noted in chart. Paramedic and MD notified of pt's failure to cooperate.

## 2017-08-31 ENCOUNTER — Ambulatory Visit: Payer: Medicare Other | Admitting: Podiatry

## 2017-09-03 ENCOUNTER — Ambulatory Visit (INDEPENDENT_AMBULATORY_CARE_PROVIDER_SITE_OTHER): Payer: Medicare Other | Admitting: Internal Medicine

## 2017-09-03 ENCOUNTER — Other Ambulatory Visit (INDEPENDENT_AMBULATORY_CARE_PROVIDER_SITE_OTHER): Payer: Medicare Other

## 2017-09-03 ENCOUNTER — Encounter: Payer: Self-pay | Admitting: Internal Medicine

## 2017-09-03 VITALS — BP 80/60 | HR 85 | Temp 98.0°F | Ht 66.0 in | Wt 198.0 lb

## 2017-09-03 DIAGNOSIS — E669 Obesity, unspecified: Secondary | ICD-10-CM

## 2017-09-03 DIAGNOSIS — I951 Orthostatic hypotension: Secondary | ICD-10-CM

## 2017-09-03 DIAGNOSIS — E1169 Type 2 diabetes mellitus with other specified complication: Secondary | ICD-10-CM | POA: Diagnosis not present

## 2017-09-03 LAB — CBC
HCT: 39.2 % (ref 36.0–46.0)
Hemoglobin: 12.7 g/dL (ref 12.0–15.0)
MCHC: 32.4 g/dL (ref 30.0–36.0)
MCV: 83.3 fl (ref 78.0–100.0)
Platelets: 287 10*3/uL (ref 150.0–400.0)
RBC: 4.7 Mil/uL (ref 3.87–5.11)
RDW: 13.7 % (ref 11.5–15.5)
WBC: 5.1 10*3/uL (ref 4.0–10.5)

## 2017-09-03 LAB — VITAMIN B12: Vitamin B-12: 1099 pg/mL — ABNORMAL HIGH (ref 211–911)

## 2017-09-03 LAB — VITAMIN D 25 HYDROXY (VIT D DEFICIENCY, FRACTURES): VITD: 57.53 ng/mL (ref 30.00–100.00)

## 2017-09-03 LAB — FERRITIN: Ferritin: 94.7 ng/mL (ref 10.0–291.0)

## 2017-09-03 NOTE — Assessment & Plan Note (Signed)
Weight is down about 30 pounds since surgery and stable over the last month. Diet is adequate and liquid intake appropriate.

## 2017-09-03 NOTE — Assessment & Plan Note (Signed)
Concerning as she is not on any blood pressure medicines although we do not have an accurate medication list for her. She is not sure what she is taking. We have asked her to write down medications, dose, and how often she takes her medicines and call us with this and bring to all visits. She is resistant but when confronted with medications which are not on her list she agrees. I am concerned that her pain medication is causing some BP problems now that her weight is significantly less and she may need dosage adjustment. She has stopped invokana (possibly?) and this can take 1-2 weeks to get out of her system. Advised to liberalize salt intake. Checking CBC, vitamin levels to make sure we are not missing any causes of her low BP. No signs for acute infection.

## 2017-09-03 NOTE — Assessment & Plan Note (Deleted)
She is likely taking metformin alone for her diabetes currently. We have reaffirmed the need to stop invokana and have asked her to specifically check at home her medications to make sure she is taking appropriately. She is also reminded and asked to make and bring up to date medication list to all visits.

## 2017-09-03 NOTE — Assessment & Plan Note (Signed)
She is likely taking metformin alone for her diabetes currently. We have reaffirmed the need to stop invokana and have asked her to specifically check at home her medications to make sure she is taking appropriately. She is also reminded and asked to make and bring up to date medication list to all visits.

## 2017-09-03 NOTE — Progress Notes (Signed)
   Subjective:    Patient ID: Jade Mathis, female    DOB: 17-Aug-1975, 42 y.o.   MRN: [id number]  HPI The patient is a 42 YO female coming in for blood pressure problems. Last week went to see endocrinology about low BP. She was noted to have low BP at her pain clinic (we do not have those notes available) and got some fluids. She was stopped on invokamet at recent visit and advised to resume metformin only from endo about 4-5 days ago. She originally states she has done this but then states she is still taking metformin and invokana but separately but then recants and states she is just taking metformin. She does not have a medicine list with her. She states it is just whatever is in the computer. Then we are talking about pain and what she is taking for pain and she mentions taking gabapentin but does not know the dose or even how often she takes it. She is down about 30 pounds since her weight loss surgery in January of this year. Weight is stable in the last 1 month or so. She is still getting lightheaded with the blood pressure with standing. Not eating hardly any sugar or salt in her diet per the weight loss clinic.   Review of Systems  Constitutional: Positive for activity change, appetite change and fatigue.  HENT: Negative.   Eyes: Negative.   Respiratory: Negative for cough, chest tightness and shortness of breath.   Cardiovascular: Negative for chest pain, palpitations and leg swelling.  Gastrointestinal: Positive for abdominal pain. Negative for abdominal distention, constipation, diarrhea, nausea and vomiting.  Musculoskeletal: Positive for arthralgias and myalgias.  Skin: Negative.   Neurological: Positive for dizziness, light-headedness and numbness. Negative for syncope and weakness.  Psychiatric/Behavioral: Negative.       Objective:   Physical Exam  Constitutional: She is oriented to person, place, and time. She appears well-developed and well-nourished.  HENT:  Head:  Normocephalic and atraumatic.  Eyes: EOM are normal.  Neck: Normal range of motion.  Cardiovascular: Normal rate and regular rhythm.  Pulmonary/Chest: Effort normal and breath sounds normal. No respiratory distress. She has no wheezes. She has no rales.  Abdominal: Soft. Bowel sounds are normal. She exhibits no distension. There is no tenderness. There is no rebound.  Musculoskeletal: She exhibits no edema.  Neurological: She is alert and oriented to person, place, and time. A cranial nerve deficit is present. Coordination normal.  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  Psychiatric: She has a normal mood and affect.   Vitals:   09/03/17 0832  BP: 90/60  Pulse: 85  Temp: 98 F (36.7 C)  TempSrc: Oral  SpO2: 99%  Weight: 198 lb (89.8 kg)  Height: 5\' 6"  (1.676 m)      Assessment & Plan:

## 2017-09-03 NOTE — Patient Instructions (Signed)
We are going to check some blood work today to make sure nothing there is causing the dizziness.   Give about another week for the invokana to be out of your system.   Add more salt to your diet to help your blood pressure go back up to normal.   It may be that some of the medicines (possibly the pain medicine) might need to be adjusted now that you have lost weight as this medicine can lower the blood pressure some. You are not on any other medicines which should lower the blood pressure that I know of.   Please call us back with a medicine list as we cannot make sure the medicine are okay if we do not know what they are. Also call with the doses (the mg on the bottle) and how often you take them.

## 2017-09-04 ENCOUNTER — Other Ambulatory Visit: Payer: Self-pay | Admitting: Internal Medicine

## 2017-09-06 DIAGNOSIS — H5319 Other subjective visual disturbances: Secondary | ICD-10-CM | POA: Diagnosis not present

## 2017-09-06 DIAGNOSIS — E119 Type 2 diabetes mellitus without complications: Secondary | ICD-10-CM | POA: Diagnosis not present

## 2017-09-06 LAB — HM DIABETES EYE EXAM

## 2017-09-21 ENCOUNTER — Ambulatory Visit (INDEPENDENT_AMBULATORY_CARE_PROVIDER_SITE_OTHER): Payer: Medicare Other | Admitting: Obstetrics and Gynecology

## 2017-09-21 ENCOUNTER — Encounter: Payer: Self-pay | Admitting: Obstetrics and Gynecology

## 2017-09-21 ENCOUNTER — Other Ambulatory Visit (HOSPITAL_COMMUNITY)
Admission: RE | Admit: 2017-09-21 | Discharge: 2017-09-21 | Disposition: A | Discharge status: Home / Self Care | Payer: Medicare Other | Source: Ambulatory Visit | Attending: Obstetrics and Gynecology | Admitting: Obstetrics and Gynecology

## 2017-09-21 VITALS — BP 111/69 | HR 72 | Wt 197.7 lb

## 2017-09-21 DIAGNOSIS — Z01419 Encounter for gynecological examination (general) (routine) without abnormal findings: Secondary | ICD-10-CM | POA: Diagnosis not present

## 2017-09-21 DIAGNOSIS — N939 Abnormal uterine and vaginal bleeding, unspecified: Secondary | ICD-10-CM

## 2017-09-21 DIAGNOSIS — Z029 Encounter for administrative examinations, unspecified: Secondary | ICD-10-CM

## 2017-09-21 DIAGNOSIS — Z01411 Encounter for gynecological examination (general) (routine) with abnormal findings: Secondary | ICD-10-CM

## 2017-09-21 DIAGNOSIS — Z1231 Encounter for screening mammogram for malignant neoplasm of breast: Secondary | ICD-10-CM
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
## 2017-09-21 NOTE — Progress Notes (Signed)
Scheduled Mammogram at the [place] on 10/17/2017  2:30pm. Patient Notified.

## 2017-09-22 LAB — HIV ANTIBODY (ROUTINE TESTING W REFLEX): HIV Screen 4th Generation wRfx: NONREACTIVE

## 2017-09-22 LAB — RPR: RPR Ser Ql: NONREACTIVE

## 2017-09-22 LAB — HEPATITIS B SURFACE ANTIGEN: Hepatitis B Surface Ag: NEGATIVE

## 2017-09-22 LAB — HEPATITIS C ANTIBODY: Hep C Virus Ab: <0.1 s/co ratio (ref 0.0–0.9)

## 2017-09-24 DIAGNOSIS — M545 Low back pain: Secondary | ICD-10-CM | POA: Diagnosis not present

## 2017-09-24 DIAGNOSIS — G8929 Other chronic pain: Secondary | ICD-10-CM | POA: Diagnosis not present

## 2017-09-24 DIAGNOSIS — E119 Type 2 diabetes mellitus without complications: Secondary | ICD-10-CM | POA: Diagnosis not present

## 2017-09-24 DIAGNOSIS — Z79899 Other long term (current) drug therapy: Secondary | ICD-10-CM | POA: Diagnosis not present

## 2017-09-25 LAB — CYTOLOGY - PAP
Chlamydia: NEGATIVE
Diagnosis: NEGATIVE
HPV: NOT DETECTED
Neisseria Gonorrhea: NEGATIVE

## 2017-09-26 ENCOUNTER — Ambulatory Visit (HOSPITAL_COMMUNITY): Payer: Medicare Other

## 2017-09-28 ENCOUNTER — Encounter: Payer: Self-pay | Admitting: Internal Medicine

## 2017-10-01 DIAGNOSIS — Z903 Acquired absence of stomach [part of]: Secondary | ICD-10-CM | POA: Diagnosis not present

## 2017-10-01 DIAGNOSIS — E559 Vitamin D deficiency, unspecified: Secondary | ICD-10-CM | POA: Diagnosis not present

## 2017-10-01 DIAGNOSIS — K912 Postsurgical malabsorption, not elsewhere classified: Secondary | ICD-10-CM | POA: Diagnosis not present

## 2017-10-12 ENCOUNTER — Ambulatory Visit: Payer: Self-pay | Admitting: Internal Medicine

## 2017-10-17 ENCOUNTER — Ambulatory Visit: Payer: Self-pay

## 2017-10-22 ENCOUNTER — Other Ambulatory Visit: Payer: Self-pay

## 2017-10-25 DIAGNOSIS — Z79899 Other long term (current) drug therapy: Secondary | ICD-10-CM | POA: Diagnosis not present

## 2017-10-25 DIAGNOSIS — M545 Low back pain: Secondary | ICD-10-CM | POA: Diagnosis not present

## 2017-10-25 DIAGNOSIS — E119 Type 2 diabetes mellitus without complications: Secondary | ICD-10-CM | POA: Diagnosis not present

## 2017-10-25 DIAGNOSIS — G8929 Other chronic pain: Secondary | ICD-10-CM | POA: Diagnosis not present

## 2017-10-26 ENCOUNTER — Ambulatory Visit: Payer: Self-pay | Admitting: Endocrinology

## 2017-11-01 DIAGNOSIS — M25532 Pain in left wrist: Secondary | ICD-10-CM | POA: Diagnosis not present

## 2017-11-01 DIAGNOSIS — M654 Radial styloid tenosynovitis [de Quervain]: Secondary | ICD-10-CM | POA: Diagnosis not present

## 2017-11-02 ENCOUNTER — Other Ambulatory Visit: Payer: Self-pay

## 2017-11-05 ENCOUNTER — Other Ambulatory Visit (INDEPENDENT_AMBULATORY_CARE_PROVIDER_SITE_OTHER): Payer: Medicare Other

## 2017-11-05 ENCOUNTER — Encounter: Payer: Self-pay | Admitting: Internal Medicine

## 2017-11-05 ENCOUNTER — Ambulatory Visit (INDEPENDENT_AMBULATORY_CARE_PROVIDER_SITE_OTHER): Payer: Medicare Other | Admitting: Internal Medicine

## 2017-11-05 VITALS — BP 122/72 | HR 82 | Temp 98.2°F | Ht 66.0 in | Wt 199.0 lb

## 2017-11-05 DIAGNOSIS — E669 Obesity, unspecified: Secondary | ICD-10-CM

## 2017-11-05 DIAGNOSIS — E1169 Type 2 diabetes mellitus with other specified complication: Secondary | ICD-10-CM | POA: Diagnosis not present

## 2017-11-05 LAB — HEMOGLOBIN A1C: Hgb A1c MFr Bld: 6.7 % — ABNORMAL HIGH (ref 4.6–6.5)

## 2017-11-05 NOTE — Progress Notes (Signed)
   Subjective:    Patient ID: Jade Mathis, female    DOB: 1975-06-27, 42 y.o.   MRN: [id number]  HPI The patient is a 42 YO female coming in for follow up of sugars. She is taking metformin alone now 2000 mg daily. She stopped invokana about 2 months ago. Since that time her sugars have been running 250-300 most of the time. She did not contact anyone about this. Did not make any changes. Diet is same as before. She is feeling poorly for the last 2 months due to sugars with some fogging of her brain, abdominal pain and distention, nausea. Denies problems with hypotension now that she is off invokana. Did not bring medication list with her.   Review of Systems  Constitutional: Positive for fatigue.  HENT: Negative.   Eyes: Negative.   Respiratory: Negative for cough, chest tightness and shortness of breath.   Cardiovascular: Negative for chest pain, palpitations and leg swelling.  Gastrointestinal: Negative for abdominal distention, abdominal pain, constipation, diarrhea, nausea and vomiting.  Musculoskeletal: Negative.   Skin: Negative.   Neurological: Negative.   Psychiatric/Behavioral: Positive for decreased concentration.      Objective:   Physical Exam  Constitutional: She is oriented to person, place, and time. She appears well-developed and well-nourished.  HENT:  Head: Normocephalic and atraumatic.  Eyes: EOM are normal.  Neck: Normal range of motion.  Cardiovascular: Normal rate and regular rhythm.  Pulmonary/Chest: Effort normal and breath sounds normal. No respiratory distress. She has no wheezes. She has no rales.  Abdominal: Soft. Bowel sounds are normal. She exhibits no distension. There is no tenderness. There is no rebound.  Musculoskeletal: She exhibits no edema.  Neurological: She is alert and oriented to person, place, and time. Coordination normal.  Skin: Skin is warm and dry.   Vitals:   11/05/17 1421  BP: 122/72  Pulse: 82  Temp: 98.2 F (36.8 C)    TempSrc: Oral  SpO2: 97%  Weight: 199 lb (90.3 kg)  Height: 5\' 6"  (1.676 m)      Assessment & Plan:

## 2017-11-05 NOTE — Patient Instructions (Signed)
We are checking the blood work today and will call you back about the results.   Next time, do not wait months with the sugars being high without reaching out to one of your doctors for advice (either [place] or Dr. Ronnie Derby office) so that we can help you feel better.

## 2017-11-05 NOTE — Telephone Encounter (Signed)
Pt called in stating letter is needed for Monday August 26th 2019 excusing her from jury duty due to back issues and cannot sit straight or on hard surface and PTSD (that she can't see with people in close proximity to her).

## 2017-11-06 ENCOUNTER — Ambulatory Visit: Payer: Self-pay | Admitting: Endocrinology

## 2017-11-06 NOTE — Assessment & Plan Note (Signed)
Checking HgA1c today as she is reporting 200 sugars for several months now off invokana. Adjust as needed. Diet is stable from prior. Weight is same as 2 months ago.

## 2017-11-07 ENCOUNTER — Telehealth: Payer: Self-pay

## 2017-11-07 NOTE — Telephone Encounter (Signed)
Patient notified of lab results and provider response- no changes now

## 2017-11-07 NOTE — Telephone Encounter (Signed)
LVM for patient to call back for lab results and the results have been released through [employer]

## 2017-11-07 NOTE — Telephone Encounter (Signed)
Lab result done

## 2017-11-07 NOTE — Telephone Encounter (Signed)
Copied from Jade Mathis [id number]. Topic: General - Other >> Nov 06, 2017  4:11 PM Carolyn Stare wrote:  Would like a call back about lab results

## 2017-11-16 ENCOUNTER — Other Ambulatory Visit: Payer: Self-pay | Admitting: Endocrinology

## 2017-11-21 DIAGNOSIS — I10 Essential (primary) hypertension: Secondary | ICD-10-CM | POA: Diagnosis not present

## 2017-11-21 DIAGNOSIS — E785 Hyperlipidemia, unspecified: Secondary | ICD-10-CM | POA: Diagnosis not present

## 2017-11-21 DIAGNOSIS — E119 Type 2 diabetes mellitus without complications: Secondary | ICD-10-CM | POA: Diagnosis not present

## 2017-11-21 DIAGNOSIS — Z903 Acquired absence of stomach [part of]: Secondary | ICD-10-CM | POA: Diagnosis not present

## 2017-11-21 DIAGNOSIS — K219 Gastro-esophageal reflux disease without esophagitis: Secondary | ICD-10-CM | POA: Diagnosis not present

## 2017-11-23 DIAGNOSIS — M545 Low back pain: Secondary | ICD-10-CM | POA: Diagnosis not present

## 2017-11-23 DIAGNOSIS — E119 Type 2 diabetes mellitus without complications: Secondary | ICD-10-CM | POA: Diagnosis not present

## 2017-11-23 DIAGNOSIS — G8929 Other chronic pain: Secondary | ICD-10-CM | POA: Diagnosis not present

## 2017-11-23 DIAGNOSIS — M542 Cervicalgia: Secondary | ICD-10-CM | POA: Diagnosis not present

## 2017-11-23 DIAGNOSIS — Z79899 Other long term (current) drug therapy: Secondary | ICD-10-CM | POA: Diagnosis not present

## 2017-11-29 DIAGNOSIS — Z713 Dietary counseling and surveillance: Secondary | ICD-10-CM | POA: Diagnosis not present

## 2017-11-29 DIAGNOSIS — Z9884 Bariatric surgery status: Secondary | ICD-10-CM | POA: Diagnosis not present

## 2017-12-07 ENCOUNTER — Ambulatory Visit: Payer: Medicare Other | Admitting: Podiatry

## 2017-12-09 ENCOUNTER — Other Ambulatory Visit: Payer: Self-pay | Admitting: Internal Medicine

## 2017-12-10 ENCOUNTER — Other Ambulatory Visit (INDEPENDENT_AMBULATORY_CARE_PROVIDER_SITE_OTHER): Payer: Medicare Other

## 2017-12-10 DIAGNOSIS — E1165 Type 2 diabetes mellitus with hyperglycemia: Secondary | ICD-10-CM | POA: Diagnosis not present

## 2017-12-10 DIAGNOSIS — E063 Autoimmune thyroiditis: Secondary | ICD-10-CM | POA: Diagnosis not present

## 2017-12-10 LAB — COMPREHENSIVE METABOLIC PANEL
ALT: 11 U/L (ref 0–35)
AST: 11 U/L (ref 0–37)
Albumin: 3.9 g/dL (ref 3.5–5.2)
Alkaline Phosphatase: 72 U/L (ref 39–117)
BUN: 21 mg/dL (ref 6–23)
CO2: 30 mEq/L (ref 19–32)
Calcium: 9 mg/dL (ref 8.4–10.5)
Chloride: 101 mEq/L (ref 96–112)
Creatinine, Ser: 0.8 mg/dL (ref 0.40–1.20)
GFR: 100.78 mL/min (ref >60.00)
Glucose, Bld: 117 mg/dL — ABNORMAL HIGH (ref 70–99)
Potassium: 3.7 mEq/L (ref 3.5–5.1)
Sodium: 136 mEq/L (ref 135–145)
Total Bilirubin: 0.4 mg/dL (ref 0.2–1.2)
Total Protein: 7.4 g/dL (ref 6.0–8.3)

## 2017-12-10 LAB — MICROALBUMIN / CREATININE URINE RATIO
Creatinine,U: 99.5 mg/dL
Microalb Creat Ratio: 0.7 mg/g (ref 0.0–30.0)
Microalb, Ur: <0.7 mg/dL (ref 0.0–1.9)

## 2017-12-10 LAB — HEMOGLOBIN A1C: Hgb A1c MFr Bld: 6.9 % — ABNORMAL HIGH (ref 4.6–6.5)

## 2017-12-10 LAB — T4, FREE: Free T4: 0.87 ng/dL (ref 0.60–1.60)

## 2017-12-10 LAB — TSH: TSH: 6.01 u[IU]/mL — ABNORMAL HIGH (ref 0.35–4.50)

## 2017-12-18 ENCOUNTER — Encounter: Payer: Self-pay | Admitting: Endocrinology

## 2017-12-18 ENCOUNTER — Ambulatory Visit (INDEPENDENT_AMBULATORY_CARE_PROVIDER_SITE_OTHER): Payer: Medicare Other | Admitting: Endocrinology

## 2017-12-18 VITALS — BP 124/76 | HR 77 | Ht 66.0 in | Wt 198.0 lb

## 2017-12-18 DIAGNOSIS — E1165 Type 2 diabetes mellitus with hyperglycemia: Secondary | ICD-10-CM | POA: Diagnosis not present

## 2017-12-18 DIAGNOSIS — E063 Autoimmune thyroiditis: Secondary | ICD-10-CM

## 2017-12-18 MED ORDER — LEVOTHYROXINE SODIUM 175 MCG PO TABS: 175.0000 ug | ORAL_TABLET | Freq: Every day | ORAL | 2 refills | Status: DC

## 2017-12-18 MED ORDER — ACCU-CHEK AVIVA PLUS VI STRP: ORAL_STRIP | 11 refills | Status: DC

## 2017-12-18 NOTE — Patient Instructions (Signed)
Check blood sugars on waking up 3 days a week  Also check blood sugars about 2 hours after meals and do this after different meals by rotation  Recommended blood sugar levels on waking up are 90-130 and about 2 hours after meal is 130-160  Please bring your blood sugar monitor to each visit, thank you   
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
## 2017-12-19 ENCOUNTER — Encounter: Payer: Self-pay | Admitting: Family

## 2017-12-19 ENCOUNTER — Ambulatory Visit (INDEPENDENT_AMBULATORY_CARE_PROVIDER_SITE_OTHER): Payer: Medicare Other | Admitting: Family

## 2017-12-19 VITALS — BP 132/84 | HR 110 | Temp 99.5°F | Ht 66.0 in

## 2017-12-19 DIAGNOSIS — G43909 Migraine, unspecified, not intractable, without status migrainosus: Secondary | ICD-10-CM

## 2017-12-19 MED ORDER — KETOROLAC TROMETHAMINE 30 MG/ML IJ SOLN
30.0000 mg | Freq: Once | INTRAMUSCULAR | Status: AC
  Administered 2017-12-19: 30 mg via INTRAMUSCULAR

## 2017-12-19 NOTE — Patient Instructions (Signed)
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
## 2017-12-21 DIAGNOSIS — Z79891 Long term (current) use of opiate analgesic: Secondary | ICD-10-CM | POA: Diagnosis not present

## 2017-12-21 DIAGNOSIS — M5412 Radiculopathy, cervical region: Secondary | ICD-10-CM | POA: Diagnosis not present

## 2017-12-21 DIAGNOSIS — M5431 Sciatica, right side: Secondary | ICD-10-CM | POA: Diagnosis not present

## 2017-12-21 DIAGNOSIS — Z79899 Other long term (current) drug therapy: Secondary | ICD-10-CM | POA: Diagnosis not present

## 2018-01-02 ENCOUNTER — Other Ambulatory Visit: Payer: Self-pay | Admitting: Endocrinology

## 2018-01-07 ENCOUNTER — Other Ambulatory Visit: Payer: Self-pay | Admitting: Internal Medicine

## 2018-01-09 DIAGNOSIS — Z903 Acquired absence of stomach [part of]: Secondary | ICD-10-CM | POA: Diagnosis not present

## 2018-01-09 DIAGNOSIS — Z9884 Bariatric surgery status: Secondary | ICD-10-CM | POA: Diagnosis not present

## 2018-01-09 DIAGNOSIS — Z713 Dietary counseling and surveillance: Secondary | ICD-10-CM | POA: Diagnosis not present

## 2018-01-10 ENCOUNTER — Telehealth: Payer: Self-pay | Admitting: Neurology

## 2018-01-10 NOTE — Telephone Encounter (Signed)
Patient is calling in about independent living forms. She stated that they have not received anything. Please call her back at [phone]. Thanks!

## 2018-01-11 ENCOUNTER — Encounter: Payer: Self-pay | Admitting: *Deleted

## 2018-01-11 NOTE — Telephone Encounter (Signed)
Sent message via My Chart letting patient know that I have not received any forms.

## 2018-01-16 ENCOUNTER — Other Ambulatory Visit: Payer: Self-pay | Admitting: Obstetrics & Gynecology

## 2018-01-16 DIAGNOSIS — O0992 Supervision of high risk pregnancy, unspecified, second trimester: Secondary | ICD-10-CM

## 2018-01-18 DIAGNOSIS — M5412 Radiculopathy, cervical region: Secondary | ICD-10-CM | POA: Diagnosis not present

## 2018-01-18 DIAGNOSIS — Z79899 Other long term (current) drug therapy: Secondary | ICD-10-CM | POA: Diagnosis not present

## 2018-01-18 DIAGNOSIS — Z79891 Long term (current) use of opiate analgesic: Secondary | ICD-10-CM | POA: Diagnosis not present

## 2018-01-18 DIAGNOSIS — M5431 Sciatica, right side: Secondary | ICD-10-CM | POA: Diagnosis not present

## 2018-01-29 ENCOUNTER — Telehealth: Payer: Self-pay | Admitting: Neurology

## 2018-01-29 NOTE — Telephone Encounter (Signed)
Patient had to reschedule appointment and will need her medications refilled until her February appointment? She is on the wait list. Thanks

## 2018-01-30 ENCOUNTER — Encounter: Payer: Self-pay | Admitting: *Deleted

## 2018-01-30 NOTE — Telephone Encounter (Signed)
Note sent via My Chart that patient has refills left on her prescriptions.

## 2018-02-01 ENCOUNTER — Ambulatory Visit: Payer: Self-pay | Admitting: Neurology

## 2018-02-11 DIAGNOSIS — M5431 Sciatica, right side: Secondary | ICD-10-CM | POA: Diagnosis not present

## 2018-02-11 DIAGNOSIS — Z79899 Other long term (current) drug therapy: Secondary | ICD-10-CM | POA: Diagnosis not present

## 2018-02-11 DIAGNOSIS — Z79891 Long term (current) use of opiate analgesic: Secondary | ICD-10-CM | POA: Diagnosis not present

## 2018-02-11 DIAGNOSIS — M5412 Radiculopathy, cervical region: Secondary | ICD-10-CM | POA: Diagnosis not present

## 2018-02-12 ENCOUNTER — Emergency Department (HOSPITAL_COMMUNITY): Payer: Medicare Other

## 2018-02-12 ENCOUNTER — Other Ambulatory Visit: Payer: Self-pay

## 2018-02-12 ENCOUNTER — Other Ambulatory Visit: Payer: Self-pay | Admitting: Internal Medicine

## 2018-02-12 ENCOUNTER — Emergency Department (HOSPITAL_COMMUNITY)
Admission: EM | Admit: 2018-02-12 | Discharge: 2018-02-12 | Disposition: A | Discharge status: Home / Self Care | Payer: Medicare Other | Attending: Emergency Medicine | Admitting: Emergency Medicine

## 2018-02-12 ENCOUNTER — Encounter (HOSPITAL_COMMUNITY): Payer: Self-pay

## 2018-02-12 DIAGNOSIS — Y999 Unspecified external cause status: Secondary | ICD-10-CM | POA: Insufficient documentation

## 2018-02-12 DIAGNOSIS — R0602 Shortness of breath: Secondary | ICD-10-CM | POA: Diagnosis not present

## 2018-02-12 DIAGNOSIS — F319 Bipolar disorder, unspecified: Secondary | ICD-10-CM | POA: Insufficient documentation

## 2018-02-12 DIAGNOSIS — R110 Nausea: Secondary | ICD-10-CM | POA: Insufficient documentation

## 2018-02-12 DIAGNOSIS — Y929 Unspecified place or not applicable: Secondary | ICD-10-CM | POA: Diagnosis not present

## 2018-02-12 DIAGNOSIS — R1084 Generalized abdominal pain: Secondary | ICD-10-CM | POA: Diagnosis not present

## 2018-02-12 DIAGNOSIS — Z79899 Other long term (current) drug therapy: Secondary | ICD-10-CM | POA: Diagnosis not present

## 2018-02-12 DIAGNOSIS — M25561 Pain in right knee: Secondary | ICD-10-CM | POA: Insufficient documentation

## 2018-02-12 DIAGNOSIS — R51 Headache: Secondary | ICD-10-CM | POA: Insufficient documentation

## 2018-02-12 DIAGNOSIS — R42 Dizziness and giddiness: Secondary | ICD-10-CM | POA: Diagnosis not present

## 2018-02-12 DIAGNOSIS — R0789 Other chest pain: Secondary | ICD-10-CM | POA: Insufficient documentation

## 2018-02-12 DIAGNOSIS — S299XXA Unspecified injury of thorax, initial encounter: Secondary | ICD-10-CM | POA: Diagnosis not present

## 2018-02-12 DIAGNOSIS — R519 Headache, unspecified: Secondary | ICD-10-CM

## 2018-02-12 DIAGNOSIS — Z743 Need for continuous supervision: Secondary | ICD-10-CM | POA: Diagnosis not present

## 2018-02-12 DIAGNOSIS — M545 Low back pain: Secondary | ICD-10-CM | POA: Diagnosis not present

## 2018-02-12 DIAGNOSIS — E119 Type 2 diabetes mellitus without complications: Secondary | ICD-10-CM | POA: Insufficient documentation

## 2018-02-12 DIAGNOSIS — Z7984 Long term (current) use of oral hypoglycemic drugs: Secondary | ICD-10-CM | POA: Insufficient documentation

## 2018-02-12 DIAGNOSIS — Y939 Activity, unspecified: Secondary | ICD-10-CM | POA: Insufficient documentation

## 2018-02-12 DIAGNOSIS — M542 Cervicalgia: Secondary | ICD-10-CM | POA: Insufficient documentation

## 2018-02-12 DIAGNOSIS — M25512 Pain in left shoulder: Secondary | ICD-10-CM | POA: Diagnosis not present

## 2018-02-12 DIAGNOSIS — S8991XA Unspecified injury of right lower leg, initial encounter: Secondary | ICD-10-CM | POA: Diagnosis not present

## 2018-02-12 DIAGNOSIS — R079 Chest pain, unspecified: Secondary | ICD-10-CM | POA: Diagnosis not present

## 2018-02-12 DIAGNOSIS — T07XXXA Unspecified multiple injuries, initial encounter: Secondary | ICD-10-CM | POA: Diagnosis present

## 2018-02-12 DIAGNOSIS — M549 Dorsalgia, unspecified: Secondary | ICD-10-CM

## 2018-02-12 DIAGNOSIS — G43909 Migraine, unspecified, not intractable, without status migrainosus: Secondary | ICD-10-CM | POA: Diagnosis not present

## 2018-02-12 DIAGNOSIS — M2550 Pain in unspecified joint: Secondary | ICD-10-CM

## 2018-02-12 DIAGNOSIS — S4992XA Unspecified injury of left shoulder and upper arm, initial encounter: Secondary | ICD-10-CM | POA: Diagnosis not present

## 2018-02-12 MED ORDER — METHOCARBAMOL 500 MG PO TABS: 500.0000 mg | ORAL_TABLET | Freq: Two times a day (BID) | ORAL | 0 refills | Status: DC | PRN

## 2018-02-12 MED ORDER — OXYCODONE-ACETAMINOPHEN 5-325 MG PO TABS
1.0000 | ORAL_TABLET | Freq: Once | ORAL | Status: DC
  Filled 2018-02-12: qty 1

## 2018-02-12 MED ORDER — CYCLOBENZAPRINE HCL 10 MG PO TABS
10.0000 mg | ORAL_TABLET | Freq: Once | ORAL | Status: DC
  Filled 2018-02-12: qty 1

## 2018-02-12 MED ORDER — ONDANSETRON 4 MG PO TBDP
4.0000 mg | ORAL_TABLET | Freq: Once | ORAL | Status: DC
  Filled 2018-02-12: qty 1

## 2018-02-12 MED ORDER — ONDANSETRON HCL 4 MG PO TABS: 4.0000 mg | ORAL_TABLET | Freq: Three times a day (TID) | ORAL | 0 refills | Status: DC | PRN

## 2018-02-12 NOTE — Discharge Instructions (Addendum)
Take your home medicines as prescribed as needed for pain.  You may take Robaxin (methocarbamol) up to twice daily as needed for muscle spasms. This medication may make you drowsy, so I typically only recommended at night. If this medication makes you drowsy throughout the day, no driving, drinking alcohol, or operating heavy machinery. You may also cut these tablets in half.  Apply ice or heat to areas of soreness for the next few days, whichever feels best. Do some gentle stretching throughout the day, especially during hot showers or baths. Take short frequent walks and avoid prolonged periods of sitting or laying. Expect to be sore for the next few days.  Do not be surprised if you wake up tomorrow feeling worse than you do today.  With regards to concussion symptoms, stay in a dimly lit room with minimal stimuli.  Cut screen time (TV, phone) by at least 50% if not more.  If your symptoms of headache and nausea and confusion persist, follow-up with your primary care physician or Dr. Hulan Saas at [facility] who has a concussion clinic.  You can take Zofran as needed for nausea.  Wait around 20 minutes before you have anything to eat or drink to get this medicine time to work.  and follow up with primary care physician for recheck of ongoing symptoms but return to ER for emergent changing or worsening of symptoms such as severe headache that gets worse, altered mental status/behaving unusually, persistent vomiting, excessive drowsiness, numbness to the arms or legs, unsteady gait, or slurred speech.
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
## 2018-02-12 NOTE — ED Notes (Signed)
Pt refused to sign consent for treatment. Pt was educated on what consent for treatment means and pt continued to refuse. I advised to pt that medications can not be passed without this consent signed. Made Mina PA aware.
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
## 2018-02-12 NOTE — ED Notes (Signed)
Pt refused to update vital signs. Pt also refused to sign for discharge.  This Probation officer observed pt transfer from chair to wheelchair and wheelchair to car without assistance.

## 2018-02-12 NOTE — Progress Notes (Signed)
ED CM at [facility] covering for [facility] spoke with the ED PA. Walker order faxed to [facility] for delivery. Jermaine at [facility] notified via text. Venita Sheffield RN CCM

## 2018-02-13 ENCOUNTER — Ambulatory Visit: Payer: Self-pay | Admitting: *Deleted

## 2018-02-13 ENCOUNTER — Telehealth: Payer: Self-pay

## 2018-02-13 NOTE — Telephone Encounter (Signed)
Pt seen in ED on 02/12/18 and instructed to contact Dr Hulan Saas at [facility]; conference call initiated with Mateo Flow at [facility]; concussion clinic appointment 02/19/18 at [facility] per Mateo Flow; instructions given per the concussion clinic; the pt normally sees Dr Pricilla Holm, [facility]; will route to office for notification of this encounter.
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
## 2018-02-14 ENCOUNTER — Emergency Department (HOSPITAL_COMMUNITY): Payer: Medicare Other

## 2018-02-14 ENCOUNTER — Emergency Department (HOSPITAL_COMMUNITY)
Admission: EM | Admit: 2018-02-14 | Discharge: 2018-02-14 | Disposition: A | Discharge status: Home / Self Care | Payer: Medicare Other | Attending: Emergency Medicine | Admitting: Emergency Medicine

## 2018-02-14 ENCOUNTER — Encounter (HOSPITAL_COMMUNITY): Payer: Self-pay | Admitting: Emergency Medicine

## 2018-02-14 DIAGNOSIS — Y939 Activity, unspecified: Secondary | ICD-10-CM | POA: Diagnosis not present

## 2018-02-14 DIAGNOSIS — Z79899 Other long term (current) drug therapy: Secondary | ICD-10-CM | POA: Insufficient documentation

## 2018-02-14 DIAGNOSIS — S3992XA Unspecified injury of lower back, initial encounter: Secondary | ICD-10-CM | POA: Diagnosis not present

## 2018-02-14 DIAGNOSIS — Y929 Unspecified place or not applicable: Secondary | ICD-10-CM | POA: Diagnosis not present

## 2018-02-14 DIAGNOSIS — J45909 Unspecified asthma, uncomplicated: Secondary | ICD-10-CM | POA: Diagnosis not present

## 2018-02-14 DIAGNOSIS — S0990XA Unspecified injury of head, initial encounter: Secondary | ICD-10-CM | POA: Diagnosis not present

## 2018-02-14 DIAGNOSIS — Y999 Unspecified external cause status: Secondary | ICD-10-CM | POA: Diagnosis not present

## 2018-02-14 DIAGNOSIS — R51 Headache: Secondary | ICD-10-CM | POA: Diagnosis not present

## 2018-02-14 DIAGNOSIS — M545 Low back pain: Secondary | ICD-10-CM | POA: Diagnosis not present

## 2018-02-14 DIAGNOSIS — E119 Type 2 diabetes mellitus without complications: Secondary | ICD-10-CM | POA: Insufficient documentation

## 2018-02-14 LAB — POC URINE PREG, ED: Preg Test, Ur: NEGATIVE

## 2018-02-14 MED ORDER — LIDOCAINE 5 % EX PTCH
1.0000 | MEDICATED_PATCH | CUTANEOUS | Status: DC
  Administered 2018-02-14: 1 via TRANSDERMAL
  Filled 2018-02-14: qty 1

## 2018-02-14 MED ORDER — FLUTICASONE PROPIONATE 50 MCG/ACT NA SUSP: 1.0000 | Freq: Every day | NASAL | 0 refills | Status: DC

## 2018-02-14 MED ORDER — CAMPHOR-MENTHOL-METHYL SAL 1.2-5.7-6.3 % EX PTCH: 1.0000 | MEDICATED_PATCH | Freq: Every day | CUTANEOUS | 0 refills | Status: DC

## 2018-02-14 NOTE — ED Notes (Signed)
Bladder scanned pt, monitor read 158mL. RN notified.
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
## 2018-02-14 NOTE — ED Notes (Signed)
Patient verbalizes understanding of discharge instructions. Opportunity for questioning and answers were provided. 
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
## 2018-02-17 ENCOUNTER — Encounter (HOSPITAL_COMMUNITY): Payer: Self-pay | Admitting: Emergency Medicine

## 2018-02-17 ENCOUNTER — Emergency Department (HOSPITAL_COMMUNITY)
Admission: EM | Admit: 2018-02-17 | Discharge: 2018-02-17 | Disposition: A | Discharge status: Home / Self Care | Payer: Medicare Other | Attending: Emergency Medicine | Admitting: Emergency Medicine

## 2018-02-17 DIAGNOSIS — E119 Type 2 diabetes mellitus without complications: Secondary | ICD-10-CM | POA: Diagnosis not present

## 2018-02-17 DIAGNOSIS — M5416 Radiculopathy, lumbar region: Secondary | ICD-10-CM

## 2018-02-17 DIAGNOSIS — R531 Weakness: Secondary | ICD-10-CM | POA: Diagnosis present

## 2018-02-17 DIAGNOSIS — J45909 Unspecified asthma, uncomplicated: Secondary | ICD-10-CM | POA: Diagnosis not present

## 2018-02-17 DIAGNOSIS — E860 Dehydration: Secondary | ICD-10-CM | POA: Diagnosis not present

## 2018-02-17 DIAGNOSIS — S060X0D Concussion without loss of consciousness, subsequent encounter: Secondary | ICD-10-CM | POA: Diagnosis not present

## 2018-02-17 DIAGNOSIS — Z7984 Long term (current) use of oral hypoglycemic drugs: Secondary | ICD-10-CM | POA: Insufficient documentation

## 2018-02-17 DIAGNOSIS — E039 Hypothyroidism, unspecified: Secondary | ICD-10-CM | POA: Diagnosis not present

## 2018-02-17 DIAGNOSIS — R112 Nausea with vomiting, unspecified: Secondary | ICD-10-CM

## 2018-02-17 DIAGNOSIS — Z79899 Other long term (current) drug therapy: Secondary | ICD-10-CM | POA: Insufficient documentation

## 2018-02-17 LAB — CBC
HCT: 37.7 % (ref 36.0–46.0)
Hemoglobin: 11.3 g/dL — ABNORMAL LOW (ref 12.0–15.0)
MCH: 25.7 pg — ABNORMAL LOW (ref 26.0–34.0)
MCHC: 30 g/dL (ref 30.0–36.0)
MCV: 85.9 fL (ref 80.0–100.0)
Platelets: 252 10*3/uL (ref 150–400)
RBC: 4.39 MIL/uL (ref 3.87–5.11)
RDW: 12.7 % (ref 11.5–15.5)
WBC: 5.3 10*3/uL (ref 4.0–10.5)
nRBC: 0 % (ref 0.0–0.2)

## 2018-02-17 LAB — BASIC METABOLIC PANEL
Anion gap: 8 (ref 5–15)
BUN: 15 mg/dL (ref 6–20)
CO2: 25 mmol/L (ref 22–32)
Calcium: 8.8 mg/dL — ABNORMAL LOW (ref 8.9–10.3)
Chloride: 104 mmol/L (ref 98–111)
Creatinine, Ser: 0.58 mg/dL (ref 0.44–1.00)
GFR calc Af Amer: >60 mL/min (ref >60)
GFR calc non Af Amer: >60 mL/min (ref >60)
Glucose, Bld: 99 mg/dL (ref 70–99)
Potassium: 3.8 mmol/L (ref 3.5–5.1)
Sodium: 137 mmol/L (ref 135–145)

## 2018-02-17 LAB — I-STAT BETA HCG BLOOD, ED (MC, WL, AP ONLY): I-stat hCG, quantitative: <5 m[IU]/mL (ref <5)

## 2018-02-17 MED ORDER — PROMETHAZINE HCL 25 MG/ML IJ SOLN
25.0000 mg | Freq: Once | INTRAMUSCULAR | Status: AC
  Administered 2018-02-17: 25 mg via INTRAVENOUS
  Filled 2018-02-17: qty 1

## 2018-02-17 MED ORDER — PROMETHAZINE HCL 25 MG PO TABS: 25.0000 mg | ORAL_TABLET | Freq: Four times a day (QID) | ORAL | 0 refills | Status: DC | PRN

## 2018-02-17 MED ORDER — PREDNISONE 10 MG (21) PO TBPK: ORAL_TABLET | ORAL | 0 refills | Status: DC

## 2018-02-17 MED ORDER — OXYCODONE-ACETAMINOPHEN 5-325 MG PO TABS: 1.0000 | ORAL_TABLET | ORAL | 0 refills | Status: DC | PRN

## 2018-02-17 MED ORDER — SODIUM CHLORIDE 0.9 % IV BOLUS
1000.0000 mL | Freq: Once | INTRAVENOUS | Status: AC
  Administered 2018-02-17: 1000 mL via INTRAVENOUS

## 2018-02-17 MED ORDER — MORPHINE SULFATE (PF) 4 MG/ML IV SOLN
4.0000 mg | Freq: Once | INTRAVENOUS | Status: AC
  Administered 2018-02-17: 4 mg via INTRAVENOUS
  Filled 2018-02-17: qty 1

## 2018-02-17 MED ORDER — METHYLPREDNISOLONE SODIUM SUCC 125 MG IJ SOLR
125.0000 mg | Freq: Once | INTRAMUSCULAR | Status: AC
  Administered 2018-02-17: 125 mg via INTRAVENOUS
  Filled 2018-02-17: qty 2

## 2018-02-17 MED ORDER — SODIUM CHLORIDE 0.9 % IV SOLN: INTRAVENOUS | Status: DC

## 2018-02-17 NOTE — ED Notes (Signed)
E-signature not available, pt verbalized understanding of DC instructions and prescriptions 

## 2018-02-17 NOTE — ED Notes (Signed)
Pt had not problems ambulating in the hall.

## 2018-02-17 NOTE — ED Notes (Signed)
Attempted IV access x2, without success.  
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
## 2018-02-18 ENCOUNTER — Encounter: Payer: Self-pay | Admitting: Internal Medicine
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
## 2018-02-19 ENCOUNTER — Ambulatory Visit (INDEPENDENT_AMBULATORY_CARE_PROVIDER_SITE_OTHER): Payer: Medicare Other | Admitting: Family Medicine

## 2018-02-19 ENCOUNTER — Encounter: Payer: Self-pay | Admitting: Family Medicine

## 2018-02-19 VITALS — BP 102/78 | HR 69 | Ht 66.0 in | Wt 185.0 lb

## 2018-02-19 DIAGNOSIS — S0990XA Unspecified injury of head, initial encounter: Secondary | ICD-10-CM | POA: Diagnosis not present

## 2018-02-19 DIAGNOSIS — G7000 Myasthenia gravis without (acute) exacerbation: Secondary | ICD-10-CM | POA: Diagnosis not present

## 2018-02-19 NOTE — Patient Instructions (Signed)
Good to meet you  Fish oil 2 grams daily  Turmeric 500mg  daily  CoQ10 200mg  daily  Vitamin D 4000 IU daily  This will take time but I am optimistic you will improve.

## 2018-02-19 NOTE — Assessment & Plan Note (Signed)
Difficult to assess with patient and her comorbidities.  Patient symptom score is extremely high and likely artificially inflated.  Discussed with patient in great length.  At this moment we like to not cause too many other complications or drug interactions.  Patient will try conservative therapy at this moment.  We tried over-the-counter medications.  We discussed decreasing inflammation with patient's myasthenia gravis and that this can be complicated.  We want to avoid any type of flare after this trauma as well.  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

## 2018-02-20 MED ORDER — FERRALET 90 90-1 MG PO TABS: 1.0000 | ORAL_TABLET | Freq: Every day | ORAL | 1 refills | Status: DC

## 2018-02-21 ENCOUNTER — Encounter: Payer: Self-pay | Admitting: Internal Medicine

## 2018-02-21 ENCOUNTER — Ambulatory Visit: Payer: Medicare Other | Admitting: Internal Medicine

## 2018-02-21 ENCOUNTER — Ambulatory Visit (INDEPENDENT_AMBULATORY_CARE_PROVIDER_SITE_OTHER): Payer: Medicare Other | Admitting: Internal Medicine

## 2018-02-21 DIAGNOSIS — J4530 Mild persistent asthma, uncomplicated: Secondary | ICD-10-CM

## 2018-02-21 DIAGNOSIS — G7000 Myasthenia gravis without (acute) exacerbation: Secondary | ICD-10-CM

## 2018-02-21 DIAGNOSIS — S0990XA Unspecified injury of head, initial encounter: Secondary | ICD-10-CM

## 2018-02-21 DIAGNOSIS — M797 Fibromyalgia: Secondary | ICD-10-CM

## 2018-02-21 MED ORDER — OMEGA-3-ACID ETHYL ESTERS 1 G PO CAPS: 2.0000 g | ORAL_CAPSULE | Freq: Two times a day (BID) | ORAL | 0 refills | Status: DC

## 2018-02-21 MED ORDER — VITAMIN D-3 125 MCG (5000 UT) PO TABS: 1.0000 | ORAL_TABLET | Freq: Every day | ORAL | 0 refills | Status: DC

## 2018-02-21 NOTE — Assessment & Plan Note (Signed)
No flare today but suspect some shallow breathing due to muscular pain and encouraged her to take deep breathing.

## 2018-02-21 NOTE — Assessment & Plan Note (Signed)
Advised that I do not feel more imaging is needed at this time. Rx for fish oil and vitamin D as these were two supplements recommended. Advised that she try turmeric as this is useful to decrease inflammation.

## 2018-02-21 NOTE — Assessment & Plan Note (Signed)
She is still taking prednisone taper and mestinon. Do not see evidence of flare today although she is having subjective muscle weakness in her legs but walking is fairly normal.
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
## 2018-02-21 NOTE — Patient Instructions (Signed)
We have sent in the fish oil and vitamin D prescription to see if they are covered by insurance.   I would recommend to try turmeric 500 mg twice a day as this has a good chance to help some with pain.
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
## 2018-02-22 ENCOUNTER — Other Ambulatory Visit: Payer: Self-pay

## 2018-02-22 ENCOUNTER — Encounter (HOSPITAL_COMMUNITY): Payer: Self-pay

## 2018-02-22 ENCOUNTER — Emergency Department (HOSPITAL_COMMUNITY)
Admission: EM | Admit: 2018-02-22 | Discharge: 2018-02-22 | Disposition: A | Discharge status: Home / Self Care | Payer: Medicare Other | Attending: Emergency Medicine | Admitting: Emergency Medicine

## 2018-02-22 DIAGNOSIS — R51 Headache: Secondary | ICD-10-CM | POA: Diagnosis not present

## 2018-02-22 DIAGNOSIS — R1110 Vomiting, unspecified: Secondary | ICD-10-CM | POA: Diagnosis not present

## 2018-02-22 DIAGNOSIS — Z5321 Procedure and treatment not carried out due to patient leaving prior to being seen by health care provider: Secondary | ICD-10-CM | POA: Insufficient documentation

## 2018-02-22 NOTE — ED Notes (Signed)
Pt called for room x3 with no answer. 
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
## 2018-02-22 NOTE — ED Notes (Signed)
No answer for room x2 

## 2018-02-24 ENCOUNTER — Encounter (HOSPITAL_COMMUNITY): Payer: Self-pay | Admitting: Emergency Medicine

## 2018-02-24 ENCOUNTER — Other Ambulatory Visit: Payer: Self-pay

## 2018-02-24 ENCOUNTER — Emergency Department (HOSPITAL_COMMUNITY)
Admission: EM | Admit: 2018-02-24 | Discharge: 2018-02-24 | Disposition: A | Discharge status: Home / Self Care | Payer: Medicare Other | Attending: Emergency Medicine | Admitting: Emergency Medicine

## 2018-02-24 DIAGNOSIS — R51 Headache: Secondary | ICD-10-CM | POA: Diagnosis present

## 2018-02-24 DIAGNOSIS — E119 Type 2 diabetes mellitus without complications: Secondary | ICD-10-CM | POA: Diagnosis not present

## 2018-02-24 DIAGNOSIS — G44319 Acute post-traumatic headache, not intractable: Secondary | ICD-10-CM | POA: Diagnosis not present

## 2018-02-24 DIAGNOSIS — IMO0001 Reserved for inherently not codable concepts without codable children: Secondary | ICD-10-CM

## 2018-02-24 DIAGNOSIS — R200 Anesthesia of skin: Secondary | ICD-10-CM | POA: Diagnosis not present

## 2018-02-24 DIAGNOSIS — R202 Paresthesia of skin: Secondary | ICD-10-CM | POA: Diagnosis not present

## 2018-02-24 DIAGNOSIS — Z79899 Other long term (current) drug therapy: Secondary | ICD-10-CM | POA: Diagnosis not present

## 2018-02-24 DIAGNOSIS — Z794 Long term (current) use of insulin: Secondary | ICD-10-CM | POA: Diagnosis not present

## 2018-02-24 DIAGNOSIS — R112 Nausea with vomiting, unspecified: Secondary | ICD-10-CM | POA: Insufficient documentation

## 2018-02-24 DIAGNOSIS — R209 Unspecified disturbances of skin sensation: Secondary | ICD-10-CM

## 2018-02-24 DIAGNOSIS — F319 Bipolar disorder, unspecified: Secondary | ICD-10-CM | POA: Insufficient documentation

## 2018-02-24 DIAGNOSIS — M549 Dorsalgia, unspecified: Secondary | ICD-10-CM | POA: Diagnosis not present

## 2018-02-24 MED ORDER — MORPHINE SULFATE (PF) 4 MG/ML IV SOLN
4.0000 mg | Freq: Once | INTRAVENOUS | Status: AC
  Administered 2018-02-24: 4 mg via INTRAVENOUS
  Filled 2018-02-24: qty 1

## 2018-02-24 MED ORDER — METOCLOPRAMIDE HCL 5 MG/ML IJ SOLN
10.0000 mg | Freq: Once | INTRAMUSCULAR | Status: AC
  Administered 2018-02-24: 10 mg via INTRAVENOUS
  Filled 2018-02-24: qty 2

## 2018-02-24 MED ORDER — METHOCARBAMOL 500 MG PO TABS: 500.0000 mg | ORAL_TABLET | Freq: Two times a day (BID) | ORAL | 0 refills | Status: DC | PRN

## 2018-02-24 MED ORDER — IBUPROFEN 800 MG PO TABS: 800.0000 mg | ORAL_TABLET | Freq: Three times a day (TID) | ORAL | 0 refills | Status: DC | PRN

## 2018-02-24 MED ORDER — SODIUM CHLORIDE 0.9 % IV BOLUS
1000.0000 mL | Freq: Once | INTRAVENOUS | Status: AC
  Administered 2018-02-24: 1000 mL via INTRAVENOUS

## 2018-02-24 MED ORDER — KETOROLAC TROMETHAMINE 30 MG/ML IJ SOLN
30.0000 mg | Freq: Once | INTRAMUSCULAR | Status: AC
  Administered 2018-02-24: 30 mg via INTRAVENOUS
  Filled 2018-02-24: qty 1

## 2018-02-24 MED ORDER — PROMETHAZINE HCL 25 MG PO TABS: 25.0000 mg | ORAL_TABLET | Freq: Four times a day (QID) | ORAL | 0 refills | Status: DC | PRN
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
## 2018-02-26 ENCOUNTER — Telehealth: Payer: Self-pay

## 2018-02-26 NOTE — Telephone Encounter (Signed)
Spoke with patient. She would like medical records from those visit dates. Provided her with the phone number for medical records. Patient voiced understanding.

## 2018-02-26 NOTE — Telephone Encounter (Signed)
Copied from [place] [phone]. Topic: General - Other >> Feb 26, 2018  9:31 AM Judyann Munson wrote: Reason for CRM:  patient is calling to request a update on doctor note for her appt. 02-19-18 and 02-21-18. Please advise

## 2018-02-27 ENCOUNTER — Ambulatory Visit (INDEPENDENT_AMBULATORY_CARE_PROVIDER_SITE_OTHER): Payer: Medicare Other | Admitting: Internal Medicine

## 2018-02-27 ENCOUNTER — Emergency Department (HOSPITAL_COMMUNITY)
Admission: EM | Admit: 2018-02-27 | Discharge: 2018-02-27 | Disposition: A | Discharge status: Home / Self Care | Payer: Medicare Other | Attending: Emergency Medicine | Admitting: Emergency Medicine

## 2018-02-27 ENCOUNTER — Encounter (HOSPITAL_COMMUNITY): Payer: Self-pay

## 2018-02-27 ENCOUNTER — Encounter: Payer: Self-pay | Admitting: Internal Medicine

## 2018-02-27 ENCOUNTER — Other Ambulatory Visit: Payer: Self-pay

## 2018-02-27 VITALS — BP 110/72 | HR 87 | Temp 97.9°F | Ht 66.0 in | Wt 189.0 lb

## 2018-02-27 DIAGNOSIS — R51 Headache: Secondary | ICD-10-CM | POA: Diagnosis not present

## 2018-02-27 DIAGNOSIS — Z7984 Long term (current) use of oral hypoglycemic drugs: Secondary | ICD-10-CM | POA: Diagnosis not present

## 2018-02-27 DIAGNOSIS — E119 Type 2 diabetes mellitus without complications: Secondary | ICD-10-CM | POA: Diagnosis not present

## 2018-02-27 DIAGNOSIS — Z79899 Other long term (current) drug therapy: Secondary | ICD-10-CM | POA: Diagnosis not present

## 2018-02-27 DIAGNOSIS — J45909 Unspecified asthma, uncomplicated: Secondary | ICD-10-CM | POA: Diagnosis not present

## 2018-02-27 DIAGNOSIS — M549 Dorsalgia, unspecified: Secondary | ICD-10-CM | POA: Diagnosis not present

## 2018-02-27 DIAGNOSIS — S060X0D Concussion without loss of consciousness, subsequent encounter: Secondary | ICD-10-CM | POA: Insufficient documentation

## 2018-02-27 DIAGNOSIS — E039 Hypothyroidism, unspecified: Secondary | ICD-10-CM | POA: Insufficient documentation

## 2018-02-27 MED ORDER — PROCHLORPERAZINE EDISYLATE 10 MG/2ML IJ SOLN
10.0000 mg | Freq: Once | INTRAMUSCULAR | Status: AC
  Administered 2018-02-27: 10 mg via INTRAVENOUS
  Filled 2018-02-27: qty 2

## 2018-02-27 MED ORDER — DIPHENHYDRAMINE HCL 25 MG PO CAPS
25.0000 mg | ORAL_CAPSULE | Freq: Once | ORAL | Status: AC
  Administered 2018-02-27: 25 mg via ORAL
  Filled 2018-02-27: qty 1

## 2018-02-27 MED ORDER — PROMETHAZINE HCL 25 MG RE SUPP: 25.0000 mg | Freq: Four times a day (QID) | RECTAL | 0 refills | Status: DC | PRN

## 2018-02-27 MED ORDER — DEXAMETHASONE SODIUM PHOSPHATE 10 MG/ML IJ SOLN
10.0000 mg | Freq: Once | INTRAMUSCULAR | Status: AC
  Administered 2018-02-27: 10 mg via INTRAVENOUS
  Filled 2018-02-27: qty 1
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
## 2018-02-27 NOTE — Patient Instructions (Signed)
We will get you in with physical therapy and the back specialist.

## 2018-02-28 ENCOUNTER — Encounter: Payer: Self-pay | Admitting: Internal Medicine

## 2018-02-28 NOTE — Assessment & Plan Note (Signed)
Referral to PT and neurosurgery. She is taking oxycodone from pain management as well as tylenol and NSAIDs. Given her extensive medical regimen would not add more medications today. She is encouraged that her symptoms are gradually improving although she does not feel this way. We discussed MRI as she repeatedly asks about this and we discussed that this would not give [place] new information here and her symptoms could last another 4-6 weeks possibly and be normal. Also we talked about the need to lie perfectly still for multiple hours for MRI back and she is not able to do this now so an MRI would likely not be high quality.

## 2018-03-03 ENCOUNTER — Encounter (HOSPITAL_COMMUNITY): Payer: Self-pay | Admitting: *Deleted

## 2018-03-03 ENCOUNTER — Emergency Department (HOSPITAL_COMMUNITY)
Admission: EM | Admit: 2018-03-03 | Discharge: 2018-03-03 | Disposition: A | Discharge status: Home / Self Care | Payer: Medicare Other | Attending: Emergency Medicine | Admitting: Emergency Medicine

## 2018-03-03 ENCOUNTER — Emergency Department (HOSPITAL_COMMUNITY): Payer: Medicare Other

## 2018-03-03 DIAGNOSIS — E890 Postprocedural hypothyroidism: Secondary | ICD-10-CM | POA: Insufficient documentation

## 2018-03-03 DIAGNOSIS — S99921A Unspecified injury of right foot, initial encounter: Secondary | ICD-10-CM | POA: Diagnosis not present

## 2018-03-03 DIAGNOSIS — R110 Nausea: Secondary | ICD-10-CM | POA: Diagnosis not present

## 2018-03-03 DIAGNOSIS — Z79899 Other long term (current) drug therapy: Secondary | ICD-10-CM | POA: Diagnosis not present

## 2018-03-03 DIAGNOSIS — J45909 Unspecified asthma, uncomplicated: Secondary | ICD-10-CM | POA: Diagnosis not present

## 2018-03-03 DIAGNOSIS — E119 Type 2 diabetes mellitus without complications: Secondary | ICD-10-CM | POA: Diagnosis not present

## 2018-03-03 DIAGNOSIS — Z7984 Long term (current) use of oral hypoglycemic drugs: Secondary | ICD-10-CM | POA: Diagnosis not present

## 2018-03-03 DIAGNOSIS — R51 Headache: Secondary | ICD-10-CM | POA: Diagnosis not present

## 2018-03-03 DIAGNOSIS — M79674 Pain in right toe(s): Secondary | ICD-10-CM | POA: Diagnosis not present

## 2018-03-03 DIAGNOSIS — R519 Headache, unspecified: Secondary | ICD-10-CM

## 2018-03-03 MED ORDER — SODIUM CHLORIDE 0.9 % IV BOLUS
500.0000 mL | Freq: Once | INTRAVENOUS | Status: AC
  Administered 2018-03-03: 500 mL via INTRAVENOUS

## 2018-03-03 MED ORDER — METOCLOPRAMIDE HCL 5 MG/ML IJ SOLN
10.0000 mg | Freq: Once | INTRAMUSCULAR | Status: AC
  Administered 2018-03-03: 10 mg via INTRAVENOUS
  Filled 2018-03-03: qty 2

## 2018-03-03 MED ORDER — KETOROLAC TROMETHAMINE 15 MG/ML IJ SOLN
15.0000 mg | Freq: Once | INTRAMUSCULAR | Status: AC
  Administered 2018-03-03: 15 mg via INTRAVENOUS
  Filled 2018-03-03: qty 1
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
## 2018-03-03 NOTE — Discharge Instructions (Signed)
We saw you in the ER for headaches. It appears that you have had a headache for several days now. It is prudent that you follow-up with either your primary care doctor or neurologist for further evaluation.
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
## 2018-03-04 ENCOUNTER — Other Ambulatory Visit: Payer: Self-pay | Admitting: *Deleted

## 2018-03-04 ENCOUNTER — Other Ambulatory Visit: Payer: Self-pay | Admitting: Internal Medicine

## 2018-03-04 NOTE — Patient Outreach (Signed)
[facility]) Care Management  03/04/2018  Jade Mathis 1975-08-12 [id number]   Telephone Screen  Referral Date:  03/04/2018 Referral Source:  [facility] ED Census Reason for Referral:  6 or more ED visits in the past 6 months Insurance:  [employer] & Medicaid   Outreach Attempt:  Outreach attempt #1 to patient for telephone screening.  Patient answered.  RN Health Coach introduced self and requested HIPAA verification.  Patient hung up telephone line at this time.   Plan:  RN Health Coach will close case based on patient's refusal to complete screening and declining services.   [facility] [phone] [email or web address]
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
## 2018-03-05 ENCOUNTER — Ambulatory Visit (INDEPENDENT_AMBULATORY_CARE_PROVIDER_SITE_OTHER): Payer: Medicare Other | Admitting: Family Medicine

## 2018-03-05 ENCOUNTER — Encounter: Payer: Self-pay | Admitting: Family Medicine

## 2018-03-05 VITALS — BP 118/84 | HR 80 | Ht 66.0 in | Wt 188.0 lb

## 2018-03-05 DIAGNOSIS — R51 Headache: Secondary | ICD-10-CM | POA: Diagnosis not present

## 2018-03-05 DIAGNOSIS — M797 Fibromyalgia: Secondary | ICD-10-CM | POA: Diagnosis not present

## 2018-03-05 DIAGNOSIS — G7000 Myasthenia gravis without (acute) exacerbation: Secondary | ICD-10-CM

## 2018-03-05 DIAGNOSIS — R519 Headache, unspecified: Secondary | ICD-10-CM

## 2018-03-05 DIAGNOSIS — S0990XA Unspecified injury of head, initial encounter: Secondary | ICD-10-CM | POA: Diagnosis not present

## 2018-03-05 MED ORDER — DOXYCYCLINE HYCLATE 100 MG PO TABS: 100.0000 mg | ORAL_TABLET | Freq: Two times a day (BID) | ORAL | 0 refills | Status: AC

## 2018-03-05 MED ORDER — FLUCONAZOLE 200 MG PO TABS: 200.0000 mg | ORAL_TABLET | Freq: Every day | ORAL | 0 refills | Status: AC

## 2018-03-05 NOTE — Patient Instructions (Addendum)
Good to see you  We will get MRi of your head Call [phone] to schedule.  Doxycycline 2 times a day for 10 days  Diflucan take 1 pill 3 days on the antibiotic and then again 7-10 days after  See me again in 2-3 weeks
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
## 2018-03-06 ENCOUNTER — Emergency Department (HOSPITAL_COMMUNITY)
Admission: EM | Admit: 2018-03-06 | Discharge: 2018-03-06 | Disposition: A | Discharge status: Home / Self Care | Payer: Medicare Other | Attending: Emergency Medicine | Admitting: Emergency Medicine

## 2018-03-06 ENCOUNTER — Encounter (HOSPITAL_COMMUNITY): Payer: Self-pay | Admitting: Emergency Medicine

## 2018-03-06 DIAGNOSIS — I10 Essential (primary) hypertension: Secondary | ICD-10-CM | POA: Insufficient documentation

## 2018-03-06 DIAGNOSIS — E119 Type 2 diabetes mellitus without complications: Secondary | ICD-10-CM | POA: Insufficient documentation

## 2018-03-06 DIAGNOSIS — Z794 Long term (current) use of insulin: Secondary | ICD-10-CM | POA: Diagnosis not present

## 2018-03-06 DIAGNOSIS — R51 Headache: Secondary | ICD-10-CM | POA: Diagnosis present

## 2018-03-06 DIAGNOSIS — G44309 Post-traumatic headache, unspecified, not intractable: Secondary | ICD-10-CM | POA: Diagnosis not present

## 2018-03-06 DIAGNOSIS — J45909 Unspecified asthma, uncomplicated: Secondary | ICD-10-CM | POA: Insufficient documentation

## 2018-03-06 MED ORDER — PROCHLORPERAZINE MALEATE 10 MG PO TABS: 10.0000 mg | ORAL_TABLET | Freq: Two times a day (BID) | ORAL | 0 refills | Status: DC | PRN

## 2018-03-06 MED ORDER — HALOPERIDOL LACTATE 5 MG/ML IJ SOLN
2.0000 mg | Freq: Once | INTRAMUSCULAR | Status: AC
  Administered 2018-03-06: 2 mg via INTRAVENOUS
  Filled 2018-03-06: qty 1

## 2018-03-06 MED ORDER — SODIUM CHLORIDE 0.9 % IV BOLUS
1000.0000 mL | Freq: Once | INTRAVENOUS | Status: AC
  Administered 2018-03-06: 1000 mL via INTRAVENOUS

## 2018-03-06 MED ORDER — KETOROLAC TROMETHAMINE 15 MG/ML IJ SOLN
15.0000 mg | Freq: Once | INTRAMUSCULAR | Status: AC
  Administered 2018-03-06: 15 mg via INTRAVENOUS
  Filled 2018-03-06: qty 1

## 2018-03-06 NOTE — ED Notes (Signed)
Patient is resting comfortably. 

## 2018-03-06 NOTE — ED Notes (Signed)
Pt requesting to leave now- cab waiting for her.
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
## 2018-03-06 NOTE — Discharge Instructions (Addendum)
You were evaluated in the Emergency Department and after careful evaluation, we did not find any emergent condition requiring admission or further testing in the hospital.  Your symptoms today seem to be due to postconcussion syndrome.  Please continue to try to follow-up with the neurology doctors.  You can use the medication provided as needed for nausea or headache.  Please return to the Emergency Department if you experience any worsening of your condition.  We encourage you to follow up with a primary care provider.  Thank you for allowing [place] to be a part of your care.
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
## 2018-03-08 DIAGNOSIS — E119 Type 2 diabetes mellitus without complications: Secondary | ICD-10-CM | POA: Diagnosis not present

## 2018-03-08 DIAGNOSIS — M5412 Radiculopathy, cervical region: Secondary | ICD-10-CM | POA: Diagnosis not present

## 2018-03-08 DIAGNOSIS — Z79899 Other long term (current) drug therapy: Secondary | ICD-10-CM | POA: Diagnosis not present

## 2018-03-08 DIAGNOSIS — M5431 Sciatica, right side: Secondary | ICD-10-CM | POA: Diagnosis not present

## 2018-03-11 ENCOUNTER — Other Ambulatory Visit: Payer: Self-pay

## 2018-03-13 ENCOUNTER — Emergency Department (HOSPITAL_COMMUNITY)
Admission: EM | Admit: 2018-03-13 | Discharge: 2018-03-13 | Disposition: A | Discharge status: Home / Self Care | Payer: Medicare Other | Attending: Emergency Medicine | Admitting: Emergency Medicine

## 2018-03-13 ENCOUNTER — Encounter: Payer: Self-pay | Admitting: Emergency Medicine

## 2018-03-13 DIAGNOSIS — Z79899 Other long term (current) drug therapy: Secondary | ICD-10-CM | POA: Diagnosis not present

## 2018-03-13 DIAGNOSIS — J45909 Unspecified asthma, uncomplicated: Secondary | ICD-10-CM | POA: Diagnosis not present

## 2018-03-13 DIAGNOSIS — E039 Hypothyroidism, unspecified: Secondary | ICD-10-CM | POA: Insufficient documentation

## 2018-03-13 DIAGNOSIS — F0781 Postconcussional syndrome: Secondary | ICD-10-CM | POA: Diagnosis not present

## 2018-03-13 DIAGNOSIS — E119 Type 2 diabetes mellitus without complications: Secondary | ICD-10-CM | POA: Diagnosis not present

## 2018-03-13 DIAGNOSIS — R51 Headache: Secondary | ICD-10-CM | POA: Diagnosis not present

## 2018-03-13 MED ORDER — DIPHENHYDRAMINE HCL 50 MG/ML IJ SOLN
25.0000 mg | Freq: Once | INTRAMUSCULAR | Status: AC
  Administered 2018-03-13: 25 mg via INTRAVENOUS
  Filled 2018-03-13: qty 1

## 2018-03-13 MED ORDER — METOCLOPRAMIDE HCL 5 MG/ML IJ SOLN
10.0000 mg | Freq: Once | INTRAMUSCULAR | Status: AC
  Administered 2018-03-13: 10 mg via INTRAVENOUS
  Filled 2018-03-13: qty 2

## 2018-03-13 MED ORDER — KETOROLAC TROMETHAMINE 30 MG/ML IJ SOLN
30.0000 mg | Freq: Once | INTRAMUSCULAR | Status: AC
  Administered 2018-03-13: 30 mg via INTRAVENOUS
  Filled 2018-03-13: qty 1

## 2018-03-13 MED ORDER — SODIUM CHLORIDE 0.9 % IV BOLUS
1000.0000 mL | Freq: Once | INTRAVENOUS | Status: AC
  Administered 2018-03-13: 1000 mL via INTRAVENOUS

## 2018-03-13 NOTE — Discharge Instructions (Addendum)
1. Medications: continue to alternate tylenol and ibuprofen for pain, phenergan for nausea, usual home medications 2. Treatment: rest, drink plenty of fluids,  3. Follow Up: Please followup with neurology as soon as possible for discussion of your diagnoses and further evaluation after today's visit; if you do not have a primary care doctor use the resource guide provided to find one; Please return to the ER for new or worsening symptoms including slurred speech, fevers, persistent vomiting, vision changes or other concerns.
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
## 2018-03-14 ENCOUNTER — Ambulatory Visit: Payer: Self-pay | Admitting: Endocrinology

## 2018-03-15 ENCOUNTER — Other Ambulatory Visit: Payer: Self-pay | Admitting: Endocrinology

## 2018-03-17 ENCOUNTER — Ambulatory Visit
Admission: RE | Admit: 2018-03-17 | Discharge: 2018-03-17 | Disposition: A | Discharge status: Home / Self Care | Payer: Medicare Other | Source: Ambulatory Visit | Attending: Family Medicine | Admitting: Family Medicine

## 2018-03-17 ENCOUNTER — Other Ambulatory Visit: Payer: Self-pay | Admitting: Internal Medicine

## 2018-03-17 DIAGNOSIS — H538 Other visual disturbances: Secondary | ICD-10-CM | POA: Diagnosis not present

## 2018-03-17 DIAGNOSIS — R51 Headache: Principal | ICD-10-CM

## 2018-03-17 DIAGNOSIS — R519 Headache, unspecified: Secondary | ICD-10-CM

## 2018-03-17 MED ORDER — GADOBENATE DIMEGLUMINE 529 MG/ML IV SOLN
15.0000 mL | Freq: Once | INTRAVENOUS | Status: AC | PRN
  Administered 2018-03-17: 15 mL via INTRAVENOUS

## 2018-03-18 ENCOUNTER — Telehealth: Payer: Self-pay | Admitting: *Deleted

## 2018-03-18 DIAGNOSIS — Q059 Spina bifida, unspecified: Secondary | ICD-10-CM

## 2018-03-18 NOTE — Telephone Encounter (Signed)
Referral entered & sent to [facility] Neurosurgery.

## 2018-03-18 NOTE — Telephone Encounter (Signed)
-----   Message from Lyndal Pulley, DO sent at 03/18/2018  7:50 AM EST ----- Need referral to nuerosurgery fairly urgent

## 2018-03-19 ENCOUNTER — Other Ambulatory Visit: Payer: Medicare Other

## 2018-03-21 ENCOUNTER — Telehealth: Payer: Self-pay | Admitting: Endocrinology

## 2018-03-21 ENCOUNTER — Other Ambulatory Visit: Payer: Self-pay

## 2018-03-21 ENCOUNTER — Telehealth: Payer: Self-pay | Admitting: Internal Medicine

## 2018-03-21 ENCOUNTER — Ambulatory Visit: Payer: Self-pay | Admitting: Internal Medicine

## 2018-03-21 MED ORDER — ACCU-CHEK AVIVA PLUS W/DEVICE KIT: PACK | 1 refills | Status: DC
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
## 2018-03-21 NOTE — Telephone Encounter (Signed)
Rx has been sent  
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
## 2018-03-21 NOTE — Telephone Encounter (Signed)
TC to Concussion clinic #. Left VM in regards to today's triage with patient.

## 2018-03-21 NOTE — Telephone Encounter (Signed)
Called patient to let her know again about the MRI.  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  Patient already has a referral to neurosurgery for further evaluation of this.  Patient has seen him before for her low back pain.  Patient wants to keep her follow-up appointment for Monday with me even though it may not change medical management.  The only thing we can consider doing is treating her for the potential for intracranial hypertension.

## 2018-03-22 ENCOUNTER — Ambulatory Visit: Payer: Self-pay | Admitting: Endocrinology

## 2018-03-22 ENCOUNTER — Other Ambulatory Visit: Payer: Self-pay | Admitting: Endocrinology

## 2018-03-25 ENCOUNTER — Encounter: Payer: Self-pay | Admitting: Family Medicine

## 2018-03-25 ENCOUNTER — Ambulatory Visit: Payer: Medicare Other | Attending: Internal Medicine | Admitting: Physical Therapy

## 2018-03-25 ENCOUNTER — Ambulatory Visit: Payer: Self-pay | Admitting: Physical Therapy

## 2018-03-25 ENCOUNTER — Ambulatory Visit (INDEPENDENT_AMBULATORY_CARE_PROVIDER_SITE_OTHER): Payer: Medicare Other | Admitting: Family Medicine

## 2018-03-25 ENCOUNTER — Encounter: Payer: Self-pay | Admitting: Physical Therapy

## 2018-03-25 VITALS — BP 110/84 | HR 81 | Ht 66.0 in | Wt 194.0 lb

## 2018-03-25 DIAGNOSIS — Q059 Spina bifida, unspecified: Secondary | ICD-10-CM | POA: Insufficient documentation

## 2018-03-25 DIAGNOSIS — M5441 Lumbago with sciatica, right side: Secondary | ICD-10-CM | POA: Insufficient documentation

## 2018-03-25 DIAGNOSIS — G4730 Sleep apnea, unspecified: Secondary | ICD-10-CM | POA: Diagnosis not present

## 2018-03-25 DIAGNOSIS — R42 Dizziness and giddiness: Secondary | ICD-10-CM | POA: Diagnosis not present

## 2018-03-25 DIAGNOSIS — M542 Cervicalgia: Secondary | ICD-10-CM | POA: Diagnosis not present

## 2018-03-25 DIAGNOSIS — M5442 Lumbago with sciatica, left side: Secondary | ICD-10-CM | POA: Diagnosis not present

## 2018-03-25 MED ORDER — DOXYCYCLINE HYCLATE 100 MG PO TABS: 100.0000 mg | ORAL_TABLET | Freq: Two times a day (BID) | ORAL | 0 refills | Status: AC
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
## 2018-03-25 NOTE — Assessment & Plan Note (Signed)
Meningeal scale abutting the frontal lobe which could be consistent with some of patient's symptoms and headaches.  Discussed with patient as well as family at great length.  Patient does have a chronic sinusitis and will be treated for that with antibiotics.  I do not see any signs that is consistent with any type of posttraumatic trauma to the brain but I do feel that the meningocele should have further evaluation by neurosurgery.  Patient in the interim will be treated for the chronic sinusitis and continue the vitamin supplementations.  Depending on evaluation by neurosurgery will discuss further.  Spent  25 minutes with patient face-to-face and had greater than 50% of counseling including as described above in assessment and plan.
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
## 2018-03-25 NOTE — Patient Instructions (Signed)
Good to see you  Lets see what the antibiotic does and what neurosurgery says.  We will get a sleep study as well  Will restart work on Thursday if everything is ok  See me again in 3 weeks

## 2018-03-25 NOTE — Patient Instructions (Signed)
Access Code: [email or web address]  URL: [email or web address]  Date: 03/25/2018  Prepared by: Elsie Ra   Exercises  Seated Cervical Sidebending Stretch - 3 sets - 30 hold - 2x daily - 6x weekly  Seated Cervical Retraction - 10 reps - 3 sets - 2x daily - 6x weekly  Cervical Extension AROM with Strap - 10 reps - 1-2 sets - 5 hold - 2x daily - 6x weekly  Seated Assisted Cervical Rotation with Towel - 10 reps - 1-2 sets - 5 hold - 2x daily - 6x weekly  Seated Scapular Retraction - 10 reps - 2-3 sets - 5 sec hold - 2x daily - 6x weekly  Supine Lower Trunk Rotation - 10 reps - 1-2 sets - 5 hold - 2x daily - 6x weekly

## 2018-03-26 ENCOUNTER — Encounter: Payer: Self-pay | Admitting: *Deleted

## 2018-03-26 ENCOUNTER — Encounter: Payer: Self-pay | Admitting: Family Medicine

## 2018-03-26 ENCOUNTER — Encounter: Payer: Self-pay | Admitting: Internal Medicine

## 2018-03-26 DIAGNOSIS — Q059 Spina bifida, unspecified: Secondary | ICD-10-CM | POA: Diagnosis not present

## 2018-03-27 ENCOUNTER — Ambulatory Visit (INDEPENDENT_AMBULATORY_CARE_PROVIDER_SITE_OTHER): Payer: Medicare Other | Admitting: Internal Medicine

## 2018-03-27 ENCOUNTER — Encounter: Payer: Self-pay | Admitting: *Deleted

## 2018-03-27 ENCOUNTER — Encounter (HOSPITAL_COMMUNITY): Payer: Self-pay | Admitting: *Deleted

## 2018-03-27 ENCOUNTER — Encounter: Payer: Self-pay | Admitting: Internal Medicine

## 2018-03-27 ENCOUNTER — Telehealth: Payer: Self-pay | Admitting: Internal Medicine

## 2018-03-27 ENCOUNTER — Ambulatory Visit (INDEPENDENT_AMBULATORY_CARE_PROVIDER_SITE_OTHER)
Admission: RE | Admit: 2018-03-27 | Discharge: 2018-03-27 | Disposition: A | Discharge status: Home / Self Care | Payer: Medicare Other | Source: Ambulatory Visit | Attending: Internal Medicine | Admitting: Internal Medicine

## 2018-03-27 ENCOUNTER — Emergency Department (HOSPITAL_COMMUNITY)
Admission: EM | Admit: 2018-03-27 | Discharge: 2018-03-27 | Disposition: A | Discharge status: Home / Self Care | Payer: Medicare Other | Attending: Emergency Medicine | Admitting: Emergency Medicine

## 2018-03-27 ENCOUNTER — Encounter: Payer: Self-pay | Admitting: Family Medicine

## 2018-03-27 VITALS — BP 100/70 | HR 74 | Temp 98.6°F | Ht 66.0 in | Wt 192.0 lb

## 2018-03-27 DIAGNOSIS — R112 Nausea with vomiting, unspecified: Secondary | ICD-10-CM | POA: Diagnosis not present

## 2018-03-27 DIAGNOSIS — R51 Headache: Secondary | ICD-10-CM | POA: Diagnosis not present

## 2018-03-27 DIAGNOSIS — Q059 Spina bifida, unspecified: Secondary | ICD-10-CM | POA: Diagnosis not present

## 2018-03-27 DIAGNOSIS — R195 Other fecal abnormalities: Secondary | ICD-10-CM | POA: Diagnosis not present

## 2018-03-27 DIAGNOSIS — G43909 Migraine, unspecified, not intractable, without status migrainosus: Secondary | ICD-10-CM | POA: Diagnosis not present

## 2018-03-27 DIAGNOSIS — G8929 Other chronic pain: Secondary | ICD-10-CM | POA: Diagnosis not present

## 2018-03-27 DIAGNOSIS — J45909 Unspecified asthma, uncomplicated: Secondary | ICD-10-CM | POA: Diagnosis not present

## 2018-03-27 DIAGNOSIS — K5903 Drug induced constipation: Secondary | ICD-10-CM | POA: Diagnosis not present

## 2018-03-27 DIAGNOSIS — E119 Type 2 diabetes mellitus without complications: Secondary | ICD-10-CM | POA: Insufficient documentation

## 2018-03-27 DIAGNOSIS — Z79899 Other long term (current) drug therapy: Secondary | ICD-10-CM | POA: Insufficient documentation

## 2018-03-27 DIAGNOSIS — E039 Hypothyroidism, unspecified: Secondary | ICD-10-CM | POA: Insufficient documentation

## 2018-03-27 MED ORDER — METOCLOPRAMIDE HCL 10 MG PO TABS
5.0000 mg | ORAL_TABLET | Freq: Once | ORAL | Status: AC
  Administered 2018-03-27: 5 mg via ORAL
  Filled 2018-03-27: qty 1

## 2018-03-27 MED ORDER — KETOROLAC TROMETHAMINE 30 MG/ML IJ SOLN
30.0000 mg | Freq: Once | INTRAMUSCULAR | Status: AC
  Administered 2018-03-27: 30 mg via INTRAMUSCULAR
  Filled 2018-03-27: qty 1

## 2018-03-27 MED ORDER — MAGNESIUM CITRATE PO SOLN: 1.0000 | Freq: Once | ORAL | 2 refills | Status: AC

## 2018-03-27 MED ORDER — LINACLOTIDE 290 MCG PO CAPS: 290.0000 ug | ORAL_CAPSULE | Freq: Every day | ORAL | 6 refills | Status: DC

## 2018-03-27 NOTE — Assessment & Plan Note (Signed)
Unclear etiology. There is some herniation of meninges possible on MRI and awaiting neurosurgery visit. She is having constipation with poor BS on exam which could be related. Checking x-ray abdomen to rule out obstruction.

## 2018-03-27 NOTE — Assessment & Plan Note (Signed)
Awaiting neurosurgery referral and review of records indicate MRI head done 2001 without this finding.

## 2018-03-27 NOTE — ED Triage Notes (Signed)
Pt in c/o headache since last night, has been having chronic headaches since a head injury in November, reports n/v with this also photophobia, no distress noted

## 2018-03-27 NOTE — Discharge Instructions (Addendum)
Please read attached information. If you experience any new or worsening signs or symptoms please return to the emergency room for evaluation. Please follow-up with your primary care provider or specialist as discussed.  °

## 2018-03-27 NOTE — Assessment & Plan Note (Signed)
Abdominal x-ray ordered to rule out obstruction as patient is poor historian. Rx for magnesium citrate and linzess to start. Likely caused in large part by high dose opioids.

## 2018-03-27 NOTE — Telephone Encounter (Signed)
[facility] called and stated the work return form that was being faxed is not going. & To please fax to [phone].   Are you the one trying to fax something?

## 2018-03-27 NOTE — Patient Instructions (Signed)
We are doing an x-ray of the stomach today to check for bowel obstruction.   If the bowels are obstructed this can be very serious and we may need to send you to the hospital.   If there is just a lot of constipation in the bowels we will have you drink the entire bottle of magnesium citrate that we have sent to pharmacy.   We have also sent in linzess to start taking daily to help with constipation long term.
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
## 2018-03-27 NOTE — ED Notes (Signed)
Patient verbalizes understanding of discharge instructions. Opportunity for questioning and answers were provided. Armband removed by staff, pt discharged from ED.  
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
## 2018-03-28 NOTE — Telephone Encounter (Signed)
noted 

## 2018-03-28 NOTE — Telephone Encounter (Signed)
This was with Dr.Malacara, We found out later.

## 2018-03-28 NOTE — Telephone Encounter (Signed)
Did patient give any forms that we had to fax at her visit? I don't recall her giving me anything.

## 2018-03-28 NOTE — Telephone Encounter (Signed)
This patient does not work and I believe this message is started on wrong patient.

## 2018-03-29 ENCOUNTER — Ambulatory Visit: Payer: Medicare Other

## 2018-03-29 ENCOUNTER — Emergency Department (HOSPITAL_COMMUNITY)
Admission: EM | Admit: 2018-03-29 | Discharge: 2018-03-29 | Disposition: A | Discharge status: Home / Self Care | Payer: Medicare Other | Attending: Emergency Medicine | Admitting: Emergency Medicine

## 2018-03-29 ENCOUNTER — Encounter (HOSPITAL_COMMUNITY): Payer: Self-pay | Admitting: Emergency Medicine

## 2018-03-29 DIAGNOSIS — Z79899 Other long term (current) drug therapy: Secondary | ICD-10-CM | POA: Insufficient documentation

## 2018-03-29 DIAGNOSIS — R112 Nausea with vomiting, unspecified: Secondary | ICD-10-CM | POA: Insufficient documentation

## 2018-03-29 DIAGNOSIS — F0781 Postconcussional syndrome: Secondary | ICD-10-CM | POA: Diagnosis not present

## 2018-03-29 DIAGNOSIS — Z7984 Long term (current) use of oral hypoglycemic drugs: Secondary | ICD-10-CM | POA: Diagnosis not present

## 2018-03-29 DIAGNOSIS — R42 Dizziness and giddiness: Secondary | ICD-10-CM

## 2018-03-29 DIAGNOSIS — R51 Headache: Secondary | ICD-10-CM | POA: Insufficient documentation

## 2018-03-29 DIAGNOSIS — E119 Type 2 diabetes mellitus without complications: Secondary | ICD-10-CM | POA: Diagnosis not present

## 2018-03-29 DIAGNOSIS — E039 Hypothyroidism, unspecified: Secondary | ICD-10-CM | POA: Insufficient documentation

## 2018-03-29 DIAGNOSIS — J45909 Unspecified asthma, uncomplicated: Secondary | ICD-10-CM | POA: Diagnosis not present

## 2018-03-29 DIAGNOSIS — M5441 Lumbago with sciatica, right side: Secondary | ICD-10-CM

## 2018-03-29 DIAGNOSIS — M5442 Lumbago with sciatica, left side: Principal | ICD-10-CM

## 2018-03-29 DIAGNOSIS — M542 Cervicalgia: Secondary | ICD-10-CM

## 2018-03-29 MED ORDER — SODIUM CHLORIDE 0.9 % IV SOLN: INTRAVENOUS | Status: DC

## 2018-03-29 MED ORDER — ONDANSETRON HCL 4 MG/2ML IJ SOLN
4.0000 mg | Freq: Once | INTRAMUSCULAR | Status: AC
  Administered 2018-03-29: 4 mg via INTRAVENOUS
  Filled 2018-03-29: qty 2

## 2018-03-29 MED ORDER — SODIUM CHLORIDE 0.9 % IV BOLUS
1000.0000 mL | Freq: Once | INTRAVENOUS | Status: AC
  Administered 2018-03-29: 1000 mL via INTRAVENOUS

## 2018-03-29 MED ORDER — KETOROLAC TROMETHAMINE 15 MG/ML IJ SOLN
30.0000 mg | Freq: Once | INTRAMUSCULAR | Status: AC
  Administered 2018-03-29: 30 mg via INTRAVENOUS
  Filled 2018-03-29: qty 2

## 2018-03-29 NOTE — ED Notes (Signed)
D/c reviewed with patient 
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
## 2018-03-29 NOTE — ED Triage Notes (Signed)
Patient reports having headaches, and has been throwing since this morning- Vomited 3 times.
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
## 2018-04-01 ENCOUNTER — Emergency Department (HOSPITAL_COMMUNITY)
Admission: EM | Admit: 2018-04-01 | Discharge: 2018-04-01 | Disposition: A | Discharge status: Home / Self Care | Payer: Medicare Other | Attending: Emergency Medicine | Admitting: Emergency Medicine

## 2018-04-01 ENCOUNTER — Encounter: Payer: Self-pay | Admitting: *Deleted

## 2018-04-01 ENCOUNTER — Other Ambulatory Visit: Payer: Self-pay

## 2018-04-01 ENCOUNTER — Encounter: Payer: Self-pay | Admitting: Family Medicine

## 2018-04-01 ENCOUNTER — Encounter (HOSPITAL_COMMUNITY): Payer: Self-pay | Admitting: Emergency Medicine

## 2018-04-01 ENCOUNTER — Ambulatory Visit (INDEPENDENT_AMBULATORY_CARE_PROVIDER_SITE_OTHER): Payer: Medicare Other | Admitting: Family Medicine

## 2018-04-01 DIAGNOSIS — J45909 Unspecified asthma, uncomplicated: Secondary | ICD-10-CM | POA: Insufficient documentation

## 2018-04-01 DIAGNOSIS — Q059 Spina bifida, unspecified: Secondary | ICD-10-CM

## 2018-04-01 DIAGNOSIS — G43909 Migraine, unspecified, not intractable, without status migrainosus: Secondary | ICD-10-CM | POA: Insufficient documentation

## 2018-04-01 DIAGNOSIS — I10 Essential (primary) hypertension: Secondary | ICD-10-CM | POA: Diagnosis not present

## 2018-04-01 DIAGNOSIS — G932 Benign intracranial hypertension: Secondary | ICD-10-CM | POA: Insufficient documentation

## 2018-04-01 DIAGNOSIS — E039 Hypothyroidism, unspecified: Secondary | ICD-10-CM | POA: Insufficient documentation

## 2018-04-01 DIAGNOSIS — E119 Type 2 diabetes mellitus without complications: Secondary | ICD-10-CM | POA: Diagnosis not present

## 2018-04-01 DIAGNOSIS — R51 Headache: Secondary | ICD-10-CM | POA: Diagnosis present

## 2018-04-01 DIAGNOSIS — I951 Orthostatic hypotension: Secondary | ICD-10-CM | POA: Diagnosis not present

## 2018-04-01 DIAGNOSIS — G43809 Other migraine, not intractable, without status migrainosus: Secondary | ICD-10-CM

## 2018-04-01 DIAGNOSIS — Z794 Long term (current) use of insulin: Secondary | ICD-10-CM | POA: Diagnosis not present

## 2018-04-01 MED ORDER — ACETAZOLAMIDE 125 MG PO TABS: 62.5000 mg | ORAL_TABLET | Freq: Two times a day (BID) | ORAL | 1 refills | Status: DC

## 2018-04-01 MED ORDER — METOCLOPRAMIDE HCL 10 MG PO TABS
5.0000 mg | ORAL_TABLET | Freq: Once | ORAL | Status: AC
  Administered 2018-04-01: 5 mg via ORAL
  Filled 2018-04-01: qty 1

## 2018-04-01 MED ORDER — DIPHENHYDRAMINE HCL 25 MG PO CAPS
25.0000 mg | ORAL_CAPSULE | Freq: Once | ORAL | Status: AC
  Administered 2018-04-01: 25 mg via ORAL
  Filled 2018-04-01: qty 1

## 2018-04-01 MED ORDER — KETOROLAC TROMETHAMINE 30 MG/ML IJ SOLN
30.0000 mg | Freq: Once | INTRAMUSCULAR | Status: AC
  Administered 2018-04-01: 30 mg via INTRAMUSCULAR
  Filled 2018-04-01: qty 1

## 2018-04-01 NOTE — ED Notes (Signed)
Patient verbalizes understanding of discharge instructions. Opportunity for questioning and answers were provided. Armband removed by staff, pt discharged from ED.  

## 2018-04-01 NOTE — Assessment & Plan Note (Signed)
Saw nuerosurgery they do not feel it is contributing.

## 2018-04-01 NOTE — Discharge Instructions (Signed)
Return to ED for worsening symptoms, head injuries or falls, numbness in arms or legs, blurry vision.

## 2018-04-01 NOTE — Patient Instructions (Addendum)
Good to see you  We will slowly get you back to work  2 days this week with no restrictin  Then 3 days the following week Then 4 days  Start diamox 1/2 tab 2 times a day to see if it helps with headaches See me again in 3 weeks

## 2018-04-01 NOTE — Assessment & Plan Note (Signed)
Patient has history of possible intracranial hypertension.  With patient's myasthenia gravis, hypothyroidism, meningocele multiple reasons for patient to have headaches.  Do not believe there is secondary to significant amount of postconcussive at this point.  Patient will start increasing her duties at work.  We will start part-time and increase over the course of next several weeks.  Patient will follow-up again in 3 weeks for further evaluation and treatment.  Started Diamox today to
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
## 2018-04-01 NOTE — Assessment & Plan Note (Signed)
Due to history of orthostatic hypotension started on a very low-dose of the Diamox to help with some of the headaches.  If worsening symptoms patient is to discontinue it and understands this.
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
## 2018-04-03 ENCOUNTER — Ambulatory Visit: Payer: Medicare Other | Admitting: Physical Therapy

## 2018-04-03 DIAGNOSIS — M5441 Lumbago with sciatica, right side: Secondary | ICD-10-CM

## 2018-04-03 DIAGNOSIS — R42 Dizziness and giddiness: Secondary | ICD-10-CM

## 2018-04-03 DIAGNOSIS — M5442 Lumbago with sciatica, left side: Secondary | ICD-10-CM | POA: Diagnosis not present

## 2018-04-03 DIAGNOSIS — M542 Cervicalgia: Secondary | ICD-10-CM | POA: Diagnosis not present
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
## 2018-04-04 ENCOUNTER — Encounter

## 2018-04-04 ENCOUNTER — Ambulatory Visit: Payer: Medicare Other | Admitting: Physical Therapy

## 2018-04-04 ENCOUNTER — Encounter: Payer: Self-pay | Admitting: Physical Therapy

## 2018-04-04 DIAGNOSIS — M5441 Lumbago with sciatica, right side: Secondary | ICD-10-CM | POA: Diagnosis not present

## 2018-04-04 DIAGNOSIS — M542 Cervicalgia: Secondary | ICD-10-CM | POA: Diagnosis not present

## 2018-04-04 DIAGNOSIS — R42 Dizziness and giddiness: Secondary | ICD-10-CM

## 2018-04-04 DIAGNOSIS — M5442 Lumbago with sciatica, left side: Secondary | ICD-10-CM | POA: Diagnosis not present
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
## 2018-04-05 ENCOUNTER — Other Ambulatory Visit (INDEPENDENT_AMBULATORY_CARE_PROVIDER_SITE_OTHER): Payer: Medicare Other

## 2018-04-05 DIAGNOSIS — E063 Autoimmune thyroiditis: Secondary | ICD-10-CM

## 2018-04-05 DIAGNOSIS — E1165 Type 2 diabetes mellitus with hyperglycemia: Secondary | ICD-10-CM | POA: Diagnosis not present

## 2018-04-05 LAB — COMPREHENSIVE METABOLIC PANEL
ALT: 12 U/L (ref 0–35)
AST: 13 U/L (ref 0–37)
Albumin: 3.9 g/dL (ref 3.5–5.2)
Alkaline Phosphatase: 71 U/L (ref 39–117)
BUN: 14 mg/dL (ref 6–23)
CO2: 21 mEq/L (ref 19–32)
Calcium: 9.1 mg/dL (ref 8.4–10.5)
Chloride: 106 mEq/L (ref 96–112)
Creatinine, Ser: 0.69 mg/dL (ref 0.40–1.20)
GFR: 112.3 mL/min (ref >60.00)
Glucose, Bld: 155 mg/dL — ABNORMAL HIGH (ref 70–99)
Potassium: 3.8 mEq/L (ref 3.5–5.1)
Sodium: 134 mEq/L — ABNORMAL LOW (ref 135–145)
Total Bilirubin: 0.4 mg/dL (ref 0.2–1.2)
Total Protein: 7.5 g/dL (ref 6.0–8.3)

## 2018-04-05 LAB — T4, FREE: Free T4: 1.01 ng/dL (ref 0.60–1.60)

## 2018-04-05 LAB — TSH: TSH: 1.53 u[IU]/mL (ref 0.35–4.50)

## 2018-04-05 LAB — HEMOGLOBIN A1C: Hgb A1c MFr Bld: 6.2 % (ref 4.6–6.5)

## 2018-04-07 ENCOUNTER — Encounter (HOSPITAL_COMMUNITY): Payer: Self-pay | Admitting: *Deleted

## 2018-04-07 ENCOUNTER — Other Ambulatory Visit: Payer: Self-pay

## 2018-04-07 ENCOUNTER — Emergency Department (HOSPITAL_COMMUNITY)
Admission: EM | Admit: 2018-04-07 | Discharge: 2018-04-07 | Disposition: A | Discharge status: Home / Self Care | Payer: Medicare Other | Attending: Emergency Medicine | Admitting: Emergency Medicine

## 2018-04-07 DIAGNOSIS — Z79899 Other long term (current) drug therapy: Secondary | ICD-10-CM | POA: Diagnosis not present

## 2018-04-07 DIAGNOSIS — Z7984 Long term (current) use of oral hypoglycemic drugs: Secondary | ICD-10-CM | POA: Diagnosis not present

## 2018-04-07 DIAGNOSIS — R519 Headache, unspecified: Secondary | ICD-10-CM

## 2018-04-07 DIAGNOSIS — R51 Headache: Secondary | ICD-10-CM | POA: Insufficient documentation

## 2018-04-07 DIAGNOSIS — E890 Postprocedural hypothyroidism: Secondary | ICD-10-CM | POA: Insufficient documentation

## 2018-04-07 DIAGNOSIS — E119 Type 2 diabetes mellitus without complications: Secondary | ICD-10-CM | POA: Diagnosis not present

## 2018-04-07 DIAGNOSIS — R112 Nausea with vomiting, unspecified: Secondary | ICD-10-CM | POA: Diagnosis not present

## 2018-04-07 DIAGNOSIS — J45909 Unspecified asthma, uncomplicated: Secondary | ICD-10-CM | POA: Insufficient documentation

## 2018-04-07 MED ORDER — METOCLOPRAMIDE HCL 10 MG PO TABS
5.0000 mg | ORAL_TABLET | Freq: Once | ORAL | Status: AC
  Administered 2018-04-07: 5 mg via ORAL
  Filled 2018-04-07: qty 1

## 2018-04-07 MED ORDER — DIPHENHYDRAMINE HCL 25 MG PO CAPS
25.0000 mg | ORAL_CAPSULE | Freq: Once | ORAL | Status: AC
  Administered 2018-04-07: 25 mg via ORAL
  Filled 2018-04-07: qty 1

## 2018-04-07 MED ORDER — KETOROLAC TROMETHAMINE 30 MG/ML IJ SOLN
30.0000 mg | Freq: Once | INTRAMUSCULAR | Status: AC
  Administered 2018-04-07: 30 mg via INTRAMUSCULAR
  Filled 2018-04-07: qty 1

## 2018-04-07 NOTE — Discharge Instructions (Signed)
Please follow up with your primary care provider within 5-7 days for re-evaluation of your symptoms. If you do not have a primary care provider, information for a healthcare clinic has been provided for you to make arrangements for follow up care. Please return to the emergency department for any new or worsening symptoms. ° °
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
## 2018-04-08 ENCOUNTER — Ambulatory Visit: Payer: Medicare Other | Admitting: Physical Therapy

## 2018-04-08 ENCOUNTER — Encounter: Payer: Self-pay | Admitting: Physical Therapy

## 2018-04-08 DIAGNOSIS — M542 Cervicalgia: Secondary | ICD-10-CM

## 2018-04-08 DIAGNOSIS — M5441 Lumbago with sciatica, right side: Secondary | ICD-10-CM

## 2018-04-08 DIAGNOSIS — M5442 Lumbago with sciatica, left side: Principal | ICD-10-CM

## 2018-04-08 DIAGNOSIS — R42 Dizziness and giddiness: Secondary | ICD-10-CM | POA: Diagnosis not present

## 2018-04-08 NOTE — Patient Instructions (Addendum)
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
## 2018-04-09 ENCOUNTER — Ambulatory Visit (INDEPENDENT_AMBULATORY_CARE_PROVIDER_SITE_OTHER): Payer: Medicare Other | Admitting: Endocrinology

## 2018-04-09 ENCOUNTER — Encounter: Payer: Self-pay | Admitting: Endocrinology

## 2018-04-09 ENCOUNTER — Encounter: Payer: Self-pay | Admitting: Internal Medicine

## 2018-04-09 VITALS — BP 132/84 | HR 75 | Ht 66.0 in | Wt 191.6 lb

## 2018-04-09 DIAGNOSIS — E063 Autoimmune thyroiditis: Secondary | ICD-10-CM | POA: Diagnosis not present

## 2018-04-09 DIAGNOSIS — E1165 Type 2 diabetes mellitus with hyperglycemia: Secondary | ICD-10-CM
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
## 2018-04-10 ENCOUNTER — Encounter: Payer: Self-pay | Admitting: Physical Therapy

## 2018-04-10 ENCOUNTER — Ambulatory Visit: Payer: Medicare Other | Admitting: Physical Therapy

## 2018-04-10 DIAGNOSIS — M542 Cervicalgia: Secondary | ICD-10-CM

## 2018-04-10 DIAGNOSIS — M5442 Lumbago with sciatica, left side: Principal | ICD-10-CM

## 2018-04-10 DIAGNOSIS — R42 Dizziness and giddiness: Secondary | ICD-10-CM | POA: Diagnosis not present

## 2018-04-10 DIAGNOSIS — M5441 Lumbago with sciatica, right side: Secondary | ICD-10-CM
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
## 2018-04-11 ENCOUNTER — Encounter: Payer: Self-pay | Admitting: Internal Medicine

## 2018-04-11 DIAGNOSIS — M5431 Sciatica, right side: Secondary | ICD-10-CM | POA: Diagnosis not present

## 2018-04-11 DIAGNOSIS — Z79899 Other long term (current) drug therapy: Secondary | ICD-10-CM | POA: Diagnosis not present

## 2018-04-11 DIAGNOSIS — Z79891 Long term (current) use of opiate analgesic: Secondary | ICD-10-CM | POA: Diagnosis not present

## 2018-04-12 ENCOUNTER — Encounter: Payer: Self-pay | Admitting: *Deleted

## 2018-04-12 ENCOUNTER — Telehealth: Payer: Self-pay | Admitting: Internal Medicine

## 2018-04-12 NOTE — Telephone Encounter (Signed)
Patient and I conversed through [place] about this

## 2018-04-12 NOTE — Telephone Encounter (Signed)
Dr. Tamala Julian has also talked to the pt via mychart about this.

## 2018-04-12 NOTE — Telephone Encounter (Signed)
Unsure if patient was made aware that PCP has been out of office.

## 2018-04-12 NOTE — Telephone Encounter (Signed)
Copied from [place] [id number]. Topic: General - Other >> Apr 12, 2018  8:24 AM Keene Breath wrote: Reason for CRM: Patient called to check on the status of the message she sent Dr. Sharlet Salina and Dr. Tamala Julian through My Chart.  She stated that she has not heard back from either of the doctors and would like a response.  CB# [phone]

## 2018-04-13 ENCOUNTER — Other Ambulatory Visit: Payer: Self-pay | Admitting: Endocrinology

## 2018-04-13 DIAGNOSIS — Z79899 Other long term (current) drug therapy: Secondary | ICD-10-CM | POA: Diagnosis not present

## 2018-04-13 DIAGNOSIS — E559 Vitamin D deficiency, unspecified: Secondary | ICD-10-CM | POA: Diagnosis not present

## 2018-04-13 DIAGNOSIS — E119 Type 2 diabetes mellitus without complications: Secondary | ICD-10-CM | POA: Diagnosis not present

## 2018-04-13 DIAGNOSIS — E7800 Pure hypercholesterolemia, unspecified: Secondary | ICD-10-CM | POA: Diagnosis not present

## 2018-04-13 DIAGNOSIS — R5383 Other fatigue: Secondary | ICD-10-CM | POA: Diagnosis not present

## 2018-04-13 DIAGNOSIS — R0602 Shortness of breath: Secondary | ICD-10-CM | POA: Diagnosis not present

## 2018-04-14 ENCOUNTER — Other Ambulatory Visit: Payer: Self-pay | Admitting: Internal Medicine

## 2018-04-15 ENCOUNTER — Ambulatory Visit: Payer: Medicare Other | Admitting: Physical Therapy

## 2018-04-15 ENCOUNTER — Encounter: Payer: Self-pay | Admitting: Physical Therapy

## 2018-04-15 DIAGNOSIS — R42 Dizziness and giddiness: Secondary | ICD-10-CM

## 2018-04-15 DIAGNOSIS — M5442 Lumbago with sciatica, left side: Principal | ICD-10-CM

## 2018-04-15 DIAGNOSIS — M5441 Lumbago with sciatica, right side: Secondary | ICD-10-CM | POA: Diagnosis not present

## 2018-04-15 DIAGNOSIS — M542 Cervicalgia: Secondary | ICD-10-CM | POA: Diagnosis not present
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
## 2018-04-16 ENCOUNTER — Ambulatory Visit: Payer: Medicare Other | Admitting: Physical Therapy
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
## 2018-04-17 ENCOUNTER — Ambulatory Visit (INDEPENDENT_AMBULATORY_CARE_PROVIDER_SITE_OTHER): Payer: Medicare Other | Admitting: Family Medicine

## 2018-04-17 VITALS — BP 108/78 | Ht 66.0 in | Wt 191.0 lb

## 2018-04-17 DIAGNOSIS — Q059 Spina bifida, unspecified: Secondary | ICD-10-CM | POA: Diagnosis not present

## 2018-04-17 DIAGNOSIS — G7000 Myasthenia gravis without (acute) exacerbation: Secondary | ICD-10-CM | POA: Diagnosis not present

## 2018-04-17 DIAGNOSIS — G932 Benign intracranial hypertension: Secondary | ICD-10-CM | POA: Diagnosis not present

## 2018-04-17 DIAGNOSIS — G43909 Migraine, unspecified, not intractable, without status migrainosus: Secondary | ICD-10-CM

## 2018-04-17 NOTE — Patient Instructions (Signed)
Good to see you  We will get you back to work next week  COnitnue the diamox if helping.  Will get you in with Dr. Tomi Likens to see if anything else can be done.  I am here if you have questions.

## 2018-04-17 NOTE — Assessment & Plan Note (Signed)
Patient is seeing a headache clinic.  Continues to have chronic headaches over the course of time.  Patient's MRI did show a meningeal seal and neurosurgery did not feel that this was surgical or likely contributing to some of her headaches.  Patient continues to have difficulty overall with some concentration headaches on a regular basis.  Attempted to send patient into physical therapy to help with some neck pain as well as vestibular neuro training.  Had difficulty within 30 minutes.  Laboratory work-up shows her hypothyroidism is under control.  Patient was recently started on Diamox for the possibility of intracranial hypertension.  Patient states
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
## 2018-04-18 ENCOUNTER — Ambulatory Visit: Payer: Medicare Other | Admitting: Physical Therapy

## 2018-04-18 ENCOUNTER — Encounter: Payer: Self-pay | Admitting: Neurology

## 2018-04-18 ENCOUNTER — Ambulatory Visit (INDEPENDENT_AMBULATORY_CARE_PROVIDER_SITE_OTHER): Payer: Medicare Other | Admitting: Neurology

## 2018-04-18 VITALS — BP 110/70 | HR 85 | Ht 66.0 in | Wt 192.0 lb

## 2018-04-18 DIAGNOSIS — M542 Cervicalgia: Secondary | ICD-10-CM | POA: Diagnosis not present

## 2018-04-18 DIAGNOSIS — G44309 Post-traumatic headache, unspecified, not intractable: Secondary | ICD-10-CM | POA: Diagnosis not present

## 2018-04-18 DIAGNOSIS — M5441 Lumbago with sciatica, right side: Secondary | ICD-10-CM

## 2018-04-18 DIAGNOSIS — R42 Dizziness and giddiness: Secondary | ICD-10-CM

## 2018-04-18 DIAGNOSIS — G7000 Myasthenia gravis without (acute) exacerbation: Secondary | ICD-10-CM

## 2018-04-18 DIAGNOSIS — M5442 Lumbago with sciatica, left side: Secondary | ICD-10-CM | POA: Diagnosis not present

## 2018-04-18 MED ORDER — TOPIRAMATE 25 MG PO TABS: 25.0000 mg | ORAL_TABLET | Freq: Every day | ORAL | 5 refills | Status: DC
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
## 2018-04-18 NOTE — Patient Instructions (Addendum)
Stop diamox  Start topirmate 25mg  daily  Continue myasthenia medications as prescribed  Make a good effort with physical therapy, as this will benefit you the most  Return to clinic in 4 months

## 2018-04-18 NOTE — Patient Instructions (Signed)
Access Code: [email or web address]  URL: [email or web address]  Date: 04/18/2018  Prepared by: Elsie Ra   Exercises  Seated Gaze Stabilization with Head Nod - 2 sets - 30 sec hold - 2x daily - 6x weekly  Seated Gaze Stabilization with Head Rotation - 1 reps - 2 sets - 30 hold - 2x daily - 6x weekly
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
## 2018-04-19 ENCOUNTER — Other Ambulatory Visit: Payer: Self-pay | Admitting: Internal Medicine

## 2018-04-22 ENCOUNTER — Encounter: Payer: Self-pay | Admitting: Physical Therapy

## 2018-04-22 ENCOUNTER — Telehealth: Payer: Self-pay | Admitting: Adult Health

## 2018-04-22 ENCOUNTER — Encounter: Payer: Self-pay | Admitting: Family Medicine

## 2018-04-22 DIAGNOSIS — G4733 Obstructive sleep apnea (adult) (pediatric): Secondary | ICD-10-CM

## 2018-04-22 NOTE — Telephone Encounter (Signed)
I called the patient and left a message advising that I would have to cancel her appointment with the sleep lab tomorrow.  She was actually dismissed from [facility] in 2012.  For this reason she cannot be seen at [facility] sleep center as it is a [facility] of [facility].  The patient was encouraged to call back if she had any questions.

## 2018-04-23 ENCOUNTER — Institutional Professional Consult (permissible substitution): Payer: Medicaid Other | Admitting: Neurology

## 2018-04-23 NOTE — Progress Notes (Signed)
Pt calling about going somewhere else for a sleep study she can no longer go to [place]  Please give pt a call back at [phone]

## 2018-04-24 ENCOUNTER — Ambulatory Visit: Payer: Medicare Other | Admitting: Physical Therapy

## 2018-04-24 ENCOUNTER — Ambulatory Visit: Payer: Self-pay | Admitting: Family Medicine

## 2018-04-24 ENCOUNTER — Ambulatory Visit: Payer: Medicare Other | Attending: Internal Medicine

## 2018-04-24 DIAGNOSIS — R42 Dizziness and giddiness: Secondary | ICD-10-CM

## 2018-04-24 DIAGNOSIS — M542 Cervicalgia: Secondary | ICD-10-CM | POA: Diagnosis not present

## 2018-04-24 DIAGNOSIS — M5441 Lumbago with sciatica, right side: Secondary | ICD-10-CM | POA: Diagnosis not present

## 2018-04-24 DIAGNOSIS — M5442 Lumbago with sciatica, left side: Secondary | ICD-10-CM | POA: Diagnosis not present
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
## 2018-04-25 ENCOUNTER — Encounter: Payer: Self-pay | Admitting: Physical Therapy

## 2018-04-26 ENCOUNTER — Telehealth: Payer: Self-pay | Admitting: Neurology

## 2018-04-26 NOTE — Telephone Encounter (Signed)
Patient has been notified that we are waiting on an answer.

## 2018-04-26 NOTE — Telephone Encounter (Signed)
Pt wants to speak with Jade Mathis she states she sent a my chart message and no one sent a message back to her. I explained that we were short staffed and Jade Mathis just got here and I would have someone call her

## 2018-04-27 ENCOUNTER — Encounter (HOSPITAL_COMMUNITY): Payer: Self-pay | Admitting: Emergency Medicine

## 2018-04-27 ENCOUNTER — Emergency Department (HOSPITAL_COMMUNITY)
Admission: EM | Admit: 2018-04-27 | Discharge: 2018-04-27 | Disposition: A | Discharge status: Home / Self Care | Payer: Medicare Other | Attending: Emergency Medicine | Admitting: Emergency Medicine

## 2018-04-27 DIAGNOSIS — Z79899 Other long term (current) drug therapy: Secondary | ICD-10-CM | POA: Diagnosis not present

## 2018-04-27 DIAGNOSIS — R519 Headache, unspecified: Secondary | ICD-10-CM

## 2018-04-27 DIAGNOSIS — R51 Headache: Secondary | ICD-10-CM | POA: Insufficient documentation

## 2018-04-27 DIAGNOSIS — Z7984 Long term (current) use of oral hypoglycemic drugs: Secondary | ICD-10-CM | POA: Insufficient documentation

## 2018-04-27 DIAGNOSIS — E039 Hypothyroidism, unspecified: Secondary | ICD-10-CM | POA: Insufficient documentation

## 2018-04-27 DIAGNOSIS — J45909 Unspecified asthma, uncomplicated: Secondary | ICD-10-CM | POA: Insufficient documentation

## 2018-04-27 DIAGNOSIS — E119 Type 2 diabetes mellitus without complications: Secondary | ICD-10-CM | POA: Insufficient documentation

## 2018-04-27 MED ORDER — KETOROLAC TROMETHAMINE 60 MG/2ML IM SOLN
30.0000 mg | Freq: Once | INTRAMUSCULAR | Status: AC
  Administered 2018-04-27: 30 mg via INTRAMUSCULAR
  Filled 2018-04-27: qty 2

## 2018-04-27 MED ORDER — DIPHENHYDRAMINE HCL 25 MG PO CAPS
25.0000 mg | ORAL_CAPSULE | Freq: Once | ORAL | Status: AC
  Administered 2018-04-27: 25 mg via ORAL
  Filled 2018-04-27: qty 1

## 2018-04-27 MED ORDER — METOCLOPRAMIDE HCL 10 MG PO TABS
10.0000 mg | ORAL_TABLET | Freq: Once | ORAL | Status: AC
  Administered 2018-04-27: 10 mg via ORAL
  Filled 2018-04-27: qty 1
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
## 2018-04-27 NOTE — ED Triage Notes (Signed)
Patient reports migraine headache with photophobia and emesis onset today unrelieved by OTC pain medications .

## 2018-04-27 NOTE — ED Notes (Signed)
Patient verbalizes understanding of discharge instructions. Opportunity for questioning and answers were provided. Armband removed by staff, pt discharged from ED.  

## 2018-04-28 DIAGNOSIS — Z79899 Other long term (current) drug therapy: Secondary | ICD-10-CM | POA: Diagnosis not present

## 2018-04-28 DIAGNOSIS — E119 Type 2 diabetes mellitus without complications: Secondary | ICD-10-CM | POA: Diagnosis not present

## 2018-04-29 ENCOUNTER — Encounter: Payer: Self-pay | Admitting: Pulmonary Disease

## 2018-04-29 ENCOUNTER — Ambulatory Visit (INDEPENDENT_AMBULATORY_CARE_PROVIDER_SITE_OTHER): Payer: Medicare Other | Admitting: Pulmonary Disease

## 2018-04-29 VITALS — BP 114/80 | HR 73 | Ht 66.0 in | Wt 194.2 lb

## 2018-04-29 DIAGNOSIS — G4733 Obstructive sleep apnea (adult) (pediatric): Secondary | ICD-10-CM
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
## 2018-04-29 NOTE — Telephone Encounter (Signed)
Patient is calling. Relayed msg that you were waiting to hear from [place] and would call her back. Thanks!

## 2018-04-29 NOTE — Patient Instructions (Signed)
Obstructive sleep apnea Recurrence of symptoms  We will schedule you for home sleep study I will see you back in about 3 months tentatively depending on above findings  treatment options as discussed   Sleep Apnea Sleep apnea is a condition in which breathing pauses or becomes shallow during sleep. Episodes of sleep apnea usually last 10 seconds or longer, and they may occur as many as 20 times an hour. Sleep apnea disrupts your sleep and keeps your body from getting the rest that it needs. This condition can increase your risk of certain health problems, including:  Heart attack.  Stroke.  Obesity.  Diabetes.  Heart failure.  Irregular heartbeat. There are three kinds of sleep apnea:  Obstructive sleep apnea. This kind is caused by a blocked or collapsed airway.  Central sleep apnea. This kind happens when the part of the brain that controls breathing does not send the correct signals to the muscles that control breathing.  Mixed sleep apnea. This is a combination of obstructive and central sleep apnea. What are the causes? The most common cause of this condition is a collapsed or blocked airway. An airway can collapse or become blocked if:  Your throat muscles are abnormally relaxed.  Your tongue and tonsils are larger than normal.  You are overweight.  Your airway is smaller than normal. What increases the risk? This condition is more likely to develop in people who:  Are overweight.  Smoke.  Have a smaller than normal airway.  Are elderly.  Are female.  Drink alcohol.  Take sedatives or tranquilizers.  Have a family history of sleep apnea. What are the signs or symptoms? Symptoms of this condition include:  Trouble staying asleep.  Daytime sleepiness and tiredness.  Irritability.  Loud snoring.  Morning headaches.  Trouble concentrating.  Forgetfulness.  Decreased interest in sex.  Unexplained sleepiness.  Mood swings.  Personality  changes.  Feelings of depression.  Waking up often during the night to urinate.  Dry mouth.  Sore throat. How is this diagnosed? This condition may be diagnosed with:  A medical history.  A physical exam.  A series of tests that are done while you are sleeping (sleep study). These tests are usually done in a sleep lab, but they may also be done at home. How is this treated? Treatment for this condition aims to restore normal breathing and to ease symptoms during sleep. It may involve managing health issues that can affect breathing, such as high blood pressure or obesity. Treatment may include:  Sleeping on your side.  Using a decongestant if you have nasal congestion.  Avoiding the use of depressants, including alcohol, sedatives, and narcotics.  Losing weight if you are overweight.  Making changes to your diet.  Quitting smoking.  Using a device to open your airway while you sleep, such as: ? An oral appliance. This is a custom-made mouthpiece that shifts your lower jaw forward. ? A continuous positive airway pressure (CPAP) device. This device delivers oxygen to your airway through a mask. ? A nasal expiratory positive airway pressure (EPAP) device. This device has valves that you put into each nostril. ? A bi-level positive airway pressure (BPAP) device. This device delivers oxygen to your airway through a mask.  Surgery if other treatments do not work. During surgery, excess tissue is removed to create a wider airway. It is important to get treatment for sleep apnea. Without treatment, this condition can lead to:  High blood pressure.  Coronary artery disease.  (  Men) An inability to achieve or maintain an erection (impotence).  Reduced thinking abilities. Follow these instructions at home:  Make any lifestyle changes that your health care provider recommends.  Eat a healthy, well-balanced diet.  Take over-the-counter and prescription medicines only as told  by your health care provider.  Avoid using depressants, including alcohol, sedatives, and narcotics.  Take steps to lose weight if you are overweight.  If you were given a device to open your airway while you sleep, use it only as told by your health care provider.  Do not use any tobacco products, such as cigarettes, chewing tobacco, and e-cigarettes. If you need help quitting, ask your health care provider.  Keep all follow-up visits as told by your health care provider. This is important. Contact a health care provider if:  The device that you received to open your airway during sleep is uncomfortable or does not seem to be working.  Your symptoms do not improve.  Your symptoms get worse. Get help right away if:  You develop chest pain.  You develop shortness of breath.  You develop discomfort in your back, arms, or stomach.  You have trouble speaking.  You have weakness on one side of your body.  You have drooping in your face. These symptoms may represent a serious problem that is an emergency. Do not wait to see if the symptoms will go away. Get medical help right away. Call your local emergency services (911 in the [place] Do not drive yourself to the hospital. This information is not intended to replace advice given to you by your health care provider. Make sure you discuss any questions you have with your health care provider. Document Released: 02/24/2002 Document Revised: 10/02/2016 Document Reviewed: 12/14/2014 Elsevier Interactive Patient Education  2019 [employer].

## 2018-04-30 ENCOUNTER — Ambulatory Visit: Payer: Medicare Other | Admitting: Physical Therapy

## 2018-04-30 ENCOUNTER — Encounter: Payer: Self-pay | Admitting: Physical Therapy

## 2018-04-30 DIAGNOSIS — M542 Cervicalgia: Secondary | ICD-10-CM | POA: Diagnosis not present

## 2018-04-30 DIAGNOSIS — R42 Dizziness and giddiness: Secondary | ICD-10-CM | POA: Diagnosis not present

## 2018-04-30 DIAGNOSIS — M5441 Lumbago with sciatica, right side: Secondary | ICD-10-CM | POA: Diagnosis not present

## 2018-04-30 DIAGNOSIS — M5442 Lumbago with sciatica, left side: Principal | ICD-10-CM

## 2018-04-30 NOTE — Telephone Encounter (Signed)
I spoke with patient and she said that she needs a letter stating that her visits to the ER for IV medication is necessary. This is to be addressed "To Whom It May Concern".  (for insurance or ER)
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
## 2018-05-01 ENCOUNTER — Encounter: Payer: Self-pay | Admitting: Physical Therapy

## 2018-05-01 ENCOUNTER — Ambulatory Visit: Payer: Medicare Other | Admitting: Physical Therapy

## 2018-05-01 DIAGNOSIS — M5442 Lumbago with sciatica, left side: Principal | ICD-10-CM

## 2018-05-01 DIAGNOSIS — R42 Dizziness and giddiness: Secondary | ICD-10-CM | POA: Diagnosis not present

## 2018-05-01 DIAGNOSIS — M5441 Lumbago with sciatica, right side: Secondary | ICD-10-CM

## 2018-05-01 DIAGNOSIS — M542 Cervicalgia: Secondary | ICD-10-CM | POA: Diagnosis not present

## 2018-05-01 NOTE — Telephone Encounter (Signed)
I spoke with patient and informed her that we do not do medical necessity letters for the ER.  She is insistent upon getting a letter from Dr. Posey Pronto.  She said that she may want to switch doctors.  Dr. Tamala Julian suggested Dr. Tomi Likens since he is a headache specialist.  She is now requesting to speak to the office manager.  I told her that I would have the office manager call her.

## 2018-05-01 NOTE — Patient Instructions (Addendum)
PELVIC TILT: Posterior    Tighten abdominals, flatten low back. _10__ reps per set, _1 to 2 __ sets per day, _7__ days per week   Copyright  V[employer]. All rights reserved.  Call if questions:  [phone]   In above position.  Wrap band around knees ,  Stretch  Band. Daily 10 x hold 1 second
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
## 2018-05-02 ENCOUNTER — Telehealth: Payer: Self-pay | Admitting: Neurology

## 2018-05-02 DIAGNOSIS — Z903 Acquired absence of stomach [part of]: Secondary | ICD-10-CM | POA: Diagnosis not present

## 2018-05-02 DIAGNOSIS — E782 Mixed hyperlipidemia: Secondary | ICD-10-CM | POA: Diagnosis not present

## 2018-05-02 DIAGNOSIS — E119 Type 2 diabetes mellitus without complications: Secondary | ICD-10-CM | POA: Diagnosis not present

## 2018-05-02 DIAGNOSIS — M064 Inflammatory polyarthropathy: Secondary | ICD-10-CM | POA: Diagnosis not present

## 2018-05-02 DIAGNOSIS — K912 Postsurgical malabsorption, not elsewhere classified: Secondary | ICD-10-CM | POA: Diagnosis not present

## 2018-05-02 DIAGNOSIS — Z9225 Personal history of immunosupression therapy: Secondary | ICD-10-CM | POA: Diagnosis not present

## 2018-05-02 DIAGNOSIS — M5030 Other cervical disc degeneration, unspecified cervical region: Secondary | ICD-10-CM | POA: Diagnosis not present

## 2018-05-02 DIAGNOSIS — G7000 Myasthenia gravis without (acute) exacerbation: Secondary | ICD-10-CM | POA: Diagnosis not present

## 2018-05-02 NOTE — Telephone Encounter (Signed)
Patient called yesterday afternoon on 05/02/18 to discuss her concern as to why Dr. Posey Pronto will not write a letter for her about her recent ER visits for her headaches. I explained to the patient that Dr. Posey Pronto is not able to write a letter for treament that she received in the ER as she was not the physician providing the care. I informed patient that any documentation or letter as to why she is being treated at the ER would need to come from the provider that treated her in the ER. Patient verbalized understanding. Patient wants to be referred to [facility] for her headaches. She is already seeing Dr. Charlann Boxer for her headaches. The only time that she has addressed headaches with Dr. Posey Pronto was at her last visit on 04/18/2018. She commented the she does like Dr.Patel and that she has expressed concern with Dr.Patel in the past not documenting her concerns or issues properly. She is frustrated that Dr. Posey Pronto will not write her a letter for her ER visits even though I have explained the reason why to the patient. She again expresses her dissatisfaction with Dr. Posey Pronto and stated that she would go see her provider in [place] for her headaches. She wanted to transfer her care from Dr. Posey Pronto to Dr. Tomi Likens. I explained to her why we could not accommodate that request as Dr. Tomi Likens does not treat myasthenia gravis. She verbalized understanding and stated that she does want to keep Dr. Posey Pronto for her myasthenia gravis and that she will see the [facility] provider for her headaches. I reiterated that information with the patient to make sure that we both understand how the patient has decided to move forward with her care. I ask the patient if there was anything else that I could do for her she verbalized that she was fine and that I had answered all her questions. I informed patient if she has any further questions or concerns to call me and at that point we ended the call.

## 2018-05-02 NOTE — Telephone Encounter (Signed)
Noted.  Patient will only be followed by me for myasthenia gravis.  For headache management, she will return to [facility].    Donika K. Posey Pronto, DO

## 2018-05-05 ENCOUNTER — Encounter (HOSPITAL_COMMUNITY): Payer: Self-pay | Admitting: *Deleted

## 2018-05-05 ENCOUNTER — Emergency Department (HOSPITAL_COMMUNITY)
Admission: EM | Admit: 2018-05-05 | Discharge: 2018-05-05 | Disposition: A | Discharge status: Home / Self Care | Payer: Medicare Other | Attending: Emergency Medicine | Admitting: Emergency Medicine

## 2018-05-05 ENCOUNTER — Other Ambulatory Visit: Payer: Self-pay

## 2018-05-05 DIAGNOSIS — E119 Type 2 diabetes mellitus without complications: Secondary | ICD-10-CM | POA: Diagnosis not present

## 2018-05-05 DIAGNOSIS — J45909 Unspecified asthma, uncomplicated: Secondary | ICD-10-CM | POA: Insufficient documentation

## 2018-05-05 DIAGNOSIS — Z7901 Long term (current) use of anticoagulants: Secondary | ICD-10-CM | POA: Diagnosis not present

## 2018-05-05 DIAGNOSIS — F0781 Postconcussional syndrome: Secondary | ICD-10-CM

## 2018-05-05 DIAGNOSIS — R51 Headache: Secondary | ICD-10-CM | POA: Diagnosis not present

## 2018-05-05 DIAGNOSIS — R112 Nausea with vomiting, unspecified: Secondary | ICD-10-CM | POA: Diagnosis not present

## 2018-05-05 MED ORDER — DIPHENHYDRAMINE HCL 50 MG/ML IJ SOLN
12.5000 mg | Freq: Once | INTRAMUSCULAR | Status: AC
  Administered 2018-05-05: 12.5 mg via INTRAMUSCULAR
  Filled 2018-05-05: qty 1

## 2018-05-05 MED ORDER — METOCLOPRAMIDE HCL 5 MG/ML IJ SOLN
10.0000 mg | Freq: Once | INTRAMUSCULAR | Status: AC
  Administered 2018-05-05: 10 mg via INTRAMUSCULAR
  Filled 2018-05-05: qty 2

## 2018-05-05 MED ORDER — KETOROLAC TROMETHAMINE 60 MG/2ML IM SOLN
60.0000 mg | Freq: Once | INTRAMUSCULAR | Status: AC
  Administered 2018-05-05: 60 mg via INTRAMUSCULAR
  Filled 2018-05-05: qty 2

## 2018-05-05 NOTE — Discharge Instructions (Addendum)
1. Medications: usual home medications 2. Treatment: rest, drink plenty of fluids,  3. Follow Up: Please followup with your primary doctor in 2-3 days and neurology as soon as possible for discussion of your diagnoses and further evaluation after today's visit; if you do not have a primary care doctor use the resource guide provided to find one; Please return to the ER for high fevers, worsening symptoms or other concerns

## 2018-05-05 NOTE — ED Notes (Signed)
Patient verbalizes understanding of discharge instructions. Opportunity for questioning and answers were provided. Armband removed by staff, pt discharged from ED. Ambulated to lobby
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
## 2018-05-06 ENCOUNTER — Encounter: Payer: Self-pay | Admitting: Physical Therapy

## 2018-05-06 ENCOUNTER — Ambulatory Visit: Payer: Medicare Other | Admitting: Physical Therapy

## 2018-05-06 DIAGNOSIS — M542 Cervicalgia: Secondary | ICD-10-CM

## 2018-05-06 DIAGNOSIS — M5441 Lumbago with sciatica, right side: Secondary | ICD-10-CM | POA: Diagnosis not present

## 2018-05-06 DIAGNOSIS — M5442 Lumbago with sciatica, left side: Secondary | ICD-10-CM | POA: Diagnosis not present

## 2018-05-06 DIAGNOSIS — R42 Dizziness and giddiness: Secondary | ICD-10-CM | POA: Diagnosis not present
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
## 2018-05-07 ENCOUNTER — Encounter: Payer: Self-pay | Admitting: Physical Therapy

## 2018-05-08 ENCOUNTER — Other Ambulatory Visit: Payer: Self-pay

## 2018-05-08 ENCOUNTER — Encounter: Payer: Self-pay | Admitting: Physical Therapy

## 2018-05-08 NOTE — Patient Outreach (Signed)
[facility]) Care Management  05/08/2018  Kiaraliz Rafuse [birth year] [id number]   Referral Date: 05/08/2018 Referral Source: [facility] referral Referral Reason: 5 ED visits since December, refused last 2 [facility]- ED follow up calls   Outreach Attempt: no answer. HIPAA compliant voice message left.  Plan: RN CM will attempt again within 4 business days and send letter.   Jone Baseman, RN, MSN [facility] Care Management Care Management Coordinator Direct Line [phone] Toll Free: [phone]  Fax: [phone]

## 2018-05-09 DIAGNOSIS — M5431 Sciatica, right side: Secondary | ICD-10-CM | POA: Diagnosis not present

## 2018-05-09 DIAGNOSIS — Z79891 Long term (current) use of opiate analgesic: Secondary | ICD-10-CM | POA: Diagnosis not present

## 2018-05-09 DIAGNOSIS — Z79899 Other long term (current) drug therapy: Secondary | ICD-10-CM | POA: Diagnosis not present

## 2018-05-10 ENCOUNTER — Other Ambulatory Visit: Payer: Self-pay

## 2018-05-10 NOTE — Patient Outreach (Signed)
[facility]) Care Management  05/10/2018  Jade Mathis 18-Dec-1975 [id number]   Referral Date: 05/08/2018 Referral Source: UM referral Referral Reason: 5 ED visits since December, refused last 2 UM- ED follow up calls   Outreach Attempt: no answer. HIPAA compliant voice message left.  Plan: RN CM will attempt again within 4 business days.  Jone Baseman, RN, MSN [facility] Care Management Coordinator Direct Line [phone] Cell [phone] Toll Free: [phone]  Fax: [phone]

## 2018-05-11 DIAGNOSIS — M25571 Pain in right ankle and joints of right foot: Secondary | ICD-10-CM | POA: Diagnosis not present

## 2018-05-11 DIAGNOSIS — Z79899 Other long term (current) drug therapy: Secondary | ICD-10-CM | POA: Diagnosis not present

## 2018-05-11 DIAGNOSIS — E119 Type 2 diabetes mellitus without complications: Secondary | ICD-10-CM | POA: Diagnosis not present

## 2018-05-11 DIAGNOSIS — M79671 Pain in right foot: Secondary | ICD-10-CM | POA: Diagnosis not present

## 2018-05-13 ENCOUNTER — Other Ambulatory Visit: Payer: Self-pay

## 2018-05-13 ENCOUNTER — Ambulatory Visit: Payer: Medicare Other | Admitting: Physical Therapy

## 2018-05-13 ENCOUNTER — Encounter: Payer: Self-pay | Admitting: Physical Therapy

## 2018-05-13 ENCOUNTER — Ambulatory Visit: Payer: Self-pay | Admitting: Neurology

## 2018-05-13 DIAGNOSIS — M5442 Lumbago with sciatica, left side: Principal | ICD-10-CM

## 2018-05-13 DIAGNOSIS — M542 Cervicalgia: Secondary | ICD-10-CM | POA: Diagnosis not present

## 2018-05-13 DIAGNOSIS — R42 Dizziness and giddiness: Secondary | ICD-10-CM | POA: Diagnosis not present

## 2018-05-13 DIAGNOSIS — M5441 Lumbago with sciatica, right side: Secondary | ICD-10-CM | POA: Diagnosis not present

## 2018-05-13 NOTE — Patient Outreach (Signed)
[facility]) Care Management  05/13/2018  Jade Mathis 1975-07-10 [id number]   Referral Date:05/08/2018 Referral Source:UM referral Referral Reason:5 ED visits since December, refused last 2 UM- ED follow up calls   Outreach Attempt:no answer. HIPAA compliant voice message left.  Plan:RN CM will wait return call.  If no return call will close case.  Jone Baseman, RN, MSN [facility] Care Management Coordinator Direct Line [phone] Cell [phone] Toll Free: [phone]  Fax: [phone]
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
## 2018-05-14 ENCOUNTER — Encounter: Payer: Self-pay | Admitting: Physical Therapy

## 2018-05-14 ENCOUNTER — Ambulatory Visit: Payer: Self-pay

## 2018-05-14 ENCOUNTER — Ambulatory Visit: Payer: Medicare Other | Admitting: Physical Therapy

## 2018-05-14 DIAGNOSIS — M542 Cervicalgia: Secondary | ICD-10-CM

## 2018-05-14 DIAGNOSIS — R42 Dizziness and giddiness: Secondary | ICD-10-CM | POA: Diagnosis not present

## 2018-05-14 DIAGNOSIS — M5441 Lumbago with sciatica, right side: Secondary | ICD-10-CM

## 2018-05-14 DIAGNOSIS — M5442 Lumbago with sciatica, left side: Principal | ICD-10-CM
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
## 2018-05-14 NOTE — Patient Instructions (Signed)
Keep moving and exercising.

## 2018-05-15 ENCOUNTER — Encounter: Payer: Self-pay | Admitting: Physical Therapy

## 2018-05-15 DIAGNOSIS — Z7189 Other specified counseling: Secondary | ICD-10-CM | POA: Diagnosis not present

## 2018-05-15 DIAGNOSIS — G43709 Chronic migraine without aura, not intractable, without status migrainosus: Secondary | ICD-10-CM | POA: Diagnosis not present

## 2018-05-15 DIAGNOSIS — G7000 Myasthenia gravis without (acute) exacerbation: Secondary | ICD-10-CM | POA: Diagnosis not present

## 2018-05-18 ENCOUNTER — Other Ambulatory Visit: Payer: Self-pay | Admitting: Internal Medicine

## 2018-05-20 ENCOUNTER — Ambulatory Visit: Payer: Medicare Other

## 2018-05-21 ENCOUNTER — Ambulatory Visit: Payer: Medicare Other | Attending: Internal Medicine | Admitting: Physical Therapy

## 2018-05-21 ENCOUNTER — Encounter: Payer: Self-pay | Admitting: Physical Therapy

## 2018-05-21 DIAGNOSIS — M5441 Lumbago with sciatica, right side: Secondary | ICD-10-CM | POA: Insufficient documentation

## 2018-05-21 DIAGNOSIS — R42 Dizziness and giddiness: Secondary | ICD-10-CM | POA: Insufficient documentation

## 2018-05-21 DIAGNOSIS — M542 Cervicalgia: Secondary | ICD-10-CM | POA: Insufficient documentation

## 2018-05-21 DIAGNOSIS — M5442 Lumbago with sciatica, left side: Secondary | ICD-10-CM | POA: Diagnosis not present
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
## 2018-05-22 ENCOUNTER — Encounter: Payer: Self-pay | Admitting: Physical Therapy

## 2018-05-22 ENCOUNTER — Other Ambulatory Visit: Payer: Self-pay

## 2018-05-22 ENCOUNTER — Ambulatory Visit: Payer: Medicare Other | Admitting: Physical Therapy

## 2018-05-22 DIAGNOSIS — M5441 Lumbago with sciatica, right side: Secondary | ICD-10-CM | POA: Diagnosis not present

## 2018-05-22 DIAGNOSIS — M542 Cervicalgia: Secondary | ICD-10-CM

## 2018-05-22 DIAGNOSIS — M5442 Lumbago with sciatica, left side: Principal | ICD-10-CM

## 2018-05-22 DIAGNOSIS — R42 Dizziness and giddiness: Secondary | ICD-10-CM | POA: Diagnosis not present

## 2018-05-22 NOTE — Patient Outreach (Signed)
[facility]) Care Management  05/22/2018  Jade Mathis 11-28-1975 [id number]   Multiple attempts to establish contact with patient without success. No response from letter mailed to patient.   Plan: RN CM will close case at this time.   Jone Baseman, RN, MSN [facility] Care Management Coordinator Direct Line [phone] Cell [phone] Toll Free: [phone]  Fax: [phone]
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
## 2018-05-23 DIAGNOSIS — G4710 Hypersomnia, unspecified: Secondary | ICD-10-CM | POA: Diagnosis not present

## 2018-05-23 DIAGNOSIS — G4733 Obstructive sleep apnea (adult) (pediatric): Secondary | ICD-10-CM

## 2018-05-25 ENCOUNTER — Other Ambulatory Visit: Payer: Self-pay | Admitting: Internal Medicine

## 2018-05-27 ENCOUNTER — Other Ambulatory Visit: Payer: Self-pay | Admitting: Internal Medicine

## 2018-05-27 ENCOUNTER — Other Ambulatory Visit: Payer: Self-pay | Admitting: Family Medicine

## 2018-05-27 ENCOUNTER — Encounter: Payer: Self-pay | Admitting: Physical Therapy

## 2018-05-28 ENCOUNTER — Telehealth: Payer: Self-pay | Admitting: Pulmonary Disease

## 2018-05-28 DIAGNOSIS — G4734 Idiopathic sleep related nonobstructive alveolar hypoventilation: Secondary | ICD-10-CM

## 2018-05-28 DIAGNOSIS — G4710 Hypersomnia, unspecified: Secondary | ICD-10-CM | POA: Diagnosis not present

## 2018-05-28 NOTE — Telephone Encounter (Signed)
Attempted to call patient today regarding results. I did not receive an answer at time of call. I have left a voicemail message for pt to return call. X1  

## 2018-05-28 NOTE — Telephone Encounter (Signed)
Dr. Ander Slade has reviewed the home sleep test this showed Mild OSA  AHI 4, lowest O2 was 80%.   Recommendations   Treatment options - sleep position optimization by encouraging sleep in a lateral position, elevating the head of the bed by about 30 degrees may help. Regular exercises will help promote good quality of sleep.    Weight loss measures encouraged.   Advise against driving while sleepy & against medication with sedative side effects.   If clinical suspicion of significant sleep apnea is very high, an in lab study may be considered. Please make appointment for 3 months for compliance with download with Dr. Ander Slade.

## 2018-05-30 ENCOUNTER — Encounter: Payer: Self-pay | Admitting: Physical Therapy

## 2018-05-30 ENCOUNTER — Ambulatory Visit: Payer: Medicare Other | Admitting: Physical Therapy

## 2018-05-30 ENCOUNTER — Other Ambulatory Visit: Payer: Self-pay

## 2018-05-30 DIAGNOSIS — M5442 Lumbago with sciatica, left side: Secondary | ICD-10-CM | POA: Diagnosis not present

## 2018-05-30 DIAGNOSIS — M5441 Lumbago with sciatica, right side: Secondary | ICD-10-CM | POA: Diagnosis not present

## 2018-05-30 DIAGNOSIS — M542 Cervicalgia: Secondary | ICD-10-CM

## 2018-05-30 DIAGNOSIS — R42 Dizziness and giddiness: Secondary | ICD-10-CM | POA: Diagnosis not present
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
## 2018-05-31 ENCOUNTER — Encounter

## 2018-05-31 NOTE — Telephone Encounter (Signed)
Called and advised that the she would need an ono for low o2 SATs while doing hst she verbalized understanding and the order has been placed

## 2018-06-04 ENCOUNTER — Emergency Department (HOSPITAL_COMMUNITY)
Admission: EM | Admit: 2018-06-04 | Discharge: 2018-06-04 | Disposition: A | Discharge status: Home / Self Care | Payer: Medicare Other | Source: Home / Self Care | Attending: Emergency Medicine | Admitting: Emergency Medicine

## 2018-06-04 ENCOUNTER — Other Ambulatory Visit: Payer: Self-pay

## 2018-06-04 ENCOUNTER — Ambulatory Visit: Payer: Medicare Other | Admitting: Physical Therapy

## 2018-06-04 ENCOUNTER — Emergency Department (HOSPITAL_COMMUNITY)
Admission: EM | Admit: 2018-06-04 | Discharge: 2018-06-04 | Disposition: A | Discharge status: Home / Self Care | Payer: Medicare Other | Attending: Emergency Medicine | Admitting: Emergency Medicine

## 2018-06-04 ENCOUNTER — Encounter (HOSPITAL_COMMUNITY): Payer: Self-pay | Admitting: Emergency Medicine

## 2018-06-04 ENCOUNTER — Emergency Department (HOSPITAL_COMMUNITY): Payer: Medicare Other

## 2018-06-04 ENCOUNTER — Encounter: Payer: Self-pay | Admitting: Internal Medicine

## 2018-06-04 DIAGNOSIS — E119 Type 2 diabetes mellitus without complications: Secondary | ICD-10-CM | POA: Insufficient documentation

## 2018-06-04 DIAGNOSIS — E038 Other specified hypothyroidism: Secondary | ICD-10-CM | POA: Diagnosis not present

## 2018-06-04 DIAGNOSIS — E039 Hypothyroidism, unspecified: Secondary | ICD-10-CM | POA: Insufficient documentation

## 2018-06-04 DIAGNOSIS — J45909 Unspecified asthma, uncomplicated: Secondary | ICD-10-CM

## 2018-06-04 DIAGNOSIS — R519 Headache, unspecified: Secondary | ICD-10-CM

## 2018-06-04 DIAGNOSIS — R51 Headache: Secondary | ICD-10-CM

## 2018-06-04 DIAGNOSIS — J0140 Acute pansinusitis, unspecified: Secondary | ICD-10-CM | POA: Insufficient documentation

## 2018-06-04 DIAGNOSIS — R509 Fever, unspecified: Secondary | ICD-10-CM

## 2018-06-04 DIAGNOSIS — Z7984 Long term (current) use of oral hypoglycemic drugs: Secondary | ICD-10-CM | POA: Insufficient documentation

## 2018-06-04 DIAGNOSIS — Z79899 Other long term (current) drug therapy: Secondary | ICD-10-CM | POA: Insufficient documentation

## 2018-06-04 DIAGNOSIS — G43009 Migraine without aura, not intractable, without status migrainosus: Secondary | ICD-10-CM | POA: Diagnosis not present

## 2018-06-04 LAB — CBC WITH DIFFERENTIAL/PLATELET
Abs Immature Granulocytes: 0.03 10*3/uL (ref 0.00–0.07)
Basophils Absolute: 0 10*3/uL (ref 0.0–0.1)
Basophils Relative: 0 %
Eosinophils Absolute: 0.1 10*3/uL (ref 0.0–0.5)
Eosinophils Relative: 1 %
HCT: 35.5 % — ABNORMAL LOW (ref 36.0–46.0)
Hemoglobin: 11.1 g/dL — ABNORMAL LOW (ref 12.0–15.0)
Immature Granulocytes: 0 %
Lymphocytes Relative: 9 %
Lymphs Abs: 0.8 10*3/uL (ref 0.7–4.0)
MCH: 26.4 pg (ref 26.0–34.0)
MCHC: 31.3 g/dL (ref 30.0–36.0)
MCV: 84.5 fL (ref 80.0–100.0)
Monocytes Absolute: 0.5 10*3/uL (ref 0.1–1.0)
Monocytes Relative: 6 %
Neutro Abs: 7.3 10*3/uL (ref 1.7–7.7)
Neutrophils Relative %: 84 %
Platelets: 226 10*3/uL (ref 150–400)
RBC: 4.2 MIL/uL (ref 3.87–5.11)
RDW: 13.1 % (ref 11.5–15.5)
WBC: 8.8 10*3/uL (ref 4.0–10.5)
nRBC: 0 % (ref 0.0–0.2)

## 2018-06-04 LAB — BASIC METABOLIC PANEL
Anion gap: 8 (ref 5–15)
BUN: 17 mg/dL (ref 6–20)
CO2: 24 mmol/L (ref 22–32)
Calcium: 9.1 mg/dL (ref 8.9–10.3)
Chloride: 101 mmol/L (ref 98–111)
Creatinine, Ser: 0.71 mg/dL (ref 0.44–1.00)
GFR calc Af Amer: >60 mL/min (ref >60)
GFR calc non Af Amer: >60 mL/min (ref >60)
Glucose, Bld: 183 mg/dL — ABNORMAL HIGH (ref 70–99)
Potassium: 4 mmol/L (ref 3.5–5.1)
Sodium: 133 mmol/L — ABNORMAL LOW (ref 135–145)

## 2018-06-04 LAB — I-STAT BETA HCG BLOOD, ED (MC, WL, AP ONLY): I-stat hCG, quantitative: <5 m[IU]/mL (ref <5)

## 2018-06-04 MED ORDER — AMOXICILLIN-POT CLAVULANATE 875-125 MG PO TABS: 1.0000 | ORAL_TABLET | Freq: Two times a day (BID) | ORAL | 0 refills | Status: DC

## 2018-06-04 MED ORDER — DIPHENHYDRAMINE HCL 50 MG/ML IJ SOLN
25.0000 mg | Freq: Once | INTRAMUSCULAR | Status: AC
  Administered 2018-06-04: 25 mg via INTRAVENOUS
  Filled 2018-06-04: qty 1

## 2018-06-04 MED ORDER — BUTALBITAL-APAP-CAFFEINE 50-325-40 MG PO TABS: 1.0000 | ORAL_TABLET | Freq: Four times a day (QID) | ORAL | 0 refills | Status: AC | PRN

## 2018-06-04 MED ORDER — DIPHENHYDRAMINE HCL 25 MG PO CAPS
25.0000 mg | ORAL_CAPSULE | Freq: Once | ORAL | Status: AC
  Administered 2018-06-04: 25 mg via ORAL
  Filled 2018-06-04: qty 1

## 2018-06-04 MED ORDER — FLUTICASONE PROPIONATE 50 MCG/ACT NA SUSP: 1.0000 | Freq: Every day | NASAL | 2 refills | Status: DC

## 2018-06-04 MED ORDER — METOCLOPRAMIDE HCL 5 MG/ML IJ SOLN
10.0000 mg | Freq: Once | INTRAMUSCULAR | Status: AC
  Administered 2018-06-04: 10 mg via INTRAVENOUS
  Filled 2018-06-04: qty 2

## 2018-06-04 MED ORDER — KETOROLAC TROMETHAMINE 30 MG/ML IJ SOLN
30.0000 mg | Freq: Once | INTRAMUSCULAR | Status: AC
  Administered 2018-06-04: 30 mg via INTRAVENOUS
  Filled 2018-06-04: qty 1

## 2018-06-04 MED ORDER — ACETAMINOPHEN 500 MG PO TABS
1000.0000 mg | ORAL_TABLET | Freq: Once | ORAL | Status: AC
  Administered 2018-06-04: 1000 mg via ORAL
  Filled 2018-06-04: qty 2

## 2018-06-04 MED ORDER — SODIUM CHLORIDE 0.9 % IV BOLUS
500.0000 mL | Freq: Once | INTRAVENOUS | Status: AC
  Administered 2018-06-04: 500 mL via INTRAVENOUS

## 2018-06-04 MED ORDER — KETOROLAC TROMETHAMINE 15 MG/ML IJ SOLN
15.0000 mg | Freq: Once | INTRAMUSCULAR | Status: AC
  Administered 2018-06-04: 15 mg via INTRAMUSCULAR
  Filled 2018-06-04: qty 1

## 2018-06-04 MED ORDER — AMOXICILLIN-POT CLAVULANATE 875-125 MG PO TABS
1.0000 | ORAL_TABLET | Freq: Once | ORAL | Status: AC
  Administered 2018-06-04: 1 via ORAL
  Filled 2018-06-04: qty 1

## 2018-06-04 MED ORDER — METOCLOPRAMIDE HCL 5 MG/ML IJ SOLN
10.0000 mg | Freq: Once | INTRAMUSCULAR | Status: AC
  Administered 2018-06-04: 10 mg via INTRAMUSCULAR
  Filled 2018-06-04: qty 2

## 2018-06-04 NOTE — ED Triage Notes (Signed)
Pt returns tonight with worse HA and fever. Pt was seen and treated  This morning for same.

## 2018-06-04 NOTE — Discharge Instructions (Signed)
Return to the ED for worsening symptoms, head injuries or falls, numbness in arms or legs, blurry vision, neck pain with fever.

## 2018-06-04 NOTE — ED Triage Notes (Signed)
Patient arrives POV c/o right sided headache worsening yesterday. Reports some sinus congestion. Denies any nausea or vomiting.
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
## 2018-06-04 NOTE — Discharge Instructions (Signed)
Please read and follow all provided instructions.  Your diagnoses today include:  1. Acute pansinusitis, recurrence not specified   2. Febrile illness   3. Recurrent headache     Tests performed today include:  Blood counts and electrolytes  Vital signs. See below for your results today.   Medications:  In the Emergency Department you received:  Reglan - antinausea/headache medication  Benadryl - antihistamine to counteract potential side effects of reglan  Toradol - NSAID medication similar to ibuprofen   Augmentin - antibiotic  You have been prescribed an antibiotic medicine: take the entire course of medicine even if you are feeling better. Stopping early can cause the antibiotic not to work.  Take any prescribed medications only as directed.  Additional information:  Follow any educational materials contained in this packet.  You are having a headache. No specific cause was found today for your headache. It may have been a migraine or other cause of headache. Stress, anxiety, fatigue, and depression are common triggers for headaches.   Your headache today does not appear to be life-threatening or require hospitalization, but often the exact cause of headaches is not determined in the emergency department. Therefore, follow-up with your doctor is very important to find out what may have caused your headache and whether or not you need any further diagnostic testing or treatment.   Sometimes headaches can appear benign (not harmful), but then more serious symptoms can develop which should prompt an immediate re-evaluation by your doctor or the emergency department.  BE VERY CAREFUL not to take multiple medicines containing Tylenol (also called acetaminophen). Doing so can lead to an overdose which can damage your liver and cause liver failure and possibly death.   Follow-up instructions: Please follow-up with your primary care provider in the next 3 days for further  evaluation of your symptoms.   Return instructions:   Please return to the Emergency Department if you experience worsening symptoms.  Return if the medications do not resolve your headache, if it recurs, or if you have multiple episodes of vomiting or cannot keep down fluids.  Return if you have a change from the usual headache.  RETURN IMMEDIATELY IF you:  Develop a sudden, severe headache  Develop confusion or become poorly responsive or faint  Develop a fever above 100.78F or problem breathing  Have a change in speech, vision, swallowing, or understanding  Develop new weakness, numbness, tingling, incoordination in your arms or legs  Have a seizure  Please return if you have any other emergent concerns.  Additional Information:  Your vital signs today were: BP 128/81    Pulse 91    Temp (!) 102.6 F (39.2 C) (Oral)    Resp 18    Ht 5\' 6"  (1.676 m)    Wt 82.3 kg    SpO2 97%    BMI 29.28 kg/m  If your blood pressure (BP) was elevated above 135/85 this visit, please have this repeated by your doctor within one month. --------------

## 2018-06-04 NOTE — ED Notes (Signed)
Pt discharged from ED; instructions provided and scripts given; Pt encouraged to return to ED if symptoms worsen and to f/u with PCP; Pt verbalized understanding of all instructions 

## 2018-06-04 NOTE — ED Notes (Signed)
RN attempted to review discharge instructions with patient. Patient states that the EDP told her everything and does not want to be spoken with anymore. IV removed.

## 2018-06-06 ENCOUNTER — Encounter: Payer: Self-pay | Admitting: Physical Therapy

## 2018-06-06 ENCOUNTER — Ambulatory Visit: Payer: Self-pay | Admitting: Internal Medicine

## 2018-06-06 ENCOUNTER — Telehealth: Payer: Self-pay | Admitting: Emergency Medicine

## 2018-06-06 ENCOUNTER — Telehealth: Payer: Self-pay

## 2018-06-06 ENCOUNTER — Ambulatory Visit: Payer: Medicare Other | Admitting: Physical Therapy

## 2018-06-06 DIAGNOSIS — Z20828 Contact with and (suspected) exposure to other viral communicable diseases: Secondary | ICD-10-CM

## 2018-06-06 NOTE — Telephone Encounter (Signed)
Patient called, continues to have fever of 100F, per dr Sharlet Salina, patient advised to go to tent for testing, order has been placed

## 2018-06-07 DIAGNOSIS — M5431 Sciatica, right side: Secondary | ICD-10-CM | POA: Diagnosis not present

## 2018-06-09 ENCOUNTER — Encounter: Payer: Self-pay | Admitting: Internal Medicine

## 2018-06-10 ENCOUNTER — Telehealth: Payer: Self-pay | Admitting: Internal Medicine

## 2018-06-10 NOTE — Telephone Encounter (Signed)
Pt called asking about Covid results and if Dr. Sharlet Salina can send in a medication to help her feel better.

## 2018-06-10 NOTE — Telephone Encounter (Signed)
Copied from [place] [id number]. Topic: Quick Communication - See Telephone Encounter >> Jun 10, 2018 12:50 PM Bea Graff, NT wrote: CRM for notification. See Telephone encounter for: 06/10/18. Pt calling to request her coronavirus results as soon as they come back. Does state she is having on and of fevers still. Breathing seems to be ok per pt.

## 2018-06-10 NOTE — Telephone Encounter (Signed)
fyi

## 2018-06-11 ENCOUNTER — Ambulatory Visit: Payer: Medicare Other | Admitting: Physical Therapy

## 2018-06-11 ENCOUNTER — Other Ambulatory Visit: Payer: Self-pay | Admitting: Endocrinology

## 2018-06-12 LAB — NOVEL CORONAVIRUS, NAA: SARS-CoV-2, NAA: NOT DETECTED

## 2018-06-12 NOTE — Telephone Encounter (Signed)
Pt is calling back checking on the corona virus results

## 2018-06-13 ENCOUNTER — Ambulatory Visit: Payer: Medicare Other | Admitting: Physical Therapy

## 2018-06-13 ENCOUNTER — Telehealth: Payer: Self-pay

## 2018-06-13 NOTE — Telephone Encounter (Signed)
Letter done, I have advised patient, she will get letter off mychart

## 2018-06-13 NOTE — Telephone Encounter (Signed)
Patient called to say that she have been waiting on a call regarding test results and also states that she will be needing a letter for her job. Please call patient at Ph# [phone]

## 2018-06-13 NOTE — Telephone Encounter (Signed)
What kind of letter is needed?

## 2018-06-13 NOTE — Telephone Encounter (Signed)
Routing to dr crawford---patient states the letter needs to say she was tested for COVID19 virus last thursday, and the test results are negative---she is ok to return to work immediately-----please call patient when letter is ready, she will pick up today if possible

## 2018-06-13 NOTE — Telephone Encounter (Signed)
Error

## 2018-06-13 NOTE — Telephone Encounter (Signed)
Patient could see the negative results on MyChart. She is needing a letter for her job as soon as possible. Please advise. Patient can be reached at [phone].

## 2018-06-13 NOTE — Telephone Encounter (Signed)
Fine to do letter 

## 2018-06-13 NOTE — Telephone Encounter (Signed)
Routing to dr crawford, please advise, thanks 

## 2018-06-15 ENCOUNTER — Other Ambulatory Visit: Payer: Self-pay | Admitting: Internal Medicine

## 2018-06-17 ENCOUNTER — Ambulatory Visit: Payer: Medicare Other | Admitting: Physical Therapy

## 2018-06-18 ENCOUNTER — Ambulatory Visit: Payer: Medicare Other | Admitting: Physical Therapy

## 2018-07-08 ENCOUNTER — Other Ambulatory Visit: Payer: Self-pay

## 2018-07-08 ENCOUNTER — Encounter: Payer: Self-pay | Admitting: Internal Medicine

## 2018-07-08 DIAGNOSIS — Z79899 Other long term (current) drug therapy: Secondary | ICD-10-CM | POA: Diagnosis not present

## 2018-07-08 DIAGNOSIS — M5431 Sciatica, right side: Secondary | ICD-10-CM | POA: Diagnosis not present

## 2018-07-08 DIAGNOSIS — Z79891 Long term (current) use of opiate analgesic: Secondary | ICD-10-CM | POA: Diagnosis not present

## 2018-07-09 ENCOUNTER — Other Ambulatory Visit (INDEPENDENT_AMBULATORY_CARE_PROVIDER_SITE_OTHER): Payer: Medicare Other

## 2018-07-09 ENCOUNTER — Other Ambulatory Visit: Payer: Self-pay

## 2018-07-09 DIAGNOSIS — E1165 Type 2 diabetes mellitus with hyperglycemia: Secondary | ICD-10-CM

## 2018-07-09 DIAGNOSIS — E063 Autoimmune thyroiditis: Secondary | ICD-10-CM | POA: Diagnosis not present

## 2018-07-09 LAB — LIPID PANEL
Cholesterol: 157 mg/dL (ref 0–200)
HDL: 32.9 mg/dL — ABNORMAL LOW (ref >39.00)
LDL Cholesterol: 105 mg/dL — ABNORMAL HIGH (ref 0–99)
NonHDL: 124.24
Total CHOL/HDL Ratio: 5
Triglycerides: 95 mg/dL (ref 0.0–149.0)
VLDL: 19 mg/dL (ref 0.0–40.0)

## 2018-07-09 LAB — BASIC METABOLIC PANEL
BUN: 14 mg/dL (ref 6–23)
CO2: 26 mEq/L (ref 19–32)
Calcium: 9.2 mg/dL (ref 8.4–10.5)
Chloride: 103 mEq/L (ref 96–112)
Creatinine, Ser: 0.63 mg/dL (ref 0.40–1.20)
GFR: 124.57 mL/min (ref >60.00)
Glucose, Bld: 159 mg/dL — ABNORMAL HIGH (ref 70–99)
Potassium: 3.9 mEq/L (ref 3.5–5.1)
Sodium: 136 mEq/L (ref 135–145)

## 2018-07-09 LAB — HEMOGLOBIN A1C: Hgb A1c MFr Bld: 6.9 % — ABNORMAL HIGH (ref 4.6–6.5)

## 2018-07-09 LAB — TSH: TSH: 0.26 u[IU]/mL — ABNORMAL LOW (ref 0.35–4.50)

## 2018-07-10 NOTE — Progress Notes (Signed)
This encounter was created in error - please disregard.

## 2018-07-11 ENCOUNTER — Encounter: Payer: Medicare Other | Admitting: Endocrinology

## 2018-07-11 ENCOUNTER — Encounter: Payer: Self-pay | Admitting: Endocrinology

## 2018-07-11 ENCOUNTER — Other Ambulatory Visit: Payer: Self-pay

## 2018-07-11 ENCOUNTER — Telehealth: Payer: Self-pay
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
## 2018-07-12 ENCOUNTER — Other Ambulatory Visit: Payer: Self-pay | Admitting: Endocrinology

## 2018-07-13 DIAGNOSIS — Z79899 Other long term (current) drug therapy: Secondary | ICD-10-CM | POA: Diagnosis not present

## 2018-07-15 ENCOUNTER — Ambulatory Visit (INDEPENDENT_AMBULATORY_CARE_PROVIDER_SITE_OTHER): Payer: Medicare Other | Admitting: Endocrinology

## 2018-07-15 ENCOUNTER — Other Ambulatory Visit: Payer: Self-pay

## 2018-07-15 ENCOUNTER — Encounter: Payer: Self-pay | Admitting: Endocrinology

## 2018-07-15 DIAGNOSIS — E063 Autoimmune thyroiditis: Secondary | ICD-10-CM

## 2018-07-15 DIAGNOSIS — E782 Mixed hyperlipidemia: Secondary | ICD-10-CM | POA: Diagnosis not present

## 2018-07-15 DIAGNOSIS — E1165 Type 2 diabetes mellitus with hyperglycemia: Secondary | ICD-10-CM | POA: Diagnosis not present

## 2018-07-15 MED ORDER — INSULIN PEN NEEDLE 31G X 5 MM MISC: 1 refills | Status: AC

## 2018-07-15 MED ORDER — INSULIN GLARGINE 100 UNIT/ML SOLOSTAR PEN: PEN_INJECTOR | SUBCUTANEOUS | 0 refills | Status: DC

## 2018-07-15 MED ORDER — LEVOTHYROXINE SODIUM 175 MCG PO TABS: ORAL_TABLET | ORAL | 1 refills | Status: DC

## 2018-07-15 MED ORDER — METFORMIN HCL ER 500 MG PO TB24: ORAL_TABLET | ORAL | 1 refills | Status: DC
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
## 2018-07-15 NOTE — Telephone Encounter (Signed)
Error

## 2018-07-22 ENCOUNTER — Telehealth: Payer: Self-pay | Admitting: Endocrinology

## 2018-07-22 NOTE — Telephone Encounter (Signed)
Patient dismissed from [facility] by Elayne Snare MD, effective 07/11/18. Dismissal Letter sent out by 1st class mail. [employer]

## 2018-07-24 ENCOUNTER — Encounter: Payer: Self-pay | Admitting: Internal Medicine

## 2018-07-24 ENCOUNTER — Telehealth: Payer: Self-pay | Admitting: *Deleted

## 2018-07-24 DIAGNOSIS — E669 Obesity, unspecified: Principal | ICD-10-CM

## 2018-07-24 DIAGNOSIS — E890 Postprocedural hypothyroidism: Secondary | ICD-10-CM

## 2018-07-24 DIAGNOSIS — E1169 Type 2 diabetes mellitus with other specified complication: Secondary | ICD-10-CM

## 2018-07-24 NOTE — Telephone Encounter (Signed)
Copied from [place] [id number]. Topic: Referral - Request for Referral >> Jul 24, 2018  3:13 PM Antonieta Iba C wrote: Pt called in to request a referral to a different Endocrinologist. Pt says that she was dismissed from her current one.  CB: [phone]

## 2018-07-25 ENCOUNTER — Telehealth: Payer: Self-pay

## 2018-07-25 NOTE — Telephone Encounter (Signed)
Message left for patient that her previous message was received and it has been sent to Dr. Sharlet Salina to address.

## 2018-07-25 NOTE — Telephone Encounter (Signed)
Referral placed.

## 2018-07-31 ENCOUNTER — Other Ambulatory Visit: Payer: Self-pay | Admitting: Internal Medicine

## 2018-07-31 ENCOUNTER — Other Ambulatory Visit: Payer: Self-pay | Admitting: Family Medicine

## 2018-08-05 ENCOUNTER — Ambulatory Visit: Payer: Medicare Other | Admitting: Physical Therapy

## 2018-08-05 DIAGNOSIS — Z79891 Long term (current) use of opiate analgesic: Secondary | ICD-10-CM | POA: Diagnosis not present

## 2018-08-05 DIAGNOSIS — M5431 Sciatica, right side: Secondary | ICD-10-CM | POA: Diagnosis not present

## 2018-08-05 DIAGNOSIS — Z9189 Other specified personal risk factors, not elsewhere classified: Secondary | ICD-10-CM | POA: Diagnosis not present

## 2018-08-05 DIAGNOSIS — Z79899 Other long term (current) drug therapy: Secondary | ICD-10-CM | POA: Diagnosis not present

## 2018-08-06 ENCOUNTER — Encounter: Payer: Self-pay | Admitting: Endocrinology

## 2018-08-06 ENCOUNTER — Ambulatory Visit (INDEPENDENT_AMBULATORY_CARE_PROVIDER_SITE_OTHER): Payer: Medicare Other | Admitting: Endocrinology

## 2018-08-06 ENCOUNTER — Other Ambulatory Visit: Payer: Self-pay

## 2018-08-06 DIAGNOSIS — Z9884 Bariatric surgery status: Secondary | ICD-10-CM | POA: Diagnosis not present

## 2018-08-06 DIAGNOSIS — G7000 Myasthenia gravis without (acute) exacerbation: Secondary | ICD-10-CM | POA: Diagnosis not present

## 2018-08-06 DIAGNOSIS — E1165 Type 2 diabetes mellitus with hyperglycemia: Secondary | ICD-10-CM

## 2018-08-06 DIAGNOSIS — E1140 Type 2 diabetes mellitus with diabetic neuropathy, unspecified: Secondary | ICD-10-CM | POA: Diagnosis not present

## 2018-08-06 DIAGNOSIS — E118 Type 2 diabetes mellitus with unspecified complications: Secondary | ICD-10-CM | POA: Diagnosis not present

## 2018-08-06 DIAGNOSIS — E039 Hypothyroidism, unspecified: Secondary | ICD-10-CM | POA: Diagnosis not present
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
## 2018-08-08 DIAGNOSIS — E1140 Type 2 diabetes mellitus with diabetic neuropathy, unspecified: Secondary | ICD-10-CM | POA: Diagnosis not present

## 2018-08-10 DIAGNOSIS — Z79899 Other long term (current) drug therapy: Secondary | ICD-10-CM | POA: Diagnosis not present

## 2018-08-14 ENCOUNTER — Encounter: Payer: Self-pay | Admitting: Internal Medicine

## 2018-08-14 ENCOUNTER — Other Ambulatory Visit: Payer: Self-pay | Admitting: Internal Medicine

## 2018-08-16 MED ORDER — GLUCERNA SHAKE PO LIQD: 237.0000 mL | Freq: Three times a day (TID) | ORAL | 5 refills | Status: DC

## 2018-08-19 ENCOUNTER — Other Ambulatory Visit: Payer: Self-pay

## 2018-08-19 ENCOUNTER — Ambulatory Visit: Payer: Medicare Other | Attending: Internal Medicine | Admitting: Physical Therapy

## 2018-08-19 DIAGNOSIS — M5441 Lumbago with sciatica, right side: Secondary | ICD-10-CM | POA: Diagnosis not present

## 2018-08-19 DIAGNOSIS — M5442 Lumbago with sciatica, left side: Secondary | ICD-10-CM | POA: Insufficient documentation

## 2018-08-19 DIAGNOSIS — R42 Dizziness and giddiness: Secondary | ICD-10-CM

## 2018-08-19 DIAGNOSIS — M542 Cervicalgia: Secondary | ICD-10-CM
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
## 2018-08-21 DIAGNOSIS — G43709 Chronic migraine without aura, not intractable, without status migrainosus: Secondary | ICD-10-CM | POA: Diagnosis not present

## 2018-08-21 DIAGNOSIS — G7000 Myasthenia gravis without (acute) exacerbation: Secondary | ICD-10-CM | POA: Diagnosis not present

## 2018-08-22 DIAGNOSIS — E118 Type 2 diabetes mellitus with unspecified complications: Secondary | ICD-10-CM | POA: Diagnosis not present

## 2018-08-22 DIAGNOSIS — Z9884 Bariatric surgery status: Secondary | ICD-10-CM | POA: Diagnosis not present

## 2018-08-22 DIAGNOSIS — E039 Hypothyroidism, unspecified: Secondary | ICD-10-CM | POA: Diagnosis not present

## 2018-08-22 DIAGNOSIS — E1140 Type 2 diabetes mellitus with diabetic neuropathy, unspecified: Secondary | ICD-10-CM | POA: Diagnosis not present

## 2018-08-26 ENCOUNTER — Other Ambulatory Visit: Payer: Self-pay | Admitting: Family Medicine

## 2018-08-26 ENCOUNTER — Other Ambulatory Visit: Payer: Self-pay | Admitting: Internal Medicine

## 2018-08-26 ENCOUNTER — Ambulatory Visit: Payer: Medicare Other | Admitting: Physical Therapy

## 2018-08-28 ENCOUNTER — Other Ambulatory Visit: Payer: Self-pay

## 2018-08-28 ENCOUNTER — Ambulatory Visit: Payer: Medicare Other | Admitting: Physical Therapy

## 2018-08-28 DIAGNOSIS — M542 Cervicalgia: Secondary | ICD-10-CM

## 2018-08-28 DIAGNOSIS — R42 Dizziness and giddiness: Secondary | ICD-10-CM

## 2018-08-28 DIAGNOSIS — M5442 Lumbago with sciatica, left side: Secondary | ICD-10-CM | POA: Diagnosis not present

## 2018-08-28 DIAGNOSIS — M5441 Lumbago with sciatica, right side: Secondary | ICD-10-CM
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
## 2018-08-30 ENCOUNTER — Other Ambulatory Visit: Payer: Self-pay

## 2018-08-30 ENCOUNTER — Telehealth (INDEPENDENT_AMBULATORY_CARE_PROVIDER_SITE_OTHER): Payer: Medicare Other | Admitting: Neurology

## 2018-08-30 VITALS — Ht 66.0 in | Wt 175.0 lb

## 2018-08-30 DIAGNOSIS — G7000 Myasthenia gravis without (acute) exacerbation: Secondary | ICD-10-CM | POA: Diagnosis not present

## 2018-08-30 NOTE — Progress Notes (Signed)
    Virtual Visit via Telephone Note The purpose of this virtual visit is to provide medical care while limiting exposure to the novel coronavirus.    Consent was obtained for phone visit:  Yes.   Answered questions that patient had about telehealth interaction:  Yes.   I discussed the limitations, risks, security and privacy concerns of performing an evaluation and management service by telephone. I also discussed with the patient that there may be a patient responsible charge related to this service. The patient expressed understanding and agreed to proceed.  Pt location: Home Physician Location: office Name of referring provider:  Hoyt Koch, * I connected with .Jade Mathis at patients initiation/request on 08/30/2018 at  2:30 PM EDT by telephone and verified that I am speaking with the correct person using two identifiers.  Pt MRN:  [id number] Pt DOB:  1975-08-17   History of Present Illness: This is a 43 year-old female returning for evaluation of myasthenia gravis.  She continues to have generalized fatigue and reports blurred vision when her headaches are severe.  Otherwise, no problems with swallowing/talking or limb weakness.  She is compliant with prednisone 10mg , azathrioprine 150mg , and mestinon 60mg  TID.  She is seeing Sharlet Salina, PA at [facility] for chronic headaches and started on Aimovig 140mg  injections.    She will continue her current regimen of prednisone 10mg /d, azathrioprine 150mg /d, and mestinon 60mg  TID.   Follow Up Instructions:   I discussed the assessment and treatment plan with the patient. The patient was provided an opportunity to ask questions and all were answered. The patient agreed with the plan and demonstrated an understanding of the instructions.   The patient was advised to call back or seek an in-person evaluation if the symptoms worsen or if the condition fails to improve as anticipated.  Return to clinic in 6 months    Total Time spent in visit with the patient was:  15 min, of which 100% of the time was spent in counseling and/or coordinating care.   Pt understands and agrees with the plan of care outlined.     Alda Berthold, DO

## 2018-09-02 ENCOUNTER — Other Ambulatory Visit: Payer: Self-pay

## 2018-09-02 ENCOUNTER — Ambulatory Visit: Payer: Medicare Other | Admitting: Physical Therapy

## 2018-09-02 DIAGNOSIS — R42 Dizziness and giddiness: Secondary | ICD-10-CM

## 2018-09-02 DIAGNOSIS — M5442 Lumbago with sciatica, left side: Secondary | ICD-10-CM | POA: Diagnosis not present

## 2018-09-02 DIAGNOSIS — M5441 Lumbago with sciatica, right side: Secondary | ICD-10-CM

## 2018-09-02 DIAGNOSIS — M542 Cervicalgia: Secondary | ICD-10-CM | POA: Diagnosis not present
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
## 2018-09-04 ENCOUNTER — Ambulatory Visit: Payer: Medicare Other | Admitting: Physical Therapy

## 2018-09-04 ENCOUNTER — Other Ambulatory Visit: Payer: Self-pay

## 2018-09-04 DIAGNOSIS — M5441 Lumbago with sciatica, right side: Secondary | ICD-10-CM

## 2018-09-04 DIAGNOSIS — R42 Dizziness and giddiness: Secondary | ICD-10-CM | POA: Diagnosis not present

## 2018-09-04 DIAGNOSIS — M5442 Lumbago with sciatica, left side: Secondary | ICD-10-CM | POA: Diagnosis not present

## 2018-09-04 DIAGNOSIS — M542 Cervicalgia: Secondary | ICD-10-CM

## 2018-09-05 DIAGNOSIS — M5431 Sciatica, right side: Secondary | ICD-10-CM | POA: Diagnosis not present

## 2018-09-05 DIAGNOSIS — Z79899 Other long term (current) drug therapy: Secondary | ICD-10-CM | POA: Diagnosis not present

## 2018-09-05 DIAGNOSIS — M542 Cervicalgia: Secondary | ICD-10-CM | POA: Diagnosis not present

## 2018-09-05 DIAGNOSIS — Z79891 Long term (current) use of opiate analgesic: Secondary | ICD-10-CM | POA: Diagnosis not present
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
## 2018-09-09 ENCOUNTER — Ambulatory Visit: Payer: Medicare Other | Admitting: Physical Therapy

## 2018-09-09 ENCOUNTER — Other Ambulatory Visit: Payer: Self-pay

## 2018-09-09 DIAGNOSIS — M5441 Lumbago with sciatica, right side: Secondary | ICD-10-CM

## 2018-09-09 DIAGNOSIS — R42 Dizziness and giddiness: Secondary | ICD-10-CM

## 2018-09-09 DIAGNOSIS — M542 Cervicalgia: Secondary | ICD-10-CM
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
## 2018-09-11 ENCOUNTER — Telehealth: Payer: Self-pay | Admitting: Physical Therapy

## 2018-09-11 ENCOUNTER — Ambulatory Visit: Payer: Medicare Other | Admitting: Physical Therapy

## 2018-09-11 NOTE — Telephone Encounter (Signed)
Called patient to inform her we are going to discharge her from PT and refer back to MD. She has been to PT for 6 months now for 25 visits and has not made any recent progress. PT is not able to progress her and she does not tolerate therapy. She agrees to this decision and was recommended to follow back up with her MD regarding her continued widespread pain.  Elsie Ra, PT, DPT 09/11/18 4:21 PM

## 2018-09-12 DIAGNOSIS — E1140 Type 2 diabetes mellitus with diabetic neuropathy, unspecified: Secondary | ICD-10-CM | POA: Diagnosis not present

## 2018-09-13 ENCOUNTER — Encounter: Payer: Self-pay | Admitting: Internal Medicine

## 2018-09-16 ENCOUNTER — Ambulatory Visit: Payer: Medicare Other | Admitting: Physical Therapy

## 2018-09-18 ENCOUNTER — Ambulatory Visit: Payer: Medicare Other | Admitting: Physical Therapy

## 2018-09-20 ENCOUNTER — Other Ambulatory Visit: Payer: Self-pay | Admitting: Obstetrics & Gynecology

## 2018-09-20 DIAGNOSIS — O0992 Supervision of high risk pregnancy, unspecified, second trimester: Secondary | ICD-10-CM

## 2018-09-23 ENCOUNTER — Ambulatory Visit: Payer: Medicare Other | Admitting: Physical Therapy

## 2018-09-23 DIAGNOSIS — R269 Unspecified abnormalities of gait and mobility: Secondary | ICD-10-CM | POA: Diagnosis not present

## 2018-09-23 DIAGNOSIS — M79609 Pain in unspecified limb: Secondary | ICD-10-CM | POA: Diagnosis not present

## 2018-09-23 DIAGNOSIS — G4733 Obstructive sleep apnea (adult) (pediatric): Secondary | ICD-10-CM | POA: Diagnosis not present

## 2018-09-25 ENCOUNTER — Ambulatory Visit: Payer: Medicare Other | Admitting: Physical Therapy

## 2018-09-30 ENCOUNTER — Other Ambulatory Visit: Payer: Self-pay | Admitting: Endocrinology

## 2018-09-30 ENCOUNTER — Ambulatory Visit: Payer: Medicare Other | Admitting: Physical Therapy

## 2018-10-01 DIAGNOSIS — E119 Type 2 diabetes mellitus without complications: Secondary | ICD-10-CM | POA: Diagnosis not present

## 2018-10-01 DIAGNOSIS — Z903 Acquired absence of stomach [part of]: Secondary | ICD-10-CM | POA: Diagnosis not present

## 2018-10-01 DIAGNOSIS — M064 Inflammatory polyarthropathy: Secondary | ICD-10-CM | POA: Diagnosis not present

## 2018-10-01 DIAGNOSIS — Z9225 Personal history of immunosupression therapy: Secondary | ICD-10-CM | POA: Diagnosis not present

## 2018-10-01 DIAGNOSIS — G7000 Myasthenia gravis without (acute) exacerbation: Secondary | ICD-10-CM | POA: Diagnosis not present

## 2018-10-02 ENCOUNTER — Ambulatory Visit: Payer: Medicare Other | Admitting: Physical Therapy

## 2018-10-02 DIAGNOSIS — E1140 Type 2 diabetes mellitus with diabetic neuropathy, unspecified: Secondary | ICD-10-CM | POA: Diagnosis not present

## 2018-10-02 DIAGNOSIS — Z9884 Bariatric surgery status: Secondary | ICD-10-CM | POA: Diagnosis not present

## 2018-10-02 DIAGNOSIS — E118 Type 2 diabetes mellitus with unspecified complications: Secondary | ICD-10-CM | POA: Diagnosis not present

## 2018-10-02 DIAGNOSIS — E039 Hypothyroidism, unspecified: Secondary | ICD-10-CM | POA: Diagnosis not present

## 2018-10-03 DIAGNOSIS — M5431 Sciatica, right side: Secondary | ICD-10-CM | POA: Diagnosis not present

## 2018-10-03 DIAGNOSIS — Z79891 Long term (current) use of opiate analgesic: Secondary | ICD-10-CM | POA: Diagnosis not present

## 2018-10-03 DIAGNOSIS — M542 Cervicalgia: Secondary | ICD-10-CM | POA: Diagnosis not present

## 2018-10-03 DIAGNOSIS — Z9189 Other specified personal risk factors, not elsewhere classified: Secondary | ICD-10-CM | POA: Diagnosis not present

## 2018-10-04 DIAGNOSIS — Z79899 Other long term (current) drug therapy: Secondary | ICD-10-CM | POA: Diagnosis not present

## 2018-10-07 ENCOUNTER — Ambulatory Visit: Payer: Medicaid Other | Admitting: Podiatry

## 2018-10-07 ENCOUNTER — Ambulatory Visit: Payer: Medicare Other | Admitting: Physical Therapy

## 2018-10-09 ENCOUNTER — Ambulatory Visit: Payer: Medicare Other | Admitting: Physical Therapy

## 2018-10-14 ENCOUNTER — Ambulatory Visit: Payer: Medicare Other | Admitting: Physical Therapy

## 2018-10-16 ENCOUNTER — Ambulatory Visit: Payer: Medicare Other | Admitting: Physical Therapy

## 2018-10-17 DIAGNOSIS — G7000 Myasthenia gravis without (acute) exacerbation: Secondary | ICD-10-CM | POA: Diagnosis not present

## 2018-10-17 DIAGNOSIS — Z79899 Other long term (current) drug therapy: Secondary | ICD-10-CM | POA: Diagnosis not present

## 2018-10-17 DIAGNOSIS — E118 Type 2 diabetes mellitus with unspecified complications: Secondary | ICD-10-CM | POA: Diagnosis not present

## 2018-10-17 DIAGNOSIS — E039 Hypothyroidism, unspecified: Secondary | ICD-10-CM | POA: Diagnosis not present

## 2018-10-17 DIAGNOSIS — E58 Dietary calcium deficiency: Secondary | ICD-10-CM | POA: Diagnosis not present

## 2018-10-17 LAB — HEPATIC FUNCTION PANEL
ALT: 7 (ref 7–35)
AST: 10 — AB (ref 13–35)
Alkaline Phosphatase: 91 (ref 25–125)
Bilirubin, Total: 0.4

## 2018-10-17 LAB — CBC AND DIFFERENTIAL
HCT: 39 (ref 36–46)
Hemoglobin: 12.5 (ref 12.0–16.0)
Platelets: 249 (ref 150–399)
WBC: 5.9

## 2018-10-17 LAB — BASIC METABOLIC PANEL
BUN: 19 (ref 4–21)
Creatinine: 0.7 (ref 0.5–1.1)
Glucose: 111
Potassium: 3.9 (ref 3.4–5.3)
Sodium: 141 (ref 137–147)

## 2018-10-17 LAB — VITAMIN B12: Vitamin B-12: 1341

## 2018-10-17 LAB — TSH: TSH: 1.2 (ref 0.41–5.90)

## 2018-10-18 ENCOUNTER — Telehealth: Payer: Self-pay | Admitting: Internal Medicine

## 2018-10-18 NOTE — Telephone Encounter (Signed)
Can you schedule patient a virtual to discuss meds needs to have all her meds so we can go through which ones she is taking. Thank you

## 2018-10-18 NOTE — Telephone Encounter (Signed)
error 

## 2018-10-18 NOTE — Telephone Encounter (Signed)
Has multiple different NSAIDs on her list so would need visit to clarify what she is taking. She can do virtual. Must have a current med list at the time of that visit (whether virtual or in person).

## 2018-10-18 NOTE — Telephone Encounter (Signed)
Patient requesting Advair and Ibuprofen 800 to be sent to [facility] on [facility].

## 2018-10-18 NOTE — Telephone Encounter (Signed)
Okay to send in 

## 2018-10-21 NOTE — Telephone Encounter (Signed)
Left pt vm to call back to schedule. 

## 2018-10-22 DIAGNOSIS — M79609 Pain in unspecified limb: Secondary | ICD-10-CM | POA: Diagnosis not present

## 2018-10-22 DIAGNOSIS — G4733 Obstructive sleep apnea (adult) (pediatric): Secondary | ICD-10-CM | POA: Diagnosis not present

## 2018-10-22 DIAGNOSIS — R269 Unspecified abnormalities of gait and mobility: Secondary | ICD-10-CM | POA: Diagnosis not present

## 2018-10-23 DIAGNOSIS — E039 Hypothyroidism, unspecified: Secondary | ICD-10-CM | POA: Diagnosis not present

## 2018-10-23 DIAGNOSIS — E1140 Type 2 diabetes mellitus with diabetic neuropathy, unspecified: Secondary | ICD-10-CM | POA: Diagnosis not present

## 2018-10-23 DIAGNOSIS — Z9884 Bariatric surgery status: Secondary | ICD-10-CM | POA: Diagnosis not present

## 2018-10-23 DIAGNOSIS — E118 Type 2 diabetes mellitus with unspecified complications: Secondary | ICD-10-CM | POA: Diagnosis not present

## 2018-10-24 ENCOUNTER — Encounter: Payer: Self-pay | Admitting: Internal Medicine

## 2018-10-24 NOTE — Progress Notes (Signed)
Abstracted and sent to scan  

## 2018-10-30 DIAGNOSIS — E1140 Type 2 diabetes mellitus with diabetic neuropathy, unspecified: Secondary | ICD-10-CM | POA: Diagnosis not present

## 2018-10-31 DIAGNOSIS — E119 Type 2 diabetes mellitus without complications: Secondary | ICD-10-CM | POA: Diagnosis not present

## 2018-10-31 DIAGNOSIS — M5431 Sciatica, right side: Secondary | ICD-10-CM | POA: Diagnosis not present

## 2018-10-31 DIAGNOSIS — Z79899 Other long term (current) drug therapy: Secondary | ICD-10-CM | POA: Diagnosis not present

## 2018-10-31 DIAGNOSIS — Z79891 Long term (current) use of opiate analgesic: Secondary | ICD-10-CM | POA: Diagnosis not present

## 2018-11-11 ENCOUNTER — Ambulatory Visit: Payer: Medicare Other | Admitting: Internal Medicine

## 2018-11-19 DIAGNOSIS — E1140 Type 2 diabetes mellitus with diabetic neuropathy, unspecified: Secondary | ICD-10-CM | POA: Diagnosis not present

## 2018-11-20 DIAGNOSIS — M79609 Pain in unspecified limb: Secondary | ICD-10-CM | POA: Diagnosis not present

## 2018-11-20 DIAGNOSIS — G4733 Obstructive sleep apnea (adult) (pediatric): Secondary | ICD-10-CM | POA: Diagnosis not present

## 2018-11-20 DIAGNOSIS — R269 Unspecified abnormalities of gait and mobility: Secondary | ICD-10-CM | POA: Diagnosis not present

## 2018-11-27 ENCOUNTER — Other Ambulatory Visit: Payer: Self-pay | Admitting: Internal Medicine

## 2018-11-28 DIAGNOSIS — Z79899 Other long term (current) drug therapy: Secondary | ICD-10-CM | POA: Diagnosis not present

## 2018-11-28 DIAGNOSIS — Z79891 Long term (current) use of opiate analgesic: Secondary | ICD-10-CM | POA: Diagnosis not present

## 2018-11-28 DIAGNOSIS — M5431 Sciatica, right side: Secondary | ICD-10-CM | POA: Diagnosis not present

## 2018-12-12 DIAGNOSIS — G43709 Chronic migraine without aura, not intractable, without status migrainosus: Secondary | ICD-10-CM | POA: Diagnosis not present

## 2018-12-19 DIAGNOSIS — G4733 Obstructive sleep apnea (adult) (pediatric): Secondary | ICD-10-CM | POA: Diagnosis not present

## 2018-12-19 DIAGNOSIS — R269 Unspecified abnormalities of gait and mobility: Secondary | ICD-10-CM | POA: Diagnosis not present

## 2018-12-19 DIAGNOSIS — M79609 Pain in unspecified limb: Secondary | ICD-10-CM | POA: Diagnosis not present

## 2018-12-27 ENCOUNTER — Other Ambulatory Visit: Payer: Self-pay | Admitting: Internal Medicine

## 2018-12-27 DIAGNOSIS — Z79899 Other long term (current) drug therapy: Secondary | ICD-10-CM | POA: Diagnosis not present

## 2018-12-27 DIAGNOSIS — Z79891 Long term (current) use of opiate analgesic: Secondary | ICD-10-CM | POA: Diagnosis not present

## 2018-12-27 DIAGNOSIS — M5431 Sciatica, right side: Secondary | ICD-10-CM | POA: Diagnosis not present

## 2018-12-31 ENCOUNTER — Other Ambulatory Visit: Payer: Self-pay | Admitting: Podiatry

## 2018-12-31 ENCOUNTER — Ambulatory Visit (INDEPENDENT_AMBULATORY_CARE_PROVIDER_SITE_OTHER): Payer: Medicare Other | Admitting: Podiatry

## 2018-12-31 ENCOUNTER — Other Ambulatory Visit: Payer: Self-pay

## 2018-12-31 ENCOUNTER — Ambulatory Visit (INDEPENDENT_AMBULATORY_CARE_PROVIDER_SITE_OTHER): Payer: Medicare Other

## 2018-12-31 DIAGNOSIS — M7752 Other enthesopathy of left foot: Secondary | ICD-10-CM

## 2018-12-31 DIAGNOSIS — M79671 Pain in right foot: Secondary | ICD-10-CM

## 2018-12-31 DIAGNOSIS — M79672 Pain in left foot: Secondary | ICD-10-CM

## 2018-12-31 DIAGNOSIS — M7751 Other enthesopathy of right foot: Secondary | ICD-10-CM

## 2018-12-31 DIAGNOSIS — E0842 Diabetes mellitus due to underlying condition with diabetic polyneuropathy: Secondary | ICD-10-CM | POA: Diagnosis not present

## 2018-12-31 DIAGNOSIS — M779 Enthesopathy, unspecified: Secondary | ICD-10-CM

## 2018-12-31 DIAGNOSIS — M79674 Pain in right toe(s): Secondary | ICD-10-CM | POA: Diagnosis not present

## 2018-12-31 DIAGNOSIS — B351 Tinea unguium: Secondary | ICD-10-CM

## 2018-12-31 DIAGNOSIS — M79675 Pain in left toe(s): Secondary | ICD-10-CM

## 2018-12-31 NOTE — Patient Instructions (Signed)

## 2019-01-01 ENCOUNTER — Encounter: Payer: Self-pay | Admitting: Podiatry

## 2019-01-01 NOTE — Progress Notes (Signed)
  Subjective:  Patient ID: Jade Mathis, female    DOB: Aug 03, 1975,  MRN: [id number]  Chief Complaint  Patient presents with  . Nail Problem    pt is here for numbness in the right bottom of heel, as well as an ingrown in the left big toenail, and left second toenail   43 y.o. female returns for the above complaint.  Patient states that the toenails have been painful.  They also come back very thicker.  She is also interested in obtaining diabetic shoes.  She has difficult time debriding her own painful elongated thickened toenails.  Denies any acute changes since last visit.  Objective:  There were no vitals filed for this visit. Podiatric Exam: Vascular: dorsalis pedis and posterior tibial pulses are palpable bilateral. Capillary return is immediate. Temperature gradient is WNL. Skin turgor WNL  Sensorium: Normal Semmes Weinstein monofilament test. Normal tactile sensation bilaterally. Nail Exam: Pt has thick disfigured discolored nails with subungual debris noted bilateral entire nail hallux through fifth toenails Ulcer Exam: There is no evidence of ulcer or pre-ulcerative changes or infection. Orthopedic Exam: Muscle tone and strength are WNL. No limitations in general ROM. No crepitus or effusions noted. HAV  B/L.  Hammer toes 2-5  B/L. Skin: No Porokeratosis. No infection or ulcers  Assessment & Plan:  Patient was evaluated and treated and all questions answered.  Onychomycosis with pain  -Nails palliatively debrided as below. -Educated on self-care -The patient to follow-up with the rick for diabetic insoles.  She states that she will make the appointment with him soon as possible..  Procedure: Nail Debridement Rationale: pain  Type of Debridement: manual, sharp debridement. Instrumentation: Nail nipper, rotary burr. Number of Nails: 10  Procedures and Treatment: Consent by patient was obtained for treatment procedures. The patient understood the discussion of treatment  and procedures well. All questions were answered thoroughly reviewed. Debridement of mycotic and hypertrophic toenails, 1 through 5 bilateral and clearing of subungual debris. No ulceration, no infection noted.  Return Visit-Office Procedure: Patient instructed to return to the office for a follow up visit 3 months for continued evaluation and treatment.  Boneta Lucks, DPM    No follow-ups on file.

## 2019-01-06 ENCOUNTER — Encounter: Payer: Self-pay | Admitting: Internal Medicine

## 2019-01-07 ENCOUNTER — Encounter: Payer: Self-pay | Admitting: Internal Medicine

## 2019-01-07 ENCOUNTER — Telehealth: Payer: Self-pay

## 2019-01-07 ENCOUNTER — Ambulatory Visit (INDEPENDENT_AMBULATORY_CARE_PROVIDER_SITE_OTHER): Payer: Medicare Other | Admitting: Internal Medicine

## 2019-01-07 DIAGNOSIS — J4530 Mild persistent asthma, uncomplicated: Secondary | ICD-10-CM

## 2019-01-07 DIAGNOSIS — E669 Obesity, unspecified: Secondary | ICD-10-CM

## 2019-01-07 DIAGNOSIS — E1169 Type 2 diabetes mellitus with other specified complication: Secondary | ICD-10-CM

## 2019-01-07 MED ORDER — TRAZODONE HCL 100 MG PO TABS: 100.0000 mg | ORAL_TABLET | Freq: Every day | ORAL | 3 refills | Status: DC

## 2019-01-07 MED ORDER — VALACYCLOVIR HCL 500 MG PO TABS: 500.0000 mg | ORAL_TABLET | Freq: Two times a day (BID) | ORAL | 1 refills | Status: DC

## 2019-01-07 MED ORDER — FLUTICASONE-SALMETEROL 100-50 MCG/DOSE IN AEPB: 1.0000 | INHALATION_SPRAY | Freq: Two times a day (BID) | RESPIRATORY_TRACT | 3 refills | Status: DC

## 2019-01-07 NOTE — Telephone Encounter (Signed)
Noted  

## 2019-01-07 NOTE — Telephone Encounter (Signed)
Patient needs a letter due to diabetes Not bartric procedure or anything else but the Diabetes. If she does not eat she get lightheaded, shakes and confused.

## 2019-01-07 NOTE — Telephone Encounter (Signed)
Has visit today can discuss then

## 2019-01-07 NOTE — Progress Notes (Signed)
Virtual Visit via Audio Note  I connected with Jade Mathis on 01/07/19 at  4:00 PM EDT by an audio-only enabled telemedicine application and verified that I am speaking with the correct person using two identifiers.  The patient and the provider were at separate locations throughout the entire encounter.   I discussed the limitations of evaluation and management by telemedicine and the availability of in person appointments. The patient expressed understanding and agreed to proceed. The patient and the provider were the only parties present for the visit unless noted in HPI below.  History of Present Illness: The patient is a 43 y.o. female with visit for needing note for work/school. She needs to be able to keep food/liquids with her at all times. She feels that this is due to her diabetes. She has been having some low sugars recently and has talked to her endocrine provider about this and they have given her advice. She has recently had bariatric surgery which makes her require small frequent meals due to stomach size.  Observations/Objective: Patient did not see why she had to turn on camera so audio only, voice strong, no coughing or dyspnea during visit, A and O times 3  Assessment and Plan: See problem oriented charting  Follow Up Instructions: Work note given to indicate she needs access to food/drink.   Visit time 13 minutes: that time was spent in non-face to face counseling and coordination of care with the patient: counseled about as above  I discussed the assessment and treatment plan with the patient. The patient was provided an opportunity to ask questions and all were answered. The patient agreed with the plan and demonstrated an understanding of the instructions.   The patient was advised to call back or seek an in-person evaluation if the symptoms worsen or if the condition fails to improve as anticipated.  Hoyt Koch, MD

## 2019-01-07 NOTE — Telephone Encounter (Signed)
Copied from [place] [phone]. Topic: General - Other >> Jan 07, 2019  9:50 AM Antonieta Iba C wrote: Reason for CRM: pt called in requesting a call back from PCP's CMA, the call back is in regards to a letter that was prepared for her.

## 2019-01-08 ENCOUNTER — Encounter: Payer: Self-pay | Admitting: Internal Medicine

## 2019-01-09 NOTE — Assessment & Plan Note (Signed)
Rx advair to replace flovent as she does better with this. We had to switch this during pregnancy previously.

## 2019-01-09 NOTE — Telephone Encounter (Signed)
Copied from [place] [phone]. Topic: General - Inquiry >> Jan 08, 2019  9:59 AM Mathis Bud wrote: Reason for CRM: Patient sent mychart message to PCP regarding her letter she needed yesterday to add her diabetes and what she can eat/drink.  Patient would like to come pick up letter, to call her when it is ready. Call back [phone]

## 2019-01-09 NOTE — Assessment & Plan Note (Signed)
She will continue seeing endocrinology for her diabetes.

## 2019-01-13 ENCOUNTER — Other Ambulatory Visit: Payer: Self-pay

## 2019-01-13 ENCOUNTER — Ambulatory Visit: Payer: Medicare Other | Admitting: Orthotics

## 2019-01-13 DIAGNOSIS — M79671 Pain in right foot: Secondary | ICD-10-CM

## 2019-01-13 DIAGNOSIS — M79672 Pain in left foot: Secondary | ICD-10-CM

## 2019-01-13 DIAGNOSIS — E0842 Diabetes mellitus due to underlying condition with diabetic polyneuropathy: Secondary | ICD-10-CM

## 2019-01-13 NOTE — Progress Notes (Signed)

## 2019-01-20 ENCOUNTER — Encounter: Payer: Self-pay | Admitting: Internal Medicine

## 2019-01-20 DIAGNOSIS — Z713 Dietary counseling and surveillance: Secondary | ICD-10-CM | POA: Diagnosis not present

## 2019-01-20 DIAGNOSIS — R269 Unspecified abnormalities of gait and mobility: Secondary | ICD-10-CM | POA: Diagnosis not present

## 2019-01-20 DIAGNOSIS — G4733 Obstructive sleep apnea (adult) (pediatric): Secondary | ICD-10-CM | POA: Diagnosis not present

## 2019-01-20 DIAGNOSIS — M79609 Pain in unspecified limb: Secondary | ICD-10-CM | POA: Diagnosis not present

## 2019-01-22 ENCOUNTER — Ambulatory Visit (INDEPENDENT_AMBULATORY_CARE_PROVIDER_SITE_OTHER): Payer: Medicare Other | Admitting: Internal Medicine

## 2019-01-22 ENCOUNTER — Encounter: Payer: Self-pay | Admitting: Internal Medicine

## 2019-01-22 ENCOUNTER — Other Ambulatory Visit: Payer: Self-pay

## 2019-01-22 VITALS — BP 102/60 | HR 85 | Temp 98.2°F | Resp 16 | Ht 66.0 in | Wt 164.0 lb

## 2019-01-22 DIAGNOSIS — F419 Anxiety disorder, unspecified: Secondary | ICD-10-CM

## 2019-01-22 DIAGNOSIS — G43909 Migraine, unspecified, not intractable, without status migrainosus: Secondary | ICD-10-CM

## 2019-01-22 DIAGNOSIS — G7000 Myasthenia gravis without (acute) exacerbation: Secondary | ICD-10-CM | POA: Diagnosis not present

## 2019-01-22 MED ORDER — KETOROLAC TROMETHAMINE 30 MG/ML IJ SOLN
30.0000 mg | Freq: Once | INTRAMUSCULAR | Status: AC
  Administered 2019-01-22: 30 mg via INTRAMUSCULAR

## 2019-01-22 NOTE — Assessment & Plan Note (Signed)
Following with Dr. Posey Pronto She feels she is having a flare of myasthenia gravis-I am not convinced she is Advised follow-up with Dr. Posey Pronto I do think she is having increased stress,?  Fibromyalgia flare

## 2019-01-22 NOTE — Assessment & Plan Note (Signed)
She is having increased anxiety-related to her work situation That is increasing her chronic pain and headaches She does follow with psychiatry and did have a virtual visit with him yesterday Advised continued follow-up with psychiatry
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
## 2019-01-22 NOTE — Assessment & Plan Note (Signed)
Chronic headaches Following with [facility] headache clinic Headaches recently worse than usual Taking Topamax, Imitrex Headaches are worse likely related to stress Will give Toradol 30 mg IM x1 today Needs to follow-up with headache clinic if headaches do not improve

## 2019-01-22 NOTE — Patient Instructions (Signed)
Contact your neurologist and psychiatrist.     A Toradol injection was given.    A note was given for work.

## 2019-01-23 ENCOUNTER — Encounter: Payer: Self-pay | Admitting: Internal Medicine

## 2019-01-23 ENCOUNTER — Telehealth: Payer: Self-pay

## 2019-01-23 ENCOUNTER — Other Ambulatory Visit: Payer: Self-pay | Admitting: Internal Medicine

## 2019-01-23 NOTE — Telephone Encounter (Signed)
Copied from [place] [id number]. Topic: General - Inquiry >> Jan 23, 2019  4:20 PM Mathis Bud wrote: Reason for CRM: Patient states she needs Dr.crawford needs to respond to her message on mychart, not Dr.Burns.  Patient states Dr.Burns cannot do anything else further, due to Dr.burns does not know her. Call back [phone]

## 2019-01-23 NOTE — Telephone Encounter (Signed)
I cannot give her time off without seeing patient. I would have to defer to the doctor who saw her for whatever reason she needs to be off.

## 2019-01-24 ENCOUNTER — Other Ambulatory Visit: Payer: Self-pay

## 2019-01-24 ENCOUNTER — Encounter: Payer: Self-pay | Admitting: Internal Medicine

## 2019-01-24 ENCOUNTER — Ambulatory Visit (INDEPENDENT_AMBULATORY_CARE_PROVIDER_SITE_OTHER): Payer: Medicare Other | Admitting: Internal Medicine

## 2019-01-24 VITALS — BP 90/70 | HR 73 | Temp 98.5°F | Ht 66.0 in | Wt 164.0 lb

## 2019-01-24 DIAGNOSIS — Z79899 Other long term (current) drug therapy: Secondary | ICD-10-CM | POA: Diagnosis not present

## 2019-01-24 DIAGNOSIS — G43909 Migraine, unspecified, not intractable, without status migrainosus: Secondary | ICD-10-CM

## 2019-01-24 DIAGNOSIS — Z79891 Long term (current) use of opiate analgesic: Secondary | ICD-10-CM | POA: Diagnosis not present

## 2019-01-24 DIAGNOSIS — G7000 Myasthenia gravis without (acute) exacerbation: Secondary | ICD-10-CM | POA: Diagnosis not present

## 2019-01-24 DIAGNOSIS — M5431 Sciatica, right side: Secondary | ICD-10-CM | POA: Diagnosis not present

## 2019-01-24 DIAGNOSIS — Z7189 Other specified counseling: Secondary | ICD-10-CM | POA: Diagnosis not present

## 2019-01-24 MED ORDER — ACCU-CHEK AVIVA PLUS W/DEVICE KIT: PACK | 1 refills | Status: AC

## 2019-01-24 MED ORDER — KETOROLAC TROMETHAMINE 30 MG/ML IJ SOLN
30.0000 mg | Freq: Once | INTRAMUSCULAR | Status: AC
  Administered 2019-01-24: 30 mg via INTRAMUSCULAR

## 2019-01-24 NOTE — Patient Instructions (Signed)
Please remember every time you come in to bring a list of your medications and the doses with you as it is very important that we know this so we can help you.  We have given you a shot to help with headache and will keep you out of work until the 16th.

## 2019-01-24 NOTE — Assessment & Plan Note (Signed)
Not clearly flare but advised to call her neurologist again to see if they can get her in sooner as she has visit but not until mid December.

## 2019-01-24 NOTE — Progress Notes (Signed)
   Subjective:   Patient ID: Jade Mathis, female    DOB: 02/11/1976, 43 y.o.   MRN: [id number]  HPI The patient is a 43 YO female coming in for concerns about headache and weakness and stress. She was seen a few days ago and given toradol for headache. This did help temporarily. 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 This is not resolved yet. She is still having headaches daily. Did have recent fall but no LOC. Denies vision changes. The weakness is general and more in legs and arms. Denies SOB or problems with swallowing or handling secretions.   Review of Systems  Constitutional: Positive for activity change and appetite change.  HENT: Negative.   Eyes: Negative.   Respiratory: Negative for cough, chest tightness and shortness of breath.   Cardiovascular: Negative for chest pain, palpitations and leg swelling.  Gastrointestinal: Positive for nausea. Negative for abdominal distention, abdominal pain, constipation, diarrhea and vomiting.  Musculoskeletal: Negative.   Skin: Negative.   Neurological: Positive for weakness and headaches.  Psychiatric/Behavioral: Negative.     Objective:  Physical Exam Constitutional:      Appearance: She is well-developed.  HENT:     Head: Normocephalic and atraumatic.  Neck:     Musculoskeletal: Normal range of motion.  Cardiovascular:     Rate and Rhythm: Normal rate and regular rhythm.  Pulmonary:     Effort: Pulmonary effort is normal. No respiratory distress.     Breath sounds: Normal breath sounds. No wheezing or rales.  Abdominal:     General: Bowel sounds are normal. There is no distension.     Palpations: Abdomen is soft.     Tenderness: There is abdominal tenderness. There is no rebound.  Musculoskeletal:        General: Tenderness present.  Skin:    General: Skin is warm and dry.  Neurological:     Mental Status: She is alert and oriented to person, place, and time.  Mental status is at baseline.     Motor: Weakness present.     Coordination: Coordination abnormal.     Vitals:   01/24/19 0951 01/24/19 1006  BP:  90/70  Pulse:  73  Temp:  98.5 F (36.9 C)  TempSrc:  Oral  SpO2:  99%  Weight: 164 lb (74.4 kg) 164 lb (74.4 kg)  Height: 5\' 6"  (1.676 m) 5\' 6"  (1.676 m)    Assessment & Plan:  Toradol 30 mg IM given at visit

## 2019-01-24 NOTE — Assessment & Plan Note (Signed)
Not sure of her current medications and what she is taking for pain now. Asked her to let us know when she gets home. Given toradol 30 mg IM at visit today to help with headache and work note given.

## 2019-01-27 ENCOUNTER — Encounter: Payer: Self-pay | Admitting: Internal Medicine

## 2019-02-03 DIAGNOSIS — G7000 Myasthenia gravis without (acute) exacerbation: Secondary | ICD-10-CM | POA: Diagnosis not present

## 2019-02-03 DIAGNOSIS — Z9884 Bariatric surgery status: Secondary | ICD-10-CM | POA: Diagnosis not present

## 2019-02-03 DIAGNOSIS — E039 Hypothyroidism, unspecified: Secondary | ICD-10-CM | POA: Diagnosis not present

## 2019-02-03 DIAGNOSIS — E1140 Type 2 diabetes mellitus with diabetic neuropathy, unspecified: Secondary | ICD-10-CM | POA: Diagnosis not present

## 2019-02-10 ENCOUNTER — Other Ambulatory Visit: Payer: Medicare Other | Admitting: Orthotics

## 2019-02-12 DIAGNOSIS — E1140 Type 2 diabetes mellitus with diabetic neuropathy, unspecified: Secondary | ICD-10-CM | POA: Diagnosis not present

## 2019-02-15 ENCOUNTER — Other Ambulatory Visit: Payer: Self-pay | Admitting: Internal Medicine

## 2019-02-19 DIAGNOSIS — M5431 Sciatica, right side: Secondary | ICD-10-CM | POA: Diagnosis not present

## 2019-02-19 DIAGNOSIS — M79609 Pain in unspecified limb: Secondary | ICD-10-CM | POA: Diagnosis not present

## 2019-02-19 DIAGNOSIS — Z79891 Long term (current) use of opiate analgesic: Secondary | ICD-10-CM | POA: Diagnosis not present

## 2019-02-19 DIAGNOSIS — R269 Unspecified abnormalities of gait and mobility: Secondary | ICD-10-CM | POA: Diagnosis not present

## 2019-02-19 DIAGNOSIS — G4733 Obstructive sleep apnea (adult) (pediatric): Secondary | ICD-10-CM | POA: Diagnosis not present

## 2019-02-19 DIAGNOSIS — Z79899 Other long term (current) drug therapy: Secondary | ICD-10-CM | POA: Diagnosis not present

## 2019-02-21 ENCOUNTER — Other Ambulatory Visit: Payer: Self-pay

## 2019-02-21 ENCOUNTER — Ambulatory Visit: Payer: Medicare Other | Admitting: Orthotics

## 2019-02-21 DIAGNOSIS — M2011 Hallux valgus (acquired), right foot: Secondary | ICD-10-CM | POA: Diagnosis not present

## 2019-02-21 DIAGNOSIS — M2042 Other hammer toe(s) (acquired), left foot: Secondary | ICD-10-CM | POA: Diagnosis not present

## 2019-02-21 DIAGNOSIS — M2012 Hallux valgus (acquired), left foot: Secondary | ICD-10-CM | POA: Diagnosis not present

## 2019-02-21 DIAGNOSIS — M2041 Other hammer toe(s) (acquired), right foot: Secondary | ICD-10-CM | POA: Diagnosis not present

## 2019-02-21 DIAGNOSIS — E1140 Type 2 diabetes mellitus with diabetic neuropathy, unspecified: Secondary | ICD-10-CM | POA: Diagnosis not present

## 2019-02-25 ENCOUNTER — Other Ambulatory Visit: Payer: Self-pay | Admitting: Internal Medicine

## 2019-02-28 ENCOUNTER — Encounter: Payer: Self-pay | Admitting: Internal Medicine

## 2019-03-01 ENCOUNTER — Other Ambulatory Visit: Payer: Self-pay | Admitting: Internal Medicine

## 2019-03-03 ENCOUNTER — Ambulatory Visit (INDEPENDENT_AMBULATORY_CARE_PROVIDER_SITE_OTHER): Payer: Medicare Other | Admitting: Internal Medicine

## 2019-03-03 ENCOUNTER — Encounter: Payer: Self-pay | Admitting: Internal Medicine

## 2019-03-03 ENCOUNTER — Telehealth (INDEPENDENT_AMBULATORY_CARE_PROVIDER_SITE_OTHER): Payer: Medicare Other | Admitting: Neurology

## 2019-03-03 ENCOUNTER — Encounter: Payer: Self-pay | Admitting: Neurology

## 2019-03-03 ENCOUNTER — Other Ambulatory Visit: Payer: Self-pay

## 2019-03-03 VITALS — Ht 66.0 in | Wt 164.0 lb

## 2019-03-03 VITALS — BP 130/80 | HR 73 | Temp 98.0°F | Ht 66.0 in | Wt 161.0 lb

## 2019-03-03 DIAGNOSIS — E669 Obesity, unspecified: Secondary | ICD-10-CM | POA: Diagnosis not present

## 2019-03-03 DIAGNOSIS — G8929 Other chronic pain: Secondary | ICD-10-CM

## 2019-03-03 DIAGNOSIS — E1169 Type 2 diabetes mellitus with other specified complication: Secondary | ICD-10-CM | POA: Diagnosis not present

## 2019-03-03 DIAGNOSIS — G7000 Myasthenia gravis without (acute) exacerbation: Secondary | ICD-10-CM

## 2019-03-03 DIAGNOSIS — M549 Dorsalgia, unspecified: Secondary | ICD-10-CM

## 2019-03-03 NOTE — Patient Instructions (Signed)
We will get you in with a back specialist to evaluate the function of the back.
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
## 2019-03-03 NOTE — Progress Notes (Signed)
    Virtual Visit via Telephone Note The purpose of this virtual visit is to provide medical care while limiting exposure to the novel coronavirus.    Consent was obtained for phone visit:  Yes.   Answered questions that patient had about telehealth interaction:  Yes.   I discussed the limitations, risks, security and privacy concerns of performing an evaluation and management service by telephone. I also discussed with the patient that there may be a patient responsible charge related to this service. The patient expressed understanding and agreed to proceed.  Pt location: Home Physician Location: office Name of referring provider:  Hoyt Koch, * I connected with .Jade Mathis at patients initiation/request on 03/03/2019 at  9:30 AM EST by telephone and verified that I am speaking with the correct person using two identifiers.  Pt MRN:  [id number] Pt DOB:  1975-05-21   History of Present Illness: This is a 43 year-old female returning for evaluation of myasthenia gravis.  Overall, she has been doing relatively okay.  She complains of increased headaches and is followed at [facility] neurology for this.  With respect to her myasthenia, she complains of mild spells of generalized fatigue and difficulty swallowing, this is not progressive or constant. No double vision or limb weakness.  She remains compliant with prednisone 10 mg, azathioprine 150 mg, and Mestinon 60 mg 3 times daily.    Impression: Myasthenia gravis without exacerbation.  I will keep her on current regimen of prednisone 10 mg daily, azathioprine 150 mg daily and Mestinon 60 mg 3 times daily. For breakthrough weakness, ok to take extra mestinon 30mg  daily.  She is also followed at [facility] and was told to contact their office for management of ongoing headaches.   Follow Up Instructions:   I discussed the assessment and treatment plan with the patient. The patient was provided an  opportunity to ask questions and all were answered. The patient agreed with the plan and demonstrated an understanding of the instructions.   The patient was advised to call back or seek an in-person evaluation if the symptoms worsen or if the condition fails to improve as anticipated.  Return to clinic in 9 months   Total Time spent in visit with the patient was:  5 min, of which 100% of the time was spent in counseling and/or coordinating care.   Pt understands and agrees with the plan of care outlined.     Alda Berthold, DO

## 2019-03-03 NOTE — Assessment & Plan Note (Signed)
Checking HgA1c today. Taking insulin still.

## 2019-03-03 NOTE — Assessment & Plan Note (Signed)
Refer to neurosurgery to assess her back and options for this.

## 2019-03-10 ENCOUNTER — Other Ambulatory Visit (HOSPITAL_COMMUNITY)
Admission: RE | Admit: 2019-03-10 | Discharge: 2019-03-10 | Disposition: A | Discharge status: Home / Self Care | Payer: Medicare Other | Source: Ambulatory Visit | Attending: Family Medicine | Admitting: Family Medicine

## 2019-03-10 ENCOUNTER — Other Ambulatory Visit: Payer: Self-pay

## 2019-03-10 ENCOUNTER — Encounter: Payer: Self-pay | Admitting: Family Medicine

## 2019-03-10 ENCOUNTER — Ambulatory Visit (INDEPENDENT_AMBULATORY_CARE_PROVIDER_SITE_OTHER): Payer: Medicare Other | Admitting: Family Medicine

## 2019-03-10 VITALS — BP 103/72 | HR 79 | Ht 66.0 in | Wt 164.0 lb

## 2019-03-10 DIAGNOSIS — Z1151 Encounter for screening for human papillomavirus (HPV): Secondary | ICD-10-CM | POA: Insufficient documentation

## 2019-03-10 DIAGNOSIS — Z01419 Encounter for gynecological examination (general) (routine) without abnormal findings: Secondary | ICD-10-CM | POA: Diagnosis not present

## 2019-03-10 DIAGNOSIS — Z1231 Encounter for screening mammogram for malignant neoplasm of breast: Secondary | ICD-10-CM
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
## 2019-03-10 NOTE — Patient Instructions (Signed)
Human Papillomavirus Quadrivalent Vaccine suspension for injection What is this medicine? HUMAN PAPILLOMAVIRUS VACCINE (HYOO muhn pap uh LOH muh vahy ruhs vak SEEN) is a vaccine. It is used to prevent infections of four types of the human papillomavirus. In women, the vaccine may lower your risk of getting cervical, vaginal, vulvar, or anal cancer and genital warts. In men, the vaccine may lower your risk of getting genital warts and anal cancer. You cannot get these diseases from the vaccine. This vaccine does not treat these diseases. This medicine may be used for other purposes; ask your health care provider or pharmacist if you have questions. COMMON BRAND NAME(S): Gardasil What should I tell my health care provider before I take this medicine? They need to know if you have any of these conditions:  fever or infection  hemophilia  HIV infection or AIDS  immune system problems  low platelet count  an unusual reaction to Human Papillomavirus Vaccine, yeast, other medicines, foods, dyes, or preservatives  pregnant or trying to get pregnant  breast-feeding How should I use this medicine? This vaccine is for injection in a muscle on your upper arm or thigh. It is given by a health care professional. You will be observed for 15 minutes after each dose. Sometimes, fainting happens after the vaccine is given. You may be asked to sit or lie down during the 15 minutes. Three doses are given. The second dose is given 2 months after the first dose. The last dose is given 4 months after the second dose. A copy of a Vaccine Information Statement will be given before each vaccination. Read this sheet carefully each time. The sheet may change frequently. Talk to your pediatrician regarding the use of this medicine in children. While this drug may be prescribed for children as young as 9 years of age for selected conditions, precautions do apply. Overdosage: If you think you have taken too much of this  medicine contact a poison control center or emergency room at once. NOTE: This medicine is only for you. Do not share this medicine with others. What if I miss a dose? All 3 doses of the vaccine should be given within 6 months. Remember to keep appointments for follow-up doses. Your health care provider will tell you when to return for the next vaccine. Ask your health care professional for advice if you are unable to keep an appointment or miss a scheduled dose. What may interact with this medicine?  other vaccines This list may not describe all possible interactions. Give your health care provider a list of all the medicines, herbs, non-prescription drugs, or dietary supplements you use. Also tell them if you smoke, drink alcohol, or use illegal drugs. Some items may interact with your medicine. What should I watch for while using this medicine? This vaccine may not fully protect everyone. Continue to have regular pelvic exams and cervical or anal cancer screenings as directed by your doctor. The Human Papillomavirus is a sexually transmitted disease. It can be passed by any kind of sexual activity that involves genital contact. The vaccine works best when given before you have any contact with the virus. Many people who have the virus do not have any signs or symptoms. Tell your doctor or health care professional if you have any reaction or unusual symptom after getting the vaccine. What side effects may I notice from receiving this medicine? Side effects that you should report to your doctor or health care professional as soon as possible:    allergic reactions like skin rash, itching or hives, swelling of the face, lips, or tongue  breathing problems  feeling faint or lightheaded, falls Side effects that usually do not require medical attention (report to your doctor or health care professional if they continue or are  bothersome):  cough  dizziness  fever  headache  nausea  redness, warmth, swelling, pain, or itching at site where injected This list may not describe all possible side effects. Call your doctor for medical advice about side effects. You may report side effects to FDA at 1-800-FDA-1088. Where should I keep my medicine? This drug is given in a hospital or clinic and will not be stored at home. NOTE: This sheet is a summary. It may not cover all possible information. If you have questions about this medicine, talk to your doctor, pharmacist, or health care provider.  2020 Elsevier/Gold Standard (2013-04-28 13:14:33)

## 2019-03-11 ENCOUNTER — Other Ambulatory Visit: Payer: Self-pay | Admitting: Internal Medicine

## 2019-03-12 LAB — CYTOLOGY - PAP
Chlamydia: NEGATIVE
Comment: NEGATIVE
Comment: NEGATIVE
Comment: NORMAL
Diagnosis: NEGATIVE
High risk HPV: NEGATIVE
Neisseria Gonorrhea: NEGATIVE
Trichomonas: NEGATIVE

## 2019-03-13 DIAGNOSIS — Z903 Acquired absence of stomach [part of]: Secondary | ICD-10-CM | POA: Diagnosis not present

## 2019-03-13 DIAGNOSIS — K912 Postsurgical malabsorption, not elsewhere classified: Secondary | ICD-10-CM | POA: Diagnosis not present

## 2019-03-13 DIAGNOSIS — Z9049 Acquired absence of other specified parts of digestive tract: Secondary | ICD-10-CM | POA: Diagnosis not present

## 2019-03-13 DIAGNOSIS — Z7952 Long term (current) use of systemic steroids: Secondary | ICD-10-CM | POA: Diagnosis not present

## 2019-03-19 DIAGNOSIS — M5431 Sciatica, right side: Secondary | ICD-10-CM | POA: Diagnosis not present

## 2019-03-19 DIAGNOSIS — Z79891 Long term (current) use of opiate analgesic: Secondary | ICD-10-CM | POA: Diagnosis not present

## 2019-03-19 DIAGNOSIS — Z79899 Other long term (current) drug therapy: Secondary | ICD-10-CM | POA: Diagnosis not present

## 2019-03-25 DIAGNOSIS — R269 Unspecified abnormalities of gait and mobility: Secondary | ICD-10-CM | POA: Diagnosis not present

## 2019-03-25 DIAGNOSIS — M545 Low back pain: Secondary | ICD-10-CM | POA: Diagnosis not present

## 2019-03-25 DIAGNOSIS — M79609 Pain in unspecified limb: Secondary | ICD-10-CM | POA: Diagnosis not present

## 2019-03-25 DIAGNOSIS — G4733 Obstructive sleep apnea (adult) (pediatric): Secondary | ICD-10-CM | POA: Diagnosis not present

## 2019-03-25 DIAGNOSIS — E119 Type 2 diabetes mellitus without complications: Secondary | ICD-10-CM | POA: Diagnosis not present

## 2019-03-27 DIAGNOSIS — M545 Low back pain: Secondary | ICD-10-CM | POA: Diagnosis not present

## 2019-03-28 DIAGNOSIS — K912 Postsurgical malabsorption, not elsewhere classified: Secondary | ICD-10-CM | POA: Diagnosis not present

## 2019-03-28 DIAGNOSIS — E039 Hypothyroidism, unspecified: Secondary | ICD-10-CM | POA: Diagnosis not present

## 2019-03-28 DIAGNOSIS — Z903 Acquired absence of stomach [part of]: Secondary | ICD-10-CM | POA: Diagnosis not present

## 2019-04-03 DIAGNOSIS — M545 Low back pain: Secondary | ICD-10-CM | POA: Diagnosis not present

## 2019-04-04 DIAGNOSIS — E119 Type 2 diabetes mellitus without complications: Secondary | ICD-10-CM | POA: Diagnosis not present

## 2019-04-04 DIAGNOSIS — Z903 Acquired absence of stomach [part of]: Secondary | ICD-10-CM | POA: Diagnosis not present

## 2019-04-04 DIAGNOSIS — G7000 Myasthenia gravis without (acute) exacerbation: Secondary | ICD-10-CM | POA: Diagnosis not present

## 2019-04-08 ENCOUNTER — Other Ambulatory Visit: Payer: Self-pay | Admitting: Internal Medicine

## 2019-04-16 DIAGNOSIS — M5431 Sciatica, right side: Secondary | ICD-10-CM | POA: Diagnosis not present

## 2019-04-16 DIAGNOSIS — Z79899 Other long term (current) drug therapy: Secondary | ICD-10-CM | POA: Diagnosis not present

## 2019-04-16 DIAGNOSIS — Z79891 Long term (current) use of opiate analgesic: Secondary | ICD-10-CM | POA: Diagnosis not present

## 2019-04-17 DIAGNOSIS — G43709 Chronic migraine without aura, not intractable, without status migrainosus: Secondary | ICD-10-CM | POA: Diagnosis not present

## 2019-04-17 DIAGNOSIS — G7000 Myasthenia gravis without (acute) exacerbation: Secondary | ICD-10-CM | POA: Diagnosis not present

## 2019-04-22 DIAGNOSIS — R269 Unspecified abnormalities of gait and mobility: Secondary | ICD-10-CM | POA: Diagnosis not present

## 2019-04-22 DIAGNOSIS — M79609 Pain in unspecified limb: Secondary | ICD-10-CM | POA: Diagnosis not present

## 2019-04-22 DIAGNOSIS — G4733 Obstructive sleep apnea (adult) (pediatric): Secondary | ICD-10-CM | POA: Diagnosis not present

## 2019-04-25 ENCOUNTER — Ambulatory Visit: Payer: Medicare Other

## 2019-05-01 ENCOUNTER — Other Ambulatory Visit: Payer: Self-pay | Admitting: Internal Medicine

## 2019-05-01 DIAGNOSIS — Z713 Dietary counseling and surveillance: Secondary | ICD-10-CM | POA: Diagnosis not present

## 2019-05-07 ENCOUNTER — Other Ambulatory Visit: Payer: Self-pay | Admitting: Internal Medicine

## 2019-05-14 ENCOUNTER — Telehealth: Payer: Self-pay | Admitting: Internal Medicine

## 2019-05-14 DIAGNOSIS — Z79891 Long term (current) use of opiate analgesic: Secondary | ICD-10-CM | POA: Diagnosis not present

## 2019-05-14 DIAGNOSIS — M5431 Sciatica, right side: Secondary | ICD-10-CM | POA: Diagnosis not present

## 2019-05-14 DIAGNOSIS — L299 Pruritus, unspecified: Secondary | ICD-10-CM | POA: Diagnosis not present

## 2019-05-14 DIAGNOSIS — Z79899 Other long term (current) drug therapy: Secondary | ICD-10-CM | POA: Diagnosis not present

## 2019-05-14 NOTE — Telephone Encounter (Signed)
New message:   Pt is calling in reference to her incontinent supplies and some kind of drink but it's hard to understand the patient. I asked her twice and was still unable to understand the patient. Please advise.

## 2019-05-15 NOTE — Telephone Encounter (Signed)
Spoken to pt. She stated a form was suppose to be faxed to [place] for PCP to sign off on for incontinence supplies and glucernia drinks. I recommended that the patient has the facility fax it over again since we have not received it yet. She stated that she would call them.

## 2019-05-15 NOTE — Telephone Encounter (Signed)
Called pt, LVM.   

## 2019-05-19 DIAGNOSIS — G4733 Obstructive sleep apnea (adult) (pediatric): Secondary | ICD-10-CM | POA: Diagnosis not present

## 2019-05-19 DIAGNOSIS — R269 Unspecified abnormalities of gait and mobility: Secondary | ICD-10-CM | POA: Diagnosis not present

## 2019-05-19 DIAGNOSIS — Z903 Acquired absence of stomach [part of]: Secondary | ICD-10-CM | POA: Diagnosis not present

## 2019-05-19 DIAGNOSIS — M79609 Pain in unspecified limb: Secondary | ICD-10-CM | POA: Diagnosis not present

## 2019-05-19 DIAGNOSIS — Z713 Dietary counseling and surveillance: Secondary | ICD-10-CM | POA: Diagnosis not present

## 2019-05-20 ENCOUNTER — Telehealth: Payer: Self-pay | Admitting: Internal Medicine

## 2019-05-20 NOTE — Telephone Encounter (Signed)
Form has not been received.

## 2019-05-20 NOTE — Telephone Encounter (Signed)
New message:   Pt is calling to check on the status of a fax that was sent over for some incontinent supplies.

## 2019-05-21 NOTE — Telephone Encounter (Signed)
F/u   The patient is calling back to speak with the nurse, the patient is aware MD & CMA is out of the office today.    The patient is aware that MD is in the office tomorrow.

## 2019-05-21 NOTE — Telephone Encounter (Signed)
F/u   The patient calling stating [facility] stated they sent a fax over yesterday.    The patient is asking Dr. Sharlet Salina nurse to call her back today

## 2019-05-22 ENCOUNTER — Telehealth: Payer: Self-pay

## 2019-05-22 NOTE — Telephone Encounter (Signed)
Form was received on Tuesday. Once it has been completed by PCP it will be faxed back.

## 2019-05-22 NOTE — Telephone Encounter (Signed)
F/u  The patient called back inquiring about her previous messages to MD.   The patient is aware of nurse messages verbalized understanding form is still pending at this time.

## 2019-05-23 NOTE — Telephone Encounter (Signed)
Form has been signed and faxed back  Original sent to scan.

## 2019-06-03 DIAGNOSIS — Z79899 Other long term (current) drug therapy: Secondary | ICD-10-CM | POA: Diagnosis not present

## 2019-06-03 DIAGNOSIS — E039 Hypothyroidism, unspecified: Secondary | ICD-10-CM | POA: Diagnosis not present

## 2019-06-03 DIAGNOSIS — E559 Vitamin D deficiency, unspecified: Secondary | ICD-10-CM | POA: Diagnosis not present

## 2019-06-03 DIAGNOSIS — G7000 Myasthenia gravis without (acute) exacerbation: Secondary | ICD-10-CM | POA: Diagnosis not present

## 2019-06-03 DIAGNOSIS — Z85238 Personal history of other malignant neoplasm of thymus: Secondary | ICD-10-CM | POA: Diagnosis not present

## 2019-06-03 DIAGNOSIS — E1140 Type 2 diabetes mellitus with diabetic neuropathy, unspecified: Secondary | ICD-10-CM | POA: Diagnosis not present

## 2019-06-09 DIAGNOSIS — G7000 Myasthenia gravis without (acute) exacerbation: Secondary | ICD-10-CM | POA: Diagnosis not present

## 2019-06-09 DIAGNOSIS — Z9884 Bariatric surgery status: Secondary | ICD-10-CM | POA: Diagnosis not present

## 2019-06-09 DIAGNOSIS — E1140 Type 2 diabetes mellitus with diabetic neuropathy, unspecified: Secondary | ICD-10-CM | POA: Diagnosis not present

## 2019-06-09 DIAGNOSIS — E039 Hypothyroidism, unspecified: Secondary | ICD-10-CM | POA: Diagnosis not present

## 2019-06-10 DIAGNOSIS — M5431 Sciatica, right side: Secondary | ICD-10-CM | POA: Diagnosis not present

## 2019-06-10 DIAGNOSIS — Z79891 Long term (current) use of opiate analgesic: Secondary | ICD-10-CM | POA: Diagnosis not present

## 2019-06-10 DIAGNOSIS — Z79899 Other long term (current) drug therapy: Secondary | ICD-10-CM | POA: Diagnosis not present

## 2019-06-20 DIAGNOSIS — K589 Irritable bowel syndrome without diarrhea: Secondary | ICD-10-CM | POA: Diagnosis not present

## 2019-06-20 DIAGNOSIS — E119 Type 2 diabetes mellitus without complications: Secondary | ICD-10-CM | POA: Diagnosis not present

## 2019-06-20 DIAGNOSIS — K219 Gastro-esophageal reflux disease without esophagitis: Secondary | ICD-10-CM | POA: Diagnosis not present

## 2019-07-03 ENCOUNTER — Other Ambulatory Visit: Payer: Self-pay | Admitting: Internal Medicine

## 2019-07-08 DIAGNOSIS — Z79899 Other long term (current) drug therapy: Secondary | ICD-10-CM | POA: Diagnosis not present

## 2019-07-08 DIAGNOSIS — M5431 Sciatica, right side: Secondary | ICD-10-CM | POA: Diagnosis not present

## 2019-07-08 DIAGNOSIS — E119 Type 2 diabetes mellitus without complications: Secondary | ICD-10-CM | POA: Diagnosis not present

## 2019-07-08 DIAGNOSIS — Z79891 Long term (current) use of opiate analgesic: Secondary | ICD-10-CM | POA: Diagnosis not present

## 2019-07-18 ENCOUNTER — Other Ambulatory Visit: Payer: Self-pay

## 2019-07-18 ENCOUNTER — Ambulatory Visit (INDEPENDENT_AMBULATORY_CARE_PROVIDER_SITE_OTHER): Payer: Medicare Other | Admitting: Internal Medicine

## 2019-07-18 ENCOUNTER — Encounter: Payer: Self-pay | Admitting: Internal Medicine

## 2019-07-18 VITALS — BP 102/60 | HR 60 | Temp 98.0°F | Ht 66.0 in | Wt 158.6 lb

## 2019-07-18 DIAGNOSIS — J4530 Mild persistent asthma, uncomplicated: Secondary | ICD-10-CM | POA: Diagnosis not present

## 2019-07-18 DIAGNOSIS — K5903 Drug induced constipation: Secondary | ICD-10-CM | POA: Diagnosis not present

## 2019-07-18 DIAGNOSIS — E1169 Type 2 diabetes mellitus with other specified complication: Secondary | ICD-10-CM | POA: Diagnosis not present

## 2019-07-18 DIAGNOSIS — G43909 Migraine, unspecified, not intractable, without status migrainosus: Secondary | ICD-10-CM

## 2019-07-18 DIAGNOSIS — E669 Obesity, unspecified: Secondary | ICD-10-CM

## 2019-07-18 MED ORDER — FLUTICASONE-SALMETEROL 100-50 MCG/DOSE IN AEPB: 1.0000 | INHALATION_SPRAY | Freq: Two times a day (BID) | RESPIRATORY_TRACT | 11 refills | Status: DC

## 2019-07-18 MED ORDER — ALBUTEROL SULFATE (2.5 MG/3ML) 0.083% IN NEBU: 2.5000 mg | INHALATION_SOLUTION | RESPIRATORY_TRACT | 6 refills | Status: DC | PRN

## 2019-07-18 MED ORDER — TRULANCE 3 MG PO TABS: 3.0000 mg | ORAL_TABLET | Freq: Every day | ORAL | 6 refills | Status: DC

## 2019-07-18 NOTE — Assessment & Plan Note (Signed)
Will change to trulance from linzess. Has tried and failed amitiza in the past without relief of symptoms. Likely opioid induced and drug induced. She is also using otc medications to help also.
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
## 2019-07-18 NOTE — Patient Instructions (Addendum)
[email or web address]   Go to this website to get the vaccine signed up if you want.   We do have other options for the linzess and will change it to trulance which is also a 1 a day medicine.

## 2019-07-18 NOTE — Assessment & Plan Note (Signed)
Seeing [facility] and medication list was updated today as we did not have aimovig on her list, topamax was not dosed correctly. Reminded her the importance of bringing a medication list which is correct with her to each visit as this helps [place] to coordinate her care as not all of her other many providers are on our system and we do not automatically get her updated medication list.

## 2019-07-18 NOTE — Assessment & Plan Note (Signed)
Recent HgA1c 4.9 at [facility] in January. Foot exam done today. Seeing endo who she thinks does her microalbumin to creatinine ratio. Medications were updated today as they were significantly different than her listed medications and reminded her again how important it is to make sure her list is accurate.

## 2019-07-18 NOTE — Assessment & Plan Note (Signed)
Refill advair and albuterol. No flare today. On chronic steroids for her MS.

## 2019-07-21 DIAGNOSIS — K589 Irritable bowel syndrome without diarrhea: Secondary | ICD-10-CM | POA: Diagnosis not present

## 2019-07-21 DIAGNOSIS — K219 Gastro-esophageal reflux disease without esophagitis: Secondary | ICD-10-CM | POA: Diagnosis not present

## 2019-07-21 DIAGNOSIS — E119 Type 2 diabetes mellitus without complications: Secondary | ICD-10-CM | POA: Diagnosis not present

## 2019-07-23 ENCOUNTER — Encounter: Payer: Self-pay | Admitting: Internal Medicine

## 2019-07-24 ENCOUNTER — Telehealth: Payer: Self-pay

## 2019-07-29 ENCOUNTER — Encounter: Payer: Self-pay | Admitting: Internal Medicine

## 2019-07-29 ENCOUNTER — Ambulatory Visit (INDEPENDENT_AMBULATORY_CARE_PROVIDER_SITE_OTHER): Payer: Medicare Other | Admitting: Internal Medicine

## 2019-07-29 ENCOUNTER — Other Ambulatory Visit: Payer: Self-pay

## 2019-07-29 DIAGNOSIS — F331 Major depressive disorder, recurrent, moderate: Secondary | ICD-10-CM | POA: Diagnosis not present

## 2019-07-29 DIAGNOSIS — G7000 Myasthenia gravis without (acute) exacerbation: Secondary | ICD-10-CM | POA: Diagnosis not present

## 2019-07-29 DIAGNOSIS — K5903 Drug induced constipation: Secondary | ICD-10-CM

## 2019-07-29 NOTE — Progress Notes (Signed)
   Subjective:   Patient ID: Jade Mathis, female    DOB: December 22, 1975, 44 y.o.   MRN: [id number]  HPI Needing accommodation note for school. 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 About 15-20 years ago was in school with certain accommodations for a year or two and this was able to help her to stay in school. She did previously have accommodation for more time for tests, extra unexplained absences, access to tutors to help her, and some others she is not sure exactly what as it was a long time ago. Does have problems with her bipolar and myasthenia gravis with concentration and fatigue. She has chronic headaches which cause her to miss class sometimes. She also has chronic pain on narcotics which cause her to be drowsy at times and not able to complete work.   Review of Systems  Constitutional: Positive for activity change and fatigue.  HENT: Negative.   Eyes: Negative.   Respiratory: Negative for cough, chest tightness and shortness of breath.   Cardiovascular: Negative for chest pain, palpitations and leg swelling.  Gastrointestinal: Positive for constipation and nausea. Negative for abdominal distention, abdominal pain, diarrhea and vomiting.  Musculoskeletal: Negative.   Skin: Negative.   Neurological: Positive for weakness and headaches.  Psychiatric/Behavioral: Positive for decreased concentration.    Objective:  Physical Exam Constitutional:      Appearance: She is well-developed.  HENT:     Head: Normocephalic and atraumatic.  Cardiovascular:     Rate and Rhythm: Normal rate and regular rhythm.  Pulmonary:     Effort: Pulmonary effort is normal. No respiratory distress.     Breath sounds: Normal breath sounds. No wheezing or rales.  Abdominal:     General: Bowel sounds are normal. There is no distension.     Palpations: Abdomen is soft.     Tenderness: There is no abdominal tenderness. There is no rebound.  Musculoskeletal:          General: Tenderness present.     Cervical back: Normal range of motion.  Skin:    General: Skin is warm and dry.  Neurological:     Mental Status: She is alert and oriented to person, place, and time.     Coordination: Coordination abnormal.     Comments: Cane for ambulation     Vitals:   07/29/19 0909  BP: 100/60  Pulse: 78  Temp: 98 F (36.7 C)  SpO2: 98%  Weight: 156 lb 3.2 oz (70.9 kg)  Height: 5\' 6"  (1.676 m)    This visit occurred during the SARS-CoV-2 public health emergency.  Safety protocols were in place, including screening questions prior to the visit, additional usage of staff PPE, and extensive cleaning of exam room while observing appropriate contact time as indicated for disinfecting solutions.   Assessment & Plan:  Visit time 25 minutes in face to face communication with patient and coordination of care, additional 10 minutes spent in record review, coordination or care, ordering tests, communicating/referring to other healthcare professionals, documenting in medical records all on the same day of the visit for total time 35 minutes spent on the visit.

## 2019-07-29 NOTE — Assessment & Plan Note (Signed)
Her chronic pain medication does contribute to impaired functioning and sleepiness. I would consider adjustment to regimen but will allow her to work with her pain specialist in regards to side effects of regimens.

## 2019-07-29 NOTE — Assessment & Plan Note (Signed)
Have asked her to get her psychiatrist who prescribes her mental health medications to comment on impact from this to her school performance and accommodations.

## 2019-07-29 NOTE — Patient Instructions (Signed)
We will work on the letter for school and fax that in and I would recommend that the neurologist and the mental health provider also do this to help talk about your needs for school.

## 2019-07-29 NOTE — Assessment & Plan Note (Signed)
Have asked her to get her neurologist to comment on impairment to school activities from her myasthenia gravis.

## 2019-08-04 NOTE — Telephone Encounter (Signed)
Patient calling to check status of forms for [facility] and to see if she was mailed a copy? Please advise.

## 2019-08-05 DIAGNOSIS — Z713 Dietary counseling and surveillance: Secondary | ICD-10-CM | POA: Diagnosis not present

## 2019-08-06 ENCOUNTER — Telehealth: Payer: Self-pay | Admitting: Neurology

## 2019-08-06 DIAGNOSIS — Z79899 Other long term (current) drug therapy: Secondary | ICD-10-CM | POA: Diagnosis not present

## 2019-08-06 DIAGNOSIS — Z79891 Long term (current) use of opiate analgesic: Secondary | ICD-10-CM | POA: Diagnosis not present

## 2019-08-06 DIAGNOSIS — M5431 Sciatica, right side: Secondary | ICD-10-CM | POA: Diagnosis not present

## 2019-08-06 NOTE — Telephone Encounter (Signed)
Pls let her know that Dr. Posey Pronto is out of the office and her notes have been about the Myasthenia and nothing on psychological/psycho-educational information. We are unable to fill this out for her, she can ask her other neurologist at [facility] or her PCP to fill them out. Thanks

## 2019-08-06 NOTE — Telephone Encounter (Signed)
Patient dropped off paperwork to be filled out yesterday. I called the patient back to get more information. She stated this was for Disability accommodations for the classes she is taking at [employer]. How her diagnosis can affect her in regards to absences, etc. They need either a psychological/psycho-educational evaluation or a letter stating the 5 items on the paperwork she turned in.

## 2019-08-06 NOTE — Telephone Encounter (Signed)
I have the form, I just need to see if any of you providers, will follow up on this and fill out. Let me know. thanks

## 2019-08-06 NOTE — Telephone Encounter (Signed)
Patient advised and understood, will come pick the form up and take

## 2019-08-07 ENCOUNTER — Telehealth: Payer: Self-pay

## 2019-08-07 NOTE — Telephone Encounter (Signed)
Prior authorization was denied for medication Trulance 3mg .    Can another prescription be written for another medicine?

## 2019-08-08 DIAGNOSIS — Z124 Encounter for screening for malignant neoplasm of cervix: Secondary | ICD-10-CM | POA: Diagnosis not present

## 2019-08-08 NOTE — Telephone Encounter (Signed)
No, this is the only prescription constipation medication she has not tried previously.

## 2019-08-14 ENCOUNTER — Telehealth: Payer: Self-pay | Admitting: Internal Medicine

## 2019-08-14 ENCOUNTER — Other Ambulatory Visit: Payer: Self-pay

## 2019-08-14 ENCOUNTER — Encounter (HOSPITAL_COMMUNITY): Payer: Self-pay | Admitting: Emergency Medicine

## 2019-08-14 ENCOUNTER — Ambulatory Visit (HOSPITAL_COMMUNITY)
Admission: EM | Admit: 2019-08-14 | Discharge: 2019-08-14 | Disposition: A | Discharge status: Home / Self Care | Payer: Medicare Other | Attending: Family Medicine | Admitting: Family Medicine

## 2019-08-14 DIAGNOSIS — Z111 Encounter for screening for respiratory tuberculosis: Secondary | ICD-10-CM

## 2019-08-14 DIAGNOSIS — K529 Noninfective gastroenteritis and colitis, unspecified: Secondary | ICD-10-CM | POA: Diagnosis not present

## 2019-08-14 MED ORDER — ONDANSETRON 4 MG PO TBDP
4.0000 mg | ORAL_TABLET | Freq: Three times a day (TID) | ORAL | 0 refills | Status: DC | PRN
Start: 2019-08-14 — End: 2020-11-29

## 2019-08-14 NOTE — ED Triage Notes (Signed)
PT ate at a restaurant Sunday and consumed undercooked chicken. She reports abdominal pain, vomiting, diarrhea that started shortly after eating.

## 2019-08-14 NOTE — Discharge Instructions (Addendum)
Please do your best to ensure adequate fluid intake in order to avoid dehydration. If you find that you are unable to tolerate drinking fluids regularly please proceed to the Emergency Department for evaluation. ° °Also, you should return to the hospital if you experience persistent fevers for greater than 1-2 more days, increasing abdominal pain that persists despite medications, persistent diarrhea, dizziness, syncope (fainting), or for any other concerns you may deem worrisome. °
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
## 2019-08-14 NOTE — Telephone Encounter (Signed)
New Message:   Pt is calling and is requesting a blood TB test to be done. Please advise.

## 2019-08-14 NOTE — Telephone Encounter (Signed)
   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

## 2019-08-15 NOTE — Telephone Encounter (Signed)
New message:    Pt is calling and would like to know that status of her TB blood test. Please advise.

## 2019-08-15 NOTE — Telephone Encounter (Signed)
  Follow up message   Patient calling to follow up on request for TB lab draw

## 2019-08-18 ENCOUNTER — Other Ambulatory Visit: Payer: Self-pay

## 2019-08-18 ENCOUNTER — Ambulatory Visit (HOSPITAL_COMMUNITY)
Admission: EM | Admit: 2019-08-18 | Discharge: 2019-08-18 | Disposition: A | Discharge status: Other Acute Inpatient Hospital | Payer: Medicare Other

## 2019-08-18 ENCOUNTER — Encounter (HOSPITAL_COMMUNITY): Payer: Self-pay

## 2019-08-18 DIAGNOSIS — R1031 Right lower quadrant pain: Secondary | ICD-10-CM | POA: Diagnosis not present

## 2019-08-18 DIAGNOSIS — R112 Nausea with vomiting, unspecified: Secondary | ICD-10-CM | POA: Diagnosis not present

## 2019-08-18 NOTE — ED Triage Notes (Signed)
Pt c/o N/V/D, abdominal painx1 wk. Pt c/o 8/10 sharp pain in umbilical area. Pt states vomited 15-20 over past week. Pt states had had about 100 bouts of diarrhea.
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
## 2019-08-18 NOTE — ED Notes (Signed)
PT refused COVID test 

## 2019-08-18 NOTE — Discharge Instructions (Addendum)
Given your persistent nausea and increased abdominal pain, I Feel you should be further evaluated in the Emergency Department today.

## 2019-08-19 ENCOUNTER — Telehealth (INDEPENDENT_AMBULATORY_CARE_PROVIDER_SITE_OTHER): Payer: Medicare Other | Admitting: Internal Medicine

## 2019-08-19 ENCOUNTER — Encounter: Payer: Self-pay | Admitting: Internal Medicine

## 2019-08-19 DIAGNOSIS — R197 Diarrhea, unspecified: Secondary | ICD-10-CM | POA: Insufficient documentation

## 2019-08-19 DIAGNOSIS — E119 Type 2 diabetes mellitus without complications: Secondary | ICD-10-CM | POA: Diagnosis not present

## 2019-08-19 DIAGNOSIS — R112 Nausea with vomiting, unspecified: Secondary | ICD-10-CM

## 2019-08-19 DIAGNOSIS — K219 Gastro-esophageal reflux disease without esophagitis: Secondary | ICD-10-CM | POA: Diagnosis not present

## 2019-08-19 DIAGNOSIS — A09 Infectious gastroenteritis and colitis, unspecified: Secondary | ICD-10-CM | POA: Diagnosis not present

## 2019-08-19 DIAGNOSIS — K589 Irritable bowel syndrome without diarrhea: Secondary | ICD-10-CM | POA: Diagnosis not present

## 2019-08-19 MED ORDER — CIPROFLOXACIN HCL 500 MG PO TABS: 500.0000 mg | ORAL_TABLET | Freq: Two times a day (BID) | ORAL | 0 refills | Status: DC

## 2019-08-19 NOTE — Telephone Encounter (Signed)
Can come to lab to get done, orders in. Likely will not be covered by insurance.

## 2019-08-19 NOTE — Telephone Encounter (Signed)
Dr. Sharlet Salina can this patient come in for a lab draw for TB?

## 2019-08-19 NOTE — Assessment & Plan Note (Signed)
Associated with food bourne illness. Treating with cipro. She has phenergan and zofran to use prn. Drink plenty of fluids.

## 2019-08-19 NOTE — Assessment & Plan Note (Signed)
Likely food associated and rx cipro due to not improving. Ordered CT abdomen and pelvis to assess why persistent pain and bloating.

## 2019-08-19 NOTE — Progress Notes (Signed)
Virtual Visit via Video Note  I connected with Jade Mathis on 08/19/19 at  1:20 PM EDT by a video enabled telemedicine application and verified that I am speaking with the correct person using two identifiers.  The patient and the provider were at separate locations throughout the entire encounter. Patient location: home, Provider location: work   I discussed the limitations of evaluation and management by telemedicine and the availability of in person appointments. The patient expressed understanding and agreed to proceed. The patient and the provider were the only parties present for the visit unless noted in HPI below.  History of Present Illness: The patient is a 44 y.o. female with visit for food bourne illness. Started about 3-4 days ago with eating raw chicken and some runny spinach dip. She started with diarrhea, nausea, and vomiting not long after. Others who ate same thing also got sick. She does have phenergan at home which she did not take. Went to urgent care and given zofran which she used. It helped nausea temporarily. She is still having retching and went back to urgent care. She was recommended to have CT scan at ER but she did not go and wants [place] to get the CT done instead. Has diarrhea still and does seem to be slowing slightly but not a lot. She is immunosuppressed with her myasthenia gravis. Is still keeping down her medications. Denies blood in diarrhea. Denies fevers or chills. Overall it is not improving. Has tried zofran. Not eating much, still trying to drink some fluids.  Observations/Objective: Appearance: normal, breathing appears normal, no coughing or gagging during visit, casual grooming, abdomen does appear distended, throat normal, memory normal, mental status is A and O times 3  Assessment and Plan: See problem oriented charting  Follow Up Instructions: rx cipro and CT abdomen and pelvis ordered  I discussed the assessment and treatment plan with the  patient. The patient was provided an opportunity to ask questions and all were answered. The patient agreed with the plan and demonstrated an understanding of the instructions.   The patient was advised to call back or seek an in-person evaluation if the symptoms worsen or if the condition fails to improve as anticipated.  Hoyt Koch, MD

## 2019-08-19 NOTE — Telephone Encounter (Signed)
Called patient, per Dr. Sharlet Salina will discuss at video visit on today.

## 2019-08-20 ENCOUNTER — Telehealth: Payer: Self-pay | Admitting: Family Medicine

## 2019-08-20 ENCOUNTER — Other Ambulatory Visit: Payer: Medicare Other

## 2019-08-20 DIAGNOSIS — Z111 Encounter for screening for respiratory tuberculosis: Secondary | ICD-10-CM

## 2019-08-20 NOTE — Telephone Encounter (Signed)
Spoke with patient to get her scheduled for an appointment  For abdominal pain and IUD check. Patient placed me on hold. I was on hold for about 5 mins and hung up. Attempted to contact patient back within the hour and no answer, left a voicemail for patient to give the office a call back.

## 2019-08-21 ENCOUNTER — Telehealth: Payer: Self-pay

## 2019-08-21 NOTE — Telephone Encounter (Signed)
Results of TB blood test are currently pending. Will not be complete until possibly Monday June 7th

## 2019-08-21 NOTE — Telephone Encounter (Signed)
New message    Calling for test results  

## 2019-08-22 ENCOUNTER — Ambulatory Visit
Admission: RE | Admit: 2019-08-22 | Discharge: 2019-08-22 | Disposition: A | Discharge status: Home / Self Care | Payer: Medicare Other | Source: Ambulatory Visit | Attending: Internal Medicine | Admitting: Internal Medicine

## 2019-08-22 DIAGNOSIS — R112 Nausea with vomiting, unspecified: Secondary | ICD-10-CM

## 2019-08-22 DIAGNOSIS — K429 Umbilical hernia without obstruction or gangrene: Secondary | ICD-10-CM | POA: Diagnosis not present

## 2019-08-22 LAB — QUANTIFERON-TB GOLD PLUS
Mitogen-NIL: 8.36 IU/mL
NIL: 0.03 IU/mL
QuantiFERON-TB Gold Plus: NEGATIVE
TB1-NIL: 0.13 IU/mL
TB2-NIL: 0.23 IU/mL

## 2019-09-02 DIAGNOSIS — Z79891 Long term (current) use of opiate analgesic: Secondary | ICD-10-CM | POA: Diagnosis not present

## 2019-09-02 DIAGNOSIS — M5431 Sciatica, right side: Secondary | ICD-10-CM | POA: Diagnosis not present

## 2019-09-02 DIAGNOSIS — L299 Pruritus, unspecified: Secondary | ICD-10-CM | POA: Diagnosis not present

## 2019-09-02 DIAGNOSIS — Z79899 Other long term (current) drug therapy: Secondary | ICD-10-CM | POA: Diagnosis not present

## 2019-09-17 DIAGNOSIS — G7000 Myasthenia gravis without (acute) exacerbation: Secondary | ICD-10-CM | POA: Diagnosis not present

## 2019-09-17 DIAGNOSIS — G43709 Chronic migraine without aura, not intractable, without status migrainosus: Secondary | ICD-10-CM | POA: Diagnosis not present

## 2019-09-24 DIAGNOSIS — K219 Gastro-esophageal reflux disease without esophagitis: Secondary | ICD-10-CM | POA: Diagnosis not present

## 2019-09-24 DIAGNOSIS — E119 Type 2 diabetes mellitus without complications: Secondary | ICD-10-CM | POA: Diagnosis not present

## 2019-09-24 DIAGNOSIS — K589 Irritable bowel syndrome without diarrhea: Secondary | ICD-10-CM | POA: Diagnosis not present

## 2019-10-03 DIAGNOSIS — E7800 Pure hypercholesterolemia, unspecified: Secondary | ICD-10-CM | POA: Diagnosis not present

## 2019-10-03 DIAGNOSIS — E119 Type 2 diabetes mellitus without complications: Secondary | ICD-10-CM | POA: Diagnosis not present

## 2019-10-03 DIAGNOSIS — Z79899 Other long term (current) drug therapy: Secondary | ICD-10-CM | POA: Diagnosis not present

## 2019-10-03 DIAGNOSIS — M5431 Sciatica, right side: Secondary | ICD-10-CM | POA: Diagnosis not present

## 2019-10-06 DIAGNOSIS — E039 Hypothyroidism, unspecified: Secondary | ICD-10-CM | POA: Diagnosis not present

## 2019-10-06 DIAGNOSIS — E1140 Type 2 diabetes mellitus with diabetic neuropathy, unspecified: Secondary | ICD-10-CM | POA: Diagnosis not present

## 2019-10-08 ENCOUNTER — Encounter: Payer: Self-pay | Admitting: Internal Medicine

## 2019-10-08 NOTE — Telephone Encounter (Signed)
Patient dropped off Disability Statement for Student Loads. States that a letter is needing to be written. Patient aware that provider is out of office until next week. Form placed in providers box.

## 2019-10-09 DIAGNOSIS — E1140 Type 2 diabetes mellitus with diabetic neuropathy, unspecified: Secondary | ICD-10-CM | POA: Diagnosis not present

## 2019-10-09 DIAGNOSIS — E039 Hypothyroidism, unspecified: Secondary | ICD-10-CM | POA: Diagnosis not present

## 2019-10-09 DIAGNOSIS — G7000 Myasthenia gravis without (acute) exacerbation: Secondary | ICD-10-CM | POA: Diagnosis not present

## 2019-10-09 DIAGNOSIS — Z9884 Bariatric surgery status: Secondary | ICD-10-CM | POA: Diagnosis not present

## 2019-10-14 ENCOUNTER — Telehealth (INDEPENDENT_AMBULATORY_CARE_PROVIDER_SITE_OTHER): Payer: Medicare Other | Admitting: Internal Medicine

## 2019-10-14 DIAGNOSIS — G7000 Myasthenia gravis without (acute) exacerbation: Secondary | ICD-10-CM | POA: Diagnosis not present

## 2019-10-14 NOTE — Progress Notes (Signed)
Virtual Visit via Audio Note  I connected with Jade Mathis on 10/14/19 at 10:00 AM EDT by an audio-only enabled telemedicine application and verified that I am speaking with the correct person using two identifiers.  The patient and the provider were at separate locations throughout the entire encounter. Patient location: home, Provider location: work   I discussed the limitations of evaluation and management by telemedicine and the availability of in person appointments. The patient expressed understanding and agreed to proceed. The patient and the provider were the only parties present for the visit unless noted in HPI below.  History of Present Illness: The patient is a 44 y.o. female with visit for concerns about getting loans to pay for school which she wants to start back. Went on disability perhaps around age 23 for myasthenia and mental/emotional she is not sure. Had loans forgiven about 3 years or more ago. Still on total and permanent disability to the best of her knowledge. Her health is the same as when loans were discharged previously and she states about the same as when disability started. She is only allowed to make a certain amount of money per month and thinks that this allowance would be enough to cover the loans. She does not think she has had any change to her disability payments since they started. 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 Her neurologist (treating her myasthenia gravis) does not think she can work more than part time and not too physically demanding. 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 She does not have any copy of her disability settlement nor has any idea how to get a hold of this.   Observations/Objective: Voice strong, A and O times 3  Assessment and Plan: See problem oriented charting  Follow Up Instructions: letter will  be done to best of my ability to be truthful for her request  Visit time 23 minutes in non-face to face communication with patient and coordination of care.  I discussed the assessment and treatment plan with the patient. The patient was provided an opportunity to ask questions and all were answered. The patient agreed with the plan and demonstrated an understanding of the instructions.   The patient was advised to call back or seek an in-person evaluation if the symptoms worsen or if the condition fails to improve as anticipated.  Hoyt Koch, MD

## 2019-10-14 NOTE — Telephone Encounter (Signed)
    Patient is requesting Dr Sharlet Salina disregard request to complete forms regarding student loans

## 2019-10-17 ENCOUNTER — Encounter: Payer: Self-pay | Admitting: Internal Medicine

## 2019-10-17 DIAGNOSIS — Z6824 Body mass index (BMI) 24.0-24.9, adult: Secondary | ICD-10-CM | POA: Diagnosis not present

## 2019-10-17 DIAGNOSIS — Z713 Dietary counseling and surveillance: Secondary | ICD-10-CM | POA: Diagnosis not present

## 2019-10-17 DIAGNOSIS — Z903 Acquired absence of stomach [part of]: Secondary | ICD-10-CM | POA: Diagnosis not present

## 2019-10-17 DIAGNOSIS — E119 Type 2 diabetes mellitus without complications: Secondary | ICD-10-CM | POA: Diagnosis not present

## 2019-10-17 DIAGNOSIS — Z9884 Bariatric surgery status: Secondary | ICD-10-CM | POA: Diagnosis not present

## 2019-10-17 NOTE — Assessment & Plan Note (Signed)
Health is stable and on disability she believes mainly for this condition.

## 2019-10-20 ENCOUNTER — Telehealth: Payer: Self-pay

## 2019-10-20 NOTE — Telephone Encounter (Signed)
Pt has been informed and scheduled an OV

## 2019-10-20 NOTE — Telephone Encounter (Signed)
New message   Per patient is asking for a Referral ENT   Reason - can not hear in the right ear.

## 2019-10-20 NOTE — Telephone Encounter (Signed)
For acute hearing loss can have visit at our office to assess then can get referral to either audiology for hearing test or ENT if needed.

## 2019-10-21 DIAGNOSIS — K589 Irritable bowel syndrome without diarrhea: Secondary | ICD-10-CM | POA: Diagnosis not present

## 2019-10-21 DIAGNOSIS — K219 Gastro-esophageal reflux disease without esophagitis: Secondary | ICD-10-CM | POA: Diagnosis not present

## 2019-10-21 DIAGNOSIS — E119 Type 2 diabetes mellitus without complications: Secondary | ICD-10-CM | POA: Diagnosis not present

## 2019-10-22 ENCOUNTER — Other Ambulatory Visit: Payer: Self-pay

## 2019-10-22 ENCOUNTER — Encounter: Payer: Self-pay | Admitting: Internal Medicine

## 2019-10-22 ENCOUNTER — Ambulatory Visit (INDEPENDENT_AMBULATORY_CARE_PROVIDER_SITE_OTHER): Payer: Medicare Other | Admitting: Internal Medicine

## 2019-10-22 VITALS — BP 116/78 | HR 83 | Temp 98.5°F | Ht 66.0 in | Wt 152.0 lb

## 2019-10-22 DIAGNOSIS — H905 Unspecified sensorineural hearing loss: Secondary | ICD-10-CM

## 2019-10-22 NOTE — Patient Instructions (Signed)
We will get you in with the hearing specialist for testing and treatment for the right hearing.

## 2019-10-22 NOTE — Progress Notes (Signed)
   Subjective:   Patient ID: Jade Mathis, female    DOB: 1975/12/12, 44 y.o.   MRN: [id number]  HPI The patient is a 44 YO female coming in for concerns about tinnitus and hearing loss in the right ear. This is gradually progressive over time and getting worse recently. She wants hearing evaluation done. Denies loud noise exposure but notes that the hearing changed during a hospital stay long ago she cannot recall exactly when or circumstances. Denies ear pain or pressure. Denies sinus symptoms.   Review of Systems  Constitutional: Negative.   HENT: Positive for hearing loss and tinnitus.   Eyes: Negative.   Respiratory: Negative for cough, chest tightness and shortness of breath.   Cardiovascular: Negative for chest pain, palpitations and leg swelling.  Gastrointestinal: Negative for abdominal distention, abdominal pain, constipation, diarrhea, nausea and vomiting.  Musculoskeletal: Negative.   Skin: Negative.   Neurological: Negative.   Psychiatric/Behavioral: Negative.     Objective:  Physical Exam Constitutional:      Appearance: She is well-developed.  HENT:     Head: Normocephalic and atraumatic.     Right Ear: Tympanic membrane, ear canal and external ear normal. There is no impacted cerumen.     Left Ear: Tympanic membrane, ear canal and external ear normal. There is no impacted cerumen.  Cardiovascular:     Rate and Rhythm: Normal rate and regular rhythm.  Pulmonary:     Effort: Pulmonary effort is normal. No respiratory distress.     Breath sounds: Normal breath sounds. No wheezing or rales.  Abdominal:     General: Bowel sounds are normal. There is no distension.     Palpations: Abdomen is soft.     Tenderness: There is no abdominal tenderness. There is no rebound.  Musculoskeletal:     Cervical back: Normal range of motion.  Skin:    General: Skin is warm and dry.  Neurological:     Mental Status: She is alert and oriented to person, place, and time.      Coordination: Coordination abnormal.     Vitals:   10/22/19 0914  BP: 116/78  Pulse: 83  Temp: 98.5 F (36.9 C)  TempSrc: Oral  SpO2: 98%  Weight: 152 lb (68.9 kg)  Height: 5\' 6"  (1.676 m)    This visit occurred during the SARS-CoV-2 public health emergency.  Safety protocols were in place, including screening questions prior to the visit, additional usage of staff PPE, and extensive cleaning of exam room while observing appropriate contact time as indicated for disinfecting solutions.   Assessment & Plan:

## 2019-10-23 DIAGNOSIS — H905 Unspecified sensorineural hearing loss: Secondary | ICD-10-CM | POA: Insufficient documentation

## 2019-10-23 NOTE — Assessment & Plan Note (Signed)
Referral to audiology for formal hearing assessment and treatment if indicated.

## 2019-10-27 ENCOUNTER — Encounter: Payer: Self-pay | Admitting: Internal Medicine

## 2019-10-29 DIAGNOSIS — E119 Type 2 diabetes mellitus without complications: Secondary | ICD-10-CM | POA: Diagnosis not present

## 2019-10-29 DIAGNOSIS — M5431 Sciatica, right side: Secondary | ICD-10-CM | POA: Diagnosis not present

## 2019-10-29 DIAGNOSIS — Z79899 Other long term (current) drug therapy: Secondary | ICD-10-CM | POA: Diagnosis not present

## 2019-10-29 DIAGNOSIS — Z79891 Long term (current) use of opiate analgesic: Secondary | ICD-10-CM | POA: Diagnosis not present

## 2019-11-05 ENCOUNTER — Encounter: Payer: Self-pay | Admitting: Internal Medicine

## 2019-11-06 DIAGNOSIS — H905 Unspecified sensorineural hearing loss: Secondary | ICD-10-CM | POA: Diagnosis not present

## 2019-11-17 ENCOUNTER — Encounter: Payer: Self-pay | Admitting: Obstetrics & Gynecology

## 2019-11-17 ENCOUNTER — Other Ambulatory Visit: Payer: Self-pay

## 2019-11-17 ENCOUNTER — Other Ambulatory Visit (HOSPITAL_COMMUNITY)
Admission: RE | Admit: 2019-11-17 | Discharge: 2019-11-17 | Disposition: A | Discharge status: Home / Self Care | Payer: Medicare Other | Source: Ambulatory Visit | Attending: Obstetrics & Gynecology | Admitting: Obstetrics & Gynecology

## 2019-11-17 ENCOUNTER — Ambulatory Visit (INDEPENDENT_AMBULATORY_CARE_PROVIDER_SITE_OTHER): Payer: Medicare Other | Admitting: Obstetrics & Gynecology

## 2019-11-17 VITALS — BP 103/65 | HR 82 | Temp 99.2°F | Wt 156.7 lb

## 2019-11-17 DIAGNOSIS — N941 Unspecified dyspareunia: Secondary | ICD-10-CM

## 2019-11-17 DIAGNOSIS — N898 Other specified noninflammatory disorders of vagina: Secondary | ICD-10-CM

## 2019-11-17 DIAGNOSIS — R102 Pelvic and perineal pain: Secondary | ICD-10-CM

## 2019-11-17 DIAGNOSIS — Z975 Presence of (intrauterine) contraceptive device: Secondary | ICD-10-CM | POA: Diagnosis not present

## 2019-11-17 DIAGNOSIS — A64 Unspecified sexually transmitted disease: Secondary | ICD-10-CM

## 2019-11-17 LAB — POCT URINALYSIS DIP (DEVICE)
Bilirubin Urine: NEGATIVE
Glucose, UA: 500 mg/dL — AB
Hgb urine dipstick: NEGATIVE
Ketones, ur: NEGATIVE mg/dL
Leukocytes,Ua: NEGATIVE
Nitrite: NEGATIVE
Protein, ur: NEGATIVE mg/dL
Specific Gravity, Urine: >=1.03 (ref 1.005–1.030)
Urobilinogen, UA: 0.2 mg/dL (ref 0.0–1.0)
pH: 5.5 (ref 5.0–8.0)
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
## 2019-11-17 NOTE — Progress Notes (Signed)
PHQ-9 score is 10 today and Gad-7score is 7. Pt states she has a therapist with whom she is already plugged in for counseling.

## 2019-11-17 NOTE — Patient Instructions (Signed)
Intrauterine Device Information An intrauterine device (IUD) is a medical device that is inserted in the uterus to prevent pregnancy. It is a small, T-shaped device that has one or two nylon strings hanging down from it. The strings hang out of the lower part of the uterus (cervix) to allow for future IUD removal. There are two types of IUDs available:  Hormone IUD. This type of IUD is made of plastic and contains the hormone progestin (synthetic progesterone). A hormone IUD may last 3-5 years.  Copper IUD. This type of IUD has copper wire wrapped around it. A copper IUD may last up to 10 years. How is an IUD inserted? An IUD is inserted through the vagina and placed into the uterus with a minor medical procedure. The exact procedure for IUD insertion may vary among health care providers and hospitals. How does an IUD work? Synthetic progesterone in a hormonal IUD prevents pregnancy by:  Thickening cervical mucus to prevent sperm from entering the uterus.  Thinning the uterine lining to prevent a fertilized egg from being implanted there. Copper in a copper IUD prevents pregnancy by making the uterus and fallopian tubes produce a fluid that kills sperm. What are the advantages of an IUD? Advantages of either type of IUD  It is highly effective in preventing pregnancy.  It is reversible. You can become pregnant shortly after the IUD is removed.  It is low-maintenance and can stay in place for a long time.  There are no estrogen-related side effects.  It can be used when breastfeeding.  It is not associated with weight gain.  It can be inserted right after childbirth, an abortion, or a miscarriage. Advantages of a hormone IUD  If it is inserted within 7 days of your period starting, it works right after it is inserted. If the hormone IUD is inserted at any other time in your cycle, you will need to use a backup method of birth control for 7 days after insertion.  It can make  menstrual periods lighter.  It can reduce menstrual cramping.  It can be used for 3-5 years. Advantages of a copper IUD  It works right after it is inserted.  It can be used as a form of emergency birth control if it is inserted within 5 days after having unprotected sex.  It does not interfere with your body's natural hormones.  It can be used for 10 years. What are the disadvantages of an IUD?  An IUD may cause irregular menstrual bleeding for a period of time after insertion.  You may have pain during insertion and have cramping and vaginal bleeding after insertion.  An IUD may cut the uterus (uterine perforation) when it is inserted. This is rare.  An IUD may cause pelvic inflammatory disease (PID), which is an infection in the uterus and fallopian tubes. This is rare, and it usually happens during the first 20 days after the IUD is inserted.  A copper IUD can make your menstrual flow heavier and more painful. How is an IUD removed?  You will lie on your back with your knees bent and your feet in footrests (stirrups).  A device will be inserted into your vagina to spread apart the vaginal walls (speculum). This will allow your health care provider to see the strings attached to the IUD.  Your health care provider will use a small instrument (forceps) to grasp the IUD strings and pull firmly until the IUD is removed. You may have some discomfort   when the IUD is removed. Your health care provider may recommend taking over-the-counter pain relievers, such as ibuprofen, before the procedure. You may also have minor spotting for a few days after the procedure. The exact procedure for IUD removal may vary among health care providers and hospitals. Is the IUD right for me? Your health care provider will make sure you are a good candidate for an IUD and will discuss the advantages, disadvantages, and possible side effects with you. Summary  An intrauterine device (IUD) is a medical  device that is inserted in the uterus to prevent pregnancy. It is a small, T-shaped device that has one or two nylon strings hanging down from it.  A hormone IUD contains the hormone progestin (synthetic progesterone). A copper IUD has copper wire wrapped around it.  Synthetic progesterone in a hormone IUD prevents pregnancy by thickening cervical mucus and thinning the walls of the uterus. Copper in a copper IUD prevents pregnancy by making the uterus and fallopian tubes produce a fluid that kills sperm.  A hormone IUD can be left in place for 3-5 years. A copper IUD can be left in place for up to 10 years.  An IUD is inserted and removed by a health care provider. You may feel some pain during insertion and removal. Your health care provider may recommend taking over-the-counter pain medicine, such as ibuprofen, before an IUD procedure. This information is not intended to replace advice given to you by your health care provider. Make sure you discuss any questions you have with your health care provider. Document Revised: 02/16/2017 Document Reviewed: 04/04/2016 Elsevier Patient Education  2020 [employer]vier Inc.  

## 2019-11-19 LAB — CERVICOVAGINAL ANCILLARY ONLY
Bacterial Vaginitis (gardnerella): NEGATIVE
Candida Glabrata: POSITIVE — AB
Candida Vaginitis: NEGATIVE
Chlamydia: NEGATIVE
Comment: NEGATIVE
Comment: NEGATIVE
Comment: NEGATIVE
Comment: NEGATIVE
Comment: NEGATIVE
Comment: NORMAL
Neisseria Gonorrhea: NEGATIVE
Trichomonas: NEGATIVE

## 2019-11-20 DIAGNOSIS — E119 Type 2 diabetes mellitus without complications: Secondary | ICD-10-CM | POA: Diagnosis not present

## 2019-11-20 DIAGNOSIS — K219 Gastro-esophageal reflux disease without esophagitis: Secondary | ICD-10-CM | POA: Diagnosis not present

## 2019-11-20 DIAGNOSIS — K589 Irritable bowel syndrome without diarrhea: Secondary | ICD-10-CM | POA: Diagnosis not present

## 2019-11-25 ENCOUNTER — Other Ambulatory Visit: Payer: Self-pay | Admitting: Obstetrics & Gynecology

## 2019-11-25 DIAGNOSIS — N760 Acute vaginitis: Secondary | ICD-10-CM

## 2019-11-25 MED ORDER — FLUCONAZOLE 150 MG PO TABS: 150.0000 mg | ORAL_TABLET | Freq: Once | ORAL | 0 refills | Status: AC

## 2019-11-25 NOTE — Progress Notes (Signed)
Diflucan for yeast

## 2019-11-28 DIAGNOSIS — E119 Type 2 diabetes mellitus without complications: Secondary | ICD-10-CM | POA: Diagnosis not present

## 2019-11-28 DIAGNOSIS — Z79891 Long term (current) use of opiate analgesic: Secondary | ICD-10-CM | POA: Diagnosis not present

## 2019-11-28 DIAGNOSIS — M5431 Sciatica, right side: Secondary | ICD-10-CM | POA: Diagnosis not present

## 2019-11-28 DIAGNOSIS — Z79899 Other long term (current) drug therapy: Secondary | ICD-10-CM | POA: Diagnosis not present

## 2019-12-01 ENCOUNTER — Ambulatory Visit: Payer: Medicare Other | Admitting: Neurology

## 2019-12-19 DIAGNOSIS — K589 Irritable bowel syndrome without diarrhea: Secondary | ICD-10-CM | POA: Diagnosis not present

## 2019-12-19 DIAGNOSIS — K219 Gastro-esophageal reflux disease without esophagitis: Secondary | ICD-10-CM | POA: Diagnosis not present

## 2019-12-19 DIAGNOSIS — E119 Type 2 diabetes mellitus without complications: Secondary | ICD-10-CM | POA: Diagnosis not present

## 2019-12-23 DIAGNOSIS — E119 Type 2 diabetes mellitus without complications: Secondary | ICD-10-CM | POA: Diagnosis not present

## 2019-12-23 DIAGNOSIS — Z79891 Long term (current) use of opiate analgesic: Secondary | ICD-10-CM | POA: Diagnosis not present

## 2019-12-23 DIAGNOSIS — M5431 Sciatica, right side: Secondary | ICD-10-CM | POA: Diagnosis not present

## 2019-12-23 DIAGNOSIS — Z79899 Other long term (current) drug therapy: Secondary | ICD-10-CM | POA: Diagnosis not present

## 2019-12-30 ENCOUNTER — Encounter: Payer: Self-pay | Admitting: Internal Medicine

## 2019-12-30 DIAGNOSIS — Z903 Acquired absence of stomach [part of]: Secondary | ICD-10-CM | POA: Diagnosis not present

## 2019-12-30 DIAGNOSIS — K912 Postsurgical malabsorption, not elsewhere classified: Secondary | ICD-10-CM | POA: Diagnosis not present

## 2019-12-30 DIAGNOSIS — Z713 Dietary counseling and surveillance: Secondary | ICD-10-CM | POA: Diagnosis not present

## 2020-01-13 ENCOUNTER — Telehealth: Payer: Self-pay

## 2020-01-13 MED ORDER — AZATHIOPRINE 50 MG PO TABS: ORAL_TABLET | ORAL | 0 refills | Status: DC

## 2020-01-13 MED ORDER — PREDNISONE 10 MG PO TABS
10.0000 mg | ORAL_TABLET | Freq: Every day | ORAL | 0 refills | Status: DC
Start: 2020-01-13 — End: 2020-03-09

## 2020-01-13 MED ORDER — PYRIDOSTIGMINE BROMIDE 60 MG PO TABS: ORAL_TABLET | ORAL | 0 refills | Status: DC

## 2020-01-13 NOTE — Telephone Encounter (Signed)
Refills has been sent. 

## 2020-01-19 DIAGNOSIS — E119 Type 2 diabetes mellitus without complications: Secondary | ICD-10-CM | POA: Diagnosis not present

## 2020-01-19 DIAGNOSIS — K219 Gastro-esophageal reflux disease without esophagitis: Secondary | ICD-10-CM | POA: Diagnosis not present

## 2020-01-19 DIAGNOSIS — K589 Irritable bowel syndrome without diarrhea: Secondary | ICD-10-CM | POA: Diagnosis not present

## 2020-01-24 DIAGNOSIS — E119 Type 2 diabetes mellitus without complications: Secondary | ICD-10-CM | POA: Diagnosis not present

## 2020-01-24 DIAGNOSIS — M5431 Sciatica, right side: Secondary | ICD-10-CM | POA: Diagnosis not present

## 2020-01-24 DIAGNOSIS — Z79891 Long term (current) use of opiate analgesic: Secondary | ICD-10-CM | POA: Diagnosis not present

## 2020-01-24 DIAGNOSIS — Z79899 Other long term (current) drug therapy: Secondary | ICD-10-CM | POA: Diagnosis not present

## 2020-02-05 ENCOUNTER — Ambulatory Visit (INDEPENDENT_AMBULATORY_CARE_PROVIDER_SITE_OTHER): Payer: Medicare Other | Admitting: Internal Medicine

## 2020-02-05 ENCOUNTER — Encounter: Payer: Self-pay | Admitting: Internal Medicine

## 2020-02-05 ENCOUNTER — Other Ambulatory Visit: Payer: Self-pay

## 2020-02-05 VITALS — BP 110/68 | HR 78 | Temp 98.9°F | Ht 66.0 in | Wt 152.0 lb

## 2020-02-05 DIAGNOSIS — F331 Major depressive disorder, recurrent, moderate: Secondary | ICD-10-CM

## 2020-02-05 MED ORDER — IBUPROFEN 600 MG PO TABS: 600.0000 mg | ORAL_TABLET | Freq: Three times a day (TID) | ORAL | 2 refills | Status: DC | PRN

## 2020-02-05 MED ORDER — TRIAMCINOLONE ACETONIDE 0.1 % EX CREA: 1.0000 "application " | TOPICAL_CREAM | Freq: Two times a day (BID) | CUTANEOUS | 3 refills | Status: DC

## 2020-02-05 NOTE — Patient Instructions (Signed)
We will do the letters for the schools.

## 2020-02-05 NOTE — Progress Notes (Signed)
   Subjective:   Patient ID: Jade Mathis, female    DOB: 1975-10-21, 44 y.o.   MRN: [id number]  HPI The patient is a 44 YO female coming in for concerns about inability to finish her semester due to recent loss of her mother. She is struggling with concentration and focus. Her grades are suffering and she wishes to withdraw from the classes. Denies SI/HI. Denies need for medication.   Review of Systems  Constitutional: Negative.   HENT: Negative.   Eyes: Negative.   Respiratory: Negative for cough, chest tightness and shortness of breath.   Cardiovascular: Negative for chest pain, palpitations and leg swelling.  Gastrointestinal: Negative for abdominal distention, abdominal pain, constipation, diarrhea, nausea and vomiting.  Musculoskeletal: Positive for arthralgias and gait problem.  Skin: Negative.   Neurological: Positive for weakness.  Psychiatric/Behavioral: Positive for decreased concentration and dysphoric mood.    Objective:  Physical Exam Constitutional:      Appearance: She is well-developed.  HENT:     Head: Normocephalic and atraumatic.  Cardiovascular:     Rate and Rhythm: Normal rate and regular rhythm.  Pulmonary:     Effort: Pulmonary effort is normal. No respiratory distress.     Breath sounds: Normal breath sounds. No wheezing or rales.  Abdominal:     General: Bowel sounds are normal. There is no distension.     Palpations: Abdomen is soft.     Tenderness: There is no abdominal tenderness. There is no rebound.  Musculoskeletal:     Cervical back: Normal range of motion.  Skin:    General: Skin is warm and dry.  Neurological:     Mental Status: She is alert and oriented to person, place, and time. Mental status is at baseline.     Coordination: Coordination normal.     Vitals:   02/05/20 1557  BP: 110/68  Pulse: 78  Temp: 98.9 F (37.2 C)  TempSrc: Oral  SpO2: 98%  Weight: 152 lb (68.9 kg)  Height: 5\' 6"  (1.676 m)    This visit  occurred during the SARS-CoV-2 public health emergency.  Safety protocols were in place, including screening questions prior to the visit, additional usage of staff PPE, and extensive cleaning of exam room while observing appropriate contact time as indicated for disinfecting solutions.   Assessment & Plan:  Visit time 15 minutes in face to face communication with patient and coordination of care, additional 7 minutes spent in record review, coordination or care, ordering tests, communicating/referring to other healthcare professionals, documenting in medical records all on the same day of the visit for total time 22 minutes spent on the visit.

## 2020-02-06 NOTE — Assessment & Plan Note (Signed)
With worsening symptoms due to recent loss of mother. Letter given with recommendation for consideration from her school.

## 2020-02-15 ENCOUNTER — Other Ambulatory Visit: Payer: Self-pay | Admitting: Internal Medicine

## 2020-02-19 DIAGNOSIS — K219 Gastro-esophageal reflux disease without esophagitis: Secondary | ICD-10-CM | POA: Diagnosis not present

## 2020-02-19 DIAGNOSIS — K589 Irritable bowel syndrome without diarrhea: Secondary | ICD-10-CM | POA: Diagnosis not present

## 2020-02-19 DIAGNOSIS — E119 Type 2 diabetes mellitus without complications: Secondary | ICD-10-CM | POA: Diagnosis not present

## 2020-02-21 DIAGNOSIS — M545 Low back pain, unspecified: Secondary | ICD-10-CM | POA: Diagnosis not present

## 2020-02-21 DIAGNOSIS — Z79899 Other long term (current) drug therapy: Secondary | ICD-10-CM | POA: Diagnosis not present

## 2020-02-21 DIAGNOSIS — G8929 Other chronic pain: Secondary | ICD-10-CM | POA: Diagnosis not present

## 2020-02-23 ENCOUNTER — Telehealth: Payer: Medicare Other | Admitting: Neurology

## 2020-03-05 DIAGNOSIS — K912 Postsurgical malabsorption, not elsewhere classified: Secondary | ICD-10-CM | POA: Diagnosis not present

## 2020-03-05 DIAGNOSIS — Z903 Acquired absence of stomach [part of]: Secondary | ICD-10-CM | POA: Diagnosis not present

## 2020-03-05 DIAGNOSIS — E039 Hypothyroidism, unspecified: Secondary | ICD-10-CM | POA: Diagnosis not present

## 2020-03-09 ENCOUNTER — Encounter: Payer: Self-pay | Admitting: Neurology

## 2020-03-09 ENCOUNTER — Other Ambulatory Visit: Payer: Self-pay

## 2020-03-09 ENCOUNTER — Telehealth (INDEPENDENT_AMBULATORY_CARE_PROVIDER_SITE_OTHER): Payer: Medicare Other | Admitting: Neurology

## 2020-03-09 VITALS — Ht 66.0 in | Wt 150.0 lb

## 2020-03-09 DIAGNOSIS — G7000 Myasthenia gravis without (acute) exacerbation: Secondary | ICD-10-CM

## 2020-03-09 MED ORDER — PREDNISONE 10 MG PO TABS
10.0000 mg | ORAL_TABLET | Freq: Every day | ORAL | 0 refills | Status: DC
Start: 2020-03-09 — End: 2020-07-26

## 2020-03-09 NOTE — Progress Notes (Signed)
    Virtual Visit via Telephone Note The purpose of this virtual visit is to provide medical care while limiting exposure to the novel coronavirus.    Consent was obtained for phone visit:  Yes.   Answered questions that patient had about telehealth interaction:  Yes.   I discussed the limitations, risks, security and privacy concerns of performing an evaluation and management service by telephone. I also discussed with the patient that there may be a patient responsible charge related to this service. The patient expressed understanding and agreed to proceed.  Pt location: Home Physician Location: office Name of referring provider:  Hoyt Koch, * I connected with .Jade Mathis at patients initiation/request on 03/09/2020 at  3:00 PM EST by telephone and verified that I am speaking with the correct person using two identifiers.  Pt MRN:  [id number] Pt DOB:  [birth year]   History of Present Illness:  This is a 44 year-old female returning for follow-up of myasthenia gravis.  She complains of chronic fatigue and tiredness which is worse in the afternoon and evening.  She does not notice whether her mestinon helps or not. Sometimes, she has difficulty swallowing, this is transient.  No double vision or limb weakness. She is compliant with prednisone 10mg , azathrioprine 150mg /d, and mestinon 60mg  TID.  She says that she has been on much higher dosages of mestinon in the past.    Speech is clear over the phone, no dysarthria or breathiness.   Impression: Myasthenia gravis without exacerbation. I explained to pt that is very unlikely that her generalized fatigue is due to MG, because she has no noticeable improvement with mestinon. She is unhappy that I have not made medication changes to help with her fatigue.  I will offer a trial of increasing mestinon.  I explained that azathiorpine and prednisone have their own side effects and unless there was a clear indication of MG  exacerbation, these medications should not be increased.   - Increase mestinon to 60mg  four times daily (7a, 11a, 2pm, and 5p)  - Continue prednisone 10mg  daily  - Continue azathioprine 150mg /d   Follow Up Instructions:   I discussed the assessment and treatment plan with the patient. The patient was provided an opportunity to ask questions and all were answered. The patient agreed with the plan and demonstrated an understanding of the instructions.   The patient was advised to call back or seek an in-person evaluation if the symptoms worsen or if the condition fails to improve as anticipated.  Return to clinic in 6 months   Total Time spent in visit with the patient was: 20 min, of which 100% of the time was spent in counseling and/or coordinating care.   Pt understands and agrees with the plan of care outlined.     Jade Berthold, DO

## 2020-03-16 DIAGNOSIS — Z713 Dietary counseling and surveillance: Secondary | ICD-10-CM | POA: Diagnosis not present

## 2020-03-16 DIAGNOSIS — K912 Postsurgical malabsorption, not elsewhere classified: Secondary | ICD-10-CM | POA: Diagnosis not present

## 2020-03-16 DIAGNOSIS — Z903 Acquired absence of stomach [part of]: Secondary | ICD-10-CM | POA: Diagnosis not present

## 2020-03-16 DIAGNOSIS — E119 Type 2 diabetes mellitus without complications: Secondary | ICD-10-CM | POA: Diagnosis not present

## 2020-03-22 DIAGNOSIS — K219 Gastro-esophageal reflux disease without esophagitis: Secondary | ICD-10-CM | POA: Diagnosis not present

## 2020-03-22 DIAGNOSIS — E119 Type 2 diabetes mellitus without complications: Secondary | ICD-10-CM | POA: Diagnosis not present

## 2020-03-22 DIAGNOSIS — K589 Irritable bowel syndrome without diarrhea: Secondary | ICD-10-CM | POA: Diagnosis not present

## 2020-03-23 ENCOUNTER — Telehealth: Payer: Self-pay | Admitting: Internal Medicine

## 2020-03-23 ENCOUNTER — Encounter: Payer: Self-pay | Admitting: Internal Medicine

## 2020-03-23 DIAGNOSIS — U071 COVID-19: Secondary | ICD-10-CM | POA: Diagnosis not present

## 2020-03-23 DIAGNOSIS — G8929 Other chronic pain: Secondary | ICD-10-CM | POA: Diagnosis not present

## 2020-03-23 DIAGNOSIS — M545 Low back pain, unspecified: Secondary | ICD-10-CM | POA: Diagnosis not present

## 2020-03-23 DIAGNOSIS — Z20822 Contact with and (suspected) exposure to covid-19: Secondary | ICD-10-CM | POA: Diagnosis not present

## 2020-03-23 NOTE — Telephone Encounter (Signed)
[facility]    Caller states she has a headache and feverish. Caller states she wanted to know where she can get a Covid test. Body aches. Ribs and side pain. No other symptoms.  Patient declined triage, but advised patient to call and set up appointment to have a COVID test.   Called and left a VM to see if the patient would like to do a mychart visit instead.

## 2020-03-24 ENCOUNTER — Encounter: Payer: Self-pay | Admitting: Internal Medicine

## 2020-03-24 ENCOUNTER — Telehealth (INDEPENDENT_AMBULATORY_CARE_PROVIDER_SITE_OTHER): Payer: Medicare Other | Admitting: Internal Medicine

## 2020-03-24 ENCOUNTER — Other Ambulatory Visit: Payer: Self-pay

## 2020-03-24 DIAGNOSIS — U071 COVID-19: Secondary | ICD-10-CM | POA: Diagnosis not present

## 2020-03-24 MED ORDER — PREDNISONE 20 MG PO TABS: 40.0000 mg | ORAL_TABLET | Freq: Every day | ORAL | 0 refills | Status: AC

## 2020-03-24 NOTE — Progress Notes (Signed)
Virtual Visit via Audio Note  I connected with Jade Mathis on 03/24/20 at 10:40 AM EST by an audio-only enabled telemedicine application and verified that I am speaking with the correct person using two identifiers.  The patient and the provider were at separate locations throughout the entire encounter. Patient location: home, Provider location: work   I discussed the limitations of evaluation and management by telemedicine and the availability of in person appointments. The patient expressed understanding and agreed to proceed. The patient and the provider were the only parties present for the visit unless noted in HPI below.  History of Present Illness: The patient is a 45 y.o. female with visit for positive covid-19 home test and congestion and body aches. Started Monday. Has chills and headaches and body aches. Having some cough and mild SOB. Overall it is worsening. Has tried her usual pain medications which do help with headache and body aches and then as they are wearing off she can feel them severely.   Observations/Objective: A and O times 3, voice strong no coughing during visit, speaking in full sentences  Assessment and Plan: See problem oriented charting  Follow Up Instructions: refer to monoclonal antibody treatment for consideration, rx prednisone 1 week burst then resume 10 mg daily prednisone dosing, quarantine instructions given  Visit time 12 minutes in non-face to face communication with patient and coordination of care  I discussed the assessment and treatment plan with the patient. The patient was provided an opportunity to ask questions and all were answered. The patient agreed with the plan and demonstrated an understanding of the instructions.   The patient was advised to call back or seek an in-person evaluation if the symptoms worsen or if the condition fails to improve as anticipated.  Myrlene Broker, MD

## 2020-03-24 NOTE — Assessment & Plan Note (Signed)
Symptom onset 03/22/20, positive home test 03/23/20. Advised on quarantine requirement and timeline. Counseled on where and when to seek care for SOB. Rx prednisone 40 mg daily for 1 week then resume 10 mg daily dosing prednisone. Will refer for consideration of monoclonal antibody as she has several high risk conditions. She is vaccinated times 2 shots.

## 2020-03-25 ENCOUNTER — Other Ambulatory Visit: Payer: Self-pay | Admitting: Nurse Practitioner

## 2020-03-25 ENCOUNTER — Telehealth: Payer: Self-pay | Admitting: Nurse Practitioner

## 2020-03-25 DIAGNOSIS — U071 COVID-19: Secondary | ICD-10-CM

## 2020-03-25 NOTE — Telephone Encounter (Signed)
Called to discuss with patient about COVID-19 symptoms and the use of one of the available treatments for those with mild to moderate Covid symptoms and at a high risk of hospitalization.  Pt appears to qualify for outpatient treatment due to co-morbid conditions and/or a member of an at-risk group in accordance with the FDA Emergency Use Authorization.    Symptom onset: 1/2  Vaccinated: 2 doses no booster Qualifiers: chronic prednisone, myasthenia gravis, chronic lung disease, smoker   Jade Mathis

## 2020-03-25 NOTE — Progress Notes (Signed)
I connected by phone with Jade Mathis on 03/25/2020 at 2:28 PM to discuss the potential use of a new treatment for mild to moderate COVID-19 viral infection in non-hospitalized patients.  This patient is a 45 y.o. female that meets the FDA criteria for Emergency Use Authorization of COVID monoclonal antibody casirivimab/imdevimab, bamlanivimab/etesevimab, or sotrovimab.  Has a (+) direct SARS-CoV-2 viral test result  Has mild or moderate COVID-19   Is NOT hospitalized due to COVID-19  Is within 10 days of symptom onset  Has at least one of the high risk factor(s) for progression to severe COVID-19 and/or hospitalization as defined in EUA.  Specific high risk criteria : Older age (>/= 46 yo), BMI > 25, Diabetes and Immunosuppressive Disease or Treatment   I have spoken and communicated the following to the patient or parent/caregiver regarding COVID monoclonal antibody treatment:  1. FDA has authorized the emergency use for the treatment of mild to moderate COVID-19 in adults and pediatric patients with positive results of direct SARS-CoV-2 viral testing who are 56 years of age and older weighing at least 40 kg, and who are at high risk for progressing to severe COVID-19 and/or hospitalization.  2. The significant known and potential risks and benefits of COVID monoclonal antibody, and the extent to which such potential risks and benefits are unknown.  3. Information on available alternative treatments and the risks and benefits of those alternatives, including clinical trials.  4. Patients treated with COVID monoclonal antibody should continue to self-isolate and use infection control measures (e.g., wear mask, isolate, social distance, avoid sharing personal items, clean and disinfect "high touch" surfaces, and frequent handwashing) according to CDC guidelines.   5. The patient or parent/caregiver has the option to accept or refuse COVID monoclonal antibody treatment.  After  reviewing this information with the patient, the patient has agreed to receive one of the available covid 19 monoclonal antibodies and will be provided an appropriate fact sheet prior to infusion. Trinidad Curet, NP 03/25/2020 2:28 PM

## 2020-03-26 ENCOUNTER — Ambulatory Visit (HOSPITAL_COMMUNITY)
Admission: RE | Admit: 2020-03-26 | Discharge: 2020-03-26 | Disposition: A | Discharge status: Home / Self Care | Payer: Medicare Other | Source: Ambulatory Visit | Attending: Pulmonary Disease | Admitting: Pulmonary Disease

## 2020-03-26 DIAGNOSIS — R110 Nausea: Secondary | ICD-10-CM | POA: Diagnosis not present

## 2020-03-26 DIAGNOSIS — U071 COVID-19: Secondary | ICD-10-CM | POA: Diagnosis not present

## 2020-03-26 MED ORDER — METHYLPREDNISOLONE SODIUM SUCC 125 MG IJ SOLR: 125.0000 mg | Freq: Once | INTRAMUSCULAR | Status: DC | PRN

## 2020-03-26 MED ORDER — ALBUTEROL SULFATE HFA 108 (90 BASE) MCG/ACT IN AERS: 2.0000 | INHALATION_SPRAY | Freq: Once | RESPIRATORY_TRACT | Status: DC | PRN

## 2020-03-26 MED ORDER — DIPHENHYDRAMINE HCL 50 MG/ML IJ SOLN: 50.0000 mg | Freq: Once | INTRAMUSCULAR | Status: DC | PRN

## 2020-03-26 MED ORDER — EPINEPHRINE 0.3 MG/0.3ML IJ SOAJ: 0.3000 mg | Freq: Once | INTRAMUSCULAR | Status: DC | PRN

## 2020-03-26 MED ORDER — SODIUM CHLORIDE 0.9 % IV SOLN: INTRAVENOUS | Status: DC | PRN

## 2020-03-26 MED ORDER — SOTROVIMAB 500 MG/8ML IV SOLN
500.0000 mg | Freq: Once | INTRAVENOUS | Status: AC
  Administered 2020-03-26: 500 mg via INTRAVENOUS

## 2020-03-26 MED ORDER — FAMOTIDINE IN NACL 20-0.9 MG/50ML-% IV SOLN: 20.0000 mg | Freq: Once | INTRAVENOUS | Status: DC | PRN

## 2020-03-26 NOTE — Progress Notes (Signed)
Patient reviewed Fact Sheet for Patients, Parents, and Caregivers for Emergency Use Authorization (EUA) of sotrovimab for the Treatment of Coronavirus. Patient also reviewed and is agreeable to the estimated cost of treatment. Patient is agreeable to proceed.   

## 2020-03-26 NOTE — Progress Notes (Signed)
Diagnosis: COVID-19  Physician: Dr. Patrick Wright  Procedure: [facility] Med: Sotrovimab infusion - Provided patient with sotrovimab fact sheet for patients, parents, and caregivers prior to infusion.   Complications: No immediate complications noted  Discharge: Discharged home    

## 2020-03-26 NOTE — Discharge Instructions (Addendum)
10 Things You Can Do to Manage Your COVID-19 Symptoms at Home If you have possible or confirmed COVID-19: 1. Stay home from work and school. And stay away from other public places. If you must go out, avoid using any kind of public transportation, ridesharing, or taxis. 2. Monitor your symptoms carefully. If your symptoms get worse, call your healthcare provider immediately. 3. Get rest and stay hydrated. 4. If you have a medical appointment, call the healthcare provider ahead of time and tell them that you have or may have COVID-19. 5. For medical emergencies, call 911 and notify the dispatch personnel that you have or may have COVID-19. 6. Cover your cough and sneezes with a tissue or use the inside of your elbow. 7. Wash your hands often with soap and water for at least 20 seconds or clean your hands with an alcohol-based hand sanitizer that contains at least 60% alcohol. 8. As much as possible, stay in a specific room and away from other people in your home. Also, you should use a separate bathroom, if available. If you need to be around other people in or outside of the home, wear a mask. 9. Avoid sharing personal items with other people in your household, like dishes, towels, and bedding. 10. Clean all surfaces that are touched often, like counters, tabletops, and doorknobs. Use household cleaning sprays or wipes according to the label instructions. cdc.gov/coronavirus 09/18/2018 This information is not intended to replace advice given to you by your health care provider. Make sure you discuss any questions you have with your health care provider. Document Revised: 02/20/2019 Document Reviewed: 02/20/2019 Elsevier Patient Education  2020 Elsevier Inc. What types of side effects do monoclonal antibody drugs cause?  Common side effects  In general, the more common side effects caused by monoclonal antibody drugs include: . Allergic reactions, such as hives or itching . Flu-like signs and  symptoms, including chills, fatigue, fever, and muscle aches and pains . Nausea, vomiting . Diarrhea . Skin rashes . Low blood pressure   The CDC is recommending patients who receive monoclonal antibody treatments wait at least 90 days before being vaccinated.  Currently, there are no data on the safety and efficacy of mRNA COVID-19 vaccines in persons who received monoclonal antibodies or convalescent plasma as part of COVID-19 treatment. Based on the estimated half-life of such therapies as well as evidence suggesting that reinfection is uncommon in the 90 days after initial infection, vaccination should be deferred for at least 90 days, as a precautionary measure until additional information becomes available, to avoid interference of the antibody treatment with vaccine-induced immune responses. If you have any questions or concerns after the infusion please call the Advanced Practice Provider on call at [phone]. This number is ONLY intended for your use regarding questions or concerns about the infusion post-treatment side-effects.  Please do not provide this number to others for use. For return to work notes please contact your primary care provider.   If someone you know is interested in receiving treatment please have them call the COVID hotline at [phone].   

## 2020-04-02 DIAGNOSIS — R059 Cough, unspecified: Secondary | ICD-10-CM | POA: Diagnosis not present

## 2020-04-02 DIAGNOSIS — J018 Other acute sinusitis: Secondary | ICD-10-CM | POA: Diagnosis not present

## 2020-04-02 DIAGNOSIS — G8929 Other chronic pain: Secondary | ICD-10-CM | POA: Diagnosis not present

## 2020-04-02 DIAGNOSIS — Z79899 Other long term (current) drug therapy: Secondary | ICD-10-CM | POA: Diagnosis not present

## 2020-04-02 DIAGNOSIS — M545 Low back pain, unspecified: Secondary | ICD-10-CM | POA: Diagnosis not present

## 2020-04-02 DIAGNOSIS — Z20822 Contact with and (suspected) exposure to covid-19: Secondary | ICD-10-CM | POA: Diagnosis not present

## 2020-04-06 ENCOUNTER — Other Ambulatory Visit: Payer: Self-pay | Admitting: Neurology

## 2020-04-09 ENCOUNTER — Other Ambulatory Visit: Payer: Self-pay | Admitting: Obstetrics & Gynecology

## 2020-04-09 ENCOUNTER — Other Ambulatory Visit: Payer: Self-pay | Admitting: Internal Medicine

## 2020-04-09 DIAGNOSIS — O0992 Supervision of high risk pregnancy, unspecified, second trimester: Secondary | ICD-10-CM

## 2020-04-13 ENCOUNTER — Other Ambulatory Visit: Payer: Self-pay | Admitting: Internal Medicine

## 2020-04-20 ENCOUNTER — Encounter: Payer: Self-pay | Admitting: Podiatry

## 2020-04-20 ENCOUNTER — Other Ambulatory Visit: Payer: Self-pay

## 2020-04-20 ENCOUNTER — Ambulatory Visit (INDEPENDENT_AMBULATORY_CARE_PROVIDER_SITE_OTHER): Payer: Medicare Other | Admitting: Podiatry

## 2020-04-20 ENCOUNTER — Ambulatory Visit: Payer: Medicare Other | Admitting: Orthotics

## 2020-04-20 DIAGNOSIS — E669 Obesity, unspecified: Secondary | ICD-10-CM

## 2020-04-20 DIAGNOSIS — L600 Ingrowing nail: Secondary | ICD-10-CM | POA: Insufficient documentation

## 2020-04-20 DIAGNOSIS — K219 Gastro-esophageal reflux disease without esophagitis: Secondary | ICD-10-CM | POA: Diagnosis not present

## 2020-04-20 DIAGNOSIS — E1169 Type 2 diabetes mellitus with other specified complication: Secondary | ICD-10-CM

## 2020-04-20 DIAGNOSIS — E119 Type 2 diabetes mellitus without complications: Secondary | ICD-10-CM | POA: Diagnosis not present

## 2020-04-20 DIAGNOSIS — K589 Irritable bowel syndrome without diarrhea: Secondary | ICD-10-CM | POA: Diagnosis not present

## 2020-04-20 NOTE — Progress Notes (Signed)
This patient presents to the office for painful ingrown nails both borders left big toe.  She says the nail is painful walking and wearing her shoes.  No drainage or infection.  She also is scheduled to be seen by [facility] for diabetic shoes.  She has diagnosed with diabetic neuropathy and is taking gabapentin.  General Appearance  Alert, conversant and in no acute stress.  Vascular  Dorsalis pedis and posterior tibial  pulses are palpable  bilaterally.  Capillary return is within normal limits  bilaterally. Temperature is within normal limits  bilaterally.  Neurologic  Senn-Weinstein monofilament wire test diminished  bilaterally. Muscle power within normal limits bilaterally.  Nails Ingrown toenail medial and lateral borders left great toenaol.. No evidence of bacterial infection or drainage bilaterally.  Orthopedic  No limitations of motion  feet .  No crepitus or effusions noted.  HAV  B/L.  Hammer toes 2-5  B/L.  Skin  normotropic skin with no porokeratosis noted bilaterally.  No signs of infections or ulcers noted  .Ingrown  Toenails medial and lateral borders  ROV.  Debride nail borders left hallux.  Diabetic foot exam has no vascular pathology.  LOPS diminished.  Patient qualifies for diabetic shoes due to DPN, HAV  B/L and hammer toes B/L.  RTC prn   Gardiner Barefoot DPM

## 2020-04-22 DIAGNOSIS — E039 Hypothyroidism, unspecified: Secondary | ICD-10-CM | POA: Diagnosis not present

## 2020-04-22 DIAGNOSIS — E559 Vitamin D deficiency, unspecified: Secondary | ICD-10-CM | POA: Diagnosis not present

## 2020-04-22 DIAGNOSIS — E1140 Type 2 diabetes mellitus with diabetic neuropathy, unspecified: Secondary | ICD-10-CM | POA: Diagnosis not present

## 2020-04-22 DIAGNOSIS — Z9884 Bariatric surgery status: Secondary | ICD-10-CM | POA: Diagnosis not present

## 2020-04-22 DIAGNOSIS — Z6824 Body mass index (BMI) 24.0-24.9, adult: Secondary | ICD-10-CM | POA: Diagnosis not present

## 2020-04-22 DIAGNOSIS — Z85238 Personal history of other malignant neoplasm of thymus: Secondary | ICD-10-CM | POA: Diagnosis not present

## 2020-04-22 DIAGNOSIS — G7000 Myasthenia gravis without (acute) exacerbation: Secondary | ICD-10-CM | POA: Diagnosis not present

## 2020-04-26 NOTE — Progress Notes (Signed)

## 2020-04-27 ENCOUNTER — Encounter: Payer: Self-pay | Admitting: Internal Medicine

## 2020-04-27 MED ORDER — VALACYCLOVIR HCL 500 MG PO TABS: 500.0000 mg | ORAL_TABLET | Freq: Two times a day (BID) | ORAL | 0 refills | Status: AC

## 2020-05-03 DIAGNOSIS — G8929 Other chronic pain: Secondary | ICD-10-CM | POA: Diagnosis not present

## 2020-05-03 DIAGNOSIS — M545 Low back pain, unspecified: Secondary | ICD-10-CM | POA: Diagnosis not present

## 2020-05-03 DIAGNOSIS — M62830 Muscle spasm of back: Secondary | ICD-10-CM | POA: Diagnosis not present

## 2020-05-03 DIAGNOSIS — Z79899 Other long term (current) drug therapy: Secondary | ICD-10-CM | POA: Diagnosis not present

## 2020-05-12 ENCOUNTER — Telehealth: Payer: Self-pay | Admitting: Internal Medicine

## 2020-05-12 NOTE — Progress Notes (Signed)
  Chronic Care Management   Outreach Note  05/12/2020 Name: Jade Mathis MRN: [id number] DOB: May 27, 1975  Referred by: Hoyt Koch, MD Reason for referral : No chief complaint on file.   An unsuccessful telephone outreach was attempted today. The patient was referred to the pharmacist for assistance with care management and care coordination.   Follow Up Plan:   Carley Perdue [employer] Scheduler

## 2020-05-18 DIAGNOSIS — K589 Irritable bowel syndrome without diarrhea: Secondary | ICD-10-CM | POA: Diagnosis not present

## 2020-05-18 DIAGNOSIS — E119 Type 2 diabetes mellitus without complications: Secondary | ICD-10-CM | POA: Diagnosis not present

## 2020-05-18 DIAGNOSIS — K219 Gastro-esophageal reflux disease without esophagitis: Secondary | ICD-10-CM | POA: Diagnosis not present

## 2020-05-26 DIAGNOSIS — Z85238 Personal history of other malignant neoplasm of thymus: Secondary | ICD-10-CM | POA: Diagnosis not present

## 2020-05-26 DIAGNOSIS — Z6824 Body mass index (BMI) 24.0-24.9, adult: Secondary | ICD-10-CM | POA: Diagnosis not present

## 2020-05-26 DIAGNOSIS — E559 Vitamin D deficiency, unspecified: Secondary | ICD-10-CM | POA: Diagnosis not present

## 2020-05-26 DIAGNOSIS — Z9884 Bariatric surgery status: Secondary | ICD-10-CM | POA: Diagnosis not present

## 2020-05-26 DIAGNOSIS — G7000 Myasthenia gravis without (acute) exacerbation: Secondary | ICD-10-CM | POA: Diagnosis not present

## 2020-05-26 DIAGNOSIS — E1140 Type 2 diabetes mellitus with diabetic neuropathy, unspecified: Secondary | ICD-10-CM | POA: Diagnosis not present

## 2020-05-26 DIAGNOSIS — E039 Hypothyroidism, unspecified: Secondary | ICD-10-CM | POA: Diagnosis not present

## 2020-05-28 DIAGNOSIS — Z79899 Other long term (current) drug therapy: Secondary | ICD-10-CM | POA: Diagnosis not present

## 2020-05-28 DIAGNOSIS — R5383 Other fatigue: Secondary | ICD-10-CM | POA: Diagnosis not present

## 2020-05-28 DIAGNOSIS — E119 Type 2 diabetes mellitus without complications: Secondary | ICD-10-CM | POA: Diagnosis not present

## 2020-05-28 DIAGNOSIS — E559 Vitamin D deficiency, unspecified: Secondary | ICD-10-CM | POA: Diagnosis not present

## 2020-05-28 DIAGNOSIS — R0602 Shortness of breath: Secondary | ICD-10-CM | POA: Diagnosis not present

## 2020-05-31 DIAGNOSIS — Z79899 Other long term (current) drug therapy: Secondary | ICD-10-CM | POA: Diagnosis not present

## 2020-05-31 DIAGNOSIS — M545 Low back pain, unspecified: Secondary | ICD-10-CM | POA: Diagnosis not present

## 2020-05-31 DIAGNOSIS — L299 Pruritus, unspecified: Secondary | ICD-10-CM | POA: Diagnosis not present

## 2020-05-31 DIAGNOSIS — G8929 Other chronic pain: Secondary | ICD-10-CM | POA: Diagnosis not present

## 2020-06-07 ENCOUNTER — Ambulatory Visit (INDEPENDENT_AMBULATORY_CARE_PROVIDER_SITE_OTHER): Payer: Medicare Other | Admitting: *Deleted

## 2020-06-07 ENCOUNTER — Other Ambulatory Visit: Payer: Self-pay

## 2020-06-07 DIAGNOSIS — E0842 Diabetes mellitus due to underlying condition with diabetic polyneuropathy: Secondary | ICD-10-CM

## 2020-06-07 NOTE — Progress Notes (Signed)
Patient presents today to pick up diabetic shoes and insoles.  Shoes and insoles were dispensed. She felt the shoe was too tight at the tip of her toes.   Initial shoe order:  Or[employer] 987 women's 10 wide  These shoes will be sent back.   Reorder: [employer]pex A8100 women's 10.5 wide   Order was placed today by [facility] and will notify the patient once they arrive.

## 2020-06-18 DIAGNOSIS — K219 Gastro-esophageal reflux disease without esophagitis: Secondary | ICD-10-CM | POA: Diagnosis not present

## 2020-06-18 DIAGNOSIS — K589 Irritable bowel syndrome without diarrhea: Secondary | ICD-10-CM | POA: Diagnosis not present

## 2020-06-18 DIAGNOSIS — E119 Type 2 diabetes mellitus without complications: Secondary | ICD-10-CM | POA: Diagnosis not present

## 2020-06-22 ENCOUNTER — Other Ambulatory Visit: Payer: Self-pay

## 2020-06-22 ENCOUNTER — Ambulatory Visit (INDEPENDENT_AMBULATORY_CARE_PROVIDER_SITE_OTHER): Payer: Medicare Other | Admitting: Podiatry

## 2020-06-22 ENCOUNTER — Encounter: Payer: Self-pay | Admitting: Podiatry

## 2020-06-22 DIAGNOSIS — M79675 Pain in left toe(s): Secondary | ICD-10-CM

## 2020-06-22 DIAGNOSIS — E0842 Diabetes mellitus due to underlying condition with diabetic polyneuropathy: Secondary | ICD-10-CM

## 2020-06-22 DIAGNOSIS — M79674 Pain in right toe(s): Secondary | ICD-10-CM

## 2020-06-22 NOTE — Progress Notes (Signed)
The patient presented to the office today to pick up diabetic shoes and 3 pair of diabetic custom inserts.  1 pair of the inserts were put in the shoes and the shoes did not fit as they were too big.  Patient stated to me that she was in here a few weeks ago to get measured and the girl measured her and stated that she was a 10 1/2 wide and the patient stated to the girl that she wore a men's 9 medium.  I stated to the patient that I would order a new size and that patient would be contacted when the shoes were ready for pick up.

## 2020-06-25 DIAGNOSIS — E559 Vitamin D deficiency, unspecified: Secondary | ICD-10-CM | POA: Diagnosis not present

## 2020-06-25 DIAGNOSIS — E119 Type 2 diabetes mellitus without complications: Secondary | ICD-10-CM | POA: Diagnosis not present

## 2020-06-25 DIAGNOSIS — R5383 Other fatigue: Secondary | ICD-10-CM | POA: Diagnosis not present

## 2020-06-30 ENCOUNTER — Other Ambulatory Visit: Payer: Self-pay | Admitting: Neurology

## 2020-06-30 DIAGNOSIS — Z79891 Long term (current) use of opiate analgesic: Secondary | ICD-10-CM | POA: Diagnosis not present

## 2020-06-30 DIAGNOSIS — Z79899 Other long term (current) drug therapy: Secondary | ICD-10-CM | POA: Diagnosis not present

## 2020-06-30 DIAGNOSIS — M545 Low back pain, unspecified: Secondary | ICD-10-CM | POA: Diagnosis not present

## 2020-06-30 DIAGNOSIS — G8929 Other chronic pain: Secondary | ICD-10-CM | POA: Diagnosis not present

## 2020-07-05 ENCOUNTER — Other Ambulatory Visit: Payer: Self-pay | Admitting: Neurology

## 2020-07-06 DIAGNOSIS — E782 Mixed hyperlipidemia: Secondary | ICD-10-CM | POA: Diagnosis not present

## 2020-07-06 DIAGNOSIS — K912 Postsurgical malabsorption, not elsewhere classified: Secondary | ICD-10-CM | POA: Diagnosis not present

## 2020-07-06 DIAGNOSIS — Z7952 Long term (current) use of systemic steroids: Secondary | ICD-10-CM | POA: Diagnosis not present

## 2020-07-06 DIAGNOSIS — M5030 Other cervical disc degeneration, unspecified cervical region: Secondary | ICD-10-CM | POA: Diagnosis not present

## 2020-07-06 DIAGNOSIS — M064 Inflammatory polyarthropathy: Secondary | ICD-10-CM | POA: Diagnosis not present

## 2020-07-06 DIAGNOSIS — Z903 Acquired absence of stomach [part of]: Secondary | ICD-10-CM | POA: Diagnosis not present

## 2020-07-06 DIAGNOSIS — E119 Type 2 diabetes mellitus without complications: Secondary | ICD-10-CM | POA: Diagnosis not present

## 2020-07-06 DIAGNOSIS — D84821 Immunodeficiency due to drugs: Secondary | ICD-10-CM | POA: Diagnosis not present

## 2020-07-06 DIAGNOSIS — G4733 Obstructive sleep apnea (adult) (pediatric): Secondary | ICD-10-CM | POA: Diagnosis not present

## 2020-07-06 DIAGNOSIS — Z9225 Personal history of immunosupression therapy: Secondary | ICD-10-CM | POA: Diagnosis not present

## 2020-07-06 DIAGNOSIS — E039 Hypothyroidism, unspecified: Secondary | ICD-10-CM | POA: Diagnosis not present

## 2020-07-19 DIAGNOSIS — K589 Irritable bowel syndrome without diarrhea: Secondary | ICD-10-CM | POA: Diagnosis not present

## 2020-07-19 DIAGNOSIS — E119 Type 2 diabetes mellitus without complications: Secondary | ICD-10-CM | POA: Diagnosis not present

## 2020-07-19 DIAGNOSIS — K219 Gastro-esophageal reflux disease without esophagitis: Secondary | ICD-10-CM | POA: Diagnosis not present

## 2020-07-24 DIAGNOSIS — E559 Vitamin D deficiency, unspecified: Secondary | ICD-10-CM | POA: Diagnosis not present

## 2020-07-24 DIAGNOSIS — R5383 Other fatigue: Secondary | ICD-10-CM | POA: Diagnosis not present

## 2020-07-24 DIAGNOSIS — Z79899 Other long term (current) drug therapy: Secondary | ICD-10-CM | POA: Diagnosis not present

## 2020-07-24 DIAGNOSIS — R030 Elevated blood-pressure reading, without diagnosis of hypertension: Secondary | ICD-10-CM | POA: Diagnosis not present

## 2020-07-24 DIAGNOSIS — E119 Type 2 diabetes mellitus without complications: Secondary | ICD-10-CM | POA: Diagnosis not present

## 2020-07-24 DIAGNOSIS — Z20822 Contact with and (suspected) exposure to covid-19: Secondary | ICD-10-CM | POA: Diagnosis not present

## 2020-07-26 ENCOUNTER — Telehealth: Payer: Self-pay

## 2020-07-26 MED ORDER — PREDNISONE 10 MG PO TABS: 10.0000 mg | ORAL_TABLET | Freq: Every day | ORAL | 0 refills | Status: DC

## 2020-07-26 NOTE — Telephone Encounter (Signed)
30 days sent

## 2020-07-29 ENCOUNTER — Telehealth: Payer: Self-pay

## 2020-07-29 NOTE — Telephone Encounter (Signed)
AWV not  Schedule pt declined service.

## 2020-07-30 ENCOUNTER — Telehealth: Payer: Self-pay | Admitting: Podiatry

## 2020-07-30 DIAGNOSIS — G8929 Other chronic pain: Secondary | ICD-10-CM | POA: Diagnosis not present

## 2020-07-30 DIAGNOSIS — M545 Low back pain, unspecified: Secondary | ICD-10-CM | POA: Diagnosis not present

## 2020-07-30 DIAGNOSIS — Z79899 Other long term (current) drug therapy: Secondary | ICD-10-CM | POA: Diagnosis not present

## 2020-07-30 DIAGNOSIS — Z79891 Long term (current) use of opiate analgesic: Secondary | ICD-10-CM | POA: Diagnosis not present

## 2020-07-30 NOTE — Telephone Encounter (Signed)
Re ordered diabetic shoes/inserts in.. lvm for pt to call to schedule an appt to pick them up on or before 6.9 as authorization will expire.

## 2020-08-05 DIAGNOSIS — Z9884 Bariatric surgery status: Secondary | ICD-10-CM | POA: Diagnosis not present

## 2020-08-05 DIAGNOSIS — E039 Hypothyroidism, unspecified: Secondary | ICD-10-CM | POA: Diagnosis not present

## 2020-08-05 DIAGNOSIS — E1140 Type 2 diabetes mellitus with diabetic neuropathy, unspecified: Secondary | ICD-10-CM | POA: Diagnosis not present

## 2020-08-05 DIAGNOSIS — G7000 Myasthenia gravis without (acute) exacerbation: Secondary | ICD-10-CM | POA: Diagnosis not present

## 2020-08-10 ENCOUNTER — Other Ambulatory Visit: Payer: Self-pay

## 2020-08-10 ENCOUNTER — Encounter: Payer: Self-pay | Admitting: Podiatry

## 2020-08-10 ENCOUNTER — Ambulatory Visit (INDEPENDENT_AMBULATORY_CARE_PROVIDER_SITE_OTHER): Payer: Medicare Other | Admitting: Podiatry

## 2020-08-10 DIAGNOSIS — M2042 Other hammer toe(s) (acquired), left foot: Secondary | ICD-10-CM | POA: Diagnosis not present

## 2020-08-10 DIAGNOSIS — M2041 Other hammer toe(s) (acquired), right foot: Secondary | ICD-10-CM | POA: Diagnosis not present

## 2020-08-10 DIAGNOSIS — M2011 Hallux valgus (acquired), right foot: Secondary | ICD-10-CM | POA: Diagnosis not present

## 2020-08-10 DIAGNOSIS — M2012 Hallux valgus (acquired), left foot: Secondary | ICD-10-CM | POA: Diagnosis not present

## 2020-08-10 DIAGNOSIS — E0842 Diabetes mellitus due to underlying condition with diabetic polyneuropathy: Secondary | ICD-10-CM

## 2020-08-10 DIAGNOSIS — E1142 Type 2 diabetes mellitus with diabetic polyneuropathy: Secondary | ICD-10-CM | POA: Diagnosis not present

## 2020-08-10 DIAGNOSIS — M79675 Pain in left toe(s): Secondary | ICD-10-CM

## 2020-08-10 DIAGNOSIS — M79674 Pain in right toe(s): Secondary | ICD-10-CM

## 2020-08-10 NOTE — Progress Notes (Addendum)
Patient presented to the office today to pick up diabetic shoes and 3 pair of diabetic custom inserts.  1 pair of inserts were put in the shoes and the shoes were fitted to the patient. The patient states they are comfortable and free of defect. She was satisfied with the fit of the shoe. Instructions for break in and wear were dispensed. The patient signed the delivery documentation and break in instruction form.  Patient is to contact the office if any concerns or questions.  Gardiner Barefoot DPM

## 2020-08-12 ENCOUNTER — Encounter: Payer: Self-pay | Admitting: Internal Medicine

## 2020-08-13 ENCOUNTER — Encounter (HOSPITAL_COMMUNITY): Payer: Self-pay | Admitting: Emergency Medicine

## 2020-08-13 ENCOUNTER — Inpatient Hospital Stay (HOSPITAL_COMMUNITY)
Admission: EM | Admit: 2020-08-13 | Discharge: 2020-08-16 | DRG: 057 | Disposition: A | Discharge status: Home / Self Care | Payer: Medicare Other | Attending: Internal Medicine | Admitting: Internal Medicine

## 2020-08-13 ENCOUNTER — Other Ambulatory Visit: Payer: Self-pay

## 2020-08-13 ENCOUNTER — Emergency Department (HOSPITAL_COMMUNITY): Payer: Medicare Other

## 2020-08-13 DIAGNOSIS — Z9049 Acquired absence of other specified parts of digestive tract: Secondary | ICD-10-CM

## 2020-08-13 DIAGNOSIS — M797 Fibromyalgia: Secondary | ICD-10-CM | POA: Diagnosis not present

## 2020-08-13 DIAGNOSIS — Z6824 Body mass index (BMI) 24.0-24.9, adult: Secondary | ICD-10-CM

## 2020-08-13 DIAGNOSIS — E890 Postprocedural hypothyroidism: Secondary | ICD-10-CM | POA: Diagnosis present

## 2020-08-13 DIAGNOSIS — E1169 Type 2 diabetes mellitus with other specified complication: Secondary | ICD-10-CM | POA: Diagnosis not present

## 2020-08-13 DIAGNOSIS — E876 Hypokalemia: Secondary | ICD-10-CM | POA: Diagnosis not present

## 2020-08-13 DIAGNOSIS — Z7952 Long term (current) use of systemic steroids: Secondary | ICD-10-CM | POA: Diagnosis not present

## 2020-08-13 DIAGNOSIS — E669 Obesity, unspecified: Secondary | ICD-10-CM | POA: Diagnosis present

## 2020-08-13 DIAGNOSIS — G4700 Insomnia, unspecified: Secondary | ICD-10-CM | POA: Diagnosis present

## 2020-08-13 DIAGNOSIS — Z8709 Personal history of other diseases of the respiratory system: Secondary | ICD-10-CM

## 2020-08-13 DIAGNOSIS — K219 Gastro-esophageal reflux disease without esophagitis: Secondary | ICD-10-CM | POA: Diagnosis not present

## 2020-08-13 DIAGNOSIS — G4733 Obstructive sleep apnea (adult) (pediatric): Secondary | ICD-10-CM | POA: Diagnosis not present

## 2020-08-13 DIAGNOSIS — Z8616 Personal history of COVID-19: Secondary | ICD-10-CM | POA: Diagnosis not present

## 2020-08-13 DIAGNOSIS — Z7984 Long term (current) use of oral hypoglycemic drugs: Secondary | ICD-10-CM

## 2020-08-13 DIAGNOSIS — E785 Hyperlipidemia, unspecified: Secondary | ICD-10-CM | POA: Diagnosis not present

## 2020-08-13 DIAGNOSIS — Z79899 Other long term (current) drug therapy: Secondary | ICD-10-CM | POA: Diagnosis not present

## 2020-08-13 DIAGNOSIS — Z794 Long term (current) use of insulin: Secondary | ICD-10-CM

## 2020-08-13 DIAGNOSIS — H9041 Sensorineural hearing loss, unilateral, right ear, with unrestricted hearing on the contralateral side: Secondary | ICD-10-CM | POA: Diagnosis present

## 2020-08-13 DIAGNOSIS — R21 Rash and other nonspecific skin eruption: Secondary | ICD-10-CM | POA: Diagnosis not present

## 2020-08-13 DIAGNOSIS — Z833 Family history of diabetes mellitus: Secondary | ICD-10-CM

## 2020-08-13 DIAGNOSIS — I959 Hypotension, unspecified: Secondary | ICD-10-CM | POA: Diagnosis not present

## 2020-08-13 DIAGNOSIS — Z79890 Hormone replacement therapy (postmenopausal): Secondary | ICD-10-CM

## 2020-08-13 DIAGNOSIS — E119 Type 2 diabetes mellitus without complications: Secondary | ICD-10-CM | POA: Diagnosis not present

## 2020-08-13 DIAGNOSIS — R059 Cough, unspecified: Secondary | ICD-10-CM | POA: Diagnosis not present

## 2020-08-13 DIAGNOSIS — Z20822 Contact with and (suspected) exposure to covid-19: Secondary | ICD-10-CM | POA: Diagnosis not present

## 2020-08-13 DIAGNOSIS — Z79891 Long term (current) use of opiate analgesic: Secondary | ICD-10-CM

## 2020-08-13 DIAGNOSIS — G43909 Migraine, unspecified, not intractable, without status migrainosus: Secondary | ICD-10-CM | POA: Diagnosis not present

## 2020-08-13 DIAGNOSIS — F319 Bipolar disorder, unspecified: Secondary | ICD-10-CM | POA: Diagnosis present

## 2020-08-13 DIAGNOSIS — R0602 Shortness of breath: Secondary | ICD-10-CM | POA: Diagnosis not present

## 2020-08-13 DIAGNOSIS — E282 Polycystic ovarian syndrome: Secondary | ICD-10-CM | POA: Diagnosis present

## 2020-08-13 DIAGNOSIS — F419 Anxiety disorder, unspecified: Secondary | ICD-10-CM | POA: Diagnosis present

## 2020-08-13 DIAGNOSIS — J45909 Unspecified asthma, uncomplicated: Secondary | ICD-10-CM | POA: Diagnosis present

## 2020-08-13 DIAGNOSIS — G7000 Myasthenia gravis without (acute) exacerbation: Secondary | ICD-10-CM | POA: Diagnosis not present

## 2020-08-13 DIAGNOSIS — Z885 Allergy status to narcotic agent status: Secondary | ICD-10-CM

## 2020-08-13 DIAGNOSIS — Z7951 Long term (current) use of inhaled steroids: Secondary | ICD-10-CM

## 2020-08-13 DIAGNOSIS — Z888 Allergy status to other drugs, medicaments and biological substances status: Secondary | ICD-10-CM

## 2020-08-13 DIAGNOSIS — G7001 Myasthenia gravis with (acute) exacerbation: Secondary | ICD-10-CM | POA: Diagnosis not present

## 2020-08-13 LAB — CBC WITH DIFFERENTIAL/PLATELET
Abs Immature Granulocytes: 0.01 10*3/uL (ref 0.00–0.07)
Basophils Absolute: 0 10*3/uL (ref 0.0–0.1)
Basophils Relative: 1 %
Eosinophils Absolute: 0.2 10*3/uL (ref 0.0–0.5)
Eosinophils Relative: 4 %
HCT: 37.9 % (ref 36.0–46.0)
Hemoglobin: 11.9 g/dL — ABNORMAL LOW (ref 12.0–15.0)
Immature Granulocytes: 0 %
Lymphocytes Relative: 38 %
Lymphs Abs: 1.7 10*3/uL (ref 0.7–4.0)
MCH: 26.9 pg (ref 26.0–34.0)
MCHC: 31.4 g/dL (ref 30.0–36.0)
MCV: 85.6 fL (ref 80.0–100.0)
Monocytes Absolute: 0.4 10*3/uL (ref 0.1–1.0)
Monocytes Relative: 8 %
Neutro Abs: 2.3 10*3/uL (ref 1.7–7.7)
Neutrophils Relative %: 49 %
Platelets: 251 10*3/uL (ref 150–400)
RBC: 4.43 MIL/uL (ref 3.87–5.11)
RDW: 12.7 % (ref 11.5–15.5)
WBC: 4.6 10*3/uL (ref 4.0–10.5)
nRBC: 0 % (ref 0.0–0.2)

## 2020-08-13 LAB — BASIC METABOLIC PANEL
Anion gap: 8 (ref 5–15)
BUN: 16 mg/dL (ref 6–20)
CO2: 27 mmol/L (ref 22–32)
Calcium: 8.8 mg/dL — ABNORMAL LOW (ref 8.9–10.3)
Chloride: 102 mmol/L (ref 98–111)
Creatinine, Ser: 0.63 mg/dL (ref 0.44–1.00)
GFR, Estimated: >60 mL/min (ref >60)
Glucose, Bld: 109 mg/dL — ABNORMAL HIGH (ref 70–99)
Potassium: 3 mmol/L — ABNORMAL LOW (ref 3.5–5.1)
Sodium: 137 mmol/L (ref 135–145)

## 2020-08-13 LAB — I-STAT BETA HCG BLOOD, ED (MC, WL, AP ONLY): I-stat hCG, quantitative: <5 m[IU]/mL (ref <5)

## 2020-08-13 LAB — TROPONIN I (HIGH SENSITIVITY): Troponin I (High Sensitivity): <2 ng/L (ref <18)

## 2020-08-13 NOTE — ED Triage Notes (Signed)
Patient reports persistent asthma with chest tightness/SOB unrelieved by home nebulizer treatment . No fever or chills .

## 2020-08-13 NOTE — ED Provider Notes (Addendum)
Emergency Medicine Provider Triage Evaluation Note  Jade Mathis , a 45 y.o. female  was evaluated in triage.  Pt complains of shortness of breath.  Over the last week has been using her nebulizers multiple times a day.  She is on chronic prednisone.  Has been hospitalized previously for asthma.  She now has chest pain that does not radiate to left arm, left back or jaw.  No lower extremity swelling.  No prior history of blood clots.  Patient states today she noted congestion and rhinorrhea.  She has associated frontal headache.  No sudden onset thunderclap headache.  No paresthesias, weakness, vision changes  Vaccinated for COVID., No know exposures  Review of Systems  Positive: Headache, congestion, rhinorrhea, chest pain, shortness of breath Negative: Fever, emesis, abdominal pain, paresthesias or weakness.  Physical Exam  BP 113/75 (BP Location: Left Arm)   Pulse 85   Temp 98.4 F (36.9 C) (Oral)   Resp 20   SpO2 100%  Gen:   Awake, no distress   Resp:  Normal effort, expiratory wheeze MSK:   Moves all extremities without difficulty  SKIN:  No lower extremity edema Neuro:  No facial droop, moves all 4 extremities without difficulty Equal handgrip bilaterally Other:    Medical Decision Making  Medically screening exam initiated at 9:46 PM.  Appropriate orders placed.  Kimm Latise Battin was informed that the remainder of the evaluation will be completed by another provider, this initial triage assessment does not replace that evaluation, and the importance of remaining in the ED until their evaluation is complete.  Headache, shortness of breath, chest pain, congestion, rhinorrhea       Eryx Zane A, PA-C 08/13/20 2148    Breck Coons, MD 08/13/20 2326

## 2020-08-14 DIAGNOSIS — G7001 Myasthenia gravis with (acute) exacerbation: Secondary | ICD-10-CM | POA: Diagnosis present

## 2020-08-14 DIAGNOSIS — G43909 Migraine, unspecified, not intractable, without status migrainosus: Secondary | ICD-10-CM

## 2020-08-14 DIAGNOSIS — E119 Type 2 diabetes mellitus without complications: Secondary | ICD-10-CM | POA: Diagnosis not present

## 2020-08-14 DIAGNOSIS — M797 Fibromyalgia: Secondary | ICD-10-CM

## 2020-08-14 DIAGNOSIS — E890 Postprocedural hypothyroidism: Secondary | ICD-10-CM | POA: Diagnosis not present

## 2020-08-14 DIAGNOSIS — G7000 Myasthenia gravis without (acute) exacerbation: Secondary | ICD-10-CM | POA: Diagnosis not present

## 2020-08-14 DIAGNOSIS — E876 Hypokalemia: Secondary | ICD-10-CM | POA: Diagnosis not present

## 2020-08-14 LAB — I-STAT VENOUS BLOOD GAS, ED
Acid-Base Excess: 5 mmol/L — ABNORMAL HIGH (ref 0.0–2.0)
Bicarbonate: 24.8 mmol/L (ref 20.0–28.0)
Calcium, Ion: 0.75 mmol/L — CL (ref 1.15–1.40)
HCT: 38 % (ref 36.0–46.0)
Hemoglobin: 12.9 g/dL (ref 12.0–15.0)
O2 Saturation: 97 %
Patient temperature: 37
Potassium: 4.2 mmol/L (ref 3.5–5.1)
Sodium: 136 mmol/L (ref 135–145)
TCO2: 25 mmol/L (ref 22–32)
pCO2, Ven: 22.9 mmHg — ABNORMAL LOW (ref 44.0–60.0)
pH, Ven: 7.642 (ref 7.250–7.430)
pO2, Ven: 67 mmHg — ABNORMAL HIGH (ref 32.0–45.0)

## 2020-08-14 LAB — MAGNESIUM: Magnesium: 2.1 mg/dL (ref 1.7–2.4)

## 2020-08-14 LAB — TSH: TSH: 0.124 u[IU]/mL — ABNORMAL LOW (ref 0.350–4.500)

## 2020-08-14 LAB — URINALYSIS, ROUTINE W REFLEX MICROSCOPIC
Bilirubin Urine: NEGATIVE
Glucose, UA: >=500 mg/dL — AB
Hgb urine dipstick: NEGATIVE
Ketones, ur: NEGATIVE mg/dL
Nitrite: NEGATIVE
Protein, ur: NEGATIVE mg/dL
Specific Gravity, Urine: 1.043 — ABNORMAL HIGH (ref 1.005–1.030)
pH: 6 (ref 5.0–8.0)

## 2020-08-14 LAB — TROPONIN I (HIGH SENSITIVITY): Troponin I (High Sensitivity): <2 ng/L (ref <18)

## 2020-08-14 LAB — D-DIMER, QUANTITATIVE: D-Dimer, Quant: 0.5 ug/mL-FEU (ref 0.00–0.50)

## 2020-08-14 LAB — SARS CORONAVIRUS 2 (TAT 6-24 HRS): SARS Coronavirus 2: NEGATIVE

## 2020-08-14 LAB — CBG MONITORING, ED
Glucose-Capillary: 117 mg/dL — ABNORMAL HIGH (ref 70–99)
Glucose-Capillary: 144 mg/dL — ABNORMAL HIGH (ref 70–99)

## 2020-08-14 LAB — GLUCOSE, CAPILLARY
Glucose-Capillary: 113 mg/dL — ABNORMAL HIGH (ref 70–99)
Glucose-Capillary: 147 mg/dL — ABNORMAL HIGH (ref 70–99)

## 2020-08-14 LAB — HIV ANTIBODY (ROUTINE TESTING W REFLEX): HIV Screen 4th Generation wRfx: NONREACTIVE

## 2020-08-14 MED ORDER — IPRATROPIUM-ALBUTEROL 0.5-2.5 (3) MG/3ML IN SOLN
3.0000 mL | Freq: Two times a day (BID) | RESPIRATORY_TRACT | Status: DC
  Administered 2020-08-14 – 2020-08-16 (×4): 3 mL via RESPIRATORY_TRACT
  Filled 2020-08-14 (×4): qty 3

## 2020-08-14 MED ORDER — IPRATROPIUM-ALBUTEROL 0.5-2.5 (3) MG/3ML IN SOLN
3.0000 mL | Freq: Once | RESPIRATORY_TRACT | Status: AC
  Administered 2020-08-14: 3 mL via RESPIRATORY_TRACT
  Filled 2020-08-14: qty 3

## 2020-08-14 MED ORDER — ACETAMINOPHEN 325 MG PO TABS: 650.0000 mg | ORAL_TABLET | Freq: Four times a day (QID) | ORAL | Status: DC | PRN

## 2020-08-14 MED ORDER — IMMUNE GLOBULIN (HUMAN) 10 GM/100ML IV SOLN
400.0000 mg/kg | INTRAVENOUS | Status: DC
  Administered 2020-08-14: 25 g via INTRAVENOUS
  Filled 2020-08-14: qty 250
  Filled 2020-08-14: qty 200

## 2020-08-14 MED ORDER — ACETAMINOPHEN 650 MG RE SUPP: 650.0000 mg | Freq: Four times a day (QID) | RECTAL | Status: DC | PRN

## 2020-08-14 MED ORDER — GABAPENTIN 400 MG PO CAPS
400.0000 mg | ORAL_CAPSULE | Freq: Three times a day (TID) | ORAL | Status: DC
  Administered 2020-08-14 – 2020-08-16 (×6): 400 mg via ORAL
  Filled 2020-08-14 (×6): qty 1

## 2020-08-14 MED ORDER — ENOXAPARIN SODIUM 40 MG/0.4ML IJ SOSY
40.0000 mg | PREFILLED_SYRINGE | INTRAMUSCULAR | Status: DC
  Administered 2020-08-14 – 2020-08-15 (×2): 40 mg via SUBCUTANEOUS
  Filled 2020-08-14 (×2): qty 0.4

## 2020-08-14 MED ORDER — PREDNISONE 5 MG PO TABS
10.0000 mg | ORAL_TABLET | Freq: Every day | ORAL | Status: DC
  Administered 2020-08-15 – 2020-08-16 (×2): 10 mg via ORAL
  Filled 2020-08-14 (×2): qty 2
  Filled 2020-08-14: qty 1

## 2020-08-14 MED ORDER — QUETIAPINE FUMARATE 200 MG PO TABS
200.0000 mg | ORAL_TABLET | Freq: Every day | ORAL | Status: DC
  Administered 2020-08-14 – 2020-08-15 (×2): 200 mg via ORAL
  Filled 2020-08-14 (×2): qty 1

## 2020-08-14 MED ORDER — TRAZODONE HCL 100 MG PO TABS
100.0000 mg | ORAL_TABLET | Freq: Every day | ORAL | Status: DC
  Administered 2020-08-14 – 2020-08-15 (×2): 100 mg via ORAL
  Filled 2020-08-14 (×2): qty 1

## 2020-08-14 MED ORDER — LORATADINE 10 MG PO TABS
10.0000 mg | ORAL_TABLET | Freq: Every day | ORAL | Status: DC
  Administered 2020-08-15 – 2020-08-16 (×2): 10 mg via ORAL
  Filled 2020-08-14 (×2): qty 1

## 2020-08-14 MED ORDER — DULOXETINE HCL 60 MG PO CPEP
60.0000 mg | ORAL_CAPSULE | Freq: Every day | ORAL | Status: DC
  Administered 2020-08-15 – 2020-08-16 (×2): 60 mg via ORAL
  Filled 2020-08-14 (×2): qty 1

## 2020-08-14 MED ORDER — PYRIDOSTIGMINE BROMIDE 60 MG PO TABS
60.0000 mg | ORAL_TABLET | Freq: Four times a day (QID) | ORAL | Status: DC
  Administered 2020-08-14 – 2020-08-16 (×9): 60 mg via ORAL
  Filled 2020-08-14 (×11): qty 1

## 2020-08-14 MED ORDER — POTASSIUM CHLORIDE CRYS ER 20 MEQ PO TBCR
40.0000 meq | EXTENDED_RELEASE_TABLET | ORAL | Status: AC
  Administered 2020-08-14: 40 meq via ORAL
  Filled 2020-08-14: qty 2

## 2020-08-14 MED ORDER — FLUTICASONE PROPIONATE 50 MCG/ACT NA SUSP
1.0000 | Freq: Every day | NASAL | Status: DC | PRN
  Filled 2020-08-14: qty 16

## 2020-08-14 MED ORDER — HYDROXYZINE HCL 25 MG PO TABS: 25.0000 mg | ORAL_TABLET | Freq: Three times a day (TID) | ORAL | Status: DC | PRN

## 2020-08-14 MED ORDER — DEXAMETHASONE SODIUM PHOSPHATE 10 MG/ML IJ SOLN
10.0000 mg | Freq: Once | INTRAMUSCULAR | Status: AC
  Administered 2020-08-14: 10 mg via INTRAVENOUS
  Filled 2020-08-14: qty 1

## 2020-08-14 MED ORDER — AZATHIOPRINE 50 MG PO TABS
150.0000 mg | ORAL_TABLET | Freq: Every day | ORAL | Status: DC
  Administered 2020-08-14 – 2020-08-16 (×3): 150 mg via ORAL
  Filled 2020-08-14 (×3): qty 3

## 2020-08-14 MED ORDER — OXYCODONE HCL 5 MG PO TABS
15.0000 mg | ORAL_TABLET | Freq: Four times a day (QID) | ORAL | Status: DC | PRN
Start: 2020-08-14 — End: 2020-08-16
  Administered 2020-08-14 – 2020-08-16 (×7): 15 mg via ORAL
  Filled 2020-08-14 (×7): qty 3

## 2020-08-14 MED ORDER — LEVOTHYROXINE SODIUM 75 MCG PO TABS
150.0000 ug | ORAL_TABLET | Freq: Every day | ORAL | Status: DC
  Administered 2020-08-14 – 2020-08-15 (×2): 150 ug via ORAL
  Filled 2020-08-14 (×2): qty 2

## 2020-08-14 MED ORDER — VALACYCLOVIR HCL 500 MG PO TABS: 500.0000 mg | ORAL_TABLET | Freq: Two times a day (BID) | ORAL | Status: DC | PRN

## 2020-08-14 MED ORDER — ONDANSETRON HCL 4 MG/2ML IJ SOLN: 4.0000 mg | Freq: Four times a day (QID) | INTRAMUSCULAR | Status: DC | PRN

## 2020-08-14 MED ORDER — PRAZOSIN HCL 1 MG PO CAPS
1.0000 mg | ORAL_CAPSULE | Freq: Every day | ORAL | Status: DC
  Administered 2020-08-15: 1 mg via ORAL
  Filled 2020-08-14 (×3): qty 1

## 2020-08-14 MED ORDER — SUMATRIPTAN SUCCINATE 100 MG PO TABS
100.0000 mg | ORAL_TABLET | ORAL | Status: DC | PRN
  Filled 2020-08-14: qty 1

## 2020-08-14 MED ORDER — SODIUM CHLORIDE 0.9 % IV BOLUS
1000.0000 mL | Freq: Once | INTRAVENOUS | Status: AC
  Administered 2020-08-14: 1000 mL via INTRAVENOUS

## 2020-08-14 MED ORDER — GLUCERNA SHAKE PO LIQD
237.0000 mL | Freq: Three times a day (TID) | ORAL | Status: DC
  Administered 2020-08-15: 237 mL via ORAL

## 2020-08-14 MED ORDER — TOPIRAMATE 100 MG PO TABS
200.0000 mg | ORAL_TABLET | Freq: Every day | ORAL | Status: DC
  Administered 2020-08-15 – 2020-08-16 (×2): 200 mg via ORAL
  Filled 2020-08-14 (×2): qty 2

## 2020-08-14 MED ORDER — MOMETASONE FURO-FORMOTEROL FUM 100-5 MCG/ACT IN AERO
2.0000 | INHALATION_SPRAY | Freq: Two times a day (BID) | RESPIRATORY_TRACT | Status: DC
  Administered 2020-08-14 – 2020-08-16 (×4): 2 via RESPIRATORY_TRACT
  Filled 2020-08-14: qty 8.8

## 2020-08-14 MED ORDER — INSULIN ASPART 100 UNIT/ML IJ SOLN
0.0000 [IU] | Freq: Three times a day (TID) | INTRAMUSCULAR | Status: DC
  Administered 2020-08-15: 1 [IU] via SUBCUTANEOUS

## 2020-08-14 MED ORDER — HYDROXYZINE HCL 10 MG PO TABS
10.0000 mg | ORAL_TABLET | Freq: Three times a day (TID) | ORAL | Status: DC | PRN
  Administered 2020-08-14 – 2020-08-16 (×6): 10 mg via ORAL
  Filled 2020-08-14 (×8): qty 1

## 2020-08-14 MED ORDER — ALBUTEROL SULFATE (2.5 MG/3ML) 0.083% IN NEBU
2.5000 mg | INHALATION_SOLUTION | RESPIRATORY_TRACT | Status: DC | PRN
  Administered 2020-08-14: 2.5 mg via RESPIRATORY_TRACT
  Filled 2020-08-14: qty 3

## 2020-08-14 MED ORDER — ONDANSETRON HCL 4 MG PO TABS
4.0000 mg | ORAL_TABLET | Freq: Four times a day (QID) | ORAL | Status: DC | PRN
  Administered 2020-08-14: 4 mg via ORAL
  Filled 2020-08-14: qty 1
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
## 2020-08-14 NOTE — ED Provider Notes (Signed)
Notified by tech that patient's blood pressure was downtrending.  80 over 40s in the waiting room.  She repeated blood pressure and it was 77/59.  On my evaluation, the patient was very somnolent.  She would arouse to loud voice, but that would then fall back asleep.  She would respond to sternal rub and pressure on the nailbeds, but then would fall back asleep.  She is alert and oriented x3.  Lungs are clear to auscultation bilaterally on exam, but she does have poor air movement.  She has a history of asthma.  She was trialed on a DuoNeb in triage, but mentation did not improve.  Although blood pressure is slightly improved at 90/60, with downtrending mentation and blood pressure, I obtained an i-STAT VBG.  pH was 7.64.  Discussed with charge RN and patient will be roomed for further evaluation in an acute room.  No history of COPD.  She is a non-smoker.   Joline Maxcy A, PA-C 08/14/20 [id number]    Merryl Hacker, MD 08/14/20 [phone]

## 2020-08-14 NOTE — Progress Notes (Signed)
Received from ED via stretcher; patient ambulated from stretcher to the bedside without assistance; requested the walker for balance; states she normally uses a cane.  Oriented patient to room and unit routine. Fall safety reviewed.

## 2020-08-14 NOTE — Progress Notes (Signed)
VC 2.0/ NIF --20 Good pt effort

## 2020-08-14 NOTE — Progress Notes (Signed)
NIF: -25  NIF preformed on patient. Patient initially only had a 0 to -10 due to low patient effort. Once patient was more alert and put in effort, she was able to get a -25 with 3 attempts.
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
## 2020-08-14 NOTE — Progress Notes (Addendum)
Pt seen by Dr. Lorrin Goodell earlier today. I have re-evaluated her.   She has NIF of -15, and states that she does feel like her MG is contributing. She has 4-/5 neck flexion. She does not count more than 8 on a single breath, but has significant reserve breath which she exhales after saying 8. She briefly has dysconjugate gaze with upgaze, but will not sustain upgaze.  At this point, given the tenuous breathing numbers and neck flexion weakness, I do think that I would favor rescue therapy. She has had IVIG in the past with good results and is very hesitant to pursue plasmapheresis and therefore we will pursue IVIG.   IVIG 400mg /kg, if she tolerates well, could do 800mg /kg tomorrow.   Roland Rack, MD [facility] [phone]  If 7pm- 7am, please page neurology on call as listed in [place].
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
## 2020-08-14 NOTE — ED Notes (Signed)
Patient blood pressure checked several times by this NT. 80s/50s. Triage RN notified and PA notified.

## 2020-08-14 NOTE — ED Notes (Signed)
At this time floor states they need to review patient before accepting

## 2020-08-14 NOTE — ED Notes (Signed)
Manual BP 84/62.

## 2020-08-14 NOTE — Progress Notes (Addendum)
NIF was -30, best out of three tries. Pt had good effort.  VC was 1.82 L, best out of three tries. Pt had good effort.

## 2020-08-14 NOTE — Progress Notes (Signed)
RT NOTE: NIF performed x3 with best effort achieved of -15.  Patient tolerated well.

## 2020-08-15 ENCOUNTER — Encounter (HOSPITAL_COMMUNITY): Payer: Self-pay | Admitting: Internal Medicine

## 2020-08-15 DIAGNOSIS — Z8616 Personal history of COVID-19: Secondary | ICD-10-CM | POA: Diagnosis not present

## 2020-08-15 DIAGNOSIS — E890 Postprocedural hypothyroidism: Secondary | ICD-10-CM | POA: Diagnosis not present

## 2020-08-15 DIAGNOSIS — E119 Type 2 diabetes mellitus without complications: Secondary | ICD-10-CM | POA: Diagnosis not present

## 2020-08-15 DIAGNOSIS — G43909 Migraine, unspecified, not intractable, without status migrainosus: Secondary | ICD-10-CM | POA: Diagnosis not present

## 2020-08-15 DIAGNOSIS — K219 Gastro-esophageal reflux disease without esophagitis: Secondary | ICD-10-CM | POA: Diagnosis present

## 2020-08-15 DIAGNOSIS — H9041 Sensorineural hearing loss, unilateral, right ear, with unrestricted hearing on the contralateral side: Secondary | ICD-10-CM | POA: Diagnosis present

## 2020-08-15 DIAGNOSIS — G4733 Obstructive sleep apnea (adult) (pediatric): Secondary | ICD-10-CM | POA: Diagnosis present

## 2020-08-15 DIAGNOSIS — E1169 Type 2 diabetes mellitus with other specified complication: Secondary | ICD-10-CM | POA: Diagnosis present

## 2020-08-15 DIAGNOSIS — Z6824 Body mass index (BMI) 24.0-24.9, adult: Secondary | ICD-10-CM | POA: Diagnosis not present

## 2020-08-15 DIAGNOSIS — E669 Obesity, unspecified: Secondary | ICD-10-CM | POA: Diagnosis present

## 2020-08-15 DIAGNOSIS — J45909 Unspecified asthma, uncomplicated: Secondary | ICD-10-CM | POA: Diagnosis present

## 2020-08-15 DIAGNOSIS — Z833 Family history of diabetes mellitus: Secondary | ICD-10-CM | POA: Diagnosis not present

## 2020-08-15 DIAGNOSIS — G7001 Myasthenia gravis with (acute) exacerbation: Secondary | ICD-10-CM | POA: Diagnosis not present

## 2020-08-15 DIAGNOSIS — E282 Polycystic ovarian syndrome: Secondary | ICD-10-CM | POA: Diagnosis present

## 2020-08-15 DIAGNOSIS — E876 Hypokalemia: Secondary | ICD-10-CM | POA: Diagnosis not present

## 2020-08-15 DIAGNOSIS — Z9049 Acquired absence of other specified parts of digestive tract: Secondary | ICD-10-CM | POA: Diagnosis not present

## 2020-08-15 DIAGNOSIS — F319 Bipolar disorder, unspecified: Secondary | ICD-10-CM | POA: Diagnosis present

## 2020-08-15 DIAGNOSIS — Z79899 Other long term (current) drug therapy: Secondary | ICD-10-CM | POA: Diagnosis not present

## 2020-08-15 DIAGNOSIS — M797 Fibromyalgia: Secondary | ICD-10-CM | POA: Diagnosis not present

## 2020-08-15 DIAGNOSIS — G4700 Insomnia, unspecified: Secondary | ICD-10-CM | POA: Diagnosis present

## 2020-08-15 DIAGNOSIS — Z7952 Long term (current) use of systemic steroids: Secondary | ICD-10-CM | POA: Diagnosis not present

## 2020-08-15 DIAGNOSIS — F419 Anxiety disorder, unspecified: Secondary | ICD-10-CM | POA: Diagnosis present

## 2020-08-15 DIAGNOSIS — R0602 Shortness of breath: Secondary | ICD-10-CM | POA: Diagnosis present

## 2020-08-15 DIAGNOSIS — I959 Hypotension, unspecified: Secondary | ICD-10-CM | POA: Diagnosis present

## 2020-08-15 DIAGNOSIS — Z20822 Contact with and (suspected) exposure to covid-19: Secondary | ICD-10-CM | POA: Diagnosis present

## 2020-08-15 DIAGNOSIS — E785 Hyperlipidemia, unspecified: Secondary | ICD-10-CM | POA: Diagnosis present

## 2020-08-15 LAB — COMPREHENSIVE METABOLIC PANEL
ALT: 7 U/L (ref 0–44)
AST: 20 U/L (ref 15–41)
Albumin: 2.8 g/dL — ABNORMAL LOW (ref 3.5–5.0)
Alkaline Phosphatase: 49 U/L (ref 38–126)
Anion gap: 7 (ref 5–15)
BUN: 15 mg/dL (ref 6–20)
CO2: 21 mmol/L — ABNORMAL LOW (ref 22–32)
Calcium: 7.8 mg/dL — ABNORMAL LOW (ref 8.9–10.3)
Chloride: 109 mmol/L (ref 98–111)
Creatinine, Ser: 0.53 mg/dL (ref 0.44–1.00)
GFR, Estimated: >60 mL/min (ref >60)
Glucose, Bld: 85 mg/dL (ref 70–99)
Potassium: 4.6 mmol/L (ref 3.5–5.1)
Sodium: 137 mmol/L (ref 135–145)
Total Bilirubin: 1 mg/dL (ref 0.3–1.2)
Total Protein: 6.1 g/dL — ABNORMAL LOW (ref 6.5–8.1)

## 2020-08-15 LAB — CBC
HCT: 34.3 % — ABNORMAL LOW (ref 36.0–46.0)
Hemoglobin: 10.6 g/dL — ABNORMAL LOW (ref 12.0–15.0)
MCH: 26.7 pg (ref 26.0–34.0)
MCHC: 30.9 g/dL (ref 30.0–36.0)
MCV: 86.4 fL (ref 80.0–100.0)
Platelets: 216 10*3/uL (ref 150–400)
RBC: 3.97 MIL/uL (ref 3.87–5.11)
RDW: 12.7 % (ref 11.5–15.5)
WBC: 4.8 10*3/uL (ref 4.0–10.5)
nRBC: 0 % (ref 0.0–0.2)

## 2020-08-15 LAB — GLUCOSE, CAPILLARY
Glucose-Capillary: 149 mg/dL — ABNORMAL HIGH (ref 70–99)
Glucose-Capillary: 89 mg/dL (ref 70–99)
Glucose-Capillary: 92 mg/dL (ref 70–99)

## 2020-08-15 LAB — LACTIC ACID, PLASMA: Lactic Acid, Venous: 1.2 mmol/L (ref 0.5–1.9)

## 2020-08-15 MED ORDER — SODIUM CHLORIDE 0.9 % IV BOLUS
500.0000 mL | Freq: Once | INTRAVENOUS | Status: AC
  Administered 2020-08-15: 500 mL via INTRAVENOUS

## 2020-08-15 MED ORDER — SODIUM CHLORIDE 0.9 % IV BOLUS
1000.0000 mL | Freq: Once | INTRAVENOUS | Status: AC
  Administered 2020-08-15: 1000 mL via INTRAVENOUS

## 2020-08-15 MED ORDER — LEVOTHYROXINE SODIUM 25 MCG PO TABS
137.0000 ug | ORAL_TABLET | Freq: Every day | ORAL | Status: DC
  Administered 2020-08-16: 137 ug via ORAL
  Filled 2020-08-15: qty 1

## 2020-08-15 MED ORDER — IMMUNE GLOBULIN (HUMAN) 10 GM/100ML IV SOLN
800.0000 mg/kg | Freq: Every day | INTRAVENOUS | Status: DC
  Administered 2020-08-15: 55 g via INTRAVENOUS
  Filled 2020-08-15: qty 550
  Filled 2020-08-15: qty 400

## 2020-08-15 MED ORDER — IMMUNE GLOBULIN (HUMAN) 10 GM/100ML IV SOLN
800.0000 mg/kg | Freq: Every day | INTRAVENOUS | Status: DC
  Filled 2020-08-15: qty 550

## 2020-08-15 NOTE — Progress Notes (Signed)
RT NOTES: NIF: -15

## 2020-08-15 NOTE — Progress Notes (Signed)
BP low 81/50 MAP: 60, On call MD made aware, Bolus given, will recheck and update the MD

## 2020-08-15 NOTE — Progress Notes (Signed)
RT NOTES: NIF: -15 VC 1.58L

## 2020-08-15 NOTE — Progress Notes (Addendum)
PROGRESS NOTE    Jade Mathis  WPY:[id number] DOB: Oct 08, 1975 DOA: 08/13/2020 PCP: Hoyt Koch, MD   Brief Narrative:  HPI On 08/14/2020 by Dr. Fuller Plan Jade Mathis is a 45 y.o. female with medical history significant of diabetes mellitus type 2, myasthenia gravis, Graves' disease, anxiety, depression, and fibromyalgia presents with complaints of chest tightness and shortness of breath over the last week.  Earlier this week patient reported having intermittent cough with wheezing and suspected symptoms related with allergies.  Notes tightness across her whole chest.  Shortness of breath is worse with exertion, but she has even noticed it at rest.  She had tried home breathing treatment with only temporary relief.  Denies any medication changes and she has been taking all of her medications as advised.  Associated symptoms of decreased sleep due to not feeling quite right, global weakness, headaches, and high fatigue.  Denied any neck pain, double vision, fever, or recent sick contacts.  Interim history Patient presented with chest tightness and shortness of breath.  She does have a history of myasthenia gravis.  Neurology consulted and appreciated feels this could be crisis and is started her on IVIG. Assessment & Plan   Myasthenia gravis crisis -Patient complaining of chest pain and shortness of breath along with weakness and fatigue -Neurology consulted and appreciated-started on IVIG -Continue to monitor NIF and vital capacity-currently NIF is -15 -Continue home medications of Mestinon, prednisone, Imuran  Chest tightness/shortness of breath -Suspect secondary to the  -Currently on room air -EKG without significant ischemic changes -High-sensitivity troponins unremarkable x2  -Continue Dulera and DuoNebs as needed for shortness of breath  Diabetes mellitus, type II -Continue insulin sliding scale and CBG monitoring -hemoglobin A1c  pending  Hypotension -Patient asymptomatic, suspect due to meds although patient states that her blood pressure normally runs low -Will also add holding parameters to prazosin  Hypokalemia -Resolved with replacement, continue to monitor -Magnesium was 2.1  Hypocalcemia -Patient received calcium gluconate -Calcium currently 7.8 however corrected for an albumin of 2.8 is up to 8.8 -Continue replace and monitor  Migraines -Continue Topamax and Imitrex as needed  Fibromyalgia/chronic pain -Continue home medications of oxycodone, gabapentin  Hypothyroidism -Currently on synthroid -TSH 0.124, which would correlate with hyperthyroidism however given the patient is currently in an acute status will minimally decrease dose from 123mcg to 124mcg -Recommend the patient has thyroid function studies conducted in 4 to 6 weeks  DVT Prophylaxis Lovenox  Code Status: Full  Family Communication: None at bedside  Disposition Plan:  Status is: Observation  The patient will require care spanning > 2 midnights and should be moved to inpatient because: IV treatments appropriate due to intensity of illness or inability to take PO  Dispo: The patient is from: Home              Anticipated d/c is to: Home              Patient currently is not medically stable to d/c.   Difficult to place patient No  Consultants Neurology  Procedures  None  Antibiotics   Anti-infectives (From admission, onward)   Start     Dose/Rate Route Frequency Ordered Stop   08/14/20 1657  valACYclovir (VALTREX) tablet 500 mg        500 mg Oral 2 times daily PRN 08/14/20 1659        Subjective:   Jade Mathis seen and examined today.  Patient continues to complain of back pain  and shortness of breath.  Denies current chest chest pain or dizziness, abdominal pain, nausea or vomiting, headache.  Does want to go home soon as her son will be graduating.    Objective:   Vitals:   08/15/20 0339 08/15/20 0725  08/15/20 0815 08/15/20 0817  BP: 94/66 (!) 88/56    Pulse: 64 80    Resp: 19 18    Temp: (!) 97.5 F (36.4 C)     TempSrc: Oral Oral    SpO2: 100% 100% 99% 99%  Weight:      Height:        Intake/Output Summary (Last 24 hours) at 08/15/2020 1053 Last data filed at 08/15/2020 0300 Gross per 24 hour  Intake 1976.52 ml  Output --  Net 1976.52 ml   Filed Weights   08/14/20 1425  Weight: 68 kg    Exam  General: Well developed, well nourished, NAD, appears stated age  HEENT: NCAT,  mucous membranes moist.   Cardiovascular: S1 S2 auscultated, RRR.  Respiratory: Diminished breath sounds, however no wheezing  Abdomen: Soft, nontender, nondistended, + bowel sounds  Extremities: warm dry without cyanosis clubbing or edema  Neuro: AAOx3, nonfocal  Psych: appropriate mood and affect   Data Reviewed: I have personally reviewed following labs and imaging studies  CBC: Recent Labs  Lab 08/13/20 2146 08/14/20 0231 08/15/20 0707  WBC 4.6  --  4.8  NEUTROABS 2.3  --   --   HGB 11.9* 12.9 10.6*  HCT 37.9 38.0 34.3*  MCV 85.6  --  86.4  PLT 251  --  315   Basic Metabolic Panel: Recent Labs  Lab 08/13/20 2146 08/14/20 0231 08/14/20 0742 08/15/20 0304  NA 137 136  --  137  K 3.0* 4.2  --  4.6  CL 102  --   --  109  CO2 27  --   --  21*  GLUCOSE 109*  --   --  85  BUN 16  --   --  15  CREATININE 0.63  --   --  0.53  CALCIUM 8.8*  --   --  7.8*  MG  --   --  2.1  --    GFR: Estimated Creatinine Clearance: 83.6 mL/min (by C-G formula based on SCr of 0.53 mg/dL). Liver Function Tests: Recent Labs  Lab 08/15/20 0304  AST 20  ALT 7  ALKPHOS 49  BILITOT 1.0  PROT 6.1*  ALBUMIN 2.8*   No results for input(s): LIPASE, AMYLASE in the last 168 hours. No results for input(s): AMMONIA in the last 168 hours. Coagulation Profile: No results for input(s): INR, PROTIME in the last 168 hours. Cardiac Enzymes: No results for input(s): CKTOTAL, CKMB, CKMBINDEX,  TROPONINI in the last 168 hours. BNP (last 3 results) No results for input(s): PROBNP in the last 8760 hours. HbA1C: No results for input(s): HGBA1C in the last 72 hours. CBG: Recent Labs  Lab 08/14/20 0203 08/14/20 1136 08/14/20 1741 08/14/20 2215 08/15/20 0657  GLUCAP 117* 144* 113* 147* 149*   Lipid Profile: No results for input(s): CHOL, HDL, LDLCALC, TRIG, CHOLHDL, LDLDIRECT in the last 72 hours. Thyroid Function Tests: Recent Labs    08/14/20 1432  TSH 0.124*   Anemia Panel: No results for input(s): VITAMINB12, FOLATE, FERRITIN, TIBC, IRON, RETICCTPCT in the last 72 hours. Urine analysis:    Component Value Date/Time   COLORURINE YELLOW 08/14/2020 0512   APPEARANCEUR HAZY (A) 08/14/2020 0512   LABSPEC 1.043 (H) 08/14/2020  [place] 6.0 08/14/2020 0512   GLUCOSEU >=500 (A) 08/14/2020 0512   GLUCOSEU NEGATIVE 12/02/2013 0848   HGBUR NEGATIVE 08/14/2020 [place] 08/14/2020 0512   BILIRUBINUR Neg 09/22/2014 [place] 08/14/2020 0512   PROTEINUR NEGATIVE 08/14/2020 0512   UROBILINOGEN 0.2 11/17/2019 1603   NITRITE NEGATIVE 08/14/2020 0512   LEUKOCYTESUR TRACE (A) 08/14/2020 0512   Sepsis Labs: @LABRCNTIP (procalcitonin:4,lacticidven:4)  ) Recent Results (from the past 240 hour(s))  SARS CORONAVIRUS 2 (TAT 6-24 HRS) Nasopharyngeal Nasopharyngeal Swab     Status: None   Collection Time: 08/14/20  6:35 AM   Specimen: Nasopharyngeal Swab  Result Value Ref Range Status   SARS Coronavirus 2 NEGATIVE NEGATIVE Final    Comment: (NOTE) SARS-CoV-2 target nucleic acids are NOT DETECTED.  The SARS-CoV-2 RNA is generally detectable in upper and lower respiratory specimens during the acute phase of infection. Negative results do not preclude SARS-CoV-2 infection, do not rule out co-infections with other pathogens, and should not be used as the sole basis for treatment or other patient management decisions. Negative results must be  combined with clinical observations, patient history, and epidemiological information. The expected result is Negative.  Fact Sheet for Patients: SugarRoll.be  Fact Sheet for Healthcare Providers: https://www.woods-mathews.com/  This test is not yet approved or cleared by the [place] FDA and  has been authorized for detection and/or diagnosis of SARS-CoV-2 by FDA under an Emergency Use Authorization (EUA). This EUA will remain  in effect (meaning this test can be used) for the duration of the COVID-19 declaration under Se ction 564(b)(1) of the Act, 21 U.S.C. section 360bbb-3(b)(1), unless the authorization is terminated or revoked sooner.  Performed at [facility], [place] [address] [place], [place] [address]       Radiology Studies: DG Chest 2 View  Result Date: 08/13/2020 CLINICAL DATA:  45 year old female with shortness of breath and cough. EXAM: CHEST - 2 VIEW COMPARISON:  Chest radiograph dated 02/12/2018. FINDINGS: No focal consolidation, pleural effusion, or pneumothorax. The cardiac silhouette is within limits. Median sternotomy wires and mediastinal surgical clips. No acute osseous pathology. Right upper quadrant cholecystectomy clips. IMPRESSION: No active cardiopulmonary disease. Electronically Signed   By: Anner Crete M.D.   On: 08/13/2020 22:21     Scheduled Meds: . azaTHIOprine  150 mg Oral Daily  . DULoxetine  60 mg Oral Daily  . enoxaparin (LOVENOX) injection  40 mg Subcutaneous Q24H  . feeding supplement (GLUCERNA SHAKE)  237 mL Oral TID BM  . gabapentin  400 mg Oral TID  . insulin aspart  0-9 Units Subcutaneous TID WC  . ipratropium-albuterol  3 mL Nebulization BID  . levothyroxine  150 mcg Oral QAC breakfast  . loratadine  10 mg Oral Daily  . mometasone-formoterol  2 puff Inhalation BID  . prazosin  1 mg Oral Daily  . predniSONE  10 mg Oral Daily  . pyridostigmine  60 mg Oral QID  . QUEtiapine   200 mg Oral QHS  . topiramate  200 mg Oral Q1500  . traZODone  100 mg Oral QHS   Continuous Infusions: . Immune Globulin 10%       LOS: 0 days   Time Spent in minutes   45 minutes  Merril Isakson D.O. on 08/15/2020 at 10:53 AM  Between 7am to 7pm - Please see pager noted on [email or web address]  After 7pm go to [email or web address]  And look for the night coverage person covering for  me after hours  [facility] Office  [phone]

## 2020-08-15 NOTE — Progress Notes (Signed)
Spoke with MD Ree Kida concerning placing a midline for this patient. The request for a midline was due to the patient needing blood draws and IV fluids. At this time the patient has a working PIV and was only needing IVIG. I asked MD if the patient was going to need IV fluids outside of the hospital if so could a picc line be placed. This would guarantee blood draws and the patient would have adequate access for IVIG. MD stated the patient only needed 2 more days of fluids and did not want to place a PICC line. I then asked if I could just place another PIV for the IV fluids that were needed? She responded yes just a larger gauge. I spoke with the patient as well about placing another PIV and at that time she stated no need because the iv in her hand was working, and she was staying in the hospital and not leaving. I flushed the PIV and it worked well and the patient denied any complains to the PIV so IVIG was started. Patient was made aware that Lab would have to stick her for lab draws and she replied that would be okay.

## 2020-08-15 NOTE — Progress Notes (Signed)
Neurology Progress Note  S: No overnight events. Patient states she had no issues with the 1st dose of IVIG of 400mg /kg. She is begging to go home. NP told her NP would look into receiving additional IVIG at infusion clinic. Patient says she feels strong. Got some sleep. No issues with vision or dysphagia. No SOB.   O: Current vital signs: BP (!) 84/60 (BP Location: Right Arm)   Pulse 71   Temp 98 F (36.7 C) (Oral)   Resp 16   Ht 5' 6.14" (1.68 m)   Wt 68 kg   SpO2 100%   BMI 24.11 kg/m  Vital signs in last 24 hours: Temp:  [97.3 F (36.3 C)-98.7 F (37.1 C)] 98 F (36.7 C) (05/29 1132) Pulse Rate:  [60-80] 71 (05/29 1132) Resp:  [14-19] 16 (05/29 1135) BP: (77-132)/(37-82) 84/60 (05/29 1132) SpO2:  [96 %-100 %] 100 % (05/29 1135) Weight:  [68 kg] 68 kg (05/28 1425)  GENERAL: Awake, alert in NAD. HEENT: Normocephalic and atraumatic LUNGS: Normal respiratory effort.  Psych: Affect flat.  Ext: warm.  NEURO:  Mental Status: AA&Ox3  Speech/Language: speech is without aphasia or dysarthria.  Naming, repetition, fluency, and comprehension intact.  Very little ptosis noted to OS. Upward gaze x 1 minute does not cause increased ptosis. No diplopia on cardinal fields of gaze. PERRL.  Sensation to light touch intact to face and extremities.  Strength is 4+/5 to extremities. 4-/5 for neck flexion.  She can count to 35 without taking a breath. NP watched her swallow ginger ale without issues.   Medications  Current Facility-Administered Medications:  .  acetaminophen (TYLENOL) tablet 650 mg, 650 mg, Oral, Q6H PRN **OR** acetaminophen (TYLENOL) suppository 650 mg, 650 mg, Rectal, Q6H PRN, Wrage, Rondell A, MD .  albuterol (PROVENTIL) (2.5 MG/3ML) 0.083% nebulizer solution 2.5 mg, 2.5 mg, Nebulization, Q4H PRN, Tamala Julian, Rondell A, MD, 2.5 mg at 08/14/20 1743 .  azaTHIOprine (IMURAN) tablet 150 mg, 150 mg, Oral, Daily, Kopke, Rondell A, MD, 150 mg at 08/15/20 1021 .  DULoxetine  (CYMBALTA) DR capsule 60 mg, 60 mg, Oral, Daily, Mcandrew, Rondell A, MD, 60 mg at 08/15/20 1020 .  enoxaparin (LOVENOX) injection 40 mg, 40 mg, Subcutaneous, Q24H, Degante, Rondell A, MD, 40 mg at 08/14/20 1803 .  feeding supplement (GLUCERNA SHAKE) (GLUCERNA SHAKE) liquid 237 mL, 237 mL, Oral, TID BM, Mcdougall, Rondell A, MD .  fluticasone (FLONASE) 50 MCG/ACT nasal spray 1-2 spray, 1-2 spray, Each Nare, Daily PRN, Giroux, Rondell A, MD .  gabapentin (NEURONTIN) capsule 400 mg, 400 mg, Oral, TID, Tait, Rondell A, MD, 400 mg at 08/15/20 1020 .  hydrOXYzine (ATARAX/VISTARIL) tablet 10 mg, 10 mg, Oral, TID PRN, Fuller Plan A, MD, 10 mg at 08/15/20 1022 .  Immune Globulin 10% (PRIVIGEN) IV infusion 55 g, 800 mg/kg, Intravenous, Daily, Roland Rack P, MD .  insulin aspart (novoLOG) injection 0-9 Units, 0-9 Units, Subcutaneous, TID WC, Fuller Plan A, MD, 1 Units at 08/15/20 0758 .  ipratropium-albuterol (DUONEB) 0.5-2.5 (3) MG/3ML nebulizer solution 3 mL, 3 mL, Nebulization, BID, Schwartz, Rondell A, MD, 3 mL at 08/15/20 0813 .  [START ON 08/16/2020] levothyroxine (SYNTHROID) tablet 137 mcg, 137 mcg, Oral, Q0600, Mikhail, Velta Addison, DO .  loratadine (CLARITIN) tablet 10 mg, 10 mg, Oral, Daily, Deguia, Rondell A, MD, 10 mg at 08/15/20 1020 .  mometasone-formoterol (DULERA) 100-5 MCG/ACT inhaler 2 puff, 2 puff, Inhalation, BID, Fuller Plan A, MD, 2 puff at 08/15/20 0813 .  ondansetron (ZOFRAN) tablet  4 mg, 4 mg, Oral, Q6H PRN, 4 mg at 08/14/20 1252 **OR** ondansetron (ZOFRAN) injection 4 mg, 4 mg, Intravenous, Q6H PRN, Sprung, Rondell A, MD .  oxyCODONE (Oxy IR/ROXICODONE) immediate release tablet 15 mg, 15 mg, Oral, QID PRN, Tamala Julian, Rondell A, MD, 15 mg at 08/15/20 1022 .  prazosin (MINIPRESS) capsule 1 mg, 1 mg, Oral, Daily, Mikhail, [place], DO, 1 mg at 08/15/20 1020 .  predniSONE (DELTASONE) tablet 10 mg, 10 mg, Oral, Daily, Bodenheimer, Rondell A, MD, 10 mg at 08/15/20 1021 .  pyridostigmine (MESTINON)  tablet 60 mg, 60 mg, Oral, QID, Borah, Rondell A, MD, 60 mg at 08/15/20 1019 .  QUEtiapine (SEROQUEL) tablet 200 mg, 200 mg, Oral, QHS, Rise Patience, MD, 200 mg at 08/14/20 2143 .  SUMAtriptan (IMITREX) tablet 100 mg, 100 mg, Oral, PRN, Hohman, Rondell A, MD .  topiramate (TOPAMAX) tablet 200 mg, 200 mg, Oral, Q1500, Forgione, Rondell A, MD .  traZODone (DESYREL) tablet 100 mg, 100 mg, Oral, QHS, Salva, Rondell A, MD, 100 mg at 08/14/20 2130 .  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 She presented yesterday with complaints of SOB and chest pain x 1 week. We consulted and did not feel she was in MG crisis due to no weakness in her arms or legs. However, when her counting with one breath was only to 8 and she had head flexion weakness, IVIG was started on 08/14/20. She tolerated 400mg /kg dose yesterday. Her NIFs (-15) have not been where we want them, but her VC is normal. She is having no acute respiratory symptoms today as evidenced by her ability to count to 35 without taking a breath. Her neck flexion remains with decreased strength, so will continue IVIG as below.   Impression: MG exacerbation.  Plan:  -increase IVIG to 800 mg/kg today and tomorrow and stop.  -Tried to get her into out patient infusion clinic, but no way to set that up on a weekend, or tomorrow because of holiday. Patient will have to stay through Monday's dose.  -Continue to monitor NIF/VC q 6 hours and clinical respiratory status.  -Intubation should not be solely based on O2 saturation but WOB,  Secretions, inability to protect airway, or worsening neck flexion.  -Continue to avoid medications that can make MG worse as per consult note.   Pt seen by Clance Boll, MSN, APN-BC/Nurse Practitioner/Neuro and later by MD. Note and plan to be edited as needed by MD.  Pager: [phone]

## 2020-08-15 NOTE — Progress Notes (Signed)
NIF=-25 average with three attempts. VC=1.80 average on three attempts. Great effort

## 2020-08-15 NOTE — Progress Notes (Signed)
On call MD at the bedside to see pt, labs were ordered but CBC was not enough as the pt refused blood stick and was not pleasant with the phlebotomist, BP improving at the moment. Another phlebotomist to attempt blood draws

## 2020-08-16 DIAGNOSIS — E876 Hypokalemia: Secondary | ICD-10-CM | POA: Diagnosis not present

## 2020-08-16 DIAGNOSIS — M797 Fibromyalgia: Secondary | ICD-10-CM | POA: Diagnosis not present

## 2020-08-16 DIAGNOSIS — E119 Type 2 diabetes mellitus without complications: Secondary | ICD-10-CM | POA: Diagnosis not present

## 2020-08-16 LAB — GLUCOSE, CAPILLARY
Glucose-Capillary: 87 mg/dL (ref 70–99)
Glucose-Capillary: 98 mg/dL (ref 70–99)
Glucose-Capillary: 81 mg/dL (ref 70–99)

## 2020-08-16 MED ORDER — ALBUTEROL SULFATE HFA 108 (90 BASE) MCG/ACT IN AERS: 2.0000 | INHALATION_SPRAY | Freq: Four times a day (QID) | RESPIRATORY_TRACT | 1 refills | Status: DC | PRN

## 2020-08-16 MED ORDER — FLUTICASONE-SALMETEROL 100-50 MCG/DOSE IN AEPB: 1.0000 | INHALATION_SPRAY | Freq: Two times a day (BID) | RESPIRATORY_TRACT | 2 refills | Status: AC

## 2020-08-16 MED ORDER — ALBUTEROL SULFATE (2.5 MG/3ML) 0.083% IN NEBU: 2.5000 mg | INHALATION_SOLUTION | RESPIRATORY_TRACT | 2 refills | Status: DC | PRN

## 2020-08-16 MED ORDER — IMMUNE GLOBULIN (HUMAN) 10 GM/100ML IV SOLN
800.0000 mg/kg | Freq: Every day | INTRAVENOUS | Status: AC
  Administered 2020-08-16: 55 g via INTRAVENOUS
  Filled 2020-08-16: qty 400

## 2020-08-16 MED ORDER — IPRATROPIUM-ALBUTEROL 0.5-2.5 (3) MG/3ML IN SOLN: 3.0000 mL | Freq: Four times a day (QID) | RESPIRATORY_TRACT | Status: DC | PRN

## 2020-08-16 MED ORDER — LEVOTHYROXINE SODIUM 137 MCG PO TABS: 137.0000 ug | ORAL_TABLET | Freq: Every day | ORAL | 1 refills | Status: DC

## 2020-08-16 NOTE — Progress Notes (Signed)
RT NOTES: NIF: -15 VC: 0.8L. Pt with minimal effort. Notifying MD.

## 2020-08-16 NOTE — Progress Notes (Signed)
RT NOTES: NIF: -20. PT with good effort.

## 2020-08-16 NOTE — Progress Notes (Signed)
Pt refused labs at this time, she stated she will let us know later when she decides.

## 2020-08-16 NOTE — Progress Notes (Signed)
[name], NT returned to unit with wheelchair; stated patient insisted she was fine waiting in the ED lobby for her ride home; NT offered to wait with her and patient refused. Patient is discharged from hospital.

## 2020-08-16 NOTE — Discharge Instructions (Signed)
Myasthenia Gravis Myasthenia gravis (MG) is a long-term (chronic) condition that causes weakness in the muscles you can control (voluntary muscles). MG can affect any voluntary muscle. The muscles most often affected are the ones that control:  Eye movement.  Facial movements.  Swallowing. MG is a disease in which the body's disease-fighting system (immune system) attacks its own healthy tissues (autoimmune disease). When you have MG, your immune system makes proteins (antibodies) that block a chemical (acetylcholine) that your body needs to send nerve signals to your muscles. This causes muscle weakness. What are the causes? The exact cause of MG is not known. What increases the risk? The following factors may make you more likely to develop this condition:  Having an enlarged thymus gland. The thymus gland is located under the breastbone. It makes certain cells for the immune system.  Having a family history of MG.   What are the signs or symptoms? Symptoms of MG may include:  Drooping eyelids.  Double vision.  Muscle weakness that gets worse with activity and gets better after rest.  Difficulty walking.  Trouble chewing and swallowing.  Trouble making facial expressions.  Slurred speech.  Weakness of the arms, hands, and legs. Sudden, severe difficulty breathing (myasthenic crisis) may develop after having:  An infection.  A fever.  A bad reaction to a medicine. Myasthenic crisis requires emergency breathing support. Sometimes symptoms of MG go away for a while (remission) and then come back later. How is this diagnosed? This condition may be diagnosed based on:  Your symptoms and medical history.  A physical exam.  Blood tests.  Tests of your muscle strength and function.  Imaging tests, such as a CT scan or an MRI. How is this treated? The goal of treatment is to improve muscle strength. Treatment may include:  Taking medicine.  Making lifestyle  changes that focus on saving your energy.  Doing physical therapy to gain strength.  Having surgery to remove the thymus gland (thymectomy). This may result in a long remission for some people.  Having a procedure to remove the acetylcholine antibodies (plasmapheresis).  Getting emergency breathing support, if you experience myasthenic crisis. If you experience remission, you may be able to stop treatment and then resume treatment when your symptoms return. Follow these instructions at home:  Take over-the-counter and prescription medicines only as told by your health care provider.  Get plenty of rest and sleep. Take frequent breaks to rest your eyes, especially when in bright light or working on a computer.  Maintain a healthy diet and a healthy weight. Work with your health care provider or a diet and nutrition specialist (dietitian) if you need help.  Do exercises as told by your health care provider or physical therapist.  Do not use any products that contain nicotine or tobacco, such as cigarettes and e-cigarettes. If you need help quitting, ask your health care provider.  Prevent infections by: ? Washing your hands often with soap and water. If soap and water are not available, use hand sanitizer. ? Avoiding contact with other people who are sick. ? Avoiding touching your eyes, nose, and mouth. ? Cleaning surfaces in your home that are touched often using a disinfectant.  Keep all follow-up visits as told by your health care provider. This is important.   Contact a health care provider if:  Your symptoms change or get worse, especially after having a fever or infection. Get help right away if:  You have trouble breathing. Summary  Myasthenia  gravis (MG) is a long-term (chronic) condition that causes weakness in the muscles you can control (voluntary muscles).  A symptom of MG is muscle weakness that gets worse with activity and gets better after rest.  Sudden, severe  difficulty breathing (myasthenic crisis) may develop after having an infection, a fever, or a bad reaction to a medicine.  The goal of treatment is to improve muscle strength. Treatment may include medicines, lifestyle changes, physical therapy, surgery, plasmapheresis, or emergency breathing support. This information is not intended to replace advice given to you by your health care provider. Make sure you discuss any questions you have with your health care provider. Document Revised: 11/21/2019 Document Reviewed: 11/21/2019 Elsevier Patient Education  2021 [employer].

## 2020-08-16 NOTE — Progress Notes (Signed)
Patient ready for discharge to home; discharge instructions given and reviewed; Rx's sent electronically. Patient discharged out via wheelchair; all belongings returned.

## 2020-08-16 NOTE — Discharge Summary (Signed)
Physician Discharge Summary  Varie Machamer ZJQ:[id number] DOB: 02-27-1976 DOA: 08/13/2020  PCP: Hoyt Koch, MD  Admit date: 08/13/2020 Discharge date: 08/16/2020  Time spent: 45 minutes  Recommendations for Outpatient Follow-up:  Patient will be discharged to home.  Patient will need to follow up with primary care provider within one week of discharge, repeat BMP and thyroid function studies.  Follow up with neurology, Dr. Posey Pronto. Patient should continue medications as prescribed.  Patient should follow a carb modified diet.    Discharge Diagnoses:  Myasthenia gravis crisis Chest tightness/shortness of breath Diabetes mellitus, type II Hypotension Hypokalemia Hypocalcemia Migraines Fibromyalgia/chronic pain Hypothyroidism  Discharge Condition: Stable  Diet recommendation: Carb modified   Filed Weights   08/14/20 1425  Weight: 68 kg    History of present illness:  On 08/14/2020 by Dr. Fuller Plan Carlis Abbott Smithis a 45 y.o.femalewith medical history significant ofdiabetes mellitus type 2, myasthenia gravis, Graves' disease, anxiety, depression, and fibromyalgia presents with complaints of chest tightness and shortness of breath over the last week. Earlier this week patient reported having intermittent cough withwheezing and suspected symptoms related with allergies. Notes tightness across her whole chest. Shortness of breath is worse with exertion, but she has even noticed it at rest. She had tried home breathing treatment with only temporary relief. Denies any medication changes and she has been taking all of her medications as advised. Associated symptoms of decreased sleep due to not feelingquiteright, global weakness, headaches, and high fatigue. Denied any neck pain, double vision, fever, orrecent sick contacts.  Hospital Course:  Myasthenia gravis crisis -Patient complaining of chest pain and shortness of breath along with weakness and  fatigue -Neurology consulted and appreciated-started on IVIG-received 3 doses -Continue to monitor NIF and vital capacity-currently NIF is -15 -discussed NIF with neurology, feels that neph remains quite low although appears to be discordant from her physical exam findings. -Patient does feel that her breathing is improved -Continue home medications of Mestinon, prednisone, Imuran -Patient to follow-up with neurologist  Chest tightness/shortness of breath -Suspect secondary to the  -Currently on room air -EKG without significant ischemic changes -High-sensitivity troponins unremarkable x2  -Continue Dulera and DuoNebs as needed for shortness of breath  Diabetes mellitus, type II -Continue insulin sliding scale and CBG monitoring -hemoglobin A1c pending-can be followed up on by PCP  Hypotension -Patient asymptomatic, suspect due to meds although patient states that her blood pressure normally runs low -Will also add holding parameters to prazosin  Hypokalemia -Resolved with replacement, patient did not allow for repeat labs today -Magnesium was 2.1 -Would repeat BMP in 1 week  Hypocalcemia -Patient received calcium gluconate -Calcium currently 7.8 however corrected for an albumin of 2.8 is up to 8.8 -Repeat in 1 week  Migraines -Continue Topamax and Imitrex as needed  Fibromyalgia/chronic pain -Continue home medications of oxycodone, gabapentin  Hypothyroidism -Currently on synthroid -TSH 0.124, which would correlate with hyperthyroidism however given the patient is currently in an acute status will minimally decrease dose from 117mcg to 186mcg -Recommend the patient has thyroid function studies conducted in 4 to 6 weeks  Asthma -Unknown type -Patient wishes to have her Singulair as well as nebulizer treatments refilled -Currently not in exacerbation  Consultants Neurology  Procedures  None   Discharge Exam: Vitals:   08/16/20 1305 08/16/20 1320   BP: (!) 81/55 (!) 87/62  Pulse: 85 85  Resp: 16 16  Temp:    SpO2: 96% 98%     General: Well developed, well  nourished, NAD, appears stated age  45: NCAT,  mucous membranes moist.  Cardiovascular: S1 S2 auscultated, RRR  Respiratory: Diminished breath sounds, clear, no wheezing  Abdomen: Soft, nontender, nondistended, + bowel sounds  Extremities: warm dry without cyanosis clubbing or edema  Neuro: AAOx3, nonfocal  Psych: appropriate mood and affect  Discharge Instructions Discharge Instructions    Discharge instructions   Complete by: As directed    Patient will be discharged to home.  Patient will need to follow up with primary care provider within one week of discharge, repeat BMP and thyroid function studies.  Follow up with neurology, Dr. Posey Pronto. Patient should continue medications as prescribed.  Patient should follow a carb modified diet.   Increase activity slowly   Complete by: As directed      Allergies as of 08/16/2020      Reactions   Depo-provera [medroxyprogesterone] Other (See Comments)   Headaches    Other Itching, Other (See Comments)   Patient requires 1-2 Hydroxyzine when pain meds are taken   Vicodin [hydrocodone-acetaminophen] Nausea Only      Medication List    TAKE these medications   Accu-Chek Aviva Plus test strip Generic drug: glucose blood Use as instructed 2x daily   Accu-Chek Aviva Plus w/Device Kit Use to test blood sugar daily   Accu-Chek Softclix Lancets lancets 1 each by Other route 4 (four) times daily. Use as instructed What changed: when to take this   albuterol (2.5 MG/3ML) 0.083% nebulizer solution Commonly known as: PROVENTIL Take 3 mLs (2.5 mg total) by nebulization every 4 (four) hours as needed for wheezing or shortness of breath.   albuterol 108 (90 Base) MCG/ACT inhaler Commonly known as: VENTOLIN HFA Inhale 2 puffs into the lungs every 6 (six) hours as needed for wheezing or shortness of breath.    aspirin-acetaminophen-caffeine 250-250-65 MG tablet Commonly known as: EXCEDRIN MIGRAINE Take 1 tablet by mouth every 6 (six) hours as needed for headache or migraine.   azaTHIOprine 50 MG tablet Commonly known as: IMURAN TAKE THREE TABLETS (150 MG TOTAL) BY MOUTH DAILY. What changed: See the new instructions.   b complex vitamins tablet Take 1 tablet by mouth daily.   Centrum Women Tabs Take 1 tablet by mouth daily with breakfast.   cetirizine 10 MG tablet Commonly known as: ZYRTEC TAKE 1 TABLET BY MOUTH EVERY DAY   cyclobenzaprine 10 MG tablet Commonly known as: FLEXERIL Take 10 mg by mouth 3 (three) times daily as needed (for headaches).   DULoxetine 60 MG capsule Commonly known as: CYMBALTA Take 60 mg by mouth daily.   Erenumab-aooe 140 MG/ML Soaj Inject 140 mg into the skin every 30 (thirty) days.   ergocalciferol 1.25 MG (50000 UT) capsule Commonly known as: VITAMIN D2 Take 50,000 Units by mouth every Sunday.   feeding supplement (GLUCERNA SHAKE) Liqd Take 237 mLs by mouth 3 (three) times daily between meals.   Ferralet 90 90-1 MG Tabs TAKE 1 TABLET BY MOUTH EVERY DAY   fluticasone 50 MCG/ACT nasal spray Commonly known as: FLONASE Place 1 spray into both nostrils daily. What changed:   how much to take  when to take this  reasons to take this   Fluticasone-Salmeterol 100-50 MCG/DOSE Aepb Commonly known as: ADVAIR Inhale 1 puff into the lungs 2 (two) times daily.   gabapentin 400 MG capsule Commonly known as: NEURONTIN Take 400 mg by mouth 3 (three) times daily.   hydrOXYzine 10 MG tablet Commonly known as: ATARAX/VISTARIL Take 10 mg  by mouth 3 (three) times daily as needed for itching.   ibuprofen 800 MG tablet Commonly known as: ADVIL Take 800 mg by mouth every 8 (eight) hours as needed for mild pain or headache.   Insulin Pen Needle 31G X 5 MM Misc Use daily with insulin pen   Jardiance 25 MG Tabs tablet Generic drug:  empagliflozin Take 12.5 mg by mouth daily.   levothyroxine 137 MCG tablet Commonly known as: SYNTHROID Take 1 tablet (137 mcg total) by mouth daily at 6 (six) AM. Start taking on: Aug 17, 2020 What changed:   medication strength  how much to take  when to take this   linaclotide 290 MCG Caps capsule Commonly known as: LINZESS Take 290 mcg by mouth daily before breakfast.   omega-3 acid ethyl esters 1 g capsule Commonly known as: LOVAZA TAKE 2 CAPSULES (2 G TOTAL) BY MOUTH 2 (TWO) TIMES DAILY.   ondansetron 4 MG disintegrating tablet Commonly known as: ZOFRAN-ODT Take 1 tablet (4 mg total) by mouth every 8 (eight) hours as needed for nausea or vomiting. What changed:   how much to take  reasons to take this   oxyCODONE 15 MG immediate release tablet Commonly known as: ROXICODONE Take 15 mg by mouth 4 (four) times daily.   OZEMPIC (1 MG/DOSE) [place] Inject 1 mg into the skin every Sunday.   pantoprazole 40 MG tablet Commonly known as: PROTONIX TAKE 1 TABLET BY MOUTH EVERY DAY What changed: when to take this   phentermine 37.5 MG tablet Commonly known as: ADIPEX-P Take 37.5 mg by mouth daily before breakfast.   prazosin 1 MG capsule Commonly known as: MINIPRESS Take 1 mg by mouth daily.   predniSONE 10 MG tablet Commonly known as: DELTASONE Take 1 tablet (10 mg total) by mouth daily. What changed: when to take this   prochlorperazine 10 MG tablet Commonly known as: COMPAZINE Take 1 tablet (10 mg total) by mouth 2 (two) times daily as needed for nausea.   promethazine 25 MG tablet Commonly known as: PHENERGAN Take 1 tablet (25 mg total) by mouth every 6 (six) hours as needed for nausea or vomiting.   promethazine 25 MG suppository Commonly known as: PHENERGAN Place 1 suppository (25 mg total) rectally every 6 (six) hours as needed for up to 12 doses for nausea or vomiting.   pyridostigmine 60 MG tablet Commonly known as: MESTINON TAKE 1 TABLET (60 MG  TOTAL) BY MOUTH 4 TIMES DAILY. What changed:   how much to take  how to take this  when to take this  additional instructions   QUEtiapine 200 MG tablet Commonly known as: SEROQUEL Take 200 mg by mouth at bedtime.   SUMAtriptan 100 MG tablet Commonly known as: IMITREX Take 100 mg by mouth as needed for migraine (and may repeat in 2 hours if headache persists or recurs).   topiramate 200 MG tablet Commonly known as: TOPAMAX Take 200 mg by mouth daily in the afternoon.   traZODone 100 MG tablet Commonly known as: DESYREL Take 1 tablet (100 mg total) by mouth at bedtime.   triamcinolone cream 0.1 % Commonly known as: KENALOG Apply 1 application topically 2 (two) times daily. What changed:   when to take this  reasons to take this   valACYclovir 500 MG tablet Commonly known as: VALTREX Take 1 tablet (500 mg total) by mouth 2 (two) times daily. What changed:   when to take this  reasons to take this  Allergies  Allergen Reactions  . Depo-Provera [Medroxyprogesterone] Other (See Comments)    Headaches   . Other Itching and Other (See Comments)    Patient requires 1-2 Hydroxyzine when pain meds are taken  . Vicodin [Hydrocodone-Acetaminophen] Nausea Only    Follow-up Information    Hoyt Koch, MD. Schedule an appointment as soon as possible for a visit in 1 week(s).   Specialty: Internal Medicine Why: Hospital follow-up Contact information: [place] [address] [phone]        Alda Berthold, DO. Schedule an appointment as soon as possible for a visit in 1 week(s).   Specialty: Neurology Why: Hospital follow-up Contact information: [place] [id number] [phone]                The results of significant diagnostics from this hospitalization (including imaging, microbiology, ancillary and laboratory) are listed below for reference.    Significant Diagnostic  Studies: DG Chest 2 View  Result Date: 08/13/2020 CLINICAL DATA:  45 year old female with shortness of breath and cough. EXAM: CHEST - 2 VIEW COMPARISON:  Chest radiograph dated 02/12/2018. FINDINGS: No focal consolidation, pleural effusion, or pneumothorax. The cardiac silhouette is within limits. Median sternotomy wires and mediastinal surgical clips. No acute osseous pathology. Right upper quadrant cholecystectomy clips. IMPRESSION: No active cardiopulmonary disease. Electronically Signed   By: Anner Crete M.D.   On: 08/13/2020 22:21    Microbiology: Recent Results (from the past 240 hour(s))  SARS CORONAVIRUS 2 (TAT 6-24 HRS) Nasopharyngeal Nasopharyngeal Swab     Status: None   Collection Time: 08/14/20  6:35 AM   Specimen: Nasopharyngeal Swab  Result Value Ref Range Status   SARS Coronavirus 2 NEGATIVE NEGATIVE Final    Comment: (NOTE) SARS-CoV-2 target nucleic acids are NOT DETECTED.  The SARS-CoV-2 RNA is generally detectable in upper and lower respiratory specimens during the acute phase of infection. Negative results do not preclude SARS-CoV-2 infection, do not rule out co-infections with other pathogens, and should not be used as the sole basis for treatment or other patient management decisions. Negative results must be combined with clinical observations, patient history, and epidemiological information. The expected result is Negative.  Fact Sheet for Patients: SugarRoll.be  Fact Sheet for Healthcare Providers: https://www.woods-mathews.com/  This test is not yet approved or cleared by the [place] FDA and  has been authorized for detection and/or diagnosis of SARS-CoV-2 by FDA under an Emergency Use Authorization (EUA). This EUA will remain  in effect (meaning this test can be used) for the duration of the COVID-19 declaration under Se ction 564(b)(1) of the Act, 21 U.S.C. section 360bbb-3(b)(1), unless the  authorization is terminated or revoked sooner.  Performed at [facility], [place] [address], [place], [place] [address]      Labs: Basic Metabolic Panel: Recent Labs  Lab 08/13/20 2146 08/14/20 0231 08/14/20 0742 08/15/20 0304  NA 137 136  --  137  K 3.0* 4.2  --  4.6  CL 102  --   --  109  CO2 27  --   --  21*  GLUCOSE 109*  --   --  85  BUN 16  --   --  15  CREATININE 0.63  --   --  0.53  CALCIUM 8.8*  --   --  7.8*  MG  --   --  2.1  --    Liver Function Tests: Recent Labs  Lab  08/15/20 0304  AST 20  ALT 7  ALKPHOS 49  BILITOT 1.0  PROT 6.1*  ALBUMIN 2.8*   No results for input(s): LIPASE, AMYLASE in the last 168 hours. No results for input(s): AMMONIA in the last 168 hours. CBC: Recent Labs  Lab 08/13/20 2146 08/14/20 0231 08/15/20 0707  WBC 4.6  --  4.8  NEUTROABS 2.3  --   --   HGB 11.9* 12.9 10.6*  HCT 37.9 38.0 34.3*  MCV 85.6  --  86.4  PLT 251  --  216   Cardiac Enzymes: No results for input(s): CKTOTAL, CKMB, CKMBINDEX, TROPONINI in the last 168 hours. BNP: BNP (last 3 results) No results for input(s): BNP in the last 8760 hours.  ProBNP (last 3 results) No results for input(s): PROBNP in the last 8760 hours.  CBG: Recent Labs  Lab 08/15/20 0657 08/15/20 1131 08/15/20 1615 08/16/20 0624 08/16/20 1131  GLUCAP 149* 89 92 87 98       Signed:  Paxtyn Boyar  [facility] 08/16/2020, 2:31 PM

## 2020-08-17 ENCOUNTER — Other Ambulatory Visit: Payer: Self-pay | Admitting: Internal Medicine

## 2020-08-17 LAB — HEMOGLOBIN A1C
Hgb A1c MFr Bld: 5.2 % (ref 4.8–5.6)
Mean Plasma Glucose: 103 mg/dL

## 2020-08-18 ENCOUNTER — Telehealth: Payer: Self-pay

## 2020-08-18 DIAGNOSIS — E119 Type 2 diabetes mellitus without complications: Secondary | ICD-10-CM | POA: Diagnosis not present

## 2020-08-18 DIAGNOSIS — K219 Gastro-esophageal reflux disease without esophagitis: Secondary | ICD-10-CM | POA: Diagnosis not present

## 2020-08-18 DIAGNOSIS — K589 Irritable bowel syndrome without diarrhea: Secondary | ICD-10-CM | POA: Diagnosis not present

## 2020-08-18 NOTE — Telephone Encounter (Signed)
Transition Care Management Unsuccessful Follow-up Telephone Call  Date of discharge and from where:  08/16/2020 from [facility]  Attempts:  1st Attempt  Reason for unsuccessful TCM follow-up call:  No answer/busy  Nurse Note: Attempted to contact patient to discuss transition of care from inpatient admission.  Patient did not answer the phone.  Unable to leave voicemail as it was not set up.  Will attempt to call again or follow up.

## 2020-08-19 ENCOUNTER — Telehealth: Payer: Self-pay

## 2020-08-19 NOTE — Telephone Encounter (Signed)
Transition Care Management Follow-up Telephone Call  Date of discharge and from where: 08/16/2020 from [facility]  How have you been since you were released from the hospital? I feel the same  Any questions or concerns? No  Items Reviewed:  Did the pt receive and understand the discharge instructions provided? Yes   Medications obtained and verified? Yes   Other? No   Any new allergies since your discharge? No   Dietary orders reviewed? No  Do you have support at home? Yes   Home Care and Equipment/Supplies: Were home health services ordered? no If so, what is the name of the agency? no  Has the agency set up a time to come to the patient's home? not applicable Were any new equipment or medical supplies ordered?  No What is the name of the medical supply agency? no Were you able to get the supplies/equipment? no Do you have any questions related to the use of the equipment or supplies? No  Functional Questionnaire: (I = Independent and D = Dependent) ADLs: I  Bathing/Dressing- I  Meal Prep- I  Eating- I  Maintaining continence- I  Transferring/Ambulation- I  Managing Meds- I  Follow up appointments reviewed:   PCP Hospital f/u appt confirmed? Yes  Scheduled to see Pricilla Holm, MD on 08/24/2020 @ 8:00 am.  [facility] f/u appt confirmed? No    Are transportation arrangements needed? No   If their condition worsens, is the pt aware to call PCP or go to the Emergency Dept.? Yes  Was the patient provided with contact information for the PCP's office or ED? Yes  Was to pt encouraged to call back with questions or concerns? Yes

## 2020-08-24 ENCOUNTER — Telehealth: Payer: Medicare Other | Admitting: Internal Medicine

## 2020-08-24 NOTE — Progress Notes (Signed)
Virtual Visit via Video Note  I connected with Jade Mathis on 08/24/20 at  8:00 AM EDT by a video enabled telemedicine application and verified that I am speaking with the correct person using two identifiers.  The patient and the provider were at separate locations throughout the entire encounter. Patient location: home, Provider location: work   I discussed the limitations of evaluation and management by telemedicine and the availability of in person appointments. The patient expressed understanding and agreed to proceed. The patient and the provider were the only parties present for the visit unless noted in HPI below.  History of Present Illness: The patient is a 45 y.o. female who was scheduled for telephone hospital follow up. In hospital for SOB and low BP. Needs in person visit and tried to arrange that on phone with her. She could not commit to an in person visit time and states she will call office back after noon as she is very tired right now.    I discussed the assessment and treatment plan with the patient. The patient was provided an opportunity to ask questions and all were answered. The patient agreed with the plan and demonstrated an understanding of the instructions.   The patient was advised to call back or seek an in-person evaluation if the symptoms worsen or if the condition fails to improve as anticipated.  Hoyt Koch, MD

## 2020-08-27 DIAGNOSIS — Z79899 Other long term (current) drug therapy: Secondary | ICD-10-CM | POA: Diagnosis not present

## 2020-08-27 DIAGNOSIS — E119 Type 2 diabetes mellitus without complications: Secondary | ICD-10-CM | POA: Diagnosis not present

## 2020-08-28 DIAGNOSIS — Z79899 Other long term (current) drug therapy: Secondary | ICD-10-CM | POA: Diagnosis not present

## 2020-08-28 DIAGNOSIS — M545 Low back pain, unspecified: Secondary | ICD-10-CM | POA: Diagnosis not present

## 2020-08-28 DIAGNOSIS — G8929 Other chronic pain: Secondary | ICD-10-CM | POA: Diagnosis not present

## 2020-08-28 DIAGNOSIS — L299 Pruritus, unspecified: Secondary | ICD-10-CM | POA: Diagnosis not present

## 2020-09-03 DIAGNOSIS — Z79899 Other long term (current) drug therapy: Secondary | ICD-10-CM | POA: Diagnosis not present

## 2020-09-10 ENCOUNTER — Telehealth: Payer: Self-pay | Admitting: Internal Medicine

## 2020-09-10 NOTE — Telephone Encounter (Signed)
   Patient requesting order for labs She states getting labwork was discussed during last visit

## 2020-09-10 NOTE — Telephone Encounter (Signed)
LVM asking the patient to call our office. Office number was provided.

## 2020-09-17 ENCOUNTER — Telehealth: Payer: Self-pay | Admitting: Internal Medicine

## 2020-09-17 NOTE — Telephone Encounter (Signed)
Type of form received: Medical Necessity    Additional comments:   Received by: Fax  Form should be Faxed to: [phone]  Form should be mailed to:  N/A  Is patient requesting call for pickup: N   Form placed in the Provider's box.  *Attach charge sheet.  Provider will determine charge.*  Was patient informed of  7-10 business day turn around (Y/N)? N

## 2020-09-17 NOTE — Telephone Encounter (Signed)
Noted  

## 2020-09-21 ENCOUNTER — Telehealth: Payer: Self-pay | Admitting: Internal Medicine

## 2020-09-21 DIAGNOSIS — Z79899 Other long term (current) drug therapy: Secondary | ICD-10-CM | POA: Diagnosis not present

## 2020-09-21 DIAGNOSIS — K219 Gastro-esophageal reflux disease without esophagitis: Secondary | ICD-10-CM | POA: Diagnosis not present

## 2020-09-21 DIAGNOSIS — Z20822 Contact with and (suspected) exposure to covid-19: Secondary | ICD-10-CM | POA: Diagnosis not present

## 2020-09-21 DIAGNOSIS — K589 Irritable bowel syndrome without diarrhea: Secondary | ICD-10-CM | POA: Diagnosis not present

## 2020-09-21 DIAGNOSIS — Z79891 Long term (current) use of opiate analgesic: Secondary | ICD-10-CM | POA: Diagnosis not present

## 2020-09-21 DIAGNOSIS — E119 Type 2 diabetes mellitus without complications: Secondary | ICD-10-CM | POA: Diagnosis not present

## 2020-09-21 NOTE — Telephone Encounter (Signed)
LVM for pt to rtn my call to schedule AWV with [facility]. Please schedule AWV if pt calls the office  

## 2020-09-22 DIAGNOSIS — M545 Low back pain, unspecified: Secondary | ICD-10-CM | POA: Diagnosis not present

## 2020-09-22 DIAGNOSIS — G8929 Other chronic pain: Secondary | ICD-10-CM | POA: Diagnosis not present

## 2020-09-22 DIAGNOSIS — Z79899 Other long term (current) drug therapy: Secondary | ICD-10-CM | POA: Diagnosis not present

## 2020-09-22 DIAGNOSIS — M5412 Radiculopathy, cervical region: Secondary | ICD-10-CM | POA: Diagnosis not present

## 2020-09-26 ENCOUNTER — Other Ambulatory Visit: Payer: Self-pay | Admitting: Neurology

## 2020-09-26 DIAGNOSIS — Z79899 Other long term (current) drug therapy: Secondary | ICD-10-CM | POA: Diagnosis not present

## 2020-09-29 ENCOUNTER — Ambulatory Visit (HOSPITAL_COMMUNITY)
Admission: EM | Admit: 2020-09-29 | Discharge: 2020-09-29 | Disposition: A | Discharge status: Home / Self Care | Payer: Medicare Other | Attending: Registered Nurse | Admitting: Registered Nurse

## 2020-09-29 ENCOUNTER — Encounter (HOSPITAL_COMMUNITY): Payer: Self-pay | Admitting: Registered Nurse

## 2020-09-29 ENCOUNTER — Other Ambulatory Visit: Payer: Self-pay

## 2020-09-29 DIAGNOSIS — F4323 Adjustment disorder with mixed anxiety and depressed mood: Secondary | ICD-10-CM | POA: Diagnosis not present

## 2020-09-29 DIAGNOSIS — R45851 Suicidal ideations: Secondary | ICD-10-CM | POA: Diagnosis present

## 2020-09-29 DIAGNOSIS — Z560 Unemployment, unspecified: Secondary | ICD-10-CM | POA: Diagnosis not present

## 2020-09-29 NOTE — ED Provider Notes (Addendum)
Behavioral Health Urgent Care Medical Screening Exam  Patient Name: Shatasia Cutshaw MRN: [id number] Date of Evaluation: 09/29/20 Chief Complaint:   Diagnosis:  Final diagnoses:  Adjustment disorder with mixed anxiety and depressed mood    History of Present illness: Timiyah Romito is a 45 y.o. female patient presented to [facility] as a walk in with complaints of anxiety and depression.  Lyndel Pleasure, 45 y.o., female patient seen face to face by this provider, consulted with Dr. Ernie Hew ; and chart reviewed on 09/29/20.  On evaluation Orena Cavazos reports she was recently fired from a job because of an incident that occurred with 2 other employees.  Patient reports she feels she has been wrong and does not understand why she was let go.  Patient also upset because her child was there in daycare and has also been"kicked out."  Patient reported she needs someone to talk to and is looking for counseling services to help her work through what is happening.  Patient reporting she has had some passive suicidal thoughts basically not caring if she wakes.  States she has no intent or plan.  Patient also denies homicidal ideation reporting she has had thoughts but no intent or plan.  Patient reports she lives with her 3 children.  Currently unemployed related to being let go by her job and now having to pay out-of-pocket for daycare. During evaluation Tarryn Paytyn Mesta is sitting upright in chair in no acute distress.  She is alert, oriented x 4, calm and cooperative.  Her mood is dysphoric with congruent affect.  She does not appear to be responding to internal/external stimuli or delusional thoughts.  Patient denies suicidal/self-harm/homicidal ideation, psychosis, and paranoia.  Patient answered question appropriately.    [place] will reach out to family, call 24 or call mobile crisis if condition worsens or if suicidal thoughts become  active Patients' will follow up with with resources given for outpatient psychiatric services (therapy/medication management).  The suicide prevention education provided includes the following: Suicide risk factors Suicide prevention and interventions National Suicide Hotline telephone number [facility] assessment telephone number [facility] Emergency Assistance [place] and/or Residential Mobile Crisis Unit telephone number Request made to patient : Remove weapons (e.g., guns, rifles, knives), all items previously/currently identified as safety concern.   Remove drugs/medications (over the counter, prescriptions, illicit drugs), all items previously/currently identified as a safety concern.     Psychiatric Specialty Exam  Presentation  General Appearance:Appropriate for Environment  Eye Contact:Fair Speech:Clear and Coherent; Normal Rate Speech Volume:Normal Handedness:Right  Mood and Affect  Mood: Anxious; Dysphoric Affect: Congruent  Thought Process  Thought Processes: Coherent; Goal Directed Descriptions of Associations:Intact Orientation:Full (Time, Place and Person) Thought Content:WDL   Hallucinations:None Ideas of Reference:None Suicidal Thoughts:No data recorded Homicidal Thoughts:Yes, Passive Without Intent; Without Plan (Patient reporting she is just had some fleeting thoughts.  But no plan or intent stating she is got 3 children at home that she has to care for.)  Sensorium  Memory: Immediate Good; Recent Good; Remote Good Judgment: Intact Insight: Present  Executive Functions  Concentration: Good Attention Span: Good Recall: Good Fund of Knowledge: Good Language: Good  Psychomotor Activity  Psychomotor Activity: Normal  Assets  Assets: Communication Skills; Desire for Improvement; Housing; Resilience; Social Support  Sleep  Sleep: Fair Number of hours:  No data recorded  Nutritional Assessment (For OBS  and FBC admissions only) Has the patient had a weight loss  or gain of 10 pounds or more in the last 3 months?: No Has the patient had a decrease in food intake/or appetite?: No Does the patient have dental problems?: No Does the patient have eating habits or behaviors that may be indicators of an eating disorder including binging or inducing vomiting?: No Has the patient recently lost weight without trying?: No Has the patient been eating poorly because of a decreased appetite?: No Malnutrition Screening Tool Score: 0   Physical Exam: Physical Exam Vitals and nursing note reviewed. Exam conducted with a chaperone present.  Constitutional:      General: She is not in acute distress.    Appearance: Normal appearance. She is not ill-appearing.  Cardiovascular:     Rate and Rhythm: Normal rate.  Pulmonary:     Effort: Pulmonary effort is normal.  Musculoskeletal:        General: Normal range of motion.     Cervical back: Normal range of motion.  Skin:    General: Skin is warm and dry.  Neurological:     Mental Status: She is alert and oriented to person, place, and time.  Psychiatric:        Attention and Perception: Attention and perception normal. She does not perceive auditory or visual hallucinations.        Mood and Affect: Mood is anxious and depressed.        Speech: Speech normal.        Behavior: Behavior normal. Behavior is cooperative.        Thought Content: Thought content normal. Thought content is not paranoid or delusional. Thought content does not include homicidal or suicidal ideation.        Cognition and Memory: Cognition and memory normal.        Judgment: Judgment normal.   Review of Systems  Constitutional: Negative.   HENT: Negative.    Eyes: Negative.   Respiratory: Negative.    Cardiovascular: Negative.   Gastrointestinal: Negative.   Genitourinary: Negative.   Musculoskeletal:  Positive for joint pain and myalgias.  Skin: Negative.   Neurological:  Negative.   Endo/Heme/Allergies: Negative.   Psychiatric/Behavioral:  Negative for substance abuse. Depression: Stable. Hallucinations: Denies. Suicidal ideas: Denies.Nervous/anxious: Increase in anxiety related to losing job up.   Blood pressure 99/73, pulse (!) 18, temperature 98.3 F (36.8 C), temperature source Oral, resp. rate 18, SpO2 100 %. There is no height or weight on file to calculate BMI.  Musculoskeletal: Strength & Muscle Tone: within normal limits Gait & Station:  Uses a cane to help with ambulation Patient leans: N/A   [place] [facility] Discharge Disposition for Follow up and Recommendations: Based on my evaluation the patient does not appear to have an emergency medical condition and can be discharged with resources and follow up care in outpatient services for Medication Management, Substance Abuse Intensive Outpatient Program, Individual Therapy, and Group Therapy       Follow-up Information     Go to  [facility].   Specialty: Urgent Care Why: Open Access:  Monday - Thursday from 8 am to 11 am for medication management and therapy intake.  On Friday from 1 pm to 4 pm for therapy intake only Contact information: [place] [phone]        Call  [facility].   Specialty: Medical illustrator Why: schedule an appointment for medication management and therapy Contact information: [facility]  [place] [address] [address].   Why: Walk in hours Monday thru Thursday 8:00 AM to 3:00 PM first come first serve Contact information: [place]  [place] [address] [phone]         Call  [place].   Why: In-office and online appointments available Contact information: [place] [address] [phone]                  Earleen Newport, NP 09/29/2020, 1:39 PM

## 2020-09-29 NOTE — ED Notes (Signed)
Pt discharged in no acute distress. Verbalized understanding of instructions printed out on AVS for follow up care and resources provided. Safety maintained.

## 2020-09-29 NOTE — BH Assessment (Signed)
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 Pt has outpt tx set up with [facility] and has a regularly-scheduled appt she will attend tomorrow. At this time pt states she just wanted to talk about it. She denies substance abuse and current SI, HI and AVH.

## 2020-10-01 ENCOUNTER — Other Ambulatory Visit: Payer: Self-pay

## 2020-10-01 ENCOUNTER — Telehealth (HOSPITAL_COMMUNITY): Payer: Self-pay | Admitting: Internal Medicine

## 2020-10-01 ENCOUNTER — Encounter: Payer: Self-pay | Admitting: Internal Medicine

## 2020-10-01 ENCOUNTER — Ambulatory Visit (INDEPENDENT_AMBULATORY_CARE_PROVIDER_SITE_OTHER): Payer: Medicare Other | Admitting: Internal Medicine

## 2020-10-01 DIAGNOSIS — F4323 Adjustment disorder with mixed anxiety and depressed mood: Secondary | ICD-10-CM | POA: Diagnosis not present

## 2020-10-01 DIAGNOSIS — N3941 Urge incontinence: Secondary | ICD-10-CM

## 2020-10-01 DIAGNOSIS — R32 Unspecified urinary incontinence: Secondary | ICD-10-CM | POA: Insufficient documentation

## 2020-10-01 DIAGNOSIS — G7001 Myasthenia gravis with (acute) exacerbation: Secondary | ICD-10-CM | POA: Diagnosis not present

## 2020-10-01 NOTE — Assessment & Plan Note (Signed)
With severe flare and unable to get in with her psych provider. Advised to go to ER for both this and worsening MG possible flare of that.

## 2020-10-01 NOTE — Progress Notes (Signed)
   Subjective:   Patient ID: Jade Mathis, female    DOB: February 25, 1976, 45 y.o.   MRN: [id number]  HPI The patient is a 45 YO female coming in for multiple concerns including mental health crisis (dealing with loss of job due to unjust grounds and possible racial discrimination involved which is very stressful, she is struggling to pay bills, she called crisis line and they did talk to her, [facility], she is struggling with some passive SI thoughts but no plan) and possible myasthenia crisis (she is having severe SOB even on flat ground which she does not normally have, feels like flare up in May 2022 when she was admitted).   Review of Systems  Constitutional:  Positive for activity change, appetite change, fatigue and unexpected weight change.  HENT: Negative.    Eyes: Negative.   Respiratory:  Negative for cough, chest tightness and shortness of breath.   Cardiovascular:  Negative for chest pain, palpitations and leg swelling.  Gastrointestinal:  Positive for abdominal pain and nausea. Negative for abdominal distention, constipation, diarrhea and vomiting.  Musculoskeletal:  Positive for arthralgias and back pain.  Skin: Negative.   Neurological:  Positive for weakness, light-headedness and headaches.  Psychiatric/Behavioral:  Positive for agitation, decreased concentration, dysphoric mood, sleep disturbance and suicidal ideas. Negative for self-injury. The patient is nervous/anxious.    Objective:  Physical Exam Constitutional:      Appearance: She is well-developed.  HENT:     Head: Normocephalic and atraumatic.  Cardiovascular:     Rate and Rhythm: Normal rate and regular rhythm.  Pulmonary:     Effort: Pulmonary effort is normal. No respiratory distress.     Breath sounds: Normal breath sounds. No wheezing or rales.     Comments: Some chest muscle weakness with walking on flat ground Abdominal:     General: Bowel sounds are  normal. There is no distension.     Palpations: Abdomen is soft.     Tenderness: There is no abdominal tenderness. There is no rebound.  Musculoskeletal:     Cervical back: Normal range of motion.  Skin:    General: Skin is warm and dry.  Neurological:     Mental Status: She is alert and oriented to person, place, and time.     Coordination: Coordination abnormal.     Comments: Cane, significant weakness with walking which is much worse than usual    Vitals:   10/01/20 1516  BP: 140/80  Pulse: 88  Resp: 18  Temp: 99.1 F (37.3 C)  TempSrc: Oral  SpO2: 98%  Weight: 144 lb 12.8 oz (65.7 kg)  Height: 5\' 6"  (1.676 m)    This visit occurred during the SARS-CoV-2 public health emergency.  Safety protocols were in place, including screening questions prior to the visit, additional usage of staff PPE, and extensive cleaning of exam room while observing appropriate contact time as indicated for disinfecting solutions.   Assessment & Plan:

## 2020-10-01 NOTE — Assessment & Plan Note (Signed)
Concern for crisis today given worsening SOB and less stamina. Advised to go to ER for further evaluation.

## 2020-10-01 NOTE — Patient Instructions (Signed)
We will have you go to the ER.

## 2020-10-01 NOTE — Telephone Encounter (Signed)
10/01/20 10:10am The patient called stating that she is very upset - stating that she went to [facility] on Wednesday because she was having a crisis she was very upset and angry she stated that she received no help when she arrived the staff was rude and not willing to assist in anyway she felt that it was a racial issue because first she started talking with a white person then the white person had a black person over to talk with her.  The patient also stated that she was fired from her job due to a white person - she stated that she spent $25.00 for transportation and receive no help and to have a commercial on television stating that [facility] will help you and there is resources is a bunch of bull.  Patient stated she left feeling worse and felt like killing herself but she has kids so she called that Crisis Hotline.  As the patient talked you could feel the hurt and disappointment in her voice. I gave her Rick Duff (Clinical Staff Nurse Manager) contact [phone] and Lillia Mountain (Director Physician Practices)  [phone].  In addition I gave her 2-resources [facility] and [facility].  The patient stated that she has a psychiatrist and a therapist but her therapist is out of the country for a month, and she needed to talk to someone now, because of what has happen recently on her job.

## 2020-10-01 NOTE — Assessment & Plan Note (Signed)
Needs refill of incontinence supplies.

## 2020-10-02 ENCOUNTER — Emergency Department (HOSPITAL_COMMUNITY)
Admission: EM | Admit: 2020-10-02 | Discharge: 2020-10-02 | Disposition: A | Discharge status: Home / Self Care | Payer: Medicare Other | Attending: Emergency Medicine | Admitting: Emergency Medicine

## 2020-10-02 ENCOUNTER — Other Ambulatory Visit: Payer: Self-pay

## 2020-10-02 ENCOUNTER — Emergency Department (HOSPITAL_COMMUNITY): Payer: Medicare Other

## 2020-10-02 ENCOUNTER — Encounter (HOSPITAL_COMMUNITY): Payer: Self-pay | Admitting: Emergency Medicine

## 2020-10-02 DIAGNOSIS — N9489 Other specified conditions associated with female genital organs and menstrual cycle: Secondary | ICD-10-CM | POA: Diagnosis not present

## 2020-10-02 DIAGNOSIS — E890 Postprocedural hypothyroidism: Secondary | ICD-10-CM | POA: Diagnosis not present

## 2020-10-02 DIAGNOSIS — E1169 Type 2 diabetes mellitus with other specified complication: Secondary | ICD-10-CM | POA: Insufficient documentation

## 2020-10-02 DIAGNOSIS — Z7951 Long term (current) use of inhaled steroids: Secondary | ICD-10-CM | POA: Insufficient documentation

## 2020-10-02 DIAGNOSIS — J45909 Unspecified asthma, uncomplicated: Secondary | ICD-10-CM | POA: Diagnosis not present

## 2020-10-02 DIAGNOSIS — R0789 Other chest pain: Secondary | ICD-10-CM

## 2020-10-02 DIAGNOSIS — Z79899 Other long term (current) drug therapy: Secondary | ICD-10-CM | POA: Insufficient documentation

## 2020-10-02 DIAGNOSIS — R5383 Other fatigue: Secondary | ICD-10-CM | POA: Diagnosis not present

## 2020-10-02 DIAGNOSIS — F32A Depression, unspecified: Secondary | ICD-10-CM | POA: Diagnosis not present

## 2020-10-02 DIAGNOSIS — R45851 Suicidal ideations: Secondary | ICD-10-CM | POA: Diagnosis not present

## 2020-10-02 DIAGNOSIS — Z8616 Personal history of COVID-19: Secondary | ICD-10-CM | POA: Insufficient documentation

## 2020-10-02 DIAGNOSIS — R079 Chest pain, unspecified: Secondary | ICD-10-CM | POA: Diagnosis not present

## 2020-10-02 DIAGNOSIS — E785 Hyperlipidemia, unspecified: Secondary | ICD-10-CM | POA: Diagnosis not present

## 2020-10-02 DIAGNOSIS — R0600 Dyspnea, unspecified: Secondary | ICD-10-CM | POA: Insufficient documentation

## 2020-10-02 DIAGNOSIS — F329 Major depressive disorder, single episode, unspecified: Secondary | ICD-10-CM

## 2020-10-02 DIAGNOSIS — R531 Weakness: Secondary | ICD-10-CM | POA: Diagnosis not present

## 2020-10-02 DIAGNOSIS — Z794 Long term (current) use of insulin: Secondary | ICD-10-CM | POA: Insufficient documentation

## 2020-10-02 LAB — TROPONIN I (HIGH SENSITIVITY)
Troponin I (High Sensitivity): <2 ng/L (ref <18)
Troponin I (High Sensitivity): <2 ng/L (ref <18)

## 2020-10-02 LAB — BASIC METABOLIC PANEL
Anion gap: 8 (ref 5–15)
BUN: 15 mg/dL (ref 6–20)
CO2: 25 mmol/L (ref 22–32)
Calcium: 8.6 mg/dL — ABNORMAL LOW (ref 8.9–10.3)
Chloride: 107 mmol/L (ref 98–111)
Creatinine, Ser: 0.8 mg/dL (ref 0.44–1.00)
GFR, Estimated: >60 mL/min (ref >60)
Glucose, Bld: 175 mg/dL — ABNORMAL HIGH (ref 70–99)
Potassium: 3.3 mmol/L — ABNORMAL LOW (ref 3.5–5.1)
Sodium: 140 mmol/L (ref 135–145)

## 2020-10-02 LAB — CBC
HCT: 36.8 % (ref 36.0–46.0)
Hemoglobin: 11.5 g/dL — ABNORMAL LOW (ref 12.0–15.0)
MCH: 26.9 pg (ref 26.0–34.0)
MCHC: 31.3 g/dL (ref 30.0–36.0)
MCV: 86 fL (ref 80.0–100.0)
Platelets: 247 10*3/uL (ref 150–400)
RBC: 4.28 MIL/uL (ref 3.87–5.11)
RDW: 13.2 % (ref 11.5–15.5)
WBC: 4.2 10*3/uL (ref 4.0–10.5)
nRBC: 0 % (ref 0.0–0.2)

## 2020-10-02 LAB — BRAIN NATRIURETIC PEPTIDE: B Natriuretic Peptide: 19.9 pg/mL (ref 0.0–100.0)

## 2020-10-02 LAB — I-STAT BETA HCG BLOOD, ED (MC, WL, AP ONLY): I-stat hCG, quantitative: <5 m[IU]/mL (ref <5)

## 2020-10-02 MED ORDER — PREDNISONE 20 MG PO TABS: 10.0000 mg | ORAL_TABLET | Freq: Every day | ORAL | Status: DC

## 2020-10-02 MED ORDER — LINACLOTIDE 145 MCG PO CAPS: 290.0000 ug | ORAL_CAPSULE | Freq: Every day | ORAL | Status: DC

## 2020-10-02 MED ORDER — HYDROXYZINE HCL 10 MG PO TABS: 10.0000 mg | ORAL_TABLET | Freq: Three times a day (TID) | ORAL | Status: DC | PRN

## 2020-10-02 MED ORDER — DULOXETINE HCL 60 MG PO CPEP: 60.0000 mg | ORAL_CAPSULE | Freq: Every day | ORAL | Status: DC

## 2020-10-02 MED ORDER — PRAZOSIN HCL 1 MG PO CAPS: 1.0000 mg | ORAL_CAPSULE | Freq: Every day | ORAL | Status: DC

## 2020-10-02 MED ORDER — QUETIAPINE FUMARATE 200 MG PO TABS: 200.0000 mg | ORAL_TABLET | Freq: Every day | ORAL | Status: DC

## 2020-10-02 MED ORDER — PANTOPRAZOLE SODIUM 40 MG PO TBEC: 40.0000 mg | DELAYED_RELEASE_TABLET | Freq: Every day | ORAL | Status: DC

## 2020-10-02 MED ORDER — ONDANSETRON 4 MG PO TBDP: 4.0000 mg | ORAL_TABLET | Freq: Three times a day (TID) | ORAL | Status: DC | PRN

## 2020-10-02 MED ORDER — FERRALET 90 90-1 MG PO TABS: 1.0000 | ORAL_TABLET | Freq: Every day | ORAL | Status: DC

## 2020-10-02 MED ORDER — AZATHIOPRINE 50 MG PO TABS: 150.0000 mg | ORAL_TABLET | Freq: Every morning | ORAL | Status: DC

## 2020-10-02 MED ORDER — CYCLOBENZAPRINE HCL 10 MG PO TABS: 10.0000 mg | ORAL_TABLET | Freq: Three times a day (TID) | ORAL | Status: DC | PRN

## 2020-10-02 MED ORDER — LEVOTHYROXINE SODIUM 25 MCG PO TABS: 137.0000 ug | ORAL_TABLET | Freq: Every day | ORAL | Status: DC

## 2020-10-02 MED ORDER — GABAPENTIN 300 MG PO CAPS: 400.0000 mg | ORAL_CAPSULE | Freq: Three times a day (TID) | ORAL | Status: DC

## 2020-10-02 MED ORDER — ALBUTEROL SULFATE (2.5 MG/3ML) 0.083% IN NEBU: 2.5000 mg | INHALATION_SOLUTION | RESPIRATORY_TRACT | Status: DC | PRN

## 2020-10-02 MED ORDER — SUMATRIPTAN SUCCINATE 100 MG PO TABS: 100.0000 mg | ORAL_TABLET | ORAL | Status: DC | PRN

## 2020-10-02 MED ORDER — PHENTERMINE HCL 37.5 MG PO TABS: 37.5000 mg | ORAL_TABLET | Freq: Every day | ORAL | Status: DC

## 2020-10-02 MED ORDER — PYRIDOSTIGMINE BROMIDE 60 MG PO TABS: 60.0000 mg | ORAL_TABLET | Freq: Four times a day (QID) | ORAL | Status: DC

## 2020-10-02 MED ORDER — EMPAGLIFLOZIN 10 MG PO TABS: 10.0000 mg | ORAL_TABLET | Freq: Every day | ORAL | Status: DC

## 2020-10-02 MED ORDER — OXYCODONE HCL 5 MG PO TABS: 15.0000 mg | ORAL_TABLET | Freq: Four times a day (QID) | ORAL | Status: DC

## 2020-10-02 NOTE — ED Notes (Signed)
Pt d/c home per MD order , discharge summary reviewed with pt, pt verbalizes understanding. No s/s of acute distress noted at discharge.

## 2020-10-02 NOTE — ED Triage Notes (Signed)
Patient coming from home. Complaint of intermittent chest tightness and headache since being terminated from job on July 1st. VSS.
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
## 2020-10-02 NOTE — Discharge Instructions (Signed)
As discussed, your evaluation today has been largely reassuring.  But, it is important that you monitor your condition carefully, and do not hesitate to return to the ED if you develop new, or concerning changes in your condition. ? ?Otherwise, please follow-up with your physician for appropriate ongoing care. ? ?

## 2020-10-08 ENCOUNTER — Other Ambulatory Visit: Payer: Self-pay

## 2020-10-08 ENCOUNTER — Encounter: Payer: Self-pay | Admitting: Neurology

## 2020-10-08 ENCOUNTER — Telehealth (INDEPENDENT_AMBULATORY_CARE_PROVIDER_SITE_OTHER): Payer: Medicare Other | Admitting: Neurology

## 2020-10-08 VITALS — Ht 66.0 in | Wt 144.0 lb

## 2020-10-08 DIAGNOSIS — G7000 Myasthenia gravis without (acute) exacerbation: Secondary | ICD-10-CM

## 2020-10-08 NOTE — Progress Notes (Signed)
Virtual Visit via Video Note The purpose of this virtual visit is to provide medical care while limiting exposure to the novel coronavirus.  Patient was unable to connect via video, so transitioned to telephone.  Consent was obtained for video visit:  Yes.   Answered questions that patient had about telehealth interaction:  Yes.   I discussed the limitations, risks, security and privacy concerns of performing an evaluation and management service by telemedicine. I also discussed with the patient that there may be a patient responsible charge related to this service. The patient expressed understanding and agreed to proceed.  Pt location: Home Physician Location: office Name of referring provider:  Hoyt Koch, * I connected with Lyndel Pleasure at patients initiation/request on 10/08/2020 at  8:30 AM EDT by video enabled telemedicine application and verified that I am speaking with the correct person using two identifiers. Pt MRN:  [id number] Pt DOB:  Jul 18, 1975 Video Participants:  Lyndel Pleasure   History of Present Illness: This is a 45 y.o. female returning for follow-up of myasthenia gravis.  She was hospitalized in May for possible myasthenia exacerbation after presenting with shortness of breath, chest pain, and dizziness. Exam was noted to be discordant, She received IVIG and felt it improved her breathing.  She remains on prednisone '10mg'$ , azathioprine '150mg'$ /d, and mestinon '60mg'$  four times daily.  He reports ongoing feeling of being unwell, generalized weakness.  She was evaluated in the ER on 7/16 because of generalized weakness, shortness of breath, fatigue, and chest pain.  Symptoms felt to be due to despondency secondary to recent job loss.  She was also evaluated at behavioral health urgent care on 7/13 and was scheduled to follow-up with therapy.  Patient was scheduled for a video visit and states that she was in the bathroom and wanted to do it over the phone.  I  offered to reschedule the visit, which was declined.  She requested to do a telephone visit.    Observations/Objective:   Vitals:   10/08/20 0802  Weight: 144 lb (65.3 kg)  Height: '5\' 6"'$  (1.676 m)   Speech is clear, no dysarthria. No breathlessness.    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  Previously hospitalized for MG exacerbation in 1997 (PLEX), 2011 (PLEX), 2016 (IVIG), 2022 (IVIG).   - Patient has not been evaluated in the office since 2020 and reports ongoing issues with fatigue and generalized weakness.   Patient claims to be in a "crisis" and states that I have dismissed her symptoms and told her that her weakness was due to underlying stress/mood.  I explained to her that I cannot assess her for weakness over the phone and she would need an in-office evaluation to evaluate for true myasthenia exacerbation, or she can go to the ER, if she truly feels that her myasthenia is worse.   - As I have stressed in the past, it is very important to determine factors that can make myasthenia "feel worse", and a true exacerbation, as each is managed differently.    - Patient did not agree with my clinical judgement.   - I offered to see her in the office, but if there is ongoing mistrust our relationship, she will need to transfer care to another neurologist.   - For now, she will remain on prednisone '10mg'$ , azathioprine '150mg'$ /d, and mestinon '60mg'$  four times daily.    Follow Up Instructions:   I discussed the assessment and treatment  plan with the patient. The patient was provided an opportunity to ask questions and all were answered. The patient agreed with the plan and demonstrated an understanding of the instructions.   The patient was advised to call back or seek an in-person evaluation if the symptoms worsen or if the condition fails to improve as anticipated.  My office will contact patient to schedule in-office visit for  evaluation  Total time spent:  22 minutes     [place], DO

## 2020-10-15 DIAGNOSIS — M5412 Radiculopathy, cervical region: Secondary | ICD-10-CM | POA: Diagnosis not present

## 2020-10-15 DIAGNOSIS — Z79899 Other long term (current) drug therapy: Secondary | ICD-10-CM | POA: Diagnosis not present

## 2020-10-15 DIAGNOSIS — R5383 Other fatigue: Secondary | ICD-10-CM | POA: Diagnosis not present

## 2020-10-15 DIAGNOSIS — G8929 Other chronic pain: Secondary | ICD-10-CM | POA: Diagnosis not present

## 2020-10-15 DIAGNOSIS — Z1159 Encounter for screening for other viral diseases: Secondary | ICD-10-CM | POA: Diagnosis not present

## 2020-10-15 DIAGNOSIS — E7800 Pure hypercholesterolemia, unspecified: Secondary | ICD-10-CM | POA: Diagnosis not present

## 2020-10-15 DIAGNOSIS — M129 Arthropathy, unspecified: Secondary | ICD-10-CM | POA: Diagnosis not present

## 2020-10-15 DIAGNOSIS — E559 Vitamin D deficiency, unspecified: Secondary | ICD-10-CM | POA: Diagnosis not present

## 2020-10-15 DIAGNOSIS — M545 Low back pain, unspecified: Secondary | ICD-10-CM | POA: Diagnosis not present

## 2020-10-18 DIAGNOSIS — K219 Gastro-esophageal reflux disease without esophagitis: Secondary | ICD-10-CM | POA: Diagnosis not present

## 2020-10-18 DIAGNOSIS — K589 Irritable bowel syndrome without diarrhea: Secondary | ICD-10-CM | POA: Diagnosis not present

## 2020-10-18 DIAGNOSIS — E119 Type 2 diabetes mellitus without complications: Secondary | ICD-10-CM | POA: Diagnosis not present

## 2020-10-20 DIAGNOSIS — L299 Pruritus, unspecified: Secondary | ICD-10-CM | POA: Diagnosis not present

## 2020-10-20 DIAGNOSIS — M545 Low back pain, unspecified: Secondary | ICD-10-CM | POA: Diagnosis not present

## 2020-10-20 DIAGNOSIS — G8929 Other chronic pain: Secondary | ICD-10-CM | POA: Diagnosis not present

## 2020-10-20 DIAGNOSIS — Z79899 Other long term (current) drug therapy: Secondary | ICD-10-CM | POA: Diagnosis not present

## 2020-10-26 DIAGNOSIS — Z9884 Bariatric surgery status: Secondary | ICD-10-CM | POA: Diagnosis not present

## 2020-10-26 DIAGNOSIS — E039 Hypothyroidism, unspecified: Secondary | ICD-10-CM | POA: Diagnosis not present

## 2020-10-26 DIAGNOSIS — E1140 Type 2 diabetes mellitus with diabetic neuropathy, unspecified: Secondary | ICD-10-CM | POA: Diagnosis not present

## 2020-10-26 DIAGNOSIS — G7000 Myasthenia gravis without (acute) exacerbation: Secondary | ICD-10-CM | POA: Diagnosis not present

## 2020-11-01 ENCOUNTER — Encounter: Payer: Self-pay | Admitting: Internal Medicine

## 2020-11-01 ENCOUNTER — Telehealth (INDEPENDENT_AMBULATORY_CARE_PROVIDER_SITE_OTHER): Payer: Medicare Other | Admitting: Internal Medicine

## 2020-11-01 ENCOUNTER — Other Ambulatory Visit: Payer: Self-pay

## 2020-11-01 DIAGNOSIS — E039 Hypothyroidism, unspecified: Secondary | ICD-10-CM | POA: Diagnosis not present

## 2020-11-01 DIAGNOSIS — J019 Acute sinusitis, unspecified: Secondary | ICD-10-CM | POA: Diagnosis not present

## 2020-11-01 MED ORDER — AZITHROMYCIN 250 MG PO TABS: ORAL_TABLET | ORAL | 0 refills | Status: DC

## 2020-11-01 NOTE — Progress Notes (Signed)
Virtual Visit via Video Note  I connected with Jade Mathis on 11/01/20 at  2:00 PM EDT by a video enabled telemedicine application and verified that I am speaking with the correct person using two identifiers.   I discussed the limitations of evaluation and management by telemedicine and the availability of in person appointments. The patient expressed understanding and agreed to proceed.  Present for the visit:  Myself, Dr Billey Gosling, [name].  The patient is currently at work and I am in the office.    No referring provider.    History of Present Illness: She is here for an acute visit for cold symptoms.   Her symptoms started Saturday night - two nights ago  She is experiencing congestion, mild sinus pain, sore throat and postnasal drainage.  She states a little cough and wheezing.  She is having regular heartburn.  She denies any shortness of breath, fevers or headaches.   She has been using her inhaler.  She states this is typical of her symptoms when she gets sinus infections and she was concerned about her asthma getting worse.  She denies any COVID exposure and has not tested herself.   Review of Systems  Constitutional:  Negative for chills and fever.  HENT:  Positive for congestion, sinus pain and sore throat (mild). Negative for ear pain.        PND  Respiratory:  Positive for cough (little) and wheezing (little). Negative for shortness of breath.   Cardiovascular:  Negative for chest pain.  Gastrointestinal:  Positive for heartburn. Negative for nausea.  Neurological:  Negative for dizziness and headaches.     Social History   Socioeconomic History   Marital status: Married    Spouse name: Not on file   Number of children: 4   Years of education: 12   Highest education level: Not on file  Occupational History   Occupation: disabled    Employer: [employer]  Tobacco Use   Smoking status: Never   Smokeless tobacco: Never  Vaping Use    Vaping Use: Never used  Substance and Sexual Activity   Alcohol use: No    Alcohol/week: 0.0 standard drinks   Drug use: No   Sexual activity: Not Currently    Partners: Male    Birth control/protection: None  Other Topics Concern   Not on file  Social History Narrative   Left handed   One story home   Lives with husband and children.  Does not work at this time.      Disabled      Bipolar      Social Determinants of Health   Financial Resource Strain: Not on file  Food Insecurity: No Food Insecurity   Worried About Charity fundraiser in the Last Year: Never true   Ran Out of Food in the Last Year: Never true  Transportation Needs: No Transportation Needs   Lack of Transportation (Medical): No   Lack of Transportation (Non-Medical): No  Physical Activity: Not on file  Stress: Not on file  Social Connections: Not on file     Observations/Objective: Appears well in NAD Breathing normally  Assessment and Plan:  Acute sinus infection: Acute Her symptoms are consistent with a possible sinus infection and consistent with her previous sinus infections At this time her asthma is not exacerbated Discussed that her symptoms could possibly also be consistent with COVID and stressed that she needs to get a COVID test-especially with her  immunocompromise state and the fact that we can prescribe an antiviral if necessary She will try to get a COVID test today or tomorrow at the latest We will go ahead and start a Z-Pak since she is convinced this is more likely her typical sinus infection and with her asthma and immunocompromise state I think it is reasonable to get her going on an antibiotic as soon as possible  Continue inhalers Call if positive for COVID, no improvement or with concerns   Follow Up Instructions:    I discussed the assessment and treatment plan with the patient. The patient was provided an opportunity to ask questions and all were answered. The patient  agreed with the plan and demonstrated an understanding of the instructions.   The patient was advised to call back or seek an in-person evaluation if the symptoms worsen or if the condition fails to improve as anticipated.    Jade Rail, MD

## 2020-11-10 DIAGNOSIS — Z1231 Encounter for screening mammogram for malignant neoplasm of breast: Secondary | ICD-10-CM | POA: Diagnosis not present

## 2020-11-15 ENCOUNTER — Emergency Department (HOSPITAL_COMMUNITY): Payer: Medicare Other

## 2020-11-15 ENCOUNTER — Encounter (HOSPITAL_COMMUNITY): Payer: Self-pay | Admitting: General Surgery

## 2020-11-15 ENCOUNTER — Emergency Department (HOSPITAL_COMMUNITY)
Admission: EM | Admit: 2020-11-15 | Discharge: 2020-11-15 | Disposition: A | Discharge status: Home / Self Care | Payer: Medicare Other | Attending: Emergency Medicine | Admitting: Emergency Medicine

## 2020-11-15 DIAGNOSIS — Z20822 Contact with and (suspected) exposure to covid-19: Secondary | ICD-10-CM | POA: Diagnosis not present

## 2020-11-15 DIAGNOSIS — S3991XA Unspecified injury of abdomen, initial encounter: Secondary | ICD-10-CM | POA: Insufficient documentation

## 2020-11-15 DIAGNOSIS — I499 Cardiac arrhythmia, unspecified: Secondary | ICD-10-CM | POA: Diagnosis not present

## 2020-11-15 DIAGNOSIS — R4182 Altered mental status, unspecified: Secondary | ICD-10-CM | POA: Diagnosis not present

## 2020-11-15 DIAGNOSIS — S299XXA Unspecified injury of thorax, initial encounter: Secondary | ICD-10-CM | POA: Insufficient documentation

## 2020-11-15 DIAGNOSIS — R404 Transient alteration of awareness: Secondary | ICD-10-CM | POA: Diagnosis not present

## 2020-11-15 DIAGNOSIS — Z041 Encounter for examination and observation following transport accident: Secondary | ICD-10-CM | POA: Diagnosis not present

## 2020-11-15 DIAGNOSIS — R519 Headache, unspecified: Secondary | ICD-10-CM | POA: Diagnosis not present

## 2020-11-15 DIAGNOSIS — M5020 Other cervical disc displacement, unspecified cervical region: Secondary | ICD-10-CM | POA: Diagnosis not present

## 2020-11-15 DIAGNOSIS — S0990XA Unspecified injury of head, initial encounter: Secondary | ICD-10-CM | POA: Insufficient documentation

## 2020-11-15 DIAGNOSIS — M25531 Pain in right wrist: Secondary | ICD-10-CM

## 2020-11-15 DIAGNOSIS — Y900 Blood alcohol level of less than 20 mg/100 ml: Secondary | ICD-10-CM | POA: Diagnosis not present

## 2020-11-15 DIAGNOSIS — R410 Disorientation, unspecified: Secondary | ICD-10-CM | POA: Diagnosis not present

## 2020-11-15 DIAGNOSIS — R4781 Slurred speech: Secondary | ICD-10-CM | POA: Insufficient documentation

## 2020-11-15 DIAGNOSIS — Z743 Need for continuous supervision: Secondary | ICD-10-CM | POA: Diagnosis not present

## 2020-11-15 DIAGNOSIS — R6889 Other general symptoms and signs: Secondary | ICD-10-CM | POA: Diagnosis not present

## 2020-11-15 DIAGNOSIS — E119 Type 2 diabetes mellitus without complications: Secondary | ICD-10-CM | POA: Diagnosis not present

## 2020-11-15 DIAGNOSIS — Y92410 Unspecified street and highway as the place of occurrence of the external cause: Secondary | ICD-10-CM | POA: Diagnosis not present

## 2020-11-15 DIAGNOSIS — K760 Fatty (change of) liver, not elsewhere classified: Secondary | ICD-10-CM | POA: Diagnosis not present

## 2020-11-15 DIAGNOSIS — T1490XA Injury, unspecified, initial encounter: Secondary | ICD-10-CM

## 2020-11-15 LAB — COMPREHENSIVE METABOLIC PANEL
ALT: 7 U/L (ref 0–44)
AST: 13 U/L — ABNORMAL LOW (ref 15–41)
Albumin: 3.2 g/dL — ABNORMAL LOW (ref 3.5–5.0)
Alkaline Phosphatase: 52 U/L (ref 38–126)
Anion gap: 8 (ref 5–15)
BUN: 13 mg/dL (ref 6–20)
CO2: 25 mmol/L (ref 22–32)
Calcium: 8.5 mg/dL — ABNORMAL LOW (ref 8.9–10.3)
Chloride: 106 mmol/L (ref 98–111)
Creatinine, Ser: 0.65 mg/dL (ref 0.44–1.00)
GFR, Estimated: >60 mL/min (ref >60)
Glucose, Bld: 94 mg/dL (ref 70–99)
Potassium: 4 mmol/L (ref 3.5–5.1)
Sodium: 139 mmol/L (ref 135–145)
Total Bilirubin: 0.7 mg/dL (ref 0.3–1.2)
Total Protein: 6.1 g/dL — ABNORMAL LOW (ref 6.5–8.1)

## 2020-11-15 LAB — I-STAT VENOUS BLOOD GAS, ED
Acid-Base Excess: 3 mmol/L — ABNORMAL HIGH (ref 0.0–2.0)
Bicarbonate: 29.4 mmol/L — ABNORMAL HIGH (ref 20.0–28.0)
Calcium, Ion: 1.16 mmol/L (ref 1.15–1.40)
HCT: 31 % — ABNORMAL LOW (ref 36.0–46.0)
Hemoglobin: 10.5 g/dL — ABNORMAL LOW (ref 12.0–15.0)
O2 Saturation: 72 %
Potassium: 3.7 mmol/L (ref 3.5–5.1)
Sodium: 139 mmol/L (ref 135–145)
TCO2: 31 mmol/L (ref 22–32)
pCO2, Ven: 50.6 mmHg (ref 44.0–60.0)
pH, Ven: 7.372 (ref 7.250–7.430)
pO2, Ven: 40 mmHg (ref 32.0–45.0)

## 2020-11-15 LAB — I-STAT CHEM 8, ED
BUN: 15 mg/dL (ref 6–20)
Calcium, Ion: 1.01 mmol/L — ABNORMAL LOW (ref 1.15–1.40)
Chloride: 105 mmol/L (ref 98–111)
Creatinine, Ser: 0.6 mg/dL (ref 0.44–1.00)
Glucose, Bld: 84 mg/dL (ref 70–99)
HCT: 37 % (ref 36.0–46.0)
Hemoglobin: 12.6 g/dL (ref 12.0–15.0)
Potassium: 4.4 mmol/L (ref 3.5–5.1)
Sodium: 138 mmol/L (ref 135–145)
TCO2: 25 mmol/L (ref 22–32)

## 2020-11-15 LAB — RESP PANEL BY RT-PCR (FLU A&B, COVID) ARPGX2
Influenza A by PCR: NEGATIVE
Influenza B by PCR: NEGATIVE
SARS Coronavirus 2 by RT PCR: NEGATIVE

## 2020-11-15 LAB — CBC
HCT: 37 % (ref 36.0–46.0)
Hemoglobin: 11.2 g/dL — ABNORMAL LOW (ref 12.0–15.0)
MCH: 27 pg (ref 26.0–34.0)
MCHC: 30.3 g/dL (ref 30.0–36.0)
MCV: 89.2 fL (ref 80.0–100.0)
Platelets: 232 10*3/uL (ref 150–400)
RBC: 4.15 MIL/uL (ref 3.87–5.11)
RDW: 13.3 % (ref 11.5–15.5)
WBC: 3.3 10*3/uL — ABNORMAL LOW (ref 4.0–10.5)
nRBC: 0 % (ref 0.0–0.2)

## 2020-11-15 LAB — I-STAT BETA HCG BLOOD, ED (MC, WL, AP ONLY): I-stat hCG, quantitative: <5 m[IU]/mL (ref <5)

## 2020-11-15 LAB — PROTIME-INR
INR: 1.1 (ref 0.8–1.2)
Prothrombin Time: 14.2 seconds (ref 11.4–15.2)

## 2020-11-15 LAB — SAMPLE TO BLOOD BANK

## 2020-11-15 LAB — TSH: TSH: 2.864 u[IU]/mL (ref 0.350–4.500)

## 2020-11-15 LAB — T4, FREE: Free T4: 1.24 ng/dL — ABNORMAL HIGH (ref 0.61–1.12)

## 2020-11-15 LAB — LACTIC ACID, PLASMA: Lactic Acid, Venous: 0.8 mmol/L (ref 0.5–1.9)

## 2020-11-15 LAB — ETHANOL: Alcohol, Ethyl (B): <10 mg/dL (ref <10)

## 2020-11-15 MED ORDER — IOHEXOL 350 MG/ML SOLN
100.0000 mL | Freq: Once | INTRAVENOUS | Status: AC | PRN
  Administered 2020-11-15: 100 mL via INTRAVENOUS

## 2020-11-15 MED ORDER — HALOPERIDOL LACTATE 5 MG/ML IJ SOLN
INTRAMUSCULAR | Status: AC
  Administered 2020-11-15: 5 mg via INTRAVENOUS
  Filled 2020-11-15: qty 1

## 2020-11-15 NOTE — Progress Notes (Signed)
Orthopedic Tech Progress Note Patient Details:  Jade Mathis 1975/10/05 [id number]  Level 2 trauma that was upgraded to a level 1   Patient ID: Jade Mathis, female   DOB: Aug 29, 1975, 45 y.o.   MRN: [id number]  Jade Mathis 11/15/2020, 10:34 AM
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
## 2020-11-15 NOTE — ED Notes (Signed)
Patient transported to CT 
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
## 2020-11-16 ENCOUNTER — Encounter: Payer: Self-pay | Admitting: Internal Medicine

## 2020-11-17 DIAGNOSIS — Z79899 Other long term (current) drug therapy: Secondary | ICD-10-CM | POA: Diagnosis not present

## 2020-11-17 DIAGNOSIS — Z79891 Long term (current) use of opiate analgesic: Secondary | ICD-10-CM | POA: Diagnosis not present

## 2020-11-17 DIAGNOSIS — E119 Type 2 diabetes mellitus without complications: Secondary | ICD-10-CM | POA: Diagnosis not present

## 2020-11-17 DIAGNOSIS — Z1159 Encounter for screening for other viral diseases: Secondary | ICD-10-CM | POA: Diagnosis not present

## 2020-11-18 DIAGNOSIS — K589 Irritable bowel syndrome without diarrhea: Secondary | ICD-10-CM | POA: Diagnosis not present

## 2020-11-18 DIAGNOSIS — E119 Type 2 diabetes mellitus without complications: Secondary | ICD-10-CM | POA: Diagnosis not present

## 2020-11-18 DIAGNOSIS — K219 Gastro-esophageal reflux disease without esophagitis: Secondary | ICD-10-CM | POA: Diagnosis not present

## 2020-11-19 DIAGNOSIS — M545 Low back pain, unspecified: Secondary | ICD-10-CM | POA: Diagnosis not present

## 2020-11-19 DIAGNOSIS — G8929 Other chronic pain: Secondary | ICD-10-CM | POA: Diagnosis not present

## 2020-11-19 DIAGNOSIS — Z79891 Long term (current) use of opiate analgesic: Secondary | ICD-10-CM | POA: Diagnosis not present

## 2020-11-19 DIAGNOSIS — Z79899 Other long term (current) drug therapy: Secondary | ICD-10-CM | POA: Diagnosis not present
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
## 2020-11-23 ENCOUNTER — Encounter: Payer: Self-pay | Admitting: Internal Medicine

## 2020-11-23 ENCOUNTER — Other Ambulatory Visit: Payer: Self-pay

## 2020-11-23 ENCOUNTER — Ambulatory Visit (INDEPENDENT_AMBULATORY_CARE_PROVIDER_SITE_OTHER): Payer: Medicare Other | Admitting: Internal Medicine

## 2020-11-23 VITALS — BP 118/80 | HR 89 | Temp 99.0°F | Resp 16 | Ht 69.0 in | Wt 149.0 lb

## 2020-11-23 DIAGNOSIS — R519 Headache, unspecified: Secondary | ICD-10-CM | POA: Diagnosis not present

## 2020-11-23 DIAGNOSIS — R350 Frequency of micturition: Secondary | ICD-10-CM

## 2020-11-23 DIAGNOSIS — G8929 Other chronic pain: Secondary | ICD-10-CM

## 2020-11-23 DIAGNOSIS — S060X0A Concussion without loss of consciousness, initial encounter: Secondary | ICD-10-CM | POA: Diagnosis not present

## 2020-11-23 NOTE — Patient Instructions (Addendum)
  Blood work was ordered.     Medications changes include :  none    A referral was ordered for Dr Tamala Julian.    Follow up with Dr Sharlet Salina next week

## 2020-11-25 ENCOUNTER — Ambulatory Visit: Payer: Medicare Other | Admitting: Family Medicine
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
## 2020-11-27 ENCOUNTER — Encounter: Payer: Self-pay | Admitting: Internal Medicine

## 2020-11-29 ENCOUNTER — Ambulatory Visit (INDEPENDENT_AMBULATORY_CARE_PROVIDER_SITE_OTHER): Payer: Medicare Other | Admitting: Internal Medicine

## 2020-11-29 ENCOUNTER — Other Ambulatory Visit: Payer: Self-pay

## 2020-11-29 ENCOUNTER — Encounter: Payer: Self-pay | Admitting: Internal Medicine

## 2020-11-29 DIAGNOSIS — J4530 Mild persistent asthma, uncomplicated: Secondary | ICD-10-CM | POA: Diagnosis not present

## 2020-11-29 DIAGNOSIS — E669 Obesity, unspecified: Secondary | ICD-10-CM

## 2020-11-29 DIAGNOSIS — S0990XA Unspecified injury of head, initial encounter: Secondary | ICD-10-CM | POA: Diagnosis not present

## 2020-11-29 DIAGNOSIS — G7000 Myasthenia gravis without (acute) exacerbation: Secondary | ICD-10-CM

## 2020-11-29 DIAGNOSIS — F1120 Opioid dependence, uncomplicated: Secondary | ICD-10-CM

## 2020-11-29 DIAGNOSIS — R112 Nausea with vomiting, unspecified: Secondary | ICD-10-CM | POA: Diagnosis not present

## 2020-11-29 DIAGNOSIS — E1169 Type 2 diabetes mellitus with other specified complication: Secondary | ICD-10-CM

## 2020-11-29 MED ORDER — ONDANSETRON 4 MG PO TBDP: 4.0000 mg | ORAL_TABLET | Freq: Three times a day (TID) | ORAL | 0 refills | Status: DC | PRN

## 2020-11-29 MED ORDER — PROMETHAZINE HCL 12.5 MG PO TABS: 12.5000 mg | ORAL_TABLET | Freq: Three times a day (TID) | ORAL | 0 refills | Status: AC | PRN
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
## 2020-11-29 NOTE — Patient Instructions (Signed)
We will get the [facility] forms filled out this week sometime and then let you pick up the form. You will need to take the eye page to your eye doctor to fill out and the neurology page to your myasthenia doctor to fill out.  We will get the letter done in [place] for school

## 2020-11-30 DIAGNOSIS — F1120 Opioid dependence, uncomplicated: Secondary | ICD-10-CM | POA: Insufficient documentation

## 2020-11-30 NOTE — Assessment & Plan Note (Signed)
No flare today. She is using albuterol prn and is on prednisone chronically for her MG.

## 2020-11-30 NOTE — Assessment & Plan Note (Signed)
She does deny any low sugars in the recent past and is still seeing endocrinology for management. Due to her gastric bypass and insulin requirement she does need access to food and medications. Letter will be done for school today.

## 2020-11-30 NOTE — Assessment & Plan Note (Signed)
She does not appear to be in crisis. I have asked her to take the neurology portion of the DMV form to her neurologist as I am unsure to her ability to drive wih her MG and would like their assessment of her neurological ability to drive.

## 2020-11-30 NOTE — Assessment & Plan Note (Signed)
She is requesting promethazine with codeine during this visit for nausea. She admits to having promethazine at home which she states is not helping. Given her oxycodone concurrent (gets #120 per month from pain management) I will not prescribe any codeine containing substance and explained that this would make her at risk for accidental overdose. I have prescribed zofran and promethazine for nausea.
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
## 2020-12-11 DIAGNOSIS — Z79891 Long term (current) use of opiate analgesic: Secondary | ICD-10-CM | POA: Diagnosis not present

## 2020-12-11 DIAGNOSIS — Z6824 Body mass index (BMI) 24.0-24.9, adult: Secondary | ICD-10-CM | POA: Diagnosis not present

## 2020-12-11 DIAGNOSIS — E119 Type 2 diabetes mellitus without complications: Secondary | ICD-10-CM | POA: Diagnosis not present

## 2020-12-11 DIAGNOSIS — Z79899 Other long term (current) drug therapy: Secondary | ICD-10-CM | POA: Diagnosis not present

## 2020-12-12 ENCOUNTER — Other Ambulatory Visit: Payer: Self-pay | Admitting: Neurology

## 2020-12-14 ENCOUNTER — Other Ambulatory Visit: Payer: Self-pay | Admitting: Neurology

## 2020-12-14 ENCOUNTER — Encounter: Payer: Self-pay | Admitting: Internal Medicine

## 2020-12-15 ENCOUNTER — Telehealth: Payer: Self-pay

## 2020-12-15 NOTE — Telephone Encounter (Signed)
Pt picked up her paperwork today.

## 2020-12-15 NOTE — Telephone Encounter (Signed)
Opened in error

## 2020-12-18 DIAGNOSIS — Z79899 Other long term (current) drug therapy: Secondary | ICD-10-CM | POA: Diagnosis not present

## 2020-12-18 DIAGNOSIS — M545 Low back pain, unspecified: Secondary | ICD-10-CM | POA: Diagnosis not present

## 2020-12-18 DIAGNOSIS — G43111 Migraine with aura, intractable, with status migrainosus: Secondary | ICD-10-CM | POA: Diagnosis not present

## 2020-12-18 DIAGNOSIS — G8929 Other chronic pain: Secondary | ICD-10-CM | POA: Diagnosis not present

## 2020-12-18 DIAGNOSIS — R110 Nausea: Secondary | ICD-10-CM | POA: Diagnosis not present

## 2020-12-21 DIAGNOSIS — Z79899 Other long term (current) drug therapy: Secondary | ICD-10-CM | POA: Diagnosis not present

## 2020-12-22 ENCOUNTER — Other Ambulatory Visit: Payer: Self-pay

## 2020-12-22 ENCOUNTER — Ambulatory Visit (INDEPENDENT_AMBULATORY_CARE_PROVIDER_SITE_OTHER): Payer: Medicare Other | Admitting: Internal Medicine

## 2020-12-22 ENCOUNTER — Encounter: Payer: Self-pay | Admitting: Internal Medicine

## 2020-12-22 DIAGNOSIS — R112 Nausea with vomiting, unspecified: Secondary | ICD-10-CM | POA: Diagnosis not present

## 2020-12-22 DIAGNOSIS — F331 Major depressive disorder, recurrent, moderate: Secondary | ICD-10-CM | POA: Diagnosis not present

## 2020-12-22 NOTE — Patient Instructions (Addendum)
We have given you the updated letter today for the breaks. I would recommend to ask your psychiatrist about a letter confirming your learning ability/disability since we have not done any evaluation or testing about this.

## 2020-12-22 NOTE — Progress Notes (Signed)
   Subjective:   Patient ID: Jade Mathis, female    DOB: 02/09/1976, 45 y.o.   MRN: [id number]  HPI The patient is a 45 YO female coming in for follow up.   Review of Systems  Constitutional:  Positive for activity change, appetite change and fatigue.  HENT: Negative.    Eyes: Negative.   Respiratory:  Positive for shortness of breath. Negative for cough and chest tightness.   Cardiovascular:  Negative for chest pain, palpitations and leg swelling.  Gastrointestinal:  Positive for abdominal pain and nausea. Negative for abdominal distention, constipation, diarrhea and vomiting.  Musculoskeletal:  Positive for arthralgias and myalgias.  Skin: Negative.   Neurological:  Positive for weakness.  Psychiatric/Behavioral:  Positive for decreased concentration and dysphoric mood. The patient is nervous/anxious.    Objective:  Physical Exam Constitutional:      Appearance: She is well-developed.  HENT:     Head: Normocephalic and atraumatic.  Cardiovascular:     Rate and Rhythm: Normal rate and regular rhythm.  Pulmonary:     Effort: Pulmonary effort is normal. No respiratory distress.     Breath sounds: Normal breath sounds. No wheezing or rales.  Abdominal:     General: Bowel sounds are normal. There is no distension.     Palpations: Abdomen is soft.     Tenderness: There is abdominal tenderness. There is no rebound.  Musculoskeletal:     Cervical back: Normal range of motion.  Skin:    General: Skin is warm and dry.  Neurological:     Mental Status: She is alert and oriented to person, place, and time. Mental status is at baseline.     Coordination: Coordination normal.    Vitals:   12/22/20 0831  BP: 124/80  Pulse: 92  Resp: 18  Temp: 98.5 F (36.9 C)  TempSrc: Oral  SpO2: 98%  Weight: 152 lb (68.9 kg)  Height: 5\' 9"  (1.753 m)    This visit occurred during the SARS-CoV-2 public health emergency.  Safety protocols were in place, including screening questions  prior to the visit, additional usage of staff PPE, and extensive cleaning of exam room while observing appropriate contact time as indicated for disinfecting solutions.   Assessment & Plan:
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
## 2020-12-24 NOTE — Assessment & Plan Note (Signed)
She is seeing psych for her medications and given that I have never assessed her learning disability I cannot provide details about this. Asked her to see if her psychiatrist can provide this information to her school or if they are unable if they can assess her for this and then provide information.

## 2021-01-10 DIAGNOSIS — E119 Type 2 diabetes mellitus without complications: Secondary | ICD-10-CM | POA: Diagnosis not present

## 2021-01-10 DIAGNOSIS — Z79891 Long term (current) use of opiate analgesic: Secondary | ICD-10-CM | POA: Diagnosis not present

## 2021-01-11 DIAGNOSIS — E119 Type 2 diabetes mellitus without complications: Secondary | ICD-10-CM | POA: Diagnosis not present

## 2021-01-15 DIAGNOSIS — Z79891 Long term (current) use of opiate analgesic: Secondary | ICD-10-CM | POA: Diagnosis not present

## 2021-01-15 DIAGNOSIS — Z79899 Other long term (current) drug therapy: Secondary | ICD-10-CM | POA: Diagnosis not present

## 2021-01-15 DIAGNOSIS — M545 Low back pain, unspecified: Secondary | ICD-10-CM | POA: Diagnosis not present

## 2021-01-15 DIAGNOSIS — G8929 Other chronic pain: Secondary | ICD-10-CM | POA: Diagnosis not present

## 2021-01-18 DIAGNOSIS — K219 Gastro-esophageal reflux disease without esophagitis: Secondary | ICD-10-CM | POA: Diagnosis not present

## 2021-01-18 DIAGNOSIS — K589 Irritable bowel syndrome without diarrhea: Secondary | ICD-10-CM | POA: Diagnosis not present

## 2021-01-18 DIAGNOSIS — Z79899 Other long term (current) drug therapy: Secondary | ICD-10-CM | POA: Diagnosis not present

## 2021-01-18 DIAGNOSIS — E119 Type 2 diabetes mellitus without complications: Secondary | ICD-10-CM | POA: Diagnosis not present

## 2021-02-06 DIAGNOSIS — Z79891 Long term (current) use of opiate analgesic: Secondary | ICD-10-CM | POA: Diagnosis not present

## 2021-02-06 DIAGNOSIS — Z0000 Encounter for general adult medical examination without abnormal findings: Secondary | ICD-10-CM | POA: Diagnosis not present

## 2021-02-06 DIAGNOSIS — E119 Type 2 diabetes mellitus without complications: Secondary | ICD-10-CM | POA: Diagnosis not present

## 2021-02-08 DIAGNOSIS — G8929 Other chronic pain: Secondary | ICD-10-CM | POA: Diagnosis not present

## 2021-02-08 DIAGNOSIS — R110 Nausea: Secondary | ICD-10-CM | POA: Diagnosis not present

## 2021-02-08 DIAGNOSIS — Z79899 Other long term (current) drug therapy: Secondary | ICD-10-CM | POA: Diagnosis not present

## 2021-02-08 DIAGNOSIS — M545 Low back pain, unspecified: Secondary | ICD-10-CM | POA: Diagnosis not present

## 2021-02-08 DIAGNOSIS — R030 Elevated blood-pressure reading, without diagnosis of hypertension: Secondary | ICD-10-CM | POA: Diagnosis not present

## 2021-03-07 ENCOUNTER — Other Ambulatory Visit: Payer: Self-pay | Admitting: Neurology

## 2021-04-01 ENCOUNTER — Encounter: Payer: Self-pay | Admitting: Internal Medicine

## 2021-04-01 ENCOUNTER — Ambulatory Visit: Payer: Medicare Other | Admitting: Internal Medicine

## 2021-04-09 DIAGNOSIS — Z79899 Other long term (current) drug therapy: Secondary | ICD-10-CM | POA: Diagnosis not present

## 2021-04-09 DIAGNOSIS — R300 Dysuria: Secondary | ICD-10-CM | POA: Diagnosis not present

## 2021-04-09 DIAGNOSIS — B3731 Acute candidiasis of vulva and vagina: Secondary | ICD-10-CM | POA: Diagnosis not present

## 2021-04-09 DIAGNOSIS — M545 Low back pain, unspecified: Secondary | ICD-10-CM | POA: Diagnosis not present

## 2021-04-09 DIAGNOSIS — G8929 Other chronic pain: Secondary | ICD-10-CM | POA: Diagnosis not present

## 2021-04-09 DIAGNOSIS — R030 Elevated blood-pressure reading, without diagnosis of hypertension: Secondary | ICD-10-CM | POA: Diagnosis not present

## 2021-04-14 DIAGNOSIS — G8929 Other chronic pain: Secondary | ICD-10-CM | POA: Diagnosis not present

## 2021-04-14 DIAGNOSIS — J45909 Unspecified asthma, uncomplicated: Secondary | ICD-10-CM | POA: Diagnosis not present

## 2021-04-14 DIAGNOSIS — G7000 Myasthenia gravis without (acute) exacerbation: Secondary | ICD-10-CM | POA: Diagnosis not present

## 2021-04-14 DIAGNOSIS — E079 Disorder of thyroid, unspecified: Secondary | ICD-10-CM | POA: Diagnosis not present

## 2021-04-14 DIAGNOSIS — R300 Dysuria: Secondary | ICD-10-CM | POA: Diagnosis not present

## 2021-04-14 DIAGNOSIS — F32A Depression, unspecified: Secondary | ICD-10-CM | POA: Diagnosis not present

## 2021-04-14 NOTE — Progress Notes (Signed)
Follow-up Visit   Date: 04/15/21   Jade Mathis MRN: [id number] DOB: Aug 22, 1975   Interim History: Jade Mathis is a 46 y.o. 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  The patient was accompanied to the clinic by self.  Myasthenia is well-controlled and her current regimen on prednisone 34m, azathioprine 1549m and mestinon 6031mour times daily. She does not report new bulbar or limb weakness.  She has lost ~40lb over the past 2 years.  She is asking my knowledge on potential drug-drug interaction of hydroxyzine (prescribed by pain provider) and bactrim because she read it causes heart attack.    Medications:  Current Outpatient Medications on File Prior to Visit  Medication Sig Dispense Refill   ACCU-CHEK AVIVA PLUS test strip Use as instructed 2x daily 100 each 11   ACCU-CHEK SOFTCLIX LANCETS lancets 1 each by Other route 4 (four) times daily. Use as instructed (Patient taking differently: 1 each by Other route 2 (two) times daily. Use as instructed) 100 each 7   albuterol (PROVENTIL) (2.5 MG/3ML) 0.083% nebulizer solution Take 3 mLs (2.5 mg total) by nebulization every 4 (four) hours as needed for wheezing or shortness of breath. 75 mL 2   albuterol (VENTOLIN HFA) 108 (90 Base) MCG/ACT inhaler Inhale 2 puffs into the lungs every 6 (six) hours as needed for wheezing or shortness of breath. 18 g 1   ALPRAZolam (XANAX) 1 MG tablet Take 1 mg by mouth 3 (three) times daily.     aspirin-acetaminophen-caffeine (EXCEDRIN MIGRAINE) 250-250-65 MG tablet Take 1 tablet by mouth every 6 (six) hours as needed for headache or migraine.     azaTHIOprine (IMURAN) 50 MG tablet TAKE 3 TABLETS (150 MG TOTAL) BY MOUTH IN THE MORNING. 270 tablet 0   b complex vitamins tablet  Take 1 tablet by mouth daily.     Blood Glucose Monitoring Suppl (ACCU-CHEK AVIVA PLUS) w/Device KIT Use to test blood sugar daily 1 kit 1   cetirizine (ZYRTEC) 10 MG tablet TAKE 1 TABLET BY MOUTH EVERY DAY 90 tablet 1   cyclobenzaprine (FLEXERIL) 10 MG tablet Take 10 mg by mouth 3 (three) times daily as needed (for headaches).     DULoxetine (CYMBALTA) 60 MG capsule Take 60 mg by mouth daily.  5   empagliflozin (JARDIANCE) 25 MG TABS tablet Take 12.5 mg by mouth daily.     Erenumab-aooe 140 MG/ML SOAJ Inject 140 mg into the skin every 30 (thirty) days.     ergocalciferol (VITAMIN D2) 1.25 MG (50000 UT) capsule Take 50,000 Units by mouth every Sunday.     estradiol (CLIMARA - DOSED IN MG/24 HR) 0.025 mg/24hr patch Place onto the skin.     Fe Cbn-Fe Gluc-FA-B12-C-DSS (FERRALET 90) 90-1 MG TABS TAKE 1 TABLET BY MOUTH EVERY DAY (Patient taking differently: Take 1 tablet by mouth daily.) 90 tablet 1   feeding supplement, GLUCERNA SHAKE, (GLUCERNA SHAKE) LIQD Take 237 mLs by mouth 3 (three) times daily between meals. 90 Can 5   fluticasone (FLONASE) 50 MCG/ACT nasal spray Place 1 spray into both nostrils daily. (Patient taking differently: Place 1-2 sprays into both nostrils daily as needed for allergies or rhinitis.) 16 g 2   Fluticasone-Salmeterol (ADVAIR) 100-50 MCG/DOSE AEPB Inhale 1 puff into the lungs 2 (two) times daily. 1 each 2   gabapentin (NEURONTIN) 400 MG  capsule Take 400 mg by mouth 3 (three) times daily.  5   ibuprofen (ADVIL) 800 MG tablet Take 800 mg by mouth every 8 (eight) hours as needed for mild pain or headache.     Insulin Pen Needle 31G X 5 MM MISC Use daily with insulin pen 50 each 1   levothyroxine (SYNTHROID) 137 MCG tablet Take 1 tablet (137 mcg total) by mouth daily at 6 (six) AM. 30 tablet 1   linaclotide (LINZESS) 290 MCG CAPS capsule Take 290 mcg by mouth daily before breakfast.     Multiple Vitamins-Minerals (CENTRUM WOMEN) TABS Take 1 tablet by mouth daily with  breakfast.     naproxen (EC NAPROSYN) 500 MG EC tablet Take 500 mg by mouth 2 (two) times daily.     omega-3 acid ethyl esters (LOVAZA) 1 g capsule TAKE 2 CAPSULES (2 G TOTAL) BY MOUTH 2 (TWO) TIMES DAILY. 360 capsule 1   ondansetron (ZOFRAN-ODT) 4 MG disintegrating tablet Take 1 tablet (4 mg total) by mouth every 8 (eight) hours as needed for nausea or vomiting. 15 tablet 0   oxyCODONE (ROXICODONE) 15 MG immediate release tablet Take 15 mg by mouth 4 (four) times daily.  0   pantoprazole (PROTONIX) 40 MG tablet TAKE 1 TABLET BY MOUTH EVERY DAY (Patient taking differently: Take 40 mg by mouth daily before breakfast.) 90 tablet 1   phentermine (ADIPEX-P) 37.5 MG tablet Take 37.5 mg by mouth daily.     prazosin (MINIPRESS) 1 MG capsule Take 1 mg by mouth daily.     predniSONE (DELTASONE) 10 MG tablet TAKE 1 TABLET BY MOUTH EVERY DAY 90 tablet 0   prochlorperazine (COMPAZINE) 10 MG tablet Take 1 tablet (10 mg total) by mouth 2 (two) times daily as needed for nausea. 20 tablet 0   promethazine (PHENERGAN) 12.5 MG tablet Take 1 tablet (12.5 mg total) by mouth every 8 (eight) hours as needed for nausea or vomiting. 20 tablet 0   pyridostigmine (MESTINON) 60 MG tablet TAKE 1 TABLET (60 MG TOTAL) BY MOUTH 4 TIMES DAILY. (Patient taking differently: Take 60 mg by mouth 4 (four) times daily.) 360 tablet 3   QUEtiapine (SEROQUEL) 200 MG tablet Take 200 mg by mouth at bedtime.     Semaglutide (OZEMPIC, 1 MG/DOSE, [place]) Inject 1 mg into the skin every Sunday.     SUMAtriptan (IMITREX) 100 MG tablet Take 100 mg by mouth as needed for migraine (and may repeat in 2 hours if headache persists or recurs).     SYNTHROID 150 MCG tablet Take 150 mcg by mouth daily.     topiramate (TOPAMAX) 200 MG tablet Take 200 mg by mouth daily in the afternoon.     traZODone (DESYREL) 100 MG tablet Take 1 tablet (100 mg total) by mouth at bedtime. 90 tablet 3   triamcinolone (KENALOG) 0.1 % Apply 1 application topically 2 (two) times  daily. (Patient taking differently: Apply 1 application topically 2 (two) times daily as needed (for irritation).) 100 g 3   valACYclovir (VALTREX) 500 MG tablet Take 1 tablet (500 mg total) by mouth 2 (two) times daily. (Patient taking differently: Take 500 mg by mouth 2 (two) times daily as needed (as directed for breakouts).) 180 tablet 0   hydrOXYzine (ATARAX/VISTARIL) 10 MG tablet Take 10 mg by mouth 3 (three) times daily as needed for itching. (Patient not taking: Reported on 04/15/2021)     No current facility-administered medications on file prior to visit.    Allergies:  Allergies  Allergen Reactions   Depo-Provera [Medroxyprogesterone] Other (See Comments)    Headaches    Other Itching and Other (See Comments)    Patient requires 1-2 Hydroxyzine when pain meds are taken   Vicodin [Hydrocodone-Acetaminophen] Nausea Only    Vital Signs:  BP 113/73    Pulse (!) 107    Ht 5' 9" (1.753 m)    Wt 156 lb (70.8 kg)    SpO2 100%    BMI 23.04 kg/m     Neurological Exam: MENTAL STATUS including orientation to time, place, person, recent and remote memory, attention span and concentration, language, and fund of knowledge is normal.  Speech is not dysarthric.  CRANIAL NERVES:  Pupils equal round and reactive to light.  Normal conjugate, extra-ocular eye movements in all directions of gaze.  No ptosis.  Face is symmetric.   MOTOR:  Motor strength is 5/5 in all extremities, minimal give-way today.  No atrophy, fasciculations or abnormal movements.  No pronator drift.  Tone is normal.    COORDINATION/GAIT:   Gait assisted with cane, stable.    Data: Lab Results  Component Value Date   WBC 3.3 (L) 11/15/2020   HGB 10.5 (L) 11/15/2020   HCT 31.0 (L) 11/15/2020   MCV 89.2 11/15/2020   PLT 232 11/15/2020   Lab Results  Component Value Date   ALT 7 11/15/2020   AST 13 (L) 11/15/2020   ALKPHOS 52 11/15/2020   BILITOT 0.7 11/15/2020     IMPRESSION/PLAN: Seropositive generalized  myasthenia gravis (dx 1997) s/p thymectomy.  Previously hospitalized for MG exacerbation in 1997 (PLEX), 2011 (PLEX), 2016 (IVIG), 2022 (IVIG).   Today, she is doing well.  No signs of weakness on exam.  - Continue prednisone 52m daily - Continue azathioprine 1544md  - Reduce mestinon to 6070mhree times daily.  OK to take an extra dose as needed  - Check CBC and CMP (labs from 10/2020 reviewed and stable)  Informed patient to follow-up with her prescribing provider for questions related to hydroxyzine.  Brief online search does not show heart attack to be a potential drug interaction with bactrim.  Return to clinic in 8 months.    Thank you for allowing me to participate in patient's care.  If I can answer any additional questions, I would be pleased to do so.    Sincerely,    Rowyn Spilde K. PatPosey ProntoO

## 2021-04-15 ENCOUNTER — Ambulatory Visit (INDEPENDENT_AMBULATORY_CARE_PROVIDER_SITE_OTHER): Payer: Medicare Other | Admitting: Neurology

## 2021-04-15 ENCOUNTER — Encounter: Payer: Self-pay | Admitting: Neurology

## 2021-04-15 ENCOUNTER — Other Ambulatory Visit (INDEPENDENT_AMBULATORY_CARE_PROVIDER_SITE_OTHER): Payer: Medicare Other

## 2021-04-15 ENCOUNTER — Other Ambulatory Visit: Payer: Self-pay

## 2021-04-15 VITALS — BP 113/73 | HR 107 | Ht 66.0 in | Wt 156.0 lb

## 2021-04-15 DIAGNOSIS — G7000 Myasthenia gravis without (acute) exacerbation: Secondary | ICD-10-CM | POA: Diagnosis not present

## 2021-04-15 DIAGNOSIS — Z5181 Encounter for therapeutic drug level monitoring: Secondary | ICD-10-CM

## 2021-04-15 LAB — COMPREHENSIVE METABOLIC PANEL
ALT: 8 U/L (ref 0–35)
AST: 13 U/L (ref 0–37)
Albumin: 4.3 g/dL (ref 3.5–5.2)
Alkaline Phosphatase: 75 U/L (ref 39–117)
BUN: 13 mg/dL (ref 6–23)
CO2: 32 mEq/L (ref 19–32)
Calcium: 9.5 mg/dL (ref 8.4–10.5)
Chloride: 98 mEq/L (ref 96–112)
Creatinine, Ser: 0.86 mg/dL (ref 0.40–1.20)
GFR: 81.17 mL/min (ref >60.00)
Glucose, Bld: 103 mg/dL — ABNORMAL HIGH (ref 70–99)
Potassium: 3.7 mEq/L (ref 3.5–5.1)
Sodium: 137 mEq/L (ref 135–145)
Total Bilirubin: 0.5 mg/dL (ref 0.2–1.2)
Total Protein: 7.6 g/dL (ref 6.0–8.3)

## 2021-04-15 LAB — CBC
HCT: 38.9 % (ref 36.0–46.0)
Hemoglobin: 12.5 g/dL (ref 12.0–15.0)
MCHC: 32.2 g/dL (ref 30.0–36.0)
MCV: 82.8 fl (ref 78.0–100.0)
Platelets: 255 10*3/uL (ref 150.0–400.0)
RBC: 4.7 Mil/uL (ref 3.87–5.11)
RDW: 14.5 % (ref 11.5–15.5)
WBC: 4.2 10*3/uL (ref 4.0–10.5)

## 2021-04-15 MED ORDER — PYRIDOSTIGMINE BROMIDE 60 MG PO TABS: ORAL_TABLET | ORAL | 3 refills | Status: DC

## 2021-04-15 MED ORDER — PREDNISONE 10 MG PO TABS: 10.0000 mg | ORAL_TABLET | Freq: Every day | ORAL | 3 refills | Status: DC

## 2021-04-15 MED ORDER — AZATHIOPRINE 50 MG PO TABS: 150.0000 mg | ORAL_TABLET | Freq: Every morning | ORAL | 0 refills | Status: DC

## 2021-04-15 NOTE — Patient Instructions (Signed)
Continue prednisone 10mg , azathrioprine 150mg  daily. Reduce mestinon 60mg  three times daily.  OK to take a extra dose as needed. Check labs  Return clinic 8 months

## 2021-05-04 ENCOUNTER — Encounter: Payer: Self-pay | Admitting: Internal Medicine

## 2021-05-04 ENCOUNTER — Ambulatory Visit (INDEPENDENT_AMBULATORY_CARE_PROVIDER_SITE_OTHER): Payer: Medicare Other | Admitting: Internal Medicine

## 2021-05-04 ENCOUNTER — Other Ambulatory Visit: Payer: Self-pay

## 2021-05-04 DIAGNOSIS — E669 Obesity, unspecified: Secondary | ICD-10-CM | POA: Diagnosis not present

## 2021-05-04 DIAGNOSIS — E1169 Type 2 diabetes mellitus with other specified complication: Secondary | ICD-10-CM

## 2021-05-04 DIAGNOSIS — F4323 Adjustment disorder with mixed anxiety and depressed mood: Secondary | ICD-10-CM | POA: Diagnosis not present

## 2021-05-04 MED ORDER — ONETOUCH DELICA LANCETS 33G MISC: 11 refills | Status: AC

## 2021-05-04 MED ORDER — ONETOUCH VERIO VI STRP: ORAL_STRIP | 12 refills | Status: DC

## 2021-05-04 NOTE — Assessment & Plan Note (Signed)
Needs refill on strips and lancets as her diabetes specialist is out of the office.

## 2021-05-04 NOTE — Patient Instructions (Signed)
We have given you the letter today and sent in the strips and lancets.

## 2021-05-04 NOTE — Progress Notes (Signed)
° °  Subjective:   Patient ID: Jade Mathis, female    DOB: 1975-05-23, 46 y.o.   MRN: [id number]  HPI The patient is a 46 YO female coming in for concerns about school. She is kicked out of the program due to several F grades. There was some conflict with the teacher. She saw her mental health provider and they are adjusting her medications.   Review of Systems  Constitutional:  Positive for activity change, appetite change and fatigue.  HENT: Negative.    Eyes: Negative.   Respiratory:  Negative for cough, chest tightness and shortness of breath.   Cardiovascular:  Negative for chest pain, palpitations and leg swelling.  Gastrointestinal:  Negative for abdominal distention, abdominal pain, constipation, diarrhea, nausea and vomiting.  Musculoskeletal:  Positive for arthralgias and back pain.  Skin: Negative.   Neurological:  Positive for weakness and numbness.  Psychiatric/Behavioral:  Positive for decreased concentration, dysphoric mood and sleep disturbance. The patient is nervous/anxious.    Objective:  Physical Exam Constitutional:      Appearance: She is well-developed.  HENT:     Head: Normocephalic and atraumatic.  Cardiovascular:     Rate and Rhythm: Normal rate and regular rhythm.  Pulmonary:     Effort: Pulmonary effort is normal. No respiratory distress.     Breath sounds: Normal breath sounds. No wheezing or rales.  Abdominal:     General: Bowel sounds are normal. There is no distension.     Palpations: Abdomen is soft.     Tenderness: There is no abdominal tenderness. There is no rebound.  Musculoskeletal:        General: Tenderness present.     Cervical back: Normal range of motion.  Skin:    General: Skin is warm and dry.  Neurological:     Mental Status: She is alert and oriented to person, place, and time.     Coordination: Coordination abnormal.     Comments: Cane    Vitals:   05/04/21 0906  BP: 126/82  Pulse: 64  Resp: 18  SpO2: 100%   Weight: 158 lb 9.6 oz (71.9 kg)  Height: 5\' 6"  (1.676 m)    This visit occurred during the SARS-CoV-2 public health emergency.  Safety protocols were in place, including screening questions prior to the visit, additional usage of staff PPE, and extensive cleaning of exam room while observing appropriate contact time as indicated for disinfecting solutions.   Assessment & Plan:

## 2021-05-04 NOTE — Assessment & Plan Note (Signed)
Seeing mental health and recent adjustment to her seroquel. She requests letter to the college to consider her health conditions in re-evaluation of her place in the program. Letter done today.

## 2021-05-05 ENCOUNTER — Ambulatory Visit (INDEPENDENT_AMBULATORY_CARE_PROVIDER_SITE_OTHER): Payer: Medicare Other

## 2021-05-05 VITALS — BP 110/79 | HR 89 | Wt 164.0 lb

## 2021-05-05 DIAGNOSIS — Z3202 Encounter for pregnancy test, result negative: Secondary | ICD-10-CM

## 2021-05-05 DIAGNOSIS — Z3200 Encounter for pregnancy test, result unknown: Secondary | ICD-10-CM

## 2021-05-05 DIAGNOSIS — E039 Hypothyroidism, unspecified: Secondary | ICD-10-CM | POA: Diagnosis not present

## 2021-05-05 LAB — POCT PREGNANCY, URINE: Preg Test, Ur: NEGATIVE

## 2021-05-05 NOTE — Patient Instructions (Signed)
[facility] [address], [address] [place], [place] [address] [phone] Main [phone] Direct  [facility] [address] [place], [place] [address] [phone]
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
## 2021-05-06 DIAGNOSIS — R1030 Lower abdominal pain, unspecified: Secondary | ICD-10-CM | POA: Diagnosis not present

## 2021-05-07 DIAGNOSIS — G8929 Other chronic pain: Secondary | ICD-10-CM | POA: Diagnosis not present

## 2021-05-07 DIAGNOSIS — K5900 Constipation, unspecified: Secondary | ICD-10-CM | POA: Diagnosis not present

## 2021-05-07 DIAGNOSIS — Z79899 Other long term (current) drug therapy: Secondary | ICD-10-CM | POA: Diagnosis not present

## 2021-05-07 DIAGNOSIS — M545 Low back pain, unspecified: Secondary | ICD-10-CM | POA: Diagnosis not present

## 2021-05-07 DIAGNOSIS — L299 Pruritus, unspecified: Secondary | ICD-10-CM | POA: Diagnosis not present

## 2021-05-10 ENCOUNTER — Emergency Department (HOSPITAL_COMMUNITY)
Admission: EM | Admit: 2021-05-10 | Discharge: 2021-05-10 | Disposition: A | Discharge status: Home / Self Care | Payer: Medicare Other | Attending: Emergency Medicine | Admitting: Emergency Medicine

## 2021-05-10 ENCOUNTER — Other Ambulatory Visit: Payer: Self-pay

## 2021-05-10 ENCOUNTER — Encounter (HOSPITAL_COMMUNITY): Payer: Self-pay | Admitting: *Deleted

## 2021-05-10 DIAGNOSIS — R140 Abdominal distension (gaseous): Secondary | ICD-10-CM | POA: Insufficient documentation

## 2021-05-10 DIAGNOSIS — Z5321 Procedure and treatment not carried out due to patient leaving prior to being seen by health care provider: Secondary | ICD-10-CM | POA: Insufficient documentation

## 2021-05-10 NOTE — ED Notes (Signed)
Pt told staff that she was going to another hospital and left the facility.

## 2021-05-10 NOTE — ED Notes (Signed)
Pt refused vitals 
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
## 2021-05-12 DIAGNOSIS — Z0000 Encounter for general adult medical examination without abnormal findings: Secondary | ICD-10-CM | POA: Diagnosis not present

## 2021-05-12 DIAGNOSIS — R0602 Shortness of breath: Secondary | ICD-10-CM | POA: Diagnosis not present

## 2021-05-12 DIAGNOSIS — E559 Vitamin D deficiency, unspecified: Secondary | ICD-10-CM | POA: Diagnosis not present

## 2021-05-12 DIAGNOSIS — E119 Type 2 diabetes mellitus without complications: Secondary | ICD-10-CM | POA: Diagnosis not present

## 2021-05-12 DIAGNOSIS — Z79891 Long term (current) use of opiate analgesic: Secondary | ICD-10-CM | POA: Diagnosis not present

## 2021-05-12 DIAGNOSIS — E039 Hypothyroidism, unspecified: Secondary | ICD-10-CM | POA: Diagnosis not present

## 2021-05-12 DIAGNOSIS — M129 Arthropathy, unspecified: Secondary | ICD-10-CM | POA: Diagnosis not present

## 2021-05-12 DIAGNOSIS — Z79899 Other long term (current) drug therapy: Secondary | ICD-10-CM | POA: Diagnosis not present

## 2021-05-12 DIAGNOSIS — Z23 Encounter for immunization: Secondary | ICD-10-CM | POA: Diagnosis not present

## 2021-05-12 DIAGNOSIS — E785 Hyperlipidemia, unspecified: Secondary | ICD-10-CM | POA: Diagnosis not present

## 2021-05-12 DIAGNOSIS — D539 Nutritional anemia, unspecified: Secondary | ICD-10-CM | POA: Diagnosis not present

## 2021-05-13 ENCOUNTER — Ambulatory Visit: Payer: Medicare Other

## 2021-05-16 ENCOUNTER — Telehealth: Payer: Self-pay

## 2021-05-16 DIAGNOSIS — K5900 Constipation, unspecified: Secondary | ICD-10-CM | POA: Diagnosis not present

## 2021-05-16 NOTE — Telephone Encounter (Signed)
Pt is requesting a Rx for lift chair, Glucerna shakes x 2 a day, and incontinence briefs M/L.  [facility] sent a fax requesting Rxs.  Pt CB [phone]

## 2021-05-19 NOTE — Telephone Encounter (Signed)
Do we have this fax? ?

## 2021-05-19 NOTE — Telephone Encounter (Signed)
It has not came thru your e-fax folder  ?

## 2021-05-19 NOTE — Telephone Encounter (Signed)
Call patient to have them re-fax then ?

## 2021-05-20 ENCOUNTER — Encounter: Payer: Self-pay | Admitting: Internal Medicine

## 2021-05-20 ENCOUNTER — Emergency Department (HOSPITAL_COMMUNITY)
Admission: EM | Admit: 2021-05-20 | Discharge: 2021-05-20 | Disposition: A | Discharge status: Home / Self Care | Payer: Medicare Other | Attending: Emergency Medicine | Admitting: Emergency Medicine

## 2021-05-20 ENCOUNTER — Emergency Department (HOSPITAL_COMMUNITY): Payer: Medicare Other

## 2021-05-20 ENCOUNTER — Encounter (HOSPITAL_COMMUNITY): Payer: Self-pay

## 2021-05-20 DIAGNOSIS — S8991XA Unspecified injury of right lower leg, initial encounter: Secondary | ICD-10-CM | POA: Insufficient documentation

## 2021-05-20 DIAGNOSIS — S199XXA Unspecified injury of neck, initial encounter: Secondary | ICD-10-CM | POA: Diagnosis not present

## 2021-05-20 DIAGNOSIS — R519 Headache, unspecified: Secondary | ICD-10-CM | POA: Diagnosis not present

## 2021-05-20 DIAGNOSIS — E119 Type 2 diabetes mellitus without complications: Secondary | ICD-10-CM | POA: Diagnosis not present

## 2021-05-20 DIAGNOSIS — M25512 Pain in left shoulder: Secondary | ICD-10-CM | POA: Diagnosis not present

## 2021-05-20 DIAGNOSIS — S6991XA Unspecified injury of right wrist, hand and finger(s), initial encounter: Secondary | ICD-10-CM | POA: Diagnosis not present

## 2021-05-20 DIAGNOSIS — Z794 Long term (current) use of insulin: Secondary | ICD-10-CM | POA: Diagnosis not present

## 2021-05-20 DIAGNOSIS — F419 Anxiety disorder, unspecified: Secondary | ICD-10-CM | POA: Insufficient documentation

## 2021-05-20 DIAGNOSIS — S29001A Unspecified injury of muscle and tendon of front wall of thorax, initial encounter: Secondary | ICD-10-CM | POA: Diagnosis not present

## 2021-05-20 DIAGNOSIS — S4991XA Unspecified injury of right shoulder and upper arm, initial encounter: Secondary | ICD-10-CM | POA: Diagnosis not present

## 2021-05-20 DIAGNOSIS — S6992XA Unspecified injury of left wrist, hand and finger(s), initial encounter: Secondary | ICD-10-CM | POA: Diagnosis not present

## 2021-05-20 DIAGNOSIS — Y92410 Unspecified street and highway as the place of occurrence of the external cause: Secondary | ICD-10-CM | POA: Diagnosis not present

## 2021-05-20 DIAGNOSIS — S0990XA Unspecified injury of head, initial encounter: Secondary | ICD-10-CM | POA: Insufficient documentation

## 2021-05-20 DIAGNOSIS — M25561 Pain in right knee: Secondary | ICD-10-CM | POA: Diagnosis not present

## 2021-05-20 DIAGNOSIS — M19032 Primary osteoarthritis, left wrist: Secondary | ICD-10-CM | POA: Diagnosis not present

## 2021-05-20 DIAGNOSIS — M25511 Pain in right shoulder: Secondary | ICD-10-CM | POA: Diagnosis not present

## 2021-05-20 DIAGNOSIS — S3992XA Unspecified injury of lower back, initial encounter: Secondary | ICD-10-CM | POA: Diagnosis not present

## 2021-05-20 DIAGNOSIS — R079 Chest pain, unspecified: Secondary | ICD-10-CM | POA: Diagnosis not present

## 2021-05-20 DIAGNOSIS — M542 Cervicalgia: Secondary | ICD-10-CM | POA: Diagnosis not present

## 2021-05-20 DIAGNOSIS — Z041 Encounter for examination and observation following transport accident: Secondary | ICD-10-CM | POA: Diagnosis not present

## 2021-05-20 MED ORDER — LORAZEPAM 1 MG PO TABS
1.0000 mg | ORAL_TABLET | Freq: Once | ORAL | Status: AC
  Administered 2021-05-20: 1 mg via ORAL
  Filled 2021-05-20: qty 1

## 2021-05-20 MED ORDER — LORAZEPAM 1 MG PO TABS: 1.0000 mg | ORAL_TABLET | Freq: Four times a day (QID) | ORAL | 0 refills | Status: DC | PRN

## 2021-05-20 MED ORDER — OXYCODONE-ACETAMINOPHEN 5-325 MG PO TABS
1.0000 | ORAL_TABLET | Freq: Once | ORAL | Status: AC
  Administered 2021-05-20: 1 via ORAL
  Filled 2021-05-20: qty 1

## 2021-05-20 MED ORDER — METHOCARBAMOL 500 MG PO TABS: 500.0000 mg | ORAL_TABLET | Freq: Two times a day (BID) | ORAL | 0 refills | Status: DC
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
## 2021-05-20 NOTE — Telephone Encounter (Signed)
I do not have all required information about the glucerna and briefs. My recommendation is to get the form so I can sign as there is a form that needs to be filled out for medicaid without which the rx itself is not helpful. What diagnosis does she need to lift chair for? I do not typically write rx for these for patients.  ?

## 2021-05-20 NOTE — Telephone Encounter (Signed)
Patient calling in ? ?Requesting rx for life chair, glucerna shakes, & incontinence briefs be printed out so she can pick up from office ? ?Patient says she does not want to continue waiting on a fax to come through bc she needs the items as soon as possible ? ?Please let patient know when ready for pick up [phone] ?
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
## 2021-05-21 ENCOUNTER — Ambulatory Visit (HOSPITAL_COMMUNITY)
Admission: EM | Admit: 2021-05-21 | Discharge: 2021-05-21 | Disposition: A | Discharge status: Home / Self Care | Payer: Medicare Other

## 2021-05-21 ENCOUNTER — Emergency Department (HOSPITAL_COMMUNITY)
Admission: EM | Admit: 2021-05-21 | Discharge: 2021-05-21 | Disposition: A | Discharge status: Home / Self Care | Payer: Medicare Other | Attending: Emergency Medicine | Admitting: Emergency Medicine

## 2021-05-21 ENCOUNTER — Encounter (HOSPITAL_COMMUNITY): Payer: Self-pay

## 2021-05-21 ENCOUNTER — Other Ambulatory Visit: Payer: Self-pay

## 2021-05-21 DIAGNOSIS — K5900 Constipation, unspecified: Secondary | ICD-10-CM

## 2021-05-21 DIAGNOSIS — M25511 Pain in right shoulder: Secondary | ICD-10-CM | POA: Diagnosis not present

## 2021-05-21 DIAGNOSIS — S1090XA Unspecified superficial injury of unspecified part of neck, initial encounter: Secondary | ICD-10-CM | POA: Diagnosis present

## 2021-05-21 DIAGNOSIS — Y92410 Unspecified street and highway as the place of occurrence of the external cause: Secondary | ICD-10-CM | POA: Insufficient documentation

## 2021-05-21 DIAGNOSIS — Z794 Long term (current) use of insulin: Secondary | ICD-10-CM | POA: Diagnosis not present

## 2021-05-21 DIAGNOSIS — S1091XA Abrasion of unspecified part of neck, initial encounter: Secondary | ICD-10-CM | POA: Diagnosis not present

## 2021-05-21 DIAGNOSIS — Z7982 Long term (current) use of aspirin: Secondary | ICD-10-CM | POA: Insufficient documentation

## 2021-05-21 LAB — COMPREHENSIVE METABOLIC PANEL
ALT: 10 U/L (ref 0–44)
AST: 15 U/L (ref 15–41)
Albumin: 3.8 g/dL (ref 3.5–5.0)
Alkaline Phosphatase: 67 U/L (ref 38–126)
Anion gap: 8 (ref 5–15)
BUN: 13 mg/dL (ref 6–20)
CO2: 26 mmol/L (ref 22–32)
Calcium: 8.9 mg/dL (ref 8.9–10.3)
Chloride: 99 mmol/L (ref 98–111)
Creatinine, Ser: 0.76 mg/dL (ref 0.44–1.00)
GFR, Estimated: >60 mL/min (ref >60)
Glucose, Bld: 117 mg/dL — ABNORMAL HIGH (ref 70–99)
Potassium: 3.4 mmol/L — ABNORMAL LOW (ref 3.5–5.1)
Sodium: 133 mmol/L — ABNORMAL LOW (ref 135–145)
Total Bilirubin: 0.6 mg/dL (ref 0.3–1.2)
Total Protein: 7.2 g/dL (ref 6.5–8.1)

## 2021-05-21 LAB — CBC WITH DIFFERENTIAL/PLATELET
Abs Immature Granulocytes: 0.04 10*3/uL (ref 0.00–0.07)
Basophils Absolute: 0 10*3/uL (ref 0.0–0.1)
Basophils Relative: 1 %
Eosinophils Absolute: 0.2 10*3/uL (ref 0.0–0.5)
Eosinophils Relative: 3 %
HCT: 40 % (ref 36.0–46.0)
Hemoglobin: 12.6 g/dL (ref 12.0–15.0)
Immature Granulocytes: 1 %
Lymphocytes Relative: 20 %
Lymphs Abs: 1.1 10*3/uL (ref 0.7–4.0)
MCH: 27.3 pg (ref 26.0–34.0)
MCHC: 31.5 g/dL (ref 30.0–36.0)
MCV: 86.8 fL (ref 80.0–100.0)
Monocytes Absolute: 0.4 10*3/uL (ref 0.1–1.0)
Monocytes Relative: 7 %
Neutro Abs: 3.6 10*3/uL (ref 1.7–7.7)
Neutrophils Relative %: 68 %
Platelets: 243 10*3/uL (ref 150–400)
RBC: 4.61 MIL/uL (ref 3.87–5.11)
RDW: 13.4 % (ref 11.5–15.5)
WBC: 5.3 10*3/uL (ref 4.0–10.5)
nRBC: 0 % (ref 0.0–0.2)

## 2021-05-21 LAB — I-STAT BETA HCG BLOOD, ED (MC, WL, AP ONLY): I-stat hCG, quantitative: <5 m[IU]/mL (ref <5)

## 2021-05-21 LAB — URINALYSIS, ROUTINE W REFLEX MICROSCOPIC
Bilirubin Urine: NEGATIVE
Glucose, UA: NEGATIVE mg/dL
Hgb urine dipstick: NEGATIVE
Ketones, ur: NEGATIVE mg/dL
Leukocytes,Ua: NEGATIVE
Nitrite: NEGATIVE
Protein, ur: NEGATIVE mg/dL
Specific Gravity, Urine: 1.028 (ref 1.005–1.030)
pH: 5 (ref 5.0–8.0)

## 2021-05-21 NOTE — ED Triage Notes (Signed)
Mother states her and her daughter have had constipation for the a while.  ?
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
## 2021-05-21 NOTE — ED Triage Notes (Signed)
Present with provider ? ?
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
## 2021-05-23 NOTE — Telephone Encounter (Signed)
Pt is requesting a call from MA to explain why she needs those Rx. Pt states that Dr. Sharlet Salina has signed off on the Rx before. ? ?Pt CB [phone] ?

## 2021-05-23 NOTE — Telephone Encounter (Signed)
See my chart message

## 2021-05-24 ENCOUNTER — Telehealth: Payer: Self-pay | Admitting: Internal Medicine
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
## 2021-05-26 DIAGNOSIS — E1140 Type 2 diabetes mellitus with diabetic neuropathy, unspecified: Secondary | ICD-10-CM | POA: Diagnosis not present

## 2021-05-26 DIAGNOSIS — E039 Hypothyroidism, unspecified: Secondary | ICD-10-CM | POA: Diagnosis not present

## 2021-05-26 DIAGNOSIS — E559 Vitamin D deficiency, unspecified: Secondary | ICD-10-CM | POA: Diagnosis not present

## 2021-05-26 DIAGNOSIS — D508 Other iron deficiency anemias: Secondary | ICD-10-CM | POA: Diagnosis not present

## 2021-05-27 ENCOUNTER — Other Ambulatory Visit: Payer: Self-pay

## 2021-05-27 ENCOUNTER — Encounter: Payer: Self-pay | Admitting: Internal Medicine

## 2021-05-27 ENCOUNTER — Ambulatory Visit (INDEPENDENT_AMBULATORY_CARE_PROVIDER_SITE_OTHER): Payer: Medicare Other | Admitting: Internal Medicine

## 2021-05-27 VITALS — BP 110/68 | HR 87 | Temp 98.6°F | Ht 66.0 in | Wt 156.4 lb

## 2021-05-27 DIAGNOSIS — E1169 Type 2 diabetes mellitus with other specified complication: Secondary | ICD-10-CM

## 2021-05-27 DIAGNOSIS — E669 Obesity, unspecified: Secondary | ICD-10-CM

## 2021-05-27 DIAGNOSIS — S0990XA Unspecified injury of head, initial encounter: Secondary | ICD-10-CM | POA: Diagnosis not present

## 2021-05-27 DIAGNOSIS — G7000 Myasthenia gravis without (acute) exacerbation: Secondary | ICD-10-CM | POA: Diagnosis not present

## 2021-05-27 MED ORDER — GLUCERNA SHAKE PO LIQD
237.0000 mL | Freq: Three times a day (TID) | ORAL | 11 refills | Status: DC
Start: 2021-05-27 — End: 2021-08-30
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
## 2021-05-27 NOTE — Patient Instructions (Addendum)
Have the company for the pull ups get in touch with [place].  ? ?We have given you the prescription for the glucerna and the lift chair. ? ? ?

## 2021-05-27 NOTE — Assessment & Plan Note (Signed)
Rx glucerna for BID prn given to patient today.  ?

## 2021-05-27 NOTE — Assessment & Plan Note (Signed)
Needs prescription for new lift chair which is done and given to her today.  ?
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
## 2021-05-31 ENCOUNTER — Encounter (HOSPITAL_COMMUNITY): Payer: Self-pay

## 2021-05-31 ENCOUNTER — Emergency Department (HOSPITAL_COMMUNITY): Payer: Medicare Other

## 2021-05-31 ENCOUNTER — Other Ambulatory Visit: Payer: Self-pay

## 2021-05-31 ENCOUNTER — Emergency Department (HOSPITAL_COMMUNITY)
Admission: EM | Admit: 2021-05-31 | Discharge: 2021-05-31 | Disposition: A | Discharge status: Home / Self Care | Payer: Medicare Other | Attending: Emergency Medicine | Admitting: Emergency Medicine

## 2021-05-31 DIAGNOSIS — Z7984 Long term (current) use of oral hypoglycemic drugs: Secondary | ICD-10-CM | POA: Diagnosis not present

## 2021-05-31 DIAGNOSIS — N9489 Other specified conditions associated with female genital organs and menstrual cycle: Secondary | ICD-10-CM | POA: Insufficient documentation

## 2021-05-31 DIAGNOSIS — R1084 Generalized abdominal pain: Secondary | ICD-10-CM | POA: Diagnosis not present

## 2021-05-31 DIAGNOSIS — D649 Anemia, unspecified: Secondary | ICD-10-CM | POA: Insufficient documentation

## 2021-05-31 DIAGNOSIS — R109 Unspecified abdominal pain: Secondary | ICD-10-CM | POA: Insufficient documentation

## 2021-05-31 DIAGNOSIS — K219 Gastro-esophageal reflux disease without esophagitis: Secondary | ICD-10-CM | POA: Insufficient documentation

## 2021-05-31 DIAGNOSIS — R110 Nausea: Secondary | ICD-10-CM | POA: Diagnosis not present

## 2021-05-31 DIAGNOSIS — K5900 Constipation, unspecified: Secondary | ICD-10-CM | POA: Diagnosis not present

## 2021-05-31 DIAGNOSIS — Z794 Long term (current) use of insulin: Secondary | ICD-10-CM | POA: Diagnosis not present

## 2021-05-31 DIAGNOSIS — E119 Type 2 diabetes mellitus without complications: Secondary | ICD-10-CM | POA: Insufficient documentation

## 2021-05-31 LAB — COMPREHENSIVE METABOLIC PANEL
ALT: 9 U/L (ref 0–44)
AST: 14 U/L — ABNORMAL LOW (ref 15–41)
Albumin: 3.4 g/dL — ABNORMAL LOW (ref 3.5–5.0)
Alkaline Phosphatase: 52 U/L (ref 38–126)
Anion gap: 8 (ref 5–15)
BUN: 17 mg/dL (ref 6–20)
CO2: 28 mmol/L (ref 22–32)
Calcium: 8.4 mg/dL — ABNORMAL LOW (ref 8.9–10.3)
Chloride: 102 mmol/L (ref 98–111)
Creatinine, Ser: 0.67 mg/dL (ref 0.44–1.00)
GFR, Estimated: >60 mL/min (ref >60)
Glucose, Bld: 92 mg/dL (ref 70–99)
Potassium: 3.5 mmol/L (ref 3.5–5.1)
Sodium: 138 mmol/L (ref 135–145)
Total Bilirubin: 0.5 mg/dL (ref 0.3–1.2)
Total Protein: 6.6 g/dL (ref 6.5–8.1)

## 2021-05-31 LAB — CBC WITH DIFFERENTIAL/PLATELET
Abs Immature Granulocytes: 0.07 10*3/uL (ref 0.00–0.07)
Basophils Absolute: 0.1 10*3/uL (ref 0.0–0.1)
Basophils Relative: 1 %
Eosinophils Absolute: 0.1 10*3/uL (ref 0.0–0.5)
Eosinophils Relative: 2 %
HCT: 35.2 % — ABNORMAL LOW (ref 36.0–46.0)
Hemoglobin: 10.8 g/dL — ABNORMAL LOW (ref 12.0–15.0)
Immature Granulocytes: 2 %
Lymphocytes Relative: 32 %
Lymphs Abs: 1.5 10*3/uL (ref 0.7–4.0)
MCH: 27 pg (ref 26.0–34.0)
MCHC: 30.7 g/dL (ref 30.0–36.0)
MCV: 88 fL (ref 80.0–100.0)
Monocytes Absolute: 0.3 10*3/uL (ref 0.1–1.0)
Monocytes Relative: 7 %
Neutro Abs: 2.6 10*3/uL (ref 1.7–7.7)
Neutrophils Relative %: 56 %
Platelets: 241 10*3/uL (ref 150–400)
RBC: 4 MIL/uL (ref 3.87–5.11)
RDW: 13.4 % (ref 11.5–15.5)
WBC: 4.7 10*3/uL (ref 4.0–10.5)
nRBC: 0 % (ref 0.0–0.2)

## 2021-05-31 LAB — HCG, QUANTITATIVE, PREGNANCY: hCG, Beta Chain, Quant, S: <1 m[IU]/mL (ref <5)

## 2021-05-31 MED ORDER — ONDANSETRON HCL 4 MG/2ML IJ SOLN
4.0000 mg | Freq: Once | INTRAMUSCULAR | Status: AC
  Administered 2021-05-31: 4 mg via INTRAVENOUS
  Filled 2021-05-31: qty 2

## 2021-05-31 MED ORDER — SODIUM CHLORIDE (PF) 0.9 % IJ SOLN
INTRAMUSCULAR | Status: AC
  Filled 2021-05-31: qty 50

## 2021-05-31 MED ORDER — SODIUM CHLORIDE 0.9 % IV BOLUS
2000.0000 mL | Freq: Once | INTRAVENOUS | Status: AC
  Administered 2021-05-31: 2000 mL via INTRAVENOUS

## 2021-05-31 MED ORDER — SENNOSIDES-DOCUSATE SODIUM 8.6-50 MG PO TABS: 1.0000 | ORAL_TABLET | Freq: Every day | ORAL | 0 refills | Status: DC

## 2021-05-31 MED ORDER — IOHEXOL 300 MG/ML  SOLN
100.0000 mL | Freq: Once | INTRAMUSCULAR | Status: AC | PRN
  Administered 2021-05-31: 100 mL via INTRAVENOUS

## 2021-05-31 MED ORDER — LACTATED RINGERS IV BOLUS
1000.0000 mL | Freq: Once | INTRAVENOUS | Status: AC
  Administered 2021-05-31: 1000 mL via INTRAVENOUS

## 2021-05-31 NOTE — ED Notes (Signed)
Pt updated on delay and plan of care.  Verbalized understanding.  ?

## 2021-05-31 NOTE — ED Notes (Signed)
ED Provider at bedside. 

## 2021-05-31 NOTE — Discharge Instructions (Addendum)
Continue drink plenty fluids at least 50 to 64 ounces of water a day.  I recommend a high-fiber diet.  You can take MiraLAX which is an over-the-counter laxative 2 capfuls per day every day to help with chronic constipation.  If you continue to have difficulty with constipation you can try the prescription constipation medication that I am prescribing only 1 to 2 days if you still have not had a bowel movement.  I recommend that you follow-up with your PCP for reevaluation.  Please return to the emergency department if you have continued abdominal pain, additional concerns. ?

## 2021-05-31 NOTE — ED Provider Notes (Signed)
Accepted handoff at shift change from [facility]. Please see prior provider note for more detail.  ? ?Briefly: Patient is 46 y.o.  ? ?DDX: concern for constipation, abdominal pain, hypotension -- considered acute GI bleed, SBO vs other. This is not an exhaustive differential. ? ?Plan: At time of handoff patient has received fluid resuscitation with 2 L normal saline bolus, medication for nausea, and is pending CT abdomen pelvis.  Patient with decreased hemoglobin since visit 10 days ago, she does endorse heavy menstrual period with concern for GI bleed.  She does have a negative Hemoccult.  CMP is overall unremarkable.  At this time we will plan for additional 1 L fluid bolus, CT abdomen pelvis with contrast.  If all clear patient is requesting to be discharged at this time. ? ?Patient with stable improvement of blood pressure, systolic 98 at this time.  She shows no signs of sepsis or orthostatic hypotension.  I personally interpreted CT abdomen pelvis which shows no acute intra-abdominal abnormalities.  Discussed with patient that her abdominal pain is likely secondary to constipation at this time.  Encouraged her to take over-the-counter MiraLAX, drink plenty fluids, and engage in physical activity to help with constipation.  Additionally will prescribe short prescription of senna/docusate to use if no improvement with above regimen.  Encourage follow-up with PCP.  Patient understands agrees to plan, discharged in stable condition at this time.  Pain overall improved at time of discharge. ?  ?Anselmo Pickler, PA-C ?05/31/21 0938 ? ?  ?Delora Fuel, MD ?18/29/93 2256 ? ?

## 2021-05-31 NOTE — ED Notes (Signed)
This Probation officer met the Pt in the room when she returned from [place].  Pt refused this writer's help with assistance to the restroom and refused EMT's offer to assist.  Pt would only help CT tech assist.     ?

## 2021-05-31 NOTE — ED Notes (Signed)
Now refusing to allow [facility] IV start after initially saying it was ok.  Patient was explained that she needed a larger IV for an infused CT. However, she refused.  She stated she wanted the IV team to come and try.  I advised they would basically need to place the IV in her [facility], she advised no they were not.  IV team consult order placed. ?

## 2021-05-31 NOTE — ED Notes (Signed)
EDP made aware Pt is requesting to speak to him.  ?

## 2021-05-31 NOTE — ED Provider Notes (Signed)
?[facility] DEPT ?Provider Note ? ? ?CSN: [id number] ?Arrival date & time: 05/31/21  0002 ? ?  ?History ? ?Chief Complaint  ?Patient presents with  ? Constipation  ? ? ?Jade Mathis is a 46 y.o. female with a history of bipolar 1 disorder, diabetes mellitus, fibromyalgia, GERD, myasthenia gravis, and prior abdominal surgeries including laparotomy, bariatric surgery, and cholecystectomy who presents to the emergency department with complaints of constipation.  Patient states she is been having problems with constipation since September 2022, she notes that her last bowel movement was approximately 2 weeks ago and was fairly normal, however since then she has not passed any stool.  She continues to pass gas.  She is having associated nausea as well as some abdominal discomfort.  She takes Linzess and Dulcolax and has been doing so without improvement currently.  She denies fever, chills, diarrhea, melena, hematochezia, emesis, or syncope.  She has not been eating or drinking much with poor p.o. intake. ? ?HPI ? ?  ? ?Home Medications ?Prior to Admission medications   ?Medication Sig Start Date End Date Taking? Authorizing Provider  ?ACCU-CHEK SOFTCLIX LANCETS lancets 1 each by Other route 4 (four) times daily. Use as instructed ?Patient taking differently: 1 each by Other route 2 (two) times daily. Use as instructed 05/29/16   Chancy Milroy, MD  ?albuterol (PROVENTIL) (2.5 MG/3ML) 0.083% nebulizer solution Take 3 mLs (2.5 mg total) by nebulization every 4 (four) hours as needed for wheezing or shortness of breath. 08/16/20   Mikhail, Velta Addison, DO  ?albuterol (VENTOLIN HFA) 108 (90 Base) MCG/ACT inhaler Inhale 2 puffs into the lungs every 6 (six) hours as needed for wheezing or shortness of breath. 08/16/20   Cristal Ford, DO  ?ALPRAZolam (XANAX) 1 MG tablet Take 1 mg by mouth 3 (three) times daily. 11/11/20   [provider]  ?aspirin-acetaminophen-caffeine (EXCEDRIN  MIGRAINE) [phone] MG tablet Take 1 tablet by mouth every 6 (six) hours as needed for headache or migraine.    [provider]  ?azaTHIOprine (IMURAN) 50 MG tablet Take 3 tablets (150 mg total) by mouth in the morning. 04/15/21   Narda Amber K, DO  ?b complex vitamins tablet Take 1 tablet by mouth daily.    [provider]  ?Blood Glucose Monitoring Suppl (ACCU-CHEK AVIVA PLUS) w/Device KIT Use to test blood sugar daily 01/24/19   Hoyt Koch, MD  ?cetirizine (ZYRTEC) 10 MG tablet TAKE 1 TABLET BY MOUTH EVERY DAY 08/17/20   Hoyt Koch, MD  ?cyclobenzaprine (FLEXERIL) 10 MG tablet Take 10 mg by mouth 3 (three) times daily as needed (for headaches).    [provider]  ?DULoxetine (CYMBALTA) 60 MG capsule Take 60 mg by mouth daily. 11/13/17   [provider]  ?empagliflozin (JARDIANCE) 25 MG TABS tablet Take 12.5 mg by mouth daily.    [provider]  ?Erenumab-aooe 140 MG/ML SOAJ Inject 140 mg into the skin every 30 (thirty) days.    [provider]  ?ergocalciferol (VITAMIN D2) 1.25 MG (50000 UT) capsule Take 50,000 Units by mouth every Sunday.    [provider]  ?estradiol (CLIMARA - DOSED IN MG/24 HR) 0.025 mg/24hr patch Place onto the skin. 05/01/17   [provider]  ?Fe Cbn-Fe Gluc-FA-B12-C-DSS (FERRALET 90) 90-1 MG TABS TAKE 1 TABLET BY MOUTH EVERY DAY ?Patient taking differently: Take 1 tablet by mouth daily. 04/09/20   Hoyt Koch, MD  ?feeding supplement, GLUCERNA SHAKE, ([place]) LIQD Take  237 mLs by mouth 3 (three) times daily between meals. 05/27/21   Hoyt Koch, MD  ?fluticasone Asencion Islam) 50 MCG/ACT nasal spray Place 1 spray into both nostrils daily. ?Patient taking differently: Place 1-2 sprays into both nostrils daily as needed for allergies or rhinitis. 06/04/18   Delia Heady, PA-C  ?Fluticasone-Salmeterol (ADVAIR) 100-50 MCG/DOSE AEPB Inhale 1 puff into the lungs 2 (two) times  daily. 08/16/20   Cristal Ford, DO  ?gabapentin (NEURONTIN) 400 MG capsule Take 400 mg by mouth 3 (three) times daily. 11/13/17   [provider]  ?glucose blood (ONETOUCH VERIO) test strip Use as instructed BID 05/04/21   Hoyt Koch, MD  ?hydrOXYzine (ATARAX/VISTARIL) 10 MG tablet Take 10 mg by mouth 3 (three) times daily as needed for itching.    [provider]  ?ibuprofen (ADVIL) 800 MG tablet Take 800 mg by mouth every 8 (eight) hours as needed for mild pain or headache.    [provider]  ?Insulin Pen Needle 31G X 5 MM MISC Use daily with insulin pen 07/15/18   Elayne Snare, MD  ?levothyroxine (SYNTHROID) 137 MCG tablet Take 1 tablet (137 mcg total) by mouth daily at 6 (six) AM. 08/17/20   Cristal Ford, DO  ?linaclotide (LINZESS) 290 MCG CAPS capsule Take 290 mcg by mouth daily before breakfast.    [provider]  ?LORazepam (ATIVAN) 1 MG tablet Take 1 tablet (1 mg total) by mouth every 6 (six) hours as needed for anxiety. 05/20/21   Prosperi, Christian H, PA-C  ?methocarbamol (ROBAXIN) 500 MG tablet Take 1 tablet (500 mg total) by mouth 2 (two) times daily. 05/20/21   Prosperi, Darrick Meigs H, PA-C  ?Multiple Vitamins-Minerals (CENTRUM WOMEN) TABS Take 1 tablet by mouth daily with breakfast.    [provider]  ?naproxen (EC NAPROSYN) 500 MG EC tablet Take 500 mg by mouth 2 (two) times daily. 11/08/20   [provider]  ?omega-3 acid ethyl esters (LOVAZA) 1 g capsule TAKE 2 CAPSULES (2 G TOTAL) BY MOUTH 2 (TWO) TIMES DAILY. 07/03/19   Hoyt Koch, MD  ?ondansetron (ZOFRAN-ODT) 4 MG disintegrating tablet Take 1 tablet (4 mg total) by mouth every 8 (eight) hours as needed for nausea or vomiting. 11/29/20   Hoyt Koch, MD  ?Jonetta Speak Lancets 59D MISC Use twice a day 05/04/21   Hoyt Koch, MD  ?oxyCODONE (ROXICODONE) 15 MG immediate release tablet Take 15 mg by mouth 4 (four) times daily. 03/23/17   [provider]  ?pantoprazole (PROTONIX) 40 MG tablet TAKE 1 TABLET BY MOUTH EVERY DAY ?Patient taking differently: Take 40 mg by mouth daily before breakfast. 10/04/18   Woodroe Mode, MD  ?phentermine (ADIPEX-P) 37.5 MG tablet Take 37.5 mg by mouth daily. 12/11/20   [provider]  ?prazosin (MINIPRESS) 1 MG capsule Take 1 mg by mouth daily.    [provider]  ?predniSONE (DELTASONE) 10 MG tablet Take 1 tablet (10 mg total) by mouth daily. 04/15/21   Narda Amber K, DO  ?prochlorperazine (COMPAZINE) 10 MG tablet Take 1 tablet (10 mg total) by mouth 2 (two) times daily as needed for nausea. 03/06/18   Maudie Flakes, MD  ?promethazine (PHENERGAN) 12.5 MG tablet Take 1 tablet (12.5 mg total) by mouth every 8 (eight) hours as needed for nausea or vomiting. 11/29/20   Hoyt Koch, MD  ?pyridostigmine (MESTINON) 60 MG tablet TAKE 1 TABLET (60 MG TOTAL) BY MOUTH 3 TIMES DAILY. OK to  take extra dose as needed 04/15/21   Alda Berthold, DO  ?QUEtiapine (SEROQUEL) 400 MG tablet Take 400 mg by mouth at bedtime. 05/03/21   [provider]  ?Semaglutide (OZEMPIC, 1 MG/DOSE, [place]) Inject 1 mg into the skin every Sunday.    [provider]  ?SUMAtriptan (IMITREX) 100 MG tablet Take 100 mg by mouth as needed for migraine (and may repeat in 2 hours if headache persists or recurs).    [provider]  ?SYNTHROID 150 MCG tablet Take 150 mcg by mouth daily. 10/08/20   [provider]  ?topiramate (TOPAMAX) 200 MG tablet Take 200 mg by mouth daily in the afternoon.    [provider]  ?traZODone (DESYREL) 100 MG tablet Take 1 tablet (100 mg total) by mouth at bedtime. 01/07/19   Hoyt Koch, MD  ?triamcinolone (KENALOG) 0.1 % Apply 1 application topically 2 (two) times daily. ?Patient taking differently: Apply 1 application. topically 2 (two) times daily as needed (for irritation). 02/05/20   Hoyt Koch, MD  ?valACYclovir (VALTREX) 500 MG tablet Take 1  tablet (500 mg total) by mouth 2 (two) times daily. ?Patient taking differently: Take 500 mg by mouth 2 (two) times daily as needed (as directed for breakouts). 04/27/20   Hoyt Koch, MD  ?   ? ?Allerg

## 2021-05-31 NOTE — ED Notes (Signed)
EDP notified Lab of add-on.  ?

## 2021-05-31 NOTE — ED Triage Notes (Signed)
Pt reports with constipation all month. Pt states that she has a bowel movement twice a month.  ?

## 2021-06-01 ENCOUNTER — Telehealth: Payer: Self-pay | Admitting: Internal Medicine

## 2021-06-01 NOTE — Telephone Encounter (Signed)
She was supposed to follow up with sports medicine about this has she done that? ?

## 2021-06-01 NOTE — Telephone Encounter (Signed)
Patient calling in ? ?Patient was in [place] 03/03 & was seen in office 03/10 to fu ? ?Patient would like for provider to submit referral to [facility] on [facility]. ? ?Please fu w/ patient when referral has been submitted [phone] ?

## 2021-06-02 NOTE — Telephone Encounter (Signed)
Spoke with the pt and she stated she has a upcoming appt with sports medicine on 06/15/2021. She would like to know your opinion on going to PT. ?

## 2021-06-04 DIAGNOSIS — M545 Low back pain, unspecified: Secondary | ICD-10-CM | POA: Diagnosis not present

## 2021-06-04 DIAGNOSIS — K5909 Other constipation: Secondary | ICD-10-CM | POA: Diagnosis not present

## 2021-06-04 DIAGNOSIS — G8929 Other chronic pain: Secondary | ICD-10-CM | POA: Diagnosis not present

## 2021-06-04 DIAGNOSIS — E119 Type 2 diabetes mellitus without complications: Secondary | ICD-10-CM | POA: Diagnosis not present

## 2021-06-04 DIAGNOSIS — Z79899 Other long term (current) drug therapy: Secondary | ICD-10-CM | POA: Diagnosis not present

## 2021-06-06 NOTE — Telephone Encounter (Signed)
I would recommend she see sports medicine first.  ?

## 2021-06-07 DIAGNOSIS — E785 Hyperlipidemia, unspecified: Secondary | ICD-10-CM | POA: Diagnosis not present

## 2021-06-07 DIAGNOSIS — E039 Hypothyroidism, unspecified: Secondary | ICD-10-CM | POA: Diagnosis not present

## 2021-06-07 DIAGNOSIS — E119 Type 2 diabetes mellitus without complications: Secondary | ICD-10-CM | POA: Diagnosis not present
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
## 2021-06-15 ENCOUNTER — Ambulatory Visit: Payer: Medicare Other | Admitting: Family Medicine

## 2021-06-20 ENCOUNTER — Other Ambulatory Visit (HOSPITAL_COMMUNITY): Payer: Self-pay

## 2021-06-23 DIAGNOSIS — E039 Hypothyroidism, unspecified: Secondary | ICD-10-CM | POA: Diagnosis not present

## 2021-06-23 DIAGNOSIS — K912 Postsurgical malabsorption, not elsewhere classified: Secondary | ICD-10-CM | POA: Diagnosis not present

## 2021-06-23 DIAGNOSIS — Z903 Acquired absence of stomach [part of]: Secondary | ICD-10-CM | POA: Diagnosis not present

## 2021-07-03 ENCOUNTER — Emergency Department (HOSPITAL_BASED_OUTPATIENT_CLINIC_OR_DEPARTMENT_OTHER): Payer: Medicare Other

## 2021-07-03 ENCOUNTER — Encounter (HOSPITAL_BASED_OUTPATIENT_CLINIC_OR_DEPARTMENT_OTHER): Payer: Self-pay | Admitting: Emergency Medicine

## 2021-07-03 ENCOUNTER — Emergency Department (HOSPITAL_BASED_OUTPATIENT_CLINIC_OR_DEPARTMENT_OTHER)
Admission: EM | Admit: 2021-07-03 | Discharge: 2021-07-03 | Disposition: A | Discharge status: Home / Self Care | Payer: Medicare Other | Attending: Emergency Medicine | Admitting: Emergency Medicine

## 2021-07-03 ENCOUNTER — Other Ambulatory Visit: Payer: Self-pay

## 2021-07-03 DIAGNOSIS — Z7952 Long term (current) use of systemic steroids: Secondary | ICD-10-CM | POA: Diagnosis not present

## 2021-07-03 DIAGNOSIS — E119 Type 2 diabetes mellitus without complications: Secondary | ICD-10-CM | POA: Insufficient documentation

## 2021-07-03 DIAGNOSIS — M79645 Pain in left finger(s): Secondary | ICD-10-CM | POA: Diagnosis not present

## 2021-07-03 DIAGNOSIS — M62838 Other muscle spasm: Secondary | ICD-10-CM

## 2021-07-03 DIAGNOSIS — Z794 Long term (current) use of insulin: Secondary | ICD-10-CM | POA: Diagnosis not present

## 2021-07-03 DIAGNOSIS — J45909 Unspecified asthma, uncomplicated: Secondary | ICD-10-CM | POA: Diagnosis not present

## 2021-07-03 DIAGNOSIS — Z7951 Long term (current) use of inhaled steroids: Secondary | ICD-10-CM | POA: Diagnosis not present

## 2021-07-03 DIAGNOSIS — F419 Anxiety disorder, unspecified: Secondary | ICD-10-CM | POA: Diagnosis not present

## 2021-07-03 MED ORDER — LORAZEPAM 1 MG PO TABS: 1.0000 mg | ORAL_TABLET | Freq: Three times a day (TID) | ORAL | 0 refills | Status: DC | PRN

## 2021-07-03 MED ORDER — METHOCARBAMOL 500 MG PO TABS: 500.0000 mg | ORAL_TABLET | Freq: Two times a day (BID) | ORAL | 0 refills | Status: DC | PRN

## 2021-07-03 NOTE — ED Notes (Signed)
Pt upset this RN did not know when the MD would be in her room, "i've  been waiting an hour". RN had given pt snack and warm blanket while waiting, explaining large volume of patients. Patient was in RN's face, made a motion with her thumb pointing down and stated "your customer service sucks". ?

## 2021-07-03 NOTE — ED Provider Notes (Signed)
?[facility] EMERGENCY DEPT ?Provider Note ? ? ?CSN: [id number] ?Arrival date & time: 07/03/21  0948 ? ?  ? ?History ? ?Chief Complaint  ?Patient presents with  ? Finger Injury  ? ? ?Jade Mathis is a 45 y.o. female. ? ?The history is provided by the patient and medical records. No language interpreter was used.  ?Hand Pain ?This is a recurrent problem. The current episode started more than 2 days ago. The problem occurs constantly. The problem has not changed since onset.Pertinent negatives include no chest pain, no abdominal pain, no headaches and no shortness of breath. Nothing aggravates the symptoms. Nothing relieves the symptoms. She has tried nothing for the symptoms. The treatment provided no relief.  ? ?  ? ?Home Medications ?Prior to Admission medications   ?Medication Sig Start Date End Date Taking? Authorizing Provider  ?ACCU-CHEK SOFTCLIX LANCETS lancets 1 each by Other route 4 (four) times daily. Use as instructed ?Patient taking differently: 1 each by Other route 2 (two) times daily. Use as instructed 05/29/16   Ervin, Michael L, MD  ?albuterol (PROVENTIL) (2.5 MG/3ML) 0.083% nebulizer solution Take 3 mLs (2.5 mg total) by nebulization every 4 (four) hours as needed for wheezing or shortness of breath. 08/16/20   Mikhail, Maryann, DO  ?albuterol (VENTOLIN HFA) 108 (90 Base) MCG/ACT inhaler Inhale 2 puffs into the lungs every 6 (six) hours as needed for wheezing or shortness of breath. 08/16/20   Mikhail, Maryann, DO  ?ALPRAZolam (XANAX) 1 MG tablet Take 1 mg by mouth 3 (three) times daily. 11/11/20   [provider]  ?aspirin-acetaminophen-caffeine (EXCEDRIN MIGRAINE) 250-250-65 MG tablet Take 1 tablet by mouth every 6 (six) hours as needed for headache or migraine.    [provider]  ?azaTHIOprine (IMURAN) 50 MG tablet Take 3 tablets (150 mg total) by mouth in the morning. 04/15/21   Patel, Donika K, DO  ?b complex vitamins tablet Take 1 tablet by mouth daily.     [provider]  ?Blood Glucose Monitoring Suppl (ACCU-CHEK AVIVA PLUS) w/Device KIT Use to test blood sugar daily 01/24/19   Crawford, Elizabeth A, MD  ?cetirizine (ZYRTEC) 10 MG tablet TAKE 1 TABLET BY MOUTH EVERY DAY 08/17/20   Crawford, Elizabeth A, MD  ?cyclobenzaprine (FLEXERIL) 10 MG tablet Take 10 mg by mouth 3 (three) times daily as needed (for headaches).    [provider]  ?DULoxetine (CYMBALTA) 60 MG capsule Take 60 mg by mouth daily. 11/13/17   [provider]  ?empagliflozin (JARDIANCE) 25 MG TABS tablet Take 12.5 mg by mouth daily.    [provider]  ?Erenumab-aooe 140 MG/ML SOAJ Inject 140 mg into the skin every 30 (thirty) days.    [provider]  ?ergocalciferol (VITAMIN D2) 1.25 MG (50000 UT) capsule Take 50,000 Units by mouth every Sunday.    [provider]  ?estradiol (CLIMARA - DOSED IN MG/24 HR) 0.025 mg/24hr patch Place onto the skin. 05/01/17   [provider]  ?Fe Cbn-Fe Gluc-FA-B12-C-DSS (FERRALET 90) 90-1 MG TABS TAKE 1 TABLET BY MOUTH EVERY DAY ?Patient taking differently: Take 1 tablet by mouth daily. 04/09/20   Crawford, Elizabeth A, MD  ?feeding supplement, GLUCERNA SHAKE, (GLUCERNA SHAKE) LIQD Take 237 mLs by mouth 3 (three) times daily between meals. 05/27/21   Crawford, Elizabeth A, MD  ?fluticasone (FLONASE) 50 MCG/ACT nasal spray Place 1 spray into both nostrils daily. ?Patient taking differently: Place 1-2 sprays into both nostrils daily as needed for allergies or rhinitis. 06/04/18     Delia Heady, PA-C  ?Fluticasone-Salmeterol (ADVAIR) 100-50 MCG/DOSE AEPB Inhale 1 puff into the lungs 2 (two) times daily. 08/16/20   Cristal Ford, DO  ?gabapentin (NEURONTIN) 400 MG capsule Take 400 mg by mouth 3 (three) times daily. 11/13/17   [provider]  ?glucose blood (ONETOUCH VERIO) test strip Use as instructed BID 05/04/21   Hoyt Koch, MD  ?hydrOXYzine (ATARAX/VISTARIL) 10 MG tablet Take 10 mg by mouth 3  (three) times daily as needed for itching.    [provider]  ?ibuprofen (ADVIL) 800 MG tablet Take 800 mg by mouth every 8 (eight) hours as needed for mild pain or headache.    [provider]  ?Insulin Pen Needle 31G X 5 MM MISC Use daily with insulin pen 07/15/18   Elayne Snare, MD  ?levothyroxine (SYNTHROID) 137 MCG tablet Take 1 tablet (137 mcg total) by mouth daily at 6 (six) AM. 08/17/20   Cristal Ford, DO  ?linaclotide (LINZESS) 290 MCG CAPS capsule Take 290 mcg by mouth daily before breakfast.    [provider]  ?LORazepam (ATIVAN) 1 MG tablet Take 1 tablet (1 mg total) by mouth every 6 (six) hours as needed for anxiety. 05/20/21   Prosperi, Christian H, PA-C  ?methocarbamol (ROBAXIN) 500 MG tablet Take 1 tablet (500 mg total) by mouth 2 (two) times daily. 05/20/21   Prosperi, Darrick Meigs H, PA-C  ?Multiple Vitamins-Minerals (CENTRUM WOMEN) TABS Take 1 tablet by mouth daily with breakfast.    [provider]  ?naproxen (EC NAPROSYN) 500 MG EC tablet Take 500 mg by mouth 2 (two) times daily. 11/08/20   [provider]  ?omega-3 acid ethyl esters (LOVAZA) 1 g capsule TAKE 2 CAPSULES (2 G TOTAL) BY MOUTH 2 (TWO) TIMES DAILY. 07/03/19   Hoyt Koch, MD  ?ondansetron (ZOFRAN-ODT) 4 MG disintegrating tablet Take 1 tablet (4 mg total) by mouth every 8 (eight) hours as needed for nausea or vomiting. 11/29/20   Hoyt Koch, MD  ?Jonetta Speak Lancets 12X MISC Use twice a day 05/04/21   Hoyt Koch, MD  ?oxyCODONE (ROXICODONE) 15 MG immediate release tablet Take 15 mg by mouth 4 (four) times daily. 03/23/17   [provider]  ?pantoprazole (PROTONIX) 40 MG tablet TAKE 1 TABLET BY MOUTH EVERY DAY ?Patient taking differently: Take 40 mg by mouth daily before breakfast. 10/04/18   Woodroe Mode, MD  ?phentermine (ADIPEX-P) 37.5 MG tablet Take 37.5 mg by mouth daily. 12/11/20   [provider]  ?prazosin (MINIPRESS) 1 MG capsule Take 1  mg by mouth daily.    [provider]  ?predniSONE (DELTASONE) 10 MG tablet Take 1 tablet (10 mg total) by mouth daily. 04/15/21   Narda Amber K, DO  ?prochlorperazine (COMPAZINE) 10 MG tablet Take 1 tablet (10 mg total) by mouth 2 (two) times daily as needed for nausea. 03/06/18   Maudie Flakes, MD  ?promethazine (PHENERGAN) 12.5 MG tablet Take 1 tablet (12.5 mg total) by mouth every 8 (eight) hours as needed for nausea or vomiting. 11/29/20   Hoyt Koch, MD  ?pyridostigmine (MESTINON) 60 MG tablet TAKE 1 TABLET (60 MG TOTAL) BY MOUTH 3 TIMES DAILY. OK to take extra dose as needed 04/15/21   Alda Berthold, DO  ?QUEtiapine (SEROQUEL) 400 MG tablet Take 400 mg by mouth at bedtime. 05/03/21   [provider]  ?Semaglutide (OZEMPIC, 1 MG/DOSE, [place]) Inject 1 mg into the skin every Sunday.    [provider]  ?senna-docusate (SENOKOT-S) 8.6-50 MG tablet Take 1 tablet by mouth daily. 05/31/21   Prosperi, Christian H, PA-C  ?SUMAtriptan (IMITREX) 100 MG tablet Take 100 mg by mouth as needed for migraine (and may repeat in 2 hours if headache persists or recurs).    [provider]  ?SYNTHROID 150 MCG tablet Take 150 mcg by mouth daily. 10/08/20   [provider]  ?topiramate (TOPAMAX) 200 MG tablet Take 200 mg by mouth daily in the afternoon.    [provider]  ?traZODone (DESYREL) 100 MG tablet Take 1 tablet (100 mg total) by mouth at bedtime. 01/07/19   Crawford, Elizabeth A, MD  ?triamcinolone (KENALOG) 0.1 % Apply 1 application topically 2 (two) times daily. ?Patient taking differently: Apply 1 application. topically 2 (two) times daily as needed (for irritation). 02/05/20   Crawford, Elizabeth A, MD  ?valACYclovir (VALTREX) 500 MG tablet Take 1 tablet (500 mg total) by mouth 2 (two) times daily. ?Patient taking differently: Take 500 mg by mouth 2 (two) times daily as needed (as directed for breakouts). 04/27/20   Crawford, Elizabeth A, MD  ?    ? ?Allergies    ?Depo-provera [medroxyprogesterone], Hydrocodone-acetaminophen, Medroxyprogesterone acetate, Other, and Vicodin [hydrocodone-acetaminophen]   ? ?Review of Systems   ?Review of Systems  ?Constitutional
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
## 2021-07-04 ENCOUNTER — Telehealth: Payer: Self-pay | Admitting: Internal Medicine

## 2021-07-04 DIAGNOSIS — G8929 Other chronic pain: Secondary | ICD-10-CM

## 2021-07-04 NOTE — Telephone Encounter (Signed)
Pt is requesting a referral to [facility]  [address], [place], [place] [address] ? ?Pt states she is a current pt but they are now requiring a referral ?

## 2021-07-05 DIAGNOSIS — G8929 Other chronic pain: Secondary | ICD-10-CM | POA: Diagnosis not present

## 2021-07-05 DIAGNOSIS — E119 Type 2 diabetes mellitus without complications: Secondary | ICD-10-CM | POA: Diagnosis not present

## 2021-07-05 DIAGNOSIS — Z79899 Other long term (current) drug therapy: Secondary | ICD-10-CM | POA: Diagnosis not present

## 2021-07-05 DIAGNOSIS — M545 Low back pain, unspecified: Secondary | ICD-10-CM | POA: Diagnosis not present

## 2021-07-06 ENCOUNTER — Telehealth: Payer: Self-pay | Admitting: Orthopedic Surgery

## 2021-07-06 NOTE — Telephone Encounter (Signed)
Pt called and would like to know if Marlou Sa or Lurena Joiner can see her before wed next week. She states she has already been back to the hospital in the meantime.  ? ?CB [phone] ?

## 2021-07-06 NOTE — Telephone Encounter (Signed)
IC rescheduled 

## 2021-07-06 NOTE — Telephone Encounter (Signed)
Spoke with the pt and she stated the referral is needed for pain management.  ?

## 2021-07-07 NOTE — Telephone Encounter (Signed)
Referral placed.

## 2021-07-08 DIAGNOSIS — M064 Inflammatory polyarthropathy: Secondary | ICD-10-CM | POA: Diagnosis not present

## 2021-07-08 DIAGNOSIS — Q059 Spina bifida, unspecified: Secondary | ICD-10-CM | POA: Diagnosis not present

## 2021-07-08 DIAGNOSIS — G7000 Myasthenia gravis without (acute) exacerbation: Secondary | ICD-10-CM | POA: Diagnosis not present

## 2021-07-08 DIAGNOSIS — D84821 Immunodeficiency due to drugs: Secondary | ICD-10-CM | POA: Diagnosis not present

## 2021-07-08 DIAGNOSIS — E1169 Type 2 diabetes mellitus with other specified complication: Secondary | ICD-10-CM | POA: Diagnosis not present

## 2021-07-08 DIAGNOSIS — Z79899 Other long term (current) drug therapy: Secondary | ICD-10-CM | POA: Diagnosis not present

## 2021-07-12 ENCOUNTER — Ambulatory Visit (INDEPENDENT_AMBULATORY_CARE_PROVIDER_SITE_OTHER): Payer: Medicare Other | Admitting: Orthopedic Surgery

## 2021-07-12 ENCOUNTER — Encounter: Payer: Self-pay | Admitting: Orthopedic Surgery

## 2021-07-12 VITALS — BP 113/70 | HR 93 | Ht 66.0 in | Wt 154.1 lb

## 2021-07-12 DIAGNOSIS — Z79899 Other long term (current) drug therapy: Secondary | ICD-10-CM | POA: Diagnosis not present

## 2021-07-12 DIAGNOSIS — S63639A Sprain of interphalangeal joint of unspecified finger, initial encounter: Secondary | ICD-10-CM | POA: Insufficient documentation

## 2021-07-12 DIAGNOSIS — M79672 Pain in left foot: Secondary | ICD-10-CM | POA: Diagnosis not present

## 2021-07-12 MED ORDER — MELOXICAM 7.5 MG PO TABS
7.5000 mg | ORAL_TABLET | Freq: Every day | ORAL | 0 refills | Status: AC
Start: 2021-07-12 — End: 2021-08-11
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
## 2021-07-13 ENCOUNTER — Ambulatory Visit: Payer: Medicare Other | Admitting: Surgical

## 2021-07-13 DIAGNOSIS — Z79899 Other long term (current) drug therapy: Secondary | ICD-10-CM | POA: Diagnosis not present

## 2021-07-14 ENCOUNTER — Encounter: Payer: Self-pay | Admitting: Occupational Therapy

## 2021-07-14 ENCOUNTER — Ambulatory Visit: Payer: Medicare Other | Attending: Orthopedic Surgery | Admitting: Occupational Therapy

## 2021-07-14 DIAGNOSIS — M79642 Pain in left hand: Secondary | ICD-10-CM | POA: Diagnosis not present

## 2021-07-14 DIAGNOSIS — M25642 Stiffness of left hand, not elsewhere classified: Secondary | ICD-10-CM | POA: Diagnosis not present

## 2021-07-14 DIAGNOSIS — M6281 Muscle weakness (generalized): Secondary | ICD-10-CM | POA: Insufficient documentation

## 2021-07-14 DIAGNOSIS — R278 Other lack of coordination: Secondary | ICD-10-CM | POA: Insufficient documentation
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
## 2021-07-19 ENCOUNTER — Ambulatory Visit (INDEPENDENT_AMBULATORY_CARE_PROVIDER_SITE_OTHER): Payer: Medicare Other | Admitting: Podiatry

## 2021-07-19 ENCOUNTER — Encounter: Payer: Self-pay | Admitting: Podiatry

## 2021-07-19 ENCOUNTER — Ambulatory Visit: Payer: Medicare Other

## 2021-07-19 DIAGNOSIS — M2041 Other hammer toe(s) (acquired), right foot: Secondary | ICD-10-CM

## 2021-07-19 DIAGNOSIS — M79675 Pain in left toe(s): Secondary | ICD-10-CM

## 2021-07-19 DIAGNOSIS — E0842 Diabetes mellitus due to underlying condition with diabetic polyneuropathy: Secondary | ICD-10-CM

## 2021-07-19 DIAGNOSIS — M2010 Hallux valgus (acquired), unspecified foot: Secondary | ICD-10-CM

## 2021-07-19 DIAGNOSIS — M2042 Other hammer toe(s) (acquired), left foot: Secondary | ICD-10-CM

## 2021-07-19 DIAGNOSIS — M79674 Pain in right toe(s): Secondary | ICD-10-CM

## 2021-07-19 DIAGNOSIS — E669 Obesity, unspecified: Secondary | ICD-10-CM

## 2021-07-19 NOTE — Progress Notes (Signed)
This patient presents to the office for painful ingrown nails both borders left big toe.  She says the nail is painful walking and wearing her shoes.  No drainage or infection.  She also is scheduled to be seen by Aaron Edelman  for diabetic shoes.  She has diagnosed with diabetic neuropathy and is taking gabapentin. ? ?General Appearance  Alert, conversant and in no acute stress. ? ?Vascular  Dorsalis pedis and posterior tibial  pulses are palpable  bilaterally.  Capillary return is within normal limits  bilaterally. Temperature is within normal limits  bilaterally. ? ?Neurologic  Senn-Weinstein monofilament wire test diminished  bilaterally. Muscle power within normal limits bilaterally. ? ?Nails Ingrown toenail medial and lateral borders left great toenaol.. No evidence of bacterial infection or drainage bilaterally. ? ?Orthopedic  No limitations of motion  feet .  No crepitus or effusions noted.  HAV  B/L.  Hammer toes 2-5  B/L. ? ?Skin  normotropic skin with no porokeratosis noted bilaterally.  No signs of infections or ulcers noted ? ?Marland KitchenIngrown  Toenails medial and lateral borders ? ?ROV.  Debride nail borders left hallux.  Diabetic foot exam has no vascular pathology.  LOPS diminished.  Patient qualifies for diabetic shoes due to DPN, HAV  B/L and hammer toes B/L.  ? ?RTC  1 year ? ? ?Gardiner Barefoot DPM ?

## 2021-07-19 NOTE — Progress Notes (Signed)
SITUATION ?Reason for Consult: Evaluation for Prefabricated Diabetic Shoes and Custom Diabetic Inserts. ?Patient / Caregiver Report: Patient would like well fitting shoes ? ?OBJECTIVE DATA: ?Patient History / Diagnosis:  ?  ICD-10-CM   ?1. Diabetes mellitus type 2 in obese ([place])  E11.69   ? E66.9   ?  ? ? ?Physician Treating Diabetes:  Hoyt Koch, MD ? ?Current or Previous Devices:   Current user ? ?In-Person Foot Examination: ?Ulcers & Callousing:   Historical ?Deformities:    Pes planus, bilateral ?Sensation:    Compromised  ?Shoe Size:     49M ? ?ORTHOTIC RECOMMENDATION ?Recommended Devices: ?- 1x pair prefabricated PDAC approved diabetic shoes; Patient Selected Orthofeet [place] Blue Size 49M ?- 3x pair custom-to-patient PDAC approved vacuum formed diabetic insoles. ? ?GOALS OF SHOES AND INSOLES ?- Reduce shear and pressure ?- Reduce / Prevent callus formation ?- Reduce / Prevent ulceration ?- Protect the fragile healing compromised diabetic foot. ? ?Patient would benefit from diabetic shoes and inserts as patient has diabetes mellitus and the patient has one or more of the following conditions: ?- History of partial or complete amputation of the foot ?- History of previous foot ulceration. ?- History of pre-ulcerative callus ?- Peripheral neuropathy with evidence of callus formation ?- Foot deformity ?- Poor circulation ? ?ACTIONS PERFORMED ?Potential out of pocket cost was communicated to patient. Patient understood and consented to measurement and casting. Patient was casted for insoles via crush box and measured for shoes via brannock device. Procedure was explained and patient tolerated procedure well. All questions were answered and concerns addressed. Casts were shipped to central fabrication for HOLD until Certificate of Medical Necessity or otherwise necessary authorization from insurance is obtained. ? ?PLAN ?Shoes are to be ordered and casts released from hold once all appropriate paperwork  is complete. Patient is to be contacted and scheduled for fitting once shoes and insoles have been fabricated and received. ? ?

## 2021-07-20 ENCOUNTER — Encounter: Payer: Self-pay | Admitting: Internal Medicine

## 2021-07-20 ENCOUNTER — Ambulatory Visit (INDEPENDENT_AMBULATORY_CARE_PROVIDER_SITE_OTHER): Payer: Medicare Other | Admitting: Internal Medicine

## 2021-07-20 VITALS — BP 126/70 | HR 76 | Resp 18 | Ht 66.0 in | Wt 166.6 lb

## 2021-07-20 DIAGNOSIS — E669 Obesity, unspecified: Secondary | ICD-10-CM | POA: Diagnosis not present

## 2021-07-20 DIAGNOSIS — S63639A Sprain of interphalangeal joint of unspecified finger, initial encounter: Secondary | ICD-10-CM

## 2021-07-20 DIAGNOSIS — E1169 Type 2 diabetes mellitus with other specified complication: Secondary | ICD-10-CM | POA: Diagnosis not present

## 2021-07-20 DIAGNOSIS — E890 Postprocedural hypothyroidism: Secondary | ICD-10-CM | POA: Diagnosis not present

## 2021-07-20 LAB — TSH: TSH: 1.42 u[IU]/mL (ref 0.35–5.50)

## 2021-07-20 LAB — T4, FREE: Free T4: 0.85 ng/dL (ref 0.60–1.60)

## 2021-07-20 NOTE — Progress Notes (Signed)
? ?  Subjective:  ? ?Patient ID: Jade Mathis, female    DOB: 09/03/1975, 46 y.o.   MRN: [id number] ? ?HPI ?The patient is a 46 YO female coming in for concerns about recent thyroid labs. Taking levothyroxine 175 mcg daily she thinks although cannot tell me for sure.  ? ?Review of Systems  ?Constitutional:  Positive for activity change. Negative for appetite change, chills, fatigue, fever and unexpected weight change.  ?HENT: Negative.    ?Respiratory: Negative.    ?Cardiovascular: Negative.   ?Gastrointestinal:  Positive for abdominal pain.  ?Musculoskeletal:  Positive for arthralgias, back pain and myalgias. Negative for gait problem and joint swelling.  ?Skin: Negative.   ?Neurological: Negative.   ? ?Objective:  ?Physical Exam ?Constitutional:   ?   Appearance: She is well-developed.  ?HENT:  ?   Head: Normocephalic and atraumatic.  ?Cardiovascular:  ?   Rate and Rhythm: Normal rate and regular rhythm.  ?Pulmonary:  ?   Effort: Pulmonary effort is normal. No respiratory distress.  ?   Breath sounds: Normal breath sounds. No wheezing or rales.  ?Abdominal:  ?   General: Bowel sounds are normal. There is no distension.  ?   Palpations: Abdomen is soft.  ?   Tenderness: There is no abdominal tenderness. There is no rebound.  ?Musculoskeletal:     ?   General: Tenderness present.  ?   Cervical back: Normal range of motion.  ?Skin: ?   General: Skin is warm and dry.  ?Neurological:  ?   Mental Status: She is alert and oriented to person, place, and time.  ?   Coordination: Coordination normal.  ? ? ?Vitals:  ? 07/20/21 1000  ?BP: 126/70  ?Pulse: 76  ?Resp: 18  ?SpO2: 93%  ?Weight: 166 lb 9.6 oz (75.6 kg)  ?Height: '5\' 6"'$  (1.676 m)  ? ? ?This visit occurred during the SARS-CoV-2 public health emergency.  Safety protocols were in place, including screening questions prior to the visit, additional usage of staff PPE, and extensive cleaning of exam room while observing appropriate contact time as indicated for  disinfecting solutions.  ? ?Assessment & Plan:  ? ?

## 2021-07-21 ENCOUNTER — Ambulatory Visit: Payer: Medicare Other | Attending: Orthopedic Surgery | Admitting: Occupational Therapy

## 2021-07-21 DIAGNOSIS — M6281 Muscle weakness (generalized): Secondary | ICD-10-CM | POA: Diagnosis not present

## 2021-07-21 DIAGNOSIS — M25642 Stiffness of left hand, not elsewhere classified: Secondary | ICD-10-CM | POA: Insufficient documentation

## 2021-07-21 DIAGNOSIS — R278 Other lack of coordination: Secondary | ICD-10-CM | POA: Diagnosis not present

## 2021-07-21 DIAGNOSIS — M79642 Pain in left hand: Secondary | ICD-10-CM | POA: Insufficient documentation
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
## 2021-07-21 NOTE — Patient Instructions (Signed)
PIP Flexion (Active) ? ? ? ?Keeping large knuckles straight, bend the middle joint of __ALL____ finger, or of all fingers, as far as possible. Hold __3__ seconds. ?Repeat _10___ times. Do _3-4___ sessions per day. Can wear buddy strap for this exercise only ? ?AROM: PIP Flexion / Extension ? ? ? ?Pinch bottom knuckle of ___small_____ finger of right hand to prevent bending. Actively bend middle knuckle until stretch is felt. Hold __3__ seconds. Relax. Straighten finger as far as possible. ?Repeat _10___ times per set. Do ____ sets per session. Do __3-4__ sessions per day. ? ? ? ?Abduction / Adduction (Active) ? ? ? ?With hand flat on table, spread all fingers apart, then bring them together as close as possible. Focus on bringing little finger back to ring finger ?Repeat __10__ times. Do _3-4___ sessions per day. ? ?1. Grip Strengthening (Resistive Putty) ? ? ?Squeeze putty using thumb and all fingers. ?Repeat _20___ times. Do __2__ sessions per day. ? ? ? ? ? ? ?

## 2021-07-21 NOTE — Telephone Encounter (Signed)
Please call pt to follow up on this message.  ? ?States she needs to know what to do. Informed pt that assistant will call @ earliest convenience.  ?

## 2021-07-22 DIAGNOSIS — E119 Type 2 diabetes mellitus without complications: Secondary | ICD-10-CM | POA: Diagnosis not present

## 2021-07-22 NOTE — Assessment & Plan Note (Signed)
Still having significant pain from this and doing OT within the last week started has not seen progress yet.  ?

## 2021-07-22 NOTE — Assessment & Plan Note (Signed)
She has multiple doses of her synthroid on her medication list and it is unclear which she is taking. She has been asked many times to bring most current medication list as many of her specialists are not in our system. She thinks taking 175 mcg daily. She is confused as her endocrinologist increased her dose after last TSH 0.3 and she did not think this was right. We will check TSH and free T4 and adjust as needed if levels out of range.  ?

## 2021-07-22 NOTE — Assessment & Plan Note (Signed)
Requires shower stool. We discussed that her most recent HgA1c is over controlled. She is seeing endocrinology and will ask them about decreasing her medications. She is having some low sugar episodes. She struggles with this due to prior weight loss surgery and being unable to eat a lot.  ?

## 2021-07-24 ENCOUNTER — Encounter (HOSPITAL_BASED_OUTPATIENT_CLINIC_OR_DEPARTMENT_OTHER): Payer: Self-pay | Admitting: Emergency Medicine

## 2021-07-24 ENCOUNTER — Other Ambulatory Visit: Payer: Self-pay

## 2021-07-24 ENCOUNTER — Emergency Department (HOSPITAL_BASED_OUTPATIENT_CLINIC_OR_DEPARTMENT_OTHER)
Admission: EM | Admit: 2021-07-24 | Discharge: 2021-07-24 | Disposition: A | Discharge status: Home / Self Care | Payer: Medicare Other | Attending: Emergency Medicine | Admitting: Emergency Medicine

## 2021-07-24 ENCOUNTER — Emergency Department (HOSPITAL_BASED_OUTPATIENT_CLINIC_OR_DEPARTMENT_OTHER): Payer: Medicare Other

## 2021-07-24 DIAGNOSIS — M79642 Pain in left hand: Secondary | ICD-10-CM | POA: Insufficient documentation

## 2021-07-24 DIAGNOSIS — M79645 Pain in left finger(s): Secondary | ICD-10-CM | POA: Diagnosis not present

## 2021-07-24 DIAGNOSIS — R519 Headache, unspecified: Secondary | ICD-10-CM | POA: Insufficient documentation

## 2021-07-24 DIAGNOSIS — Z794 Long term (current) use of insulin: Secondary | ICD-10-CM | POA: Diagnosis not present

## 2021-07-24 MED ORDER — KETOROLAC TROMETHAMINE 60 MG/2ML IM SOLN
60.0000 mg | Freq: Once | INTRAMUSCULAR | Status: AC
  Administered 2021-07-24: 60 mg via INTRAMUSCULAR
  Filled 2021-07-24: qty 2

## 2021-07-24 MED ORDER — DEXAMETHASONE SODIUM PHOSPHATE 10 MG/ML IJ SOLN
10.0000 mg | Freq: Once | INTRAMUSCULAR | Status: AC
Start: 2021-07-24 — End: 2021-07-24
  Administered 2021-07-24: 10 mg via INTRAMUSCULAR
  Filled 2021-07-24: qty 1

## 2021-07-24 MED ORDER — DIPHENHYDRAMINE HCL 25 MG PO CAPS
25.0000 mg | ORAL_CAPSULE | Freq: Once | ORAL | Status: AC
Start: 2021-07-24 — End: 2021-07-24
  Administered 2021-07-24: 25 mg via ORAL
  Filled 2021-07-24: qty 1

## 2021-07-24 MED ORDER — METOCLOPRAMIDE HCL 10 MG PO TABS
10.0000 mg | ORAL_TABLET | Freq: Once | ORAL | Status: AC
Start: 2021-07-24 — End: 2021-07-24
  Administered 2021-07-24: 10 mg via ORAL
  Filled 2021-07-24: qty 1

## 2021-07-24 MED ORDER — METOCLOPRAMIDE HCL 10 MG PO TABS: 10.0000 mg | ORAL_TABLET | Freq: Every evening | ORAL | 0 refills | Status: DC | PRN
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
## 2021-07-24 NOTE — ED Notes (Signed)
Dc instructions reviewed with patient. Patient voiced understanding. Dc with belongings.  °

## 2021-07-24 NOTE — ED Triage Notes (Signed)
Pt returns for headaches and left pinky finger pain. ?

## 2021-07-25 ENCOUNTER — Ambulatory Visit (INDEPENDENT_AMBULATORY_CARE_PROVIDER_SITE_OTHER): Payer: Medicare Other

## 2021-07-25 ENCOUNTER — Ambulatory Visit (INDEPENDENT_AMBULATORY_CARE_PROVIDER_SITE_OTHER): Payer: Medicare Other | Admitting: Orthopedic Surgery

## 2021-07-25 ENCOUNTER — Encounter: Payer: Self-pay | Admitting: Orthopedic Surgery

## 2021-07-25 DIAGNOSIS — M25572 Pain in left ankle and joints of left foot: Secondary | ICD-10-CM

## 2021-07-25 DIAGNOSIS — E1140 Type 2 diabetes mellitus with diabetic neuropathy, unspecified: Secondary | ICD-10-CM | POA: Diagnosis not present

## 2021-07-25 DIAGNOSIS — D508 Other iron deficiency anemias: Secondary | ICD-10-CM | POA: Diagnosis not present

## 2021-07-25 DIAGNOSIS — E039 Hypothyroidism, unspecified: Secondary | ICD-10-CM | POA: Diagnosis not present
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
## 2021-07-26 ENCOUNTER — Telehealth: Payer: Self-pay | Admitting: Internal Medicine

## 2021-07-26 NOTE — Telephone Encounter (Signed)
[facility] is requesting office notes to back up the request for the incontinent supplies.   ?

## 2021-07-27 NOTE — Telephone Encounter (Signed)
Last discussed 10/01/20 ?

## 2021-07-28 ENCOUNTER — Ambulatory Visit: Payer: Medicare Other | Admitting: Occupational Therapy

## 2021-07-28 ENCOUNTER — Encounter: Payer: Self-pay | Admitting: Occupational Therapy

## 2021-07-28 DIAGNOSIS — R278 Other lack of coordination: Secondary | ICD-10-CM | POA: Diagnosis not present

## 2021-07-28 DIAGNOSIS — M79642 Pain in left hand: Secondary | ICD-10-CM

## 2021-07-28 DIAGNOSIS — M25642 Stiffness of left hand, not elsewhere classified: Secondary | ICD-10-CM

## 2021-07-28 DIAGNOSIS — M6281 Muscle weakness (generalized): Secondary | ICD-10-CM | POA: Diagnosis not present
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
## 2021-07-28 NOTE — Telephone Encounter (Signed)
Office notes have been faxed to [facility]. Confirmation fax has been received.  ?

## 2021-07-29 ENCOUNTER — Ambulatory Visit (INDEPENDENT_AMBULATORY_CARE_PROVIDER_SITE_OTHER): Payer: Medicare Other | Admitting: Internal Medicine

## 2021-07-29 ENCOUNTER — Encounter: Payer: Self-pay | Admitting: Internal Medicine

## 2021-07-29 VITALS — BP 126/80 | HR 81 | Resp 18 | Ht 66.0 in | Wt 169.2 lb

## 2021-07-29 DIAGNOSIS — G7000 Myasthenia gravis without (acute) exacerbation: Secondary | ICD-10-CM

## 2021-07-29 DIAGNOSIS — G43909 Migraine, unspecified, not intractable, without status migrainosus: Secondary | ICD-10-CM | POA: Diagnosis not present

## 2021-07-29 DIAGNOSIS — N3941 Urge incontinence: Secondary | ICD-10-CM

## 2021-07-29 DIAGNOSIS — J4530 Mild persistent asthma, uncomplicated: Secondary | ICD-10-CM | POA: Diagnosis not present

## 2021-07-29 DIAGNOSIS — S63639A Sprain of interphalangeal joint of unspecified finger, initial encounter: Secondary | ICD-10-CM

## 2021-07-29 DIAGNOSIS — R2689 Other abnormalities of gait and mobility: Secondary | ICD-10-CM | POA: Diagnosis not present

## 2021-07-29 DIAGNOSIS — F1120 Opioid dependence, uncomplicated: Secondary | ICD-10-CM

## 2021-07-29 MED ORDER — ALBUTEROL SULFATE (2.5 MG/3ML) 0.083% IN NEBU: 2.5000 mg | INHALATION_SOLUTION | RESPIRATORY_TRACT | 2 refills | Status: DC | PRN

## 2021-07-29 MED ORDER — ALBUTEROL SULFATE HFA 108 (90 BASE) MCG/ACT IN AERS: 2.0000 | INHALATION_SPRAY | Freq: Four times a day (QID) | RESPIRATORY_TRACT | 1 refills | Status: DC | PRN

## 2021-07-29 NOTE — Assessment & Plan Note (Signed)
She does have stable, chronic incontinence. She does require incontinence supplies. Gets refill yearly from our office. Given her multiple medications from multiple providers and her uncertain medication list (she does not bring list of current medications and not all of her prescribers are in our EMR/communicate with our EMR and she does not know current medications) I would feel uncomfortable prescribing her medication for this. Is she desires future intervention we could consider referral to urogyn.  ?

## 2021-07-29 NOTE — Assessment & Plan Note (Signed)
She does still continue to have pain here and is doing OT. Advised her to continue with this. ?
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
## 2021-07-29 NOTE — Patient Instructions (Addendum)
We will get you in for the physical therapy for balance. ? ?We will have you check the dose of the topamax when you get home and call us or send [place] a message on mychart so we can see if there is room to increase the dose. ? ? ?

## 2021-07-29 NOTE — Progress Notes (Signed)
? ?  Subjective:  ? ?Patient ID: Jade Mathis, female    DOB: 25-Feb-1976, 46 y.o.   MRN: [id number] ? ?HPI ?The patient is a 46 YO female coming in for multiple concerns.  ? ?Review of Systems  ?Constitutional:  Positive for activity change. Negative for appetite change, chills, fatigue, fever and unexpected weight change.  ?Respiratory: Negative.    ?Cardiovascular: Negative.   ?Gastrointestinal: Negative.   ?Genitourinary:  Positive for enuresis.  ?     Incontinence  ?Musculoskeletal:  Positive for arthralgias, back pain and myalgias. Negative for gait problem and joint swelling.  ?Skin: Negative.   ?Neurological:  Positive for headaches.  ? ?Objective:  ?Physical Exam ?Constitutional:   ?   Appearance: Normal appearance.  ?HENT:  ?   Head: Normocephalic.  ?Cardiovascular:  ?   Rate and Rhythm: Normal rate and regular rhythm.  ?Pulmonary:  ?   Effort: Pulmonary effort is normal.  ?Musculoskeletal:     ?   General: Tenderness present. Normal range of motion.  ?Skin: ?   General: Skin is warm and dry.  ?Neurological:  ?   General: No focal deficit present.  ?   Mental Status: She is alert and oriented to person, place, and time.  ?   Coordination: Coordination abnormal.  ? ? ?Vitals:  ? 07/29/21 0818  ?BP: 126/80  ?Pulse: 81  ?Resp: 18  ?SpO2: 99%  ?Weight: 169 lb 3.2 oz (76.7 kg)  ?Height: '5\' 6"'$  (1.676 m)  ? ? ?Assessment & Plan:  ? ?

## 2021-07-29 NOTE — Assessment & Plan Note (Signed)
This does cause balance problems and weakness for her. She needs referral to PT for neuro rehab for balance and strength training.  ?

## 2021-07-29 NOTE — Assessment & Plan Note (Signed)
There is some concern that her chronic daily opioid could be contributing to her headache frequency with analgesic rebound.  ?

## 2021-07-29 NOTE — Assessment & Plan Note (Addendum)
She is having moderate episode flare of migraines over the last few months. She is uncontrolled having daily headaches of varying intensity. Using her chronic pain medication which does not help well. She is requesting adjustment to migraine medications today. Previously she has had a neurologist managing her migraines given her myasthenia gravis her medication options can be more limited. She was unable to tell us current doses of medications and thinks she was listing all of her current medications which were topamax and gabapentin. She thinks she was tried on aimovig but had reaction to that. She also mentions that her neurologist left the group about or more than a year ago and she has not seen anyone since that time. Will refer to neurology locally for migraines given her other medical conditions she would benefit from specialist. She will call us with current topamax dosing and if we can titrate up we will do this in the meantime.  ?

## 2021-08-01 ENCOUNTER — Telehealth: Payer: Self-pay

## 2021-08-01 ENCOUNTER — Encounter: Payer: Self-pay | Admitting: Occupational Therapy

## 2021-08-01 ENCOUNTER — Ambulatory Visit: Payer: Medicare Other | Admitting: Occupational Therapy

## 2021-08-01 DIAGNOSIS — E1140 Type 2 diabetes mellitus with diabetic neuropathy, unspecified: Secondary | ICD-10-CM | POA: Diagnosis not present

## 2021-08-01 DIAGNOSIS — R278 Other lack of coordination: Secondary | ICD-10-CM

## 2021-08-01 DIAGNOSIS — M79642 Pain in left hand: Secondary | ICD-10-CM

## 2021-08-01 DIAGNOSIS — M6281 Muscle weakness (generalized): Secondary | ICD-10-CM | POA: Diagnosis not present

## 2021-08-01 DIAGNOSIS — M25642 Stiffness of left hand, not elsewhere classified: Secondary | ICD-10-CM | POA: Diagnosis not present

## 2021-08-01 DIAGNOSIS — E039 Hypothyroidism, unspecified: Secondary | ICD-10-CM | POA: Diagnosis not present

## 2021-08-01 DIAGNOSIS — E559 Vitamin D deficiency, unspecified: Secondary | ICD-10-CM | POA: Diagnosis not present

## 2021-08-01 DIAGNOSIS — D508 Other iron deficiency anemias: Secondary | ICD-10-CM | POA: Diagnosis not present

## 2021-08-01 NOTE — Telephone Encounter (Signed)
Casts released from fabrication hold ? ?Shoes ordered - [employer] [place] Blue 60M ?
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
## 2021-08-02 ENCOUNTER — Telehealth: Payer: Self-pay | Admitting: Internal Medicine

## 2021-08-02 DIAGNOSIS — H9120 Sudden idiopathic hearing loss, unspecified ear: Secondary | ICD-10-CM
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
## 2021-08-03 DIAGNOSIS — K9049 Malabsorption due to intolerance, not elsewhere classified: Secondary | ICD-10-CM | POA: Diagnosis not present

## 2021-08-03 DIAGNOSIS — E1140 Type 2 diabetes mellitus with diabetic neuropathy, unspecified: Secondary | ICD-10-CM | POA: Diagnosis not present

## 2021-08-03 DIAGNOSIS — D508 Other iron deficiency anemias: Secondary | ICD-10-CM | POA: Diagnosis not present

## 2021-08-04 ENCOUNTER — Encounter: Payer: Medicare Other | Admitting: Occupational Therapy

## 2021-08-04 DIAGNOSIS — M545 Low back pain, unspecified: Secondary | ICD-10-CM | POA: Diagnosis not present

## 2021-08-04 DIAGNOSIS — G8929 Other chronic pain: Secondary | ICD-10-CM | POA: Diagnosis not present

## 2021-08-04 DIAGNOSIS — G43111 Migraine with aura, intractable, with status migrainosus: Secondary | ICD-10-CM | POA: Diagnosis not present

## 2021-08-04 DIAGNOSIS — Z79899 Other long term (current) drug therapy: Secondary | ICD-10-CM | POA: Diagnosis not present

## 2021-08-04 NOTE — Telephone Encounter (Signed)
Referral placed.

## 2021-08-11 ENCOUNTER — Ambulatory Visit: Payer: Medicare Other | Admitting: Occupational Therapy

## 2021-08-11 ENCOUNTER — Ambulatory Visit (INDEPENDENT_AMBULATORY_CARE_PROVIDER_SITE_OTHER): Payer: Medicare Other | Admitting: Orthopedic Surgery

## 2021-08-11 ENCOUNTER — Encounter: Payer: Self-pay | Admitting: Orthopedic Surgery

## 2021-08-11 DIAGNOSIS — R278 Other lack of coordination: Secondary | ICD-10-CM | POA: Diagnosis not present

## 2021-08-11 DIAGNOSIS — S63639A Sprain of interphalangeal joint of unspecified finger, initial encounter: Secondary | ICD-10-CM

## 2021-08-11 DIAGNOSIS — M6281 Muscle weakness (generalized): Secondary | ICD-10-CM | POA: Diagnosis not present

## 2021-08-11 DIAGNOSIS — S63637A Sprain of interphalangeal joint of left little finger, initial encounter: Secondary | ICD-10-CM | POA: Diagnosis not present

## 2021-08-11 DIAGNOSIS — M25642 Stiffness of left hand, not elsewhere classified: Secondary | ICD-10-CM | POA: Diagnosis not present

## 2021-08-11 DIAGNOSIS — M79642 Pain in left hand: Secondary | ICD-10-CM | POA: Diagnosis not present

## 2021-08-11 DIAGNOSIS — Z79899 Other long term (current) drug therapy: Secondary | ICD-10-CM | POA: Diagnosis not present
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
## 2021-08-16 ENCOUNTER — Ambulatory Visit: Payer: Medicare Other
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
## 2021-08-18 ENCOUNTER — Encounter: Payer: Self-pay | Admitting: Family Medicine

## 2021-08-18 ENCOUNTER — Ambulatory Visit: Payer: Medicare Other | Attending: Orthopedic Surgery | Admitting: Occupational Therapy

## 2021-08-18 ENCOUNTER — Ambulatory Visit (INDEPENDENT_AMBULATORY_CARE_PROVIDER_SITE_OTHER): Payer: Medicare Other | Admitting: Orthopedic Surgery

## 2021-08-18 ENCOUNTER — Ambulatory Visit (INDEPENDENT_AMBULATORY_CARE_PROVIDER_SITE_OTHER): Payer: Medicare Other | Admitting: Family Medicine

## 2021-08-18 VITALS — BP 102/72 | HR 52 | Ht 66.0 in | Wt 160.0 lb

## 2021-08-18 DIAGNOSIS — M25642 Stiffness of left hand, not elsewhere classified: Secondary | ICD-10-CM | POA: Diagnosis not present

## 2021-08-18 DIAGNOSIS — R2689 Other abnormalities of gait and mobility: Secondary | ICD-10-CM | POA: Insufficient documentation

## 2021-08-18 DIAGNOSIS — S0990XA Unspecified injury of head, initial encounter: Secondary | ICD-10-CM | POA: Diagnosis not present

## 2021-08-18 DIAGNOSIS — R2681 Unsteadiness on feet: Secondary | ICD-10-CM | POA: Insufficient documentation

## 2021-08-18 DIAGNOSIS — R278 Other lack of coordination: Secondary | ICD-10-CM | POA: Diagnosis not present

## 2021-08-18 DIAGNOSIS — G7000 Myasthenia gravis without (acute) exacerbation: Secondary | ICD-10-CM | POA: Diagnosis not present

## 2021-08-18 DIAGNOSIS — M79642 Pain in left hand: Secondary | ICD-10-CM | POA: Diagnosis not present

## 2021-08-18 DIAGNOSIS — M25572 Pain in left ankle and joints of left foot: Secondary | ICD-10-CM

## 2021-08-18 DIAGNOSIS — M542 Cervicalgia: Secondary | ICD-10-CM

## 2021-08-18 DIAGNOSIS — M6281 Muscle weakness (generalized): Secondary | ICD-10-CM | POA: Insufficient documentation

## 2021-08-18 DIAGNOSIS — R519 Headache, unspecified: Secondary | ICD-10-CM | POA: Diagnosis not present

## 2021-08-18 MED ORDER — METHYLPREDNISOLONE ACETATE 40 MG/ML IJ SUSP
40.0000 mg | Freq: Once | INTRAMUSCULAR | Status: AC
  Administered 2021-08-18: 40 mg via INTRAMUSCULAR
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
## 2021-08-18 NOTE — Assessment & Plan Note (Signed)
Questionable flare. Increase in prednisone for short-term.  Was given a shot of Depo-Medrol.  The patient's A1c has been.  No worsening symptoms need to seek medical attention immediately

## 2021-08-18 NOTE — Patient Instructions (Addendum)
  MRI brain MRI cervical [phone] If symptoms worsen, please seek medical care at the emergency room

## 2021-08-19 ENCOUNTER — Encounter: Payer: Self-pay | Admitting: Orthopedic Surgery

## 2021-08-19 DIAGNOSIS — K219 Gastro-esophageal reflux disease without esophagitis: Secondary | ICD-10-CM | POA: Diagnosis not present

## 2021-08-19 DIAGNOSIS — K589 Irritable bowel syndrome without diarrhea: Secondary | ICD-10-CM | POA: Diagnosis not present

## 2021-08-19 DIAGNOSIS — E119 Type 2 diabetes mellitus without complications: Secondary | ICD-10-CM | POA: Diagnosis not present
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
## 2021-08-23 DIAGNOSIS — E119 Type 2 diabetes mellitus without complications: Secondary | ICD-10-CM | POA: Diagnosis not present

## 2021-08-23 DIAGNOSIS — E039 Hypothyroidism, unspecified: Secondary | ICD-10-CM | POA: Diagnosis not present

## 2021-08-23 DIAGNOSIS — E559 Vitamin D deficiency, unspecified: Secondary | ICD-10-CM | POA: Diagnosis not present

## 2021-08-23 DIAGNOSIS — E785 Hyperlipidemia, unspecified: Secondary | ICD-10-CM | POA: Diagnosis not present

## 2021-08-23 DIAGNOSIS — E611 Iron deficiency: Secondary | ICD-10-CM | POA: Diagnosis not present

## 2021-08-24 ENCOUNTER — Ambulatory Visit: Payer: Medicare Other

## 2021-08-24 DIAGNOSIS — M2041 Other hammer toe(s) (acquired), right foot: Secondary | ICD-10-CM

## 2021-08-24 DIAGNOSIS — E1169 Type 2 diabetes mellitus with other specified complication: Secondary | ICD-10-CM

## 2021-08-24 DIAGNOSIS — M2010 Hallux valgus (acquired), unspecified foot: Secondary | ICD-10-CM

## 2021-08-24 NOTE — Progress Notes (Signed)
SITUATION Reason for Visit: Fitting of Diabetic Shoes & Insoles Patient / Caregiver Report:  Patient is satisfied with fit and function of shoes and insoles.  OBJECTIVE DATA: Patient History / Diagnosis:     ICD-10-CM   1. Diabetes mellitus type 2 in obese (HCC)  E11.69    E66.9     2. Hav (hallux abducto valgus), unspecified laterality  M20.10     3. Acquired hammer toes of both feet  M20.41    M20.42       Change in Status:   None  ACTIONS PERFORMED: In-Person Delivery, patient was fit with: - 1x pair [id number] PDAC approved prefabricated Diabetic Shoes: [employer] [place] - 3x pair [phone] PDAC approved vacuum formed custom diabetic insoles; [employer]:   Shoes and insoles were verified for structural integrity and safety. Patient wore shoes and insoles in office. Skin was inspected and free of areas of concern after wearing shoes and inserts. Shoes and inserts fit properly. Patient / Caregiver provided with ferbal instruction and demonstration regarding donning, doffing, wear, care, proper fit, function, purpose, cleaning, and use of shoes and insoles ' and in all related precautions and risks and benefits regarding shoes and insoles. Patient / Caregiver was instructed to wear properly fitting socks with shoes at all times. Patient was also provided with verbal instruction regarding how to report any failures or malfunctions of shoes or inserts, and necessary follow up care. Patient / Caregiver was also instructed to contact physician regarding change in status that may affect function of shoes and inserts.   Patient / Caregiver verbalized undersatnding of instruction provided. Patient / Caregiver demonstrated independence with proper donning and doffing of shoes and inserts.  PLAN Patient to follow with treating physician as recommended. Plan of care was discussed with and agreed upon by patient and/or caregiver. All questions were answered and concerns addressed.

## 2021-08-25 ENCOUNTER — Ambulatory Visit: Payer: Medicare Other | Admitting: Physical Therapy

## 2021-08-25 ENCOUNTER — Ambulatory Visit: Payer: Medicare Other | Admitting: Occupational Therapy

## 2021-08-25 DIAGNOSIS — R2689 Other abnormalities of gait and mobility: Secondary | ICD-10-CM

## 2021-08-25 DIAGNOSIS — R9431 Abnormal electrocardiogram [ECG] [EKG]: Secondary | ICD-10-CM | POA: Diagnosis not present

## 2021-08-25 DIAGNOSIS — K219 Gastro-esophageal reflux disease without esophagitis: Secondary | ICD-10-CM | POA: Diagnosis not present

## 2021-08-25 DIAGNOSIS — Z885 Allergy status to narcotic agent status: Secondary | ICD-10-CM | POA: Diagnosis not present

## 2021-08-25 DIAGNOSIS — Z794 Long term (current) use of insulin: Secondary | ICD-10-CM | POA: Diagnosis not present

## 2021-08-25 DIAGNOSIS — E039 Hypothyroidism, unspecified: Secondary | ICD-10-CM | POA: Diagnosis not present

## 2021-08-25 DIAGNOSIS — M797 Fibromyalgia: Secondary | ICD-10-CM | POA: Diagnosis not present

## 2021-08-25 DIAGNOSIS — J45909 Unspecified asthma, uncomplicated: Secondary | ICD-10-CM | POA: Diagnosis not present

## 2021-08-25 DIAGNOSIS — Z8249 Family history of ischemic heart disease and other diseases of the circulatory system: Secondary | ICD-10-CM | POA: Diagnosis not present

## 2021-08-25 DIAGNOSIS — Z7951 Long term (current) use of inhaled steroids: Secondary | ICD-10-CM | POA: Diagnosis not present

## 2021-08-25 DIAGNOSIS — Z833 Family history of diabetes mellitus: Secondary | ICD-10-CM | POA: Diagnosis not present

## 2021-08-25 DIAGNOSIS — G7000 Myasthenia gravis without (acute) exacerbation: Secondary | ICD-10-CM | POA: Diagnosis not present

## 2021-08-25 DIAGNOSIS — G500 Trigeminal neuralgia: Secondary | ICD-10-CM | POA: Diagnosis not present

## 2021-08-25 DIAGNOSIS — Z7984 Long term (current) use of oral hypoglycemic drugs: Secondary | ICD-10-CM | POA: Diagnosis not present

## 2021-08-25 DIAGNOSIS — Z888 Allergy status to other drugs, medicaments and biological substances status: Secondary | ICD-10-CM | POA: Diagnosis not present

## 2021-08-25 DIAGNOSIS — I959 Hypotension, unspecified: Secondary | ICD-10-CM | POA: Diagnosis not present

## 2021-08-25 DIAGNOSIS — R2681 Unsteadiness on feet: Secondary | ICD-10-CM

## 2021-08-25 DIAGNOSIS — Z9884 Bariatric surgery status: Secondary | ICD-10-CM | POA: Diagnosis not present

## 2021-08-25 DIAGNOSIS — E0500 Thyrotoxicosis with diffuse goiter without thyrotoxic crisis or storm: Secondary | ICD-10-CM | POA: Diagnosis not present

## 2021-08-25 DIAGNOSIS — G43909 Migraine, unspecified, not intractable, without status migrainosus: Secondary | ICD-10-CM | POA: Diagnosis not present

## 2021-08-25 DIAGNOSIS — G4730 Sleep apnea, unspecified: Secondary | ICD-10-CM | POA: Diagnosis not present

## 2021-08-25 DIAGNOSIS — E119 Type 2 diabetes mellitus without complications: Secondary | ICD-10-CM | POA: Diagnosis not present

## 2021-08-25 DIAGNOSIS — I10 Essential (primary) hypertension: Secondary | ICD-10-CM | POA: Diagnosis not present

## 2021-08-25 DIAGNOSIS — Z79899 Other long term (current) drug therapy: Secondary | ICD-10-CM | POA: Diagnosis not present

## 2021-08-25 DIAGNOSIS — G7001 Myasthenia gravis with (acute) exacerbation: Secondary | ICD-10-CM | POA: Diagnosis not present

## 2021-08-25 DIAGNOSIS — M6281 Muscle weakness (generalized): Secondary | ICD-10-CM
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
## 2021-08-26 ENCOUNTER — Encounter: Payer: Self-pay | Admitting: Occupational Therapy

## 2021-08-26 ENCOUNTER — Inpatient Hospital Stay (HOSPITAL_BASED_OUTPATIENT_CLINIC_OR_DEPARTMENT_OTHER)
Admission: EM | Admit: 2021-08-26 | Discharge: 2021-08-30 | DRG: 057 | Disposition: A | Discharge status: Home / Self Care | Payer: Medicare Other | Attending: Family Medicine | Admitting: Family Medicine

## 2021-08-26 ENCOUNTER — Ambulatory Visit: Payer: Medicare Other | Admitting: Occupational Therapy

## 2021-08-26 ENCOUNTER — Encounter: Payer: Self-pay | Admitting: Physical Therapy

## 2021-08-26 ENCOUNTER — Inpatient Hospital Stay (HOSPITAL_COMMUNITY): Payer: Medicare Other

## 2021-08-26 ENCOUNTER — Other Ambulatory Visit: Payer: Self-pay

## 2021-08-26 ENCOUNTER — Encounter (HOSPITAL_BASED_OUTPATIENT_CLINIC_OR_DEPARTMENT_OTHER): Payer: Self-pay | Admitting: Emergency Medicine

## 2021-08-26 DIAGNOSIS — Z833 Family history of diabetes mellitus: Secondary | ICD-10-CM | POA: Diagnosis not present

## 2021-08-26 DIAGNOSIS — R531 Weakness: Secondary | ICD-10-CM | POA: Diagnosis not present

## 2021-08-26 DIAGNOSIS — Z888 Allergy status to other drugs, medicaments and biological substances status: Secondary | ICD-10-CM

## 2021-08-26 DIAGNOSIS — M797 Fibromyalgia: Secondary | ICD-10-CM | POA: Diagnosis not present

## 2021-08-26 DIAGNOSIS — G4730 Sleep apnea, unspecified: Secondary | ICD-10-CM | POA: Diagnosis present

## 2021-08-26 DIAGNOSIS — F319 Bipolar disorder, unspecified: Secondary | ICD-10-CM | POA: Diagnosis present

## 2021-08-26 DIAGNOSIS — K219 Gastro-esophageal reflux disease without esophagitis: Secondary | ICD-10-CM | POA: Diagnosis not present

## 2021-08-26 DIAGNOSIS — Z794 Long term (current) use of insulin: Secondary | ICD-10-CM

## 2021-08-26 DIAGNOSIS — Z818 Family history of other mental and behavioral disorders: Secondary | ICD-10-CM | POA: Diagnosis not present

## 2021-08-26 DIAGNOSIS — Z7984 Long term (current) use of oral hypoglycemic drugs: Secondary | ICD-10-CM

## 2021-08-26 DIAGNOSIS — G7000 Myasthenia gravis without (acute) exacerbation: Secondary | ICD-10-CM | POA: Diagnosis not present

## 2021-08-26 DIAGNOSIS — Z7951 Long term (current) use of inhaled steroids: Secondary | ICD-10-CM | POA: Diagnosis not present

## 2021-08-26 DIAGNOSIS — G7001 Myasthenia gravis with (acute) exacerbation: Principal | ICD-10-CM | POA: Diagnosis present

## 2021-08-26 DIAGNOSIS — Z8249 Family history of ischemic heart disease and other diseases of the circulatory system: Secondary | ICD-10-CM

## 2021-08-26 DIAGNOSIS — E0500 Thyrotoxicosis with diffuse goiter without thyrotoxic crisis or storm: Secondary | ICD-10-CM | POA: Diagnosis not present

## 2021-08-26 DIAGNOSIS — G500 Trigeminal neuralgia: Secondary | ICD-10-CM | POA: Diagnosis present

## 2021-08-26 DIAGNOSIS — M79642 Pain in left hand: Secondary | ICD-10-CM

## 2021-08-26 DIAGNOSIS — G43909 Migraine, unspecified, not intractable, without status migrainosus: Secondary | ICD-10-CM | POA: Diagnosis not present

## 2021-08-26 DIAGNOSIS — I959 Hypotension, unspecified: Secondary | ICD-10-CM | POA: Diagnosis not present

## 2021-08-26 DIAGNOSIS — J45909 Unspecified asthma, uncomplicated: Secondary | ICD-10-CM | POA: Diagnosis not present

## 2021-08-26 DIAGNOSIS — Z79899 Other long term (current) drug therapy: Secondary | ICD-10-CM

## 2021-08-26 DIAGNOSIS — Z9884 Bariatric surgery status: Secondary | ICD-10-CM

## 2021-08-26 DIAGNOSIS — I10 Essential (primary) hypertension: Secondary | ICD-10-CM | POA: Diagnosis present

## 2021-08-26 DIAGNOSIS — E119 Type 2 diabetes mellitus without complications: Secondary | ICD-10-CM | POA: Diagnosis present

## 2021-08-26 DIAGNOSIS — Z885 Allergy status to narcotic agent status: Secondary | ICD-10-CM

## 2021-08-26 DIAGNOSIS — E039 Hypothyroidism, unspecified: Secondary | ICD-10-CM | POA: Diagnosis not present

## 2021-08-26 DIAGNOSIS — Z79890 Hormone replacement therapy (postmenopausal): Secondary | ICD-10-CM

## 2021-08-26 DIAGNOSIS — R519 Headache, unspecified: Secondary | ICD-10-CM | POA: Diagnosis not present

## 2021-08-26 DIAGNOSIS — H532 Diplopia: Secondary | ICD-10-CM | POA: Diagnosis present

## 2021-08-26 DIAGNOSIS — G8929 Other chronic pain: Secondary | ICD-10-CM | POA: Diagnosis present

## 2021-08-26 DIAGNOSIS — F419 Anxiety disorder, unspecified: Secondary | ICD-10-CM | POA: Diagnosis present

## 2021-08-26 DIAGNOSIS — M25642 Stiffness of left hand, not elsewhere classified: Secondary | ICD-10-CM

## 2021-08-26 DIAGNOSIS — M6281 Muscle weakness (generalized): Secondary | ICD-10-CM

## 2021-08-26 DIAGNOSIS — E785 Hyperlipidemia, unspecified: Secondary | ICD-10-CM | POA: Diagnosis present

## 2021-08-26 DIAGNOSIS — R278 Other lack of coordination: Secondary | ICD-10-CM

## 2021-08-26 DIAGNOSIS — R9431 Abnormal electrocardiogram [ECG] [EKG]: Secondary | ICD-10-CM | POA: Diagnosis not present

## 2021-08-26 LAB — CBG MONITORING, ED: Glucose-Capillary: 63 mg/dL — ABNORMAL LOW (ref 70–99)

## 2021-08-26 LAB — CBC
HCT: 38.4 % (ref 36.0–46.0)
Hemoglobin: 11.8 g/dL — ABNORMAL LOW (ref 12.0–15.0)
MCH: 26.4 pg (ref 26.0–34.0)
MCHC: 30.7 g/dL (ref 30.0–36.0)
MCV: 85.9 fL (ref 80.0–100.0)
Platelets: 216 10*3/uL (ref 150–400)
RBC: 4.47 MIL/uL (ref 3.87–5.11)
RDW: 12.8 % (ref 11.5–15.5)
WBC: 4.9 10*3/uL (ref 4.0–10.5)
nRBC: 0 % (ref 0.0–0.2)

## 2021-08-26 LAB — TSH: TSH: 0.334 u[IU]/mL — ABNORMAL LOW (ref 0.350–4.500)

## 2021-08-26 LAB — URINALYSIS, ROUTINE W REFLEX MICROSCOPIC
Bilirubin Urine: NEGATIVE
Glucose, UA: >1000 mg/dL — AB
Hgb urine dipstick: NEGATIVE
Ketones, ur: NEGATIVE mg/dL
Leukocytes,Ua: NEGATIVE
Nitrite: NEGATIVE
Protein, ur: NEGATIVE mg/dL
Specific Gravity, Urine: 1.013 (ref 1.005–1.030)
pH: 7.5 (ref 5.0–8.0)

## 2021-08-26 LAB — T4, FREE: Free T4: 0.83 ng/dL (ref 0.61–1.12)

## 2021-08-26 LAB — BASIC METABOLIC PANEL
Anion gap: 7 (ref 5–15)
BUN: 17 mg/dL (ref 6–20)
CO2: 27 mmol/L (ref 22–32)
Calcium: 9.5 mg/dL (ref 8.9–10.3)
Chloride: 103 mmol/L (ref 98–111)
Creatinine, Ser: 0.59 mg/dL (ref 0.44–1.00)
GFR, Estimated: >60 mL/min (ref >60)
Glucose, Bld: 88 mg/dL (ref 70–99)
Potassium: 3.4 mmol/L — ABNORMAL LOW (ref 3.5–5.1)
Sodium: 137 mmol/L (ref 135–145)

## 2021-08-26 MED ORDER — METOCLOPRAMIDE HCL 5 MG/ML IJ SOLN
10.0000 mg | Freq: Once | INTRAMUSCULAR | Status: AC
  Administered 2021-08-26: 10 mg via INTRAVENOUS
  Filled 2021-08-26: qty 2

## 2021-08-26 MED ORDER — AZATHIOPRINE 50 MG PO TABS: 150.0000 mg | ORAL_TABLET | Freq: Every morning | ORAL | Status: DC

## 2021-08-26 MED ORDER — SEMAGLUTIDE (1 MG/DOSE) 2 MG/1.5ML ~~LOC~~ SOPN: 0.0500 mg | PEN_INJECTOR | SUBCUTANEOUS | Status: DC

## 2021-08-26 MED ORDER — VALACYCLOVIR HCL 500 MG PO TABS
500.0000 mg | ORAL_TABLET | Freq: Two times a day (BID) | ORAL | Status: DC | PRN
  Filled 2021-08-26: qty 1

## 2021-08-26 MED ORDER — TRAZODONE HCL 50 MG PO TABS
100.0000 mg | ORAL_TABLET | Freq: Every day | ORAL | Status: DC
  Administered 2021-08-27: 100 mg via ORAL
  Filled 2021-08-26: qty 2

## 2021-08-26 MED ORDER — ENOXAPARIN SODIUM 40 MG/0.4ML IJ SOSY
40.0000 mg | PREFILLED_SYRINGE | INTRAMUSCULAR | Status: DC
  Administered 2021-08-26 – 2021-08-29 (×4): 40 mg via SUBCUTANEOUS
  Filled 2021-08-26 (×4): qty 0.4

## 2021-08-26 MED ORDER — IBUPROFEN 400 MG PO TABS
800.0000 mg | ORAL_TABLET | Freq: Three times a day (TID) | ORAL | Status: DC | PRN
Start: 2021-08-26 — End: 2021-08-30
  Administered 2021-08-29 – 2021-08-30 (×3): 800 mg via ORAL
  Filled 2021-08-26 (×3): qty 2

## 2021-08-26 MED ORDER — PROCHLORPERAZINE MALEATE 10 MG PO TABS: 10.0000 mg | ORAL_TABLET | Freq: Two times a day (BID) | ORAL | Status: DC | PRN

## 2021-08-26 MED ORDER — GABAPENTIN 100 MG PO CAPS
200.0000 mg | ORAL_CAPSULE | Freq: Three times a day (TID) | ORAL | Status: DC
  Administered 2021-08-26 – 2021-08-30 (×12): 200 mg via ORAL
  Filled 2021-08-26 (×12): qty 2

## 2021-08-26 MED ORDER — PRAZOSIN HCL 1 MG PO CAPS
1.0000 mg | ORAL_CAPSULE | Freq: Every day | ORAL | Status: DC
  Administered 2021-08-26: 1 mg via ORAL
  Filled 2021-08-26 (×2): qty 1

## 2021-08-26 MED ORDER — QUETIAPINE FUMARATE 100 MG PO TABS
400.0000 mg | ORAL_TABLET | Freq: Every day | ORAL | Status: DC
  Administered 2021-08-27: 400 mg via ORAL
  Filled 2021-08-26: qty 1
  Filled 2021-08-26: qty 4

## 2021-08-26 MED ORDER — LINACLOTIDE 145 MCG PO CAPS
290.0000 ug | ORAL_CAPSULE | Freq: Every day | ORAL | Status: DC
Start: 2021-08-27 — End: 2021-08-30
  Administered 2021-08-27 – 2021-08-30 (×4): 290 ug via ORAL
  Filled 2021-08-26 (×4): qty 2

## 2021-08-26 MED ORDER — KETOROLAC TROMETHAMINE 30 MG/ML IJ SOLN
30.0000 mg | Freq: Once | INTRAMUSCULAR | Status: AC
  Administered 2021-08-26: 30 mg via INTRAVENOUS
  Filled 2021-08-26: qty 1

## 2021-08-26 MED ORDER — HYDROXYZINE HCL 10 MG PO TABS
10.0000 mg | ORAL_TABLET | Freq: Three times a day (TID) | ORAL | Status: DC | PRN
  Administered 2021-08-27 – 2021-08-30 (×9): 10 mg via ORAL
  Filled 2021-08-26 (×12): qty 1

## 2021-08-26 MED ORDER — MOMETASONE FURO-FORMOTEROL FUM 100-5 MCG/ACT IN AERO: 2.0000 | INHALATION_SPRAY | Freq: Two times a day (BID) | RESPIRATORY_TRACT | Status: DC

## 2021-08-26 MED ORDER — TOPIRAMATE 25 MG PO TABS
200.0000 mg | ORAL_TABLET | Freq: Every day | ORAL | Status: DC
  Administered 2021-08-26 – 2021-08-30 (×5): 200 mg via ORAL
  Filled 2021-08-26 (×5): qty 8

## 2021-08-26 MED ORDER — ALPRAZOLAM 0.5 MG PO TABS
1.0000 mg | ORAL_TABLET | Freq: Three times a day (TID) | ORAL | Status: DC
  Administered 2021-08-26: 1 mg via ORAL
  Filled 2021-08-26: qty 4

## 2021-08-26 MED ORDER — AZATHIOPRINE 50 MG PO TABS
150.0000 mg | ORAL_TABLET | Freq: Every day | ORAL | Status: DC
  Administered 2021-08-27 – 2021-08-30 (×4): 150 mg via ORAL
  Filled 2021-08-26 (×4): qty 3

## 2021-08-26 MED ORDER — METFORMIN HCL ER 500 MG PO TB24
1000.0000 mg | ORAL_TABLET | Freq: Every day | ORAL | Status: DC
  Administered 2021-08-27 – 2021-08-29 (×3): 1000 mg via ORAL
  Filled 2021-08-26 (×3): qty 2

## 2021-08-26 MED ORDER — PREDNISONE 20 MG PO TABS: 10.0000 mg | ORAL_TABLET | Freq: Every day | ORAL | Status: DC

## 2021-08-26 MED ORDER — METOCLOPRAMIDE HCL 10 MG PO TABS: 10.0000 mg | ORAL_TABLET | Freq: Every evening | ORAL | Status: DC | PRN

## 2021-08-26 MED ORDER — LEVOTHYROXINE SODIUM 75 MCG PO TABS
175.0000 ug | ORAL_TABLET | ORAL | Status: DC
Start: 2021-08-27 — End: 2021-08-30
  Administered 2021-08-27 – 2021-08-30 (×4): 175 ug via ORAL
  Filled 2021-08-26 (×4): qty 1

## 2021-08-26 MED ORDER — ALBUTEROL SULFATE (2.5 MG/3ML) 0.083% IN NEBU
2.5000 mg | INHALATION_SOLUTION | Freq: Four times a day (QID) | RESPIRATORY_TRACT | Status: DC | PRN
Start: 2021-08-26 — End: 2021-08-30

## 2021-08-26 MED ORDER — OXYCODONE HCL 5 MG PO TABS
15.0000 mg | ORAL_TABLET | Freq: Four times a day (QID) | ORAL | Status: DC
  Administered 2021-08-27: 15 mg via ORAL
  Filled 2021-08-26 (×2): qty 3

## 2021-08-26 MED ORDER — ONDANSETRON 4 MG PO TBDP
4.0000 mg | ORAL_TABLET | Freq: Three times a day (TID) | ORAL | Status: DC | PRN
  Administered 2021-08-29: 4 mg via ORAL
  Filled 2021-08-26: qty 1

## 2021-08-26 MED ORDER — B COMPLEX PO TABS: 1.0000 | ORAL_TABLET | Freq: Every day | ORAL | Status: DC

## 2021-08-26 MED ORDER — PREDNISONE 5 MG PO TABS
10.0000 mg | ORAL_TABLET | Freq: Every day | ORAL | Status: DC
  Administered 2021-08-27 – 2021-08-30 (×4): 10 mg via ORAL
  Filled 2021-08-26 (×4): qty 2

## 2021-08-26 MED ORDER — PYRIDOSTIGMINE BROMIDE 60 MG PO TABS
60.0000 mg | ORAL_TABLET | Freq: Four times a day (QID) | ORAL | Status: DC
Start: 2021-08-26 — End: 2021-08-26

## 2021-08-26 MED ORDER — PYRIDOSTIGMINE BROMIDE 60 MG PO TABS
60.0000 mg | ORAL_TABLET | Freq: Four times a day (QID) | ORAL | Status: DC
  Administered 2021-08-26 – 2021-08-30 (×15): 60 mg via ORAL
  Filled 2021-08-26 (×17): qty 1

## 2021-08-26 MED ORDER — DULOXETINE HCL 60 MG PO CPEP
60.0000 mg | ORAL_CAPSULE | Freq: Every day | ORAL | Status: DC
  Administered 2021-08-26 – 2021-08-30 (×5): 60 mg via ORAL
  Filled 2021-08-26 (×5): qty 1

## 2021-08-26 MED ORDER — IOHEXOL 350 MG/ML SOLN
75.0000 mL | Freq: Once | INTRAVENOUS | Status: AC | PRN
Start: 2021-08-26 — End: 2021-08-26
  Administered 2021-08-26: 75 mL via INTRAVENOUS

## 2021-08-26 NOTE — ED Notes (Signed)
Patient transported to CT 
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
## 2021-08-26 NOTE — ED Notes (Signed)
RT Note: Obtained NIF/VC readings '@11'$ :20 this date with results as follows: NIF(-30)cmh20/VC(.75)lpm, both best of X3.

## 2021-08-26 NOTE — Progress Notes (Signed)
RT NOTE: RT obtained NIF/VC.  NIF -20 best of 3 attempts and VC 1.2 L .
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
## 2021-08-26 NOTE — ED Notes (Signed)
This RN saw low BP read on the monitor. Entered room to assess pt, pt resting on side, appearing to be asleep. Recheck of BP WNL.

## 2021-08-26 NOTE — ED Notes (Signed)
Pt stated that she did not feel well. Pt asked if she wanted her blood sugar checked. Pt's CBG result was 63. Informed Dr. Langston Masker. Dr. Langston Masker stated that the pt could have something to eat. Pt given peanut butter, graham crackers, and ginger ale.

## 2021-08-26 NOTE — ED Notes (Signed)
Pt transferred from draw bridge for an MRI.
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
## 2021-08-27 DIAGNOSIS — G7001 Myasthenia gravis with (acute) exacerbation: Secondary | ICD-10-CM | POA: Diagnosis not present

## 2021-08-27 LAB — CBC
HCT: 34.9 % — ABNORMAL LOW (ref 36.0–46.0)
Hemoglobin: 10.8 g/dL — ABNORMAL LOW (ref 12.0–15.0)
MCH: 26.7 pg (ref 26.0–34.0)
MCHC: 30.9 g/dL (ref 30.0–36.0)
MCV: 86.4 fL (ref 80.0–100.0)
Platelets: 196 10*3/uL (ref 150–400)
RBC: 4.04 MIL/uL (ref 3.87–5.11)
RDW: 12.7 % (ref 11.5–15.5)
WBC: 3.9 10*3/uL — ABNORMAL LOW (ref 4.0–10.5)
nRBC: 0 % (ref 0.0–0.2)

## 2021-08-27 LAB — COMPREHENSIVE METABOLIC PANEL
ALT: 11 U/L (ref 0–44)
AST: 11 U/L — ABNORMAL LOW (ref 15–41)
Albumin: 3.1 g/dL — ABNORMAL LOW (ref 3.5–5.0)
Alkaline Phosphatase: 48 U/L (ref 38–126)
Anion gap: 6 (ref 5–15)
BUN: 13 mg/dL (ref 6–20)
CO2: 24 mmol/L (ref 22–32)
Calcium: 8.5 mg/dL — ABNORMAL LOW (ref 8.9–10.3)
Chloride: 107 mmol/L (ref 98–111)
Creatinine, Ser: 0.58 mg/dL (ref 0.44–1.00)
GFR, Estimated: >60 mL/min (ref >60)
Glucose, Bld: 117 mg/dL — ABNORMAL HIGH (ref 70–99)
Potassium: 3.7 mmol/L (ref 3.5–5.1)
Sodium: 137 mmol/L (ref 135–145)
Total Bilirubin: 0.2 mg/dL — ABNORMAL LOW (ref 0.3–1.2)
Total Protein: 6 g/dL — ABNORMAL LOW (ref 6.5–8.1)

## 2021-08-27 MED ORDER — OXYCODONE HCL 5 MG PO TABS
5.0000 mg | ORAL_TABLET | Freq: Four times a day (QID) | ORAL | Status: DC | PRN
  Administered 2021-08-27 – 2021-08-30 (×9): 5 mg via ORAL
  Filled 2021-08-27 (×9): qty 1

## 2021-08-27 MED ORDER — LACTATED RINGERS IV BOLUS
1000.0000 mL | Freq: Once | INTRAVENOUS | Status: AC
  Administered 2021-08-27: 1000 mL via INTRAVENOUS

## 2021-08-27 MED ORDER — OXYCODONE HCL 5 MG PO TABS
10.0000 mg | ORAL_TABLET | Freq: Four times a day (QID) | ORAL | Status: DC | PRN
  Filled 2021-08-27: qty 2

## 2021-08-27 MED ORDER — SODIUM CHLORIDE 0.9 % IV BOLUS
500.0000 mL | Freq: Once | INTRAVENOUS | Status: AC
  Administered 2021-08-27: 500 mL via INTRAVENOUS

## 2021-08-27 MED ORDER — QUETIAPINE FUMARATE 100 MG PO TABS
200.0000 mg | ORAL_TABLET | Freq: Every day | ORAL | Status: DC
Start: 2021-08-27 — End: 2021-08-27

## 2021-08-27 MED ORDER — IMMUNE GLOBULIN (HUMAN) 10 GM/100ML IV SOLN
800.0000 mg/kg | INTRAVENOUS | Status: AC
  Administered 2021-08-28 – 2021-08-29 (×2): 60 g via INTRAVENOUS
  Filled 2021-08-27 (×2): qty 600

## 2021-08-27 MED ORDER — ALPRAZOLAM 0.5 MG PO TABS
0.2500 mg | ORAL_TABLET | Freq: Three times a day (TID) | ORAL | Status: DC | PRN
  Filled 2021-08-27: qty 1

## 2021-08-27 MED ORDER — ALPRAZOLAM 0.5 MG PO TABS
0.2500 mg | ORAL_TABLET | Freq: Three times a day (TID) | ORAL | Status: DC
Start: 2021-08-27 — End: 2021-08-27

## 2021-08-27 MED ORDER — SODIUM CHLORIDE 0.9 % IV BOLUS
250.0000 mL | Freq: Once | INTRAVENOUS | Status: AC
  Administered 2021-08-27: 250 mL via INTRAVENOUS

## 2021-08-27 MED ORDER — IMMUNE GLOBULIN (HUMAN) 10 GM/100ML IV SOLN
400.0000 mg/kg | INTRAVENOUS | Status: DC
  Administered 2021-08-27: 30 g via INTRAVENOUS
  Filled 2021-08-27: qty 300

## 2021-08-27 MED ORDER — QUETIAPINE FUMARATE 100 MG PO TABS
100.0000 mg | ORAL_TABLET | Freq: Every day | ORAL | Status: DC
  Administered 2021-08-27 – 2021-08-29 (×3): 100 mg via ORAL
  Filled 2021-08-27 (×3): qty 1

## 2021-08-27 NOTE — Progress Notes (Signed)
NIF of -40 VC of 1.6L  Both performed with good patient effort.  No complications noted.
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
## 2021-08-27 NOTE — Progress Notes (Signed)
Pt asleep on RT arrival.  Will check back to perform NIF/VC
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
## 2021-08-28 DIAGNOSIS — G7001 Myasthenia gravis with (acute) exacerbation: Secondary | ICD-10-CM | POA: Diagnosis not present
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
## 2021-08-28 NOTE — Progress Notes (Signed)
NIF -30, VC 1.5L with good patient effort.
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
## 2021-08-28 NOTE — Progress Notes (Signed)
Average of three attempts: NIF= -25 Vital Capacity= 1.40L Sp02=97% B/S= decreased/Clear
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
## 2021-08-29 ENCOUNTER — Other Ambulatory Visit: Payer: Medicare Other

## 2021-08-29 DIAGNOSIS — G7001 Myasthenia gravis with (acute) exacerbation: Secondary | ICD-10-CM | POA: Diagnosis not present

## 2021-08-29 LAB — CBC
HCT: 33.4 % — ABNORMAL LOW (ref 36.0–46.0)
Hemoglobin: 10.1 g/dL — ABNORMAL LOW (ref 12.0–15.0)
MCH: 26.4 pg (ref 26.0–34.0)
MCHC: 30.2 g/dL (ref 30.0–36.0)
MCV: 87.4 fL (ref 80.0–100.0)
Platelets: 179 10*3/uL (ref 150–400)
RBC: 3.82 MIL/uL — ABNORMAL LOW (ref 3.87–5.11)
RDW: 12.5 % (ref 11.5–15.5)
WBC: 3.8 10*3/uL — ABNORMAL LOW (ref 4.0–10.5)
nRBC: 0 % (ref 0.0–0.2)

## 2021-08-29 LAB — COMPREHENSIVE METABOLIC PANEL
ALT: 8 U/L (ref 0–44)
AST: 8 U/L — ABNORMAL LOW (ref 15–41)
Albumin: 2.6 g/dL — ABNORMAL LOW (ref 3.5–5.0)
Alkaline Phosphatase: 39 U/L (ref 38–126)
Anion gap: 3 — ABNORMAL LOW (ref 5–15)
BUN: 12 mg/dL (ref 6–20)
CO2: 22 mmol/L (ref 22–32)
Calcium: 8.3 mg/dL — ABNORMAL LOW (ref 8.9–10.3)
Chloride: 108 mmol/L (ref 98–111)
Creatinine, Ser: 0.55 mg/dL (ref 0.44–1.00)
GFR, Estimated: >60 mL/min (ref >60)
Glucose, Bld: 89 mg/dL (ref 70–99)
Potassium: 3.7 mmol/L (ref 3.5–5.1)
Sodium: 133 mmol/L — ABNORMAL LOW (ref 135–145)
Total Bilirubin: 0.4 mg/dL (ref 0.3–1.2)
Total Protein: 7.6 g/dL (ref 6.5–8.1)

## 2021-08-29 MED ORDER — LACTATED RINGERS IV BOLUS
500.0000 mL | Freq: Once | INTRAVENOUS | Status: AC
  Administered 2021-08-30: 500 mL via INTRAVENOUS

## 2021-08-29 NOTE — Progress Notes (Signed)
Nif = -22 VC =  1.6  liters O2 saturation 98 %

## 2021-08-29 NOTE — Plan of Care (Signed)
Neurology Plan of [place] MR# [id number] 08/29/2021  Patient was seen and examined.   States diplopia is improved. Able to count to 20 on one breath. Overall she is improving. Continue IVIg as planned.   Electronically signed by:  Lynnae Sandhoff, MD Page: [phone] 08/29/2021, 9:27 PM

## 2021-08-29 NOTE — Care Management Important Message (Signed)
Important Message  Patient Details  Name: Jade Mathis MRN: [id number] Date of Birth: Aug 19, 1975   Medicare Important Message Given:  No Patient refused to sign IM     Ryman Rathgeber [address] 08/29/2021, 3:43 PM
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
## 2021-08-29 NOTE — Progress Notes (Signed)
NIF -30 VC 1.5L  O2 99%

## 2021-08-30 ENCOUNTER — Ambulatory Visit: Payer: Medicare Other | Admitting: Physical Therapy

## 2021-08-30 ENCOUNTER — Encounter: Payer: Self-pay | Admitting: Family Medicine

## 2021-08-30 DIAGNOSIS — G7001 Myasthenia gravis with (acute) exacerbation: Secondary | ICD-10-CM | POA: Diagnosis not present

## 2021-08-30 MED ORDER — QUETIAPINE FUMARATE 100 MG PO TABS: 100.0000 mg | ORAL_TABLET | Freq: Every day | ORAL | 0 refills | Status: AC

## 2021-08-30 MED ORDER — GABAPENTIN 100 MG PO CAPS: 200.0000 mg | ORAL_CAPSULE | Freq: Three times a day (TID) | ORAL | 0 refills | Status: DC

## 2021-08-30 MED ORDER — PYRIDOSTIGMINE BROMIDE 60 MG PO TABS: 60.0000 mg | ORAL_TABLET | Freq: Four times a day (QID) | ORAL | 1 refills | Status: DC

## 2021-08-30 MED ORDER — OXYCODONE HCL 5 MG PO TABS: 5.0000 mg | ORAL_TABLET | Freq: Four times a day (QID) | ORAL | 0 refills | Status: DC | PRN

## 2021-08-30 NOTE — Progress Notes (Signed)
NIF = -24 cmh20 VC = 1.7 liters
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
## 2021-08-30 NOTE — Progress Notes (Signed)
Discharge paperwork reviewed with pt. Pt verbalized understanding. Pt alert and oriented x4 in no acute distress upon discharge. Pt husband on his way to pick up pt to transport her home for discharge. Pt has gathered all of her belongings.

## 2021-08-30 NOTE — Progress Notes (Signed)
   08/29/21 2230  Assess: MEWS Score  BP (!) 79/54  MAP (mmHg) (!) 63  Pulse Rate 74  ECG Heart Rate 74  Resp 15  SpO2 100 %  Assess: MEWS Score  MEWS Temp 0  MEWS Systolic 2  MEWS Pulse 0  MEWS RR 0  MEWS LOC 0  MEWS Score 2  MEWS Score Color Yellow  Assess: if the MEWS score is Yellow or Red  Were vital signs taken at a resting state? Yes  Focused Assessment No change from prior assessment  Does the patient meet 2 or more of the SIRS criteria? No  MEWS guidelines implemented *See Row Information* Yes  Treat  Pain Scale 0-10  Pain Score 0  Take Vital Signs  Increase Vital Sign Frequency  Yellow: Q 2hr X 2 then Q 4hr X 2, if remains yellow, continue Q 4hrs  Escalate  MEWS: Escalate Yellow: discuss with charge nurse/RN and consider discussing with provider and RRT  Notify: Charge Nurse/RN  Name of Charge Nurse/RN Notified Matt  Date Charge Nurse/RN Notified 08/30/21  Time Charge Nurse/RN Notified 0208  Notify: Provider  Provider Name/Title Dr. Jennette Kettle  Date Provider Notified 08/29/21  Time Provider Notified 2230  Method of Notification Page  Notification Reason Other (Comment) (Yellow MEWS)  Provider response See new orders  Date of Provider Response 08/29/21  Time of Provider Response 2230  Document  Patient Outcome Stabilized after interventions  Assess: SIRS CRITERIA  SIRS Temperature  0  SIRS Pulse 0  SIRS Respirations  0  SIRS WBC 0  SIRS Score Sum  0   Drop in blood pressure following PM meds. Patient arouses with light touch. Opens eyes, oriented x4.  Sheran Luz ordered 577m LR bolus with improvement of BP into 90s.
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
## 2021-08-30 NOTE — Progress Notes (Signed)
Patient is awake and oriented x4, ambulated to bathroom with stand-by assist & walker. Requesting Ibuprofen for headache.

## 2021-08-31 DIAGNOSIS — E1140 Type 2 diabetes mellitus with diabetic neuropathy, unspecified: Secondary | ICD-10-CM | POA: Diagnosis not present

## 2021-08-31 DIAGNOSIS — K9049 Malabsorption due to intolerance, not elsewhere classified: Secondary | ICD-10-CM | POA: Diagnosis not present

## 2021-09-01 ENCOUNTER — Ambulatory Visit: Payer: Medicare Other | Admitting: Physical Therapy

## 2021-09-05 DIAGNOSIS — G8929 Other chronic pain: Secondary | ICD-10-CM | POA: Diagnosis not present

## 2021-09-05 DIAGNOSIS — K5909 Other constipation: Secondary | ICD-10-CM | POA: Diagnosis not present

## 2021-09-05 DIAGNOSIS — Z79899 Other long term (current) drug therapy: Secondary | ICD-10-CM | POA: Diagnosis not present

## 2021-09-05 DIAGNOSIS — M545 Low back pain, unspecified: Secondary | ICD-10-CM | POA: Diagnosis not present

## 2021-09-06 ENCOUNTER — Ambulatory Visit (INDEPENDENT_AMBULATORY_CARE_PROVIDER_SITE_OTHER): Payer: Medicare Other | Admitting: Orthopedic Surgery

## 2021-09-06 ENCOUNTER — Encounter: Payer: Self-pay | Admitting: Orthopedic Surgery

## 2021-09-06 ENCOUNTER — Ambulatory Visit
Admission: RE | Admit: 2021-09-06 | Discharge: 2021-09-06 | Disposition: A | Discharge status: Home / Self Care | Payer: Self-pay | Source: Ambulatory Visit | Attending: Family Medicine | Admitting: Family Medicine

## 2021-09-06 ENCOUNTER — Other Ambulatory Visit: Payer: Medicare Other

## 2021-09-06 ENCOUNTER — Ambulatory Visit: Payer: Medicare Other | Admitting: Physical Therapy

## 2021-09-06 DIAGNOSIS — M25572 Pain in left ankle and joints of left foot: Secondary | ICD-10-CM

## 2021-09-06 DIAGNOSIS — R519 Headache, unspecified: Secondary | ICD-10-CM | POA: Diagnosis not present

## 2021-09-06 DIAGNOSIS — S199XXA Unspecified injury of neck, initial encounter: Secondary | ICD-10-CM | POA: Diagnosis not present

## 2021-09-06 DIAGNOSIS — M50223 Other cervical disc displacement at C6-C7 level: Secondary | ICD-10-CM | POA: Diagnosis not present

## 2021-09-06 DIAGNOSIS — M542 Cervicalgia: Secondary | ICD-10-CM

## 2021-09-06 MED ORDER — GADOBENATE DIMEGLUMINE 529 MG/ML IV SOLN
15.0000 mL | Freq: Once | INTRAVENOUS | Status: AC | PRN
  Administered 2021-09-06: 15 mL via INTRAVENOUS
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
## 2021-09-07 ENCOUNTER — Encounter: Payer: Self-pay | Admitting: Family Medicine
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
## 2021-09-08 ENCOUNTER — Ambulatory Visit (INDEPENDENT_AMBULATORY_CARE_PROVIDER_SITE_OTHER): Payer: Medicare Other | Admitting: Sports Medicine

## 2021-09-08 ENCOUNTER — Ambulatory Visit: Payer: Medicare Other | Admitting: Gastroenterology

## 2021-09-08 VITALS — BP 110/82 | HR 85 | Ht 66.0 in

## 2021-09-08 DIAGNOSIS — S0990XA Unspecified injury of head, initial encounter: Secondary | ICD-10-CM | POA: Diagnosis not present

## 2021-09-08 DIAGNOSIS — R519 Headache, unspecified: Secondary | ICD-10-CM | POA: Diagnosis not present

## 2021-09-08 DIAGNOSIS — M542 Cervicalgia: Secondary | ICD-10-CM | POA: Diagnosis not present

## 2021-09-08 DIAGNOSIS — G7000 Myasthenia gravis without (acute) exacerbation: Secondary | ICD-10-CM | POA: Diagnosis not present
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
## 2021-09-09 ENCOUNTER — Encounter: Payer: Self-pay | Admitting: Physical Therapy

## 2021-09-09 ENCOUNTER — Ambulatory Visit: Payer: Medicare Other

## 2021-09-09 ENCOUNTER — Ambulatory Visit: Payer: Medicare Other | Admitting: Physical Therapy

## 2021-09-09 ENCOUNTER — Telehealth: Payer: Self-pay | Admitting: Orthopedic Surgery

## 2021-09-09 DIAGNOSIS — M79642 Pain in left hand: Secondary | ICD-10-CM | POA: Diagnosis not present

## 2021-09-09 DIAGNOSIS — R2681 Unsteadiness on feet: Secondary | ICD-10-CM | POA: Diagnosis not present

## 2021-09-09 DIAGNOSIS — R2689 Other abnormalities of gait and mobility: Secondary | ICD-10-CM

## 2021-09-09 DIAGNOSIS — M6281 Muscle weakness (generalized): Secondary | ICD-10-CM | POA: Diagnosis not present

## 2021-09-09 DIAGNOSIS — M25572 Pain in left ankle and joints of left foot: Secondary | ICD-10-CM | POA: Diagnosis not present

## 2021-09-09 DIAGNOSIS — R278 Other lack of coordination: Secondary | ICD-10-CM | POA: Diagnosis not present

## 2021-09-09 DIAGNOSIS — M25642 Stiffness of left hand, not elsewhere classified: Secondary | ICD-10-CM | POA: Diagnosis not present

## 2021-09-09 NOTE — Telephone Encounter (Signed)
Tried to call pt and went straight to vm. The mail box is full and unable to leave message. Will hold and try again later.

## 2021-09-09 NOTE — Telephone Encounter (Signed)
Pt s/p ATFL sprain and has been treated with fx.  States that it is too heavy and wants a brace. Do you want to give a ASO or have pt wear OTC sleeve?

## 2021-09-09 NOTE — Progress Notes (Signed)
Pt was given ASO for the left ankle per Dr. Lajoyce Corners  Edgard Debord, RMA, [facility]

## 2021-09-09 NOTE — Telephone Encounter (Signed)
Pt called and advised front desk that she will be here in 30 min to pick up the brace

## 2021-09-12 ENCOUNTER — Ambulatory Visit (INDEPENDENT_AMBULATORY_CARE_PROVIDER_SITE_OTHER): Payer: Medicare Other | Admitting: Orthopedic Surgery

## 2021-09-12 DIAGNOSIS — S63639A Sprain of interphalangeal joint of unspecified finger, initial encounter: Secondary | ICD-10-CM

## 2021-09-12 DIAGNOSIS — S63637A Sprain of interphalangeal joint of left little finger, initial encounter: Secondary | ICD-10-CM | POA: Diagnosis not present

## 2021-09-13 ENCOUNTER — Other Ambulatory Visit: Payer: Medicare Other

## 2021-09-13 ENCOUNTER — Ambulatory Visit: Payer: Medicare Other | Admitting: Gastroenterology

## 2021-09-14 ENCOUNTER — Ambulatory Visit: Payer: Medicare Other | Admitting: Physical Therapy

## 2021-09-15 ENCOUNTER — Ambulatory Visit: Payer: Medicare Other | Admitting: Physical Therapy

## 2021-09-15 ENCOUNTER — Encounter: Payer: Self-pay | Admitting: Physical Therapy

## 2021-09-15 DIAGNOSIS — R2689 Other abnormalities of gait and mobility: Secondary | ICD-10-CM | POA: Diagnosis not present

## 2021-09-15 DIAGNOSIS — R2681 Unsteadiness on feet: Secondary | ICD-10-CM

## 2021-09-15 DIAGNOSIS — R278 Other lack of coordination: Secondary | ICD-10-CM | POA: Diagnosis not present

## 2021-09-15 DIAGNOSIS — M25642 Stiffness of left hand, not elsewhere classified: Secondary | ICD-10-CM | POA: Diagnosis not present

## 2021-09-15 DIAGNOSIS — M6281 Muscle weakness (generalized): Secondary | ICD-10-CM

## 2021-09-15 DIAGNOSIS — M79642 Pain in left hand: Secondary | ICD-10-CM | POA: Diagnosis not present
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
## 2021-09-16 ENCOUNTER — Ambulatory Visit: Payer: Medicare Other | Admitting: Physical Therapy

## 2021-09-16 ENCOUNTER — Encounter: Payer: Self-pay | Admitting: Family Medicine

## 2021-09-16 ENCOUNTER — Encounter: Payer: Self-pay | Admitting: Physical Therapy

## 2021-09-16 ENCOUNTER — Ambulatory Visit (INDEPENDENT_AMBULATORY_CARE_PROVIDER_SITE_OTHER): Payer: Medicare Other | Admitting: Family Medicine

## 2021-09-16 VITALS — BP 102/80 | HR 82 | Ht 66.0 in | Wt 172.0 lb

## 2021-09-16 DIAGNOSIS — M2550 Pain in unspecified joint: Secondary | ICD-10-CM

## 2021-09-16 DIAGNOSIS — R2689 Other abnormalities of gait and mobility: Secondary | ICD-10-CM | POA: Diagnosis not present

## 2021-09-16 DIAGNOSIS — S0990XA Unspecified injury of head, initial encounter: Secondary | ICD-10-CM

## 2021-09-16 DIAGNOSIS — R2681 Unsteadiness on feet: Secondary | ICD-10-CM | POA: Diagnosis not present

## 2021-09-16 DIAGNOSIS — R278 Other lack of coordination: Secondary | ICD-10-CM | POA: Diagnosis not present

## 2021-09-16 DIAGNOSIS — G7001 Myasthenia gravis with (acute) exacerbation: Secondary | ICD-10-CM

## 2021-09-16 DIAGNOSIS — M25642 Stiffness of left hand, not elsewhere classified: Secondary | ICD-10-CM | POA: Diagnosis not present

## 2021-09-16 DIAGNOSIS — M6281 Muscle weakness (generalized): Secondary | ICD-10-CM

## 2021-09-16 DIAGNOSIS — M79642 Pain in left hand: Secondary | ICD-10-CM | POA: Diagnosis not present

## 2021-09-16 LAB — CBC WITH DIFFERENTIAL/PLATELET
Basophils Absolute: 0 10*3/uL (ref 0.0–0.1)
Basophils Relative: 0.8 % (ref 0.0–3.0)
Eosinophils Absolute: 0.1 10*3/uL (ref 0.0–0.7)
Eosinophils Relative: 2.6 % (ref 0.0–5.0)
HCT: 37.3 % (ref 36.0–46.0)
Hemoglobin: 11.9 g/dL — ABNORMAL LOW (ref 12.0–15.0)
Lymphocytes Relative: 31.9 % (ref 12.0–46.0)
Lymphs Abs: 1 10*3/uL (ref 0.7–4.0)
MCHC: 31.8 g/dL (ref 30.0–36.0)
MCV: 82.6 fl (ref 78.0–100.0)
Monocytes Absolute: 0.2 10*3/uL (ref 0.1–1.0)
Monocytes Relative: 6.6 % (ref 3.0–12.0)
Neutro Abs: 1.9 10*3/uL (ref 1.4–7.7)
Neutrophils Relative %: 58.1 % (ref 43.0–77.0)
Platelets: 226 10*3/uL (ref 150.0–400.0)
RBC: 4.51 Mil/uL (ref 3.87–5.11)
RDW: 13 % (ref 11.5–15.5)
WBC: 3.3 10*3/uL — ABNORMAL LOW (ref 4.0–10.5)

## 2021-09-16 LAB — SEDIMENTATION RATE: Sed Rate: 56 mm/hr — ABNORMAL HIGH (ref 0–20)

## 2021-09-16 LAB — IBC PANEL
Iron: 69 ug/dL (ref 42–145)
Saturation Ratios: 24.3 % (ref 20.0–50.0)
TIBC: 284.2 ug/dL (ref 250.0–450.0)
Transferrin: 203 mg/dL — ABNORMAL LOW (ref 212.0–360.0)

## 2021-09-16 LAB — COMPREHENSIVE METABOLIC PANEL
ALT: 18 U/L (ref 0–35)
AST: 18 U/L (ref 0–37)
Albumin: 3.7 g/dL (ref 3.5–5.2)
Alkaline Phosphatase: 69 U/L (ref 39–117)
BUN: 18 mg/dL (ref 6–23)
CO2: 31 mEq/L (ref 19–32)
Calcium: 8.9 mg/dL (ref 8.4–10.5)
Chloride: 102 mEq/L (ref 96–112)
Creatinine, Ser: 0.62 mg/dL (ref 0.40–1.20)
GFR: 106.69 mL/min (ref >60.00)
Glucose, Bld: 119 mg/dL — ABNORMAL HIGH (ref 70–99)
Potassium: 3.3 mEq/L — ABNORMAL LOW (ref 3.5–5.1)
Sodium: 137 mEq/L (ref 135–145)
Total Bilirubin: 0.3 mg/dL (ref 0.2–1.2)
Total Protein: 8 g/dL (ref 6.0–8.3)

## 2021-09-16 LAB — VITAMIN D 25 HYDROXY (VIT D DEFICIENCY, FRACTURES): VITD: 92.51 ng/mL (ref 30.00–100.00)

## 2021-09-16 LAB — FERRITIN: Ferritin: 102.3 ng/mL (ref 10.0–291.0)

## 2021-09-16 LAB — TSH: TSH: 0.03 u[IU]/mL — ABNORMAL LOW (ref 0.35–5.50)

## 2021-09-16 LAB — URIC ACID: Uric Acid, Serum: 2.6 mg/dL (ref 2.4–7.0)

## 2021-09-16 MED ORDER — KETOROLAC TROMETHAMINE 30 MG/ML IJ SOLN
30.0000 mg | Freq: Once | INTRAMUSCULAR | Status: AC
  Administered 2021-09-16: 30 mg via INTRAMUSCULAR

## 2021-09-16 MED ORDER — METHYLPREDNISOLONE ACETATE 40 MG/ML IJ SUSP
40.0000 mg | Freq: Once | INTRAMUSCULAR | Status: AC
  Administered 2021-09-16: 40 mg via INTRAMUSCULAR
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
## 2021-09-16 NOTE — Patient Instructions (Addendum)
Injections in backside today Labs today If significant worsening, please go back to ED Try to make a follow up after Dr. Posey Pronto is back

## 2021-09-16 NOTE — Assessment & Plan Note (Signed)
Concerned the patient could be having an acute exacerbation of her myasthenia gravis.  Discussed with her that for treatment of this either by neurologist or her primary care provider needs to further evaluate.  Patient does have a history of diabetes as well and did want to avoid long-term prednisone if possible.  Patient given a Toradol and Depo-Medrol injection today to help.  Patient is on other medications such as Cymbalta and gabapentin that should be helping some of her symptoms.  Worsening symptoms over the holiday weekend though patient is to seek medical attention.
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
## 2021-09-17 LAB — PTH, INTACT AND CALCIUM
Calcium: 8.9 mg/dL (ref 8.6–10.2)
PTH: 61 pg/mL (ref 16–77)

## 2021-09-19 DIAGNOSIS — E119 Type 2 diabetes mellitus without complications: Secondary | ICD-10-CM | POA: Diagnosis not present

## 2021-09-19 DIAGNOSIS — K219 Gastro-esophageal reflux disease without esophagitis: Secondary | ICD-10-CM | POA: Diagnosis not present

## 2021-09-19 DIAGNOSIS — K589 Irritable bowel syndrome without diarrhea: Secondary | ICD-10-CM | POA: Diagnosis not present

## 2021-09-21 ENCOUNTER — Ambulatory Visit: Payer: Medicare Other | Admitting: Physical Therapy

## 2021-09-21 DIAGNOSIS — Z79891 Long term (current) use of opiate analgesic: Secondary | ICD-10-CM | POA: Diagnosis not present

## 2021-09-21 DIAGNOSIS — E611 Iron deficiency: Secondary | ICD-10-CM | POA: Diagnosis not present

## 2021-09-21 DIAGNOSIS — E559 Vitamin D deficiency, unspecified: Secondary | ICD-10-CM | POA: Diagnosis not present

## 2021-09-21 DIAGNOSIS — E039 Hypothyroidism, unspecified: Secondary | ICD-10-CM | POA: Diagnosis not present

## 2021-09-21 DIAGNOSIS — E785 Hyperlipidemia, unspecified: Secondary | ICD-10-CM | POA: Diagnosis not present

## 2021-09-21 DIAGNOSIS — E119 Type 2 diabetes mellitus without complications: Secondary | ICD-10-CM | POA: Diagnosis not present

## 2021-09-21 DIAGNOSIS — Z79899 Other long term (current) drug therapy: Secondary | ICD-10-CM | POA: Diagnosis not present

## 2021-09-23 DIAGNOSIS — Z79899 Other long term (current) drug therapy: Secondary | ICD-10-CM | POA: Diagnosis not present

## 2021-09-27 ENCOUNTER — Ambulatory Visit: Payer: Medicare Other | Admitting: Physical Therapy
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
## 2021-09-29 ENCOUNTER — Ambulatory Visit: Payer: Medicare Other | Attending: Orthopedic Surgery | Admitting: Physical Therapy

## 2021-09-29 ENCOUNTER — Ambulatory Visit (INDEPENDENT_AMBULATORY_CARE_PROVIDER_SITE_OTHER): Payer: Medicare Other | Admitting: Family Medicine

## 2021-09-29 ENCOUNTER — Encounter: Payer: Self-pay | Admitting: Physical Therapy

## 2021-09-29 ENCOUNTER — Encounter: Payer: Self-pay | Admitting: Family Medicine

## 2021-09-29 DIAGNOSIS — R2681 Unsteadiness on feet: Secondary | ICD-10-CM | POA: Insufficient documentation

## 2021-09-29 DIAGNOSIS — R2689 Other abnormalities of gait and mobility: Secondary | ICD-10-CM | POA: Diagnosis not present

## 2021-09-29 DIAGNOSIS — G7001 Myasthenia gravis with (acute) exacerbation: Secondary | ICD-10-CM

## 2021-09-29 DIAGNOSIS — M6281 Muscle weakness (generalized): Secondary | ICD-10-CM | POA: Insufficient documentation

## 2021-09-29 MED ORDER — KETOROLAC TROMETHAMINE 60 MG/2ML IM SOLN
60.0000 mg | Freq: Once | INTRAMUSCULAR | Status: AC
  Administered 2021-09-29: 60 mg via INTRAMUSCULAR

## 2021-09-29 MED ORDER — GABAPENTIN 300 MG PO CAPS: 300.0000 mg | ORAL_CAPSULE | Freq: Every day | ORAL | 0 refills | Status: DC

## 2021-09-29 MED ORDER — CELECOXIB 100 MG PO CAPS: 100.0000 mg | ORAL_CAPSULE | Freq: Two times a day (BID) | ORAL | 1 refills | Status: DC

## 2021-09-29 MED ORDER — METHYLPREDNISOLONE ACETATE 80 MG/ML IJ SUSP
80.0000 mg | Freq: Once | INTRAMUSCULAR | Status: AC
  Administered 2021-09-29: 80 mg via INTRAMUSCULAR

## 2021-09-29 NOTE — Patient Instructions (Signed)
See you again in 4-6 weeks We'll be discussing with Dr. Posey Pronto
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
## 2021-09-29 NOTE — Addendum Note (Signed)
Addended by: Pollyann Glen on: 09/29/2021 09:32 AM   Modules accepted: Orders

## 2021-09-29 NOTE — Assessment & Plan Note (Signed)
Still concerned the patient may be having more of an acute exacerbation.  Toradol and Depo-Medrol given again today.  Discussed which activities to do and which ones to avoid.  We will start with Celebrex to see if this will be beneficial.  Patient's most recent GFR was over 100.  Warned of any potential side effects.  In addition to this we will increase patient's gabapentin to 300 mg.  We are still awaiting the follow-up with neurology to see if there is a better treatment option for her at this time.  Patient knows if worsening headache or pain to seek medical attention immediately.  Can follow-up with me again in 6 weeks

## 2021-10-02 NOTE — Addendum Note (Signed)
Addended by: Lamar Benes on: 10/02/2021 07:56 PM   Modules accepted: Orders

## 2021-10-03 DIAGNOSIS — H93293 Other abnormal auditory perceptions, bilateral: Secondary | ICD-10-CM | POA: Diagnosis not present

## 2021-10-04 ENCOUNTER — Ambulatory Visit: Payer: Medicare Other | Admitting: Physical Therapy

## 2021-10-04 ENCOUNTER — Ambulatory Visit: Payer: Medicare Other | Admitting: Orthopedic Surgery

## 2021-10-04 DIAGNOSIS — Z79891 Long term (current) use of opiate analgesic: Secondary | ICD-10-CM | POA: Diagnosis not present

## 2021-10-04 DIAGNOSIS — G8929 Other chronic pain: Secondary | ICD-10-CM | POA: Diagnosis not present

## 2021-10-04 DIAGNOSIS — M545 Low back pain, unspecified: Secondary | ICD-10-CM | POA: Diagnosis not present

## 2021-10-04 DIAGNOSIS — Z79899 Other long term (current) drug therapy: Secondary | ICD-10-CM | POA: Diagnosis not present

## 2021-10-06 ENCOUNTER — Ambulatory Visit: Payer: Medicare Other | Admitting: Physical Therapy

## 2021-10-06 DIAGNOSIS — R2689 Other abnormalities of gait and mobility: Secondary | ICD-10-CM | POA: Diagnosis not present

## 2021-10-06 DIAGNOSIS — R2681 Unsteadiness on feet: Secondary | ICD-10-CM | POA: Diagnosis not present

## 2021-10-06 DIAGNOSIS — M6281 Muscle weakness (generalized): Secondary | ICD-10-CM

## 2021-10-06 DIAGNOSIS — Z79899 Other long term (current) drug therapy: Secondary | ICD-10-CM | POA: Diagnosis not present
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
## 2021-10-07 ENCOUNTER — Encounter: Payer: Self-pay | Admitting: Physical Therapy

## 2021-10-10 ENCOUNTER — Encounter: Payer: Self-pay | Admitting: Physical Therapy

## 2021-10-10 ENCOUNTER — Ambulatory Visit: Payer: Medicare Other | Admitting: Physical Therapy

## 2021-10-10 DIAGNOSIS — R2689 Other abnormalities of gait and mobility: Secondary | ICD-10-CM

## 2021-10-10 DIAGNOSIS — R2681 Unsteadiness on feet: Secondary | ICD-10-CM | POA: Diagnosis not present

## 2021-10-10 DIAGNOSIS — M6281 Muscle weakness (generalized): Secondary | ICD-10-CM | POA: Diagnosis not present
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
## 2021-10-12 ENCOUNTER — Ambulatory Visit (INDEPENDENT_AMBULATORY_CARE_PROVIDER_SITE_OTHER): Payer: Medicare Other

## 2021-10-12 ENCOUNTER — Telehealth: Payer: Self-pay | Admitting: Family

## 2021-10-12 ENCOUNTER — Telehealth: Payer: Self-pay | Admitting: Neurology

## 2021-10-12 ENCOUNTER — Ambulatory Visit (INDEPENDENT_AMBULATORY_CARE_PROVIDER_SITE_OTHER): Payer: Medicare Other | Admitting: Family

## 2021-10-12 ENCOUNTER — Encounter: Payer: Self-pay | Admitting: Family

## 2021-10-12 DIAGNOSIS — G8929 Other chronic pain: Secondary | ICD-10-CM | POA: Diagnosis not present

## 2021-10-12 DIAGNOSIS — M79605 Pain in left leg: Secondary | ICD-10-CM

## 2021-10-12 DIAGNOSIS — M25572 Pain in left ankle and joints of left foot: Secondary | ICD-10-CM | POA: Diagnosis not present

## 2021-10-12 MED ORDER — PREDNISONE 50 MG PO TABS: ORAL_TABLET | ORAL | 0 refills | Status: DC

## 2021-10-12 NOTE — Telephone Encounter (Signed)
Called pt and she states that she would like her dictation corrected to reflect that she has completed occupational therapy for her hand but that she is still going to physical therapy and has another 8 weeks left to complete going twice a week. Advised that she could pick up this dictation of her office visit on Friday.  Pt also asked if the MRI order had ben entered. I advised that yes we would submit this request to her insurance company and that once authorization has been obtained will call her to offer a day and time for the procedure and that she will follo wup after this

## 2021-10-12 NOTE — Telephone Encounter (Signed)
Called patient and she informed me that her Myasthenia Gravis has been worsening. She states that her chest has been tightening for 2 days now and states that it is part of her Myasthenia Gravis. She also reports that she has double vision. Patient has been taking prednisone 10 mg daily, azathrioprine '150mg'$  daily, and mestinon 60 mg four times daily. She states that Dr. Tamala Julian recommended she have infusions done to help with her Myasthenia. I informed patient that Dr. Posey Pronto is out on maternity leave but that I would send her message to the covering physicians.

## 2021-10-12 NOTE — Telephone Encounter (Signed)
Called patient and informed her of Dr. Amparo Bristol recommendations. Patient understands to go to the ER if she experiences any symptoms explained to her. Patient is aware that I will send a message to the front and have someone call her with an appt time. Patient did not have any questions and understood all explained to her.

## 2021-10-12 NOTE — Telephone Encounter (Signed)
If she is having any shortness of breath, difficulty swallowing/breathing or hold her head up, needs to go to ER. Otherwise, can put on Dr. Serita Grit schedule for Aug 4 f/u. Thanks

## 2021-10-12 NOTE — Telephone Encounter (Signed)
Pt called requesting a call back concerning a letter that needs to be corrected from [place]. Pt also states she need a call from Autumn F about another matter. Please call pt at [phone].
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
## 2021-10-13 ENCOUNTER — Telehealth: Payer: Self-pay | Admitting: Family

## 2021-10-13 ENCOUNTER — Encounter: Payer: Self-pay | Admitting: Physical Therapy

## 2021-10-13 ENCOUNTER — Other Ambulatory Visit: Payer: Self-pay | Admitting: Orthopedic Surgery

## 2021-10-13 ENCOUNTER — Telehealth: Payer: Self-pay | Admitting: Physical Therapy

## 2021-10-13 ENCOUNTER — Ambulatory Visit (INDEPENDENT_AMBULATORY_CARE_PROVIDER_SITE_OTHER): Payer: Medicare Other | Admitting: Internal Medicine

## 2021-10-13 ENCOUNTER — Encounter: Payer: Self-pay | Admitting: Internal Medicine

## 2021-10-13 ENCOUNTER — Ambulatory Visit: Payer: Medicare Other | Admitting: Physical Therapy

## 2021-10-13 VITALS — BP 124/72 | HR 90 | Resp 18 | Ht 66.0 in | Wt 164.0 lb

## 2021-10-13 DIAGNOSIS — N3941 Urge incontinence: Secondary | ICD-10-CM | POA: Diagnosis not present

## 2021-10-13 DIAGNOSIS — R2689 Other abnormalities of gait and mobility: Secondary | ICD-10-CM

## 2021-10-13 DIAGNOSIS — M6281 Muscle weakness (generalized): Secondary | ICD-10-CM

## 2021-10-13 DIAGNOSIS — E890 Postprocedural hypothyroidism: Secondary | ICD-10-CM | POA: Diagnosis not present

## 2021-10-13 DIAGNOSIS — G7000 Myasthenia gravis without (acute) exacerbation: Secondary | ICD-10-CM | POA: Diagnosis not present

## 2021-10-13 DIAGNOSIS — G43909 Migraine, unspecified, not intractable, without status migrainosus: Secondary | ICD-10-CM | POA: Diagnosis not present

## 2021-10-13 DIAGNOSIS — G8929 Other chronic pain: Secondary | ICD-10-CM

## 2021-10-13 DIAGNOSIS — R2681 Unsteadiness on feet: Secondary | ICD-10-CM

## 2021-10-13 LAB — CBC
HCT: 37.9 % (ref 36.0–46.0)
Hemoglobin: 12 g/dL (ref 12.0–15.0)
MCHC: 31.6 g/dL (ref 30.0–36.0)
MCV: 82.4 fl (ref 78.0–100.0)
Platelets: 241 10*3/uL (ref 150.0–400.0)
RBC: 4.59 Mil/uL (ref 3.87–5.11)
RDW: 13.2 % (ref 11.5–15.5)
WBC: 4 10*3/uL (ref 4.0–10.5)

## 2021-10-13 LAB — COMPREHENSIVE METABOLIC PANEL
ALT: 10 U/L (ref 0–35)
AST: 14 U/L (ref 0–37)
Albumin: 3.9 g/dL (ref 3.5–5.2)
Alkaline Phosphatase: 70 U/L (ref 39–117)
BUN: 10 mg/dL (ref 6–23)
CO2: 28 mEq/L (ref 19–32)
Calcium: 9.1 mg/dL (ref 8.4–10.5)
Chloride: 104 mEq/L (ref 96–112)
Creatinine, Ser: 0.61 mg/dL (ref 0.40–1.20)
GFR: 107.05 mL/min (ref >60.00)
Glucose, Bld: 99 mg/dL (ref 70–99)
Potassium: 3.6 mEq/L (ref 3.5–5.1)
Sodium: 138 mEq/L (ref 135–145)
Total Bilirubin: 0.4 mg/dL (ref 0.2–1.2)
Total Protein: 7.7 g/dL (ref 6.0–8.3)

## 2021-10-13 LAB — T4, FREE: Free T4: 0.98 ng/dL (ref 0.60–1.60)

## 2021-10-13 LAB — TSH: TSH: 0.19 u[IU]/mL — ABNORMAL LOW (ref 0.35–5.50)

## 2021-10-13 MED ORDER — AMLODIPINE BESYLATE 2.5 MG PO TABS: 2.5000 mg | ORAL_TABLET | Freq: Every day | ORAL | 1 refills | Status: DC
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
## 2021-10-13 NOTE — Telephone Encounter (Signed)
Referral placed for [facility] PT.

## 2021-10-13 NOTE — Telephone Encounter (Signed)
Called patient and moved up to 10/21/21 with Dr. Posey Pronto at 7:50 AM.   Cancelled appointment for 12/14/21.

## 2021-10-13 NOTE — Telephone Encounter (Signed)
Hello Dondra Prader, NP,  I was chart reviewing her chart and noted you note about PT. We are seeing her for her Myasthenia Gravis diagnosis and are not treating her ankle. I wanted to make you aware of this. Is there any restrictions we should know about with her ankle, ie keep the boot on at this time?  Thank you,  Willow Ora, PTA, [place] [address] [place], [place] [address] [phone] 10/13/21, 9:14 AM

## 2021-10-13 NOTE — Patient Instructions (Signed)
We have done the note for you which is on mychart.

## 2021-10-13 NOTE — Telephone Encounter (Signed)
Pt informed
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
## 2021-10-14 ENCOUNTER — Encounter: Payer: Self-pay | Admitting: Internal Medicine

## 2021-10-14 NOTE — Assessment & Plan Note (Signed)
Needs her incontinence supplies and asked her to check with company on status and have them fax [place] forms if this is needed.

## 2021-10-14 NOTE — Assessment & Plan Note (Signed)
Not clearly in crisis today. She does have some chest tightness symptoms which are not clearly related to asthma. She did get some relief from treatment in hospital and has follow up with neurology next week.

## 2021-10-17 ENCOUNTER — Encounter: Payer: Self-pay | Admitting: Physical Therapy

## 2021-10-17 ENCOUNTER — Ambulatory Visit: Payer: Medicare Other | Admitting: Physical Therapy

## 2021-10-17 VITALS — BP 109/68

## 2021-10-17 DIAGNOSIS — R2681 Unsteadiness on feet: Secondary | ICD-10-CM

## 2021-10-17 DIAGNOSIS — R2689 Other abnormalities of gait and mobility: Secondary | ICD-10-CM

## 2021-10-17 DIAGNOSIS — M6281 Muscle weakness (generalized): Secondary | ICD-10-CM
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
## 2021-10-18 DIAGNOSIS — K219 Gastro-esophageal reflux disease without esophagitis: Secondary | ICD-10-CM | POA: Diagnosis not present

## 2021-10-18 DIAGNOSIS — K589 Irritable bowel syndrome without diarrhea: Secondary | ICD-10-CM | POA: Diagnosis not present

## 2021-10-18 DIAGNOSIS — E119 Type 2 diabetes mellitus without complications: Secondary | ICD-10-CM | POA: Diagnosis not present

## 2021-10-20 ENCOUNTER — Encounter (HOSPITAL_BASED_OUTPATIENT_CLINIC_OR_DEPARTMENT_OTHER): Payer: Self-pay | Admitting: Emergency Medicine

## 2021-10-20 ENCOUNTER — Inpatient Hospital Stay (HOSPITAL_BASED_OUTPATIENT_CLINIC_OR_DEPARTMENT_OTHER)
Admission: EM | Admit: 2021-10-20 | Discharge: 2021-11-03 | DRG: 057 | Disposition: A | Discharge status: Home Health | Payer: Medicare Other | Attending: Internal Medicine | Admitting: Internal Medicine

## 2021-10-20 ENCOUNTER — Emergency Department (HOSPITAL_BASED_OUTPATIENT_CLINIC_OR_DEPARTMENT_OTHER): Payer: Medicare Other

## 2021-10-20 ENCOUNTER — Ambulatory Visit: Payer: Medicare Other | Attending: Orthopedic Surgery | Admitting: Physical Therapy

## 2021-10-20 ENCOUNTER — Telehealth: Payer: Self-pay | Admitting: Neurology

## 2021-10-20 ENCOUNTER — Encounter: Payer: Self-pay | Admitting: Physical Therapy

## 2021-10-20 ENCOUNTER — Encounter (HOSPITAL_COMMUNITY): Payer: Self-pay

## 2021-10-20 ENCOUNTER — Other Ambulatory Visit: Payer: Self-pay

## 2021-10-20 DIAGNOSIS — Z7952 Long term (current) use of systemic steroids: Secondary | ICD-10-CM

## 2021-10-20 DIAGNOSIS — E669 Obesity, unspecified: Secondary | ICD-10-CM

## 2021-10-20 DIAGNOSIS — R609 Edema, unspecified: Secondary | ICD-10-CM | POA: Diagnosis not present

## 2021-10-20 DIAGNOSIS — M797 Fibromyalgia: Secondary | ICD-10-CM | POA: Diagnosis not present

## 2021-10-20 DIAGNOSIS — Z9049 Acquired absence of other specified parts of digestive tract: Secondary | ICD-10-CM

## 2021-10-20 DIAGNOSIS — D508 Other iron deficiency anemias: Secondary | ICD-10-CM | POA: Diagnosis not present

## 2021-10-20 DIAGNOSIS — E119 Type 2 diabetes mellitus without complications: Secondary | ICD-10-CM | POA: Diagnosis not present

## 2021-10-20 DIAGNOSIS — I959 Hypotension, unspecified: Secondary | ICD-10-CM | POA: Diagnosis not present

## 2021-10-20 DIAGNOSIS — I10 Essential (primary) hypertension: Secondary | ICD-10-CM | POA: Diagnosis present

## 2021-10-20 DIAGNOSIS — Z818 Family history of other mental and behavioral disorders: Secondary | ICD-10-CM | POA: Diagnosis not present

## 2021-10-20 DIAGNOSIS — E1169 Type 2 diabetes mellitus with other specified complication: Secondary | ICD-10-CM

## 2021-10-20 DIAGNOSIS — F11929 Opioid use, unspecified with intoxication, unspecified: Secondary | ICD-10-CM | POA: Diagnosis not present

## 2021-10-20 DIAGNOSIS — R0789 Other chest pain: Secondary | ICD-10-CM | POA: Diagnosis not present

## 2021-10-20 DIAGNOSIS — J45909 Unspecified asthma, uncomplicated: Secondary | ICD-10-CM | POA: Diagnosis present

## 2021-10-20 DIAGNOSIS — Z833 Family history of diabetes mellitus: Secondary | ICD-10-CM | POA: Diagnosis not present

## 2021-10-20 DIAGNOSIS — R2689 Other abnormalities of gait and mobility: Secondary | ICD-10-CM | POA: Insufficient documentation

## 2021-10-20 DIAGNOSIS — Z801 Family history of malignant neoplasm of trachea, bronchus and lung: Secondary | ICD-10-CM

## 2021-10-20 DIAGNOSIS — Z20822 Contact with and (suspected) exposure to covid-19: Secondary | ICD-10-CM | POA: Diagnosis present

## 2021-10-20 DIAGNOSIS — E282 Polycystic ovarian syndrome: Secondary | ICD-10-CM | POA: Diagnosis not present

## 2021-10-20 DIAGNOSIS — D638 Anemia in other chronic diseases classified elsewhere: Secondary | ICD-10-CM | POA: Diagnosis present

## 2021-10-20 DIAGNOSIS — N179 Acute kidney failure, unspecified: Secondary | ICD-10-CM | POA: Diagnosis not present

## 2021-10-20 DIAGNOSIS — G4733 Obstructive sleep apnea (adult) (pediatric): Secondary | ICD-10-CM | POA: Diagnosis present

## 2021-10-20 DIAGNOSIS — R000 Tachycardia, unspecified: Secondary | ICD-10-CM | POA: Diagnosis not present

## 2021-10-20 DIAGNOSIS — N289 Disorder of kidney and ureter, unspecified: Secondary | ICD-10-CM

## 2021-10-20 DIAGNOSIS — Z888 Allergy status to other drugs, medicaments and biological substances status: Secondary | ICD-10-CM

## 2021-10-20 DIAGNOSIS — R600 Localized edema: Secondary | ICD-10-CM | POA: Diagnosis not present

## 2021-10-20 DIAGNOSIS — E1140 Type 2 diabetes mellitus with diabetic neuropathy, unspecified: Secondary | ICD-10-CM | POA: Diagnosis not present

## 2021-10-20 DIAGNOSIS — Z7984 Long term (current) use of oral hypoglycemic drugs: Secondary | ICD-10-CM

## 2021-10-20 DIAGNOSIS — F419 Anxiety disorder, unspecified: Secondary | ICD-10-CM | POA: Diagnosis present

## 2021-10-20 DIAGNOSIS — R1310 Dysphagia, unspecified: Secondary | ICD-10-CM | POA: Diagnosis present

## 2021-10-20 DIAGNOSIS — R531 Weakness: Principal | ICD-10-CM

## 2021-10-20 DIAGNOSIS — E039 Hypothyroidism, unspecified: Secondary | ICD-10-CM | POA: Diagnosis present

## 2021-10-20 DIAGNOSIS — R0602 Shortness of breath: Secondary | ICD-10-CM | POA: Diagnosis not present

## 2021-10-20 DIAGNOSIS — Z885 Allergy status to narcotic agent status: Secondary | ICD-10-CM

## 2021-10-20 DIAGNOSIS — K219 Gastro-esophageal reflux disease without esophagitis: Secondary | ICD-10-CM | POA: Diagnosis not present

## 2021-10-20 DIAGNOSIS — F319 Bipolar disorder, unspecified: Secondary | ICD-10-CM | POA: Diagnosis not present

## 2021-10-20 DIAGNOSIS — R5383 Other fatigue: Secondary | ICD-10-CM | POA: Diagnosis present

## 2021-10-20 DIAGNOSIS — Z832 Family history of diseases of the blood and blood-forming organs and certain disorders involving the immune mechanism: Secondary | ICD-10-CM

## 2021-10-20 DIAGNOSIS — Z810 Family history of intellectual disabilities: Secondary | ICD-10-CM

## 2021-10-20 DIAGNOSIS — Z8249 Family history of ischemic heart disease and other diseases of the circulatory system: Secondary | ICD-10-CM

## 2021-10-20 DIAGNOSIS — G7001 Myasthenia gravis with (acute) exacerbation: Principal | ICD-10-CM

## 2021-10-20 DIAGNOSIS — Z79899 Other long term (current) drug therapy: Secondary | ICD-10-CM

## 2021-10-20 DIAGNOSIS — Z79890 Hormone replacement therapy (postmenopausal): Secondary | ICD-10-CM

## 2021-10-20 LAB — CBC WITH DIFFERENTIAL/PLATELET
Abs Immature Granulocytes: 0.02 10*3/uL (ref 0.00–0.07)
Basophils Absolute: 0 10*3/uL (ref 0.0–0.1)
Basophils Relative: 0 %
Eosinophils Absolute: 0.3 10*3/uL (ref 0.0–0.5)
Eosinophils Relative: 5 %
HCT: 36.8 % (ref 36.0–46.0)
Hemoglobin: 11.4 g/dL — ABNORMAL LOW (ref 12.0–15.0)
Immature Granulocytes: 0 %
Lymphocytes Relative: 23 %
Lymphs Abs: 1.7 10*3/uL (ref 0.7–4.0)
MCH: 26.2 pg (ref 26.0–34.0)
MCHC: 31 g/dL (ref 30.0–36.0)
MCV: 84.6 fL (ref 80.0–100.0)
Monocytes Absolute: 0.6 10*3/uL (ref 0.1–1.0)
Monocytes Relative: 9 %
Neutro Abs: 4.5 10*3/uL (ref 1.7–7.7)
Neutrophils Relative %: 63 %
Platelets: 251 10*3/uL (ref 150–400)
RBC: 4.35 MIL/uL (ref 3.87–5.11)
RDW: 13 % (ref 11.5–15.5)
WBC: 7.2 10*3/uL (ref 4.0–10.5)
nRBC: 0 % (ref 0.0–0.2)

## 2021-10-20 LAB — COMPREHENSIVE METABOLIC PANEL
ALT: 8 U/L (ref 0–44)
AST: 11 U/L — ABNORMAL LOW (ref 15–41)
Albumin: 3.8 g/dL (ref 3.5–5.0)
Alkaline Phosphatase: 65 U/L (ref 38–126)
Anion gap: 8 (ref 5–15)
BUN: 15 mg/dL (ref 6–20)
CO2: 31 mmol/L (ref 22–32)
Calcium: 9.3 mg/dL (ref 8.9–10.3)
Chloride: 99 mmol/L (ref 98–111)
Creatinine, Ser: 0.62 mg/dL (ref 0.44–1.00)
GFR, Estimated: >60 mL/min (ref >60)
Glucose, Bld: 92 mg/dL (ref 70–99)
Potassium: 3.3 mmol/L — ABNORMAL LOW (ref 3.5–5.1)
Sodium: 138 mmol/L (ref 135–145)
Total Bilirubin: 0.3 mg/dL (ref 0.3–1.2)
Total Protein: 6.9 g/dL (ref 6.5–8.1)

## 2021-10-20 LAB — TROPONIN I (HIGH SENSITIVITY): Troponin I (High Sensitivity): <2 ng/L (ref <18)

## 2021-10-20 LAB — LIPASE, BLOOD: Lipase: 39 U/L (ref 11–51)

## 2021-10-20 MED ORDER — SODIUM CHLORIDE 0.9 % IV BOLUS
1000.0000 mL | Freq: Once | INTRAVENOUS | Status: AC
  Administered 2021-10-20: 1000 mL via INTRAVENOUS

## 2021-10-20 NOTE — Telephone Encounter (Signed)
Left message with after hour service on 10-20-21 at 12:40pm  Patient states that she feels weak all over and like she has a elephant on her chest going through a lot of stress. She wants Dr to get her set up for the infusion center does not want to go to the ED    Please call patient back

## 2021-10-20 NOTE — ED Notes (Signed)
Patient is a transfer from [facility] facility, patient has been transferred due to her MG flare up and to consult Neurology.

## 2021-10-20 NOTE — Telephone Encounter (Signed)
Called patient and informed her of Dr. Serita Grit recommendations and advice. Patient stated that "the elephant on her chest" is a symptom of Myasthenia Gravis because [employer] said so. Patient stated that she will go to the ER because Dr. Posey Pronto won't listen to her and the ER is the only ones that will listen to her because they will keep her. Informed patient that I will be sure to give the message to Dr. Posey Pronto.

## 2021-10-20 NOTE — Telephone Encounter (Signed)
Noted  

## 2021-10-20 NOTE — ED Notes (Signed)
Late entry -- This nurse to bedside to greet patient.  Pt awake and alert; GCS 15.  Weak voice - RR even and unlabored - O2 sats 97% on RA.  Pt does report ongoing sob with generalized chest pressure rated 8/10.  Pt tolerating PO intake without issue.  1L NS bolus now infusing to 20G R FA; dressing dry and intact.  UA pending; pt aware; had been on bedpan but unable to void.  Will monitor for acute changes and maintain plan of care as pt awaits dispo

## 2021-10-20 NOTE — ED Notes (Signed)
[facility] at bedside 

## 2021-10-20 NOTE — ED Notes (Signed)
Pt provided an assortment of snacks and ginger ale per request.

## 2021-10-20 NOTE — ED Notes (Signed)
Called [facility] to transport patient to [facility] emergency--Neurology wants to see her--Dr. Regenia Skeeter accepting

## 2021-10-20 NOTE — ED Triage Notes (Signed)
Pt has MG and has c/o weakness/chest pressure/short of breath. Pt has infusion scheduled for the am. MD wanted her seen in Ed for chest presure.

## 2021-10-20 NOTE — ED Notes (Signed)
NIF - 10; VC 550 best of two; minimal effort w/ maneuvers . Patient feels weak and short of breath.

## 2021-10-20 NOTE — ED Provider Notes (Signed)
[facility] EMERGENCY DEPT Provider Note   CSN: [id number] Arrival date & time: 10/20/21  1652     History  Chief Complaint  Patient presents with   Shortness of Breath    Jade Mathis is a 46 y.o. female.  Patient here with shortness of breath, chest pain, numbness.  She thinks maybe she is having a myasthenia gravis flare.  She has been under a lot of stress with health insurance people and try to get IVIG outpatient at times.  Supposedly she supposed to see her neurologist tomorrow.  Nothing makes it worse or better.  She denies any cough, sputum production.  She states that her energy levels have been really low.  Denies any headache, numbness, fever, chills.  The history is provided by the patient.       Home Medications Prior to Admission medications   Medication Sig Start Date End Date Taking? Authorizing Provider  albuterol (PROVENTIL) (2.5 MG/3ML) 0.083% nebulizer solution Take 3 mLs (2.5 mg total) by nebulization every 4 (four) hours as needed for wheezing or shortness of breath. 07/29/21   Hoyt Koch, MD  albuterol (VENTOLIN HFA) 108 (90 Base) MCG/ACT inhaler Inhale 2 puffs into the lungs every 6 (six) hours as needed for wheezing or shortness of breath. 07/29/21   Hoyt Koch, MD  amLODipine (NORVASC) 2.5 MG tablet Take 1 tablet (2.5 mg total) by mouth daily. 10/13/21   Hoyt Koch, MD  azaTHIOprine (IMURAN) 50 MG tablet Take 3 tablets (150 mg total) by mouth in the morning. 04/15/21   Narda Amber K, DO  b complex vitamins tablet Take 1 tablet by mouth daily.    [provider]  Blood Glucose Monitoring Suppl (ACCU-CHEK AVIVA PLUS) w/Device KIT Use to test blood sugar daily 01/24/19   Hoyt Koch, MD  celecoxib (CELEBREX) 100 MG capsule Take 1 capsule (100 mg total) by mouth 2 (two) times daily. 09/29/21   Lyndal Pulley, DO  cetirizine (ZYRTEC) 10 MG tablet TAKE 1 TABLET BY MOUTH EVERY DAY Patient  taking differently: Take 10 mg by mouth daily. 08/17/20   Hoyt Koch, MD  DULoxetine (CYMBALTA) 60 MG capsule Take 60 mg by mouth daily. 11/13/17   [provider]  ergocalciferol (VITAMIN D2) 1.25 MG (50000 UT) capsule Take 50,000 Units by mouth every Sunday.    [provider]  Fluticasone-Salmeterol (ADVAIR) 100-50 MCG/DOSE AEPB Inhale 1 puff into the lungs 2 (two) times daily. 08/16/20   Mikhail, Velta Addison, DO  gabapentin (NEURONTIN) 100 MG capsule Take 2 capsules (200 mg total) by mouth 3 (three) times daily. 08/30/21   Nita Sells, MD  gabapentin (NEURONTIN) 300 MG capsule Take 1 capsule (300 mg total) by mouth at bedtime. 09/29/21   Lyndal Pulley, DO  hydrOXYzine (ATARAX/VISTARIL) 10 MG tablet Take 10 mg by mouth 3 (three) times daily as needed for itching.    [provider]  ibuprofen (ADVIL) 800 MG tablet Take 800 mg by mouth every 8 (eight) hours as needed for mild pain or headache.    [provider]  Insulin Pen Needle 31G X 5 MM MISC Use daily with insulin pen 07/15/18   Elayne Snare, MD  JARDIANCE 25 MG TABS tablet Take 12.5 mg by mouth daily. 08/01/21   [provider]  levothyroxine (SYNTHROID) 175 MCG tablet Take 175 mcg by mouth daily. 05/26/21   [provider]  linaclotide (LINZESS) 290 MCG CAPS capsule Take 290 mcg by mouth daily  before breakfast.    [provider]  metFORMIN (GLUCOPHAGE-XR) 500 MG 24 hr tablet Take 1,000 mg by mouth at bedtime.    [provider]  ondansetron (ZOFRAN-ODT) 4 MG disintegrating tablet Take 1 tablet (4 mg total) by mouth every 8 (eight) hours as needed for nausea or vomiting. 11/29/20   Hoyt Koch, MD  OneTouch Delica Lancets 41L MISC Use twice a day 05/04/21   Hoyt Koch, MD  oxyCODONE (OXY IR/ROXICODONE) 5 MG immediate release tablet Take 1 tablet (5 mg total) by mouth every 6 (six) hours as needed for severe pain. 08/30/21   Nita Sells, MD  pantoprazole (PROTONIX) 40 MG tablet TAKE 1 TABLET BY MOUTH EVERY DAY Patient taking differently: Take 40 mg by mouth daily before breakfast. 10/04/18   Woodroe Mode, MD  predniSONE (DELTASONE) 10 MG tablet Take 1 tablet (10 mg total) by mouth daily. 04/15/21   Patel, Arvin Collard K, DO  predniSONE (DELTASONE) 50 MG tablet Take one tablet by mouth once daily for 5 days. 10/12/21   Suzan Slick, NP  promethazine (PHENERGAN) 12.5 MG tablet Take 1 tablet (12.5 mg total) by mouth every 8 (eight) hours as needed for nausea or vomiting. 11/29/20   Hoyt Koch, MD  pyridostigmine (MESTINON) 60 MG tablet Take 1 tablet (60 mg total) by mouth 4 (four) times daily. 08/30/21   Nita Sells, MD  QUEtiapine (SEROQUEL) 100 MG tablet Take 1 tablet (100 mg total) by mouth at bedtime. 08/30/21   Nita Sells, MD  Semaglutide (OZEMPIC, 1 MG/DOSE, [place]) Inject 0.05 mg into the skin every Sunday.    [provider]  topiramate (TOPAMAX) 200 MG tablet Take 200 mg by mouth daily in the afternoon.    [provider]  valACYclovir (VALTREX) 500 MG tablet Take 1 tablet (500 mg total) by mouth 2 (two) times daily. Patient taking differently: Take 500 mg by mouth 2 (two) times daily as needed (as directed for breakouts). 04/27/20   Hoyt Koch, MD      Allergies    Magnesium-containing compounds, Depo-provera [medroxyprogesterone], Hydrocodone-acetaminophen, Medroxyprogesterone acetate, Other, and Vicodin [hydrocodone-acetaminophen]    Review of Systems   Review of Systems  Physical Exam Updated Vital Signs BP 110/77   Pulse 84   Resp 15   LMP 10/13/2021   SpO2 100%  Physical Exam Vitals and nursing note reviewed.  Constitutional:      Appearance: She is well-developed. She is not ill-appearing.     Comments: Patient seems to be slightly anxious, depressed and withdrawn  HENT:     Head: Normocephalic and atraumatic.     Mouth/Throat:     Mouth:  Mucous membranes are moist.  Eyes:     Conjunctiva/sclera: Conjunctivae normal.     Pupils: Pupils are equal, round, and reactive to light.  Cardiovascular:     Rate and Rhythm: Normal rate and regular rhythm.     Pulses: Normal pulses.     Heart sounds: Normal heart sounds. No murmur heard. Pulmonary:     Effort: Pulmonary effort is normal. No respiratory distress.     Breath sounds: Normal breath sounds. No decreased breath sounds or wheezing.     Comments: Normal respiratory effort Abdominal:     Palpations: Abdomen is soft.     Tenderness: There is no abdominal tenderness.  Musculoskeletal:        General: No swelling.     Cervical back: Normal range of motion and neck supple.  Skin:    General: Skin is warm and dry.     Capillary Refill: Capillary refill takes less than 2 seconds.  Neurological:     General: No focal deficit present.     Mental Status: She is alert.  Psychiatric:        Mood and Affect: Mood normal.     ED Results / Procedures / Treatments   Labs (all labs ordered are listed, but only abnormal results are displayed) Labs Reviewed  CBC WITH DIFFERENTIAL/PLATELET - Abnormal; Notable for the following components:      Result Value   Hemoglobin 11.4 (*)    All other components within normal limits  COMPREHENSIVE METABOLIC PANEL - Abnormal; Notable for the following components:   Potassium 3.3 (*)    AST 11 (*)    All other components within normal limits  LIPASE, BLOOD  URINALYSIS, ROUTINE W REFLEX MICROSCOPIC  TROPONIN I (HIGH SENSITIVITY)    EKG EKG Interpretation  Date/Time:  Thursday October 20 2021 17:09:17 EDT Ventricular Rate:  101 PR Interval:  108 QRS Duration: 99 QT Interval:  339 QTC Calculation: 440 R Axis:   49 Text Interpretation: Sinus tachycardia Confirmed by Lennice Sites ([id number]) on 10/20/2021 5:50:17 PM  Radiology DG Chest Portable 1 View  Result Date: 10/20/2021 CLINICAL DATA:  Chest pressure and shortness of breath. EXAM:  PORTABLE CHEST 1 VIEW COMPARISON:  May 20, 2021 FINDINGS: Multiple sternal wires are noted. The heart size and mediastinal contours are within normal limits. Both lungs are clear. The visualized skeletal structures are unremarkable. IMPRESSION: No active cardiopulmonary disease. Electronically Signed   By: Virgina Norfolk M.D.   On: 10/20/2021 17:47    Procedures Procedures    Medications Ordered in ED Medications  sodium chloride 0.9 % bolus 1,000 mL (1,000 mLs Intravenous New Bag/Given 10/20/21 1923)    ED Course/ Medical Decision Making/ A&P                           Medical Decision Making Amount and/or Complexity of Data Reviewed Labs: ordered. Radiology: ordered.   Jade Mathis Is here with chest pain, shortness of breath, weakness.  History of myasthenia gravis.  History of bipolar, anxiety, diabetes.  Symptoms for the last several days.  Thinks that she needs IVIG more frequently that she gets it.  She supposed to see neurology tomorrow.  She denies any fevers or chills.  Overall she spent a lot of time telling me about the her stress working with health insurance.  She feels like she is not eating and drinking as much as she should.  She feels like she is not doing well in her physical therapy.  Denies any infectious symptoms.  Appears comfortable from a respiratory standpoint.  Tried to do vital capacity and neph and NIF and overall hard to tell if she did not really try or if she really has difficulty breathing.  Vital capacity was around 550, neph was around -15.  But not sure if this is effort dependent or not.  Right now I do not think there is any signs or respiratory compromise and will continue to evaluate for any infectious process and talk with neurology.  No significant findings on lab work per my review and interpretation.  No significant anemia, electrolyte abnormality, kidney injury, leukocytosis.  Chest x-ray negative for infection.  Troponin undetectable.   Overall, not sure if this is a myasthenia gravis flare or just some  other generalized process.  She seems a little bit withdrawn on exam and ultimately I talked with neurology Dr. Janna Arch who agrees to see her in consultation and will have her transferred ED to ED to [facility] for him to do an evaluation.   Dr. Regenia Skeeter with ED team is aware.  Please consult neurology upon patient arrival to ED.  This chart was dictated using voice recognition software.  Despite best efforts to proofread,  errors can occur which can change the documentation meaning.         Final Clinical Impression(s) / ED Diagnoses Final diagnoses:  Weakness    Rx / DC Orders ED Discharge Orders     None         Lennice Sites, DO 10/20/21 2005
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
## 2021-10-20 NOTE — Telephone Encounter (Signed)
She has an appointment tomorrow at 7:50a and will be evaluated for her weakness in the office.   Chest pressure "elephant on her chest" is not seen with myasthenia.  Best to be evaluated in the ER to better understand the nature of her chest discomfort.

## 2021-10-21 ENCOUNTER — Non-Acute Institutional Stay (HOSPITAL_COMMUNITY): Payer: Medicare Other

## 2021-10-21 ENCOUNTER — Non-Acute Institutional Stay (HOSPITAL_BASED_OUTPATIENT_CLINIC_OR_DEPARTMENT_OTHER): Payer: Medicare Other

## 2021-10-21 ENCOUNTER — Other Ambulatory Visit (HOSPITAL_COMMUNITY): Payer: Medicare Other

## 2021-10-21 ENCOUNTER — Encounter (HOSPITAL_COMMUNITY): Payer: Self-pay | Admitting: Neurology

## 2021-10-21 ENCOUNTER — Encounter: Payer: Self-pay | Admitting: Neurology

## 2021-10-21 ENCOUNTER — Ambulatory Visit: Payer: Medicare Other | Admitting: Neurology

## 2021-10-21 DIAGNOSIS — J45909 Unspecified asthma, uncomplicated: Secondary | ICD-10-CM | POA: Diagnosis present

## 2021-10-21 DIAGNOSIS — N179 Acute kidney failure, unspecified: Secondary | ICD-10-CM | POA: Diagnosis not present

## 2021-10-21 DIAGNOSIS — R609 Edema, unspecified: Secondary | ICD-10-CM

## 2021-10-21 DIAGNOSIS — F319 Bipolar disorder, unspecified: Secondary | ICD-10-CM | POA: Diagnosis present

## 2021-10-21 DIAGNOSIS — G7001 Myasthenia gravis with (acute) exacerbation: Principal | ICD-10-CM | POA: Diagnosis present

## 2021-10-21 DIAGNOSIS — R1310 Dysphagia, unspecified: Secondary | ICD-10-CM | POA: Diagnosis not present

## 2021-10-21 DIAGNOSIS — J9811 Atelectasis: Secondary | ICD-10-CM | POA: Diagnosis not present

## 2021-10-21 DIAGNOSIS — M25551 Pain in right hip: Secondary | ICD-10-CM | POA: Diagnosis not present

## 2021-10-21 DIAGNOSIS — Z833 Family history of diabetes mellitus: Secondary | ICD-10-CM | POA: Diagnosis not present

## 2021-10-21 DIAGNOSIS — R0789 Other chest pain: Secondary | ICD-10-CM | POA: Diagnosis present

## 2021-10-21 DIAGNOSIS — E1169 Type 2 diabetes mellitus with other specified complication: Secondary | ICD-10-CM | POA: Diagnosis not present

## 2021-10-21 DIAGNOSIS — M797 Fibromyalgia: Secondary | ICD-10-CM | POA: Diagnosis present

## 2021-10-21 DIAGNOSIS — R531 Weakness: Secondary | ICD-10-CM | POA: Diagnosis not present

## 2021-10-21 DIAGNOSIS — E039 Hypothyroidism, unspecified: Secondary | ICD-10-CM | POA: Diagnosis present

## 2021-10-21 DIAGNOSIS — Z452 Encounter for adjustment and management of vascular access device: Secondary | ICD-10-CM | POA: Diagnosis not present

## 2021-10-21 DIAGNOSIS — D638 Anemia in other chronic diseases classified elsewhere: Secondary | ICD-10-CM | POA: Diagnosis present

## 2021-10-21 DIAGNOSIS — Z8249 Family history of ischemic heart disease and other diseases of the circulatory system: Secondary | ICD-10-CM | POA: Diagnosis not present

## 2021-10-21 DIAGNOSIS — E119 Type 2 diabetes mellitus without complications: Secondary | ICD-10-CM | POA: Diagnosis present

## 2021-10-21 DIAGNOSIS — F419 Anxiety disorder, unspecified: Secondary | ICD-10-CM | POA: Diagnosis present

## 2021-10-21 DIAGNOSIS — Z832 Family history of diseases of the blood and blood-forming organs and certain disorders involving the immune mechanism: Secondary | ICD-10-CM | POA: Diagnosis not present

## 2021-10-21 DIAGNOSIS — Z4901 Encounter for fitting and adjustment of extracorporeal dialysis catheter: Secondary | ICD-10-CM | POA: Diagnosis not present

## 2021-10-21 DIAGNOSIS — M25552 Pain in left hip: Secondary | ICD-10-CM | POA: Diagnosis not present

## 2021-10-21 DIAGNOSIS — Z801 Family history of malignant neoplasm of trachea, bronchus and lung: Secondary | ICD-10-CM | POA: Diagnosis not present

## 2021-10-21 DIAGNOSIS — F11929 Opioid use, unspecified with intoxication, unspecified: Secondary | ICD-10-CM | POA: Diagnosis not present

## 2021-10-21 DIAGNOSIS — Z888 Allergy status to other drugs, medicaments and biological substances status: Secondary | ICD-10-CM | POA: Diagnosis not present

## 2021-10-21 DIAGNOSIS — I959 Hypotension, unspecified: Secondary | ICD-10-CM | POA: Diagnosis not present

## 2021-10-21 DIAGNOSIS — R600 Localized edema: Secondary | ICD-10-CM | POA: Diagnosis present

## 2021-10-21 DIAGNOSIS — K219 Gastro-esophageal reflux disease without esophagitis: Secondary | ICD-10-CM | POA: Diagnosis present

## 2021-10-21 DIAGNOSIS — E282 Polycystic ovarian syndrome: Secondary | ICD-10-CM | POA: Diagnosis present

## 2021-10-21 DIAGNOSIS — Z20822 Contact with and (suspected) exposure to covid-19: Secondary | ICD-10-CM | POA: Diagnosis present

## 2021-10-21 DIAGNOSIS — Z029 Encounter for administrative examinations, unspecified: Secondary | ICD-10-CM

## 2021-10-21 DIAGNOSIS — Z818 Family history of other mental and behavioral disorders: Secondary | ICD-10-CM | POA: Diagnosis not present

## 2021-10-21 DIAGNOSIS — E669 Obesity, unspecified: Secondary | ICD-10-CM | POA: Diagnosis not present

## 2021-10-21 DIAGNOSIS — I10 Essential (primary) hypertension: Secondary | ICD-10-CM | POA: Diagnosis present

## 2021-10-21 DIAGNOSIS — R0602 Shortness of breath: Secondary | ICD-10-CM | POA: Diagnosis not present

## 2021-10-21 DIAGNOSIS — Z885 Allergy status to narcotic agent status: Secondary | ICD-10-CM | POA: Diagnosis not present

## 2021-10-21 DIAGNOSIS — J3489 Other specified disorders of nose and nasal sinuses: Secondary | ICD-10-CM | POA: Diagnosis not present

## 2021-10-21 DIAGNOSIS — Z9049 Acquired absence of other specified parts of digestive tract: Secondary | ICD-10-CM | POA: Diagnosis not present

## 2021-10-21 DIAGNOSIS — G4733 Obstructive sleep apnea (adult) (pediatric): Secondary | ICD-10-CM | POA: Diagnosis present

## 2021-10-21 DIAGNOSIS — S0990XA Unspecified injury of head, initial encounter: Secondary | ICD-10-CM | POA: Diagnosis not present

## 2021-10-21 HISTORY — PX: IR FLUORO GUIDE CV LINE RIGHT: IMG2283

## 2021-10-21 HISTORY — PX: IR US GUIDE VASC ACCESS RIGHT: IMG2390

## 2021-10-21 LAB — COMPREHENSIVE METABOLIC PANEL
ALT: 12 U/L (ref 0–44)
AST: 14 U/L — ABNORMAL LOW (ref 15–41)
Albumin: 2.8 g/dL — ABNORMAL LOW (ref 3.5–5.0)
Alkaline Phosphatase: 53 U/L (ref 38–126)
Anion gap: 8 (ref 5–15)
BUN: 10 mg/dL (ref 6–20)
CO2: 26 mmol/L (ref 22–32)
Calcium: 8.2 mg/dL — ABNORMAL LOW (ref 8.9–10.3)
Chloride: 106 mmol/L (ref 98–111)
Creatinine, Ser: 0.56 mg/dL (ref 0.44–1.00)
GFR, Estimated: >60 mL/min (ref >60)
Glucose, Bld: 86 mg/dL (ref 70–99)
Potassium: 3.7 mmol/L (ref 3.5–5.1)
Sodium: 140 mmol/L (ref 135–145)
Total Bilirubin: 0.6 mg/dL (ref 0.3–1.2)
Total Protein: 5.8 g/dL — ABNORMAL LOW (ref 6.5–8.1)

## 2021-10-21 LAB — CBC
HCT: 33.3 % — ABNORMAL LOW (ref 36.0–46.0)
Hemoglobin: 10 g/dL — ABNORMAL LOW (ref 12.0–15.0)
MCH: 26.1 pg (ref 26.0–34.0)
MCHC: 30 g/dL (ref 30.0–36.0)
MCV: 86.9 fL (ref 80.0–100.0)
Platelets: 201 10*3/uL (ref 150–400)
RBC: 3.83 MIL/uL — ABNORMAL LOW (ref 3.87–5.11)
RDW: 12.8 % (ref 11.5–15.5)
WBC: 4.4 10*3/uL (ref 4.0–10.5)
nRBC: 0 % (ref 0.0–0.2)

## 2021-10-21 LAB — GLUCOSE, CAPILLARY: Glucose-Capillary: 119 mg/dL — ABNORMAL HIGH (ref 70–99)

## 2021-10-21 LAB — I-STAT VENOUS BLOOD GAS, ED
Acid-Base Excess: 4 mmol/L — ABNORMAL HIGH (ref 0.0–2.0)
Bicarbonate: 29.5 mmol/L — ABNORMAL HIGH (ref 20.0–28.0)
Calcium, Ion: 1.14 mmol/L — ABNORMAL LOW (ref 1.15–1.40)
HCT: 32 % — ABNORMAL LOW (ref 36.0–46.0)
Hemoglobin: 10.9 g/dL — ABNORMAL LOW (ref 12.0–15.0)
O2 Saturation: 94 %
Potassium: 3.3 mmol/L — ABNORMAL LOW (ref 3.5–5.1)
Sodium: 141 mmol/L (ref 135–145)
TCO2: 31 mmol/L (ref 22–32)
pCO2, Ven: 47.4 mmHg (ref 44–60)
pH, Ven: 7.401 (ref 7.25–7.43)
pO2, Ven: 72 mmHg — ABNORMAL HIGH (ref 32–45)

## 2021-10-21 LAB — MAGNESIUM: Magnesium: 1.7 mg/dL (ref 1.7–2.4)

## 2021-10-21 LAB — CORTISOL: Cortisol, Plasma: 0.6 ug/dL

## 2021-10-21 LAB — CBG MONITORING, ED
Glucose-Capillary: 94 mg/dL (ref 70–99)
Glucose-Capillary: 101 mg/dL — ABNORMAL HIGH (ref 70–99)

## 2021-10-21 LAB — D-DIMER, QUANTITATIVE: D-Dimer, Quant: 0.72 ug/mL-FEU — ABNORMAL HIGH (ref 0.00–0.50)

## 2021-10-21 LAB — SEDIMENTATION RATE: Sed Rate: 24 mm/hr — ABNORMAL HIGH (ref 0–22)

## 2021-10-21 LAB — HIV ANTIBODY (ROUTINE TESTING W REFLEX): HIV Screen 4th Generation wRfx: NONREACTIVE

## 2021-10-21 LAB — C-REACTIVE PROTEIN: CRP: 0.8 mg/dL (ref <1.0)

## 2021-10-21 LAB — PROCALCITONIN: Procalcitonin: <0.1 ng/mL

## 2021-10-21 MED ORDER — CEFAZOLIN (ANCEF) 1 G IV SOLR: 2.0000 g | INTRAVENOUS | Status: DC

## 2021-10-21 MED ORDER — HEPARIN SODIUM (PORCINE) 1000 UNIT/ML IJ SOLN
INTRAMUSCULAR | Status: AC
  Administered 2021-10-21: 3.2 mL
  Filled 2021-10-21: qty 10

## 2021-10-21 MED ORDER — HYDROXYZINE HCL 10 MG PO TABS
10.0000 mg | ORAL_TABLET | Freq: Three times a day (TID) | ORAL | Status: DC | PRN
  Administered 2021-10-21: 10 mg via ORAL
  Filled 2021-10-21: qty 1

## 2021-10-21 MED ORDER — FENTANYL CITRATE (PF) 100 MCG/2ML IJ SOLN
INTRAMUSCULAR | Status: AC
  Filled 2021-10-21: qty 4

## 2021-10-21 MED ORDER — SODIUM CHLORIDE 0.9 % IV BOLUS
1000.0000 mL | Freq: Once | INTRAVENOUS | Status: AC
  Administered 2021-10-21: 1000 mL via INTRAVENOUS

## 2021-10-21 MED ORDER — PREDNISONE 5 MG PO TABS: 10.0000 mg | ORAL_TABLET | Freq: Every day | ORAL | Status: DC

## 2021-10-21 MED ORDER — HEPARIN SODIUM (PORCINE) 5000 UNIT/ML IJ SOLN
5000.0000 [IU] | Freq: Three times a day (TID) | INTRAMUSCULAR | Status: DC
Start: 2021-10-21 — End: 2021-11-03
  Administered 2021-10-21 – 2021-11-03 (×37): 5000 [IU] via SUBCUTANEOUS
  Filled 2021-10-21 (×38): qty 1

## 2021-10-21 MED ORDER — CALCIUM GLUCONATE-NACL 2-0.675 GM/100ML-% IV SOLN
2.0000 g | Freq: Once | INTRAVENOUS | Status: DC
  Filled 2021-10-21: qty 100

## 2021-10-21 MED ORDER — CEFAZOLIN SODIUM-DEXTROSE 2-4 GM/100ML-% IV SOLN
INTRAVENOUS | Status: AC
  Administered 2021-10-21: 2 g via INTRAVENOUS
  Filled 2021-10-21: qty 100

## 2021-10-21 MED ORDER — QUETIAPINE FUMARATE 100 MG PO TABS: 400.0000 mg | ORAL_TABLET | Freq: Every day | ORAL | Status: DC

## 2021-10-21 MED ORDER — SODIUM CHLORIDE 0.9 % IV SOLN: Freq: Once | INTRAVENOUS | Status: DC

## 2021-10-21 MED ORDER — MIDAZOLAM HCL 2 MG/2ML IJ SOLN
INTRAMUSCULAR | Status: AC | PRN
  Administered 2021-10-21: 1 mg via INTRAVENOUS

## 2021-10-21 MED ORDER — CEFAZOLIN SODIUM-DEXTROSE 2-4 GM/100ML-% IV SOLN: 2.0000 g | Freq: Once | INTRAVENOUS | Status: AC

## 2021-10-21 MED ORDER — AZATHIOPRINE 50 MG PO TABS
150.0000 mg | ORAL_TABLET | Freq: Every morning | ORAL | Status: DC
  Administered 2021-10-21 – 2021-11-03 (×14): 150 mg via ORAL
  Filled 2021-10-21 (×15): qty 3

## 2021-10-21 MED ORDER — TOPIRAMATE 100 MG PO TABS
200.0000 mg | ORAL_TABLET | Freq: Every day | ORAL | Status: DC
  Administered 2021-10-21 – 2021-11-01 (×12): 200 mg via ORAL
  Filled 2021-10-21 (×14): qty 2

## 2021-10-21 MED ORDER — LEVOTHYROXINE SODIUM 75 MCG PO TABS
175.0000 ug | ORAL_TABLET | Freq: Every day | ORAL | Status: DC
  Administered 2021-10-21 – 2021-11-03 (×13): 175 ug via ORAL
  Filled 2021-10-21 (×14): qty 1

## 2021-10-21 MED ORDER — GELATIN ABSORBABLE 12-7 MM EX MISC
CUTANEOUS | Status: AC
  Filled 2021-10-21: qty 1

## 2021-10-21 MED ORDER — ACETAMINOPHEN 650 MG RE SUPP: 650.0000 mg | Freq: Four times a day (QID) | RECTAL | Status: DC | PRN

## 2021-10-21 MED ORDER — ACETAMINOPHEN 325 MG PO TABS
650.0000 mg | ORAL_TABLET | Freq: Four times a day (QID) | ORAL | Status: DC | PRN
  Administered 2021-10-26 – 2021-11-02 (×3): 650 mg via ORAL
  Filled 2021-10-21 (×3): qty 2

## 2021-10-21 MED ORDER — LIDOCAINE-EPINEPHRINE 1 %-1:100000 IJ SOLN
INTRAMUSCULAR | Status: AC
  Filled 2021-10-21: qty 1

## 2021-10-21 MED ORDER — HEPARIN SODIUM (PORCINE) 1000 UNIT/ML IJ SOLN
1000.0000 [IU] | Freq: Once | INTRAMUSCULAR | Status: DC
  Filled 2021-10-21: qty 1

## 2021-10-21 MED ORDER — SODIUM CHLORIDE 0.9 % IV SOLN: INTRAVENOUS | Status: DC

## 2021-10-21 MED ORDER — TRAZODONE HCL 100 MG PO TABS
100.0000 mg | ORAL_TABLET | Freq: Every day | ORAL | Status: DC
  Administered 2021-10-21: 100 mg via ORAL
  Filled 2021-10-21: qty 1

## 2021-10-21 MED ORDER — QUETIAPINE FUMARATE 100 MG PO TABS
100.0000 mg | ORAL_TABLET | Freq: Every day | ORAL | Status: DC
  Administered 2021-10-21: 100 mg via ORAL
  Filled 2021-10-21: qty 1

## 2021-10-21 MED ORDER — ACD FORMULA A 0.73-2.45-2.2 GM/100ML VI SOLN
1000.0000 mL | Status: DC
  Filled 2021-10-21 (×3): qty 1000

## 2021-10-21 MED ORDER — INSULIN ASPART 100 UNIT/ML IJ SOLN
0.0000 [IU] | Freq: Three times a day (TID) | INTRAMUSCULAR | Status: DC
  Administered 2021-10-22: 2 [IU] via SUBCUTANEOUS
  Administered 2021-10-23: 3 [IU] via SUBCUTANEOUS
  Administered 2021-10-25 (×2): 1 [IU] via SUBCUTANEOUS
  Administered 2021-10-26 (×2): 2 [IU] via SUBCUTANEOUS
  Administered 2021-10-27: 1 [IU] via SUBCUTANEOUS
  Administered 2021-10-27 – 2021-10-28 (×3): 2 [IU] via SUBCUTANEOUS
  Administered 2021-10-29: 3 [IU] via SUBCUTANEOUS
  Administered 2021-10-29 – 2021-10-30 (×3): 1 [IU] via SUBCUTANEOUS
  Administered 2021-10-30 – 2021-10-31 (×4): 2 [IU] via SUBCUTANEOUS
  Administered 2021-11-01: 3 [IU] via SUBCUTANEOUS

## 2021-10-21 MED ORDER — MIDAZOLAM HCL 2 MG/2ML IJ SOLN
INTRAMUSCULAR | Status: AC
  Filled 2021-10-21: qty 4

## 2021-10-21 MED ORDER — CALCIUM CARBONATE ANTACID 500 MG PO CHEW
2.0000 | CHEWABLE_TABLET | ORAL | Status: AC
  Administered 2021-10-21: 400 mg via ORAL
  Filled 2021-10-21: qty 2

## 2021-10-21 MED ORDER — MOMETASONE FURO-FORMOTEROL FUM 100-5 MCG/ACT IN AERO
2.0000 | INHALATION_SPRAY | Freq: Two times a day (BID) | RESPIRATORY_TRACT | Status: DC
  Administered 2021-10-21 – 2021-11-02 (×21): 2 via RESPIRATORY_TRACT
  Filled 2021-10-21: qty 8.8

## 2021-10-21 MED ORDER — ALPRAZOLAM 0.5 MG PO TABS
1.0000 mg | ORAL_TABLET | Freq: Four times a day (QID) | ORAL | Status: DC | PRN
Start: 2021-10-21 — End: 2021-10-22
  Administered 2021-10-22: 1 mg via ORAL
  Filled 2021-10-21: qty 2

## 2021-10-21 MED ORDER — PYRIDOSTIGMINE BROMIDE 60 MG PO TABS: 60.0000 mg | ORAL_TABLET | Freq: Four times a day (QID) | ORAL | Status: DC

## 2021-10-21 MED ORDER — ACETAMINOPHEN 325 MG PO TABS: 650.0000 mg | ORAL_TABLET | ORAL | Status: DC | PRN

## 2021-10-21 MED ORDER — PREDNISONE 10 MG PO TABS
10.0000 mg | ORAL_TABLET | Freq: Every day | ORAL | Status: DC
Start: 2021-10-21 — End: 2021-11-03
  Administered 2021-10-22 – 2021-11-03 (×12): 10 mg via ORAL
  Filled 2021-10-21 (×9): qty 1
  Filled 2021-10-21: qty 2
  Filled 2021-10-21 (×3): qty 1

## 2021-10-21 MED ORDER — ALBUTEROL SULFATE (2.5 MG/3ML) 0.083% IN NEBU: 2.5000 mg | INHALATION_SOLUTION | RESPIRATORY_TRACT | Status: DC | PRN

## 2021-10-21 MED ORDER — DULOXETINE HCL 60 MG PO CPEP
60.0000 mg | ORAL_CAPSULE | Freq: Every day | ORAL | Status: DC
  Administered 2021-10-21 – 2021-11-03 (×13): 60 mg via ORAL
  Filled 2021-10-21 (×13): qty 1

## 2021-10-21 MED ORDER — GABAPENTIN 300 MG PO CAPS
300.0000 mg | ORAL_CAPSULE | Freq: Every day | ORAL | Status: DC
  Administered 2021-10-21: 300 mg via ORAL
  Filled 2021-10-21: qty 1

## 2021-10-21 MED ORDER — CHLORHEXIDINE GLUCONATE CLOTH 2 % EX PADS
6.0000 | MEDICATED_PAD | Freq: Every day | CUTANEOUS | Status: DC
  Administered 2021-10-22 – 2021-11-03 (×12): 6 via TOPICAL

## 2021-10-21 MED ORDER — PYRIDOSTIGMINE BROMIDE 60 MG PO TABS
60.0000 mg | ORAL_TABLET | Freq: Four times a day (QID) | ORAL | Status: DC
Start: 2021-10-21 — End: 2021-11-03
  Administered 2021-10-21 – 2021-11-03 (×45): 60 mg via ORAL
  Filled 2021-10-21 (×57): qty 1

## 2021-10-21 MED ORDER — INSULIN ASPART 100 UNIT/ML IJ SOLN: 0.0000 [IU] | INTRAMUSCULAR | Status: DC

## 2021-10-21 MED ORDER — ALBUMIN HUMAN 25 % IV SOLN
12.5000 g | Freq: Once | INTRAVENOUS | Status: AC
  Administered 2021-10-21: 12.5 g via INTRAVENOUS
  Filled 2021-10-21: qty 50

## 2021-10-21 MED ORDER — OXYCODONE HCL 5 MG PO TABS
15.0000 mg | ORAL_TABLET | Freq: Four times a day (QID) | ORAL | Status: DC | PRN
  Administered 2021-10-21 – 2021-10-22 (×2): 15 mg via ORAL
  Filled 2021-10-21 (×2): qty 3

## 2021-10-21 MED ORDER — DIPHENHYDRAMINE HCL 25 MG PO CAPS: 25.0000 mg | ORAL_CAPSULE | Freq: Four times a day (QID) | ORAL | Status: DC | PRN

## 2021-10-21 MED ORDER — PANTOPRAZOLE SODIUM 40 MG PO TBEC
40.0000 mg | DELAYED_RELEASE_TABLET | Freq: Every day | ORAL | Status: DC
  Administered 2021-10-22 – 2021-11-03 (×12): 40 mg via ORAL
  Filled 2021-10-21 (×12): qty 1

## 2021-10-21 MED ORDER — BUTALBITAL-APAP-CAFFEINE 50-325-40 MG PO TABS: 1.0000 | ORAL_TABLET | ORAL | Status: DC | PRN

## 2021-10-21 MED ORDER — AZATHIOPRINE 50 MG PO TABS
150.0000 mg | ORAL_TABLET | Freq: Every day | ORAL | Status: DC
  Filled 2021-10-21: qty 3

## 2021-10-21 MED ORDER — FENTANYL CITRATE (PF) 100 MCG/2ML IJ SOLN
INTRAMUSCULAR | Status: AC | PRN
  Administered 2021-10-21: 25 ug via INTRAVENOUS

## 2021-10-21 MED ORDER — IOHEXOL 350 MG/ML SOLN
50.0000 mL | Freq: Once | INTRAVENOUS | Status: AC | PRN
  Administered 2021-10-21: 50 mL via INTRAVENOUS

## 2021-10-21 MED ORDER — LIDOCAINE-EPINEPHRINE 2 %-1:100000 IJ SOLN
INTRAMUSCULAR | Status: AC | PRN
  Administered 2021-10-21: 10 mL

## 2021-10-21 NOTE — Procedures (Signed)
Interventional Radiology Procedure Note  Procedure: RT IJ HD CATH FOR PLASMAPHERESIS    Complications: None  Estimated Blood Loss:  MIN  Findings: TIP SVCRA    Tamera Punt, MD

## 2021-10-21 NOTE — Progress Notes (Signed)
Jade Mathis  RKY:[id number] DOB: 03-Jul-1975 DOA: 10/20/2021 PCP: Hoyt Koch, MD    Brief Narrative:  46 year old with a history of anxiety, asthma, bipolar 1 disorder, DM2, GERD, OSA intolerant of CPAP, migraines, and myasthenia gravis diagnosed in 1997 status post thymectomy who is chronically on prednisone 10 mg, azathioprine 150 mg, and Mestinon 60 mg 4 times daily.  She presented to [facility] with a 3-week history of gradually progressive generalized weakness, shortness of breath, double vision, difficulty with mastication, and chest pressure.  She states the symptoms are all consistent with her previous myasthenia flares.  Of note she was scheduled to have an IVIG infusion on 8/4.  In the ER she was evaluated by neurology who felt that she was in fact suffering a myasthenic crisis.  Plasma exchange has been ordered.  Consultants:  Neurology  Goals of Care:  Code Status: Full Code   DVT prophylaxis: Subcu heparin  Interim Hx: Patient was interviewed and examined by one of my partners earlier today.  I did see the patient for follow-up exam to confirm that she was not experiencing any respiratory distress whatsoever.  Assessment & Plan:  Acute myasthenic crisis Continue to monitor NIF and VC - NPO for now - for plasmaphoresis under direction of Neurology with plan for treatments QOD x5 sessions - avoid medications that may worsen or trigger MG exacerbation: Class IA antiarrhythmics, magnesium, flouroquinolones, macrolides, aminoglycosides, penicillamine, curare, interferon alpha, botox, quinine. Use with caution: calcium channel blocker, beta blockers and statins.  Anxiety disorder/bipolar 1 disorder  DM2  HTN  Normocytic anemia  Asthma No acute exacerbation  Hypothyroidism Continue usual Synthroid dose  OSA Reports being intolerant to CPAP   Family Communication: No family present at time of exam Disposition: From home -eventual discharge  home -patient is interested in investigating the option of outpatient plasmapheresis   Objective: Blood pressure 110/73, pulse 68, temperature 98.3 F (36.8 C), temperature source Oral, resp. rate 17, last menstrual period 10/13/2021, SpO2 98 %. No intake or output data in the 24 hours ending 10/21/21 0804 There were no vitals filed for this visit.  Examination: Patient was interviewed and examined by one of my partners earlier this morning.  CBC: Recent Labs  Lab 10/20/21 1748 10/21/21 0510  WBC 7.2  --   NEUTROABS 4.5  --   HGB 11.4* 10.9*  HCT 36.8 32.0*  MCV 84.6  --   PLT 251  --    Basic Metabolic Panel: Recent Labs  Lab 10/20/21 1748 10/21/21 0510  NA 138 141  K 3.3* 3.3*  CL 99  --   CO2 31  --   GLUCOSE 92  --   BUN 15  --   CREATININE 0.62  --   CALCIUM 9.3  --    GFR: Estimated Creatinine Clearance: 90.6 mL/min (by C-G formula based on SCr of 0.62 mg/dL).  Liver Function Tests: Recent Labs  Lab 10/20/21 1748  AST 11*  ALT 8  ALKPHOS 65  BILITOT 0.3  PROT 6.9  ALBUMIN 3.8    HbA1C: Hgb A1c MFr Bld  Date/Time Value Ref Range Status  08/14/2020 02:32 PM 5.2 4.8 - 5.6 % Final    Comment:    (NOTE)         Prediabetes: 5.7 - 6.4         Diabetes: >6.4         Glycemic control for adults with diabetes: <7.0   07/09/2018 02:55 PM 6.9 (  H) 4.6 - 6.5 % Final    Comment:    Glycemic Control Guidelines for People with Diabetes:Non Diabetic:  <6%Goal of Therapy: <7%Additional Action Suggested:  >8%     Scheduled Meds:  azaTHIOprine  150 mg Oral Daily   azaTHIOprine  150 mg Oral q AM   calcium carbonate  2 tablet Oral Q3H   DULoxetine  60 mg Oral Daily   heparin  5,000 Units Subcutaneous Q8H   heparin sodium (porcine)  1,000 Units Intracatheter Once   insulin aspart  0-9 Units Subcutaneous Q4H   levothyroxine  175 mcg Oral Daily   mometasone-formoterol  2 puff Inhalation BID   pantoprazole  40 mg Oral Daily   predniSONE  10 mg Oral Q  breakfast   pyridostigmine  60 mg Oral QID   pyridostigmine  60 mg Oral QID   QUEtiapine  100 mg Oral QHS   topiramate  200 mg Oral Q1500   Continuous Infusions:  sodium chloride     albumin human 25 % 50 g in sodium chloride 0.9 %     calcium gluconate     citrate dextrose       LOS: 1 day   Cherene Altes, MD [facility] Office  [phone] Pager - Text Page per Amion  If 7PM-7AM, please contact night-coverage per Amion 10/21/2021, 8:04 AM

## 2021-10-21 NOTE — ED Notes (Signed)
Pt will like to take morning medications after her procedure and when she can eat. She stated taking the medication on an empty stomach will make her sick to the stomach. (See MAR for refusal of medications).

## 2021-10-21 NOTE — ED Notes (Signed)
Patient witnessed in the floor reporting that she was not provided a ginger ale that she requested. Patient was told by multiple nurses that she could have a ginger ale and the patient refused to allow the nurses to enter her room or speak to her. Patient alert and oriented and admits that she put herself in the floor because she was not given a ginger ale. Patient continues to refuse to allow staff to help her. ED providers with patient at this time.

## 2021-10-21 NOTE — Progress Notes (Signed)
[facility] night coverage note:  Pt requested home oxy PRN be ordered.  I confirmed with [employer]: it does indeed appear that she takes oxycodone IR '15mg'$  Q6H (PRN) (last fill 120 tabs for 30 day script 7/18), appears to be chronic med going back for months.  Ordered home Oxy.  Also as a side note looks like she takes Xanax '1mg'$  Q6H PRN too at home chronically, patient didn't ask about this, but I had RN ask patient and patient confirmed taking this regularly as well.  Ordered Xanax PRN as well because I dont want her to go into benzo withdrawal.  RN knows to watch closely for respiratory depression, especially with myasthenic crisis.

## 2021-10-21 NOTE — ED Notes (Signed)
Pt transported to CT ?

## 2021-10-21 NOTE — Progress Notes (Signed)
NIF-18 VC 450 ml Moderate pt effort. pt stated that she is having chest pain due to catheter placement and it is effecting her ability to perform NIF and VC at her best effort. Pt VS stable no distress noted.
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
## 2021-10-21 NOTE — Plan of Care (Signed)
Neurology Plan of Care Note  Patient's husband is at bedside, discussed PLEX therapy with family and answered all questions.  Per patient last dose of ACE-I was 8/3 at 06:00. Will need to complete a 48-72 hour washout period before initiating PLEX. Will not initiate PLEX orders until at least 8/5 at 06:00. IR consulted for placement of tunneled catheter, patient has remained NPO.  Further recommendations per Dr. Bartholome Bill consult note.   Jade Mathis, AGACNP-BC [facility] Pager: [phone]  No charge note.

## 2021-10-21 NOTE — ED Notes (Signed)
Lunch tray ordered 

## 2021-10-21 NOTE — ED Notes (Signed)
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 surgery with scattered surgical clips. Lungs/Pleura: Dependent atelectasis BILATERAL lower lobes. Remaining lungs clear. No infiltrate, pleural effusion, or pneumothorax Upper Abdomen: Prior gastric bypass surgery. Remaining visualized upper abdomen unremarkable. Musculoskeletal: No acute osseous findings. Sternal wires from prior thymectomy. Review of the MIP images confirms the above findings. IMPRESSION: No evidence of pulmonary embolism. No acute intrathoracic abnormalities. Aortic Atherosclerosis (ICD10-I70.0). Electronically Signed   By: Lavonia Dana M.D.    On: 10/21/2021 09:49   DG Chest Portable 1 View  Result Date: 10/20/2021 CLINICAL DATA:  Chest pressure and shortness of breath. EXAM: PORTABLE CHEST 1 VIEW COMPARISON:  May 20, 2021 FINDINGS: Multiple sternal wires are noted. The heart size and mediastinal contours are within normal limits. Both lungs are clear. The visualized skeletal structures are unremarkable. IMPRESSION: No active cardiopulmonary disease. Electronically Signed   By: Virgina Norfolk M.D.   On: 10/20/2021 17:47    Pending Labs Unresulted Labs (From admission, onward)     Start     Ordered   10/22/21 6759  Basic metabolic panel  Tomorrow morning,   R        10/21/21 0832   10/22/21 0500  CBC  Tomorrow morning,   R        10/21/21 0832            Vitals/Pain Today's Vitals   10/21/21 1320 10/21/21 1650 10/21/21 1705 10/21/21 1750  BP: (!) 109/59 (!) 87/56 104/84   Pulse: 86 81 80   Resp: '16 18 16   '$ Temp:  97.7 F (36.5 C)    TempSrc:  Oral    SpO2: 100% 94% 96%   PainSc:    10-Worst pain ever    Isolation Precautions No active isolations  Medications Medications  pyridostigmine (MESTINON) tablet 60 mg (60 mg Oral Patient Refused/Not Given 10/21/21 1449)  predniSONE (DELTASONE) tablet 10 mg (10 mg Oral Patient Refused/Not Given 10/21/21 0844)  acetaminophen (TYLENOL) tablet 650 mg (has no administration in time range)  diphenhydrAMINE (BENADRYL) capsule 25 mg (has no administration in time range)  calcium gluconate 2 g/ 100 mL sodium chloride IVPB (2,000 mg Intravenous Patient Refused/Not Given 10/21/21 1007)  citrate dextrose (ACD-A anticoagulant) solution 1,000 mL (has no administration in time range)  albumin human 25 % 50 g in sodium chloride 0.9 % (has no administration in time range)  heparin sodium (porcine) injection 1,000 Units (has no administration in time range)  calcium carbonate (TUMS - dosed in mg elemental calcium) chewable tablet 400 mg of elemental calcium (400 mg of elemental calcium  Oral Patient Refused/Not Given 10/21/21 1006)  DULoxetine (CYMBALTA) DR capsule 60 mg (60 mg Oral Patient Refused/Not Given 10/21/21 1007)  hydrOXYzine (ATARAX) tablet 10 mg (has no administration in time range)  QUEtiapine (SEROQUEL) tablet 100 mg (has no administration in time range)  levothyroxine (SYNTHROID) tablet 175 mcg (175 mcg Oral Given 10/21/21 0839)  pantoprazole (PROTONIX) EC tablet 40 mg (40 mg Oral Patient Refused/Not Given 10/21/21 1008)  azaTHIOprine (IMURAN) tablet 150 mg (150 mg Oral Given 10/21/21 0947)  topiramate (TOPAMAX) tablet 200 mg (has no administration in time range)  albuterol (PROVENTIL) (2.5 MG/3ML) 0.083% nebulizer solution 2.5 mg (has no administration in time range)  mometasone-formoterol (DULERA) 100-5 MCG/ACT inhaler 2 puff (2 puffs Inhalation Patient Refused/Not Given 10/21/21 1008)  heparin injection 5,000 Units (5,000 Units Subcutaneous Given 10/21/21 0834)  0.9 %  sodium chloride infusion ( Intravenous New Bag/Given 10/21/21 1405)  acetaminophen (TYLENOL) tablet 650 mg (has no administration in time range)  insulin aspart (novoLOG) injection 0-9 Units ( Subcutaneous Not Given 10/21/21 1340)  lidocaine-EPINEPHrine (XYLOCAINE W/EPI) 1 %-1:100000 (with pres) injection (has no administration in time range)  fentaNYL (SUBLIMAZE) 100 MCG/2ML injection (has no administration in time range)  midazolam (VERSED) 2 MG/2ML injection (has no administration in time range)  gelatin adsorbable (GELFOAM/SURGIFOAM) 12-7 MM sponge 12-7 mm (has no administration in time range)  ceFAZolin (ANCEF) powder 2 g (2 g Other Not Given 10/21/21 1248)  sodium chloride 0.9 % bolus 1,000 mL (0 mLs Intravenous Stopped 10/20/21 2025)  sodium chloride 0.9 % bolus 1,000 mL (0 mLs Intravenous Stopped 10/21/21 0636)  albumin human 25 % solution 12.5 g (0 g Intravenous Stopped 10/21/21 0813)  iohexol (OMNIPAQUE) 350 MG/ML injection 50 mL (50 mLs Intravenous Contrast Given 10/21/21 0916)  heparin sodium (porcine) 1000  UNIT/ML injection (3.2 mLs  Given 10/21/21 1304)  midazolam (VERSED) injection (1 mg Intravenous Given 10/21/21 1252)  fentaNYL (SUBLIMAZE) injection (25 mcg Intravenous Given 10/21/21 1253)  lidocaine-EPINEPHrine (XYLOCAINE W/EPI) 2 %-1:100000 (with pres) injection (10 mLs Infiltration Given 10/21/21 1302)  ceFAZolin (ANCEF) IVPB 2g/100 mL premix (0 g Intravenous Stopped 10/21/21 1318)    Mobility walks with device High fall risk   R Recommendations: See Admitting Provider Note  Report given to: Nicanor Bake RN  Additional Notes:  Pt is manipulative and will refuse care.

## 2021-10-21 NOTE — ED Notes (Signed)
Pt given food and beverage 

## 2021-10-21 NOTE — Progress Notes (Signed)
Bilateral LE venous duplex study completed. Please see CV Proc for preliminary results.  Asahel Risden BS, RVT 10/21/2021 2:54 PM

## 2021-10-21 NOTE — ED Provider Notes (Signed)
Patient evaluated by neurology.  Feels true weakness is present.  Unfortunately is having some chest tightness.  While troponins are negative, cannot fully rule out ACS.  For this reason, recommendation would be for plasma exchange.  He has put in orders for plasma exchange but she will need IR placed HD catheter.  Will relay to primary team.   Merryl Hacker, MD 10/21/21 0100

## 2021-10-21 NOTE — ED Notes (Signed)
Pt transported to IR 

## 2021-10-21 NOTE — Progress Notes (Signed)
Pt completed 1.3L on vital capacity measurements and a measurement of 15cmH2O for NIF.

## 2021-10-21 NOTE — ED Notes (Addendum)
This RN was told by additional RN and NT that patient had two episodes of low blood pressure. This RN went into room to obtain a new blood pressure. While in room patient was being verbally aggressive to this RN calling this RN a child and refusing care from this RN. When this RN tried to deescalate the situation and educate pt on the benefits of care, pt continued to be verbally aggressive. Other RN had to come in and help this RN out of the situation, to which pt responded by getting on the call light and complaining to the NS outside at nursing station. This RN left patient alone in order to remove myself from the situation before it escalated further. Charge nurse was notified about this interaction as well as well as follow-up interactions with NT.

## 2021-10-21 NOTE — ED Notes (Signed)
Pt refused her meal tray. Pt did eat crackers and applesauce but refuse to take medication because she did not eat her meal tray. Pt was offer other options but stated she eat at [employer], noted. Pt is alert and oriented times 4. Husband at the bedside, noted. Call bell in reach, room near nurse's station.

## 2021-10-21 NOTE — ED Notes (Signed)
RN and this NT went to PT's room to take PT a Ginger Ale , PT was standing  at the door with the IV pole , The RN asked PT if she need help due to her low BP, RN also asked PT if she would allow her to disconnect the IV so PT could walk freely , PT refused to answer , PT continued to walk past [place]. When PT got almost near the end of the Nurses station PT placed her self on the floor as though she had fallen. This NT witness this. When asked if we could help PT to get up PT insisted on only allowing the DR. To help. The PA , and DR. Asisted the PT off the floor , PT was placed in the chair and this tech and another tech pushed the PT to her room, we instructed PT on how to get out of the chair safety, PT stated she wasn't ready PT stated that she would move when she was ready! Than PT called her husband and spoke with him for about 25 mins before stating that she was ready. Once PT was in the bed I attempted to cover PT up, PT stated I didn't tell you  o put that on my leg take it off ! While taking the blanket off , this NT expressed to the PT that we were just trying to help, PT stated that she don't need our help , this NT at that time left the room.Marland KitchenMarland Kitchen

## 2021-10-21 NOTE — ED Notes (Addendum)
This RN attempted to get up to date vitals prior to transportation to floor. Patient's blood pressure was low upon assessment. When this RN attempted to give IV fluids, RN discovered that patient's IV had been removed during pt's fall. When this RN attempted to get a new line, patient refused and would not allow any other RN to attempt to get a second IV in. During this entire interaction patient was videotaping this RN and talking to a family member. This RN informed pt that filming this RN was not comfortable with patient filming her and asked her politely to stop. This RN left the room. IV team orders were placed and admitting nurse was notified of recent events and the fact that patient no longer has an IV. Patient continues to be AxOx4 and is not symptomatic with her low BP.

## 2021-10-21 NOTE — Progress Notes (Signed)
RT attempted x2 to perform NIF and VC but Pt was unable at this time. RT will try again later when Pt is available. Pt's Vitals looks stable and is in no resp. distress.

## 2021-10-21 NOTE — Progress Notes (Signed)
Pt's NIF and VC as follows: Negative insp. Force : -20cmH2O Vital Capacity: 1.46L Great effort.

## 2021-10-21 NOTE — Progress Notes (Signed)
Pt's NIF and VC as follows: NIF: -10cmH2O VC: 8.7L MD aware of values

## 2021-10-21 NOTE — ED Notes (Signed)
Pt is alert and stable. Pt is on the way to IR for her procedure, noted. Husband have her personal items, noted.

## 2021-10-21 NOTE — ED Notes (Addendum)
This RN and NT Elineen saw the pt attempting to walk out of the room when we were placing a  ginger ale that she asked for her in the room. Pt was offered help by both myself and the NT to help her walk and detatched the IV line to help improve safety. The pt declined all help. Pt continued to walk away from staff and decline care. Pt fell (assisted herself to the floor) and refused to have staff help the pt up stating "Do not touch me".Pt did not hit her head during fall.  EDP  notified and with pt. Pt alert and oriented stating that she put herself in the floor because she did not receive her ginger ale.

## 2021-10-21 NOTE — Progress Notes (Addendum)
   10/21/21 2000  Vitals  Temp 98 F (36.7 C)  Temp Source Oral  BP 106/71  MAP (mmHg) 83  BP Location Left Arm  BP Method Automatic  Patient Position (if appropriate) Lying  Pulse Rate 90  Pulse Rate Source Monitor  ECG Heart Rate 91  Resp 16  MEWS COLOR  MEWS Score Color Green  Oxygen Therapy  SpO2 100 %  O2 Device Room Air   Received pt from ED. VSS. AOx4. C/o 10/10 headache and general body aches- called on-call for [facility] to restart her home pain meds/anxiety meds and these were re-ordered. Husband at bedside.  Pt assist x1 to bathroom. CHG bath done.  R chest HD cath w/ gauze dressing, tiny amount of blood on gauze. Otherwise no other skin issues. Pt oriented to room and call light. Ordered dinner. No issues swallowing noted and breathing comfortably on room air. No other needs at this time.
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
## 2021-10-21 NOTE — ED Notes (Signed)
Pt returned from IR.

## 2021-10-21 NOTE — ED Notes (Signed)
Went into take PT'S temp , PT stated step out the room I'm on the phone , PT stated she will call when she is ready to have her temp taken.

## 2021-10-21 NOTE — ED Provider Notes (Signed)
Patient currently admitted to medicine team.  I was called to the bedside as patient somehow got up out of bed and fell on the floor.  She is complaining that she hit her head and has a left hip pain.  Patient is not on blood thinners.  We will get a head CT and x-ray of her hip.  I have asked nursing staff to notify primary team.  Overall she is neurologically intact.  Appears well.  She is frustrated about the situation.  Overall I will get images and make sure she did not suffer any injury.  She is being admitted for myasthenia gravis flare.   Lennice Sites, DO 10/21/21 1727

## 2021-10-21 NOTE — ED Notes (Addendum)
Pt refusing all meds, states she has not eaten anything and doesn't want meds until she eats something; pt recently consumed crackers and peanut butter, was offered [place] sandwich, which pt declined; when lunch tray arrived, pt stated "I dont want that"; Dr. Thereasa Solo notified

## 2021-10-22 ENCOUNTER — Other Ambulatory Visit: Payer: Medicare Other

## 2021-10-22 DIAGNOSIS — F11929 Opioid use, unspecified with intoxication, unspecified: Secondary | ICD-10-CM | POA: Diagnosis not present

## 2021-10-22 DIAGNOSIS — G7001 Myasthenia gravis with (acute) exacerbation: Secondary | ICD-10-CM | POA: Diagnosis not present

## 2021-10-22 LAB — CBC
HCT: 34.7 % — ABNORMAL LOW (ref 36.0–46.0)
Hemoglobin: 10.5 g/dL — ABNORMAL LOW (ref 12.0–15.0)
MCH: 26.4 pg (ref 26.0–34.0)
MCHC: 30.3 g/dL (ref 30.0–36.0)
MCV: 87.2 fL (ref 80.0–100.0)
Platelets: 211 10*3/uL (ref 150–400)
RBC: 3.98 MIL/uL (ref 3.87–5.11)
RDW: 12.8 % (ref 11.5–15.5)
WBC: 6.2 10*3/uL (ref 4.0–10.5)
nRBC: 0 % (ref 0.0–0.2)

## 2021-10-22 LAB — BLOOD GAS, VENOUS
Acid-Base Excess: 0.2 mmol/L (ref 0.0–2.0)
Bicarbonate: 28.7 mmol/L — ABNORMAL HIGH (ref 20.0–28.0)
O2 Saturation: 64.1 %
Patient temperature: 37
pCO2, Ven: 67 mmHg — ABNORMAL HIGH (ref 44–60)
pH, Ven: 7.24 — ABNORMAL LOW (ref 7.25–7.43)
pO2, Ven: 34 mmHg (ref 32–45)

## 2021-10-22 LAB — BASIC METABOLIC PANEL
Anion gap: 8 (ref 5–15)
BUN: 9 mg/dL (ref 6–20)
CO2: 28 mmol/L (ref 22–32)
Calcium: 8.4 mg/dL — ABNORMAL LOW (ref 8.9–10.3)
Chloride: 102 mmol/L (ref 98–111)
Creatinine, Ser: 0.55 mg/dL (ref 0.44–1.00)
GFR, Estimated: >60 mL/min (ref >60)
Glucose, Bld: 115 mg/dL — ABNORMAL HIGH (ref 70–99)
Potassium: 4 mmol/L (ref 3.5–5.1)
Sodium: 138 mmol/L (ref 135–145)

## 2021-10-22 LAB — GLUCOSE, CAPILLARY
Glucose-Capillary: 83 mg/dL (ref 70–99)
Glucose-Capillary: 90 mg/dL (ref 70–99)
Glucose-Capillary: 160 mg/dL — ABNORMAL HIGH (ref 70–99)
Glucose-Capillary: 160 mg/dL — ABNORMAL HIGH (ref 70–99)

## 2021-10-22 MED ORDER — SODIUM CHLORIDE 0.9 % IV BOLUS
500.0000 mL | Freq: Once | INTRAVENOUS | Status: AC
  Administered 2021-10-22: 500 mL via INTRAVENOUS

## 2021-10-22 MED ORDER — NALOXONE HCL 0.4 MG/ML IJ SOLN
0.4000 mg | INTRAMUSCULAR | Status: DC | PRN
  Administered 2021-10-22 (×2): 0.4 mg via INTRAVENOUS
  Filled 2021-10-22: qty 1

## 2021-10-22 MED ORDER — NALOXONE HCL 0.4 MG/ML IJ SOLN
INTRAMUSCULAR | Status: AC
  Filled 2021-10-22: qty 1

## 2021-10-22 MED ORDER — POLYVINYL ALCOHOL 1.4 % OP SOLN: 1.0000 [drp] | OPHTHALMIC | Status: DC | PRN

## 2021-10-22 MED ORDER — TRAZODONE HCL 50 MG PO TABS
50.0000 mg | ORAL_TABLET | Freq: Every day | ORAL | Status: DC
  Administered 2021-10-22 – 2021-11-02 (×12): 50 mg via ORAL
  Filled 2021-10-22 (×12): qty 1

## 2021-10-22 MED ORDER — OXYCODONE HCL 5 MG PO TABS: 5.0000 mg | ORAL_TABLET | Freq: Four times a day (QID) | ORAL | Status: DC | PRN

## 2021-10-22 MED ORDER — KETOROLAC TROMETHAMINE 15 MG/ML IJ SOLN
15.0000 mg | Freq: Three times a day (TID) | INTRAMUSCULAR | Status: AC | PRN
  Administered 2021-10-22 – 2021-10-25 (×4): 15 mg via INTRAVENOUS
  Filled 2021-10-22 (×4): qty 1

## 2021-10-22 MED ORDER — NALOXONE HCL 0.4 MG/ML IJ SOLN
0.4000 mg | INTRAMUSCULAR | Status: AC
  Administered 2021-10-22 (×3): 0.4 mg via INTRAVENOUS
  Filled 2021-10-22 (×3): qty 1

## 2021-10-22 NOTE — Significant Event (Signed)
Rapid Response Event Note   Reason for Call :  Lethargy and Hypotension  Initial Focused Assessment:  Received a call from the RN taking care of her today who stated that she has been more lethargic than previously. Her BP have been low (SBP 70's). The MD ordered a 500cc crystalloid bolus. The patient still remains lethargic and she is not finishing her sentences. CBG 161.  0.4 narcan given due to her receiving pain medication and Seroquel overnight. She did respond to the narcan and her pressure increased to SBP 90's.  MD changed her PRN medication dosage to a smaller dose.    Plan of Care:   ABG and VBG ordered BP checks q30 minutes Minimize sedating medication Call rapid response if she continues to be lethargic or worsens   Event Summary:   MD Notified: Neuro and Dr. Thereasa Solo at bedside Call Time: [place] Time: [place] Time: [place], RN

## 2021-10-22 NOTE — Progress Notes (Signed)
NIF -30 VC: 1.6L with good pt effort

## 2021-10-22 NOTE — Progress Notes (Signed)
NIF -25 VC 1.14L Patient alert with good effort.

## 2021-10-22 NOTE — Progress Notes (Addendum)
Jade Mathis  ZOX:[id number] DOB: 09-Jul-1975 DOA: 10/20/2021 PCP: Hoyt Koch, MD    Brief Narrative:  46 year old with a history of anxiety, asthma, bipolar 1 disorder, DM2, GERD, OSA intolerant of CPAP, migraines, and myasthenia gravis diagnosed in 1997 status post thymectomy who is chronically on prednisone 10 mg, azathioprine 150 mg, and Mestinon 60 mg 4 times daily.  She presented to [facility] with a 3-week history of gradually progressive generalized weakness, shortness of breath, double vision, difficulty with mastication, and chest pressure.  She states the symptoms are all consistent with her previous myasthenia flares.  Of note she was scheduled to have an IVIG infusion on 8/4.  In the ER she was evaluated by neurology who felt that she was in fact suffering a myasthenic crisis.  Plasma exchange has been ordered.  Consultants:  Neurology  Goals of Care:  Code Status: Full Code   DVT prophylaxis: Subcu heparin  Interim Hx: All events and numerous notes detailing these events since my last visit have been reviewed.  Patient complained of pain last night/this morning and was dosed with 15 mg oxycodone according to her home regimen.  She was also given Xanax 1 mg, again according to her home regimen.  Additionally, she was given '100mg'$  of Seroquel last night (though her home med list reports her usual dose as '400mg'$ ). As the morning progressed the patient became more lethargic and then fully unresponsive.  She began to develop hypotension.  I was called to the room, as well as the Neurology team as well as Rapid Response.  Patient was given a saline bolus of NS and a dose of Narcan.  Within minutes of the Narcan dose the patient's level of alertness had improved dramatically.  She was opening her eyes and interacting with the examiner.  Blood pressure also improved rapidly.  I discussed narcotic dosing with her and she confirmed to me on more than one occasion that she  takes oxycodone 15 mg 4 times a day religiously at home.  She also reported taking Xanax 1 mg 4 times a day at home on a daily basis.  We did confirmed via PDMP that both of these medications are prescribed for her at those doses, and that she has been filling the prescriptions.  Nonetheless the patient clearly had an overexuberant response to narcotic dosing today.  Since my initial visit I have returned to her room 2 additional times.  Within approximately 2-1/2 hours of her initial Narcan dose she had begun to become very lethargic again, and was beginning to exhibit some hypotension again.  She was given a second dose of Narcan and another NS bolus, and with this, as before, rapidly improved.  I provided her with an update on her condition and explained that I was concerned that the oxycodone dose was too strong for her.  She expressed her opinion to me that that was not the case and that she takes oxycodone at this dose at home regularly.  Nonetheless I explained to her that the fact that she improved dramatically from a clinical standpoint with each dose of Narcan made me feel that it was no longer safe to prescribe this medication for her at this time. Especially in the setting of an active Myasthenia flair. Fortunately she did not develop hypoxic resp failure, and a VBG noted only modest CO2 retention, with significant improved ventilation on exam following narcan dosing.   Assessment & Plan:  Acute myasthenic crisis NIF and  VC variable mostly due to intermittent participation from patient -no clinical evidence of respiratory distress even this morning in setting of excessive narcotic somnolence - plasmaphoresis under direction of Neurology with plan for treatments QOD x5 sessions - avoid medications that may worsen or trigger MG exacerbation: Class IA antiarrhythmics, magnesium, flouroquinolones, macrolides, aminoglycosides, penicillamine, curare, interferon alpha, botox, quinine. Use with caution:  calcium channel blocker, beta blockers and statins  Narcotic/multidrug induced obtundation Patient is insistent that she takes oxycodone 15 mg 4 times a day at home on a regular basis -but clinically she clearly cannot tolerate this dose at this time and therefore I am stopping oxycodone for now -it should be avoided during this hospital stay and should not be prescribed at time of discharge due to an extreme risk of respiratory and cardiovascular collapse -I will hold other sedating medicines tonight to include Seroquel -review of records does suggest that she is on chronic benzodiazepines and out of fear of inducing acute withdrawal I will not stop this altogether but will reduce the dose severely and not resume dosing until this evening when the narcotic should be sufficiently metabolized from her system - do not give extra narcotic or benzo doses over night   Anxiety disorder / bipolar 1 disorder Seroquel dose to be held tonight and then resumed at lower dose if mental status stable  DM2 CBG presently well controlled  HTN Blood pressure well controlled at present, with occasional episodes of modest hypotension which have been well-tolerated as discussed above  Normocytic anemia Check anemia panel in a.m.  Asthma No acute exacerbation  Hypothyroidism Continue usual Synthroid dose  OSA Reports being intolerant to CPAP   Family Communication: Spoke with sister at bedside Disposition: From home - eventual discharge home -patient is interested in investigating the option of outpatient plasmapheresis   Objective: Blood pressure 103/68, pulse 100, temperature 98.4 F (36.9 C), temperature source Oral, resp. rate 16, height '5\' 6"'$  (1.676 m), weight 79.2 kg, last menstrual period 10/13/2021, SpO2 100 %.  Intake/Output Summary (Last 24 hours) at 10/22/2021 1541 Last data filed at 10/22/2021 0325 Gross per 24 hour  Intake 721.96 ml  Output --  Net 721.96 ml    Filed Weights   10/22/21  0619  Weight: 79.2 kg    Examination: General: No acute respiratory distress Chest: Hemodialysis catheter in right chest wall with very minimal blood staining of dressing but otherwise benign in appearance Lungs: Good air movement throughout all fields even when obtunded but then greatly improved after Narcan dosing Cardiovascular: Regular rate and rhythm without murmur gallop or rub normal S1 and S2 Abdomen: NT/ND, soft, BS positive Extremities: No significant cyanosis, clubbing, or edema bilateral lower extremities   CBC: Recent Labs  Lab 10/20/21 1748 10/21/21 0510 10/21/21 0800 10/22/21 0544  WBC 7.2  --  4.4 6.2  NEUTROABS 4.5  --   --   --   HGB 11.4* 10.9* 10.0* 10.5*  HCT 36.8 32.0* 33.3* 34.7*  MCV 84.6  --  86.9 87.2  PLT 251  --  201 834    Basic Metabolic Panel: Recent Labs  Lab 10/20/21 1748 10/21/21 0510 10/21/21 0800 10/22/21 0544  NA 138 141 140 138  K 3.3* 3.3* 3.7 4.0  CL 99  --  106 102  CO2 31  --  26 28  GLUCOSE 92  --  86 115*  BUN 15  --  10 9  CREATININE 0.62  --  0.56 0.55  CALCIUM 9.3  --  8.2* 8.4*  MG  --   --  1.7  --     GFR: Estimated Creatinine Clearance: 93.4 mL/min (by C-G formula based on SCr of 0.55 mg/dL).  Liver Function Tests: Recent Labs  Lab 10/20/21 1748 10/21/21 0800  AST 11* 14*  ALT 8 12  ALKPHOS 65 53  BILITOT 0.3 0.6  PROT 6.9 5.8*  ALBUMIN 3.8 2.8*     HbA1C: Hgb A1c MFr Bld  Date/Time Value Ref Range Status  08/14/2020 02:32 PM 5.2 4.8 - 5.6 % Final    Comment:    (NOTE)         Prediabetes: 5.7 - 6.4         Diabetes: >6.4         Glycemic control for adults with diabetes: <7.0   07/09/2018 02:55 PM 6.9 (H) 4.6 - 6.5 % Final    Comment:    Glycemic Control Guidelines for People with Diabetes:Non Diabetic:  <6%Goal of Therapy: <7%Additional Action Suggested:  >8%     Scheduled Meds:  azaTHIOprine  150 mg Oral q AM   Chlorhexidine Gluconate Cloth  6 each Topical Daily   DULoxetine  60 mg  Oral Daily   gabapentin  300 mg Oral QHS   heparin  5,000 Units Subcutaneous Q8H   heparin sodium (porcine)  1,000 Units Intracatheter Once   insulin aspart  0-9 Units Subcutaneous TID WC   levothyroxine  175 mcg Oral Daily   mometasone-formoterol  2 puff Inhalation BID   naLOXone (NARCAN)  injection  0.4 mg Intravenous Q2H   pantoprazole  40 mg Oral Daily   predniSONE  10 mg Oral Q breakfast   pyridostigmine  60 mg Oral QID   topiramate  200 mg Oral Q1500   traZODone  50 mg Oral QHS   Continuous Infusions:  sodium chloride 125 mL/hr at 10/22/21 1145   albumin human 25 % 50 g in sodium chloride 0.9 %     calcium gluconate     citrate dextrose       LOS: 1 day   Cherene Altes, MD [facility] Office  [phone] Pager - Text Page per Shea Evans  If 7PM-7AM, please contact night-coverage per Amion 10/22/2021, 3:41 PM
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
## 2021-10-22 NOTE — Progress Notes (Signed)
NIF -20 VC 600 ml  Best efforts reported. Of note, patient was complaining of being tired and pain due to catheter being recently placed.

## 2021-10-22 NOTE — Progress Notes (Signed)
Pt became lethargic again and difficult to arouse.  Second dose of narcan administered per MD order.  Pt responded well and much more interactive and alert currently  Pt is A&O X4.

## 2021-10-22 NOTE — Progress Notes (Signed)
Pt does not want to be awakened to do NIF/VC. RT monitored pt, VS stable. RT notified RN.

## 2021-10-22 NOTE — Progress Notes (Signed)
Pt was fully conversational and interactive this morning at 0730-0800.  Later this morning Pt became very lethargic and difficult to arouse. Vitals checked Hospitalist MD, neuro MD, and rapid response notified.  Rapid and neuro in room.  Pt currently receiving NS bolus.

## 2021-10-23 ENCOUNTER — Inpatient Hospital Stay (HOSPITAL_COMMUNITY): Payer: Medicare Other

## 2021-10-23 ENCOUNTER — Other Ambulatory Visit: Payer: Medicare Other

## 2021-10-23 DIAGNOSIS — R0602 Shortness of breath: Secondary | ICD-10-CM

## 2021-10-23 DIAGNOSIS — R531 Weakness: Secondary | ICD-10-CM | POA: Diagnosis not present

## 2021-10-23 DIAGNOSIS — G7001 Myasthenia gravis with (acute) exacerbation: Secondary | ICD-10-CM | POA: Diagnosis not present

## 2021-10-23 DIAGNOSIS — R609 Edema, unspecified: Secondary | ICD-10-CM

## 2021-10-23 DIAGNOSIS — R1310 Dysphagia, unspecified: Secondary | ICD-10-CM | POA: Diagnosis not present

## 2021-10-23 LAB — FERRITIN: Ferritin: 81 ng/mL (ref 11–307)

## 2021-10-23 LAB — COMPREHENSIVE METABOLIC PANEL
ALT: 12 U/L (ref 0–44)
AST: 12 U/L — ABNORMAL LOW (ref 15–41)
Albumin: 2.3 g/dL — ABNORMAL LOW (ref 3.5–5.0)
Alkaline Phosphatase: 52 U/L (ref 38–126)
Anion gap: 5 (ref 5–15)
BUN: 8 mg/dL (ref 6–20)
CO2: 23 mmol/L (ref 22–32)
Calcium: 7.9 mg/dL — ABNORMAL LOW (ref 8.9–10.3)
Chloride: 110 mmol/L (ref 98–111)
Creatinine, Ser: 0.53 mg/dL (ref 0.44–1.00)
GFR, Estimated: >60 mL/min (ref >60)
Glucose, Bld: 104 mg/dL — ABNORMAL HIGH (ref 70–99)
Potassium: 3.6 mmol/L (ref 3.5–5.1)
Sodium: 138 mmol/L (ref 135–145)
Total Bilirubin: 0.5 mg/dL (ref 0.3–1.2)
Total Protein: 5.3 g/dL — ABNORMAL LOW (ref 6.5–8.1)

## 2021-10-23 LAB — GLUCOSE, CAPILLARY
Glucose-Capillary: 95 mg/dL (ref 70–99)
Glucose-Capillary: 112 mg/dL — ABNORMAL HIGH (ref 70–99)
Glucose-Capillary: 211 mg/dL — ABNORMAL HIGH (ref 70–99)

## 2021-10-23 LAB — CBC
HCT: 30.7 % — ABNORMAL LOW (ref 36.0–46.0)
Hemoglobin: 9.2 g/dL — ABNORMAL LOW (ref 12.0–15.0)
MCH: 25.8 pg — ABNORMAL LOW (ref 26.0–34.0)
MCHC: 30 g/dL (ref 30.0–36.0)
MCV: 86.2 fL (ref 80.0–100.0)
Platelets: 194 10*3/uL (ref 150–400)
RBC: 3.56 MIL/uL — ABNORMAL LOW (ref 3.87–5.11)
RDW: 12.7 % (ref 11.5–15.5)
WBC: 6.4 10*3/uL (ref 4.0–10.5)
nRBC: 0 % (ref 0.0–0.2)

## 2021-10-23 LAB — AMMONIA: Ammonia: 29 umol/L (ref 9–35)

## 2021-10-23 LAB — IRON AND TIBC
Iron: 95 ug/dL (ref 28–170)
Saturation Ratios: 48 % — ABNORMAL HIGH (ref 10.4–31.8)
TIBC: 200 ug/dL — ABNORMAL LOW (ref 250–450)
UIBC: 105 ug/dL

## 2021-10-23 LAB — VITAMIN B12: Vitamin B-12: 543 pg/mL (ref 180–914)

## 2021-10-23 LAB — RETICULOCYTES
Immature Retic Fract: 7.6 % (ref 2.3–15.9)
RBC.: 3.48 MIL/uL — ABNORMAL LOW (ref 3.87–5.11)
Retic Count, Absolute: 49.8 10*3/uL (ref 19.0–186.0)
Retic Ct Pct: 1.4 % (ref 0.4–3.1)

## 2021-10-23 LAB — FOLATE: Folate: 14 ng/mL (ref >5.9)

## 2021-10-23 MED ORDER — SODIUM CHLORIDE 0.9 % IV BOLUS
500.0000 mL | Freq: Once | INTRAVENOUS | Status: AC
  Administered 2021-10-24: 500 mL via INTRAVENOUS

## 2021-10-23 NOTE — Progress Notes (Signed)
PT performed NIF -18 and VC 799m with great effort.

## 2021-10-23 NOTE — Progress Notes (Signed)
Pt's right arm is now very swollen, taunt, and painful.  IV removed. MD notified.

## 2021-10-23 NOTE — Progress Notes (Signed)
Pt performed Nif -15 and VC 761m with good effort.

## 2021-10-23 NOTE — Progress Notes (Signed)
RUE venous duplex has been completed.   Results can be found under chart review under CV PROC. 10/23/2021 12:15 PM Evert Wenrich RVT, RDMS

## 2021-10-23 NOTE — Progress Notes (Addendum)
Pt performed NIF -26 VC .9L with good pt effort .
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
## 2021-10-23 NOTE — Progress Notes (Signed)
Pt stated she did not want to do NIF/VC, she stated she wanted to rest

## 2021-10-24 ENCOUNTER — Ambulatory Visit: Payer: Medicare Other | Admitting: Orthopedic Surgery

## 2021-10-24 ENCOUNTER — Ambulatory Visit: Payer: Medicare Other | Admitting: Physical Therapy

## 2021-10-24 DIAGNOSIS — G7001 Myasthenia gravis with (acute) exacerbation: Secondary | ICD-10-CM | POA: Diagnosis not present

## 2021-10-24 LAB — BASIC METABOLIC PANEL
Anion gap: 10 (ref 5–15)
Anion gap: 3 — ABNORMAL LOW (ref 5–15)
BUN: 6 mg/dL (ref 6–20)
BUN: 8 mg/dL (ref 6–20)
CO2: 21 mmol/L — ABNORMAL LOW (ref 22–32)
CO2: 21 mmol/L — ABNORMAL LOW (ref 22–32)
Calcium: 8.2 mg/dL — ABNORMAL LOW (ref 8.9–10.3)
Calcium: 8.4 mg/dL — ABNORMAL LOW (ref 8.9–10.3)
Chloride: 112 mmol/L — ABNORMAL HIGH (ref 98–111)
Chloride: 114 mmol/L — ABNORMAL HIGH (ref 98–111)
Creatinine, Ser: 0.58 mg/dL (ref 0.44–1.00)
Creatinine, Ser: 0.6 mg/dL (ref 0.44–1.00)
GFR, Estimated: >60 mL/min (ref >60)
GFR, Estimated: >60 mL/min (ref >60)
Glucose, Bld: 139 mg/dL — ABNORMAL HIGH (ref 70–99)
Glucose, Bld: 126 mg/dL — ABNORMAL HIGH (ref 70–99)
Potassium: 3.4 mmol/L — ABNORMAL LOW (ref 3.5–5.1)
Potassium: 4.2 mmol/L (ref 3.5–5.1)
Sodium: 143 mmol/L (ref 135–145)
Sodium: 138 mmol/L (ref 135–145)

## 2021-10-24 LAB — CBC
HCT: 32.6 % — ABNORMAL LOW (ref 36.0–46.0)
Hemoglobin: 9.5 g/dL — ABNORMAL LOW (ref 12.0–15.0)
MCH: 26 pg (ref 26.0–34.0)
MCHC: 29.1 g/dL — ABNORMAL LOW (ref 30.0–36.0)
MCV: 89.3 fL (ref 80.0–100.0)
Platelets: 208 10*3/uL (ref 150–400)
RBC: 3.65 MIL/uL — ABNORMAL LOW (ref 3.87–5.11)
RDW: 12.9 % (ref 11.5–15.5)
WBC: 6.3 10*3/uL (ref 4.0–10.5)
nRBC: 0 % (ref 0.0–0.2)

## 2021-10-24 LAB — GLUCOSE, CAPILLARY
Glucose-Capillary: 174 mg/dL — ABNORMAL HIGH (ref 70–99)
Glucose-Capillary: 122 mg/dL — ABNORMAL HIGH (ref 70–99)
Glucose-Capillary: 142 mg/dL — ABNORMAL HIGH (ref 70–99)
Glucose-Capillary: 129 mg/dL — ABNORMAL HIGH (ref 70–99)

## 2021-10-24 LAB — CORTISOL: Cortisol, Plasma: 5.7 ug/dL

## 2021-10-24 MED ORDER — DIPHENHYDRAMINE HCL 25 MG PO CAPS
25.0000 mg | ORAL_CAPSULE | Freq: Four times a day (QID) | ORAL | Status: DC | PRN
Start: 2021-10-24 — End: 2021-10-25

## 2021-10-24 MED ORDER — HEPARIN SODIUM (PORCINE) 1000 UNIT/ML IJ SOLN
1000.0000 [IU] | Freq: Once | INTRAMUSCULAR | Status: AC
  Administered 2021-10-25: 3200 [IU]

## 2021-10-24 MED ORDER — ACD FORMULA A 0.73-2.45-2.2 GM/100ML VI SOLN
1000.0000 mL | Status: DC
  Administered 2021-10-24: 1000 mL

## 2021-10-24 MED ORDER — ACETAMINOPHEN 325 MG PO TABS: 650.0000 mg | ORAL_TABLET | ORAL | Status: DC | PRN

## 2021-10-24 MED ORDER — SODIUM CHLORIDE 0.9 % IV SOLN
INTRAVENOUS | Status: AC
  Filled 2021-10-24 (×3): qty 200

## 2021-10-24 MED ORDER — CALCIUM GLUCONATE-NACL 2-0.675 GM/100ML-% IV SOLN
2.0000 g | Freq: Once | INTRAVENOUS | Status: AC
  Administered 2021-10-24: 2000 mg via INTRAVENOUS
  Filled 2021-10-24: qty 100

## 2021-10-24 MED ORDER — HEPARIN SODIUM (PORCINE) 1000 UNIT/ML IJ SOLN
INTRAMUSCULAR | Status: AC
  Filled 2021-10-24: qty 3

## 2021-10-24 MED ORDER — CALCIUM CARBONATE ANTACID 500 MG PO CHEW
2.0000 | CHEWABLE_TABLET | ORAL | Status: AC
  Administered 2021-10-24: 400 mg via ORAL
  Filled 2021-10-24: qty 2

## 2021-10-24 NOTE — Progress Notes (Signed)
Report given to dialysis RN for plasma treatment.
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
## 2021-10-24 NOTE — Progress Notes (Signed)
Pt did NIF: -22 VC: 1.2L with food effort.

## 2021-10-24 NOTE — Progress Notes (Signed)
Pt stated she did not want to be bother with NIF/VC parameters at 0200. Pt stated she wanted to rest.

## 2021-10-24 NOTE — Progress Notes (Signed)
Jade Mathis  OHY:[id number] DOB: 14-Aug-1975 DOA: 10/20/2021 PCP: Hoyt Koch, MD    Brief Narrative:  46 year old with a history of anxiety, asthma, bipolar 1 disorder, DM2, GERD, OSA intolerant of CPAP, migraines, and myasthenia gravis diagnosed in 1997 status post thymectomy who is chronically on prednisone 10 mg, azathioprine 150 mg, and Mestinon 60 mg 4 times daily.  She presented to [facility] with a 3-week history of gradually progressive generalized weakness, shortness of breath, double vision, difficulty with mastication, and chest pressure.  She states the symptoms are all consistent with her previous myasthenia flares.  Of note she was scheduled to have an IVIG infusion on 8/4.  In the ER she was evaluated by neurology who felt that she was in fact suffering a myasthenic crisis.  Plasma exchange has been ordered.  Consultants:  Neurology  Goals of Care:  Code Status: Full Code   DVT prophylaxis: Subcu heparin  Interim Hx: Patient refused NIF/VC again this morning.  Afebrile.  Blood pressure remains marginal with systolics consistently in the mid 80s.  Patient is not tachycardic.  I spoke with her to assure that she was not taking home prescription or over-the-counter meds while she is in the hospital and she confirmed to me that she is not.  Assessment & Plan:  Acute myasthenic crisis NIF and VC variable mostly due to intermittent participation from patient, but trend overall has been one of improvement with stable respiratory status - plasmaphoresis under direction of Neurology with plan to initiate therapy 8/7 - avoid medications that may worsen or trigger MG exacerbation: Class IA antiarrhythmics, magnesium, flouroquinolones, macrolides, aminoglycosides, penicillamine, curare, interferon alpha, botox, quinine. Use with caution: calcium channel blocker, beta blockers and statins  Narcotic/multidrug induced obtundation 10/22/21 Patient is insistent that  she takes oxycodone '15mg'$  4 times a day at home on a regular basis - clinically she clearly cannot tolerate this dose at this time and therefore I have stopped oxycodone for now -it should be avoided during this hospital stay and should not be prescribed at time of discharge due to an extreme risk of respiratory and cardiovascular collapse - review of records does suggest that she is on chronic benzodiazepines but in the setting of her ongoing hypotension I have not felt it safe to resume xanax - thus far she has exhibited no sign of withdrawal - do not give extra narcotic or benzo doses over night - I spoke with patient to assure she was not taking medications from her home supply and she denies doing so  Persistent hypotension Review of patient's blood pressure trend since admission notes systolics in the 948-546 range at the time of her presentation, but a more consistent range of 95-105 since that time -she has experienced intermittent dips into the mid 80s on multiple days since her admission -review of outpatient records suggest blood pressure routinely 270 systolic - cont to follow trend for now, and avoid any medication which could drop her BP  Anxiety disorder / bipolar 1 disorder Resume Seroquel at lower dose once patient more consistently alert and interactive - have not yet been able to resume low dose benzo due to consistent hypotension   Right upper extremity edema 1+ diffuse right upper extremity edema appreciated 8/6 - appears to be due to saline infiltration of IV versus some venous congestion related to right chest catheter -venous duplex negative for DVT - much improved on exam today   DM2 CBG well controlled  HTN Not  an active problem at this time   Normocytic anemia Anemia panel consistent with anemia of chronic disease -hemoglobin holding steady  Asthma Well compensated at present  Hypothyroidism Continue usual Synthroid dose  OSA Reports being intolerant to  CPAP   Family Communication: No family present at time of exam today Disposition: From home - eventual discharge home   Objective: Blood pressure (!) 92/53, pulse 80, temperature 98.5 F (36.9 C), temperature source Oral, resp. rate 20, height '5\' 6"'$  (1.676 m), weight 79.2 kg, last menstrual period 10/13/2021, SpO2 100 %.  Intake/Output Summary (Last 24 hours) at 10/24/2021 1516 Last data filed at 10/24/2021 0644 Gross per 24 hour  Intake 2020 ml  Output 250 ml  Net 1770 ml   Filed Weights   10/22/21 0619  Weight: 79.2 kg    Examination: General: No acute respiratory distress Lungs: Clear to auscultation B - no wheezing and good air movement in all fields  Cardiovascular: RRR - no M or rub  Abdomen: NT/ND, soft, BS positive Extremities: No significant C/C/E B LE    CBC: Recent Labs  Lab 10/20/21 1748 10/21/21 0510 10/22/21 0544 10/23/21 0716 10/24/21 1243  WBC 7.2   < > 6.2 6.4 6.3  NEUTROABS 4.5  --   --   --   --   HGB 11.4*   < > 10.5* 9.2* 9.5*  HCT 36.8   < > 34.7* 30.7* 32.6*  MCV 84.6   < > 87.2 86.2 89.3  PLT 251   < > 211 194 208   < > = values in this interval not displayed.   Basic Metabolic Panel: Recent Labs  Lab 10/21/21 0800 10/22/21 0544 10/23/21 0716 10/24/21 1243  NA 140 138 138 143  K 3.7 4.0 3.6 3.4*  CL 106 102 110 112*  CO2 '26 28 23 '$ 21*  GLUCOSE 86 115* 104* 139*  BUN '10 9 8 6  '$ CREATININE 0.56 0.55 0.53 0.58  CALCIUM 8.2* 8.4* 7.9* 8.2*  MG 1.7  --   --   --    GFR: Estimated Creatinine Clearance: 93.4 mL/min (by C-G formula based on SCr of 0.58 mg/dL).  Liver Function Tests: Recent Labs  Lab 10/20/21 1748 10/21/21 0800 10/23/21 0716  AST 11* 14* 12*  ALT '8 12 12  '$ ALKPHOS 65 53 52  BILITOT 0.3 0.6 0.5  PROT 6.9 5.8* 5.3*  ALBUMIN 3.8 2.8* 2.3*    HbA1C: Hgb A1c MFr Bld  Date/Time Value Ref Range Status  08/14/2020 02:32 PM 5.2 4.8 - 5.6 % Final    Comment:    (NOTE)         Prediabetes: 5.7 - 6.4          Diabetes: >6.4         Glycemic control for adults with diabetes: <7.0   07/09/2018 02:55 PM 6.9 (H) 4.6 - 6.5 % Final    Comment:    Glycemic Control Guidelines for People with Diabetes:Non Diabetic:  <6%Goal of Therapy: <7%Additional Action Suggested:  >8%     Scheduled Meds:  azaTHIOprine  150 mg Oral q AM   Chlorhexidine Gluconate Cloth  6 each Topical Daily   DULoxetine  60 mg Oral Daily   heparin  5,000 Units Subcutaneous Q8H   heparin sodium (porcine)  1,000 Units Intracatheter Once   insulin aspart  0-9 Units Subcutaneous TID WC   levothyroxine  175 mcg Oral Daily   mometasone-formoterol  2 puff Inhalation BID   pantoprazole  40 mg Oral Daily   predniSONE  10 mg Oral Q breakfast   pyridostigmine  60 mg Oral QID   topiramate  200 mg Oral Q1500   traZODone  50 mg Oral QHS   Continuous Infusions:  sodium chloride 75 mL/hr at 10/24/21 1156   albumin human 25 % 50 g in sodium chloride 0.9 %     calcium gluconate     citrate dextrose       LOS: 3 days   Cherene Altes, MD [facility] Office  [phone] Pager - Text Page per Shea Evans  If 7PM-7AM, please contact night-coverage per Amion 10/24/2021, 3:16 PM

## 2021-10-24 NOTE — Progress Notes (Signed)
Vc: 1.48L  NIF: -20   With good patient effort.

## 2021-10-24 NOTE — Progress Notes (Signed)
Patient is refusing NIF / VC at this time. She states "I'm not doing anything else until they draw my blood."

## 2021-10-25 DIAGNOSIS — G7001 Myasthenia gravis with (acute) exacerbation: Secondary | ICD-10-CM | POA: Diagnosis not present

## 2021-10-25 LAB — CBC
HCT: 29.9 % — ABNORMAL LOW (ref 36.0–46.0)
Hemoglobin: 9 g/dL — ABNORMAL LOW (ref 12.0–15.0)
MCH: 26.2 pg (ref 26.0–34.0)
MCHC: 30.1 g/dL (ref 30.0–36.0)
MCV: 87.2 fL (ref 80.0–100.0)
Platelets: 177 10*3/uL (ref 150–400)
RBC: 3.43 MIL/uL — ABNORMAL LOW (ref 3.87–5.11)
RDW: 13.2 % (ref 11.5–15.5)
WBC: 6.6 10*3/uL (ref 4.0–10.5)
nRBC: 0 % (ref 0.0–0.2)

## 2021-10-25 LAB — COMPREHENSIVE METABOLIC PANEL
ALT: 7 U/L (ref 0–44)
AST: 8 U/L — ABNORMAL LOW (ref 15–41)
Albumin: 3.4 g/dL — ABNORMAL LOW (ref 3.5–5.0)
Alkaline Phosphatase: 21 U/L — ABNORMAL LOW (ref 38–126)
Anion gap: 4 — ABNORMAL LOW (ref 5–15)
BUN: 10 mg/dL (ref 6–20)
CO2: 19 mmol/L — ABNORMAL LOW (ref 22–32)
Calcium: 8.2 mg/dL — ABNORMAL LOW (ref 8.9–10.3)
Chloride: 118 mmol/L — ABNORMAL HIGH (ref 98–111)
Creatinine, Ser: 0.5 mg/dL (ref 0.44–1.00)
GFR, Estimated: >60 mL/min (ref >60)
Glucose, Bld: 125 mg/dL — ABNORMAL HIGH (ref 70–99)
Potassium: 3.6 mmol/L (ref 3.5–5.1)
Sodium: 141 mmol/L (ref 135–145)
Total Bilirubin: 0.4 mg/dL (ref 0.3–1.2)
Total Protein: 4.7 g/dL — ABNORMAL LOW (ref 6.5–8.1)

## 2021-10-25 LAB — GLUCOSE, CAPILLARY
Glucose-Capillary: 98 mg/dL (ref 70–99)
Glucose-Capillary: 122 mg/dL — ABNORMAL HIGH (ref 70–99)
Glucose-Capillary: 141 mg/dL — ABNORMAL HIGH (ref 70–99)
Glucose-Capillary: 137 mg/dL — ABNORMAL HIGH (ref 70–99)

## 2021-10-25 NOTE — Progress Notes (Signed)
NIF: -10 VC: 559m  Please note that this was with very poor patient effort despite attempts to get patient to preform better.

## 2021-10-25 NOTE — Plan of Care (Signed)
°  Problem: Education: °Goal: Knowledge of General Education information will improve °Description: Including pain rating scale, medication(s)/side effects and non-pharmacologic comfort measures °Outcome: Progressing °  °Problem: Health Behavior/Discharge Planning: °Goal: Ability to manage health-related needs will improve °Outcome: Progressing °  °Problem: Clinical Measurements: °Goal: Ability to maintain clinical measurements within normal limits will improve °Outcome: Progressing °Goal: Will remain free from infection °Outcome: Progressing °Goal: Diagnostic test results will improve °Outcome: Progressing °Goal: Respiratory complications will improve °Outcome: Progressing °Goal: Cardiovascular complication will be avoided °Outcome: Progressing °  °Problem: Safety: °Goal: Ability to remain free from injury will improve °Outcome: Progressing °  °Problem: Skin Integrity: °Goal: Risk for impaired skin integrity will decrease °Outcome: Progressing °  °

## 2021-10-25 NOTE — Progress Notes (Signed)
Jade Mathis  ZCH:[id number] DOB: 11-17-1975 DOA: 10/20/2021 PCP: Hoyt Koch, MD    Brief Narrative:  46 year old with a history of anxiety, asthma, bipolar 1 disorder, DM2, GERD, OSA intolerant of CPAP, migraines, and myasthenia gravis diagnosed in 1997 status post thymectomy who is chronically on prednisone 10 mg, azathioprine 150 mg, and Mestinon 60 mg 4 times daily.  She presented to [facility] with a 3-week history of gradually progressive generalized weakness, shortness of breath, double vision, difficulty with mastication, and chest pressure.  She states the symptoms are all consistent with her previous myasthenia flares.  Of note she was scheduled to have an IVIG infusion on 8/4.  In the ER she was evaluated by neurology who felt that she was in fact suffering a myasthenic crisis.  Plasma exchange has been ordered.  Consultants:  Neurology  Goals of Care:  Code Status: Full Code   DVT prophylaxis: Subcu heparin  Interim Hx: Patient underwent her first plasmapheresis treatment last night.  Blood pressure trended upward during the night, coincident with treatment.  Afebrile.  Saturations stable.  I came to the room to visit the patient but she was sleeping soundly.  She was in no respiratory distress whatsoever.  She appeared comfortable.  Given that she had a long night in the dialysis unit I chose not to wake her up.  I did stand at the foot of the bed for ~30 seconds to observe her respiratory pattern.  Assessment & Plan:  Acute myasthenic crisis NIF and VC variable mostly due to intermittent participation from patient, but trend overall has been one of improvement with stable respiratory status - plasmaphoresis under direction of Neurology with therapy initiated 8/7 PM - avoid medications that may worsen or trigger MG exacerbation: Class IA antiarrhythmics, magnesium, flouroquinolones, macrolides, aminoglycosides, penicillamine, curare, interferon alpha,  botox, quinine. Use with caution: calcium channel blocker, beta blockers and statins  Narcotic/multidrug induced obtundation 10/22/21 Patient was insistent that she takes oxycodone '15mg'$  4 times a day at home on a regular basis - clinically she clearly cannot tolerate this dose at this time as evidenced by obtundation and respiratory suppression with hypotension and therefore I have stopped oxycodone - it should be avoided during this hospital stay and should not be prescribed at time of discharge due to an extreme risk of respiratory and cardiovascular collapse - review of records does suggest that she is on chronic benzodiazepines but in the setting of her ongoing hypotension I have not felt it safe to resume xanax - thus far she has exhibited no sign of withdrawal - do not give narcotic or benzo doses over night - I spoke with patient 8/7 to assure she was not taking medications from her home supply and she denies doing so  Persistent hypotension Review of patient's blood pressure trend since admission notes systolics in the 287-867 range at the time of her presentation, but a more consistent range of 95-105 since that time -she has experienced intermittent dips into the mid 80s on multiple days since her admission -review of outpatient records suggest blood pressure routinely 672 systolic - cont to follow trend for now, and avoid any medication which could drop her BP -blood pressure appears to have stabilized but remains frequently low with systolics in the mid 09O  Anxiety disorder / bipolar 1 disorder Have not yet resumed Seroquel or benzo as patient continues to have moderate hypotension and intermittent episodes of somnolence -no evidence of benzodiazepine withdrawal and patient  has now been in the hospital for 4+ days  Right upper extremity edema 1+ diffuse right upper extremity edema appreciated 8/6 - appears to be due to saline infiltration of IV versus some venous congestion related to right  chest catheter -venous duplex negative for DVT - much improved on follow-up exam  DM2 CBG well controlled  HTN Not an active problem at this time   Normocytic anemia Anemia panel consistent with anemia of chronic disease -hemoglobin holding steady  Asthma Well compensated at present  Hypothyroidism Continue usual Synthroid dose  OSA Reports being intolerant to CPAP   Family Communication: No family present at time of visit Disposition: From home - eventual discharge home   Objective: Blood pressure (!) 87/47, pulse 88, temperature 97.9 F (36.6 C), temperature source Oral, resp. rate 20, height '5\' 6"'$  (1.676 m), weight 79.2 kg, last menstrual period 10/13/2021, SpO2 96 %.  Intake/Output Summary (Last 24 hours) at 10/25/2021 0830 Last data filed at 10/25/2021 0300 Gross per 24 hour  Intake 2826.87 ml  Output --  Net 2826.87 ml    Filed Weights   10/22/21 0619  Weight: 79.2 kg    Examination: General: No acute respiratory distress Lungs: respirations calm and comfortable with equal normal appearing chest excursion B   CBC: Recent Labs  Lab 10/20/21 1748 10/21/21 0510 10/23/21 0716 10/24/21 1243 10/25/21 0331  WBC 7.2   < > 6.4 6.3 6.6  NEUTROABS 4.5  --   --   --   --   HGB 11.4*   < > 9.2* 9.5* 9.0*  HCT 36.8   < > 30.7* 32.6* 29.9*  MCV 84.6   < > 86.2 89.3 87.2  PLT 251   < > 194 208 177   < > = values in this interval not displayed.    Basic Metabolic Panel: Recent Labs  Lab 10/21/21 0800 10/22/21 0544 10/24/21 1243 10/24/21 2108 10/25/21 0331  NA 140   < > 143 138 141  K 3.7   < > 3.4* 4.2 3.6  CL 106   < > 112* 114* 118*  CO2 26   < > 21* 21* 19*  GLUCOSE 86   < > 139* 126* 125*  BUN 10   < > '6 8 10  '$ CREATININE 0.56   < > 0.58 0.60 0.50  CALCIUM 8.2*   < > 8.2* 8.4* 8.2*  MG 1.7  --   --   --   --    < > = values in this interval not displayed.    GFR: Estimated Creatinine Clearance: 93.4 mL/min (by C-G formula based on SCr of 0.5  mg/dL).  Liver Function Tests: Recent Labs  Lab 10/20/21 1748 10/21/21 0800 10/23/21 0716 10/25/21 0331  AST 11* 14* 12* 8*  ALT '8 12 12 7  '$ ALKPHOS 65 53 52 21*  BILITOT 0.3 0.6 0.5 0.4  PROT 6.9 5.8* 5.3* 4.7*  ALBUMIN 3.8 2.8* 2.3* 3.4*     HbA1C: Hgb A1c MFr Bld  Date/Time Value Ref Range Status  08/14/2020 02:32 PM 5.2 4.8 - 5.6 % Final    Comment:    (NOTE)         Prediabetes: 5.7 - 6.4         Diabetes: >6.4         Glycemic control for adults with diabetes: <7.0   07/09/2018 02:55 PM 6.9 (H) 4.6 - 6.5 % Final    Comment:    Glycemic Control Guidelines  for People with Diabetes:Non Diabetic:  <6%Goal of Therapy: <7%Additional Action Suggested:  >8%     Scheduled Meds:  azaTHIOprine  150 mg Oral q AM   Chlorhexidine Gluconate Cloth  6 each Topical Daily   DULoxetine  60 mg Oral Daily   heparin  5,000 Units Subcutaneous Q8H   heparin sodium (porcine)       insulin aspart  0-9 Units Subcutaneous TID WC   levothyroxine  175 mcg Oral Daily   mometasone-formoterol  2 puff Inhalation BID   pantoprazole  40 mg Oral Daily   predniSONE  10 mg Oral Q breakfast   pyridostigmine  60 mg Oral QID   topiramate  200 mg Oral Q1500   traZODone  50 mg Oral QHS   Continuous Infusions:  sodium chloride 75 mL/hr at 10/25/21 0136     LOS: 4 days   Cherene Altes, MD [facility] Office  [phone] Pager - Text Page per Shea Evans  If 7PM-7AM, please contact night-coverage per Amion 10/25/2021, 8:30 AM

## 2021-10-25 NOTE — Plan of Care (Signed)

## 2021-10-25 NOTE — Progress Notes (Signed)
Pt refused to do NIF/VC, pt stated she needs to rest.

## 2021-10-25 NOTE — Progress Notes (Signed)
Patient transferred to dialysis via bed with Transport. Vitals stable at the time of transfer.

## 2021-10-25 NOTE — Progress Notes (Signed)
Patient received back from dialysis, helped her to bedside commode. Pt was upset coming back, asked for her Toradol. Gave her some juice and cracker. Vitals stable. Pt stated " don't bother me with 4am vitals." Care continues.

## 2021-10-25 NOTE — Progress Notes (Signed)
NIF: -20 VC: 656m  With good patient effort.

## 2021-10-25 NOTE — Progress Notes (Signed)
Pt performed NIF -24 VC 1.8L with good pt effort

## 2021-10-26 DIAGNOSIS — E1169 Type 2 diabetes mellitus with other specified complication: Secondary | ICD-10-CM

## 2021-10-26 DIAGNOSIS — E669 Obesity, unspecified: Secondary | ICD-10-CM

## 2021-10-26 DIAGNOSIS — G7001 Myasthenia gravis with (acute) exacerbation: Secondary | ICD-10-CM | POA: Diagnosis not present

## 2021-10-26 LAB — GLUCOSE, CAPILLARY
Glucose-Capillary: 105 mg/dL — ABNORMAL HIGH (ref 70–99)
Glucose-Capillary: 154 mg/dL — ABNORMAL HIGH (ref 70–99)
Glucose-Capillary: 177 mg/dL — ABNORMAL HIGH (ref 70–99)
Glucose-Capillary: 162 mg/dL — ABNORMAL HIGH (ref 70–99)

## 2021-10-26 LAB — RENAL FUNCTION PANEL
Albumin: 3.2 g/dL — ABNORMAL LOW (ref 3.5–5.0)
Anion gap: 5 (ref 5–15)
BUN: 13 mg/dL (ref 6–20)
CO2: 20 mmol/L — ABNORMAL LOW (ref 22–32)
Calcium: 8.4 mg/dL — ABNORMAL LOW (ref 8.9–10.3)
Chloride: 116 mmol/L — ABNORMAL HIGH (ref 98–111)
Creatinine, Ser: 0.6 mg/dL (ref 0.44–1.00)
GFR, Estimated: >60 mL/min (ref >60)
Glucose, Bld: 111 mg/dL — ABNORMAL HIGH (ref 70–99)
Phosphorus: 2.8 mg/dL (ref 2.5–4.6)
Potassium: 3.5 mmol/L (ref 3.5–5.1)
Sodium: 141 mmol/L (ref 135–145)

## 2021-10-26 LAB — CBC
HCT: 29.8 % — ABNORMAL LOW (ref 36.0–46.0)
Hemoglobin: 9.1 g/dL — ABNORMAL LOW (ref 12.0–15.0)
MCH: 26.5 pg (ref 26.0–34.0)
MCHC: 30.5 g/dL (ref 30.0–36.0)
MCV: 86.9 fL (ref 80.0–100.0)
Platelets: 185 10*3/uL (ref 150–400)
RBC: 3.43 MIL/uL — ABNORMAL LOW (ref 3.87–5.11)
RDW: 13.8 % (ref 11.5–15.5)
WBC: 7.6 10*3/uL (ref 4.0–10.5)
nRBC: 0.3 % — ABNORMAL HIGH (ref 0.0–0.2)

## 2021-10-26 MED ORDER — ACETAMINOPHEN 325 MG PO TABS: 650.0000 mg | ORAL_TABLET | ORAL | Status: DC | PRN

## 2021-10-26 MED ORDER — HEPARIN SODIUM (PORCINE) 1000 UNIT/ML IJ SOLN
INTRAMUSCULAR | Status: AC
  Filled 2021-10-26: qty 3

## 2021-10-26 MED ORDER — CALCIUM GLUCONATE-NACL 2-0.675 GM/100ML-% IV SOLN
INTRAVENOUS | Status: AC
  Filled 2021-10-26: qty 100

## 2021-10-26 MED ORDER — CALCIUM GLUCONATE-NACL 2-0.675 GM/100ML-% IV SOLN
2.0000 g | Freq: Once | INTRAVENOUS | Status: AC
Start: 2021-10-26 — End: 2021-10-26
  Administered 2021-10-26: 2000 mg via INTRAVENOUS

## 2021-10-26 MED ORDER — DIPHENHYDRAMINE HCL 25 MG PO CAPS
25.0000 mg | ORAL_CAPSULE | Freq: Four times a day (QID) | ORAL | Status: DC | PRN
Start: 2021-10-26 — End: 2021-10-31
  Administered 2021-10-26 – 2021-10-28 (×2): 25 mg via ORAL
  Filled 2021-10-26 (×3): qty 1

## 2021-10-26 MED ORDER — ACD FORMULA A 0.73-2.45-2.2 GM/100ML VI SOLN
1000.0000 mL | Status: DC
  Administered 2021-10-26: 1000 mL
  Filled 2021-10-26: qty 1000

## 2021-10-26 MED ORDER — POLYETHYLENE GLYCOL 3350 17 G PO PACK
17.0000 g | PACK | Freq: Two times a day (BID) | ORAL | Status: AC
  Administered 2021-10-26 – 2021-10-27 (×3): 17 g via ORAL
  Filled 2021-10-26 (×4): qty 1

## 2021-10-26 MED ORDER — ACD FORMULA A 0.73-2.45-2.2 GM/100ML VI SOLN
Status: AC
  Filled 2021-10-26: qty 1000

## 2021-10-26 MED ORDER — HEPARIN SODIUM (PORCINE) 1000 UNIT/ML IJ SOLN
1000.0000 [IU] | Freq: Once | INTRAMUSCULAR | Status: AC
Start: 2021-10-26 — End: 2021-10-26
  Administered 2021-10-26: 3000 [IU]

## 2021-10-26 MED ORDER — SODIUM CHLORIDE 0.9 % IV SOLN
Freq: Once | INTRAVENOUS | Status: AC
  Filled 2021-10-26 (×6): qty 200

## 2021-10-26 NOTE — Progress Notes (Signed)
Respiratory Therapy NIF-25                                   FVC-1.3L

## 2021-10-26 NOTE — Progress Notes (Signed)
RT went to obtain NIFand VC on pt, pt off floor for treatment. RT will come back for 0200 check.

## 2021-10-26 NOTE — Progress Notes (Signed)
Pt stated she doesn't want to do NIF/VC. Pt states she needs to rest.

## 2021-10-26 NOTE — Progress Notes (Signed)
[facility] PROGRESS NOTE    Progress Note  Lemmie Steinhaus  ASN:[id number] DOB: [birth year] DOA: 10/20/2021 PCP: Hoyt Koch, MD     Brief Narrative:   Jade Mathis is an 46 y.o. 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 on chronic steroids, Azathioprine and Mestinon presents to [place] with history of 3 weeks of gradual onset progressive weakness shortness of breath, double vision, difficulty masticating and chest pressure continues were consistent with myasthenia gravis flare, in the ER she was evaluated by neuro who recommended plasma exchange and admission  Consultant: Neurology Assessment/Plan:   Acute Myasthenia exacerbation [facility]): Undergoing plasmapheresis by neurology initiated on 10/24/2021 every other day for 5 sessions Avoid medications that can trigger myasthenia, quinolones and macrolides quinine and cautions with calcium channel blockers and beta-blockers and statins. Continue NIF and vital capacity every 6 hours. Continue Mestinon, prednisone and azathioprine  Narcotic/multidrug induced obtundation on 07/22/2021: Patient has insisted she takes Oxy 15 mg 4 times a day which she cannot tolerate due to depression of her respiratory status and hypotension.  Therefore oxycodone was stopped. Review of her records has shown she uses chronic benzos, due to her episode of hypotension these were held. Thus far she has exhibited no withdrawal of narcotics or benzos.  Persistent hypotension: Avoid hypertensive medications or medications that can drop her blood pressure. She is currently asymptomatic. ACE inhibitor has been discontinued.  Anxiety disorder/bipolar disorder: Cervical and benzo were held due to intermittent episodes of somnolence and hypotension she has experienced no  withdrawal. Has now been off these medications for 5 days.  Right upper extremity edema: Upper extremity Doppler was negative for DVT appears to be due to saline infiltration improved with elevation.  Diabetes mellitus type 2: Long-acting insulin plus sliding scale blood glucose fairly controlled.  Normocytic anemia: Due to anemia of chronic disease follow-up with PCP as an outpatient.  Asthma: Noted.  Hypothyroidism: Continue Synthroid.   DVT prophylaxis: lovenox Family Communication:none Status is: Inpatient Remains inpatient appropriate because: Acute myasthenia gravis exacerbation.    Code Status:     Code Status Orders  (From admission, onward)           Start     Ordered   10/21/21 0720  Full code  Continuous        10/21/21 0719           Code Status History     Date Active Date Inactive Code Status Order ID Comments User Context   08/26/2021 1729 08/30/2021 2107 Full Code [id number]  Nita Sells, MD ED   08/14/2020 0730 08/16/2020 2321 Full Code [id number]  Norval Morton, MD ED   09/23/2016 1507 09/25/2016 1829 Full Code [id number]  Waldemar Dickens, MD Inpatient   09/22/2016 1017 09/23/2016 1507 Full Code [id number]  Shirley, [place], DO Inpatient   06/13/2016 2254 06/14/2016 2054 Full Code [id number]  Sherlyn Hay, DO Inpatient   11/04/2015 0029 11/04/2015 1232 Full Code [id number]  Tresea Mall, CNM Inpatient   09/10/2014 0011 09/13/2014 1352 Full Code [id number]  Toy Baker, MD Inpatient   10/11/2012 2032 10/12/2012 0321 Full Code [id number]  Montine Circle, PA-C ED         IV Access:   Peripheral IV   Procedures and diagnostic studies:   No results found.   Medical Consultants:   None.  Subjective:    Lyndel Pleasure no complaints 1 to be left alone  Objective:    Vitals:   10/25/21 2358 10/26/21 0632 10/26/21 0755 10/26/21 0823  BP: (!) 83/52 (!) 87/53 (!) 80/51   Pulse: 79 72 72 80  Resp: '19 19  19 14  '$ Temp: 98.4 F (36.9 C) (!) 97.5 F (36.4 C) 98.1 F (36.7 C)   TempSrc: Axillary Oral Oral   SpO2: 99% 100% 99% 100%  Weight:      Height:       SpO2: 100 % O2 Flow Rate (L/min): 2 L/min   Intake/Output Summary (Last 24 hours) at 10/26/2021 0841 Last data filed at 10/25/2021 2000 Gross per 24 hour  Intake 120 ml  Output --  Net 120 ml   Filed Weights   10/22/21 0619  Weight: 79.2 kg    Exam: General exam: In no acute distress. Respiratory system: Good air movement and clear to auscultation. Cardiovascular system: S1 & S2 heard, RRR. No JVD. Gastrointestinal system: Abdomen is nondistended, soft and nontender.  Extremities: No pedal edema. Skin: No rashes, lesions or ulcers Psychiatry: Judgement and insight appear normal. Mood & affect appropriate.    Data Reviewed:    Labs: Basic Metabolic Panel: Recent Labs  Lab 10/21/21 0800 10/22/21 0544 10/23/21 0716 10/24/21 1243 10/24/21 2108 10/25/21 0331 10/26/21 0300  NA 140   < > 138 143 138 141 141  K 3.7   < > 3.6 3.4* 4.2 3.6 3.5  CL 106   < > 110 112* 114* 118* 116*  CO2 26   < > 23 21* 21* 19* 20*  GLUCOSE 86   < > 104* 139* 126* 125* 111*  BUN 10   < > '8 6 8 10 13  '$ CREATININE 0.56   < > 0.53 0.58 0.60 0.50 0.60  CALCIUM 8.2*   < > 7.9* 8.2* 8.4* 8.2* 8.4*  MG 1.7  --   --   --   --   --   --   PHOS  --   --   --   --   --   --  2.8   < > = values in this interval not displayed.   GFR Estimated Creatinine Clearance: 93.4 mL/min (by C-G formula based on SCr of 0.6 mg/dL). Liver Function Tests: Recent Labs  Lab 10/20/21 1748 10/21/21 0800 10/23/21 0716 10/25/21 0331 10/26/21 0300  AST 11* 14* 12* 8*  --   ALT '8 12 12 7  '$ --   ALKPHOS 65 53 52 21*  --   BILITOT 0.3 0.6 0.5 0.4  --   PROT 6.9 5.8* 5.3* 4.7*  --   ALBUMIN 3.8 2.8* 2.3* 3.4* 3.2*   Recent Labs  Lab 10/20/21 1748  LIPASE 39   Recent Labs  Lab 10/23/21 0716  AMMONIA 29   Coagulation profile No results for input(s):  "INR", "PROTIME" in the last 168 hours. COVID-19 Labs  No results for input(s): "DDIMER", "FERRITIN", "LDH", "CRP" in the last 72 hours.  Lab Results  Component Value Date   SARSCOV2NAA NEGATIVE 11/15/2020   [place] NEGATIVE 08/14/2020   [place] Not Detected 06/06/2018    CBC: Recent Labs  Lab 10/20/21 1748 10/21/21 0510 10/22/21 0544 10/23/21 0716 10/24/21 1243 10/25/21 0331 10/26/21 0300  WBC 7.2   < > 6.2 6.4 6.3 6.6 7.6  NEUTROABS 4.5  --   --   --   --   --   --  HGB 11.4*   < > 10.5* 9.2* 9.5* 9.0* 9.1*  HCT 36.8   < > 34.7* 30.7* 32.6* 29.9* 29.8*  MCV 84.6   < > 87.2 86.2 89.3 87.2 86.9  PLT 251   < > 211 194 208 177 185   < > = values in this interval not displayed.   Cardiac Enzymes: No results for input(s): "CKTOTAL", "CKMB", "CKMBINDEX", "TROPONINI" in the last 168 hours. BNP (last 3 results) No results for input(s): "PROBNP" in the last 8760 hours. CBG: Recent Labs  Lab 10/25/21 0638 10/25/21 1128 10/25/21 1632 10/25/21 2106 10/26/21 0634  GLUCAP 98 122* 141* 137* 105*   D-Dimer: No results for input(s): "DDIMER" in the last 72 hours. Hgb A1c: No results for input(s): "HGBA1C" in the last 72 hours. Lipid Profile: No results for input(s): "CHOL", "HDL", "LDLCALC", "TRIG", "CHOLHDL", "LDLDIRECT" in the last 72 hours. Thyroid function studies: No results for input(s): "TSH", "T4TOTAL", "T3FREE", "THYROIDAB" in the last 72 hours.  Invalid input(s): "FREET3" Anemia work up: No results for input(s): "VITAMINB12", "FOLATE", "FERRITIN", "TIBC", "IRON", "RETICCTPCT" in the last 72 hours. Sepsis Labs: Recent Labs  Lab 10/21/21 0549 10/21/21 0800 10/23/21 0716 10/24/21 1243 10/25/21 0331 10/26/21 0300  PROCALCITON <0.10  --   --   --   --   --   WBC  --    < > 6.4 6.3 6.6 7.6   < > = values in this interval not displayed.   Microbiology No results found for this or any previous visit (from the past 240 hour(s)).   Medications:     azaTHIOprine  150 mg Oral q AM   Chlorhexidine Gluconate Cloth  6 each Topical Daily   DULoxetine  60 mg Oral Daily   heparin  5,000 Units Subcutaneous Q8H   heparin sodium (porcine)  1,000 Units Intracatheter Once   insulin aspart  0-9 Units Subcutaneous TID WC   levothyroxine  175 mcg Oral Daily   mometasone-formoterol  2 puff Inhalation BID   pantoprazole  40 mg Oral Daily   predniSONE  10 mg Oral Q breakfast   pyridostigmine  60 mg Oral QID   topiramate  200 mg Oral Q1500   traZODone  50 mg Oral QHS   Continuous Infusions:  albumin human 25 % 50 g in sodium chloride 0.9 %     calcium gluconate     citrate dextrose        LOS: 5 days   Charlynne Cousins  [facility]  10/26/2021, 8:41 AM

## 2021-10-26 NOTE — Care Management Important Message (Signed)
Important Message  Patient Details  Name: Jade Mathis MRN: [id number] Date of Birth: September 04, 1975   Medicare Important Message Given:  Yes     [place], [place] 10/26/2021, 10:31 AM

## 2021-10-26 NOTE — Plan of Care (Signed)

## 2021-10-26 NOTE — Progress Notes (Signed)
Pt off floor for treatment

## 2021-10-27 ENCOUNTER — Ambulatory Visit: Payer: Medicare Other | Admitting: Physical Therapy

## 2021-10-27 DIAGNOSIS — E1169 Type 2 diabetes mellitus with other specified complication: Secondary | ICD-10-CM | POA: Diagnosis not present

## 2021-10-27 DIAGNOSIS — E669 Obesity, unspecified: Secondary | ICD-10-CM | POA: Diagnosis not present

## 2021-10-27 DIAGNOSIS — G7001 Myasthenia gravis with (acute) exacerbation: Secondary | ICD-10-CM | POA: Diagnosis not present

## 2021-10-27 LAB — GLUCOSE, CAPILLARY
Glucose-Capillary: 85 mg/dL (ref 70–99)
Glucose-Capillary: 131 mg/dL — ABNORMAL HIGH (ref 70–99)
Glucose-Capillary: 172 mg/dL — ABNORMAL HIGH (ref 70–99)
Glucose-Capillary: 111 mg/dL — ABNORMAL HIGH (ref 70–99)

## 2021-10-27 NOTE — Progress Notes (Signed)
Subjective: Patient states that she feels stronger after initial PLEX treatments.   Objective: Current vital signs: BP 100/68 (BP Location: Left Arm)   Pulse 66   Temp 97.9 F (36.6 C) (Oral)   Resp 18   Ht '5\' 6"'$  (1.676 m)   Wt 82.3 kg   LMP 10/13/2021   SpO2 97%   BMI 29.28 kg/m  Vital signs in last 24 hours: Temp:  [97.5 F (36.4 C)-98.4 F (36.9 C)] 97.9 F (36.6 C) (08/10 0823) Pulse Rate:  [66-94] 66 (08/10 0823) Resp:  [14-23] 18 (08/10 0823) BP: (95-103)/(55-69) 100/68 (08/10 0823) SpO2:  [97 %-100 %] 97 % (08/10 0823) Weight:  [82.3 kg] 82.3 kg (08/10 0546)  Intake/Output from previous day: 08/09 0701 - 08/10 0700 In: 1420 [P.O.:420; IV Piggyback:1000] Out: 6 [Urine:6] Intake/Output this shift: No intake/output data recorded. Nutritional status:  Diet Order             Diet Carb Modified Fluid consistency: Thin; Room service appropriate? Yes  Diet effective now                  HEENT: [place]/AT Lungs: Respirations unlabored Ext: No edema  Neurologic Exam: Ment: Awake and alert. Speech fluent with intact comprehension. Pleasant and cooperative.  CN: Fixates and tracks normally. EOMI. Transverse smile (aka "myasthenic snarl"). Phonation intact.  Motor: 4/5 BUE and BLE. No spasticity noted.  Sensory: Intact to touch x 4 Reflexes: Normoactive x 4 Cerebellar: No ataxia noted Gait: Deferred  Lab Results: Results for orders placed or performed during the hospital encounter of 10/20/21 (from the past 48 hour(s))  Glucose, capillary     Status: Abnormal   Collection Time: 10/25/21 11:28 AM  Result Value Ref Range   Glucose-Capillary 122 (H) 70 - 99 mg/dL    Comment: Glucose reference range applies only to samples taken after fasting for at least 8 hours.  Glucose, capillary     Status: Abnormal   Collection Time: 10/25/21  4:32 PM  Result Value Ref Range   Glucose-Capillary 141 (H) 70 - 99 mg/dL    Comment: Glucose reference range applies only to  samples taken after fasting for at least 8 hours.  Glucose, capillary     Status: Abnormal   Collection Time: 10/25/21  9:06 PM  Result Value Ref Range   Glucose-Capillary 137 (H) 70 - 99 mg/dL    Comment: Glucose reference range applies only to samples taken after fasting for at least 8 hours.   Comment 1 Notify RN    Comment 2 Document in Chart   Renal function panel     Status: Abnormal   Collection Time: 10/26/21  3:00 AM  Result Value Ref Range   Sodium 141 135 - 145 mmol/L   Potassium 3.5 3.5 - 5.1 mmol/L   Chloride 116 (H) 98 - 111 mmol/L   CO2 20 (L) 22 - 32 mmol/L   Glucose, Bld 111 (H) 70 - 99 mg/dL    Comment: Glucose reference range applies only to samples taken after fasting for at least 8 hours.   BUN 13 6 - 20 mg/dL   Creatinine, Ser 0.60 0.44 - 1.00 mg/dL   Calcium 8.4 (L) 8.9 - 10.3 mg/dL   Phosphorus 2.8 2.5 - 4.6 mg/dL   Albumin 3.2 (L) 3.5 - 5.0 g/dL   GFR, Estimated >60 >60 mL/min    Comment: (NOTE) Calculated using the CKD-EPI Creatinine Equation (2021)    Anion gap 5 5 - 15  Comment: Performed at [facility], [place] [address] [place], [place] [address]  CBC     Status: Abnormal   Collection Time: 10/26/21  3:00 AM  Result Value Ref Range   WBC 7.6 4.0 - 10.5 K/uL   RBC 3.43 (L) 3.87 - 5.11 MIL/uL   Hemoglobin 9.1 (L) 12.0 - 15.0 g/dL   HCT 29.8 (L) 36.0 - 46.0 %   MCV 86.9 80.0 - 100.0 fL   MCH 26.5 26.0 - 34.0 pg   MCHC 30.5 30.0 - 36.0 g/dL   RDW 13.8 11.5 - 15.5 %   Platelets 185 150 - 400 K/uL   nRBC 0.3 (H) 0.0 - 0.2 %    Comment: Performed at [facility] [address], [place], [place] [address]  Glucose, capillary     Status: Abnormal   Collection Time: 10/26/21  6:34 AM  Result Value Ref Range   Glucose-Capillary 105 (H) 70 - 99 mg/dL    Comment: Glucose reference range applies only to samples taken after fasting for at least 8 hours.  Glucose, capillary     Status: Abnormal   Collection Time: 10/26/21 12:06 PM  Result  Value Ref Range   Glucose-Capillary 154 (H) 70 - 99 mg/dL    Comment: Glucose reference range applies only to samples taken after fasting for at least 8 hours.  Glucose, capillary     Status: Abnormal   Collection Time: 10/26/21  4:37 PM  Result Value Ref Range   Glucose-Capillary 177 (H) 70 - 99 mg/dL    Comment: Glucose reference range applies only to samples taken after fasting for at least 8 hours.  Glucose, capillary     Status: Abnormal   Collection Time: 10/26/21 10:09 PM  Result Value Ref Range   Glucose-Capillary 162 (H) 70 - 99 mg/dL    Comment: Glucose reference range applies only to samples taken after fasting for at least 8 hours.   Comment 1 Notify RN    Comment 2 Document in Chart   Glucose, capillary     Status: None   Collection Time: 10/27/21  6:09 AM  Result Value Ref Range   Glucose-Capillary 85 70 - 99 mg/dL    Comment: Glucose reference range applies only to samples taken after fasting for at least 8 hours.   Comment 1 Notify RN    Comment 2 Document in Chart    *Note: Due to a large number of results and/or encounters for the requested time period, some results have not been displayed. A complete set of results can be found in Results Review.    No results found for this or any previous visit (from the past 240 hour(s)).  Lipid Panel No results for input(s): "CHOL", "TRIG", "HDL", "CHOLHDL", "VLDL", "LDLCALC" in the last 72 hours.  Studies/Results: No results found.  Medications: Scheduled:  azaTHIOprine  150 mg Oral q AM   Chlorhexidine Gluconate Cloth  6 each Topical Daily   DULoxetine  60 mg Oral Daily   heparin  5,000 Units Subcutaneous Q8H   insulin aspart  0-9 Units Subcutaneous TID WC   levothyroxine  175 mcg Oral Daily   mometasone-formoterol  2 puff Inhalation BID   pantoprazole  40 mg Oral Daily   polyethylene glycol  17 g Oral BID   predniSONE  10 mg Oral Q breakfast   pyridostigmine  60 mg Oral QID   topiramate  200 mg Oral Q1500    traZODone  50 mg  Oral QHS   Continuous:  citrate dextrose     Assessment: 46 y.o. female with PMHs of MG (diagnosed in 1997 on RNS and serology and s/p thymectomy) on prednisone '10mg'$ , azathrioprine '150mg'$ /d, and mestinon '60mg'$  QID who presented with worsening weakness over the last [redacted] weeks along with shortness of breath with exertion and some tightness in her chest.  She reports has been having trouble chewing with swallowing. She reports IVIG has worked well for her in the past. She has tried PLEX in the past when she was diagnosed about 30 years ago. We discussed potential risk of worsening of ACS with IVIG, especially since she was having significant chest tightness. She opted to go with PLEX. We also spoke with her and discussed that her Lisinopril needs to be held for PLEX. Also discused details along with risks and benefits of PLEX. Tunneled catheter placed 8/4. PLEX started on 8/7  - Symptomatically improved after 2 sessions of PLEX - Exam today: Mild weakness x 4. Myasthenic snarl noted.    Recommendations: - Has had 2 PLEX sessions. Will continue every other day x 5 sessions total. - Continue holding Lisinopril. No ACE inhibitors or ARBs while on PLEX. - Every 6 hours NIFs and VC - Consider elective intubation for respiratory compromise if VC falls below 15 to 20 mL/kg and or NIFs falls below -20cm/H2O. If poor effort and not sure, can always get ABG to assess for CO2 retention. Also consider elective intubation for airway protection if patient develops difficulty managing secretions - Continue home Mestinon PO '60mg'$  QID. If unable to swallow, can switch from PO to IV.('30mg'$  PO is equivalent to '1mg'$  IV). Continue Prednisone '10mg'$  daily and Azathioprine '150mg'$  daily. - Medications that may worsen or trigger MG exacerbation: Class IA antiarrhythmics, magnesium, flouroquinolones, macrolides, aminoglycosides, penicillamine, curare, interferon alpha, botox, quinine. Use with caution: calcium channel  blocker, beta blockers and statins.   LOS: 6 days   '@Electronically'$  signed: Dr. Kerney Elbe 10/27/2021  10:47 AM

## 2021-10-27 NOTE — Progress Notes (Signed)
Patient refused NIF/VC at this time

## 2021-10-27 NOTE — Progress Notes (Signed)
Pt refused NIF & VC at this time.  

## 2021-10-27 NOTE — Progress Notes (Signed)
Great Effort with the NIF. Average of three attempts= - 30

## 2021-10-27 NOTE — Progress Notes (Signed)
[facility] PROGRESS NOTE    Progress Note  Jade Mathis  EXH:[id number] DOB: [birth year] DOA: 10/20/2021 PCP: Hoyt Koch, MD     Brief Narrative:   Jade Mathis is an 46 y.o. 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 on chronic steroids, Azathioprine and Mestinon presents to [place] with history of 3 weeks of gradual onset progressive weakness shortness of breath, double vision, difficulty masticating and chest pressure continues were consistent with myasthenia gravis flare, in the ER she was evaluated by neuro who recommended plasma exchange and admission  Consultant: Neurology Assessment/Plan:   Acute Myasthenia exacerbation [facility]): Undergoing plasmapheresis by neurology initiated on 10/24/2021 every other day for 5 sessions Avoid medications that can trigger myasthenia, quinolones and macrolides quinine and cautions with calcium channel blockers and beta-blockers and statins. Continue NIF and vital capacity every 6 hours.  She refused her [place] vital capacity this morning. Continue Mestinon, prednisone and azathioprine. Neurology on board and awaiting further recommendations.  Narcotic/multidrug induced obtundation on 07/22/2021: Patient has insisted she takes Oxy 15 mg 4 times a day which she cannot tolerate due to depression of her respiratory status and hypotension.  Therefore oxycodone was stopped. Review of her records has shown she uses chronic benzos, due to her episode of hypotension these were held. Thus far she has exhibited no withdrawal of narcotics or benzos.  Persistent hypotension: Avoid hypertensive medications or medications that can drop her blood pressure. She is currently asymptomatic. ACE inhibitor has been discontinued. Hypotension has resolved her blood  pressure is improving.  She will probably need to go home off antihypertensive medication.  Anxiety disorder/bipolar disorder: Narcotics and benzo were held due to intermittent episodes of somnolence and hypotension she has experienced no withdrawal. She has been off her medications since the fifth of this month.  Right upper extremity edema: Upper extremity Doppler was negative for DVT appears to be due to saline infiltration improved with elevation.  Diabetes mellitus type 2: Long-acting insulin plus sliding scale, blood glucose under excellent control.  Normocytic anemia: Due to anemia of chronic disease follow-up with PCP as an outpatient.  Asthma: Noted.  Hypothyroidism: Continue Synthroid.   DVT prophylaxis: lovenox Family Communication:none Status is: Inpatient Remains inpatient appropriate because: Acute myasthenia gravis exacerbation.    Code Status:     Code Status Orders  (From admission, onward)           Start     Ordered   10/21/21 0720  Full code  Continuous        10/21/21 0719           Code Status History     Date Active Date Inactive Code Status Order ID Comments User Context   08/26/2021 1729 08/30/2021 2107 Full Code [id number]  Nita Sells, MD ED   08/14/2020 0730 08/16/2020 2321 Full Code [id number]  Norval Morton, MD ED   09/23/2016 1507 09/25/2016 1829 Full Code [id number]  Waldemar Dickens, MD Inpatient   09/22/2016 1017 09/23/2016 1507 Full Code [id number]  Shirley, [place], DO Inpatient   06/13/2016 2254 06/14/2016 2054 Full Code [id number]  Sherlyn Hay, DO Inpatient   11/04/2015 0029 11/04/2015 1232 Full Code [id number]  Tresea Mall, CNM Inpatient   09/10/2014 0011 09/13/2014 1352 Full Code [id number]  Toy Baker, MD Inpatient   10/11/2012 2032 10/12/2012 0321 Full  Code [id number]  Montine Circle, PA-C ED         IV Access:   Peripheral IV   Procedures and diagnostic studies:   No results  found.   Medical Consultants:   None.   Subjective:    Jade Mathis more awake alert in a good mood this morning.  Objective:    Vitals:   10/26/21 2207 10/27/21 0002 10/27/21 0546 10/27/21 0823  BP: (!) 95/57 103/69 (!) 97/58 100/68  Pulse: 73 70 75 66  Resp: '20 17 20 18  '$ Temp: 98.3 F (36.8 C) 98.2 F (36.8 C) 98.2 F (36.8 C) 97.9 F (36.6 C)  TempSrc: Oral Oral Oral Oral  SpO2: 100% 100% 97% 97%  Weight:   82.3 kg   Height:       SpO2: 97 % O2 Flow Rate (L/min): 2 L/min   Intake/Output Summary (Last 24 hours) at 10/27/2021 0942 Last data filed at 10/27/2021 0400 Gross per 24 hour  Intake 1240 ml  Output 4 ml  Net 1236 ml    Filed Weights   10/22/21 0619 10/27/21 0546  Weight: 79.2 kg 82.3 kg    Exam: General exam: In no acute distress. Respiratory system: Good air movement and clear to auscultation. Cardiovascular system: S1 & S2 heard, RRR. No JVD. Gastrointestinal system: Abdomen is nondistended, soft and nontender.  Extremities: No pedal edema. Skin: No rashes, lesions or ulcers Psychiatry: Judgement and insight appear normal.  More extroverted and outgoing this morning. Data Reviewed:    Labs: Basic Metabolic Panel: Recent Labs  Lab 10/21/21 0800 10/22/21 0544 10/23/21 0716 10/24/21 1243 10/24/21 2108 10/25/21 0331 10/26/21 0300  NA 140   < > 138 143 138 141 141  K 3.7   < > 3.6 3.4* 4.2 3.6 3.5  CL 106   < > 110 112* 114* 118* 116*  CO2 26   < > 23 21* 21* 19* 20*  GLUCOSE 86   < > 104* 139* 126* 125* 111*  BUN 10   < > '8 6 8 10 13  '$ CREATININE 0.56   < > 0.53 0.58 0.60 0.50 0.60  CALCIUM 8.2*   < > 7.9* 8.2* 8.4* 8.2* 8.4*  MG 1.7  --   --   --   --   --   --   PHOS  --   --   --   --   --   --  2.8   < > = values in this interval not displayed.    GFR Estimated Creatinine Clearance: 95 mL/min (by C-G formula based on SCr of 0.6 mg/dL). Liver Function Tests: Recent Labs  Lab 10/20/21 1748 10/21/21 0800  10/23/21 0716 10/25/21 0331 10/26/21 0300  AST 11* 14* 12* 8*  --   ALT '8 12 12 7  '$ --   ALKPHOS 65 53 52 21*  --   BILITOT 0.3 0.6 0.5 0.4  --   PROT 6.9 5.8* 5.3* 4.7*  --   ALBUMIN 3.8 2.8* 2.3* 3.4* 3.2*    Recent Labs  Lab 10/20/21 1748  LIPASE 39    Recent Labs  Lab 10/23/21 0716  AMMONIA 29    Coagulation profile No results for input(s): "INR", "PROTIME" in the last 168 hours. COVID-19 Labs  No results for input(s): "DDIMER", "FERRITIN", "LDH", "CRP" in the last 72 hours.  Lab Results  Component Value Date   SARSCOV2NAA NEGATIVE 11/15/2020   [place] NEGATIVE 08/14/2020   [place] Not Detected 06/06/2018  CBC: Recent Labs  Lab 10/20/21 1748 10/21/21 0510 10/22/21 0544 10/23/21 0716 10/24/21 1243 10/25/21 0331 10/26/21 0300  WBC 7.2   < > 6.2 6.4 6.3 6.6 7.6  NEUTROABS 4.5  --   --   --   --   --   --   HGB 11.4*   < > 10.5* 9.2* 9.5* 9.0* 9.1*  HCT 36.8   < > 34.7* 30.7* 32.6* 29.9* 29.8*  MCV 84.6   < > 87.2 86.2 89.3 87.2 86.9  PLT 251   < > 211 194 208 177 185   < > = values in this interval not displayed.    Cardiac Enzymes: No results for input(s): "CKTOTAL", "CKMB", "CKMBINDEX", "TROPONINI" in the last 168 hours. BNP (last 3 results) No results for input(s): "PROBNP" in the last 8760 hours. CBG: Recent Labs  Lab 10/26/21 0634 10/26/21 1206 10/26/21 1637 10/26/21 2209 10/27/21 0609  GLUCAP 105* 154* 177* 162* 85    D-Dimer: No results for input(s): "DDIMER" in the last 72 hours. Hgb A1c: No results for input(s): "HGBA1C" in the last 72 hours. Lipid Profile: No results for input(s): "CHOL", "HDL", "LDLCALC", "TRIG", "CHOLHDL", "LDLDIRECT" in the last 72 hours. Thyroid function studies: No results for input(s): "TSH", "T4TOTAL", "T3FREE", "THYROIDAB" in the last 72 hours.  Invalid input(s): "FREET3" Anemia work up: No results for input(s): "VITAMINB12", "FOLATE", "FERRITIN", "TIBC", "IRON", "RETICCTPCT" in the last 72  hours. Sepsis Labs: Recent Labs  Lab 10/21/21 0549 10/21/21 0800 10/23/21 0716 10/24/21 1243 10/25/21 0331 10/26/21 0300  PROCALCITON <0.10  --   --   --   --   --   WBC  --    < > 6.4 6.3 6.6 7.6   < > = values in this interval not displayed.    Microbiology No results found for this or any previous visit (from the past 240 hour(s)).   Medications:    azaTHIOprine  150 mg Oral q AM   Chlorhexidine Gluconate Cloth  6 each Topical Daily   DULoxetine  60 mg Oral Daily   heparin  5,000 Units Subcutaneous Q8H   insulin aspart  0-9 Units Subcutaneous TID WC   levothyroxine  175 mcg Oral Daily   mometasone-formoterol  2 puff Inhalation BID   pantoprazole  40 mg Oral Daily   polyethylene glycol  17 g Oral BID   predniSONE  10 mg Oral Q breakfast   pyridostigmine  60 mg Oral QID   topiramate  200 mg Oral Q1500   traZODone  50 mg Oral QHS   Continuous Infusions:  citrate dextrose        LOS: 6 days   Charlynne Cousins  [facility]  10/27/2021, 9:42 AM
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
## 2021-10-27 NOTE — Plan of Care (Signed)

## 2021-10-28 DIAGNOSIS — G7001 Myasthenia gravis with (acute) exacerbation: Secondary | ICD-10-CM | POA: Diagnosis not present

## 2021-10-28 LAB — BASIC METABOLIC PANEL
Anion gap: 5 (ref 5–15)
BUN: 15 mg/dL (ref 6–20)
CO2: 25 mmol/L (ref 22–32)
Calcium: 8.8 mg/dL — ABNORMAL LOW (ref 8.9–10.3)
Chloride: 111 mmol/L (ref 98–111)
Creatinine, Ser: 0.68 mg/dL (ref 0.44–1.00)
GFR, Estimated: >60 mL/min (ref >60)
Glucose, Bld: 167 mg/dL — ABNORMAL HIGH (ref 70–99)
Potassium: 3.8 mmol/L (ref 3.5–5.1)
Sodium: 141 mmol/L (ref 135–145)

## 2021-10-28 LAB — GLUCOSE, CAPILLARY
Glucose-Capillary: 100 mg/dL — ABNORMAL HIGH (ref 70–99)
Glucose-Capillary: 175 mg/dL — ABNORMAL HIGH (ref 70–99)
Glucose-Capillary: 226 mg/dL — ABNORMAL HIGH (ref 70–99)
Glucose-Capillary: 189 mg/dL — ABNORMAL HIGH (ref 70–99)

## 2021-10-28 LAB — MAGNESIUM: Magnesium: 2 mg/dL (ref 1.7–2.4)

## 2021-10-28 MED ORDER — HEPARIN SODIUM (PORCINE) 1000 UNIT/ML IJ SOLN
INTRAMUSCULAR | Status: AC
  Filled 2021-10-28: qty 4

## 2021-10-28 MED ORDER — HEPARIN SODIUM (PORCINE) 1000 UNIT/ML IJ SOLN
1000.0000 [IU] | Freq: Once | INTRAMUSCULAR | Status: AC
  Administered 2021-10-28: 1000 [IU]
  Filled 2021-10-28: qty 1

## 2021-10-28 MED ORDER — DIPHENHYDRAMINE HCL 25 MG PO CAPS
ORAL_CAPSULE | ORAL | Status: AC
  Filled 2021-10-28: qty 1

## 2021-10-28 MED ORDER — DIPHENHYDRAMINE HCL 25 MG PO CAPS: 25.0000 mg | ORAL_CAPSULE | Freq: Four times a day (QID) | ORAL | Status: DC | PRN

## 2021-10-28 MED ORDER — ACETAMINOPHEN 325 MG PO TABS
ORAL_TABLET | ORAL | Status: AC
  Filled 2021-10-28: qty 2

## 2021-10-28 MED ORDER — ACETAMINOPHEN 325 MG PO TABS: 650.0000 mg | ORAL_TABLET | ORAL | Status: DC | PRN

## 2021-10-28 MED ORDER — ACD FORMULA A 0.73-2.45-2.2 GM/100ML VI SOLN
1000.0000 mL | Status: DC
  Administered 2021-10-28: 1000 mL

## 2021-10-28 MED ORDER — CALCIUM GLUCONATE-NACL 2-0.675 GM/100ML-% IV SOLN
INTRAVENOUS | Status: AC
  Filled 2021-10-28: qty 100

## 2021-10-28 MED ORDER — CALCIUM CARBONATE ANTACID 500 MG PO CHEW
CHEWABLE_TABLET | ORAL | Status: AC
  Filled 2021-10-28: qty 2

## 2021-10-28 MED ORDER — SODIUM CHLORIDE 0.9 % IV SOLN
INTRAVENOUS | Status: AC
  Filled 2021-10-28 (×3): qty 200

## 2021-10-28 MED ORDER — CALCIUM GLUCONATE-NACL 2-0.675 GM/100ML-% IV SOLN
2.0000 g | Freq: Once | INTRAVENOUS | Status: AC
  Administered 2021-10-28: 2000 mg via INTRAVENOUS
  Filled 2021-10-28 (×2): qty 100

## 2021-10-28 NOTE — Consult Note (Signed)
   [facility] Inpatient Consult   10/28/2021  Jade Mathis 12-30-1975 [id number]  [place] [facility] [ACO] Patient: Jade Mathis  Primary Care Provider:  Hoyt Koch, MD, [place] at [facility], is an embedded provider with a Chronic Care Management team and program, and is listed for the transition of care follow up and appointments.  Patient was screened for length of stay and extreme nigh risk score for unplanned readmission risk Embedded practice service needs for care management needs. 3:12 pm Spoke with inpatient [facility] RNCM that patient is receiving plasma exchanges every other day for ongoing treatment for her MG. Lives with family, no SDOH needs noted. Plan: Continue to follow for [facility] needs for post hospital care. Patient had OP Rehab noted prior to admission.  Please contact for further questions,  Natividad Brood, RN BSN [facility] Liaison  [phone] business mobile phone Toll free office [phone]  Fax number: [phone] [email or web address]

## 2021-10-28 NOTE — Progress Notes (Signed)
[facility] PROGRESS NOTE    Progress Note  Jade Mathis  WGY:[id number] DOB: 01/16/1976 DOA: 10/20/2021 PCP: Hoyt Koch, MD     Brief Narrative:   Jade Mathis is an 46 y.o. 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 on chronic steroids, Azathioprine and Mestinon presents to [place] with history of 3 weeks of gradual onset progressive weakness shortness of breath, double vision, difficulty masticating and chest pressure continues were consistent with myasthenia gravis flare, in the ER she was evaluated by neuro who recommended plasma exchange and admission  Consultant: Neurology Assessment/Plan:   Acute Myasthenia exacerbation [facility]): Undergoing plasmapheresis by neurology initiated on 10/24/2021 every other day for 5 sessions Avoid medications that can trigger myasthenia, quinolones and macrolides quinine and cautions with calcium channel blockers and beta-blockers and statins. Continue NIF and vital capacity every 6 hours. Received 2 Plex treatment. Neurology on board and awaiting further recommendations.  Narcotic/multidrug induced obtundation on 07/22/2021: Patient has insisted she takes Oxy 15 mg 4 times a day which she cannot tolerate due to depression of her respiratory status and hypotension.  Therefore oxycodone was stopped. Review of her records has shown she uses chronic benzos, due to her episode of hypotension these were held. Thus far she has exhibited no withdrawal of narcotics or benzos.  Persistent hypotension: Avoid hypertensive medications or medications that can drop her blood pressure. She is currently asymptomatic. ACE inhibitor has been discontinued. Blood pressure continues to be borderline low she will have to be discharged off her medications.  Anxiety  disorder/bipolar disorder: Narcotics and benzo were held due to intermittent episodes of somnolence and hypotension she has experienced no withdrawal. She has been off her medications since the fifth of this month.  Right upper extremity edema: Upper extremity Doppler was negative for DVT appears to be due to saline infiltration improved with elevation.  Diabetes mellitus type 2: Long-acting insulin plus sliding scale, blood glucose under excellent control.  Normocytic anemia: Due to anemia of chronic disease follow-up with PCP as an outpatient.  Asthma: Noted.  Hypothyroidism: Continue Synthroid.   DVT prophylaxis: lovenox Family Communication:none Status is: Inpatient Remains inpatient appropriate because: Acute myasthenia gravis exacerbation.    Code Status:     Code Status Orders  (From admission, onward)           Start     Ordered   10/21/21 0720  Full code  Continuous        10/21/21 0719           Code Status History     Date Active Date Inactive Code Status Order ID Comments User Context   08/26/2021 1729 08/30/2021 2107 Full Code [id number]  Nita Sells, MD ED   08/14/2020 0730 08/16/2020 2321 Full Code [id number]  Norval Morton, MD ED   09/23/2016 1507 09/25/2016 1829 Full Code [id number]  Waldemar Dickens, MD Inpatient   09/22/2016 1017 09/23/2016 1507 Full Code [id number]  [place], [place], [place] Inpatient   06/13/2016 2254 06/14/2016 2054 Full Code [id number]  Sherlyn Hay, DO Inpatient   11/04/2015 0029 11/04/2015 1232 Full Code [id number]  Tresea Mall, CNM Inpatient   09/10/2014 0011 09/13/2014 1352 Full Code [id number]  Toy Baker, MD Inpatient   10/11/2012 2032 10/12/2012 0321 Full Code [id number]  Delaine Lame ED  IV Access:   Peripheral IV   Procedures and diagnostic studies:   No results found.   Medical Consultants:   None.   Subjective:    Jade Mathis feel stronger after  treatment.  Objective:    Vitals:   10/28/21 0500 10/28/21 0827 10/28/21 0909 10/28/21 1109  BP:   (!) 92/58 (!) 81/51  Pulse:   88 76  Resp:   16 18  Temp:   97.6 F (36.4 C) (!) 97.5 F (36.4 C)  TempSrc:   Oral Oral  SpO2:  96% 99%   Weight: 78.7 kg     Height:       SpO2: 99 % O2 Flow Rate (L/min): 2 L/min   Intake/Output Summary (Last 24 hours) at 10/28/2021 1131 Last data filed at 10/28/2021 0900 Gross per 24 hour  Intake 120 ml  Output --  Net 120 ml    Filed Weights   10/22/21 0619 10/27/21 0546 10/28/21 0500  Weight: 79.2 kg 82.3 kg 78.7 kg    Exam: General exam: In no acute distress. Respiratory system: Good air movement and clear to auscultation. Cardiovascular system: S1 & S2 heard, RRR. No JVD. Gastrointestinal system: Abdomen is nondistended, soft and nontender.  Extremities: No pedal edema. Skin: No rashes, lesions or ulcers Psychiatry: Judgement and insight appear normal. Mood & affect appropriate. Data Reviewed:    Labs: Basic Metabolic Panel: Recent Labs  Lab 10/23/21 0716 10/24/21 1243 10/24/21 2108 10/25/21 0331 10/26/21 0300  NA 138 143 138 141 141  K 3.6 3.4* 4.2 3.6 3.5  CL 110 112* 114* 118* 116*  CO2 23 21* 21* 19* 20*  GLUCOSE 104* 139* 126* 125* 111*  BUN '8 6 8 10 13  '$ CREATININE 0.53 0.58 0.60 0.50 0.60  CALCIUM 7.9* 8.2* 8.4* 8.2* 8.4*  PHOS  --   --   --   --  2.8    GFR Estimated Creatinine Clearance: 93.1 mL/min (by C-G formula based on SCr of 0.6 mg/dL). Liver Function Tests: Recent Labs  Lab 10/23/21 0716 10/25/21 0331 10/26/21 0300  AST 12* 8*  --   ALT 12 7  --   ALKPHOS 52 21*  --   BILITOT 0.5 0.4  --   PROT 5.3* 4.7*  --   ALBUMIN 2.3* 3.4* 3.2*    No results for input(s): "LIPASE", "AMYLASE" in the last 168 hours.  Recent Labs  Lab 10/23/21 0716  AMMONIA 29    Coagulation profile No results for input(s): "INR", "PROTIME" in the last 168 hours. COVID-19 Labs  No results for input(s):  "DDIMER", "FERRITIN", "LDH", "CRP" in the last 72 hours.  Lab Results  Component Value Date   SARSCOV2NAA NEGATIVE 11/15/2020   [place] NEGATIVE 08/14/2020   [place] Not Detected 06/06/2018    CBC: Recent Labs  Lab 10/22/21 0544 10/23/21 0716 10/24/21 1243 10/25/21 0331 10/26/21 0300  WBC 6.2 6.4 6.3 6.6 7.6  HGB 10.5* 9.2* 9.5* 9.0* 9.1*  HCT 34.7* 30.7* 32.6* 29.9* 29.8*  MCV 87.2 86.2 89.3 87.2 86.9  PLT 211 194 208 177 185    Cardiac Enzymes: No results for input(s): "CKTOTAL", "CKMB", "CKMBINDEX", "TROPONINI" in the last 168 hours. BNP (last 3 results) No results for input(s): "PROBNP" in the last 8760 hours. CBG: Recent Labs  Lab 10/27/21 1122 10/27/21 1557 10/27/21 2131 10/28/21 0633 10/28/21 1114  GLUCAP 131* 172* 111* 100* 175*    D-Dimer: No results for input(s): "DDIMER" in the last 72 hours. Hgb A1c:  No results for input(s): "HGBA1C" in the last 72 hours. Lipid Profile: No results for input(s): "CHOL", "HDL", "LDLCALC", "TRIG", "CHOLHDL", "LDLDIRECT" in the last 72 hours. Thyroid function studies: No results for input(s): "TSH", "T4TOTAL", "T3FREE", "THYROIDAB" in the last 72 hours.  Invalid input(s): "FREET3" Anemia work up: No results for input(s): "VITAMINB12", "FOLATE", "FERRITIN", "TIBC", "IRON", "RETICCTPCT" in the last 72 hours. Sepsis Labs: Recent Labs  Lab 10/23/21 0716 10/24/21 1243 10/25/21 0331 10/26/21 0300  WBC 6.4 6.3 6.6 7.6    Microbiology No results found for this or any previous visit (from the past 240 hour(s)).   Medications:    azaTHIOprine  150 mg Oral q AM   Chlorhexidine Gluconate Cloth  6 each Topical Daily   DULoxetine  60 mg Oral Daily   heparin  5,000 Units Subcutaneous Q8H   insulin aspart  0-9 Units Subcutaneous TID WC   levothyroxine  175 mcg Oral Daily   mometasone-formoterol  2 puff Inhalation BID   pantoprazole  40 mg Oral Daily   predniSONE  10 mg Oral Q breakfast   pyridostigmine  60  mg Oral QID   topiramate  200 mg Oral Q1500   traZODone  50 mg Oral QHS   Continuous Infusions:  citrate dextrose        LOS: 7 days   Charlynne Cousins  [facility]  10/28/2021, 11:31 AM

## 2021-10-28 NOTE — Progress Notes (Signed)
Pt refused NIF/VC at this time wanted to sleep

## 2021-10-28 NOTE — Progress Notes (Signed)
Pt completed Therapeutic Plasma Exchange without issue. Pt tolerated well and no complications.

## 2021-10-29 DIAGNOSIS — G7001 Myasthenia gravis with (acute) exacerbation: Secondary | ICD-10-CM | POA: Diagnosis not present

## 2021-10-29 LAB — GLUCOSE, CAPILLARY
Glucose-Capillary: 98 mg/dL (ref 70–99)
Glucose-Capillary: 207 mg/dL — ABNORMAL HIGH (ref 70–99)
Glucose-Capillary: 127 mg/dL — ABNORMAL HIGH (ref 70–99)
Glucose-Capillary: 147 mg/dL — ABNORMAL HIGH (ref 70–99)

## 2021-10-29 MED ORDER — POLYETHYLENE GLYCOL 3350 17 G PO PACK
17.0000 g | PACK | Freq: Two times a day (BID) | ORAL | Status: AC
  Administered 2021-10-29 – 2021-10-30 (×3): 17 g via ORAL
  Filled 2021-10-29 (×4): qty 1

## 2021-10-29 NOTE — Progress Notes (Signed)
[facility] PROGRESS NOTE    Progress Note  Jade Mathis  YDX:[id number] DOB: [birth year] DOA: 10/20/2021 PCP: Hoyt Koch, MD     Brief Narrative:   Jade Mathis is an 46 y.o. 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 on chronic steroids, Azathioprine and Mestinon presents to [place] with history of 3 weeks of gradual onset progressive weakness shortness of breath, double vision, difficulty masticating and chest pressure continues were consistent with myasthenia gravis flare, in the ER she was evaluated by neuro who recommended plasma exchange and admission  Consultant: Neurology Assessment/Plan:   Acute Myasthenia exacerbation [facility]): Undergoing plasmapheresis by neurology initiated on 10/24/2021 every other day for 5 sessions Avoid medications that can trigger myasthenia, quinolones and macrolides quinine and cautions with calcium channel blockers and beta-blockers and statins. Continue NIF and vital capacity every 6 hours. Received 2 Plex treatment. Neurology on board and awaiting further recommendations.  Narcotic/multidrug induced obtundation on 07/22/2021: Patient has insisted she takes Oxy 15 mg 4 times a day which she cannot tolerate due to depression of her respiratory status and hypotension.  Therefore oxycodone was stopped. Review of her records has shown she uses chronic benzos, due to her episode of hypotension these were held. Thus far she has exhibited no withdrawal of narcotics or benzos.  Persistent hypotension: Avoid hypertensive medications or medications that can drop her blood pressure. She is currently asymptomatic. ACE inhibitor has been discontinued. Blood pressure continues to be borderline low she will have to be discharged off her medications.  Anxiety  disorder/bipolar disorder: Narcotics and benzo were held due to intermittent episodes of somnolence and hypotension she has experienced no withdrawal. She has been off her medications since the fifth of this month.  Right upper extremity edema: Upper extremity Doppler was negative for DVT appears to be due to saline infiltration improved with elevation.  Diabetes mellitus type 2: Long-acting insulin plus sliding scale, blood glucose under excellent control.  Normocytic anemia: Due to anemia of chronic disease follow-up with PCP as an outpatient.  Asthma: Noted.  Hypothyroidism: Continue Synthroid.   DVT prophylaxis: lovenox Family Communication:none Status is: Inpatient Remains inpatient appropriate because: Acute myasthenia gravis exacerbation.    Code Status:     Code Status Orders  (From admission, onward)           Start     Ordered   10/21/21 0720  Full code  Continuous        10/21/21 0719           Code Status History     Date Active Date Inactive Code Status Order ID Comments User Context   08/26/2021 1729 08/30/2021 2107 Full Code [id number]  Nita Sells, MD ED   08/14/2020 0730 08/16/2020 2321 Full Code [id number]  Norval Morton, MD ED   09/23/2016 1507 09/25/2016 1829 Full Code [id number]  Waldemar Dickens, MD Inpatient   09/22/2016 1017 09/23/2016 1507 Full Code [id number]  Shirley, [place], [place] Inpatient   06/13/2016 2254 06/14/2016 2054 Full Code [id number]  Sherlyn Hay, DO Inpatient   11/04/2015 0029 11/04/2015 1232 Full Code [id number]  Tresea Mall, CNM Inpatient   09/10/2014 0011 09/13/2014 1352 Full Code [id number]  Toy Baker, MD Inpatient   10/11/2012 2032 10/12/2012 0321 Full Code [id number]  Delaine Lame ED  IV Access:   Peripheral IV   Procedures and diagnostic studies:   No results found.   Medical Consultants:   None.   Subjective:    [name] no complaints  Objective:     Vitals:   10/29/21 0051 10/29/21 0454 10/29/21 0500 10/29/21 0700  BP: 93/65 (!) 104/51  106/70  Pulse: 66 78    Resp: '18 15  16  '$ Temp: 98.2 F (36.8 C) 98.4 F (36.9 C)  98.6 F (37 C)  TempSrc: Oral Oral  Oral  SpO2: 100% 100%  100%  Weight:   79.3 kg   Height:       SpO2: 100 % O2 Flow Rate (L/min): 2 L/min   Intake/Output Summary (Last 24 hours) at 10/29/2021 0749 Last data filed at 10/28/2021 1830 Gross per 24 hour  Intake 600 ml  Output --  Net 600 ml    Filed Weights   10/28/21 0500 10/28/21 1544 10/29/21 0500  Weight: 78.7 kg 78 kg 79.3 kg    Exam: General exam: In no acute distress. Respiratory system: Good air movement and clear to auscultation. Cardiovascular system: S1 & S2 heard, RRR. No JVD. Gastrointestinal system: Abdomen is nondistended, soft and nontender.  Extremities: No pedal edema. Skin: No rashes, lesions or ulcers Psychiatry: Judgement and insight appear normal. Mood & affect appropriate. Data Reviewed:    Labs: Basic Metabolic Panel: Recent Labs  Lab 10/24/21 1243 10/24/21 2108 10/25/21 0331 10/26/21 0300 10/28/21 1552  NA 143 138 141 141 141  K 3.4* 4.2 3.6 3.5 3.8  CL 112* 114* 118* 116* 111  CO2 21* 21* 19* 20* 25  GLUCOSE 139* 126* 125* 111* 167*  BUN '6 8 10 13 15  '$ CREATININE 0.58 0.60 0.50 0.60 0.68  CALCIUM 8.2* 8.4* 8.2* 8.4* 8.8*  MG  --   --   --   --  2.0  PHOS  --   --   --  2.8  --     GFR Estimated Creatinine Clearance: 93.4 mL/min (by C-G formula based on SCr of 0.68 mg/dL). Liver Function Tests: Recent Labs  Lab 10/23/21 0716 10/25/21 0331 10/26/21 0300  AST 12* 8*  --   ALT 12 7  --   ALKPHOS 52 21*  --   BILITOT 0.5 0.4  --   PROT 5.3* 4.7*  --   ALBUMIN 2.3* 3.4* 3.2*    No results for input(s): "LIPASE", "AMYLASE" in the last 168 hours.  Recent Labs  Lab 10/23/21 0716  AMMONIA 29    Coagulation profile No results for input(s): "INR", "PROTIME" in the last 168 hours. COVID-19  Labs  No results for input(s): "DDIMER", "FERRITIN", "LDH", "CRP" in the last 72 hours.  Lab Results  Component Value Date   SARSCOV2NAA NEGATIVE 11/15/2020   [place] NEGATIVE 08/14/2020   [place] Not Detected 06/06/2018    CBC: Recent Labs  Lab 10/23/21 0716 10/24/21 1243 10/25/21 0331 10/26/21 0300  WBC 6.4 6.3 6.6 7.6  HGB 9.2* 9.5* 9.0* 9.1*  HCT 30.7* 32.6* 29.9* 29.8*  MCV 86.2 89.3 87.2 86.9  PLT 194 208 177 185    Cardiac Enzymes: No results for input(s): "CKTOTAL", "CKMB", "CKMBINDEX", "TROPONINI" in the last 168 hours. BNP (last 3 results) No results for input(s): "PROBNP" in the last 8760 hours. CBG: Recent Labs  Lab 10/28/21 0633 10/28/21 1114 10/28/21 1539 10/28/21 1842 10/29/21 0728  GLUCAP 100* 175* 226* 189* 98    D-Dimer: No results for input(s): "DDIMER"  in the last 72 hours. Hgb A1c: No results for input(s): "HGBA1C" in the last 72 hours. Lipid Profile: No results for input(s): "CHOL", "HDL", "LDLCALC", "TRIG", "CHOLHDL", "LDLDIRECT" in the last 72 hours. Thyroid function studies: No results for input(s): "TSH", "T4TOTAL", "T3FREE", "THYROIDAB" in the last 72 hours.  Invalid input(s): "FREET3" Anemia work up: No results for input(s): "VITAMINB12", "FOLATE", "FERRITIN", "TIBC", "IRON", "RETICCTPCT" in the last 72 hours. Sepsis Labs: Recent Labs  Lab 10/23/21 0716 10/24/21 1243 10/25/21 0331 10/26/21 0300  WBC 6.4 6.3 6.6 7.6    Microbiology No results found for this or any previous visit (from the past 240 hour(s)).   Medications:    azaTHIOprine  150 mg Oral q AM   Chlorhexidine Gluconate Cloth  6 each Topical Daily   DULoxetine  60 mg Oral Daily   heparin  5,000 Units Subcutaneous Q8H   insulin aspart  0-9 Units Subcutaneous TID WC   levothyroxine  175 mcg Oral Daily   mometasone-formoterol  2 puff Inhalation BID   pantoprazole  40 mg Oral Daily   predniSONE  10 mg Oral Q breakfast   pyridostigmine  60 mg Oral  QID   topiramate  200 mg Oral Q1500   traZODone  50 mg Oral QHS   Continuous Infusions:  citrate dextrose     citrate dextrose        LOS: 8 days   Charlynne Cousins  [facility]  10/29/2021, 7:49 AM

## 2021-10-29 NOTE — Progress Notes (Addendum)
Subjective: Patient states that she feels stronger after first two PLEX treatments. She was able to ambulate in her room this morning. On exam she is fatigued after her self care activities and she states that she hasn't been sleeping well. She does agree to do her NIF and VC the next time it is scheduled.   Objective: Current vital signs: BP 106/70 (BP Location: Right Leg)   Pulse 78   Temp 98.6 F (37 C) (Oral)   Resp 16   Ht '5\' 6"'$  (1.676 m)   Wt 79.3 kg   LMP 10/13/2021   SpO2 100%   BMI 28.21 kg/m  Vital signs in last 24 hours: Temp:  [97.5 F (36.4 C)-98.7 F (37.1 C)] 98.6 F (37 C) (08/12 0700) Pulse Rate:  [66-89] 78 (08/12 0454) Resp:  [15-20] 16 (08/12 0700) BP: (70-124)/(37-70) 106/70 (08/12 0700) SpO2:  [96 %-100 %] 100 % (08/12 0700) Weight:  [78 kg-79.3 kg] 79.3 kg (08/12 0500)  Intake/Output from previous day: 08/11 0701 - 08/12 0700 In: 600 [P.O.:600] Out: -  Intake/Output this shift: No intake/output data recorded. Nutritional status:  Diet Order             Diet Carb Modified Fluid consistency: Thin; Room service appropriate? Yes  Diet effective now                  HEENT: [place]/AT Lungs: Respirations unlabored Ext: No edema  Neurologic Exam: Ment: Awake and alert. Speech fluent with intact comprehension. Pleasant and cooperative.  CN: Fixates and tracks normally. EOMI. Transverse smile (aka "myasthenic snarl"). Phonation intact.  Motor: 4/5 BUE and BLE. No spasticity noted.  Sensory: Intact to touch x 4 Reflexes: Normoactive x 4 Cerebellar: No ataxia noted Gait: Deferred  Lab Results: Results for orders placed or performed during the hospital encounter of 10/20/21 (from the past 48 hour(s))  Glucose, capillary     Status: Abnormal   Collection Time: 10/27/21 11:22 AM  Result Value Ref Range   Glucose-Capillary 131 (H) 70 - 99 mg/dL    Comment: Glucose reference range applies only to samples taken after fasting for at least 8 hours.   Glucose, capillary     Status: Abnormal   Collection Time: 10/27/21  3:57 PM  Result Value Ref Range   Glucose-Capillary 172 (H) 70 - 99 mg/dL    Comment: Glucose reference range applies only to samples taken after fasting for at least 8 hours.  Glucose, capillary     Status: Abnormal   Collection Time: 10/27/21  9:31 PM  Result Value Ref Range   Glucose-Capillary 111 (H) 70 - 99 mg/dL    Comment: Glucose reference range applies only to samples taken after fasting for at least 8 hours.  Glucose, capillary     Status: Abnormal   Collection Time: 10/28/21  6:33 AM  Result Value Ref Range   Glucose-Capillary 100 (H) 70 - 99 mg/dL    Comment: Glucose reference range applies only to samples taken after fasting for at least 8 hours.  Glucose, capillary     Status: Abnormal   Collection Time: 10/28/21 11:14 AM  Result Value Ref Range   Glucose-Capillary 175 (H) 70 - 99 mg/dL    Comment: Glucose reference range applies only to samples taken after fasting for at least 8 hours.  Glucose, capillary     Status: Abnormal   Collection Time: 10/28/21  3:39 PM  Result Value Ref Range   Glucose-Capillary 226 (H) 70 - 99  mg/dL    Comment: Glucose reference range applies only to samples taken after fasting for at least 8 hours.  Basic metabolic panel     Status: Abnormal   Collection Time: 10/28/21  3:52 PM  Result Value Ref Range   Sodium 141 135 - 145 mmol/L   Potassium 3.8 3.5 - 5.1 mmol/L   Chloride 111 98 - 111 mmol/L   CO2 25 22 - 32 mmol/L   Glucose, Bld 167 (H) 70 - 99 mg/dL    Comment: Glucose reference range applies only to samples taken after fasting for at least 8 hours.   BUN 15 6 - 20 mg/dL   Creatinine, Ser 0.68 0.44 - 1.00 mg/dL   Calcium 8.8 (L) 8.9 - 10.3 mg/dL   GFR, Estimated >60 >60 mL/min    Comment: (NOTE) Calculated using the CKD-EPI Creatinine Equation (2021)    Anion gap 5 5 - 15    Comment: Performed at [facility] [address], [place], [place] [address]   Magnesium     Status: None   Collection Time: 10/28/21  3:52 PM  Result Value Ref Range   Magnesium 2.0 1.7 - 2.4 mg/dL    Comment: Performed at [facility] [address] [place], [place] [address]  Glucose, capillary     Status: Abnormal   Collection Time: 10/28/21  6:42 PM  Result Value Ref Range   Glucose-Capillary 189 (H) 70 - 99 mg/dL    Comment: Glucose reference range applies only to samples taken after fasting for at least 8 hours.  Glucose, capillary     Status: None   Collection Time: 10/29/21  7:28 AM  Result Value Ref Range   Glucose-Capillary 98 70 - 99 mg/dL    Comment: Glucose reference range applies only to samples taken after fasting for at least 8 hours.   *Note: Due to a large number of results and/or encounters for the requested time period, some results have not been displayed. A complete set of results can be found in Results Review.    No results found for this or any previous visit (from the past 240 hour(s)).  Lipid Panel No results for input(s): "CHOL", "TRIG", "HDL", "CHOLHDL", "VLDL", "LDLCALC" in the last 72 hours.  Studies/Results: No results found.  Medications: Scheduled:  azaTHIOprine  150 mg Oral q AM   Chlorhexidine Gluconate Cloth  6 each Topical Daily   DULoxetine  60 mg Oral Daily   heparin  5,000 Units Subcutaneous Q8H   insulin aspart  0-9 Units Subcutaneous TID WC   levothyroxine  175 mcg Oral Daily   mometasone-formoterol  2 puff Inhalation BID   pantoprazole  40 mg Oral Daily   predniSONE  10 mg Oral Q breakfast   pyridostigmine  60 mg Oral QID   topiramate  200 mg Oral Q1500   traZODone  50 mg Oral QHS   Continuous:  citrate dextrose     citrate dextrose     Assessment: 46 y.o. female with PMHs of MG (diagnosed in 1997 on RNS and serology and s/p thymectomy) on prednisone 10 mg, azathrioprine 150 mg/d, and mestinon 60 mg QID who presented with worsening weakness over the last [redacted] weeks along with shortness of breath  with exertion and some tightness in her chest.  She reports has been having trouble chewing with swallowing. She reports IVIG has worked well for her in the past. She has tried PLEX in the past when she was  diagnosed about 30 years ago. We discussed potential risk of worsening of ACS with IVIG, especially since she was having significant chest tightness. She opted to go with PLEX. We also spoke with her and discussed that her Lisinopril needs to be held for PLEX. Also discused details along with risks and benefits of PLEX. Tunneled catheter placed 8/4. PLEX started on 8/7  - Symptomatically improved after 2 sessions of PLEX - Exam today: Fatigued with activity, Mild weakness x 4. Myasthenic snarl noted.    Recommendations: - Has had 3 PLEX sessions as of today's session. Will continue every other day x 5 sessions total. Next session is on Monday. - Continue holding Lisinopril. No ACE inhibitors or ARBs while on PLEX. - Every 6 hours NIFs and VC - Consider elective intubation for respiratory compromise if VC falls below 15 to 20 mL/kg and or NIFs falls below -20cm/H2O. If poor effort and not sure, can always get ABG to assess for CO2 retention. Also consider elective intubation for airway protection if patient develops difficulty managing secretions - Continue home Mestinon PO '60mg'$  QID. If unable to swallow, can switch from PO to IV.('30mg'$  PO is equivalent to '1mg'$  IV). Continue Prednisone '10mg'$  daily and Azathioprine '150mg'$  daily. - Medications that may worsen or trigger MG exacerbation: Class IA antiarrhythmics, magnesium, flouroquinolones, macrolides, aminoglycosides, penicillamine, curare, interferon alpha, botox, quinine. Use with caution: calcium channel blocker, beta blockers and statins.   LOS: 8 days    Janine Ores, [place], FNP-BC [facility] Pager: [phone]   Electronically signed: Dr. Kerney Elbe

## 2021-10-29 NOTE — Progress Notes (Signed)
With good effort and technique VC = 1.52L, NIF = -21cm H20

## 2021-10-30 DIAGNOSIS — G7001 Myasthenia gravis with (acute) exacerbation: Secondary | ICD-10-CM | POA: Diagnosis not present

## 2021-10-30 LAB — GLUCOSE, CAPILLARY
Glucose-Capillary: 131 mg/dL — ABNORMAL HIGH (ref 70–99)
Glucose-Capillary: 149 mg/dL — ABNORMAL HIGH (ref 70–99)
Glucose-Capillary: 162 mg/dL — ABNORMAL HIGH (ref 70–99)
Glucose-Capillary: 159 mg/dL — ABNORMAL HIGH (ref 70–99)

## 2021-10-30 NOTE — Progress Notes (Signed)
Pt with excellent effort on VC = 2.28L and NIF = -40 cm H20.

## 2021-10-30 NOTE — Progress Notes (Signed)
[facility] PROGRESS NOTE    Progress Note  Goldia Ligman  [id number]:[id number] DOB: 11-Oct-1975 DOA: 10/20/2021 PCP: Hoyt Koch, MD     Brief Narrative:   Mychael Soots is an 46 y.o. 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 on chronic steroids, Azathioprine and Mestinon presents to [place] with history of 3 weeks of gradual onset progressive weakness shortness of breath, double vision, difficulty masticating and chest pressure continues were consistent with myasthenia gravis flare, in the ER she was evaluated by neuro who recommended plasma exchange and admission  Consultant: Neurology Assessment/Plan:   Acute Myasthenia exacerbation [facility]): Undergoing plasmapheresis by neurology initiated on 10/24/2021 every other day for 5 sessions. Tunneled catheter placed on 10/21/2021 Avoid medications that can trigger myasthenia, quinolones and macrolides quinine and cautions with calcium channel blockers and beta-blockers and statins. Continue NIF and vital capacity every 6 hours. Has received 3 Plex treatment, none yesterday. Neurology on board and awaiting further recommendations. We will need to avoid lisinopril and Plex treatment, scheduled for her next treatment on 10/31/2021.  Narcotic/multidrug induced obtundation on 07/22/2021: Patient has insisted she takes Oxy 15 mg 4 times a day which she cannot tolerate due to depression of her respiratory status and hypotension.  Therefore oxycodone was stopped. Review of her records has shown she uses chronic benzos, due to her episode of hypotension these were held. Thus far she has exhibited no withdrawal of narcotics or benzos.  Persistent hypotension: Avoid hypertensive medications or medications that can drop her blood pressure. She is currently  asymptomatic. ACE inhibitor has been discontinued. Blood pressure continues to be borderline low she will have to be discharged off her medications.  Anxiety disorder/bipolar disorder: Narcotics and benzo were held due to intermittent episodes of somnolence and hypotension she has experienced no withdrawal. She has been off her medications since the fifth of this month.  Right upper extremity edema: Upper extremity Doppler was negative for DVT, now resolved.  Diabetes mellitus type 2: Long-acting insulin plus sliding scale, blood glucose under excellent control.  Normocytic anemia: Due to anemia of chronic disease follow-up with PCP as an outpatient.  Asthma: Noted.  Hypothyroidism: Continue Synthroid.   DVT prophylaxis: lovenox Family Communication:none Status is: Inpatient Remains inpatient appropriate because: Acute myasthenia gravis exacerbation.    Code Status:     Code Status Orders  (From admission, onward)           Start     Ordered   10/21/21 0720  Full code  Continuous        10/21/21 0719           Code Status History     Date Active Date Inactive Code Status Order ID Comments User Context   08/26/2021 1729 08/30/2021 2107 Full Code [id number]  Nita Sells, MD ED   08/14/2020 0730 08/16/2020 2321 Full Code [id number]  Norval Morton, MD ED   09/23/2016 1507 09/25/2016 1829 Full Code [id number]  Waldemar Dickens, MD Inpatient   09/22/2016 1017 09/23/2016 1507 Full Code [id number]  Shirley, [place], DO Inpatient   06/13/2016 2254 06/14/2016 2054 Full Code [id number]  Sherlyn Hay, DO Inpatient   11/04/2015 0029 11/04/2015 1232 Full Code [id number]  Tresea Mall, CNM Inpatient   09/10/2014 0011 09/13/2014 1352 Full Code [id number]  Toy Baker, MD Inpatient   10/11/2012 2032  10/12/2012 0321 Full Code [id number]  Montine Circle, PA-C ED         IV Access:   Peripheral IV   Procedures and diagnostic studies:   No results  found.   Medical Consultants:   None.   Subjective:    Zayra Latise Zilka no complaints.  Objective:    Vitals:   10/29/21 2326 10/30/21 0300 10/30/21 0630 10/30/21 0729  BP: (!) 99/52 (!) 110/59  102/61  Pulse: 71 (!) 59    Resp: '16 12  17  '$ Temp: 98.1 F (36.7 C)   98.3 F (36.8 C)  TempSrc: Oral   Oral  SpO2: 98%   99%  Weight:   78.7 kg   Height:       SpO2: 99 % O2 Flow Rate (L/min): 2 L/min  No intake or output data in the 24 hours ending 10/30/21 0954  Filed Weights   10/28/21 1544 10/29/21 0500 10/30/21 0630  Weight: 78 kg 79.3 kg 78.7 kg    Exam: General exam: In no acute distress. Respiratory system: Good air movement and clear to auscultation. Cardiovascular system: S1 & S2 heard, RRR. No JVD. Gastrointestinal system: Abdomen is nondistended, soft and nontender.  Extremities: No pedal edema. Skin: No rashes, lesions or ulcers Psychiatry: Judgement and insight appear normal. Mood & affect appropriate. Data Reviewed:    Labs: Basic Metabolic Panel: Recent Labs  Lab 10/24/21 1243 10/24/21 2108 10/25/21 0331 10/26/21 0300 10/28/21 1552  NA 143 138 141 141 141  K 3.4* 4.2 3.6 3.5 3.8  CL 112* 114* 118* 116* 111  CO2 21* 21* 19* 20* 25  GLUCOSE 139* 126* 125* 111* 167*  BUN '6 8 10 13 15  '$ CREATININE 0.58 0.60 0.50 0.60 0.68  CALCIUM 8.2* 8.4* 8.2* 8.4* 8.8*  MG  --   --   --   --  2.0  PHOS  --   --   --  2.8  --     GFR Estimated Creatinine Clearance: 93.1 mL/min (by C-G formula based on SCr of 0.68 mg/dL). Liver Function Tests: Recent Labs  Lab 10/25/21 0331 10/26/21 0300  AST 8*  --   ALT 7  --   ALKPHOS 21*  --   BILITOT 0.4  --   PROT 4.7*  --   ALBUMIN 3.4* 3.2*    No results for input(s): "LIPASE", "AMYLASE" in the last 168 hours.  No results for input(s): "AMMONIA" in the last 168 hours.  Coagulation profile No results for input(s): "INR", "PROTIME" in the last 168 hours. COVID-19 Labs  No results for input(s):  "DDIMER", "FERRITIN", "LDH", "CRP" in the last 72 hours.  Lab Results  Component Value Date   SARSCOV2NAA NEGATIVE 11/15/2020   [place] NEGATIVE 08/14/2020   [place] Not Detected 06/06/2018    CBC: Recent Labs  Lab 10/24/21 1243 10/25/21 0331 10/26/21 0300  WBC 6.3 6.6 7.6  HGB 9.5* 9.0* 9.1*  HCT 32.6* 29.9* 29.8*  MCV 89.3 87.2 86.9  PLT 208 177 185    Cardiac Enzymes: No results for input(s): "CKTOTAL", "CKMB", "CKMBINDEX", "TROPONINI" in the last 168 hours. BNP (last 3 results) No results for input(s): "PROBNP" in the last 8760 hours. CBG: Recent Labs  Lab 10/29/21 0728 10/29/21 1139 10/29/21 1551 10/29/21 2136 10/30/21 0612  GLUCAP 98 207* 127* 147* 131*    D-Dimer: No results for input(s): "DDIMER" in the last 72 hours. Hgb A1c: No results for input(s): "HGBA1C" in the last 72 hours. Lipid Profile:  No results for input(s): "CHOL", "HDL", "LDLCALC", "TRIG", "CHOLHDL", "LDLDIRECT" in the last 72 hours. Thyroid function studies: No results for input(s): "TSH", "T4TOTAL", "T3FREE", "THYROIDAB" in the last 72 hours.  Invalid input(s): "FREET3" Anemia work up: No results for input(s): "VITAMINB12", "FOLATE", "FERRITIN", "TIBC", "IRON", "RETICCTPCT" in the last 72 hours. Sepsis Labs: Recent Labs  Lab 10/24/21 1243 10/25/21 0331 10/26/21 0300  WBC 6.3 6.6 7.6    Microbiology No results found for this or any previous visit (from the past 240 hour(s)).   Medications:    azaTHIOprine  150 mg Oral q AM   Chlorhexidine Gluconate Cloth  6 each Topical Daily   DULoxetine  60 mg Oral Daily   heparin  5,000 Units Subcutaneous Q8H   insulin aspart  0-9 Units Subcutaneous TID WC   levothyroxine  175 mcg Oral Daily   mometasone-formoterol  2 puff Inhalation BID   pantoprazole  40 mg Oral Daily   polyethylene glycol  17 g Oral BID   predniSONE  10 mg Oral Q breakfast   pyridostigmine  60 mg Oral QID   topiramate  200 mg Oral Q1500   traZODone  50  mg Oral QHS   Continuous Infusions:  citrate dextrose     citrate dextrose        LOS: 9 days   Charlynne Cousins  [facility]  10/30/2021, 9:54 AM

## 2021-10-30 NOTE — Progress Notes (Signed)
Mobility Specialist Progress Note    10/30/21 1408  Mobility  Activity Ambulated with assistance in hallway  Level of Assistance Standby assist, set-up cues, supervision of patient - no hands on  Assistive Device Front wheel walker  Distance Ambulated (ft) 210 ft  Activity Response Tolerated well  $Mobility charge 1 Mobility   Pt received in doorway and agreeable. No complaints on walk. Returned to [facility] and encouraged to pull string for assistance when done.    Hildred Alamin Mobility Specialist

## 2021-10-31 ENCOUNTER — Ambulatory Visit: Payer: Medicare Other | Admitting: Family Medicine

## 2021-10-31 DIAGNOSIS — G7001 Myasthenia gravis with (acute) exacerbation: Secondary | ICD-10-CM | POA: Diagnosis not present

## 2021-10-31 LAB — BASIC METABOLIC PANEL
Anion gap: 6 (ref 5–15)
BUN: 26 mg/dL — ABNORMAL HIGH (ref 6–20)
CO2: 25 mmol/L (ref 22–32)
Calcium: 9 mg/dL (ref 8.9–10.3)
Chloride: 109 mmol/L (ref 98–111)
Creatinine, Ser: 0.95 mg/dL (ref 0.44–1.00)
GFR, Estimated: >60 mL/min (ref >60)
Glucose, Bld: 145 mg/dL — ABNORMAL HIGH (ref 70–99)
Potassium: 3.5 mmol/L (ref 3.5–5.1)
Sodium: 140 mmol/L (ref 135–145)

## 2021-10-31 LAB — GLUCOSE, CAPILLARY
Glucose-Capillary: 158 mg/dL — ABNORMAL HIGH (ref 70–99)
Glucose-Capillary: 197 mg/dL — ABNORMAL HIGH (ref 70–99)
Glucose-Capillary: 158 mg/dL — ABNORMAL HIGH (ref 70–99)
Glucose-Capillary: 131 mg/dL — ABNORMAL HIGH (ref 70–99)

## 2021-10-31 LAB — POCT I-STAT, CHEM 8
BUN: 26 mg/dL — ABNORMAL HIGH (ref 6–20)
Calcium, Ion: 1.28 mmol/L (ref 1.15–1.40)
Chloride: 104 mmol/L (ref 98–111)
Creatinine, Ser: 1 mg/dL (ref 0.44–1.00)
Glucose, Bld: 145 mg/dL — ABNORMAL HIGH (ref 70–99)
HCT: 31 % — ABNORMAL LOW (ref 36.0–46.0)
Hemoglobin: 10.5 g/dL — ABNORMAL LOW (ref 12.0–15.0)
Potassium: 3.8 mmol/L (ref 3.5–5.1)
Sodium: 140 mmol/L (ref 135–145)
TCO2: 25 mmol/L (ref 22–32)

## 2021-10-31 MED ORDER — CALCIUM GLUCONATE-NACL 2-0.675 GM/100ML-% IV SOLN
INTRAVENOUS | Status: AC
  Administered 2021-10-31: 2000 mg
  Filled 2021-10-31: qty 100

## 2021-10-31 MED ORDER — CALCIUM GLUCONATE-NACL 2-0.675 GM/100ML-% IV SOLN
2.0000 g | Freq: Once | INTRAVENOUS | Status: AC
  Filled 2021-10-31: qty 100

## 2021-10-31 MED ORDER — DIPHENHYDRAMINE HCL 25 MG PO CAPS
25.0000 mg | ORAL_CAPSULE | Freq: Four times a day (QID) | ORAL | Status: DC | PRN
  Administered 2021-10-31: 25 mg via ORAL

## 2021-10-31 MED ORDER — ACD FORMULA A 0.73-2.45-2.2 GM/100ML VI SOLN
Status: AC
  Administered 2021-10-31: 1000 mL
  Filled 2021-10-31: qty 1000

## 2021-10-31 MED ORDER — CALCIUM CARBONATE ANTACID 500 MG PO CHEW
CHEWABLE_TABLET | ORAL | Status: AC
  Administered 2021-10-31: 400 mg
  Filled 2021-10-31: qty 2

## 2021-10-31 MED ORDER — HEPARIN SODIUM (PORCINE) 1000 UNIT/ML IJ SOLN
1000.0000 [IU] | Freq: Once | INTRAMUSCULAR | Status: DC
  Filled 2021-10-31 (×2): qty 1

## 2021-10-31 MED ORDER — SODIUM CHLORIDE 0.9 % IV SOLN
INTRAVENOUS | Status: AC
  Filled 2021-10-31 (×3): qty 200

## 2021-10-31 MED ORDER — ACETAMINOPHEN 325 MG PO TABS
650.0000 mg | ORAL_TABLET | ORAL | Status: DC | PRN
  Administered 2021-10-31: 650 mg via ORAL

## 2021-10-31 MED ORDER — ACD FORMULA A 0.73-2.45-2.2 GM/100ML VI SOLN
1000.0000 mL | Status: DC
  Filled 2021-10-31: qty 1000

## 2021-10-31 MED ORDER — HYDROCORTISONE ACETATE 25 MG RE SUPP
25.0000 mg | Freq: Two times a day (BID) | RECTAL | Status: DC
  Administered 2021-10-31 – 2021-11-01 (×2): 25 mg via RECTAL
  Filled 2021-10-31 (×8): qty 1

## 2021-10-31 NOTE — Progress Notes (Signed)
Pt refused Dulera BID as well as NIF/VC.

## 2021-10-31 NOTE — Progress Notes (Signed)
[facility] PROGRESS NOTE    Progress Note  Jade Mathis  [id number]:[id number] DOB: 1975/08/10 DOA: 10/20/2021 PCP: Hoyt Koch, MD     Brief Narrative:   Jade Mathis is an 46 y.o. 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 Tunneled catheter placed on 10/21/2021 Avoid medications that can trigger myasthenia, quinolones and macrolides quinine and cautions with calcium channel blockers and beta-blockers and statins. Continue NIF and vital capacity every 6 hours. Neurology on board and awaiting further recommendations. We will need to avoid lisinopril and Plex treatment, last Plex treatment on 11/02/2021.  Narcotic/multidrug induced obtundation on 07/22/2021: Patient has insisted she takes Oxy 15 mg 4 times a day which she cannot tolerate due to depression of her respiratory status and hypotension.  Therefore oxycodone was stopped. Review of her records has shown she uses chronic benzos, due to her episode of hypotension these were held. Thus far she has exhibited no withdrawal of narcotics or benzos. She needs to go home off benzodiazepine and off all types of narcotics. She has no indication for it.  Persistent hypotension: Avoid hypertensive medications or medications that can drop  her blood pressure. She is currently asymptomatic. ACE inhibitor has been discontinued. Blood pressure continues to be borderline low she will have to be discharged off her medications.  Anxiety disorder/bipolar disorder: Narcotics and benzo were held due to intermittent episodes of somnolence and hypotension she has experienced no withdrawal. She has been off her medications since the fifth of this month.  Right upper extremity edema: Upper extremity Doppler was negative for DVT, now resolved.  Diabetes mellitus type 2: Long-acting insulin plus sliding scale, blood glucose under excellent control.  Normocytic anemia: Due to anemia of chronic disease follow-up with PCP as an outpatient.  Asthma: Noted.  Hypothyroidism: Continue Synthroid.   DVT prophylaxis: lovenox Family Communication:none Status is: Inpatient Remains inpatient appropriate because: Acute myasthenia gravis exacerbation.    Code Status:     Code Status Orders  (From admission, onward)           Start     Ordered   10/21/21 0720  Full code  Continuous        10/21/21 0719           Code Status History     Date Active Date Inactive Code Status Order ID Comments User Context   08/26/2021 1729 08/30/2021 2107 Full Code [id number]  Nita Sells, MD ED   08/14/2020 0730 08/16/2020 2321 Full Code [id number]  Norval Morton, MD ED   09/23/2016 1507 09/25/2016 1829 Full Code [id number]  Waldemar Dickens, MD Inpatient   09/22/2016 1017 09/23/2016 1507 Full Code [id number]  Shirley, [place], [place] Inpatient   06/13/2016 2254 06/14/2016 2054 Full Code [id number]  Sherlyn Hay, DO Inpatient   11/04/2015 0029 11/04/2015 1232 Full Code [id number]  Tresea Mall, CNM Inpatient   09/10/2014 0011 09/13/2014 1352 Full Code  [id number]  Toy Baker, MD Inpatient   10/11/2012 2032 10/12/2012 0321 Full Code [id number]  Montine Circle, PA-C ED         IV Access:   Peripheral IV   Procedures and  diagnostic studies:   No results found.   Medical Consultants:   None.   Subjective:    Lyndel Pleasure relates he feels stronger  Objective:    Vitals:   10/30/21 2130 10/31/21 0010 10/31/21 0400 10/31/21 0500  BP: (!) 143/74 (!) 92/57 109/65   Pulse: 88  76   Resp: 18 16 (!) 24 18  Temp: 98 F (36.7 C)  98.5 F (36.9 C)   TempSrc: Oral  Oral   SpO2: 98%  100%   Weight:   78.4 kg   Height:       SpO2: 100 % O2 Flow Rate (L/min): 2 L/min   Intake/Output Summary (Last 24 hours) at 10/31/2021 0846 Last data filed at 10/31/2021 0317 Gross per 24 hour  Intake 2080 ml  Output 0 ml  Net 2080 ml    Filed Weights   10/29/21 0500 10/30/21 0630 10/31/21 0400  Weight: 79.3 kg 78.7 kg 78.4 kg    Exam: General exam: In no acute distress. Respiratory system: Good air movement and clear to auscultation. Cardiovascular system: S1 & S2 heard, RRR. No JVD. Gastrointestinal system: Abdomen is nondistended, soft and nontender.  Extremities: No pedal edema. Skin: No rashes, lesions or ulcers Psychiatry: Judgement and insight appear normal. Mood & affect appropriate. Data Reviewed:    Labs: Basic Metabolic Panel: Recent Labs  Lab 10/24/21 1243 10/24/21 2108 10/25/21 0331 10/26/21 0300 10/28/21 1552  NA 143 138 141 141 141  K 3.4* 4.2 3.6 3.5 3.8  CL 112* 114* 118* 116* 111  CO2 21* 21* 19* 20* 25  GLUCOSE 139* 126* 125* 111* 167*  BUN '6 8 10 13 15  '$ CREATININE 0.58 0.60 0.50 0.60 0.68  CALCIUM 8.2* 8.4* 8.2* 8.4* 8.8*  MG  --   --   --   --  2.0  PHOS  --   --   --  2.8  --     GFR Estimated Creatinine Clearance: 92.8 mL/min (by C-G formula based on SCr of 0.68 mg/dL). Liver Function Tests: Recent Labs  Lab 10/25/21 0331 10/26/21 0300  AST 8*  --   ALT 7  --   ALKPHOS 21*  --   BILITOT 0.4  --   PROT 4.7*  --   ALBUMIN 3.4* 3.2*    No results for input(s): "LIPASE", "AMYLASE" in the last 168 hours.  No results for input(s): "AMMONIA" in the  last 168 hours.  Coagulation profile No results for input(s): "INR", "PROTIME" in the last 168 hours. COVID-19 Labs  No results for input(s): "DDIMER", "FERRITIN", "LDH", "CRP" in the last 72 hours.  Lab Results  Component Value Date   SARSCOV2NAA NEGATIVE 11/15/2020   [place] NEGATIVE 08/14/2020   [place] Not Detected 06/06/2018    CBC: Recent Labs  Lab 10/24/21 1243 10/25/21 0331 10/26/21 0300  WBC 6.3 6.6 7.6  HGB 9.5* 9.0* 9.1*  HCT 32.6* 29.9* 29.8*  MCV 89.3 87.2 86.9  PLT 208 177 185    Cardiac Enzymes: No results for input(s): "CKTOTAL", "CKMB", "CKMBINDEX", "TROPONINI" in the last 168 hours. BNP (last 3 results) No results for input(s): "PROBNP" in the last 8760 hours. CBG: Recent Labs  Lab 10/30/21 0612 10/30/21 1111 10/30/21 1614 10/30/21 2130 10/31/21 2355  GLUCAP  131* 149* 162* 159* 158*    D-Dimer: No results for input(s): "DDIMER" in the last 72 hours. Hgb A1c: No results for input(s): "HGBA1C" in the last 72 hours. Lipid Profile: No results for input(s): "CHOL", "HDL", "LDLCALC", "TRIG", "CHOLHDL", "LDLDIRECT" in the last 72 hours. Thyroid function studies: No results for input(s): "TSH", "T4TOTAL", "T3FREE", "THYROIDAB" in the last 72 hours.  Invalid input(s): "FREET3" Anemia work up: No results for input(s): "VITAMINB12", "FOLATE", "FERRITIN", "TIBC", "IRON", "RETICCTPCT" in the last 72 hours. Sepsis Labs: Recent Labs  Lab 10/24/21 1243 10/25/21 0331 10/26/21 0300  WBC 6.3 6.6 7.6    Microbiology No results found for this or any previous visit (from the past 240 hour(s)).   Medications:    azaTHIOprine  150 mg Oral q AM   calcium carbonate       Chlorhexidine Gluconate Cloth  6 each Topical Daily   DULoxetine  60 mg Oral Daily   heparin  5,000 Units Subcutaneous Q8H   heparin sodium (porcine)  1,000 Units Intracatheter Once   insulin aspart  0-9 Units Subcutaneous TID WC   levothyroxine  175 mcg Oral Daily    mometasone-formoterol  2 puff Inhalation BID   pantoprazole  40 mg Oral Daily   polyethylene glycol  17 g Oral BID   predniSONE  10 mg Oral Q breakfast   pyridostigmine  60 mg Oral QID   topiramate  200 mg Oral Q1500   traZODone  50 mg Oral QHS   Continuous Infusions:  albumin human 25 % 50 g in sodium chloride 0.9 %     calcium gluconate     calcium gluconate     citrate dextrose     citrate dextrose     citrate dextrose     citrate dextrose        LOS: 10 days   Charlynne Cousins  [facility]  10/31/2021, 8:46 AM

## 2021-10-31 NOTE — Plan of Care (Signed)

## 2021-11-01 ENCOUNTER — Ambulatory Visit: Payer: Medicare Other | Admitting: Family

## 2021-11-01 DIAGNOSIS — G7001 Myasthenia gravis with (acute) exacerbation: Secondary | ICD-10-CM | POA: Diagnosis not present

## 2021-11-01 LAB — GLUCOSE, CAPILLARY
Glucose-Capillary: 120 mg/dL — ABNORMAL HIGH (ref 70–99)
Glucose-Capillary: 160 mg/dL — ABNORMAL HIGH (ref 70–99)
Glucose-Capillary: 225 mg/dL — ABNORMAL HIGH (ref 70–99)
Glucose-Capillary: 156 mg/dL — ABNORMAL HIGH (ref 70–99)

## 2021-11-01 NOTE — Progress Notes (Signed)
Mobility Specialist - Progress Note   11/01/21 1212  Mobility  Activity Ambulated with assistance in hallway  Level of Assistance Contact guard assist, steadying assist  Assistive Device Front wheel walker  Distance Ambulated (ft) 390 ft  Activity Response Tolerated well  $Mobility charge 1 Mobility   Post-mobility: 90 HR, 108/70(76) BP  Pt was received in room and agreeable to mobility. No c/o pain throughout ambulation. Pt was returned to bed with all needs meet.  Larey Seat

## 2021-11-01 NOTE — Plan of Care (Signed)
  Problem: Education: Goal: Knowledge of General Education information will improve Description: Including pain rating scale, medication(s)/side effects and non-pharmacologic comfort measures Outcome: Progressing   Problem: Health Behavior/Discharge Planning: Goal: Ability to manage health-related needs will improve Outcome: Progressing   Problem: Activity: Goal: Risk for activity intolerance will decrease Outcome: Progressing   Problem: Elimination: Goal: Will not experience complications related to bowel motility Outcome: Progressing   Problem: Pain Managment: Goal: General experience of comfort will improve Outcome: Progressing   Problem: Skin Integrity: Goal: Risk for impaired skin integrity will decrease Outcome: Progressing

## 2021-11-01 NOTE — Progress Notes (Signed)
[facility] night cross cover note:  RN conveyed BP of 88/50, consistent with blood pressures earlier in evening and dayshift. Other VSS (HR 89, RR 20). No new symptoms. Confirmed that home anti-hypertensives are being held. No new orders from my standpoint at this time.     Babs Bertin, DO Hospitalist

## 2021-11-01 NOTE — Progress Notes (Signed)
Pt too sleepy at this time to demonstrate good effort with NIF and VC.

## 2021-11-01 NOTE — Progress Notes (Signed)
Pt ambulated in hallway with family and then independently with RW 400 ft. Pt then returned to room and is wanting to know when she can go home tomorrow. Payton Emerald, RN

## 2021-11-01 NOTE — Progress Notes (Signed)
[facility] PROGRESS NOTE    Progress Note  Jade Mathis  DPO:[id number] DOB: 1975-06-30 DOA: 10/20/2021 PCP: Hoyt Koch, MD     Brief Narrative:   Jade Mathis is an 46 y.o. 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 on chronic steroids, Azathioprine and Mestinon presents to [place] with history of 3 weeks of gradual onset progressive weakness shortness of breath, double vision, difficulty masticating and chest pressure continues were consistent with myasthenia gravis flare, in the ER she was evaluated by neuro who recommended plasma exchange and admission  Consultant: Neurology Assessment/Plan:   Acute Myasthenia exacerbation [facility]): Undergoing plasmapheresis by neurology initiated on 10/24/2021 every other day for 5 sessions. Tunneled catheter placed on 10/21/2021 Avoid medications that can trigger myasthenia, quinolones and macrolides quinine and cautions with calcium channel blockers and beta-blockers and statins. Neurology on board. We will need to avoid lisinopril and Plex treatment, last Plex treatment on 11/02/2021.  Narcotic/multidrug induced obtundation on 07/22/2021: Patient has insisted she takes Oxy 15 mg 4 times a day which she cannot tolerate due to depression of her respiratory status and hypotension.  Therefore oxycodone was stopped. Review of her records has shown she uses chronic benzos. Due to her episode of hypotension these were held. Thus far she has exhibited no withdrawal of narcotics or benzos. She needs to go home off benzodiazepine and off all types of narcotics.  Persistent hypotension: Avoid hypertensive medications or medications that can drop her blood pressure. She is currently asymptomatic. ACE inhibitor has been discontinued. Blood pressure continues  to be borderline low she will have to be discharged off her medications.  Anxiety disorder/bipolar disorder: Narcotics and benzo were held due to intermittent episodes of somnolence and hypotension she has experienced no withdrawal. She has been off her medications since the fifth of this month.  Right upper extremity edema: Upper extremity Doppler was negative for DVT, now resolved.  Diabetes mellitus type 2: Long-acting insulin plus sliding scale, blood glucose under excellent control.  Normocytic anemia: Due to anemia of chronic disease follow-up with PCP as an outpatient.  Asthma: Noted.  Hypothyroidism: Continue Synthroid.   DVT prophylaxis: lovenox Family Communication:none Status is: Inpatient Remains inpatient appropriate because: Acute myasthenia gravis exacerbation.    Code Status:     Code Status Orders  (From admission, onward)           Start     Ordered   10/21/21 0720  Full code  Continuous        10/21/21 0719           Code Status History     Date Active Date Inactive Code Status Order ID Comments User Context   08/26/2021 1729 08/30/2021 2107 Full Code [id number]  Nita Sells, MD ED   08/14/2020 0730 08/16/2020 2321 Full Code [id number]  Norval Morton, MD ED   09/23/2016 1507 09/25/2016 1829 Full Code [id number]  Waldemar Dickens, MD Inpatient   09/22/2016 1017 09/23/2016 1507 Full Code [id number]  Shirley, [place], DO Inpatient   06/13/2016 2254 06/14/2016 2054 Full Code [id number]  Sherlyn Hay, DO Inpatient   11/04/2015 0029 11/04/2015 1232 Full Code [id number]  Tresea Mall, CNM Inpatient   09/10/2014 0011 09/13/2014 1352 Full Code [id number]  Toy Baker, MD Inpatient   10/11/2012 2032 10/12/2012 0321 Full Code [id number]  Montine Circle,  PA-C ED         IV Access:   Peripheral IV   Procedures and diagnostic studies:   No results found.   Medical Consultants:   None.   Subjective:    Jade Mathis wants to go home, no complaints. Objective:    Vitals:   10/31/21 2230 11/01/21 0103 11/01/21 0330 11/01/21 0824  BP: (!) 88/50 (!) 94/55 (!) 99/58   Pulse: 89   70  Resp: '20 18 18 16  '$ Temp: (!) 97.5 F (36.4 C)     TempSrc: Oral     SpO2: 99% 99% 99%   Weight:      Height:       SpO2: 99 % O2 Flow Rate (L/min): 2 L/min  No intake or output data in the 24 hours ending 11/01/21 0905  Filed Weights   10/29/21 0500 10/30/21 0630 10/31/21 0400  Weight: 79.3 kg 78.7 kg 78.4 kg    Exam: General exam: In no acute distress. Respiratory system: Good air movement and clear to auscultation. Cardiovascular system: S1 & S2 heard, RRR. No JVD. Gastrointestinal system: Abdomen is nondistended, soft and nontender.  Extremities: No pedal edema. Skin: No rashes, lesions or ulcers Psychiatry: Judgement and insight appear normal. Mood & affect appropriate. Data Reviewed:    Labs: Basic Metabolic Panel: Recent Labs  Lab 10/26/21 0300 10/28/21 1552 10/31/21 0852 10/31/21 1009  NA 141 141 140 140  K 3.5 3.8 3.5 3.8  CL 116* 111 109 104  CO2 20* 25 25  --   GLUCOSE 111* 167* 145* 145*  BUN 13 15 26* 26*  CREATININE 0.60 0.68 0.95 1.00  CALCIUM 8.4* 8.8* 9.0  --   MG  --  2.0  --   --   PHOS 2.8  --   --   --     GFR Estimated Creatinine Clearance: 74.2 mL/min (by C-G formula based on SCr of 1 mg/dL). Liver Function Tests: Recent Labs  Lab 10/26/21 0300  ALBUMIN 3.2*    No results for input(s): "LIPASE", "AMYLASE" in the last 168 hours.  No results for input(s): "AMMONIA" in the last 168 hours.  Coagulation profile No results for input(s): "INR", "PROTIME" in the last 168 hours. COVID-19 Labs  No results for input(s): "DDIMER", "FERRITIN", "LDH", "CRP" in the last 72 hours.  Lab Results  Component Value Date   SARSCOV2NAA NEGATIVE 11/15/2020   [place] NEGATIVE 08/14/2020   [place] Not Detected 06/06/2018    CBC: Recent Labs  Lab 10/26/21 0300  10/31/21 1009  WBC 7.6  --   HGB 9.1* 10.5*  HCT 29.8* 31.0*  MCV 86.9  --   PLT 185  --     Cardiac Enzymes: No results for input(s): "CKTOTAL", "CKMB", "CKMBINDEX", "TROPONINI" in the last 168 hours. BNP (last 3 results) No results for input(s): "PROBNP" in the last 8760 hours. CBG: Recent Labs  Lab 10/31/21 0619 10/31/21 1218 10/31/21 1740 10/31/21 2225 11/01/21 0618  GLUCAP 158* 197* 158* 131* 120*    D-Dimer: No results for input(s): "DDIMER" in the last 72 hours. Hgb A1c: No results for input(s): "HGBA1C" in the last 72 hours. Lipid Profile: No results for input(s): "CHOL", "HDL", "LDLCALC", "TRIG", "CHOLHDL", "LDLDIRECT" in the last 72 hours. Thyroid function studies: No results for input(s): "TSH", "T4TOTAL", "T3FREE", "THYROIDAB" in the last 72 hours.  Invalid input(s): "FREET3" Anemia work up: No results for input(s): "VITAMINB12", "FOLATE", "FERRITIN", "TIBC", "IRON", "RETICCTPCT" in the last 72 hours. Sepsis  Labs: Recent Labs  Lab 10/26/21 0300  WBC 7.6    Microbiology No results found for this or any previous visit (from the past 240 hour(s)).   Medications:    azaTHIOprine  150 mg Oral q AM   Chlorhexidine Gluconate Cloth  6 each Topical Daily   DULoxetine  60 mg Oral Daily   heparin  5,000 Units Subcutaneous Q8H   hydrocortisone  25 mg Rectal BID   insulin aspart  0-9 Units Subcutaneous TID WC   levothyroxine  175 mcg Oral Daily   mometasone-formoterol  2 puff Inhalation BID   pantoprazole  40 mg Oral Daily   predniSONE  10 mg Oral Q breakfast   pyridostigmine  60 mg Oral QID   topiramate  200 mg Oral Q1500   traZODone  50 mg Oral QHS   Continuous Infusions:      LOS: 11 days   Charlynne Cousins  [facility]  11/01/2021, 9:05 AM

## 2021-11-02 ENCOUNTER — Ambulatory Visit: Payer: Medicare Other | Admitting: Family Medicine

## 2021-11-02 DIAGNOSIS — G7001 Myasthenia gravis with (acute) exacerbation: Secondary | ICD-10-CM | POA: Diagnosis not present

## 2021-11-02 LAB — POCT I-STAT, CHEM 8
BUN: 31 mg/dL — ABNORMAL HIGH (ref 6–20)
Calcium, Ion: 1.3 mmol/L (ref 1.15–1.40)
Chloride: 103 mmol/L (ref 98–111)
Creatinine, Ser: 1.3 mg/dL — ABNORMAL HIGH (ref 0.44–1.00)
Glucose, Bld: 145 mg/dL — ABNORMAL HIGH (ref 70–99)
HCT: 33 % — ABNORMAL LOW (ref 36.0–46.0)
Hemoglobin: 11.2 g/dL — ABNORMAL LOW (ref 12.0–15.0)
Potassium: 4 mmol/L (ref 3.5–5.1)
Sodium: 139 mmol/L (ref 135–145)
TCO2: 24 mmol/L (ref 22–32)

## 2021-11-02 LAB — BASIC METABOLIC PANEL
Anion gap: 8 (ref 5–15)
BUN: 30 mg/dL — ABNORMAL HIGH (ref 6–20)
CO2: 24 mmol/L (ref 22–32)
Calcium: 9 mg/dL (ref 8.9–10.3)
Chloride: 106 mmol/L (ref 98–111)
Creatinine, Ser: 1.14 mg/dL — ABNORMAL HIGH (ref 0.44–1.00)
GFR, Estimated: >60 mL/min (ref >60)
Glucose, Bld: 164 mg/dL — ABNORMAL HIGH (ref 70–99)
Potassium: 3.9 mmol/L (ref 3.5–5.1)
Sodium: 138 mmol/L (ref 135–145)

## 2021-11-02 LAB — GLUCOSE, CAPILLARY
Glucose-Capillary: 150 mg/dL — ABNORMAL HIGH (ref 70–99)
Glucose-Capillary: 208 mg/dL — ABNORMAL HIGH (ref 70–99)

## 2021-11-02 MED ORDER — ACETAMINOPHEN 325 MG PO TABS: 650.0000 mg | ORAL_TABLET | ORAL | Status: DC | PRN

## 2021-11-02 MED ORDER — HEPARIN SODIUM (PORCINE) 1000 UNIT/ML IJ SOLN
1000.0000 [IU] | Freq: Once | INTRAMUSCULAR | Status: AC
  Administered 2021-11-02: 1000 [IU]
  Filled 2021-11-02 (×2): qty 1

## 2021-11-02 MED ORDER — ACD FORMULA A 0.73-2.45-2.2 GM/100ML VI SOLN
1000.0000 mL | Status: DC
  Administered 2021-11-02: 1000 mL
  Filled 2021-11-02 (×3): qty 1000

## 2021-11-02 MED ORDER — DIPHENHYDRAMINE HCL 25 MG PO CAPS
25.0000 mg | ORAL_CAPSULE | Freq: Four times a day (QID) | ORAL | Status: DC | PRN
  Filled 2021-11-02: qty 1

## 2021-11-02 MED ORDER — CALCIUM CARBONATE ANTACID 500 MG PO CHEW
CHEWABLE_TABLET | ORAL | Status: AC
  Administered 2021-11-02: 400 mg
  Filled 2021-11-02: qty 2

## 2021-11-02 MED ORDER — CALCIUM GLUCONATE-NACL 2-0.675 GM/100ML-% IV SOLN
2.0000 g | Freq: Once | INTRAVENOUS | Status: AC
  Administered 2021-11-02: 2000 mg via INTRAVENOUS
  Filled 2021-11-02 (×2): qty 100

## 2021-11-02 MED ORDER — SODIUM CHLORIDE 0.9 % IV SOLN
INTRAVENOUS | Status: AC
  Filled 2021-11-02 (×3): qty 200

## 2021-11-02 NOTE — Progress Notes (Signed)
[facility] PROGRESS NOTE    Progress Note  Jade Mathis  XVQ:[id number] DOB: [birth year] DOA: 10/20/2021 PCP: Hoyt Koch, MD     Brief Narrative:   Jade Mathis is an 46 y.o. 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 on chronic steroids, Azathioprine and Mestinon presents to [place] with history of 3 weeks of gradual onset progressive weakness shortness of breath, double vision, difficulty masticating and chest pressure continues were consistent with myasthenia gravis flare, in the ER she was evaluated by neuro who recommended plasma exchange and admission  Consultant: Neurology Assessment/Plan:   Acute Myasthenia exacerbation ([place]): Received plasmapheresis by neurology initiated on 10/24/2021 every other day for 5 sessions, completed on 11/02/21 Avoid medications that can trigger myasthenia, quinolones and macrolides quinine and cautions with calcium channel blockers and beta-blockers and statins. Neurology on board  Narcotic/multidrug induced obtundation on 07/22/2021 Patient has insisted she takes Oxy 15 mg 4 times a day which she cannot tolerate due to depression of her respiratory status and hypotension.  Therefore oxycodone was stopped Review of her records has shown she uses chronic benzos Due to her episode of hypotension these were held. Thus far she has exhibited no withdrawal of narcotics or benzos. She needs to go home off benzodiazepine and off all types of narcotics.  Persistent hypotension Avoid hypertensive medications or medications that can drop her blood pressure She is currently asymptomatic. ACE inhibitor has been discontinued Blood pressure continues to be borderline low she will have to be discharged off her medications.  Anxiety disorder/bipolar  disorder Narcotics and benzo were held due to intermittent episodes of somnolence and hypotension she has experienced no withdrawal She has been off her medications since the fifth of this month  Right upper extremity edema Upper extremity Doppler was negative for DVT, now resolved  Diabetes mellitus type 2 SSI, Accu-Cheks, hypoglycemic protocol  Normocytic anemia Stable around baseline Daily CBC  Mild AKI Cr bumped to 1.3 Daily BMP  Asthma Stable  Hypothyroidism Continue Synthroid   DVT prophylaxis: lovenox Family Communication: Discussed with husband at bedside Status is: Inpatient Remains inpatient appropriate because: Acute myasthenia gravis exacerbation.    Code Status:     Code Status Orders  (From admission, onward)           Start     Ordered   10/21/21 0720  Full code  Continuous        10/21/21 0719           Code Status History     Date Active Date Inactive Code Status Order ID Comments User Context   08/26/2021 1729 08/30/2021 2107 Full Code [id number]  Nita Sells, MD ED   08/14/2020 0730 08/16/2020 2321 Full Code [id number]  Norval Morton, MD ED   09/23/2016 1507 09/25/2016 1829 Full Code [id number]  Waldemar Dickens, MD Inpatient   09/22/2016 1017 09/23/2016 1507 Full Code [id number]  Shirley, [place], [place] Inpatient   06/13/2016 2254 06/14/2016 2054 Full Code [id number]  Sherlyn Hay, DO Inpatient   11/04/2015 0029 11/04/2015 1232 Full Code [id number]  Tresea Mall, CNM Inpatient   09/10/2014 0011 09/13/2014 1352 Full Code [id number]  Toy Baker, MD Inpatient   10/11/2012 2032 10/12/2012 0321 Full Code [id number]  Delaine Lame ED         IV Access:   Peripheral  IV   Procedures and diagnostic studies:   HD catheter placement by IR   Medical Consultants:   Neurology   Subjective:    Jade Mathis feels very frustrated about not leaving today, HD catheter unable to be removed by IR today, will  do tomorrow and patient can then be discharged.   Objective:    Vitals:   11/02/21 1419 11/02/21 1429 11/02/21 1441 11/02/21 1515  BP: (!) 90/54 (!) 109/54 (!) 93/53 (!) 97/57  Pulse: 95 96 98 94  Resp: '18 18 18 18  '$ Temp: 98 F (36.7 C) 98 F (36.7 C) 98 F (36.7 C) 98 F (36.7 C)  TempSrc:      SpO2:      Weight:    78 kg  Height:       SpO2: 100 % O2 Flow Rate (L/min): 2 L/min   Intake/Output Summary (Last 24 hours) at 11/02/2021 1846 Last data filed at 11/02/2021 1700 Gross per 24 hour  Intake 1580 ml  Output --  Net 1580 ml   Filed Weights   10/31/21 0400 11/02/21 1345 11/02/21 1515  Weight: 78.4 kg 78 kg 78 kg    Exam: General: NAD  Cardiovascular: S1, S2 present Respiratory: CTAB Abdomen: Soft, nontender, nondistended, bowel sounds present Musculoskeletal: No bilateral pedal edema noted Skin: Normal Psychiatry: Normal mood   Data Reviewed:    Labs: Basic Metabolic Panel: Recent Labs  Lab 10/28/21 1552 10/31/21 0852 10/31/21 1009 11/02/21 1250 11/02/21 1343  NA 141 140 140 138 139  K 3.8 3.5 3.8 3.9 4.0  CL 111 109 104 106 103  CO2 25 25  --  24  --   GLUCOSE 167* 145* 145* 164* 145*  BUN 15 26* 26* 30* 31*  CREATININE 0.68 0.95 1.00 1.14* 1.30*  CALCIUM 8.8* 9.0  --  9.0  --   MG 2.0  --   --   --   --    GFR Estimated Creatinine Clearance: 57 mL/min (A) (by C-G formula based on SCr of 1.3 mg/dL (H)). Liver Function Tests: No results for input(s): "AST", "ALT", "ALKPHOS", "BILITOT", "PROT", "ALBUMIN" in the last 168 hours. No results for input(s): "LIPASE", "AMYLASE" in the last 168 hours.  No results for input(s): "AMMONIA" in the last 168 hours.  Coagulation profile No results for input(s): "INR", "PROTIME" in the last 168 hours. COVID-19 Labs  No results for input(s): "DDIMER", "FERRITIN", "LDH", "CRP" in the last 72 hours.  Lab Results  Component Value Date   SARSCOV2NAA NEGATIVE 11/15/2020   [place] NEGATIVE 08/14/2020    [place] Not Detected 06/06/2018    CBC: Recent Labs  Lab 10/31/21 1009 11/02/21 1343  HGB 10.5* 11.2*  HCT 31.0* 33.0*   Cardiac Enzymes: No results for input(s): "CKTOTAL", "CKMB", "CKMBINDEX", "TROPONINI" in the last 168 hours. BNP (last 3 results) No results for input(s): "PROBNP" in the last 8760 hours. CBG: Recent Labs  Lab 11/01/21 0618 11/01/21 1144 11/01/21 1612 11/01/21 2110 11/02/21 1140  GLUCAP 120* 160* 225* 156* 150*   D-Dimer: No results for input(s): "DDIMER" in the last 72 hours. Hgb A1c: No results for input(s): "HGBA1C" in the last 72 hours. Lipid Profile: No results for input(s): "CHOL", "HDL", "LDLCALC", "TRIG", "CHOLHDL", "LDLDIRECT" in the last 72 hours. Thyroid function studies: No results for input(s): "TSH", "T4TOTAL", "T3FREE", "THYROIDAB" in the last 72 hours.  Invalid input(s): "FREET3" Anemia work up: No results for input(s): "VITAMINB12", "FOLATE", "FERRITIN", "TIBC", "IRON", "RETICCTPCT" in the last 72  hours. Sepsis Labs: No results for input(s): "PROCALCITON", "WBC", "LATICACIDVEN" in the last 168 hours. Microbiology No results found for this or any previous visit (from the past 240 hour(s)).   Medications:    azaTHIOprine  150 mg Oral q AM   Chlorhexidine Gluconate Cloth  6 each Topical Daily   DULoxetine  60 mg Oral Daily   heparin  5,000 Units Subcutaneous Q8H   hydrocortisone  25 mg Rectal BID   insulin aspart  0-9 Units Subcutaneous TID WC   levothyroxine  175 mcg Oral Daily   mometasone-formoterol  2 puff Inhalation BID   pantoprazole  40 mg Oral Daily   predniSONE  10 mg Oral Q breakfast   pyridostigmine  60 mg Oral QID   topiramate  200 mg Oral Q1500   traZODone  50 mg Oral QHS   Continuous Infusions:  citrate dextrose        LOS: 12 days   Alma Friendly  [facility]  11/02/2021, 6:46 PM

## 2021-11-02 NOTE — Progress Notes (Signed)
Patient refused heparin injection stated "do not touch me". Patient educated on importance of medication. MD notified. Patient very agitated with staff d/t no d/c order in. This RN explained to patient HD cath has to be removed before d/c. Patient stated our staff is a mess.  Daymon Larsen, RN

## 2021-11-02 NOTE — Progress Notes (Signed)
Paged IR to notify of order to remove tunneled catheter. Awaiting response.  Daymon Larsen, RN

## 2021-11-02 NOTE — Progress Notes (Signed)
Patient upset that bed has not been changed with clean linens. Clean linens found in room. This RN offered to change linens. Patient used profanity and refused the change.   Daymon Larsen, RN

## 2021-11-02 NOTE — Progress Notes (Signed)
NIF: -20 VC: 882m   With decent patient effort. Patient was sleepy.

## 2021-11-02 NOTE — Progress Notes (Signed)
Subjective: Feels stronger and is ready to go home. Last plex today.  Objective: Current vital signs: BP (!) 91/57 (BP Location: Left Leg)   Pulse 85   Temp 97.6 F (36.4 C) (Oral)   Resp 18   Ht '5\' 6"'$  (1.676 m)   Wt 78.4 kg   LMP 10/13/2021   SpO2 100%   BMI 27.91 kg/m  Vital signs in last 24 hours: Temp:  [97.6 F (36.4 C)-98.4 F (36.9 C)] 97.6 F (36.4 C) (08/16 0815) Pulse Rate:  [72-85] 85 (08/16 0815) Resp:  [18-21] 18 (08/16 0815) BP: (91-101)/(52-57) 91/57 (08/16 0815) SpO2:  [98 %-100 %] 100 % (08/16 0850)  Intake/Output from previous day: No intake/output data recorded. Intake/Output this shift: Total I/O In: 240 [P.O.:240] Out: -  Nutritional status:  Diet Order             Diet Carb Modified Fluid consistency: Thin; Room service appropriate? Yes  Diet effective now                  HEENT: [place]/AT Lungs: Respirations unlabored Ext: No edema  Neurologic Exam: Ment: Awake and alert. Speech fluent with intact comprehension. Pleasant and cooperative.  CN: Fixates and tracks normally. EOMI. Transverse smile (aka "myasthenic snarl"). Phonation intact.  Motor: 5/5 BUE and 4+/5 in BL hip flexors and rest of the BL lower ext muscle groups are 5/5. No spasticity noted.  Sensory: Intact to touch x 4 Reflexes: Normoactive x 4 Cerebellar: No ataxia noted Gait: Deferred  Lab Results: Results for orders placed or performed during the hospital encounter of 10/20/21 (from the past 48 hour(s))  Glucose, capillary     Status: Abnormal   Collection Time: 10/31/21 12:18 PM  Result Value Ref Range   Glucose-Capillary 197 (H) 70 - 99 mg/dL    Comment: Glucose reference range applies only to samples taken after fasting for at least 8 hours.   Comment 1 Notify RN    Comment 2 Document in Chart   Glucose, capillary     Status: Abnormal   Collection Time: 10/31/21  5:40 PM  Result Value Ref Range   Glucose-Capillary 158 (H) 70 - 99 mg/dL    Comment: Glucose  reference range applies only to samples taken after fasting for at least 8 hours.   Comment 1 Notify RN    Comment 2 Document in Chart   Glucose, capillary     Status: Abnormal   Collection Time: 10/31/21 10:25 PM  Result Value Ref Range   Glucose-Capillary 131 (H) 70 - 99 mg/dL    Comment: Glucose reference range applies only to samples taken after fasting for at least 8 hours.  Glucose, capillary     Status: Abnormal   Collection Time: 11/01/21  6:18 AM  Result Value Ref Range   Glucose-Capillary 120 (H) 70 - 99 mg/dL    Comment: Glucose reference range applies only to samples taken after fasting for at least 8 hours.  Glucose, capillary     Status: Abnormal   Collection Time: 11/01/21 11:44 AM  Result Value Ref Range   Glucose-Capillary 160 (H) 70 - 99 mg/dL    Comment: Glucose reference range applies only to samples taken after fasting for at least 8 hours.  Glucose, capillary     Status: Abnormal   Collection Time: 11/01/21  4:12 PM  Result Value Ref Range   Glucose-Capillary 225 (H) 70 - 99 mg/dL    Comment: Glucose reference range applies only to samples  taken after fasting for at least 8 hours.  Glucose, capillary     Status: Abnormal   Collection Time: 11/01/21  9:10 PM  Result Value Ref Range   Glucose-Capillary 156 (H) 70 - 99 mg/dL    Comment: Glucose reference range applies only to samples taken after fasting for at least 8 hours.   Comment 1 Notify RN    Comment 2 Document in Chart    *Note: Due to a large number of results and/or encounters for the requested time period, some results have not been displayed. A complete set of results can be found in Results Review.    No results found for this or any previous visit (from the past 240 hour(s)).  Lipid Panel No results for input(s): "CHOL", "TRIG", "HDL", "CHOLHDL", "VLDL", "LDLCALC" in the last 72 hours.  Studies/Results: No results found.  Medications: Scheduled:  azaTHIOprine  150 mg Oral q AM    Chlorhexidine Gluconate Cloth  6 each Topical Daily   DULoxetine  60 mg Oral Daily   heparin  5,000 Units Subcutaneous Q8H   hydrocortisone  25 mg Rectal BID   insulin aspart  0-9 Units Subcutaneous TID WC   levothyroxine  175 mcg Oral Daily   mometasone-formoterol  2 puff Inhalation BID   pantoprazole  40 mg Oral Daily   predniSONE  10 mg Oral Q breakfast   pyridostigmine  60 mg Oral QID   topiramate  200 mg Oral Q1500   traZODone  50 mg Oral QHS   Continuous:   Assessment: 46 y.o. 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  She reports has been having trouble chewing with swallowing. She reports IVIG has worked well for her in the past. She has tried PLEX in the past when she was diagnosed about 30 years ago. We discussed potential risk of worsening of ACS with IVIG, especially since she was having significant chest tightness. She opted to go with PLEX. We also spoke with her and discussed that her Lisinopril needs to be held for PLEX. Also discused details along with risks and benefits of PLEX. Tunneled catheter placed 8/4. PLEX started on 8/7  - Symptomatically improved after 4 sessions of PLEX - Exam today: Fatigued with activity, Mild weakness x 4. Myasthenic snarl noted.    Recommendations: - Has had 4 PLEX sessions and is due for last session today. - can resume lisinopril in a couple days. - Every 6 hours NIFs and VC which have been stable so far. - Consider elective intubation for respiratory compromise if VC falls below 15 to 20 mL/kg and or NIFs falls below -20cm/H2O. If poor effort and not sure, can always get ABG to assess for CO2 retention. Also consider elective intubation for airway protection if patient develops difficulty managing  secretions - Continue home Mestinon PO '60mg'$  QID. If unable to swallow, can switch from PO to IV.('30mg'$  PO is equivalent to '1mg'$  IV). Continue Prednisone '10mg'$  daily and Azathioprine '150mg'$  daily. - Medications that may worsen or trigger MG exacerbation: Class IA antiarrhythmics, magnesium, flouroquinolones, macrolides, aminoglycosides, penicillamine, curare, interferon alpha, botox, quinine. Use with caution: calcium channel blocker, beta blockers and statins. - okay to discharge after her last session of PLEX today.   LOS: 12  days    [place] Pager Number [phone]

## 2021-11-02 NOTE — Progress Notes (Signed)
Patient refusing for all staff to enter her room until morning for the HD cath removal. MD notified.  Daymon Larsen, RN

## 2021-11-03 ENCOUNTER — Inpatient Hospital Stay (HOSPITAL_COMMUNITY): Payer: Medicare Other

## 2021-11-03 ENCOUNTER — Ambulatory Visit: Payer: Medicare Other | Admitting: Physical Therapy

## 2021-11-03 HISTORY — PX: IR REMOVAL TUN CV CATH W/O FL: IMG2289

## 2021-11-03 LAB — BASIC METABOLIC PANEL
Anion gap: 6 (ref 5–15)
BUN: 26 mg/dL — ABNORMAL HIGH (ref 6–20)
CO2: 22 mmol/L (ref 22–32)
Calcium: 8.9 mg/dL (ref 8.9–10.3)
Chloride: 111 mmol/L (ref 98–111)
Creatinine, Ser: 0.71 mg/dL (ref 0.44–1.00)
GFR, Estimated: >60 mL/min (ref >60)
Glucose, Bld: 153 mg/dL — ABNORMAL HIGH (ref 70–99)
Potassium: 4.6 mmol/L (ref 3.5–5.1)
Sodium: 139 mmol/L (ref 135–145)

## 2021-11-03 LAB — GLUCOSE, CAPILLARY: Glucose-Capillary: 135 mg/dL — ABNORMAL HIGH (ref 70–99)

## 2021-11-03 MED ORDER — SODIUM CHLORIDE 0.9 % IV SOLN
INTRAVENOUS | Status: DC
  Filled 2021-11-03 (×3): qty 200

## 2021-11-03 MED ORDER — CALCIUM GLUCONATE-NACL 2-0.675 GM/100ML-% IV SOLN: 2.0000 g | Freq: Once | INTRAVENOUS | Status: DC

## 2021-11-03 MED ORDER — LIDOCAINE HCL 1 % IJ SOLN
INTRAMUSCULAR | Status: AC
  Filled 2021-11-03: qty 20

## 2021-11-03 MED ORDER — CALCIUM CARBONATE ANTACID 500 MG PO CHEW: 2.0000 | CHEWABLE_TABLET | ORAL | Status: DC

## 2021-11-03 MED ORDER — ACETAMINOPHEN 325 MG PO TABS: 650.0000 mg | ORAL_TABLET | ORAL | Status: DC | PRN

## 2021-11-03 MED ORDER — HEPARIN SODIUM (PORCINE) 1000 UNIT/ML IJ SOLN: 1000.0000 [IU] | Freq: Once | INTRAMUSCULAR | Status: DC

## 2021-11-03 MED ORDER — ACD FORMULA A 0.73-2.45-2.2 GM/100ML VI SOLN: 1000.0000 mL | Status: DC

## 2021-11-03 MED ORDER — DIPHENHYDRAMINE HCL 25 MG PO CAPS: 25.0000 mg | ORAL_CAPSULE | Freq: Four times a day (QID) | ORAL | Status: DC | PRN

## 2021-11-03 NOTE — Care Management Important Message (Signed)
Important Message  Patient Details  Name: Cashlynn Yearwood MRN: [id number] Date of Birth: [birth year]   Medicare Important Message Given:  Yes     Laiyah, [place] 11/03/2021, 10:04 AM

## 2021-11-03 NOTE — Discharge Instructions (Signed)
Post tunneled central venous catheter removal care instruction  - No shower for 24 hours after removal, you make start taking shower at 10 am on 8/18.  - Keep the dressing on when you take shower tomorrow, take the dressing off immediately after taking shower and pat dry completely.  - May place a Band-aid until fully heals/closes  - Do not submerge right upper chest area for 7 days, no swimming or sitting in a tub - Contact primary care provider if there is concern for skin infection, such as severe redness, increased warmth, tender to touch, odorous discharge, fever, chills.

## 2021-11-03 NOTE — TOC Transition Note (Signed)
Transition of Care (TOC) - CM/SW Discharge Note  Marvetta Gibbons RN, BSN Transitions of Care Unit 4E- RN Case Manager See Treatment Team for direct phone #   Patient Details  Name: Jade Mathis MRN: [id number] Date of Birth: 1975-08-21  Transition of Care [facility]) CM/SW Contact:  Dawayne Patricia, RN Phone Number: 11/03/2021, 12:33 PM   Clinical Narrative:    Pt stable for transition home today after HD cath removed.  Pt from home w/ spouse, completed planned PLEX treatments x5  Per bedside RN pt reporting she will need assistance with taxi ride home.  CSW alerted for transportation needs.   No TOC other TOC needs noted for discharge.    Final next level of care: Home/Self Care Barriers to Discharge: No Barriers Identified, Transportation   Patient Goals and CMS Choice Patient states their goals for this hospitalization and ongoing recovery are:: return home   Choice offered to / list presented to : NA  Discharge Placement               Home        Discharge Plan and Services In-house Referral: Clinical Social Work Discharge Planning Services: NA Post Acute Care Choice: NA          DME Arranged: N/A DME Agency: NA       HH Arranged: NA HH Agency: NA        Social Determinants of Health (SDOH) Interventions     Readmission Risk Interventions     No data to display

## 2021-11-03 NOTE — Social Work (Signed)
CSW provided patient with taxi voucher- patient states she no income to pay for taxi.   Thurmond Butts, MSW, LCSW Clinical Social Worker

## 2021-11-03 NOTE — Progress Notes (Signed)
NIF= -28 average of three attempts Good effort

## 2021-11-03 NOTE — Discharge Summary (Signed)
Physician Discharge Summary   Patient: Jade Mathis MRN: [id number] DOB: 07-06-1975  Admit date:     10/20/2021  Discharge date: 11/03/21  Discharge Physician: Alma Friendly   PCP: Hoyt Koch, MD   Recommendations at discharge:   PCP 1 week Neurology as scheduled  Discharge Diagnoses: Principal Problem:   Myasthenia exacerbation [facility])    Hospital Course: Jade Mathis is an 46 y.o. 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 on chronic steroids, Azathioprine and Mestinon presents to [place] with history of 3 weeks of gradual onset progressive weakness shortness of breath, double vision, difficulty masticating and chest pressure continues were consistent with myasthenia gravis flare, in the ER she was evaluated by neuro who recommended plasma exchange and admission.   Today, patient denies any new complaints.  Insisting on getting a cab voucher for transportation home.  Advised to follow-up with PCP and neurology as scheduled  Assessment and Plan:  Acute Myasthenia exacerbation Received plasmapheresis by neurology initiated on 10/24/2021 every other day for 5 sessions, completed on 11/02/21 Avoid medications that can trigger myasthenia, quinolones and macrolides quinine and cautions with calcium channel blockers and beta-blockers and statins. Follow-up with neurology   Narcotic/multidrug induced obtundation on 07/22/2021 Patient has insisted she takes Oxy 15 mg 4 times a day which she cannot tolerate due to depression of her respiratory status and hypotension.  Therefore oxycodone was stopped Review of her records has shown she uses chronic benzos Due to her episode of hypotension these were held. Thus far she has exhibited no withdrawal of narcotics or benzos. Discharged home off  benzodiazepine and off all types of narcotics.   Persistent hypotension Avoid hypertensive medications or medications that can drop her blood pressure She is currently asymptomatic. ACE inhibitor has been discontinued Blood pressure continues to be borderline low, discharged off her medications PCP to restart pending BP checks/improvement   Anxiety disorder/bipolar disorder Narcotics and benzo were held due to intermittent episodes of somnolence and hypotension she has experienced no withdrawal She has been off her medications since the fifth of this month  Right upper extremity edema Upper extremity Doppler was negative for DVT, now resolved  Diabetes mellitus type 2 Resume home meds   Normocytic anemia Stable around baseline  Asthma Stable  Hypothyroidism Continue Synthroid     Consultants: Neurology Procedures performed: Plasma exchange Disposition: Home Diet recommendation:  Cardiac and Carb modified diet    DISCHARGE MEDICATION: Allergies as of 11/03/2021       Reactions   Magnesium-containing Compounds Other (See Comments)   Potential for MG exacerbation with magnesium-containing products (particularly IV). Supplement with caution and monitor closely    Depo-provera [medroxyprogesterone] Other (See Comments)   Headaches    Other Itching, Other (See Comments)   Patient requires 1-2 Hydroxyzine when pain meds are taken   Vicodin [hydrocodone-acetaminophen] Nausea Only        Medication List     STOP taking these medications    ALPRAZolam 1 MG tablet Commonly known as: XANAX   amLODipine 2.5 MG tablet Commonly known as: NORVASC   gabapentin 100 MG capsule Commonly known as: NEURONTIN   gabapentin 300 MG capsule Commonly known as: NEURONTIN   ibuprofen 800 MG tablet Commonly known as: ADVIL   lisinopril-hydrochlorothiazide 10-12.5 MG tablet Commonly known as: ZESTORETIC   oxyCODONE 15 MG immediate  release tablet Commonly known as:  ROXICODONE       TAKE these medications    Accu-Chek Aviva Plus w/Device Kit Use to test blood sugar daily   albuterol (2.5 MG/3ML) 0.083% nebulizer solution Commonly known as: PROVENTIL Take 3 mLs (2.5 mg total) by nebulization every 4 (four) hours as needed for wheezing or shortness of breath.   albuterol 108 (90 Base) MCG/ACT inhaler Commonly known as: VENTOLIN HFA Inhale 2 puffs into the lungs every 6 (six) hours as needed for wheezing or shortness of breath.   azaTHIOprine 50 MG tablet Commonly known as: IMURAN Take 3 tablets (150 mg total) by mouth in the morning.   b complex vitamins tablet Take 1 tablet by mouth daily.   celecoxib 100 MG capsule Commonly known as: CeleBREX Take 1 capsule (100 mg total) by mouth 2 (two) times daily.   cetirizine 10 MG tablet Commonly known as: ZYRTEC TAKE 1 TABLET BY MOUTH EVERY DAY   DULoxetine 60 MG capsule Commonly known as: CYMBALTA Take 60 mg by mouth daily.   ergocalciferol 1.25 MG (50000 UT) capsule Commonly known as: VITAMIN D2 Take 50,000 Units by mouth every Sunday.   Fluticasone-Salmeterol 100-50 MCG/DOSE Aepb Commonly known as: ADVAIR Inhale 1 puff into the lungs 2 (two) times daily.   hydrOXYzine 10 MG tablet Commonly known as: ATARAX Take 10 mg by mouth 3 (three) times daily as needed for itching.   Insulin Pen Needle 31G X 5 MM Misc Use daily with insulin pen   Jardiance 25 MG Tabs tablet Generic drug: empagliflozin Take 12.5 mg by mouth daily.   levothyroxine 175 MCG tablet Commonly known as: SYNTHROID Take 175 mcg by mouth daily.   linaclotide 290 MCG Caps capsule Commonly known as: LINZESS Take 290 mcg by mouth daily before breakfast.   metFORMIN 500 MG 24 hr tablet Commonly known as: GLUCOPHAGE-XR Take 1,000 mg by mouth at bedtime.   ondansetron 4 MG disintegrating tablet Commonly known as: ZOFRAN-ODT Take 1 tablet (4 mg total) by mouth every 8 (eight) hours as needed for nausea or  vomiting.   OneTouch Delica Lancets 35T Misc Use twice a day   OZEMPIC (1 MG/DOSE) [place] Inject 0.05 mg into the skin every Sunday.   pantoprazole 40 MG tablet Commonly known as: PROTONIX TAKE 1 TABLET BY MOUTH EVERY DAY What changed: when to take this   predniSONE 10 MG tablet Commonly known as: DELTASONE Take 1 tablet (10 mg total) by mouth daily. What changed: Another medication with the same name was removed. Continue taking this medication, and follow the directions you see here.   promethazine 12.5 MG tablet Commonly known as: PHENERGAN Take 1 tablet (12.5 mg total) by mouth every 8 (eight) hours as needed for nausea or vomiting.   pyridostigmine 60 MG tablet Commonly known as: MESTINON Take 1 tablet (60 mg total) by mouth 4 (four) times daily.   QUEtiapine 100 MG tablet Commonly known as: SEROQUEL Take 1 tablet (100 mg total) by mouth at bedtime. What changed: Another medication with the same name was removed. Continue taking this medication, and follow the directions you see here.   Qulipta 60 MG Tabs Generic drug: Atogepant Take 60 mg by mouth daily.   topiramate 200 MG tablet Commonly known as: TOPAMAX Take 200 mg by mouth daily in the afternoon.   traZODone 100 MG tablet Commonly known as: DESYREL Take 100 mg by mouth at bedtime.   valACYclovir 500 MG tablet Commonly known as: VALTREX Take  1 tablet (500 mg total) by mouth 2 (two) times daily. What changed:  when to take this reasons to take this        Follow-up Information     Hoyt Koch, MD. Schedule an appointment as soon as possible for a visit in 1 week(s).   Specialty: Internal Medicine Contact information: [place] [address] [phone]                Discharge Exam: Danley Danker Weights   11/02/21 1345 11/02/21 1515 11/03/21 0500  Weight: 78 kg 78 kg 78.2 kg   General: NAD  Cardiovascular: S1, S2 present Respiratory: CTAB Abdomen: Soft, nontender,  nondistended, bowel sounds present Musculoskeletal: No bilateral pedal edema noted Skin: Normal Psychiatry: Normal mood   Condition at discharge: stable  The results of significant diagnostics from this hospitalization (including imaging, microbiology, ancillary and laboratory) are listed below for reference.   Imaging Studies: IR Removal Tun Cv Cath W/O FL  Result Date: 11/03/2021 INDICATION: Patient with myasthenia gravis exacerbation status post tunneled plasmapheresis catheter placement on 10/21/2021. Patient completed plasmapheresis. Request for tunneled plasmapheresis catheter removal. EXAM: REMOVAL OF TUNNELED PLASMAPHERESIS CATHETER MEDICATIONS: None COMPLICATIONS: None immediate. PROCEDURE: Informed written consent was obtained from the patient following an explanation of the procedure, risks, benefits and alternatives to treatment. A time out was performed prior to the initiation of the procedure. Sterile technique was utilized including mask, sterile gloves, sterile drape, and hand hygiene. ChloraPrep was used to prep the patient's right neck, chest and existing catheter. The catheter was removed intact by applying manual retraction. Hemostasis was obtained with manual compression. A dressing was placed. The patient tolerated the procedure well without immediate post procedural complication. IMPRESSION: Successful removal of tunneled plasmapheresis catheter. Read by: Durenda Guthrie, PA-C Electronically Signed   By: Miachel Roux M.D.   On: 11/03/2021 10:24   VAS [place] UPPER EXTREMITY VENOUS DUPLEX  Result Date: 10/23/2021 UPPER VENOUS STUDY  Patient Name:  SHRITHA BRESEE  Date of Exam:   10/23/2021 Medical Rec #: [id number]            Accession #:    [id number] Date of Birth: [birth year]            Patient Gender: F Patient Age:   41 years Exam Location:  [facility] Procedure:      VAS [place] UPPER EXTREMITY VENOUS DUPLEX Referring Phys: JEFFREY MCCLUNG  --------------------------------------------------------------------------------  Indications: Edema Risk Factors: Previous IV placement and recent right side port placement. Limitations: Poor ultrasound/tissue interface. Comparison Study: No previous exams Performing Technologist: Jody Hill RVT, RDMS  Examination Guidelines: A complete evaluation includes B-mode imaging, spectral Doppler, color Doppler, and power Doppler as needed of all accessible portions of each vessel. Bilateral testing is considered an integral part of a complete examination. Limited examinations for reoccurring indications may be performed as noted.  Right Findings: +----------+------------+---------+-----------+----------+-------+ RIGHT     CompressiblePhasicitySpontaneousPropertiesSummary +----------+------------+---------+-----------+----------+-------+ IJV           Full       Yes       Yes                      +----------+------------+---------+-----------+----------+-------+ Subclavian    Full       Yes       Yes                      +----------+------------+---------+-----------+----------+-------+ Axillary  Full       Yes       Yes                      +----------+------------+---------+-----------+----------+-------+ Brachial      Full       Yes       Yes                      +----------+------------+---------+-----------+----------+-------+ Radial        Full                                          +----------+------------+---------+-----------+----------+-------+ Ulnar         Full                                          +----------+------------+---------+-----------+----------+-------+ Cephalic      Full                                          +----------+------------+---------+-----------+----------+-------+ Subcutaneous edema to upper arm and area of ac fossa.  Left Findings: +----------+------------+---------+-----------+----------+---------------------+ LEFT       CompressiblePhasicitySpontaneousProperties       Summary        +----------+------------+---------+-----------+----------+---------------------+ Subclavian               Yes       Yes               patent by color and                                                             doppler        +----------+------------+---------+-----------+----------+---------------------+  Summary:  Right: No evidence of deep vein thrombosis in the upper extremity. No evidence of superficial vein thrombosis in the upper extremity. Subcutaneous edema noted to upper arm and area of the ac fossa.  Left: No evidence of thrombosis in the subclavian.  *See table(s) above for measurements and observations.  Diagnosing physician: Deitra Mayo MD Electronically signed by Deitra Mayo MD on 10/23/2021 at 5:20:36 PM.    Final    VAS [place] LOWER EXTREMITY VENOUS (DVT)  Result Date: 10/21/2021  Lower Venous DVT Study Patient Name:  SAE HANDRICH  Date of Exam:   10/21/2021 Medical Rec #: [id number]            Accession #:    [id number] Date of Birth: January 14, 1976            Patient Gender: F Patient Age:   18 years Exam Location:  [facility] Procedure:      VAS [place] LOWER EXTREMITY VENOUS (DVT) Referring Phys: [name] --------------------------------------------------------------------------------  Indications: Edema.  Comparison Study: No previous exam noted. Performing Technologist: Bobetta Lime BS, RVT  Examination Guidelines: A complete evaluation includes B-mode imaging, spectral Doppler, color Doppler, and power Doppler as needed of all accessible portions of each vessel. Bilateral testing is considered an integral part  of a complete examination. Limited examinations for reoccurring indications may be performed as noted. The reflux portion of the exam is performed with the patient in reverse Trendelenburg.  +---------+---------------+---------+-----------+----------+--------------+ RIGHT     CompressibilityPhasicitySpontaneityPropertiesThrombus Aging +---------+---------------+---------+-----------+----------+--------------+ CFV      Full           Yes      Yes                                 +---------+---------------+---------+-----------+----------+--------------+ SFJ      Full                                                        +---------+---------------+---------+-----------+----------+--------------+ FV Prox  Full                                                        +---------+---------------+---------+-----------+----------+--------------+ FV Mid   Full                                                        +---------+---------------+---------+-----------+----------+--------------+ FV DistalFull                                                        +---------+---------------+---------+-----------+----------+--------------+ PFV      Full                                                        +---------+---------------+---------+-----------+----------+--------------+ POP      Full           Yes      Yes                                 +---------+---------------+---------+-----------+----------+--------------+ PTV      Full                                                        +---------+---------------+---------+-----------+----------+--------------+ PERO     Full                                                        +---------+---------------+---------+-----------+----------+--------------+   +---------+---------------+---------+-----------+----------+--------------+ LEFT     CompressibilityPhasicitySpontaneityPropertiesThrombus Aging +---------+---------------+---------+-----------+----------+--------------+ CFV      Full  Yes      Yes                                 +---------+---------------+---------+-----------+----------+--------------+ SFJ      Full                                                         +---------+---------------+---------+-----------+----------+--------------+ FV Prox  Full                                                        +---------+---------------+---------+-----------+----------+--------------+ FV Mid   Full                                                        +---------+---------------+---------+-----------+----------+--------------+ FV DistalFull                                                        +---------+---------------+---------+-----------+----------+--------------+ PFV      Full                                                        +---------+---------------+---------+-----------+----------+--------------+ POP      Full           Yes      Yes                                 +---------+---------------+---------+-----------+----------+--------------+ PTV      Full                                                        +---------+---------------+---------+-----------+----------+--------------+ PERO     Full                                                        +---------+---------------+---------+-----------+----------+--------------+     Summary: BILATERAL: - No evidence of deep vein thrombosis seen in the lower extremities, bilaterally. -No evidence of popliteal cyst, bilaterally.   *See table(s) above for measurements and observations. Electronically signed by Deitra Mayo MD on 10/21/2021 at 7:27:40 PM.    Final    CT Head Wo Contrast  Result Date: 10/21/2021 CLINICAL DATA:  Head trauma after fall EXAM: CT HEAD WITHOUT CONTRAST  TECHNIQUE: Contiguous axial images were obtained from the base of the skull through the vertex without intravenous contrast. RADIATION DOSE REDUCTION: This exam was performed according to the departmental dose-optimization program which includes automated exposure control, adjustment of the mA and/or kV according to patient size and/or use of iterative reconstruction  technique. COMPARISON:  MRI head 09/06/2021 FINDINGS: Brain: No intracranial hemorrhage, mass effect, or evidence of acute infarct. No hydrocephalus. No extra-axial fluid collection. Redemonstrated abnormal appearance of the anterior right frontal lobe with a prominent sulcus extending inferiorly towards the frontal sinus where there is an osseous defect. Motion artifact in this area limits evaluation though is grossly similar to CT 05/20/2021. Vascular: No hyperdense vessel or unexpected calcification. Skull: No fracture or focal lesion. Sinuses/Orbits: No acute finding. Mild mucosal thickening in the paranasal sinuses. The mastoid air cells are well aerated. Other: None. IMPRESSION: 1. No acute intracranial abnormality or calvarial fracture. 2. 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 Electronically Signed   By: Placido Sou M.D.   On: 10/21/2021 18:39   DG Hip Unilat With Pelvis 2-3 Views Right  Result Date: 10/21/2021 CLINICAL DATA:  Fall, pain EXAM: DG HIP (WITH OR WITHOUT PELVIS) 2-3V RIGHT COMPARISON:  Left hip today FINDINGS: There is no evidence of hip fracture or dislocation. There is no evidence of arthropathy or other focal bone abnormality. IMPRESSION: Negative. Electronically Signed   By: Rolm Baptise M.D.   On: 10/21/2021 18:04   DG Hip Unilat With Pelvis 2-3 Views Left  Result Date: 10/21/2021 CLINICAL DATA:  Fall, pain EXAM: DG HIP (WITH OR WITHOUT PELVIS) 2-3V LEFT COMPARISON:  None Available. FINDINGS: There is no evidence of hip fracture or dislocation. There is no evidence of arthropathy or other focal bone abnormality. IMPRESSION: Negative. Electronically Signed   By: Rolm Baptise M.D.   On: 10/21/2021 18:04   IR Fluoro Guide CV Line Right  Result Date: 10/21/2021 INDICATION: MYASTHENIA GRAVIS EXACERBATION, ACCESS FOR PLASMAPHERESIS EXAM: ULTRASOUND  GUIDANCE FOR VASCULAR ACCESS RIGHT INTERNAL JUGULAR PERMANENT HEMODIALYSIS CATHETER Date:  10/21/2021 10/21/2021 1:07 pm Radiologist:  Jerilynn Mages. Daryll Brod, MD Guidance:  Ultrasound and fluoroscopic FLUOROSCOPY: Fluoroscopy Time: 0 minutes 36 seconds (14 mGy). MEDICATIONS: Ancef 2 g within 1 hour of the procedure ANESTHESIA/SEDATION: Versed 1.0 mg IV; Fentanyl 25 mcg IV; Moderate Sedation Time:  15 minute The patient was continuously monitored during the procedure by the interventional radiology nurse under my direct supervision. CONTRAST:  none. COMPLICATIONS: None immediate. PROCEDURE: Informed consent was obtained from the patient following explanation of the procedure, risks, benefits and alternatives. The patient understands, agrees and consents for the procedure. All questions were addressed. A time out was performed. Maximal barrier sterile technique utilized including caps, mask, sterile gowns, sterile gloves, large sterile drape, hand hygiene, and 2% chlorhexidine scrub. Under sterile conditions and local anesthesia, right internal jugular micropuncture venous access was performed with ultrasound. Images were obtained for documentation of the patent right internal jugular vein. A guide wire was inserted followed by a transitional dilator. Next, a 0.035 guidewire was advanced into the IVC with a 5-French catheter. Measurements were obtained from the right venotomy site to the proximal right atrium. In the right infraclavicular chest, a subcutaneous tunnel was created under sterile conditions and local anesthesia. 1% lidocaine with epinephrine was utilized for this. The 19 cm tip to cuff palindrome catheter was tunneled subcutaneously  to the venotomy site and inserted into the SVC/RA junction through a valved peel-away sheath. Position was confirmed with fluoroscopy. Images were obtained for documentation. Blood was aspirated from the catheter followed by saline and heparin flushes. The appropriate volume and strength  of heparin was instilled in each lumen. Caps were applied. The catheter was secured at the tunnel site with Gelfoam and a pursestring suture. The venotomy site was closed with subcuticular Vicryl suture. Dermabond was applied to the small right neck incision. A dry sterile dressing was applied. The catheter is ready for use. No immediate complications. IMPRESSION: Ultrasound and fluoroscopically guided right internal jugular tunneled hemodialysis catheter (19 cm tip to cuff palindrome catheter). Electronically Signed   By: Jerilynn Mages.  Shick M.D.   On: 10/21/2021 13:22   IR US Guide Vasc Access Right  Result Date: 10/21/2021 INDICATION: MYASTHENIA GRAVIS EXACERBATION, ACCESS FOR PLASMAPHERESIS EXAM: ULTRASOUND GUIDANCE FOR VASCULAR ACCESS RIGHT INTERNAL JUGULAR PERMANENT HEMODIALYSIS CATHETER Date:  10/21/2021 10/21/2021 1:07 pm Radiologist:  Jerilynn Mages. Daryll Brod, MD Guidance:  Ultrasound and fluoroscopic FLUOROSCOPY: Fluoroscopy Time: 0 minutes 36 seconds (14 mGy). MEDICATIONS: Ancef 2 g within 1 hour of the procedure ANESTHESIA/SEDATION: Versed 1.0 mg IV; Fentanyl 25 mcg IV; Moderate Sedation Time:  15 minute The patient was continuously monitored during the procedure by the interventional radiology nurse under my direct supervision. CONTRAST:  none. COMPLICATIONS: None immediate. PROCEDURE: Informed consent was obtained from the patient following explanation of the procedure, risks, benefits and alternatives. The patient understands, agrees and consents for the procedure. All questions were addressed. A time out was performed. Maximal barrier sterile technique utilized including caps, mask, sterile gowns, sterile gloves, large sterile drape, hand hygiene, and 2% chlorhexidine scrub. Under sterile conditions and local anesthesia, right internal jugular micropuncture venous access was performed with ultrasound. Images were obtained for documentation of the patent right internal jugular vein. A guide wire was inserted followed by  a transitional dilator. Next, a 0.035 guidewire was advanced into the IVC with a 5-French catheter. Measurements were obtained from the right venotomy site to the proximal right atrium. In the right infraclavicular chest, a subcutaneous tunnel was created under sterile conditions and local anesthesia. 1% lidocaine with epinephrine was utilized for this. The 19 cm tip to cuff palindrome catheter was tunneled subcutaneously to the venotomy site and inserted into the SVC/RA junction through a valved peel-away sheath. Position was confirmed with fluoroscopy. Images were obtained for documentation. Blood was aspirated from the catheter followed by saline and heparin flushes. The appropriate volume and strength of heparin was instilled in each lumen. Caps were applied. The catheter was secured at the tunnel site with Gelfoam and a pursestring suture. The venotomy site was closed with subcuticular Vicryl suture. Dermabond was applied to the small right neck incision. A dry sterile dressing was applied. The catheter is ready for use. No immediate complications. IMPRESSION: Ultrasound and fluoroscopically guided right internal jugular tunneled hemodialysis catheter (19 cm tip to cuff palindrome catheter). Electronically Signed   By: Jerilynn Mages.  Shick M.D.   On: 10/21/2021 13:22   CT Angio Chest Pulmonary Embolism (PE) W or WO Contrast  Result Date: 10/21/2021 CLINICAL DATA:  Elevated D-dimer, shortness of breath, question pulmonary embolism EXAM: CT ANGIOGRAPHY CHEST WITH CONTRAST TECHNIQUE: Multidetector CT imaging of the chest was performed using the standard protocol during bolus administration of intravenous contrast. Multiplanar CT image reconstructions and MIPs were obtained to evaluate the vascular anatomy. RADIATION DOSE REDUCTION: This exam was performed according to the departmental  dose-optimization program which includes automated exposure control, adjustment of the mA and/or kV according to patient size and/or use of  iterative reconstruction technique. CONTRAST:  71m OMNIPAQUE IOHEXOL 350 MG/ML SOLN IV COMPARISON:  11/15/2020 FINDINGS: Cardiovascular: Atherosclerotic calcifications aorta without aneurysm or dissection. Heart size normal. No pericardial effusion. Pulmonary arteries adequately opacified and patent. No evidence of pulmonary embolism. Mediastinum/Nodes: Esophagus unremarkable. Base of cervical region normal appearance. No thoracic adenopathy. Prior superior mediastinal surgery with scattered surgical clips. Lungs/Pleura: Dependent atelectasis BILATERAL lower lobes. Remaining lungs clear. No infiltrate, pleural effusion, or pneumothorax Upper Abdomen: Prior gastric bypass surgery. Remaining visualized upper abdomen unremarkable. Musculoskeletal: No acute osseous findings. Sternal wires from prior thymectomy. Review of the MIP images confirms the above findings. IMPRESSION: No evidence of pulmonary embolism. No acute intrathoracic abnormalities. Aortic Atherosclerosis (ICD10-I70.0). Electronically Signed   By: MLavonia DanaM.D.   On: 10/21/2021 09:49   DG Chest Portable 1 View  Result Date: 10/20/2021 CLINICAL DATA:  Chest pressure and shortness of breath. EXAM: PORTABLE CHEST 1 VIEW COMPARISON:  May 20, 2021 FINDINGS: Multiple sternal wires are noted. The heart size and mediastinal contours are within normal limits. Both lungs are clear. The visualized skeletal structures are unremarkable. IMPRESSION: No active cardiopulmonary disease. Electronically Signed   By: TVirgina NorfolkM.D.   On: 10/20/2021 17:47   XR Ankle Complete Left  Result Date: 10/12/2021 Radiographs of the left ankle without acute abnormality.  Mortise is congruent.  XR Lumbar Spine 2-3 Views  Result Date: 10/12/2021 Radiographs of the lumbar spine show well-preserved disc spaces.  No spondylolisthesis.   Microbiology: Results for orders placed or performed during the hospital encounter of 11/15/20  Resp Panel by RT-PCR (Flu A&B,  Covid) Nasopharyngeal Swab     Status: None   Collection Time: 11/15/20 12:46 PM   Specimen: Nasopharyngeal Swab; Nasopharyngeal(NP) swabs in vial transport medium  Result Value Ref Range Status   SARS Coronavirus 2 by RT PCR NEGATIVE NEGATIVE Final    Comment: (NOTE) SARS-CoV-2 target nucleic acids are NOT DETECTED.  The SARS-CoV-2 RNA is generally detectable in upper respiratory specimens during the acute phase of infection. The lowest concentration of SARS-CoV-2 viral copies this assay can detect is 138 copies/mL. A negative result does not preclude SARS-Cov-2 infection and should not be used as the sole basis for treatment or other patient management decisions. A negative result may occur with  improper specimen collection/handling, submission of specimen other than nasopharyngeal swab, presence of viral mutation(s) within the areas targeted by this assay, and inadequate number of viral copies(<138 copies/mL). A negative result must be combined with clinical observations, patient history, and epidemiological information. The expected result is Negative.  Fact Sheet for Patients:  hEntrepreneurPulse.com.au Fact Sheet for Healthcare Providers:  hIncredibleEmployment.be This test is no t yet approved or cleared by the [place] and  has been authorized for detection and/or diagnosis of SARS-CoV-2 by FDA under an Emergency Use Authorization (EUA). This EUA will remain  in effect (meaning this test can be used) for the duration of the COVID-19 declaration under Section 564(b)(1) of the Act, 21 U.S.C.section 360bbb-3(b)(1), unless the authorization is terminated  or revoked sooner.       Influenza A by PCR NEGATIVE NEGATIVE Final   Influenza B by PCR NEGATIVE NEGATIVE Final    Comment: (NOTE) The Xpert Xpress SARS-CoV-2/FLU/RSV plus assay is intended as an aid in the diagnosis of influenza from Nasopharyngeal swab specimens and should  not be  used as a sole basis for treatment. Nasal washings and aspirates are unacceptable for Xpert Xpress SARS-CoV-2/FLU/RSV testing.  Fact Sheet for Patients: EntrepreneurPulse.com.au  Fact Sheet for Healthcare Providers: IncredibleEmployment.be  This test is not yet approved or cleared by the [place] FDA and has been authorized for detection and/or diagnosis of SARS-CoV-2 by FDA under an Emergency Use Authorization (EUA). This EUA will remain in effect (meaning this test can be used) for the duration of the COVID-19 declaration under Section 564(b)(1) of the Act, 21 U.S.C. section 360bbb-3(b)(1), unless the authorization is terminated or revoked.  Performed at [facility], [place] [address], [place], [place] [address]    *Note: Due to a large number of results and/or encounters for the requested time period, some results have not been displayed. A complete set of results can be found in Results Review.    Labs: CBC: Recent Labs  Lab 10/31/21 1009 11/02/21 1343  HGB 10.5* 11.2*  HCT 31.0* 11.0*   Basic Metabolic Panel: Recent Labs  Lab 10/28/21 1552 10/31/21 0852 10/31/21 1009 11/02/21 1250 11/02/21 1343 11/03/21 0700  NA 141 140 140 138 139 139  K 3.8 3.5 3.8 3.9 4.0 4.6  CL 111 109 104 106 103 111  CO2 25 25  --  24  --  22  GLUCOSE 167* 145* 145* 164* 145* 153*  BUN 15 26* 26* 30* 31* 26*  CREATININE 0.68 0.95 1.00 1.14* 1.30* 0.71  CALCIUM 8.8* 9.0  --  9.0  --  8.9  MG 2.0  --   --   --   --   --    Liver Function Tests: No results for input(s): "AST", "ALT", "ALKPHOS", "BILITOT", "PROT", "ALBUMIN" in the last 168 hours. CBG: Recent Labs  Lab 11/01/21 1612 11/01/21 2110 11/02/21 1140 11/02/21 2122 11/03/21 0636  GLUCAP 225* 156* 150* 208* 135*    Discharge time spent: greater than 30 minutes.  Signed: Alma Friendly, MD [facility] 11/03/2021

## 2021-11-03 NOTE — Procedures (Signed)
PROCEDURE SUMMARY:  Successful removal of tunneled hemodialysis catheter.  Patient tolerated well.  EBL < 1 mL  See full dictation in Imaging for details.  Armando Gang Eulalia Ellerman PA-C 11/03/2021 9:51 AM

## 2021-11-03 NOTE — Progress Notes (Signed)
Pt politely requested we do not disturb her between 11pm-5am for vitals, labs, etc. 8pm vital stable and she has no complaints or needs at this time.

## 2021-11-04 ENCOUNTER — Other Ambulatory Visit: Payer: Self-pay

## 2021-11-04 ENCOUNTER — Encounter: Payer: Self-pay | Admitting: *Deleted

## 2021-11-04 ENCOUNTER — Emergency Department (HOSPITAL_BASED_OUTPATIENT_CLINIC_OR_DEPARTMENT_OTHER): Payer: Medicare Other

## 2021-11-04 ENCOUNTER — Telehealth: Payer: Self-pay | Admitting: *Deleted

## 2021-11-04 ENCOUNTER — Emergency Department (HOSPITAL_BASED_OUTPATIENT_CLINIC_OR_DEPARTMENT_OTHER)
Admission: EM | Admit: 2021-11-04 | Discharge: 2021-11-04 | Disposition: A | Discharge status: Home / Self Care | Payer: Medicare Other | Attending: Emergency Medicine | Admitting: Emergency Medicine

## 2021-11-04 ENCOUNTER — Ambulatory Visit: Payer: Medicare Other | Admitting: Gastroenterology

## 2021-11-04 ENCOUNTER — Encounter (HOSPITAL_BASED_OUTPATIENT_CLINIC_OR_DEPARTMENT_OTHER): Payer: Self-pay | Admitting: Pharmacy Technician

## 2021-11-04 ENCOUNTER — Emergency Department (HOSPITAL_BASED_OUTPATIENT_CLINIC_OR_DEPARTMENT_OTHER): Payer: Medicare Other | Admitting: Radiology

## 2021-11-04 DIAGNOSIS — Z79899 Other long term (current) drug therapy: Secondary | ICD-10-CM | POA: Diagnosis not present

## 2021-11-04 DIAGNOSIS — R079 Chest pain, unspecified: Secondary | ICD-10-CM | POA: Diagnosis not present

## 2021-11-04 DIAGNOSIS — E119 Type 2 diabetes mellitus without complications: Secondary | ICD-10-CM | POA: Diagnosis not present

## 2021-11-04 DIAGNOSIS — M542 Cervicalgia: Secondary | ICD-10-CM | POA: Diagnosis not present

## 2021-11-04 DIAGNOSIS — Y92410 Unspecified street and highway as the place of occurrence of the external cause: Secondary | ICD-10-CM | POA: Diagnosis not present

## 2021-11-04 DIAGNOSIS — Z79891 Long term (current) use of opiate analgesic: Secondary | ICD-10-CM | POA: Diagnosis not present

## 2021-11-04 DIAGNOSIS — M545 Low back pain, unspecified: Secondary | ICD-10-CM | POA: Diagnosis not present

## 2021-11-04 DIAGNOSIS — Z041 Encounter for examination and observation following transport accident: Secondary | ICD-10-CM | POA: Diagnosis not present

## 2021-11-04 DIAGNOSIS — Z794 Long term (current) use of insulin: Secondary | ICD-10-CM | POA: Diagnosis not present

## 2021-11-04 DIAGNOSIS — Z7984 Long term (current) use of oral hypoglycemic drugs: Secondary | ICD-10-CM | POA: Diagnosis not present

## 2021-11-04 DIAGNOSIS — S0990XA Unspecified injury of head, initial encounter: Secondary | ICD-10-CM | POA: Diagnosis not present

## 2021-11-04 DIAGNOSIS — G8929 Other chronic pain: Secondary | ICD-10-CM | POA: Diagnosis not present

## 2021-11-04 MED ORDER — IBUPROFEN 400 MG PO TABS: 400.0000 mg | ORAL_TABLET | Freq: Four times a day (QID) | ORAL | 0 refills | Status: DC | PRN

## 2021-11-04 MED ORDER — IBUPROFEN 400 MG PO TABS
600.0000 mg | ORAL_TABLET | Freq: Once | ORAL | Status: AC
  Administered 2021-11-04: 600 mg via ORAL
  Filled 2021-11-04: qty 1

## 2021-11-04 MED ORDER — ACETAMINOPHEN 325 MG PO TABS: 650.0000 mg | ORAL_TABLET | Freq: Four times a day (QID) | ORAL | 0 refills | Status: AC | PRN
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
## 2021-11-04 NOTE — Patient Outreach (Signed)
  Care Coordination [facility] Note Transition Care Management Follow-up Telephone Call Date of discharge and from where: 11/03/21- [place] How have you been since you were released from the hospital? "I am feeling rough; I am weak feeling, but I am doing okay, I guess; I have my family here helping me" Any questions or concerns? Yes- "I think I need a walker and I think I need physical therapy coming in to work with me, but they did not order that at the hospital;" advised patient to discuss needs with PCP and explained that these requests require an order from her PCP since they were not ordered during her hospital visit; will facilitate scheduling of post-hospital discharge PCP office visit; also encouraged patient to consider scheduling herself, after our call, if she is able  Items Reviewed: Did the pt receive and understand the discharge instructions provided? Yes  Medications obtained and verified? Yes  Other?  N/A Any new allergies since your discharge? No  "not that I know of" Dietary orders reviewed? Yes Do you have support at home? Yes   Home Care and Equipment/Supplies: Were home health services ordered? no If so, what is the name of the agency? N/A  Has the agency set up a time to come to the patient's home? not applicable Were any new equipment or medical supplies ordered?  No What is the name of the medical supply agency? N/A Were you able to get the supplies/equipment? not applicable Do you have any questions related to the use of the equipment or supplies? No N/A  Functional Questionnaire: (I = Independent and D = Dependent) ADLs: I reports family assisting as indicated  Bathing/Dressing- I  reports family assisting as indicated  Meal Prep- D reports family managing meal prep and medications  Eating- I  Maintaining continence- I reports family assisting as indicated  Transferring/Ambulation- I reports family assisting as indicated  Managing Meds- D reports family  managing meal prep and medications  Follow up appointments reviewed:  PCP Hospital f/u appt confirmed? No  Scheduled to see - on - @ [facility] f/u appt confirmed?  N/A  Scheduled to see - on - @ - Are transportation arrangements needed? No  If their condition worsens, is the pt aware to call PCP or go to the Emergency Dept.? Yes Was the patient provided with contact information for the PCP's office or ED? Yes Was to pt encouraged to call back with questions or concerns? Yes  SDOH assessments and interventions completed:   Yes  Care Coordination Interventions Activated:  Yes   Care Coordination Interventions:  PCP follow up appointment requested    Encounter Outcome:  Pt. Visit Completed    Oneta Rack, RN, BSN, [place] RN [facility] [phone]: direct office

## 2021-11-04 NOTE — Chronic Care Management (AMB) (Signed)
  Care Coordination   Note   11/04/2021 Name: Keiasia Christianson MRN: [id number] DOB: Dec 07, 1975  Carlis Abbott Favor is a 46 y.o. year old female who sees Hoyt Koch, MD for primary care. I reached out to Lyndel Pleasure by phone today to offer care coordination services.  Ms. Grega was given information about Care Coordination services today including:   The Care Coordination services include support from the care team which includes your Nurse Coordinator, Clinical Social Worker, or Pharmacist.  The Care Coordination team is here to help remove barriers to the health concerns and goals most important to you. Care Coordination services are voluntary, and the patient may decline or stop services at any time by request to their care team member.   Care Coordination Consent Status: Patient agreed to services and verbal consent obtained.   Follow up plan:  Telephone appointment with care coordination team member scheduled for:  11/07/2021  Encounter Outcome:  Pt. Scheduled from referral   Julian Hy, [place] Direct Dial: [phone]
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
## 2021-11-07 ENCOUNTER — Telehealth: Payer: Self-pay | Admitting: Family

## 2021-11-07 ENCOUNTER — Ambulatory Visit: Payer: Self-pay

## 2021-11-07 ENCOUNTER — Ambulatory Visit: Payer: Medicare Other | Admitting: Physical Therapy

## 2021-11-07 ENCOUNTER — Other Ambulatory Visit: Payer: Self-pay | Admitting: Internal Medicine

## 2021-11-07 NOTE — Telephone Encounter (Signed)
Pt called requesting home health physical therapy for her left ankle. Pt states she has been in the hospital and unable to do outpatient rehab. Please call pt about this matter at [phone].

## 2021-11-07 NOTE — Patient Instructions (Signed)
Visit Information  Thank you for allowing me to share the care management and care coordination services that are available to you as part of your health plan and services through your primary care provider and medical home. Please reach out to your primary care provider or me at [phone] if the care management/care coordination team may be of assistance to you in the future.   Horacio Werth, RN, MSN, BSN, CCM Care Management Coordinator [phone]    

## 2021-11-07 NOTE — Patient Outreach (Signed)
  Care Coordination   Initial Visit Note   11/07/2021 Name: Jade Mathis MRN: [id number] DOB: 1975/12/22  Jade Mathis is a 46 y.o. year old female who sees Hoyt Koch, MD for primary care. I spoke with  Jade Mathis by phone today  What matters to the patients health and wellness today?  Patient declines services    Goals Addressed   None     SDOH assessments and interventions completed:  No     Care Coordination Interventions Activated:  No  Care Coordination Interventions:  No, not indicated   Follow up plan: No further intervention required.   Encounter Outcome:  Pt. Refused   Thea Silversmith, RN, MSN, BSN, CCM Care Coordinator [phone]

## 2021-11-08 ENCOUNTER — Other Ambulatory Visit: Payer: Self-pay | Admitting: Orthopedic Surgery

## 2021-11-08 ENCOUNTER — Telehealth: Payer: Self-pay | Admitting: Family

## 2021-11-08 ENCOUNTER — Ambulatory Visit: Payer: Medicare Other | Admitting: Physical Therapy

## 2021-11-08 DIAGNOSIS — G8929 Other chronic pain: Secondary | ICD-10-CM

## 2021-11-08 NOTE — Telephone Encounter (Signed)
Duplicate message. 

## 2021-11-08 NOTE — Telephone Encounter (Signed)
Pt informed

## 2021-11-08 NOTE — Telephone Encounter (Signed)
Referral faxed to [facility].

## 2021-11-08 NOTE — Telephone Encounter (Signed)
Pt called about last note of therapy in home. Please call pt at [phone]

## 2021-11-09 ENCOUNTER — Telehealth: Payer: Self-pay | Admitting: Orthopedic Surgery

## 2021-11-09 ENCOUNTER — Other Ambulatory Visit: Payer: Self-pay | Admitting: Family

## 2021-11-09 DIAGNOSIS — G8929 Other chronic pain: Secondary | ICD-10-CM

## 2021-11-09 NOTE — Telephone Encounter (Signed)
Already sent new referral to [facility]. Awaiting response

## 2021-11-09 NOTE — Telephone Encounter (Signed)
Patient called. She says [facility] can not come out to do her PT at home. Would like a RX sent to another place. Her call back number is [phone]

## 2021-11-10 ENCOUNTER — Ambulatory Visit (INDEPENDENT_AMBULATORY_CARE_PROVIDER_SITE_OTHER): Payer: Medicare Other | Admitting: Internal Medicine

## 2021-11-10 ENCOUNTER — Encounter: Payer: Self-pay | Admitting: Internal Medicine

## 2021-11-10 ENCOUNTER — Ambulatory Visit: Payer: Medicare Other | Admitting: Physical Therapy

## 2021-11-10 ENCOUNTER — Telehealth: Payer: Self-pay

## 2021-11-10 VITALS — BP 128/80 | HR 82 | Temp 98.3°F | Ht 66.0 in | Wt 165.0 lb

## 2021-11-10 DIAGNOSIS — M549 Dorsalgia, unspecified: Secondary | ICD-10-CM | POA: Diagnosis not present

## 2021-11-10 DIAGNOSIS — G8929 Other chronic pain: Secondary | ICD-10-CM

## 2021-11-10 DIAGNOSIS — G7000 Myasthenia gravis without (acute) exacerbation: Secondary | ICD-10-CM

## 2021-11-10 DIAGNOSIS — M79604 Pain in right leg: Secondary | ICD-10-CM | POA: Diagnosis not present

## 2021-11-10 DIAGNOSIS — M79605 Pain in left leg: Secondary | ICD-10-CM

## 2021-11-10 DIAGNOSIS — R0781 Pleurodynia: Secondary | ICD-10-CM

## 2021-11-10 MED ORDER — IBUPROFEN 400 MG PO TABS: 400.0000 mg | ORAL_TABLET | Freq: Four times a day (QID) | ORAL | 3 refills | Status: DC | PRN

## 2021-11-10 MED ORDER — ONDANSETRON HCL 4 MG PO TABS: 4.0000 mg | ORAL_TABLET | Freq: Three times a day (TID) | ORAL | 3 refills | Status: AC | PRN
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
## 2021-11-10 NOTE — Patient Instructions (Addendum)
We will get them coming to the house.  We have sent in ibuprofen and zofran.
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
## 2021-11-11 ENCOUNTER — Ambulatory Visit (INDEPENDENT_AMBULATORY_CARE_PROVIDER_SITE_OTHER): Payer: Medicare Other | Admitting: Family Medicine

## 2021-11-11 ENCOUNTER — Ambulatory Visit (INDEPENDENT_AMBULATORY_CARE_PROVIDER_SITE_OTHER): Payer: Medicare Other

## 2021-11-11 ENCOUNTER — Telehealth: Payer: Self-pay

## 2021-11-11 VITALS — BP 112/62 | Ht 66.0 in

## 2021-11-11 DIAGNOSIS — G7000 Myasthenia gravis without (acute) exacerbation: Secondary | ICD-10-CM | POA: Diagnosis not present

## 2021-11-11 DIAGNOSIS — R0781 Pleurodynia: Secondary | ICD-10-CM | POA: Insufficient documentation

## 2021-11-11 DIAGNOSIS — G43909 Migraine, unspecified, not intractable, without status migrainosus: Secondary | ICD-10-CM

## 2021-11-11 DIAGNOSIS — Z79899 Other long term (current) drug therapy: Secondary | ICD-10-CM | POA: Diagnosis not present
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
## 2021-11-11 NOTE — Assessment & Plan Note (Signed)
She feels unsure if treatment in the hospital helped with this or not but then states it did. She is still having a lot of stress which triggers her myasthenia but she is not in crisis today.
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
## 2021-11-11 NOTE — Patient Instructions (Signed)
Arnica lotion for the bruising [facility] [phone] Dr. Sharlet Salina is working on home health See me in 3-4 months

## 2021-11-11 NOTE — Telephone Encounter (Signed)
Pt is requesting that a referral be sent in to Dr. Berdine Addison at [facility]
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
## 2021-11-11 NOTE — Telephone Encounter (Signed)
For what condition? She has multiple neurological conditions.

## 2021-11-14 ENCOUNTER — Ambulatory Visit (INDEPENDENT_AMBULATORY_CARE_PROVIDER_SITE_OTHER): Payer: Medicare Other | Admitting: Orthopedic Surgery

## 2021-11-14 ENCOUNTER — Encounter: Payer: Self-pay | Admitting: Family Medicine

## 2021-11-14 ENCOUNTER — Ambulatory Visit: Payer: Medicare Other | Admitting: Physical Therapy

## 2021-11-14 ENCOUNTER — Telehealth: Payer: Self-pay | Admitting: Orthopedic Surgery

## 2021-11-14 ENCOUNTER — Encounter: Payer: Self-pay | Admitting: Orthopedic Surgery

## 2021-11-14 ENCOUNTER — Telehealth: Payer: Self-pay | Admitting: Family Medicine

## 2021-11-14 DIAGNOSIS — M25572 Pain in left ankle and joints of left foot: Secondary | ICD-10-CM

## 2021-11-14 DIAGNOSIS — G8929 Other chronic pain: Secondary | ICD-10-CM | POA: Diagnosis not present

## 2021-11-14 NOTE — Telephone Encounter (Signed)
Patient called asking if Dr Tamala Julian could write her a letter? She said that this was discussed at her visit on Friday.  Please advise.

## 2021-11-14 NOTE — Telephone Encounter (Signed)
Patient called. Says [facility] did not receive the RX for home health.

## 2021-11-14 NOTE — Telephone Encounter (Signed)
Confirmed with Sonia Side at [facility]. Checking to see if pt has enough visit for PT through her insurance since she has been doing out patient PT.

## 2021-11-14 NOTE — Telephone Encounter (Signed)
Yes they did, we have been communicating with [facility] about her referral. See previous message.
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
## 2021-11-14 NOTE — Telephone Encounter (Signed)
Still pending through [facility]. They are having to check on insurance status approval of amount of visits. It looks like she may have reached this limit.

## 2021-11-15 NOTE — Telephone Encounter (Signed)
The referral is for the chronic daily headaches and Acute exacerbation of myasthenia gravis.

## 2021-11-15 NOTE — Telephone Encounter (Signed)
Pt states that [facility] says she would need a new referral. She had already tried to switch providers.

## 2021-11-15 NOTE — Telephone Encounter (Signed)
LVM for pt to call the office with which dx she is needing her referral for.

## 2021-11-15 NOTE — Telephone Encounter (Signed)
She already sees [facility] and would need to call their office to request change of provider there. Does not need new referral typically.

## 2021-11-15 NOTE — Telephone Encounter (Signed)
Pt calling multiple times daily for status of letter. Once letter completed, please notify pt. She did respond to Dr. Thompson Caul questions regarding the letters content.

## 2021-11-16 ENCOUNTER — Encounter: Payer: Self-pay | Admitting: Family Medicine

## 2021-11-16 ENCOUNTER — Encounter: Payer: Self-pay | Admitting: Orthopedic Surgery

## 2021-11-16 ENCOUNTER — Telehealth: Payer: Self-pay | Admitting: Orthopedic Surgery

## 2021-11-16 NOTE — Telephone Encounter (Signed)
Pt has been notified.

## 2021-11-16 NOTE — Telephone Encounter (Signed)
I advised pt that the referral had be completed

## 2021-11-16 NOTE — Telephone Encounter (Signed)
Pt would like a call concerning some issues shes having

## 2021-11-16 NOTE — Telephone Encounter (Signed)
[facility] called back again, stating he has called pt 6 times with no answer or call back. He is canceling the referral with them since no contact initiated on pt's part. He does suggest that she proceed with out patient PT.

## 2021-11-16 NOTE — Telephone Encounter (Signed)
Jade Mathis at [facility] called stating that he has been trying to get a hold of pt, not answering and her phone is not taking VM. I called pt, she answered call, stating she is on phone with insurance and asked if he could call her back around 11. I let him know this and he will try again.

## 2021-11-16 NOTE — Addendum Note (Signed)
Addended by: Hoyt Koch on: 11/16/2021 08:33 AM   Modules accepted: Orders

## 2021-11-16 NOTE — Telephone Encounter (Signed)
Referral placed.

## 2021-11-17 ENCOUNTER — Telehealth: Payer: Self-pay | Admitting: Orthopedic Surgery

## 2021-11-17 ENCOUNTER — Ambulatory Visit: Payer: Medicare Other | Admitting: Physical Therapy

## 2021-11-17 DIAGNOSIS — H93293 Other abnormal auditory perceptions, bilateral: Secondary | ICD-10-CM | POA: Diagnosis not present

## 2021-11-17 NOTE — Telephone Encounter (Signed)
Pt called requesting a response from her message she sent thru mychart. Please call pt at [phone].

## 2021-11-17 NOTE — Telephone Encounter (Signed)
My chart message sent to pt.

## 2021-11-18 DIAGNOSIS — K589 Irritable bowel syndrome without diarrhea: Secondary | ICD-10-CM | POA: Diagnosis not present

## 2021-11-18 DIAGNOSIS — E119 Type 2 diabetes mellitus without complications: Secondary | ICD-10-CM | POA: Diagnosis not present

## 2021-11-18 DIAGNOSIS — K219 Gastro-esophageal reflux disease without esophagitis: Secondary | ICD-10-CM | POA: Diagnosis not present

## 2021-11-23 ENCOUNTER — Telehealth: Payer: Self-pay | Admitting: Orthopedic Surgery

## 2021-11-23 NOTE — Telephone Encounter (Signed)
Pt called requesting a call back from [place] or [facility] about an referral. She states her other dr did not send a referral and need to clear this confusion and get this worked out. Please call pt at [phone].

## 2021-11-24 NOTE — Telephone Encounter (Signed)
Patient called following up to see if Dr Jade Mathis was able to get in touch with someone about her being seen sooner?  Please advise.

## 2021-11-25 NOTE — Telephone Encounter (Signed)
Will try another [facility] referral.

## 2021-11-28 DIAGNOSIS — E559 Vitamin D deficiency, unspecified: Secondary | ICD-10-CM | POA: Diagnosis not present

## 2021-11-28 DIAGNOSIS — E1140 Type 2 diabetes mellitus with diabetic neuropathy, unspecified: Secondary | ICD-10-CM | POA: Diagnosis not present

## 2021-11-28 DIAGNOSIS — E039 Hypothyroidism, unspecified: Secondary | ICD-10-CM | POA: Diagnosis not present

## 2021-11-29 DIAGNOSIS — M545 Low back pain, unspecified: Secondary | ICD-10-CM | POA: Diagnosis not present

## 2021-11-29 DIAGNOSIS — Z79899 Other long term (current) drug therapy: Secondary | ICD-10-CM | POA: Diagnosis not present

## 2021-11-29 DIAGNOSIS — G8929 Other chronic pain: Secondary | ICD-10-CM | POA: Diagnosis not present

## 2021-11-29 DIAGNOSIS — Z79891 Long term (current) use of opiate analgesic: Secondary | ICD-10-CM | POA: Diagnosis not present

## 2021-11-29 NOTE — Telephone Encounter (Signed)
Referral sent to [facility] home health this morning, mychart message sent to patient.

## 2021-12-01 ENCOUNTER — Telehealth: Payer: Medicare Other | Admitting: Internal Medicine

## 2021-12-05 DIAGNOSIS — E109 Type 1 diabetes mellitus without complications: Secondary | ICD-10-CM | POA: Diagnosis not present

## 2021-12-05 LAB — HM DIABETES EYE EXAM

## 2021-12-06 ENCOUNTER — Encounter: Payer: Self-pay | Admitting: Internal Medicine

## 2021-12-06 ENCOUNTER — Telehealth (INDEPENDENT_AMBULATORY_CARE_PROVIDER_SITE_OTHER): Payer: Medicare Other | Admitting: Internal Medicine

## 2021-12-06 DIAGNOSIS — R159 Full incontinence of feces: Secondary | ICD-10-CM | POA: Diagnosis not present

## 2021-12-06 DIAGNOSIS — N3941 Urge incontinence: Secondary | ICD-10-CM | POA: Diagnosis not present

## 2021-12-06 MED ORDER — LUBIPROSTONE 24 MCG PO CAPS: 24.0000 ug | ORAL_CAPSULE | Freq: Two times a day (BID) | ORAL | 1 refills | Status: DC

## 2021-12-06 NOTE — Assessment & Plan Note (Signed)
Suspect at this time she is having overflow diarrhea. She is on chronic opioids and has chronic constipation. More lately she is having liquid stools which are difficult to control. I have recommended she do more for constipation. She originally denied taking anything for this. Then admitted to taking linzess daily and not feeling it was effective. We will stop and rx for amitiza 24 mcg BID done today. She may need multiple agents. She was off opioids in hospital and weaned however she did resume when leaving the hospital. I have asked her to discuss with her pain management provider to cut back on dosing or interval with medications to help her bowels and other side effects which could be associated.

## 2021-12-06 NOTE — Progress Notes (Signed)
Virtual Visit via Video Note  I connected with Jade Mathis on 12/06/21 at  8:20 AM EDT by a video enabled telemedicine application and verified that I am speaking with the correct person using two identifiers.  The patient and the provider were at separate locations throughout the entire encounter. Patient location: home, Provider location: work   I discussed the limitations of evaluation and management by telemedicine and the availability of in person appointments. The patient expressed understanding and agreed to proceed. The patient and the provider were the only parties present for the visit unless noted in HPI below.  History of Present Illness: The patient is a 46 y.o. female with visit for worsening liquid diarrhea and incontinence as well as no one has contacted her about PT services since our referral 11/10/21.   Observations/Objective: Appearance: stable, breathing appears normal, mental status is A and O times 3  Assessment and Plan: See problem oriented charting  Follow Up Instructions: Will check on form for incontinence supplies, stop linzess and start amitiza as she is likely having overflow diarrhea  I discussed the assessment and treatment plan with the patient. The patient was provided an opportunity to ask questions and all were answered. The patient agreed with the plan and demonstrated an understanding of the instructions.   The patient was advised to call back or seek an in-person evaluation if the symptoms worsen or if the condition fails to improve as anticipated.  Hoyt Koch, MD

## 2021-12-06 NOTE — Assessment & Plan Note (Signed)
Has chronic issues. Has run out of incontinence supplies. I do not have paperwork for renewal and have asked her to check with the home health provider as medicaid has specific forms which must be filled out yearly for this.

## 2021-12-13 ENCOUNTER — Ambulatory Visit: Payer: Medicare Other | Admitting: Audiologist

## 2021-12-14 ENCOUNTER — Telehealth: Payer: Self-pay | Admitting: Internal Medicine

## 2021-12-14 ENCOUNTER — Ambulatory Visit: Payer: Commercial Managed Care - HMO | Admitting: Neurology

## 2021-12-14 NOTE — Telephone Encounter (Signed)
LVM for pt to rtn my call to schedule AWV with [facility] call back # [phone] 

## 2021-12-15 ENCOUNTER — Ambulatory Visit: Payer: Medicare Other | Admitting: Orthopedic Surgery

## 2021-12-21 DIAGNOSIS — K219 Gastro-esophageal reflux disease without esophagitis: Secondary | ICD-10-CM | POA: Diagnosis not present

## 2021-12-21 DIAGNOSIS — E119 Type 2 diabetes mellitus without complications: Secondary | ICD-10-CM | POA: Diagnosis not present

## 2021-12-21 DIAGNOSIS — K589 Irritable bowel syndrome without diarrhea: Secondary | ICD-10-CM | POA: Diagnosis not present
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
## 2021-12-23 ENCOUNTER — Encounter: Payer: Self-pay | Admitting: Neurology

## 2021-12-23 ENCOUNTER — Ambulatory Visit (INDEPENDENT_AMBULATORY_CARE_PROVIDER_SITE_OTHER): Payer: Medicare Other | Admitting: Neurology

## 2021-12-23 ENCOUNTER — Other Ambulatory Visit (INDEPENDENT_AMBULATORY_CARE_PROVIDER_SITE_OTHER): Payer: Medicare Other

## 2021-12-23 VITALS — BP 98/62 | HR 56 | Ht 66.0 in | Wt 165.0 lb

## 2021-12-23 DIAGNOSIS — R471 Dysarthria and anarthria: Secondary | ICD-10-CM

## 2021-12-23 DIAGNOSIS — R531 Weakness: Secondary | ICD-10-CM

## 2021-12-23 DIAGNOSIS — G7000 Myasthenia gravis without (acute) exacerbation: Secondary | ICD-10-CM | POA: Diagnosis not present

## 2021-12-23 DIAGNOSIS — R1310 Dysphagia, unspecified: Secondary | ICD-10-CM | POA: Diagnosis not present

## 2021-12-23 DIAGNOSIS — H02403 Unspecified ptosis of bilateral eyelids: Secondary | ICD-10-CM | POA: Diagnosis not present

## 2021-12-23 DIAGNOSIS — G7001 Myasthenia gravis with (acute) exacerbation: Secondary | ICD-10-CM

## 2021-12-23 DIAGNOSIS — Z5181 Encounter for therapeutic drug level monitoring: Secondary | ICD-10-CM

## 2021-12-23 LAB — CBC
HCT: 37.5 % (ref 36.0–46.0)
Hemoglobin: 12 g/dL (ref 12.0–15.0)
MCHC: 32 g/dL (ref 30.0–36.0)
MCV: 84.2 fl (ref 78.0–100.0)
Platelets: 284 10*3/uL (ref 150.0–400.0)
RBC: 4.45 Mil/uL (ref 3.87–5.11)
RDW: 14.4 % (ref 11.5–15.5)
WBC: 4.9 10*3/uL (ref 4.0–10.5)

## 2021-12-23 NOTE — Patient Instructions (Addendum)
I saw you for myasthenia gravis today.  I would like to get lab work today to get more information before I consider medication changes.  Continue your medications that you are currently on: -Prednisone 10 mg daily -Azathioprine 150 mg daily -Mestinon 60 mg 4 times daily  I want to see you back in 2 weeks to reevaluate and discuss next steps.  I will call sooner if there is a problem with your labs.  Please call if you have new or worsening symptoms.  Go to the nearest ED if you have difficulty breathing or swallowing.  The physicians and staff at [facility] are committed to providing excellent care. You may receive a survey requesting feedback about your experience at our office. We strive to receive "very good" responses to the survey questions. If you feel that your experience would prevent you from giving the office a "very good " response, please contact our office to try to remedy the situation. We may be reached at [phone]. Thank you for taking the time out of your busy day to complete the survey.  Kai Levins, MD [facility]

## 2021-12-28 ENCOUNTER — Other Ambulatory Visit: Payer: Self-pay | Admitting: Internal Medicine

## 2021-12-29 ENCOUNTER — Ambulatory Visit (INDEPENDENT_AMBULATORY_CARE_PROVIDER_SITE_OTHER): Payer: Medicare Other | Admitting: Orthopedic Surgery

## 2021-12-29 DIAGNOSIS — G8929 Other chronic pain: Secondary | ICD-10-CM | POA: Diagnosis not present

## 2021-12-29 DIAGNOSIS — E559 Vitamin D deficiency, unspecified: Secondary | ICD-10-CM | POA: Diagnosis not present

## 2021-12-29 DIAGNOSIS — Z79899 Other long term (current) drug therapy: Secondary | ICD-10-CM | POA: Diagnosis not present

## 2021-12-29 DIAGNOSIS — E611 Iron deficiency: Secondary | ICD-10-CM | POA: Diagnosis not present

## 2021-12-29 DIAGNOSIS — E039 Hypothyroidism, unspecified: Secondary | ICD-10-CM | POA: Diagnosis not present

## 2021-12-29 DIAGNOSIS — M25572 Pain in left ankle and joints of left foot: Secondary | ICD-10-CM

## 2021-12-29 DIAGNOSIS — E119 Type 2 diabetes mellitus without complications: Secondary | ICD-10-CM | POA: Diagnosis not present

## 2021-12-29 DIAGNOSIS — E785 Hyperlipidemia, unspecified: Secondary | ICD-10-CM | POA: Diagnosis not present

## 2021-12-29 DIAGNOSIS — Z79891 Long term (current) use of opiate analgesic: Secondary | ICD-10-CM | POA: Diagnosis not present

## 2021-12-30 ENCOUNTER — Encounter: Payer: Self-pay | Admitting: Orthopedic Surgery

## 2021-12-30 DIAGNOSIS — M545 Low back pain, unspecified: Secondary | ICD-10-CM | POA: Diagnosis not present

## 2021-12-30 DIAGNOSIS — Z79899 Other long term (current) drug therapy: Secondary | ICD-10-CM | POA: Diagnosis not present

## 2021-12-30 DIAGNOSIS — Z79891 Long term (current) use of opiate analgesic: Secondary | ICD-10-CM | POA: Diagnosis not present

## 2021-12-30 DIAGNOSIS — G8929 Other chronic pain: Secondary | ICD-10-CM | POA: Diagnosis not present

## 2021-12-30 LAB — ACETYLCHOLINE RECEPTOR, BLOCKING: ACHR Blocking Abs: <15 % Inhibition (ref <15)

## 2021-12-30 LAB — ACETYLCHOLINE RECEPTOR, BINDING: A CHR BINDING ABS: <0.3 nmol/L

## 2021-12-30 LAB — ACETYLCHOLINE RECEPTOR, MODULATING: Acetylchol Modul Ab: 16 % Inhibition

## 2021-12-30 LAB — D-DIMER, QUANTITATIVE: D-Dimer, Quant: 0.7 mcg/mL FEU — ABNORMAL HIGH (ref <0.50)
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
## 2022-01-05 ENCOUNTER — Ambulatory Visit: Payer: Medicare Other | Admitting: Neurology

## 2022-01-05 ENCOUNTER — Encounter: Payer: Self-pay | Admitting: Neurology

## 2022-01-05 DIAGNOSIS — Z029 Encounter for administrative examinations, unspecified: Secondary | ICD-10-CM

## 2022-01-09 ENCOUNTER — Encounter: Payer: Self-pay | Admitting: Neurology

## 2022-01-09 DIAGNOSIS — E559 Vitamin D deficiency, unspecified: Secondary | ICD-10-CM | POA: Diagnosis not present

## 2022-01-09 DIAGNOSIS — E039 Hypothyroidism, unspecified: Secondary | ICD-10-CM | POA: Diagnosis not present

## 2022-01-09 DIAGNOSIS — E1140 Type 2 diabetes mellitus with diabetic neuropathy, unspecified: Secondary | ICD-10-CM | POA: Diagnosis not present

## 2022-01-10 DIAGNOSIS — Z23 Encounter for immunization: Secondary | ICD-10-CM | POA: Diagnosis not present

## 2022-01-10 DIAGNOSIS — E039 Hypothyroidism, unspecified: Secondary | ICD-10-CM | POA: Diagnosis not present

## 2022-01-10 DIAGNOSIS — E1140 Type 2 diabetes mellitus with diabetic neuropathy, unspecified: Secondary | ICD-10-CM | POA: Diagnosis not present
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
## 2022-01-18 DIAGNOSIS — K219 Gastro-esophageal reflux disease without esophagitis: Secondary | ICD-10-CM | POA: Diagnosis not present

## 2022-01-18 DIAGNOSIS — E119 Type 2 diabetes mellitus without complications: Secondary | ICD-10-CM | POA: Diagnosis not present

## 2022-01-18 DIAGNOSIS — K589 Irritable bowel syndrome without diarrhea: Secondary | ICD-10-CM | POA: Diagnosis not present

## 2022-01-19 ENCOUNTER — Ambulatory Visit: Payer: Medicare Other | Admitting: Neurology
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
## 2022-01-20 ENCOUNTER — Ambulatory Visit: Payer: Medicare Other | Admitting: Neurology

## 2022-01-24 ENCOUNTER — Ambulatory Visit: Payer: Medicare Other | Admitting: Sports Medicine
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
## 2022-01-25 ENCOUNTER — Ambulatory Visit (INDEPENDENT_AMBULATORY_CARE_PROVIDER_SITE_OTHER): Payer: Medicare Other | Admitting: Neurology

## 2022-01-25 ENCOUNTER — Encounter: Payer: Self-pay | Admitting: Neurology

## 2022-01-25 ENCOUNTER — Other Ambulatory Visit (INDEPENDENT_AMBULATORY_CARE_PROVIDER_SITE_OTHER): Payer: Medicare Other

## 2022-01-25 VITALS — BP 95/67 | HR 89 | Ht 66.0 in | Wt 184.0 lb

## 2022-01-25 DIAGNOSIS — Z5181 Encounter for therapeutic drug level monitoring: Secondary | ICD-10-CM

## 2022-01-25 DIAGNOSIS — R471 Dysarthria and anarthria: Secondary | ICD-10-CM | POA: Diagnosis not present

## 2022-01-25 DIAGNOSIS — R1310 Dysphagia, unspecified: Secondary | ICD-10-CM

## 2022-01-25 DIAGNOSIS — G7000 Myasthenia gravis without (acute) exacerbation: Secondary | ICD-10-CM | POA: Diagnosis not present

## 2022-01-25 DIAGNOSIS — S060X9S Concussion with loss of consciousness of unspecified duration, sequela: Secondary | ICD-10-CM

## 2022-01-25 DIAGNOSIS — R531 Weakness: Secondary | ICD-10-CM | POA: Diagnosis not present

## 2022-01-25 DIAGNOSIS — H02403 Unspecified ptosis of bilateral eyelids: Secondary | ICD-10-CM

## 2022-01-25 DIAGNOSIS — E039 Hypothyroidism, unspecified: Secondary | ICD-10-CM | POA: Diagnosis not present

## 2022-01-25 LAB — TSH: TSH: 2.34 u[IU]/mL (ref 0.35–5.50)

## 2022-01-25 LAB — T4, FREE: Free T4: 0.77 ng/dL (ref 0.60–1.60)

## 2022-01-25 MED ORDER — AZATHIOPRINE 50 MG PO TABS: 150.0000 mg | ORAL_TABLET | Freq: Every morning | ORAL | 3 refills | Status: DC

## 2022-01-25 MED ORDER — PREDNISONE 2.5 MG PO TABS: 7.5000 mg | ORAL_TABLET | Freq: Every day | ORAL | 3 refills | Status: DC

## 2022-01-25 MED ORDER — PYRIDOSTIGMINE BROMIDE 60 MG PO TABS: 60.0000 mg | ORAL_TABLET | Freq: Four times a day (QID) | ORAL | 11 refills | Status: DC

## 2022-01-25 NOTE — Patient Instructions (Signed)
I do not see evidence of active myasthenia gravis. Your antibody levels were negative.  I would like to check your thyroid levels as this can cause reduced energy.  I would like to try to get you physical therapy. I will try to get this at home.  We are adjusting your medications today. We want to decrease you prednisone to 7.5 mg daily. If you have worsening symptoms, increase back to 10 mg daily.  Continue aziothioprine 150 mg daily and mestinon 60 mg 4 times daily.  I sent all of these to your pharmacy ([facility]).  I would like to see you back in clinic in 3 months or sooner if you need me. Call with new or worsening symptoms.  The physicians and staff at [facility] are committed to providing excellent care. You may receive a survey requesting feedback about your experience at our office. We strive to receive "very good" responses to the survey questions. If you feel that your experience would prevent you from giving the office a "very good " response, please contact our office to try to remedy the situation. We may be reached at [phone]. Thank you for taking the time out of your busy day to complete the survey.  Kai Levins, MD [facility]

## 2022-01-25 NOTE — Addendum Note (Signed)
Addended by: Renae Gloss on: 01/25/2022 04:15 PM   Modules accepted: Orders

## 2022-01-26 ENCOUNTER — Ambulatory Visit (INDEPENDENT_AMBULATORY_CARE_PROVIDER_SITE_OTHER): Payer: Medicare Other | Admitting: Orthopedic Surgery

## 2022-01-26 DIAGNOSIS — M25572 Pain in left ankle and joints of left foot: Secondary | ICD-10-CM

## 2022-01-28 DIAGNOSIS — G7001 Myasthenia gravis with (acute) exacerbation: Secondary | ICD-10-CM | POA: Diagnosis not present

## 2022-01-28 DIAGNOSIS — E785 Hyperlipidemia, unspecified: Secondary | ICD-10-CM | POA: Diagnosis not present

## 2022-01-28 DIAGNOSIS — Z79899 Other long term (current) drug therapy: Secondary | ICD-10-CM | POA: Diagnosis not present

## 2022-01-28 DIAGNOSIS — M545 Low back pain, unspecified: Secondary | ICD-10-CM | POA: Diagnosis not present

## 2022-01-28 DIAGNOSIS — J45909 Unspecified asthma, uncomplicated: Secondary | ICD-10-CM | POA: Diagnosis not present

## 2022-01-28 DIAGNOSIS — R0781 Pleurodynia: Secondary | ICD-10-CM | POA: Diagnosis not present

## 2022-01-28 DIAGNOSIS — L299 Pruritus, unspecified: Secondary | ICD-10-CM | POA: Diagnosis not present

## 2022-01-28 DIAGNOSIS — E1169 Type 2 diabetes mellitus with other specified complication: Secondary | ICD-10-CM | POA: Diagnosis not present

## 2022-01-28 DIAGNOSIS — I10 Essential (primary) hypertension: Secondary | ICD-10-CM | POA: Diagnosis not present

## 2022-01-28 DIAGNOSIS — Z79891 Long term (current) use of opiate analgesic: Secondary | ICD-10-CM | POA: Diagnosis not present

## 2022-01-28 DIAGNOSIS — T380X5D Adverse effect of glucocorticoids and synthetic analogues, subsequent encounter: Secondary | ICD-10-CM | POA: Diagnosis not present

## 2022-01-28 DIAGNOSIS — G8929 Other chronic pain: Secondary | ICD-10-CM | POA: Diagnosis not present

## 2022-01-30 ENCOUNTER — Telehealth: Payer: Self-pay | Admitting: Neurology

## 2022-01-30 NOTE — Telephone Encounter (Signed)
[facility] and provided her with verbal orders for patient.

## 2022-01-30 NOTE — Telephone Encounter (Signed)
Monique from [facility] called for verbal orders: 1 week 1,  2 week 3,  1 week 3 for strengthening , endurance, and balance training and also home safety.

## 2022-01-31 ENCOUNTER — Ambulatory Visit: Payer: Medicare Other | Admitting: Internal Medicine

## 2022-01-31 DIAGNOSIS — T380X5D Adverse effect of glucocorticoids and synthetic analogues, subsequent encounter: Secondary | ICD-10-CM | POA: Diagnosis not present

## 2022-01-31 DIAGNOSIS — J45909 Unspecified asthma, uncomplicated: Secondary | ICD-10-CM | POA: Diagnosis not present

## 2022-01-31 DIAGNOSIS — G7001 Myasthenia gravis with (acute) exacerbation: Secondary | ICD-10-CM | POA: Diagnosis not present

## 2022-01-31 DIAGNOSIS — R0781 Pleurodynia: Secondary | ICD-10-CM | POA: Diagnosis not present

## 2022-01-31 DIAGNOSIS — E785 Hyperlipidemia, unspecified: Secondary | ICD-10-CM | POA: Diagnosis not present

## 2022-01-31 DIAGNOSIS — E1169 Type 2 diabetes mellitus with other specified complication: Secondary | ICD-10-CM | POA: Diagnosis not present

## 2022-02-02 DIAGNOSIS — R0781 Pleurodynia: Secondary | ICD-10-CM | POA: Diagnosis not present

## 2022-02-02 DIAGNOSIS — E1169 Type 2 diabetes mellitus with other specified complication: Secondary | ICD-10-CM | POA: Diagnosis not present

## 2022-02-02 DIAGNOSIS — J45909 Unspecified asthma, uncomplicated: Secondary | ICD-10-CM | POA: Diagnosis not present

## 2022-02-02 DIAGNOSIS — T380X5D Adverse effect of glucocorticoids and synthetic analogues, subsequent encounter: Secondary | ICD-10-CM | POA: Diagnosis not present

## 2022-02-02 DIAGNOSIS — G7001 Myasthenia gravis with (acute) exacerbation: Secondary | ICD-10-CM | POA: Diagnosis not present

## 2022-02-02 DIAGNOSIS — E785 Hyperlipidemia, unspecified: Secondary | ICD-10-CM | POA: Diagnosis not present
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
## 2022-02-05 DIAGNOSIS — E1169 Type 2 diabetes mellitus with other specified complication: Secondary | ICD-10-CM | POA: Diagnosis not present

## 2022-02-05 DIAGNOSIS — G7001 Myasthenia gravis with (acute) exacerbation: Secondary | ICD-10-CM | POA: Diagnosis not present

## 2022-02-05 DIAGNOSIS — T380X5D Adverse effect of glucocorticoids and synthetic analogues, subsequent encounter: Secondary | ICD-10-CM | POA: Diagnosis not present

## 2022-02-05 DIAGNOSIS — E785 Hyperlipidemia, unspecified: Secondary | ICD-10-CM | POA: Diagnosis not present

## 2022-02-05 DIAGNOSIS — J45909 Unspecified asthma, uncomplicated: Secondary | ICD-10-CM | POA: Diagnosis not present

## 2022-02-05 DIAGNOSIS — R0781 Pleurodynia: Secondary | ICD-10-CM | POA: Diagnosis not present

## 2022-02-06 ENCOUNTER — Ambulatory Visit: Payer: Medicare Other | Admitting: Internal Medicine

## 2022-02-06 ENCOUNTER — Encounter: Payer: Self-pay | Admitting: Orthopedic Surgery
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
## 2022-02-07 ENCOUNTER — Ambulatory Visit: Payer: Medicare Other | Admitting: Family Medicine

## 2022-02-16 DIAGNOSIS — E785 Hyperlipidemia, unspecified: Secondary | ICD-10-CM | POA: Diagnosis not present

## 2022-02-16 DIAGNOSIS — R0781 Pleurodynia: Secondary | ICD-10-CM | POA: Diagnosis not present

## 2022-02-16 DIAGNOSIS — T380X5D Adverse effect of glucocorticoids and synthetic analogues, subsequent encounter: Secondary | ICD-10-CM | POA: Diagnosis not present

## 2022-02-16 DIAGNOSIS — J45909 Unspecified asthma, uncomplicated: Secondary | ICD-10-CM | POA: Diagnosis not present

## 2022-02-16 DIAGNOSIS — E1169 Type 2 diabetes mellitus with other specified complication: Secondary | ICD-10-CM | POA: Diagnosis not present

## 2022-02-16 DIAGNOSIS — G7001 Myasthenia gravis with (acute) exacerbation: Secondary | ICD-10-CM | POA: Diagnosis not present

## 2022-02-17 ENCOUNTER — Other Ambulatory Visit (HOSPITAL_BASED_OUTPATIENT_CLINIC_OR_DEPARTMENT_OTHER): Payer: Self-pay

## 2022-02-17 DIAGNOSIS — K589 Irritable bowel syndrome without diarrhea: Secondary | ICD-10-CM | POA: Diagnosis not present

## 2022-02-17 DIAGNOSIS — K219 Gastro-esophageal reflux disease without esophagitis: Secondary | ICD-10-CM | POA: Diagnosis not present

## 2022-02-17 DIAGNOSIS — E119 Type 2 diabetes mellitus without complications: Secondary | ICD-10-CM | POA: Diagnosis not present

## 2022-02-21 NOTE — Progress Notes (Signed)
[facility] [place] [facility] Quality Team Note  Name: Jade Mathis Date of Birth: October 19, 1975 MRN: [id number] Date: 02/21/2022  [facility] Quality Team has reviewed this patient's chart, please see recommendations below:  [facility] Quality Other; (KED [facility] URINE MICROALBUMIN/CREATININE RATIO TEST COMPLETED FOR GAP CLOSURE.)

## 2022-02-25 DIAGNOSIS — T380X5D Adverse effect of glucocorticoids and synthetic analogues, subsequent encounter: Secondary | ICD-10-CM | POA: Diagnosis not present

## 2022-02-25 DIAGNOSIS — R0781 Pleurodynia: Secondary | ICD-10-CM | POA: Diagnosis not present

## 2022-02-25 DIAGNOSIS — J45909 Unspecified asthma, uncomplicated: Secondary | ICD-10-CM | POA: Diagnosis not present

## 2022-02-25 DIAGNOSIS — G7001 Myasthenia gravis with (acute) exacerbation: Secondary | ICD-10-CM | POA: Diagnosis not present

## 2022-02-25 DIAGNOSIS — E1169 Type 2 diabetes mellitus with other specified complication: Secondary | ICD-10-CM | POA: Diagnosis not present

## 2022-02-25 DIAGNOSIS — E785 Hyperlipidemia, unspecified: Secondary | ICD-10-CM | POA: Diagnosis not present

## 2022-02-26 DIAGNOSIS — G8929 Other chronic pain: Secondary | ICD-10-CM | POA: Diagnosis not present

## 2022-02-26 DIAGNOSIS — Z79899 Other long term (current) drug therapy: Secondary | ICD-10-CM | POA: Diagnosis not present

## 2022-02-26 DIAGNOSIS — M545 Low back pain, unspecified: Secondary | ICD-10-CM | POA: Diagnosis not present

## 2022-02-26 DIAGNOSIS — Z79891 Long term (current) use of opiate analgesic: Secondary | ICD-10-CM | POA: Diagnosis not present

## 2022-03-01 DIAGNOSIS — Z79899 Other long term (current) drug therapy: Secondary | ICD-10-CM | POA: Diagnosis not present

## 2022-03-01 DIAGNOSIS — R0781 Pleurodynia: Secondary | ICD-10-CM | POA: Diagnosis not present

## 2022-03-01 DIAGNOSIS — T380X5D Adverse effect of glucocorticoids and synthetic analogues, subsequent encounter: Secondary | ICD-10-CM | POA: Diagnosis not present

## 2022-03-01 DIAGNOSIS — E1169 Type 2 diabetes mellitus with other specified complication: Secondary | ICD-10-CM | POA: Diagnosis not present

## 2022-03-01 DIAGNOSIS — G7001 Myasthenia gravis with (acute) exacerbation: Secondary | ICD-10-CM | POA: Diagnosis not present

## 2022-03-01 DIAGNOSIS — J45909 Unspecified asthma, uncomplicated: Secondary | ICD-10-CM | POA: Diagnosis not present

## 2022-03-01 DIAGNOSIS — E785 Hyperlipidemia, unspecified: Secondary | ICD-10-CM | POA: Diagnosis not present

## 2022-03-10 DIAGNOSIS — G7001 Myasthenia gravis with (acute) exacerbation: Secondary | ICD-10-CM | POA: Diagnosis not present

## 2022-03-10 DIAGNOSIS — E1169 Type 2 diabetes mellitus with other specified complication: Secondary | ICD-10-CM | POA: Diagnosis not present

## 2022-03-10 DIAGNOSIS — J45909 Unspecified asthma, uncomplicated: Secondary | ICD-10-CM | POA: Diagnosis not present

## 2022-03-10 DIAGNOSIS — E785 Hyperlipidemia, unspecified: Secondary | ICD-10-CM | POA: Diagnosis not present

## 2022-03-10 DIAGNOSIS — R0781 Pleurodynia: Secondary | ICD-10-CM | POA: Diagnosis not present

## 2022-03-10 DIAGNOSIS — T380X5D Adverse effect of glucocorticoids and synthetic analogues, subsequent encounter: Secondary | ICD-10-CM | POA: Diagnosis not present

## 2022-03-16 ENCOUNTER — Telehealth: Payer: Self-pay

## 2022-03-16 NOTE — Telephone Encounter (Signed)
Patients Home health care called and wanted verbal orders for this patient for an additional 2 weeks of PT. They are sending orders for signature.

## 2022-03-16 NOTE — Telephone Encounter (Signed)
That is fine, thanks 

## 2022-03-22 DIAGNOSIS — E785 Hyperlipidemia, unspecified: Secondary | ICD-10-CM | POA: Diagnosis not present

## 2022-03-22 DIAGNOSIS — J45909 Unspecified asthma, uncomplicated: Secondary | ICD-10-CM | POA: Diagnosis not present

## 2022-03-22 DIAGNOSIS — G7001 Myasthenia gravis with (acute) exacerbation: Secondary | ICD-10-CM | POA: Diagnosis not present

## 2022-03-22 DIAGNOSIS — K219 Gastro-esophageal reflux disease without esophagitis: Secondary | ICD-10-CM | POA: Diagnosis not present

## 2022-03-22 DIAGNOSIS — E119 Type 2 diabetes mellitus without complications: Secondary | ICD-10-CM | POA: Diagnosis not present

## 2022-03-22 DIAGNOSIS — R0781 Pleurodynia: Secondary | ICD-10-CM | POA: Diagnosis not present

## 2022-03-22 DIAGNOSIS — K589 Irritable bowel syndrome without diarrhea: Secondary | ICD-10-CM | POA: Diagnosis not present

## 2022-03-22 DIAGNOSIS — T380X5D Adverse effect of glucocorticoids and synthetic analogues, subsequent encounter: Secondary | ICD-10-CM | POA: Diagnosis not present

## 2022-03-22 DIAGNOSIS — E1169 Type 2 diabetes mellitus with other specified complication: Secondary | ICD-10-CM | POA: Diagnosis not present

## 2022-03-27 DIAGNOSIS — G7001 Myasthenia gravis with (acute) exacerbation: Secondary | ICD-10-CM | POA: Diagnosis not present

## 2022-03-27 DIAGNOSIS — T380X5D Adverse effect of glucocorticoids and synthetic analogues, subsequent encounter: Secondary | ICD-10-CM | POA: Diagnosis not present

## 2022-03-27 DIAGNOSIS — J45909 Unspecified asthma, uncomplicated: Secondary | ICD-10-CM | POA: Diagnosis not present

## 2022-03-27 DIAGNOSIS — E785 Hyperlipidemia, unspecified: Secondary | ICD-10-CM | POA: Diagnosis not present

## 2022-03-27 DIAGNOSIS — R0781 Pleurodynia: Secondary | ICD-10-CM | POA: Diagnosis not present

## 2022-03-27 DIAGNOSIS — E1169 Type 2 diabetes mellitus with other specified complication: Secondary | ICD-10-CM | POA: Diagnosis not present

## 2022-03-29 DIAGNOSIS — E119 Type 2 diabetes mellitus without complications: Secondary | ICD-10-CM | POA: Diagnosis not present

## 2022-03-29 DIAGNOSIS — R051 Acute cough: Secondary | ICD-10-CM | POA: Diagnosis not present

## 2022-03-29 DIAGNOSIS — G8929 Other chronic pain: Secondary | ICD-10-CM | POA: Diagnosis not present

## 2022-03-29 DIAGNOSIS — Z79891 Long term (current) use of opiate analgesic: Secondary | ICD-10-CM | POA: Diagnosis not present

## 2022-03-29 DIAGNOSIS — G43111 Migraine with aura, intractable, with status migrainosus: Secondary | ICD-10-CM | POA: Diagnosis not present

## 2022-03-29 DIAGNOSIS — Z79899 Other long term (current) drug therapy: Secondary | ICD-10-CM | POA: Diagnosis not present

## 2022-03-29 DIAGNOSIS — M545 Low back pain, unspecified: Secondary | ICD-10-CM | POA: Diagnosis not present

## 2022-04-03 DIAGNOSIS — Z79899 Other long term (current) drug therapy: Secondary | ICD-10-CM | POA: Diagnosis not present

## 2022-04-10 ENCOUNTER — Ambulatory Visit (INDEPENDENT_AMBULATORY_CARE_PROVIDER_SITE_OTHER): Payer: 59 | Admitting: Internal Medicine

## 2022-04-10 ENCOUNTER — Encounter: Payer: Self-pay | Admitting: Internal Medicine

## 2022-04-10 ENCOUNTER — Other Ambulatory Visit: Payer: Self-pay | Admitting: Internal Medicine

## 2022-04-10 VITALS — BP 112/68 | HR 71 | Temp 98.4°F | Ht 66.0 in | Wt 189.0 lb

## 2022-04-10 DIAGNOSIS — Z6830 Body mass index (BMI) 30.0-30.9, adult: Secondary | ICD-10-CM

## 2022-04-10 DIAGNOSIS — G7000 Myasthenia gravis without (acute) exacerbation: Secondary | ICD-10-CM | POA: Diagnosis not present

## 2022-04-10 DIAGNOSIS — F1120 Opioid dependence, uncomplicated: Secondary | ICD-10-CM | POA: Diagnosis not present

## 2022-04-10 DIAGNOSIS — F4323 Adjustment disorder with mixed anxiety and depressed mood: Secondary | ICD-10-CM

## 2022-04-10 DIAGNOSIS — E6609 Other obesity due to excess calories: Secondary | ICD-10-CM | POA: Diagnosis not present

## 2022-04-10 MED ORDER — SEMAGLUTIDE-WEIGHT MANAGEMENT 0.5 MG/0.5ML ~~LOC~~ SOAJ: 0.5000 mg | SUBCUTANEOUS | 0 refills | Status: AC

## 2022-04-10 MED ORDER — SEMAGLUTIDE-WEIGHT MANAGEMENT 2.4 MG/0.75ML ~~LOC~~ SOAJ: 2.4000 mg | SUBCUTANEOUS | 0 refills | Status: DC

## 2022-04-10 MED ORDER — SEMAGLUTIDE-WEIGHT MANAGEMENT 0.25 MG/0.5ML ~~LOC~~ SOAJ: 0.2500 mg | SUBCUTANEOUS | 0 refills | Status: AC

## 2022-04-10 MED ORDER — SEMAGLUTIDE-WEIGHT MANAGEMENT 1.7 MG/0.75ML ~~LOC~~ SOAJ: 1.7000 mg | SUBCUTANEOUS | 0 refills | Status: DC

## 2022-04-10 MED ORDER — SEMAGLUTIDE-WEIGHT MANAGEMENT 1 MG/0.5ML ~~LOC~~ SOAJ: 1.0000 mg | SUBCUTANEOUS | 0 refills | Status: DC

## 2022-04-10 NOTE — Progress Notes (Signed)
   Subjective:   Patient ID: Jade Mathis, female    DOB: 1975/11/05, 47 y.o.   MRN: [id number]  HPI The patient is a 47 YO female coming in for concerns.  Review of Systems  Constitutional:  Positive for activity change and fatigue. Negative for appetite change, chills, fever and unexpected weight change.  Respiratory:  Positive for shortness of breath.   Cardiovascular: Negative.   Gastrointestinal: Negative.   Musculoskeletal:  Positive for arthralgias, back pain and myalgias. Negative for gait problem and joint swelling.  Skin: Negative.   Neurological: Negative.   Psychiatric/Behavioral:  Positive for decreased concentration.     Objective:  Physical Exam Constitutional:      Appearance: Normal appearance.  HENT:     Head: Normocephalic.  Cardiovascular:     Rate and Rhythm: Normal rate and regular rhythm.  Pulmonary:     Effort: Pulmonary effort is normal.  Musculoskeletal:        General: Tenderness present. Normal range of motion.  Skin:    General: Skin is warm and dry.  Neurological:     General: No focal deficit present.     Mental Status: She is alert and oriented to person, place, and time.     Coordination: Coordination abnormal.     Vitals:   04/10/22 0947  BP: 112/68  Pulse: 71  Temp: 98.4 F (36.9 C)  TempSrc: Oral  SpO2: 98%  Weight: 189 lb (85.7 kg)  Height: '5\' 6"'$  (1.676 m)    Assessment & Plan:  Visit time 20 minutes in face to face communication with patient and coordination of care, additional 10 minutes spent in record review, coordination or care, ordering tests, communicating/referring to other healthcare professionals, documenting in medical records all on the same day of the visit for total time 30 minutes spent on the visit.

## 2022-04-10 NOTE — Assessment & Plan Note (Signed)
Recent visit with neurology she was getting home health PT/OT and they did decrease prednisone dosing. She has not noticed a change since this. Advised to continue 7.5 mg prednisone daily and aziathioprine and mestinon.

## 2022-04-10 NOTE — Assessment & Plan Note (Signed)
She is requesting wegovy for weight loss which is prescribed. If this is not covered it is unclear what our other options will be. She is taking opioids chronically so contrave is not a good option and qsymia is also not a good option as she is already on topiramate. With her myastenia gravis medication options are limited. We could consider wellbutrin but given her mental health and the fact that cymbalta also provides atypical pain coverage better I am not sure I would want to change this. We could try orlistat as that would be safe.

## 2022-04-10 NOTE — Assessment & Plan Note (Signed)
She is wanting note to return to school/work and had been going through some counseling. I do not have a reason she is out of school/work. We have done multiple notes related to her school situation and she is wanting to pursue finishing her education so note done today.

## 2022-04-10 NOTE — Patient Instructions (Addendum)
We have done the note to go back to school and this is on mychart.  We have sent in wegovy and will try to get this approved.
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
## 2022-04-20 DIAGNOSIS — K219 Gastro-esophageal reflux disease without esophagitis: Secondary | ICD-10-CM | POA: Diagnosis not present

## 2022-04-20 DIAGNOSIS — E119 Type 2 diabetes mellitus without complications: Secondary | ICD-10-CM | POA: Diagnosis not present

## 2022-04-20 DIAGNOSIS — K589 Irritable bowel syndrome without diarrhea: Secondary | ICD-10-CM | POA: Diagnosis not present

## 2022-04-26 DIAGNOSIS — G8929 Other chronic pain: Secondary | ICD-10-CM | POA: Diagnosis not present

## 2022-04-26 DIAGNOSIS — M545 Low back pain, unspecified: Secondary | ICD-10-CM | POA: Diagnosis not present

## 2022-04-26 DIAGNOSIS — Z79891 Long term (current) use of opiate analgesic: Secondary | ICD-10-CM | POA: Diagnosis not present

## 2022-04-26 DIAGNOSIS — Z79899 Other long term (current) drug therapy: Secondary | ICD-10-CM | POA: Diagnosis not present

## 2022-04-27 ENCOUNTER — Ambulatory Visit: Payer: 59 | Admitting: Neurology

## 2022-05-03 ENCOUNTER — Encounter: Payer: Self-pay | Admitting: Internal Medicine

## 2022-05-04 NOTE — Telephone Encounter (Signed)
Patient called back - she still has not be contacted concerning this.  Please advise.

## 2022-05-10 ENCOUNTER — Encounter: Payer: Self-pay | Admitting: Internal Medicine

## 2022-05-11 ENCOUNTER — Other Ambulatory Visit (HOSPITAL_COMMUNITY): Payer: Self-pay

## 2022-05-11 NOTE — Telephone Encounter (Signed)
Pt has Medicare Part D, unfortunately, they do not cover any weight loss drugs. No PA submitted at this time.

## 2022-05-14 ENCOUNTER — Other Ambulatory Visit: Payer: Self-pay | Admitting: Internal Medicine

## 2022-05-16 ENCOUNTER — Telehealth: Payer: Self-pay

## 2022-05-16 NOTE — Telephone Encounter (Signed)
Called patient lvm to return call, to complete AWV at [phone].  If no return call within 15 minutes, patient may reschedule for the next available appointment with [facility] or CMA.  Abdulkadir Emmanuel N. Michaeal Davis, LPN. [facility] Team Direct Dial: [phone]

## 2022-05-23 DIAGNOSIS — Z1231 Encounter for screening mammogram for malignant neoplasm of breast: Secondary | ICD-10-CM | POA: Diagnosis not present

## 2022-05-23 LAB — HM MAMMOGRAPHY

## 2022-05-24 DIAGNOSIS — Z903 Acquired absence of stomach [part of]: Secondary | ICD-10-CM | POA: Diagnosis not present

## 2022-05-24 DIAGNOSIS — K912 Postsurgical malabsorption, not elsewhere classified: Secondary | ICD-10-CM | POA: Diagnosis not present

## 2022-05-24 DIAGNOSIS — L304 Erythema intertrigo: Secondary | ICD-10-CM | POA: Diagnosis not present

## 2022-05-24 DIAGNOSIS — E119 Type 2 diabetes mellitus without complications: Secondary | ICD-10-CM | POA: Diagnosis not present

## 2022-05-24 DIAGNOSIS — E0500 Thyrotoxicosis with diffuse goiter without thyrotoxic crisis or storm: Secondary | ICD-10-CM | POA: Diagnosis not present

## 2022-05-26 LAB — CBC AND DIFFERENTIAL: Hemoglobin: 11.8 — AB (ref 12.0–16.0)

## 2022-05-27 DIAGNOSIS — M545 Low back pain, unspecified: Secondary | ICD-10-CM | POA: Diagnosis not present

## 2022-05-27 DIAGNOSIS — Z79899 Other long term (current) drug therapy: Secondary | ICD-10-CM | POA: Diagnosis not present

## 2022-05-30 ENCOUNTER — Ambulatory Visit: Payer: 59 | Admitting: Internal Medicine

## 2022-05-30 ENCOUNTER — Other Ambulatory Visit (HOSPITAL_BASED_OUTPATIENT_CLINIC_OR_DEPARTMENT_OTHER): Payer: Self-pay

## 2022-05-30 LAB — HM PAP SMEAR: HPV, high-risk: NEGATIVE

## 2022-05-30 MED ORDER — MOUNJARO 2.5 MG/0.5ML ~~LOC~~ SOAJ
2.5000 mg | SUBCUTANEOUS | 2 refills | Status: DC
  Filled 2022-05-30: qty 2, 28d supply, fill #0
  Filled 2022-06-25: qty 2, 28d supply, fill #1

## 2022-05-30 MED ORDER — MOUNJARO 2.5 MG/0.5ML ~~LOC~~ SOAJ
SUBCUTANEOUS | 1 refills | Status: DC
  Filled 2022-05-30 – 2022-06-25 (×2): qty 2, 28d supply, fill #0
  Filled 2022-07-20: qty 2, 28d supply, fill #1

## 2022-05-31 ENCOUNTER — Other Ambulatory Visit (HOSPITAL_BASED_OUTPATIENT_CLINIC_OR_DEPARTMENT_OTHER): Payer: Self-pay

## 2022-05-31 DIAGNOSIS — Z79899 Other long term (current) drug therapy: Secondary | ICD-10-CM | POA: Diagnosis not present

## 2022-06-05 ENCOUNTER — Ambulatory Visit: Payer: 59 | Admitting: Internal Medicine

## 2022-06-12 ENCOUNTER — Ambulatory Visit (INDEPENDENT_AMBULATORY_CARE_PROVIDER_SITE_OTHER): Payer: 59 | Admitting: Podiatry

## 2022-06-12 ENCOUNTER — Encounter: Payer: Self-pay | Admitting: Podiatry

## 2022-06-12 ENCOUNTER — Ambulatory Visit (INDEPENDENT_AMBULATORY_CARE_PROVIDER_SITE_OTHER): Payer: 59 | Admitting: Internal Medicine

## 2022-06-12 ENCOUNTER — Encounter: Payer: Self-pay | Admitting: Internal Medicine

## 2022-06-12 VITALS — BP 100/64 | HR 77 | Temp 98.0°F | Ht 66.0 in | Wt 190.0 lb

## 2022-06-12 DIAGNOSIS — E1169 Type 2 diabetes mellitus with other specified complication: Secondary | ICD-10-CM | POA: Diagnosis not present

## 2022-06-12 DIAGNOSIS — M79675 Pain in left toe(s): Secondary | ICD-10-CM | POA: Diagnosis not present

## 2022-06-12 DIAGNOSIS — M79674 Pain in right toe(s): Secondary | ICD-10-CM

## 2022-06-12 DIAGNOSIS — R21 Rash and other nonspecific skin eruption: Secondary | ICD-10-CM | POA: Insufficient documentation

## 2022-06-12 DIAGNOSIS — E0842 Diabetes mellitus due to underlying condition with diabetic polyneuropathy: Secondary | ICD-10-CM | POA: Diagnosis not present

## 2022-06-12 DIAGNOSIS — G43909 Migraine, unspecified, not intractable, without status migrainosus: Secondary | ICD-10-CM

## 2022-06-12 DIAGNOSIS — M2041 Other hammer toe(s) (acquired), right foot: Secondary | ICD-10-CM | POA: Diagnosis not present

## 2022-06-12 DIAGNOSIS — M79605 Pain in left leg: Secondary | ICD-10-CM

## 2022-06-12 DIAGNOSIS — M79604 Pain in right leg: Secondary | ICD-10-CM

## 2022-06-12 DIAGNOSIS — M2010 Hallux valgus (acquired), unspecified foot: Secondary | ICD-10-CM

## 2022-06-12 DIAGNOSIS — M2042 Other hammer toe(s) (acquired), left foot: Secondary | ICD-10-CM

## 2022-06-12 DIAGNOSIS — E669 Obesity, unspecified: Secondary | ICD-10-CM

## 2022-06-12 MED ORDER — NYSTATIN 100000 UNIT/GM EX POWD: 1.0000 | Freq: Three times a day (TID) | CUTANEOUS | 11 refills | Status: DC

## 2022-06-12 MED ORDER — ZOLMITRIPTAN 5 MG PO TABS: 5.0000 mg | ORAL_TABLET | ORAL | 0 refills | Status: AC | PRN

## 2022-06-12 MED ORDER — NYSTATIN-TRIAMCINOLONE 100000-0.1 UNIT/GM-% EX OINT: 1.0000 | TOPICAL_OINTMENT | Freq: Two times a day (BID) | CUTANEOUS | 6 refills | Status: DC

## 2022-06-12 MED ORDER — IBUPROFEN 400 MG PO TABS: 400.0000 mg | ORAL_TABLET | Freq: Four times a day (QID) | ORAL | 3 refills | Status: DC | PRN

## 2022-06-12 NOTE — Assessment & Plan Note (Signed)
She requests ibuprofen 800 mg refill. Celebrex is still on her list so I asked if she was taking this. She declined that she was taking this so it was removed. With her gastric weight loss procedure she is high risk for GI bleeding. Advised her and rx ibuprofen 400 mg #90 with 3 refills. She did want to proceed with this rx.

## 2022-06-12 NOTE — Assessment & Plan Note (Signed)
She is on multiple medications and given her MG and multiple medications I do not feel comfortable managing her migraines long term. Rx zomig to use prn migraines up to 10 per month. Given 1 month supply if effective she will ask her pain management provider to prescribe (since they are treating her migraines).

## 2022-06-12 NOTE — Patient Instructions (Addendum)
We have sent in the powder and the cream to use for the rash when needed. I would use the powder most of the time and the cream when this is irritated.   We have sent in the zomig.

## 2022-06-12 NOTE — Progress Notes (Signed)
This patient presents to the office for diabetic foot exam.   This patient says there is no pain or discomfort in her feet.  No history of infection or drainage.  This patient presents to the office for foot exam due to having a history of diabetes. She requests diabetic shoes today.  Vascular  Dorsalis pedis and posterior tibial pulses are palpable  B/L.  Capillary return  WNL.  Temperature gradient is  WNL.  Skin turgor  WNL  Sensorium  Senn Weinstein monofilament wire diminished  B/L.Marland Kitchen Normal tactile sensation.  Nail Exam  Patient has normal nails with no evidence of bacterial or fungal infection.  Orthopedic  Exam  Muscle tone and muscle strength  WNL.  No limitations of motion feet  B/L.  No crepitus or joint effusion noted.  Foot type is unremarkable and digits show no abnormalities.  HAV  B/L.  Hammer toes  B/L  Skin  No open lesions.  Normal skin texture and turgor.   Diabetes with no complications  Diabetic foot exam was performed.  There is no evidence of vascular pathology.  LOPS diminished.  Patient qualifies for diabetic shoes due to DPN, HAV and hammer toes  B/L. RTC   Gardiner Barefoot DPM

## 2022-06-12 NOTE — Progress Notes (Signed)
   Subjective:   Patient ID: Jade Mathis, female    DOB: May 24, 1975, 47 y.o.   MRN: [id number]  HPI The patient is a 47 YO female coming in for rash. Off and on for a year. Has tried many different things on it including creams, powders.   Review of Systems  Constitutional: Negative.   HENT: Negative.    Eyes: Negative.   Respiratory:  Negative for cough, chest tightness and shortness of breath.   Cardiovascular:  Negative for chest pain, palpitations and leg swelling.  Gastrointestinal:  Negative for abdominal distention, abdominal pain, constipation, diarrhea, nausea and vomiting.  Musculoskeletal:  Positive for arthralgias and myalgias.  Skin:  Positive for rash.  Neurological:  Positive for headaches.  Psychiatric/Behavioral: Negative.      Objective:  Physical Exam Constitutional:      Appearance: She is well-developed.  HENT:     Head: Normocephalic and atraumatic.  Cardiovascular:     Rate and Rhythm: Normal rate and regular rhythm.  Pulmonary:     Effort: Pulmonary effort is normal. No respiratory distress.     Breath sounds: Normal breath sounds. No wheezing or rales.  Abdominal:     General: Bowel sounds are normal. There is no distension.     Palpations: Abdomen is soft.     Tenderness: There is no abdominal tenderness. There is no rebound.  Musculoskeletal:        General: Tenderness present.     Cervical back: Normal range of motion.  Skin:    General: Skin is warm and dry.  Neurological:     Mental Status: She is alert and oriented to person, place, and time.     Coordination: Coordination normal.     Vitals:   06/12/22 0933  BP: 100/64  Pulse: 77  Temp: 98 F (36.7 C)  TempSrc: Oral  SpO2: 92%  Weight: 190 lb (86.2 kg)  Height: 5\' 6"  (1.676 m)    Assessment & Plan:

## 2022-06-12 NOTE — Progress Notes (Signed)
Abtracted from [employer]

## 2022-06-12 NOTE — Assessment & Plan Note (Signed)
Chronic skin irritation from skin folds and with weight loss. Rx nystatin powder to use all the time and nystatin/triamcinolone to use when irritated.

## 2022-06-12 NOTE — Assessment & Plan Note (Signed)
Concern for overmedication. She was taking ozempic 1 mg weekly (through rx fill) and wanted to take wegovy through [place] (which was not approved) and recently precribed mounjaro through bariatrics which she has started. Also taking jardiance and metformin as well. No recent Hga1c in diabetic range and prior low sugars due to bariatric surgery being unable to eat a lot. She states medication list is accurate but several old medications on list she then states she is not taking. Asked her to get all diabetes medications through only 1 doctor so she is not as at risk for side effects and hypoglycemia.

## 2022-06-21 ENCOUNTER — Other Ambulatory Visit: Payer: 59

## 2022-06-21 ENCOUNTER — Ambulatory Visit: Payer: 59 | Admitting: Podiatry

## 2022-06-23 ENCOUNTER — Other Ambulatory Visit (HOSPITAL_BASED_OUTPATIENT_CLINIC_OR_DEPARTMENT_OTHER): Payer: Self-pay

## 2022-06-23 MED ORDER — MOUNJARO 5 MG/0.5ML ~~LOC~~ SOAJ
5.0000 mg | SUBCUTANEOUS | 0 refills | Status: DC
  Filled 2022-06-23: qty 2, 28d supply, fill #0

## 2022-06-25 ENCOUNTER — Other Ambulatory Visit (HOSPITAL_BASED_OUTPATIENT_CLINIC_OR_DEPARTMENT_OTHER): Payer: Self-pay

## 2022-06-26 ENCOUNTER — Other Ambulatory Visit (HOSPITAL_BASED_OUTPATIENT_CLINIC_OR_DEPARTMENT_OTHER): Payer: Self-pay

## 2022-06-26 ENCOUNTER — Other Ambulatory Visit: Payer: Self-pay

## 2022-06-26 DIAGNOSIS — Z903 Acquired absence of stomach [part of]: Secondary | ICD-10-CM | POA: Diagnosis not present

## 2022-06-26 DIAGNOSIS — K912 Postsurgical malabsorption, not elsewhere classified: Secondary | ICD-10-CM | POA: Diagnosis not present

## 2022-06-26 DIAGNOSIS — E119 Type 2 diabetes mellitus without complications: Secondary | ICD-10-CM | POA: Diagnosis not present

## 2022-06-27 DIAGNOSIS — M545 Low back pain, unspecified: Secondary | ICD-10-CM | POA: Diagnosis not present

## 2022-06-27 DIAGNOSIS — G8929 Other chronic pain: Secondary | ICD-10-CM | POA: Diagnosis not present

## 2022-06-27 DIAGNOSIS — Z760 Encounter for issue of repeat prescription: Secondary | ICD-10-CM | POA: Diagnosis not present

## 2022-06-27 DIAGNOSIS — Z79899 Other long term (current) drug therapy: Secondary | ICD-10-CM | POA: Diagnosis not present

## 2022-06-30 ENCOUNTER — Other Ambulatory Visit (HOSPITAL_BASED_OUTPATIENT_CLINIC_OR_DEPARTMENT_OTHER): Payer: Self-pay

## 2022-06-30 ENCOUNTER — Ambulatory Visit (INDEPENDENT_AMBULATORY_CARE_PROVIDER_SITE_OTHER): Payer: 59 | Admitting: Sports Medicine

## 2022-06-30 VITALS — BP 120/80 | HR 83 | Ht 66.0 in | Wt 187.0 lb

## 2022-06-30 DIAGNOSIS — R519 Headache, unspecified: Secondary | ICD-10-CM

## 2022-06-30 DIAGNOSIS — M25532 Pain in left wrist: Secondary | ICD-10-CM

## 2022-06-30 DIAGNOSIS — G43909 Migraine, unspecified, not intractable, without status migrainosus: Secondary | ICD-10-CM | POA: Diagnosis not present

## 2022-06-30 DIAGNOSIS — R42 Dizziness and giddiness: Secondary | ICD-10-CM | POA: Diagnosis not present

## 2022-06-30 MED ORDER — MOUNJARO 7.5 MG/0.5ML ~~LOC~~ SOAJ
7.5000 mg | SUBCUTANEOUS | 0 refills | Status: DC
  Filled 2022-06-30: qty 2, 28d supply, fill #0
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
## 2022-06-30 NOTE — Patient Instructions (Addendum)
Good to see you Neurology referral  Recommend reaching out to [facility] neurology to ask for an extension on home health physical therapy  Use tylenol, ibuprofen and Ice for wrist pain  Follow up as needed for wrist pain

## 2022-07-03 DIAGNOSIS — Z79899 Other long term (current) drug therapy: Secondary | ICD-10-CM | POA: Diagnosis not present

## 2022-07-05 ENCOUNTER — Other Ambulatory Visit (HOSPITAL_BASED_OUTPATIENT_CLINIC_OR_DEPARTMENT_OTHER): Payer: Self-pay

## 2022-07-05 ENCOUNTER — Telehealth (INDEPENDENT_AMBULATORY_CARE_PROVIDER_SITE_OTHER): Payer: 59 | Admitting: Internal Medicine

## 2022-07-05 ENCOUNTER — Encounter: Payer: Self-pay | Admitting: Internal Medicine

## 2022-07-05 DIAGNOSIS — R21 Rash and other nonspecific skin eruption: Secondary | ICD-10-CM

## 2022-07-05 DIAGNOSIS — R159 Full incontinence of feces: Secondary | ICD-10-CM | POA: Diagnosis not present

## 2022-07-05 MED ORDER — NYSTATIN-TRIAMCINOLONE 100000-0.1 UNIT/GM-% EX OINT
1.0000 | TOPICAL_OINTMENT | Freq: Two times a day (BID) | CUTANEOUS | 6 refills | Status: DC
  Filled 2022-07-05: qty 15, 7d supply, fill #0
  Filled 2022-07-25: qty 30, 15d supply, fill #1
  Filled 2022-08-04: qty 30, 15d supply, fill #2

## 2022-07-05 MED ORDER — TRIAMCINOLONE ACETONIDE 0.5 % EX OINT
1.0000 | TOPICAL_OINTMENT | Freq: Two times a day (BID) | CUTANEOUS | 6 refills | Status: AC
  Filled 2022-07-05: qty 90, 45d supply, fill #0
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
## 2022-07-06 ENCOUNTER — Other Ambulatory Visit (HOSPITAL_BASED_OUTPATIENT_CLINIC_OR_DEPARTMENT_OTHER): Payer: Self-pay

## 2022-07-06 NOTE — Assessment & Plan Note (Signed)
Was unable to get nystatin/triamcinolone cream and rx two separate creams to use both as this is more cost effective.

## 2022-07-06 NOTE — Assessment & Plan Note (Signed)
It is unclear if stool burden is causing some of her liquid stools. She has had increase in last few months. She states taking amitiza but likely is not taking regularly per rx as she was only given 6 month supply and this has not run out yet (7 months later). Ordered x-ray to assess. She is taking mounjaro and opioids both of which are decreasing GI motility. Asked her to return to GI as well depending on results.

## 2022-07-07 ENCOUNTER — Encounter: Payer: Self-pay | Admitting: Internal Medicine

## 2022-07-07 ENCOUNTER — Encounter: Payer: Self-pay | Admitting: Neurology

## 2022-07-07 ENCOUNTER — Other Ambulatory Visit: Payer: Self-pay | Admitting: Internal Medicine

## 2022-07-07 DIAGNOSIS — R634 Abnormal weight loss: Secondary | ICD-10-CM | POA: Diagnosis not present

## 2022-07-07 DIAGNOSIS — M793 Panniculitis, unspecified: Secondary | ICD-10-CM | POA: Diagnosis not present

## 2022-07-07 DIAGNOSIS — E65 Localized adiposity: Secondary | ICD-10-CM | POA: Diagnosis not present

## 2022-07-08 ENCOUNTER — Other Ambulatory Visit (HOSPITAL_BASED_OUTPATIENT_CLINIC_OR_DEPARTMENT_OTHER): Payer: Self-pay

## 2022-07-11 NOTE — Telephone Encounter (Signed)
Pt called in stating she thinks it's [facility]. It's the same place he did one previously. They just needed to wait a month or so for insurance purposes.

## 2022-07-12 DIAGNOSIS — E039 Hypothyroidism, unspecified: Secondary | ICD-10-CM | POA: Diagnosis not present

## 2022-07-12 DIAGNOSIS — E1140 Type 2 diabetes mellitus with diabetic neuropathy, unspecified: Secondary | ICD-10-CM | POA: Diagnosis not present

## 2022-07-17 ENCOUNTER — Telehealth: Payer: Self-pay

## 2022-07-19 DIAGNOSIS — Z79899 Other long term (current) drug therapy: Secondary | ICD-10-CM | POA: Diagnosis not present

## 2022-07-19 DIAGNOSIS — M545 Low back pain, unspecified: Secondary | ICD-10-CM | POA: Diagnosis not present

## 2022-07-19 DIAGNOSIS — M542 Cervicalgia: Secondary | ICD-10-CM | POA: Diagnosis not present

## 2022-07-19 DIAGNOSIS — G8929 Other chronic pain: Secondary | ICD-10-CM | POA: Diagnosis not present

## 2022-07-20 ENCOUNTER — Encounter (HOSPITAL_BASED_OUTPATIENT_CLINIC_OR_DEPARTMENT_OTHER): Payer: Self-pay | Admitting: Emergency Medicine

## 2022-07-20 ENCOUNTER — Emergency Department (HOSPITAL_BASED_OUTPATIENT_CLINIC_OR_DEPARTMENT_OTHER): Payer: 59

## 2022-07-20 ENCOUNTER — Encounter: Payer: Self-pay | Admitting: Internal Medicine

## 2022-07-20 ENCOUNTER — Other Ambulatory Visit (HOSPITAL_BASED_OUTPATIENT_CLINIC_OR_DEPARTMENT_OTHER): Payer: Self-pay

## 2022-07-20 ENCOUNTER — Emergency Department (HOSPITAL_BASED_OUTPATIENT_CLINIC_OR_DEPARTMENT_OTHER)
Admission: EM | Admit: 2022-07-20 | Discharge: 2022-07-20 | Disposition: A | Discharge status: Home / Self Care | Payer: 59 | Attending: Emergency Medicine | Admitting: Emergency Medicine

## 2022-07-20 ENCOUNTER — Emergency Department (HOSPITAL_BASED_OUTPATIENT_CLINIC_OR_DEPARTMENT_OTHER): Payer: 59 | Admitting: Radiology

## 2022-07-20 ENCOUNTER — Other Ambulatory Visit: Payer: Self-pay

## 2022-07-20 DIAGNOSIS — R109 Unspecified abdominal pain: Secondary | ICD-10-CM | POA: Diagnosis not present

## 2022-07-20 DIAGNOSIS — R140 Abdominal distension (gaseous): Secondary | ICD-10-CM | POA: Diagnosis not present

## 2022-07-20 DIAGNOSIS — R600 Localized edema: Secondary | ICD-10-CM | POA: Insufficient documentation

## 2022-07-20 DIAGNOSIS — R609 Edema, unspecified: Secondary | ICD-10-CM

## 2022-07-20 LAB — CBC WITH DIFFERENTIAL/PLATELET
Abs Immature Granulocytes: 0.03 10*3/uL (ref 0.00–0.07)
Basophils Absolute: 0 10*3/uL (ref 0.0–0.1)
Basophils Relative: 1 %
Eosinophils Absolute: 0.2 10*3/uL (ref 0.0–0.5)
Eosinophils Relative: 4 %
HCT: 40.2 % (ref 36.0–46.0)
Hemoglobin: 12.2 g/dL (ref 12.0–15.0)
Immature Granulocytes: 1 %
Lymphocytes Relative: 24 %
Lymphs Abs: 1.3 10*3/uL (ref 0.7–4.0)
MCH: 26.2 pg (ref 26.0–34.0)
MCHC: 30.3 g/dL (ref 30.0–36.0)
MCV: 86.5 fL (ref 80.0–100.0)
Monocytes Absolute: 0.4 10*3/uL (ref 0.1–1.0)
Monocytes Relative: 8 %
Neutro Abs: 3.6 10*3/uL (ref 1.7–7.7)
Neutrophils Relative %: 62 %
Platelets: 257 10*3/uL (ref 150–400)
RBC: 4.65 MIL/uL (ref 3.87–5.11)
RDW: 14.7 % (ref 11.5–15.5)
WBC: 5.6 10*3/uL (ref 4.0–10.5)
nRBC: 0 % (ref 0.0–0.2)

## 2022-07-20 LAB — COMPREHENSIVE METABOLIC PANEL
ALT: 14 U/L (ref 0–44)
AST: 20 U/L (ref 15–41)
Albumin: 3.8 g/dL (ref 3.5–5.0)
Alkaline Phosphatase: 62 U/L (ref 38–126)
Anion gap: 6 (ref 5–15)
BUN: 14 mg/dL (ref 6–20)
CO2: 29 mmol/L (ref 22–32)
Calcium: 8.7 mg/dL — ABNORMAL LOW (ref 8.9–10.3)
Chloride: 102 mmol/L (ref 98–111)
Creatinine, Ser: 0.61 mg/dL (ref 0.44–1.00)
GFR, Estimated: >60 mL/min (ref >60)
Glucose, Bld: 125 mg/dL — ABNORMAL HIGH (ref 70–99)
Potassium: 3.7 mmol/L (ref 3.5–5.1)
Sodium: 137 mmol/L (ref 135–145)
Total Bilirubin: 0.4 mg/dL (ref 0.3–1.2)
Total Protein: 7.2 g/dL (ref 6.5–8.1)

## 2022-07-20 LAB — LACTIC ACID, PLASMA: Lactic Acid, Venous: 2.1 mmol/L (ref 0.5–1.9)

## 2022-07-20 LAB — HCG, SERUM, QUALITATIVE: Preg, Serum: NEGATIVE

## 2022-07-20 LAB — LIPASE, BLOOD: Lipase: 37 U/L (ref 11–51)

## 2022-07-20 MED ORDER — SODIUM CHLORIDE 0.9 % IV BOLUS
500.0000 mL | Freq: Once | INTRAVENOUS | Status: AC
  Administered 2022-07-20: 500 mL via INTRAVENOUS

## 2022-07-20 MED ORDER — SIMETHICONE 40 MG/0.6ML PO SUSP (UNIT DOSE)
40.0000 mg | Freq: Once | ORAL | Status: AC
  Administered 2022-07-20: 40 mg via ORAL
  Filled 2022-07-20: qty 0.6

## 2022-07-20 MED ORDER — FUROSEMIDE 20 MG PO TABS
40.0000 mg | ORAL_TABLET | Freq: Every day | ORAL | 0 refills | Status: DC
  Filled 2022-07-20: qty 5, 2d supply, fill #0

## 2022-07-20 MED ORDER — IOHEXOL 300 MG/ML  SOLN
100.0000 mL | Freq: Once | INTRAMUSCULAR | Status: AC | PRN
  Administered 2022-07-20: 80 mL via INTRAVENOUS

## 2022-07-20 MED ORDER — PANTOPRAZOLE SODIUM 40 MG PO TBEC
40.0000 mg | DELAYED_RELEASE_TABLET | Freq: Once | ORAL | Status: AC
  Administered 2022-07-20: 40 mg via ORAL
  Filled 2022-07-20: qty 1

## 2022-07-20 MED ORDER — SIMETHICONE 80 MG PO CHEW
80.0000 mg | CHEWABLE_TABLET | Freq: Four times a day (QID) | ORAL | 0 refills | Status: AC | PRN
  Filled 2022-07-20: qty 30, 8d supply, fill #0

## 2022-07-20 MED ORDER — ONDANSETRON 8 MG PO TBDP
8.0000 mg | ORAL_TABLET | Freq: Three times a day (TID) | ORAL | 0 refills | Status: DC | PRN
  Filled 2022-07-20: qty 20, 7d supply, fill #0

## 2022-07-20 MED ORDER — MORPHINE SULFATE (PF) 4 MG/ML IV SOLN: 6.0000 mg | Freq: Once | INTRAVENOUS | Status: DC

## 2022-07-20 NOTE — ED Triage Notes (Signed)
Pt arrives pov, slow gait, c/o allergic reaction to recent injection last week. Pt endorses abdominal pain, bloating and LE swelling and chest tightness. Pt speaking in complete sentences. LE +1 edema

## 2022-07-20 NOTE — ED Provider Notes (Signed)
[place] EMERGENCY DEPARTMENT AT [facility] Provider Note   CSN: [id number] Arrival date & time: 07/20/22  1191     History  Chief Complaint  Patient presents with   Allergic Reaction    Jade Mathis is a 47 y.o. female.  HPI    47 year old female comes in with chief complaint of allergic reaction.  Patient states that she was recently started on Mounjaro.  Last week her dose was increased.  For the last week she has been feeling more bloated, having abdominal distention and she has put up 15 pounds of weight.  She is also noted pitting edema and suspects that she is caring fluid in her abdomen as well.  Patient has past medical history of PCOS, gastrectomy/bariatric surgery, myasthenia gravis, Graves' disease.  She denies any known history of kidney problems, heart problems or liver disease.  Patient denies heavy drinking or substance use disorder.  Patient is passing flatus, and having bowel movement.  She denies nausea or vomiting, just feels bloated for the last week.  Today, she is also experiencing generalized abdominal pain that is moderate to severe.  Patient has no history of PE, DVT and denies any shortness of breath, chest pain.  Home Medications Prior to Admission medications   Medication Sig Start Date End Date Taking? Authorizing Provider  furosemide (LASIX) 20 MG tablet Take 2 tablets (40 mg total) by mouth daily. 07/20/22  Yes Derwood Kaplan, MD  naloxone [facility]) nasal spray 4 mg/0.1 mL SMARTSIG:Both Nares 07/19/22  Yes [provider]  ondansetron (ZOFRAN-ODT) 8 MG disintegrating tablet Take 1 tablet (8 mg total) by mouth every 8 (eight) hours as needed for nausea. 07/20/22  Yes Rebeckah Masih, MD  OZEMPIC, 2 MG/DOSE, 8 MG/3ML SOPN Inject 2 mg into the skin once a week. 07/12/22  Yes [provider]  simethicone (GAS-X) 80 MG chewable tablet Chew 1 tablet (80 mg total) by mouth every 6 (six) hours as needed for flatulence. 07/20/22   Yes Derwood Kaplan, MD  acetaminophen (TYLENOL) 325 MG tablet Take 2 tablets (650 mg total) by mouth every 6 (six) hours as needed. 11/04/21   Gareth Eagle, PA-C  albuterol (VENTOLIN HFA) 108 (90 Base) MCG/ACT inhaler TAKE 2 PUFFS BY MOUTH EVERY 6 HOURS AS NEEDED FOR WHEEZE OR SHORTNESS OF BREATH 12/28/21   Myrlene Broker, MD  Atogepant (QULIPTA) 60 MG TABS Take 60 mg by mouth daily.    [provider]  azaTHIOprine (IMURAN) 50 MG tablet Take 3 tablets (150 mg total) by mouth in the morning. 01/25/22   Antony Madura, MD  b complex vitamins tablet Take 1 tablet by mouth daily.    [provider]  Blood Glucose Monitoring Suppl (ACCU-CHEK AVIVA PLUS) w/Device KIT Use to test blood sugar daily 01/24/19   Myrlene Broker, MD  cetirizine (ZYRTEC) 10 MG tablet TAKE 1 TABLET BY MOUTH EVERY DAY Patient taking differently: Take 10 mg by mouth daily. 08/17/20   Myrlene Broker, MD  DULoxetine (CYMBALTA) 60 MG capsule Take 60 mg by mouth daily. 11/13/17   [provider]  ergocalciferol (VITAMIN D2) 1.25 MG (50000 UT) capsule Take 50,000 Units by mouth every Sunday.    [provider]  Fluticasone-Salmeterol (ADVAIR) 100-50 MCG/DOSE AEPB Inhale 1 puff into the lungs 2 (two) times daily. 08/16/20   Mikhail, Nita Sells, DO  glucose blood [facility] VERIO) test strip USE AS INSTRUCTED TWICE A DAY 05/15/22   Myrlene Broker, MD  hydrOXYzine (  ATARAX/VISTARIL) 10 MG tablet Take 10 mg by mouth 3 (three) times daily as needed for itching.    [provider]  ibuprofen (ADVIL) 400 MG tablet Take 1 tablet (400 mg total) by mouth every 6 (six) hours as needed. 06/12/22   Myrlene Broker, MD  Insulin Pen Needle 31G X 5 MM MISC Use daily with insulin pen 07/15/18   Reather Littler, MD  JARDIANCE 25 MG TABS tablet Take 25 mg by mouth daily. 08/01/21   [provider]  levothyroxine (SYNTHROID) 175 MCG tablet Take 175 mcg by mouth daily. 05/26/21    [provider]  lubiprostone (AMITIZA) 24 MCG capsule TAKE 1 CAPSULE (24 MCG TOTAL) BY MOUTH 2 (TWO) TIMES DAILY WITH A MEAL. 07/10/22   Myrlene Broker, MD  metFORMIN (GLUCOPHAGE-XR) 500 MG 24 hr tablet Take 1,000 mg by mouth at bedtime.    [provider]  nystatin (MYCOSTATIN/NYSTOP) powder Apply 1 Application topically 3 (three) times daily. 06/12/22   Myrlene Broker, MD  nystatin-triamcinolone ointment [facility]) Apply 1 Application topically 2 (two) times daily. 07/05/22   Myrlene Broker, MD  ondansetron (ZOFRAN) 4 MG tablet Take 1 tablet (4 mg total) by mouth every 8 (eight) hours as needed for nausea or vomiting. 11/10/21   Myrlene Broker, MD  OneTouch Delica Lancets 33G MISC Use twice a day 05/04/21   Myrlene Broker, MD  oxyCODONE (ROXICODONE) 15 MG immediate release tablet Take 15 mg by mouth 4 (four) times daily as needed. 11/04/21   [provider]  pantoprazole (PROTONIX) 40 MG tablet TAKE 1 TABLET BY MOUTH EVERY DAY Patient taking differently: Take 40 mg by mouth daily before breakfast. 10/04/18   Adam Phenix, MD  predniSONE (DELTASONE) 2.5 MG tablet Take 3 tablets (7.5 mg total) by mouth daily. 01/25/22   Antony Madura, MD  promethazine (PHENERGAN) 12.5 MG tablet Take 1 tablet (12.5 mg total) by mouth every 8 (eight) hours as needed for nausea or vomiting. 11/29/20   Myrlene Broker, MD  pyridostigmine (MESTINON) 60 MG tablet Take 1 tablet (60 mg total) by mouth 4 (four) times daily. 01/25/22   Antony Madura, MD  QUEtiapine (SEROQUEL) 100 MG tablet Take 1 tablet (100 mg total) by mouth at bedtime. 08/30/21   Rhetta Mura, MD  tirzepatide [facility]) 2.5 MG/0.5ML Pen Inject 2.5 mg into the skin once a week. 05/24/22     tirzepatide (MOUNJARO) 2.5 MG/0.5ML Pen Inject 2.5 mg into the skin once a week. 05/30/22     tirzepatide (MOUNJARO) 5 MG/0.5ML Pen Inject 5 mg into the skin once a week. 06/23/22     tirzepatide (MOUNJARO)  7.5 MG/0.5ML Pen Inject 7.5 mg into the skin every 7 (seven) days. 06/26/22     topiramate (TOPAMAX) 200 MG tablet Take 200 mg by mouth daily in the afternoon.    [provider]  traZODone (DESYREL) 100 MG tablet Take 100 mg by mouth at bedtime. 10/12/21   [provider]  triamcinolone ointment (KENALOG) 0.5 % Apply 1 Application topically 2 (two) times daily. 07/05/22   Myrlene Broker, MD  valACYclovir (VALTREX) 500 MG tablet Take 1 tablet (500 mg total) by mouth 2 (two) times daily. Patient taking differently: Take 500 mg by mouth 2 (two) times daily as needed (as directed for breakouts). 04/27/20   Myrlene Broker, MD  zolmitriptan (ZOMIG) 5 MG tablet Take 1 tablet (5 mg total) by mouth as needed for migraine. 06/12/22  Myrlene Broker, MD      Allergies    Magnesium-containing compounds, Depo-provera [medroxyprogesterone], Other, and Vicodin [hydrocodone-acetaminophen]    Review of Systems   Review of Systems  All other systems reviewed and are negative.   Physical Exam Updated Vital Signs BP 99/64   Pulse 68   Temp 98.4 F (36.9 C)   Resp 16   Wt 91.6 kg   LMP 06/29/2022   SpO2 99%   BMI 32.60 kg/m  Physical Exam Vitals and nursing note reviewed.  Constitutional:      Appearance: She is well-developed.  HENT:     Head: Atraumatic.  Cardiovascular:     Rate and Rhythm: Normal rate.  Pulmonary:     Effort: Pulmonary effort is normal.  Abdominal:     General: There is distension.     Tenderness: There is abdominal tenderness. There is no guarding or rebound.     Comments: Positive fluid wave appreciated  Musculoskeletal:     Cervical back: Normal range of motion and neck supple.     Right lower leg: Edema present.     Left lower leg: Edema present.     Comments: 1+ pitting edema, right side worse than left  Skin:    General: Skin is warm and dry.  Neurological:     Mental Status: She is alert and oriented to person, place, and  time.     ED Results / Procedures / Treatments   Labs (all labs ordered are listed, but only abnormal results are displayed) Labs Reviewed  COMPREHENSIVE METABOLIC PANEL - Abnormal; Notable for the following components:      Result Value   Glucose, Bld 125 (*)    Calcium 8.7 (*)    All other components within normal limits  LACTIC ACID, PLASMA - Abnormal; Notable for the following components:   Lactic Acid, Venous 2.1 (*)    All other components within normal limits  CBC WITH DIFFERENTIAL/PLATELET  LIPASE, BLOOD  HCG, SERUM, QUALITATIVE  URINALYSIS, ROUTINE W REFLEX MICROSCOPIC  PREGNANCY, URINE  LACTIC ACID, PLASMA  BRAIN NATRIURETIC PEPTIDE    EKG None  Radiology CT ABDOMEN PELVIS W CONTRAST  Result Date: 07/20/2022 CLINICAL DATA:  Abdominal pain EXAM: CT ABDOMEN AND PELVIS WITH CONTRAST TECHNIQUE: Multidetector CT imaging of the abdomen and pelvis was performed using the standard protocol following bolus administration of intravenous contrast. RADIATION DOSE REDUCTION: This exam was performed according to the departmental dose-optimization program which includes automated exposure control, adjustment of the mA and/or kV according to patient size and/or use of iterative reconstruction technique. CONTRAST:  80mL OMNIPAQUE IOHEXOL 300 MG/ML  SOLN COMPARISON:  CT abdomen and pelvis dated May 31, 2021 FINDINGS: Lower chest: Trace bilateral pleural effusions. Hepatobiliary: No focal liver abnormality is seen. Status post cholecystectomy. No biliary dilatation. Pneumobilia again seen. Pancreas: Unremarkable. No pancreatic ductal dilatation or surrounding inflammatory changes. Spleen: Normal in size without focal abnormality. Adrenals/Urinary Tract: Bilateral adrenal glands are unremarkable. No hydronephrosis or nephrolithiasis. Bladder is unremarkable. Stomach/Bowel: Postsurgical changes of the stomach. Normal appendix. Evidence of obstruction, wall thickening or inflammatory change  Vascular/Lymphatic: No significant vascular findings are present. No enlarged abdominal or pelvic lymph nodes. Reproductive: Uterus and bilateral adnexa are unremarkable. Other: Complex fat containing ventral abdominal wall hernia, unchanged in appearance when compared with the prior exam. No abdominopelvic ascites. Musculoskeletal: No acute or significant osseous findings. IMPRESSION: 1. No acute findings in the abdomen or pelvis. 2. Trace bilateral pleural effusions. Electronically Signed  By: Allegra Lai M.D.   On: 07/20/2022 10:22    Procedures Procedures    Medications Ordered in ED Medications  pantoprazole (PROTONIX) EC tablet 40 mg (has no administration in time range)  simethicone (MYLICON) 40 mg/0.100ml suspension 40 mg (40 mg Oral Given 07/20/22 0842)  sodium chloride 0.9 % bolus 500 mL (500 mLs Intravenous New Bag/Given 07/20/22 0954)  iohexol (OMNIPAQUE) 300 MG/ML solution 100 mL (80 mLs Intravenous Contrast Given 07/20/22 0957)    ED Course/ Medical Decision Making/ A&P                             Medical Decision Making Amount and/or Complexity of Data Reviewed Labs: ordered. Radiology: ordered.  Risk OTC drugs. Prescription drug management.   This patient presents to the ED with chief complaint(s) of abdominal bloating, abdominal pain, lower extremity swelling with pertinent past medical history of Mounjaro use, with increased dose last week, bariatric surgery, PCOS and myasthenia gravis.The complaint involves an extensive differential diagnosis and also carries with it a high risk of complications and morbidity.    The differential diagnosis includes : Side effects from [facility] use which includes several GI related complaints including bloating, indigestion, nausea, abdominal pain, pancreatitis. Differential diagnosis also includes ileus, SBO, colitis.  I also considered allergic reaction to medication, however patient does not have any rash, respiratory  symptoms.  The initial plan is to get basic blood workup, lactic acid and lipase.  I will give patient medication for indigestion, if she continues to have her moderate to severe abdominal discomfort then we will need to proceed with CT scan.   Additional history obtained: Records reviewed  outpatient visits including bariatric surgeon visit, medications reviewed    Independent labs interpretation:  The following labs were independently interpreted: Patient has slightly elevated lactate of 2.1, overall reassuring.  CMP is normal.  Independent visualization and interpretation of imaging: - I independently visualized the following imaging with scope of interpretation limited to determining acute life threatening conditions related to emergency care: CT abdomen pelvis with contrast, which revealed no evidence of perforation  Treatment and Reassessment: Patient was noted to have 2 separate low blood pressure recording.  I assessed the patient, she indicated that she continues to have moderate pain.  Plan therefore was to proceed with CT scan.  Nursing staff made me aware that they adjusted the blood pressure cuff and the blood pressure has been normal again.  I still want her to get finer cc fluid bolus.  If the CT spine, blood pressure remained stable and normal, then we will DC the patient.  11:51 AM Patient reassessed.  Results of the CT discussed with her.  CT shows no acute abnormality.  No ascites noted by them.  I have informed patient that most likely she is having side effects from her medication, to talk to her prescribing doctor before she takes the next dose and we will manage her symptoms.  She will need to return to the ER if her symptoms get worse.  The patient appears reasonably screened and/or stabilized for discharge and I doubt any other medical condition or other [facility] requiring further screening, evaluation, or treatment in the ED at this time prior to discharge.    Results from the ER workup discussed with the patient face to face and all questions answered to the best of my ability. The patient is safe for discharge with strict return precautions.  Final Clinical Impression(s) / ED Diagnoses Final diagnoses:  Bloated abdomen  Pitting edema    Rx / DC Orders ED Discharge Orders          Ordered    furosemide (LASIX) 20 MG tablet  Daily        07/20/22 1139    simethicone (GAS-X) 80 MG chewable tablet  Every 6 hours PRN        07/20/22 1140    ondansetron (ZOFRAN-ODT) 8 MG disintegrating tablet  Every 8 hours PRN        07/20/22 1140              Derwood Kaplan, MD 07/20/22 1152

## 2022-07-20 NOTE — Discharge Instructions (Addendum)
You are seen in the emergency room for multiple complaints that appear to be side effects from the medications.  Take the medications that are prescribed for symptom relief.  CT scan is reassuring.  We recommend that you do not take additional Mounjaro until seen and cleared by your bariatric surgeon.  If you start having any difficulty breathing, dizziness, near fainting, shortness of breath, wheezing, rash please return to the ER immediately.

## 2022-07-21 ENCOUNTER — Other Ambulatory Visit (INDEPENDENT_AMBULATORY_CARE_PROVIDER_SITE_OTHER): Payer: 59

## 2022-07-21 ENCOUNTER — Ambulatory Visit (INDEPENDENT_AMBULATORY_CARE_PROVIDER_SITE_OTHER): Payer: 59 | Admitting: Nurse Practitioner

## 2022-07-21 ENCOUNTER — Encounter: Payer: Self-pay | Admitting: Nurse Practitioner

## 2022-07-21 VITALS — BP 98/66 | HR 85 | Temp 98.2°F | Wt 206.0 lb

## 2022-07-21 DIAGNOSIS — R609 Edema, unspecified: Secondary | ICD-10-CM | POA: Diagnosis not present

## 2022-07-21 LAB — COMPREHENSIVE METABOLIC PANEL
ALT: 17 U/L (ref 0–35)
AST: 18 U/L (ref 0–37)
Albumin: 3.5 g/dL (ref 3.5–5.2)
Alkaline Phosphatase: 61 U/L (ref 39–117)
BUN: 16 mg/dL (ref 6–23)
CO2: 29 mEq/L (ref 19–32)
Calcium: 8.8 mg/dL (ref 8.4–10.5)
Chloride: 101 mEq/L (ref 96–112)
Creatinine, Ser: 0.77 mg/dL (ref 0.40–1.20)
GFR: 91.87 mL/min (ref >60.00)
Glucose, Bld: 110 mg/dL — ABNORMAL HIGH (ref 70–99)
Potassium: 3.7 mEq/L (ref 3.5–5.1)
Sodium: 138 mEq/L (ref 135–145)
Total Bilirubin: 0.2 mg/dL (ref 0.2–1.2)
Total Protein: 7 g/dL (ref 6.0–8.3)

## 2022-07-21 LAB — BRAIN NATRIURETIC PEPTIDE: Pro B Natriuretic peptide (BNP): 9 pg/mL (ref 0.0–100.0)

## 2022-07-21 NOTE — Assessment & Plan Note (Addendum)
Acute Per patient slowly getting progressively better since ER visit yesterday Reports nearly 20 pound weight gain over the last 2 weeks Recommendation is to discontinue Mounjaro for now.  I do wonder if symptom onset may be coincidental versus allergic reaction to [facility].   I am concerned about possible heart failure.  Will order stat metabolic panel and BNP level. If kidney function and electrolytes are stable will have patient continue on furosemide 40 mg daily and follow-up with primary care provider next week for close monitoring.  Will also order outpatient echocardiogram for further evaluation. Patient's blood pressure is also on the low side today, she reports that blood pressure is frequently low.  Denies any dizziness, lightheadedness, shortness of breath.  We did discuss that blood pressure may worsen with continued use of furosemide and so if she experiences any dizziness, lightheadedness, chest pain, shortness of breath she needs to notify this office.  She reports her understanding.

## 2022-07-21 NOTE — Progress Notes (Signed)
Established Patient Office Visit  Subjective   Patient ID: Karmel Bowen, female    DOB: [birth year]  Age: 47 y.o. MRN: [id number]  Chief Complaint  Patient presents with   Foot Swelling    Knee pain and swollen stomach     47 year old female here for post emergency department visit.  She has a medical history of migraine, orthostatic hypotension, asthma, obstructive sleep apnea, type 2 diabetes, hypothyroidism, myasthenia gravis, anxiety and depression, fibromyalgia, obesity, status post bariatric surgery in 2019, cholecystectomy.  Main concern is swelling/discomfort in abdomen and bilateral extremities. Symptoms started after her dose of mounjaro was increased from 2.5mg /week to the 5mg /week.  Symptoms included abdominal pain and bloating, bilateral lower extremity swelling and pain, constipation.  She presented to the Emergency Department yesterday underwent evaluation which identified stable kidney function with GFR greater than 60 and creatinine of 0.61, normal CBC.  CT of abdomen identified no acute findings in abdomen but did identify trace pleural effusions.  Patient reports she has had a 20 pound weight increase over the last 2 weeks.  She was started on furosemide in the emergency department (40mg /day).   Reports that since yesterday she is having more regular bowel movements, and overall is feeling slightly better.  Still is experiencing swelling in lower extremities and bloating in her abdomen.  Denies orthopnea, however does report that she is not sleeping well and is staying up in her chair most of the time.      ROS: see HPI    Objective:     BP 98/66   Pulse 85   Temp 98.2 F (36.8 C) (Temporal)   Wt 206 lb (93.4 kg)   LMP 06/29/2022   SpO2 98%   BMI 33.25 kg/m  BP Readings from Last 3 Encounters:  07/21/22 98/66  07/20/22 124/86  06/30/22 120/80   Wt Readings from Last 3 Encounters:  07/21/22 206 lb (93.4 kg)  07/20/22 202 lb (91.6 kg)  06/30/22 187  lb (84.8 kg)      Physical Exam Vitals reviewed.  Constitutional:      General: She is not in acute distress.    Appearance: Normal appearance.  HENT:     Head: Normocephalic and atraumatic.  Neck:     Vascular: No carotid bruit.  Cardiovascular:     Rate and Rhythm: Normal rate and regular rhythm.     Pulses: Normal pulses.     Heart sounds: Normal heart sounds.  Pulmonary:     Effort: Pulmonary effort is normal.     Breath sounds: Normal breath sounds.  Musculoskeletal:     Right lower leg: 1+ Pitting Edema present.     Left lower leg: 1+ Pitting Edema present.  Skin:    General: Skin is warm and dry.  Neurological:     General: No focal deficit present.     Mental Status: She is alert and oriented to person, place, and time.  Psychiatric:        Mood and Affect: Mood normal.        Behavior: Behavior normal.        Judgment: Judgment normal.      No results found for any visits on 07/21/22.    The ASCVD Risk score (Arnett DK, et al., 2019) failed to calculate for the following reasons:   Cannot find a previous HDL lab   Cannot find a previous total cholesterol lab    Assessment & Plan:   Problem List Items Addressed  This Visit       Other   Swelling - Primary    Acute Per patient slowly getting progressively better since ER visit yesterday Reports nearly 20 pound weight gain over the last 2 weeks Recommendation is to discontinue Mounjaro for now.  I do wonder if symptom onset may be coincidental versus allergic reaction to [facility].   I am concerned about possible heart failure.  Will order stat metabolic panel and BNP level. If kidney function and electrolytes are stable will have patient continue on furosemide 40 mg daily and follow-up with primary care provider next week for close monitoring.  Will also order outpatient echocardiogram for further evaluation. Patient's blood pressure is also on the low side today, she reports that blood pressure is  frequently low.  Denies any dizziness, lightheadedness, shortness of breath.  We did discuss that blood pressure may worsen with continued use of furosemide and so if she experiences any dizziness, lightheadedness, chest pain, shortness of breath she needs to notify this office.  She reports her understanding.      Relevant Orders   Comprehensive metabolic panel   Brain natriuretic peptide   ECHOCARDIOGRAM COMPLETE    Return for as sched.    Elenore Paddy, NP

## 2022-07-22 ENCOUNTER — Other Ambulatory Visit (HOSPITAL_BASED_OUTPATIENT_CLINIC_OR_DEPARTMENT_OTHER): Payer: Self-pay

## 2022-07-22 ENCOUNTER — Other Ambulatory Visit: Payer: Self-pay | Admitting: Nurse Practitioner

## 2022-07-22 DIAGNOSIS — R609 Edema, unspecified: Secondary | ICD-10-CM

## 2022-07-22 MED ORDER — FUROSEMIDE 40 MG PO TABS
40.0000 mg | ORAL_TABLET | Freq: Every day | ORAL | 0 refills | Status: DC
Start: 2022-07-22 — End: 2022-07-27
  Filled 2022-07-22: qty 15, 15d supply, fill #0

## 2022-07-24 ENCOUNTER — Other Ambulatory Visit (HOSPITAL_BASED_OUTPATIENT_CLINIC_OR_DEPARTMENT_OTHER): Payer: Self-pay

## 2022-07-24 DIAGNOSIS — Z903 Acquired absence of stomach [part of]: Secondary | ICD-10-CM | POA: Diagnosis not present

## 2022-07-24 DIAGNOSIS — E119 Type 2 diabetes mellitus without complications: Secondary | ICD-10-CM | POA: Diagnosis not present

## 2022-07-24 MED ORDER — MOUNJARO 5 MG/0.5ML ~~LOC~~ SOAJ
5.0000 mg | SUBCUTANEOUS | 0 refills | Status: DC
  Filled 2022-07-24: qty 2, 28d supply, fill #0

## 2022-07-24 MED ORDER — MOUNJARO 7.5 MG/0.5ML ~~LOC~~ SOAJ
7.5000 mg | SUBCUTANEOUS | 0 refills | Status: DC
  Filled 2022-07-25: qty 2, 28d supply, fill #0

## 2022-07-25 ENCOUNTER — Other Ambulatory Visit: Payer: Self-pay

## 2022-07-25 ENCOUNTER — Other Ambulatory Visit (HOSPITAL_BASED_OUTPATIENT_CLINIC_OR_DEPARTMENT_OTHER): Payer: Self-pay

## 2022-07-27 ENCOUNTER — Encounter: Payer: Self-pay | Admitting: Internal Medicine

## 2022-07-27 ENCOUNTER — Ambulatory Visit (INDEPENDENT_AMBULATORY_CARE_PROVIDER_SITE_OTHER): Payer: 59 | Admitting: Internal Medicine

## 2022-07-27 VITALS — BP 100/80 | HR 72 | Temp 98.0°F | Ht 66.0 in | Wt 199.0 lb

## 2022-07-27 DIAGNOSIS — E1169 Type 2 diabetes mellitus with other specified complication: Secondary | ICD-10-CM

## 2022-07-27 DIAGNOSIS — R609 Edema, unspecified: Secondary | ICD-10-CM

## 2022-07-27 DIAGNOSIS — E669 Obesity, unspecified: Secondary | ICD-10-CM | POA: Diagnosis not present

## 2022-07-27 NOTE — Assessment & Plan Note (Signed)
She states today that she is taking some insulin (maybe lantus) dose unknown. Seeing endo and we do not have notes. Asked patient again to bring accurate and whole medication list to every visit as we cannot take good care of her if we do not know accurately what she is taking.

## 2022-07-27 NOTE — Patient Instructions (Signed)
We will have you stop the fluid pills.   We have given you the prescription for compression stockings to use if you get swelling to return.   We will do the ultrasound of the heart to check.  Try to lower salt in your diet to help and drink plenty of water.

## 2022-07-27 NOTE — Assessment & Plan Note (Signed)
She did have weight gain of about 10 pounds recently. With noted pitting edema in the legs. I suspect some weight gain was related to gaseous distention and constipation. 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 Suspect this combination causes GI motility issues, gaseous distention. Advised to stay off mounjaro. I do not recommend she stay on a fluid pill and have removed this from her list. She already has polypharmacy and we do not have an accurate medication list for her. CMP and BNP negative for secondary causes of edema. Counseled to lower sodium in her diet and we will see results of ECHO which was ordered. She was advised that her chronic prednisone can cause swelling and may be contributing. This may be recurrent but I recommend compression stockings and given rx today for this.
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
## 2022-07-27 NOTE — Progress Notes (Signed)
   Subjective:   Patient ID: Jade Mathis, female    DOB: 02/20/1976, 47 y.o.   MRN: [id number]  HPI The patient is a 47 YO female coming in for follow up ER and weight gain. Per our records she was 190 pounds in March and recent ER visit was 201 pounds. Patient states 20 pound weight gain although this does not seem accurate. Lowest weight recently per patient was 187 pounds at sports medicine. Weight today 199 pounds (down 2 pound from ER scales). She was having abdominal distention (CT scan done without fluid, was given gas-x with relief and stopped mounjaro). She did have swelling in legs (has had in past but not chronic). Now that stomach is down some the breathing is back to her baseline.   Review of Systems  Constitutional:  Positive for activity change and fatigue.  HENT: Negative.    Eyes: Negative.   Respiratory:  Negative for cough, chest tightness and shortness of breath.   Cardiovascular:  Negative for chest pain, palpitations and leg swelling.  Gastrointestinal:  Positive for abdominal pain and constipation. Negative for abdominal distention, diarrhea, nausea and vomiting.  Musculoskeletal:  Positive for arthralgias, back pain and myalgias.  Skin: Negative.   Neurological: Negative.   Psychiatric/Behavioral: Negative.      Objective:  Physical Exam Constitutional:      Appearance: She is well-developed.     Comments: Chronically ill appearing  HENT:     Head: Normocephalic and atraumatic.  Cardiovascular:     Rate and Rhythm: Normal rate and regular rhythm.  Pulmonary:     Effort: Pulmonary effort is normal. No respiratory distress.     Breath sounds: Normal breath sounds. No wheezing or rales.  Abdominal:     General: Bowel sounds are normal. There is no distension.     Palpations: Abdomen is soft.     Tenderness: There is no abdominal tenderness. There is no rebound.     Comments: No clear distention and no fluid wave present  Musculoskeletal:         General: Tenderness present.     Cervical back: Normal range of motion.  Skin:    General: Skin is warm and dry.  Neurological:     Mental Status: She is alert and oriented to person, place, and time.     Coordination: Coordination abnormal.     Vitals:   07/27/22 0839 07/27/22 0843  BP: 100/80   Pulse: 62 72  Temp: 98 F (36.7 C)   TempSrc: Oral   SpO2: (!) 74% (!) 89%  Weight: 199 lb (90.3 kg)   Height: 5\' 6"  (1.676 m)     Assessment & Plan:  Visit time 15 minutes in face to face communication with patient and coordination of care, additional 15 minutes spent in record review, coordination or care, ordering tests, communicating/referring to other healthcare professionals, documenting in medical records all on the same day of the visit for total time 30 minutes spent on the visit.

## 2022-08-03 ENCOUNTER — Telehealth: Payer: Self-pay | Admitting: Internal Medicine

## 2022-08-03 ENCOUNTER — Ambulatory Visit (INDEPENDENT_AMBULATORY_CARE_PROVIDER_SITE_OTHER): Payer: 59 | Admitting: Neurology

## 2022-08-03 ENCOUNTER — Encounter: Payer: Self-pay | Admitting: Neurology

## 2022-08-03 VITALS — BP 97/56 | HR 88 | Ht 66.0 in | Wt 140.0 lb

## 2022-08-03 DIAGNOSIS — G7000 Myasthenia gravis without (acute) exacerbation: Secondary | ICD-10-CM

## 2022-08-03 DIAGNOSIS — Z5181 Encounter for therapeutic drug level monitoring: Secondary | ICD-10-CM

## 2022-08-03 DIAGNOSIS — R471 Dysarthria and anarthria: Secondary | ICD-10-CM

## 2022-08-03 DIAGNOSIS — R1310 Dysphagia, unspecified: Secondary | ICD-10-CM

## 2022-08-03 DIAGNOSIS — H02403 Unspecified ptosis of bilateral eyelids: Secondary | ICD-10-CM

## 2022-08-03 DIAGNOSIS — R531 Weakness: Secondary | ICD-10-CM | POA: Diagnosis not present

## 2022-08-03 MED ORDER — PREDNISONE 2.5 MG PO TABS
5.0000 mg | ORAL_TABLET | Freq: Every day | ORAL | 3 refills | Status: DC
Start: 2022-08-03 — End: 2023-10-03

## 2022-08-03 MED ORDER — PYRIDOSTIGMINE BROMIDE 60 MG PO TABS: 60.0000 mg | ORAL_TABLET | Freq: Four times a day (QID) | ORAL | 11 refills | Status: DC

## 2022-08-03 MED ORDER — AZATHIOPRINE 50 MG PO TABS
150.0000 mg | ORAL_TABLET | Freq: Every morning | ORAL | 3 refills | Status: DC
Start: 2022-08-03 — End: 2023-10-03

## 2022-08-03 NOTE — Telephone Encounter (Signed)
Patient said she is getting a tummy tuck procedure done in July and her surgeon requested a more recent A1C. She would like to know if Dr. Okey Dupre can put lab orders in for her to get that done. Best callback is [phone].

## 2022-08-03 NOTE — Patient Instructions (Addendum)
Myasthenia gravis medications: -Prednisone reduce to 5 mg daily (2 tablets). If new or worsening symptoms, go back to 7.5 mg (3 tablets) and give [place] a call -Azathioprine 150 mg daily -Mestinon 60 mg 4 times daily -Continue home PT exercises  Return to clinic in 6 months or sooner if needed.  The physicians and staff at [facility] are committed to providing excellent care. You may receive a survey requesting feedback about your experience at our office. We strive to receive "very good" responses to the survey questions. If you feel that your experience would prevent you from giving the office a "very good " response, please contact our office to try to remedy the situation. We may be reached at [phone]. Thank you for taking the time out of your busy day to complete the survey.  Jacquelyne Balint, MD [facility]

## 2022-08-04 ENCOUNTER — Other Ambulatory Visit (HOSPITAL_BASED_OUTPATIENT_CLINIC_OR_DEPARTMENT_OTHER): Payer: Self-pay

## 2022-08-04 NOTE — Telephone Encounter (Signed)
Patient called back and said she was told she needed her A1C checked in June since her surgery is in July. They wanted an updated one closer to the surgery. Best callback is [phone].

## 2022-08-04 NOTE — Telephone Encounter (Signed)
It was 5.8 on 05/24/22 through bariatics so not due. They can get that record from bariatrics

## 2022-08-04 NOTE — Telephone Encounter (Signed)
Still inform them to get the records from bariatrics

## 2022-08-09 NOTE — Telephone Encounter (Signed)
Patient has been scheduled for an A1C

## 2022-08-09 NOTE — Telephone Encounter (Signed)
Recommend visit at least 08/24/22 or later we can check at visit she should have follow up of recent swelling episode around then.

## 2022-08-11 ENCOUNTER — Encounter (HOSPITAL_BASED_OUTPATIENT_CLINIC_OR_DEPARTMENT_OTHER): Payer: Self-pay | Admitting: Emergency Medicine

## 2022-08-11 ENCOUNTER — Emergency Department (HOSPITAL_BASED_OUTPATIENT_CLINIC_OR_DEPARTMENT_OTHER): Payer: 59 | Admitting: Radiology

## 2022-08-11 ENCOUNTER — Emergency Department (HOSPITAL_BASED_OUTPATIENT_CLINIC_OR_DEPARTMENT_OTHER)
Admission: EM | Admit: 2022-08-11 | Discharge: 2022-08-11 | Disposition: A | Discharge status: Home / Self Care | Payer: 59 | Attending: Emergency Medicine | Admitting: Emergency Medicine

## 2022-08-11 ENCOUNTER — Other Ambulatory Visit: Payer: Self-pay

## 2022-08-11 DIAGNOSIS — R0602 Shortness of breath: Secondary | ICD-10-CM | POA: Insufficient documentation

## 2022-08-11 DIAGNOSIS — R140 Abdominal distension (gaseous): Secondary | ICD-10-CM | POA: Diagnosis not present

## 2022-08-11 DIAGNOSIS — R635 Abnormal weight gain: Secondary | ICD-10-CM

## 2022-08-11 DIAGNOSIS — E039 Hypothyroidism, unspecified: Secondary | ICD-10-CM | POA: Insufficient documentation

## 2022-08-11 DIAGNOSIS — Z7951 Long term (current) use of inhaled steroids: Secondary | ICD-10-CM | POA: Insufficient documentation

## 2022-08-11 DIAGNOSIS — Z79899 Other long term (current) drug therapy: Secondary | ICD-10-CM | POA: Insufficient documentation

## 2022-08-11 DIAGNOSIS — R600 Localized edema: Secondary | ICD-10-CM | POA: Insufficient documentation

## 2022-08-11 DIAGNOSIS — E119 Type 2 diabetes mellitus without complications: Secondary | ICD-10-CM | POA: Diagnosis not present

## 2022-08-11 DIAGNOSIS — Z794 Long term (current) use of insulin: Secondary | ICD-10-CM | POA: Diagnosis not present

## 2022-08-11 DIAGNOSIS — J9811 Atelectasis: Secondary | ICD-10-CM | POA: Diagnosis not present

## 2022-08-11 DIAGNOSIS — J45909 Unspecified asthma, uncomplicated: Secondary | ICD-10-CM | POA: Diagnosis not present

## 2022-08-11 DIAGNOSIS — E1140 Type 2 diabetes mellitus with diabetic neuropathy, unspecified: Secondary | ICD-10-CM | POA: Diagnosis not present

## 2022-08-11 DIAGNOSIS — Z7984 Long term (current) use of oral hypoglycemic drugs: Secondary | ICD-10-CM | POA: Insufficient documentation

## 2022-08-11 LAB — CBC
HCT: 35.5 % — ABNORMAL LOW (ref 36.0–46.0)
Hemoglobin: 10.9 g/dL — ABNORMAL LOW (ref 12.0–15.0)
MCH: 26.3 pg (ref 26.0–34.0)
MCHC: 30.7 g/dL (ref 30.0–36.0)
MCV: 85.7 fL (ref 80.0–100.0)
Platelets: 242 10*3/uL (ref 150–400)
RBC: 4.14 MIL/uL (ref 3.87–5.11)
RDW: 14.5 % (ref 11.5–15.5)
WBC: 6.2 10*3/uL (ref 4.0–10.5)
nRBC: 0 % (ref 0.0–0.2)

## 2022-08-11 LAB — BRAIN NATRIURETIC PEPTIDE: B Natriuretic Peptide: 28.5 pg/mL (ref 0.0–100.0)

## 2022-08-11 LAB — BASIC METABOLIC PANEL
Anion gap: 5 (ref 5–15)
BUN: 15 mg/dL (ref 6–20)
CO2: 31 mmol/L (ref 22–32)
Calcium: 8.8 mg/dL — ABNORMAL LOW (ref 8.9–10.3)
Chloride: 102 mmol/L (ref 98–111)
Creatinine, Ser: 0.85 mg/dL (ref 0.44–1.00)
GFR, Estimated: >60 mL/min (ref >60)
Glucose, Bld: 98 mg/dL (ref 70–99)
Potassium: 3.3 mmol/L — ABNORMAL LOW (ref 3.5–5.1)
Sodium: 138 mmol/L (ref 135–145)

## 2022-08-11 NOTE — ED Notes (Signed)
Pt given some chips and water per PA approval.

## 2022-08-11 NOTE — ED Provider Notes (Signed)
[place] EMERGENCY DEPARTMENT AT [facility] Provider Note   CSN: [id number] Arrival date & time: 08/11/22  1801     History  Chief Complaint  Patient presents with   Jade Mathis is a 47 y.o. female.  Patient presents the emergency department complaining of weight gain and bloating.  Patient states that over the past 4 days she believes she has gained as many as 20 pounds.  She states this all started after eating at a [employer].  She had a similar issue with weight gain/bloating approximately 1 month ago which was treated with subsequent Lasix.  She states she had eaten at the same [place] prior to onset of symptoms that time.  She endorses mild shortness of breath, states it feels that there is too much fluid gets somewhat hard to catch a deep breath.  She states is better when she sits up.  She denies chest pain, abdominal pain, nausea, vomiting, urinary symptoms, headache.  The patient recently stopped Mounjaro at the recommendations of her primary care team.  Her primary care also recommended she stop taking Lasix due to issues with polypharmacy.  Past medical history significant for type II DM, obesity, hypothyroidism, myasthenia gravis, asthma, GERD, fibromyalgia, orthostatic hypotension, meningocele, drug-induced constipation, opioid dependence  HPI     Home Medications Prior to Admission medications   Medication Sig Start Date End Date Taking? Authorizing Provider  acetaminophen (TYLENOL) 325 MG tablet Take 2 tablets (650 mg total) by mouth every 6 (six) hours as needed. 11/04/21   Gareth Eagle, PA-C  albuterol (VENTOLIN HFA) 108 (90 Base) MCG/ACT inhaler TAKE 2 PUFFS BY MOUTH EVERY 6 HOURS AS NEEDED FOR WHEEZE OR SHORTNESS OF BREATH 12/28/21   Myrlene Broker, MD  Atogepant (QULIPTA) 60 MG TABS Take 60 mg by mouth daily.    [provider]  azaTHIOprine (IMURAN) 50 MG tablet Take 3 tablets (150 mg total) by  mouth in the morning. 08/03/22   Antony Madura, MD  b complex vitamins tablet Take 1 tablet by mouth daily.    [provider]  Blood Glucose Monitoring Suppl (ACCU-CHEK AVIVA PLUS) w/Device KIT Use to test blood sugar daily 01/24/19   Myrlene Broker, MD  cetirizine (ZYRTEC) 10 MG tablet TAKE 1 TABLET BY MOUTH EVERY DAY Patient taking differently: Take 10 mg by mouth daily. 08/17/20   Myrlene Broker, MD  DULoxetine (CYMBALTA) 60 MG capsule Take 60 mg by mouth daily. 11/13/17   [provider]  ergocalciferol (VITAMIN D2) 1.25 MG (50000 UT) capsule Take 50,000 Units by mouth every Sunday. Patient not taking: Reported on 08/03/2022    [provider]  Fluticasone-Salmeterol (ADVAIR) 100-50 MCG/DOSE AEPB Inhale 1 puff into the lungs 2 (two) times daily. Patient not taking: Reported on 08/03/2022 08/16/20   Edsel Petrin, DO  glucose blood [facility] VERIO) test strip USE AS INSTRUCTED TWICE A DAY 05/15/22   Myrlene Broker, MD  hydrOXYzine (ATARAX/VISTARIL) 10 MG tablet Take 10 mg by mouth 3 (three) times daily as needed for itching.    [provider]  ibuprofen (ADVIL) 400 MG tablet Take 1 tablet (400 mg total) by mouth every 6 (six) hours as needed. 06/12/22   Myrlene Broker, MD  Insulin Pen Needle 31G X 5 MM MISC Use daily with insulin pen 07/15/18   Reather Littler, MD  JARDIANCE 25 MG TABS tablet Take 25 mg by mouth daily. 08/01/21   [provider]  levothyroxine (SYNTHROID) 175 MCG tablet Take 150 mcg by mouth daily. 05/26/21   [provider]  lubiprostone (AMITIZA) 24 MCG capsule TAKE 1 CAPSULE (24 MCG TOTAL) BY MOUTH 2 (TWO) TIMES DAILY WITH A MEAL. 07/10/22   Myrlene Broker, MD  metFORMIN (GLUCOPHAGE-XR) 500 MG 24 hr tablet Take 1,000 mg by mouth at bedtime.    [provider]  naloxone [facility]) nasal spray 4 mg/0.1 mL SMARTSIG:Both Nares 07/19/22   [provider]  nystatin (MYCOSTATIN/NYSTOP) powder  Apply 1 Application topically 3 (three) times daily. Patient not taking: Reported on 08/03/2022 06/12/22   Myrlene Broker, MD  nystatin-triamcinolone ointment [facility]) Apply 1 Application topically 2 (two) times daily. 07/05/22   Myrlene Broker, MD  ondansetron (ZOFRAN) 4 MG tablet Take 1 tablet (4 mg total) by mouth every 8 (eight) hours as needed for nausea or vomiting. 11/10/21   Myrlene Broker, MD  ondansetron (ZOFRAN-ODT) 8 MG disintegrating tablet Take 1 tablet (8 mg total) by mouth every 8 (eight) hours as needed for nausea. 07/20/22   Derwood Kaplan, MD  OneTouch Delica Lancets 33G MISC Use twice a day 05/04/21   Myrlene Broker, MD  oxyCODONE (ROXICODONE) 15 MG immediate release tablet Take 15 mg by mouth 4 (four) times daily as needed. 11/04/21   [provider]  pantoprazole (PROTONIX) 40 MG tablet TAKE 1 TABLET BY MOUTH EVERY DAY Patient taking differently: Take 40 mg by mouth daily before breakfast. 10/04/18   Adam Phenix, MD  predniSONE (DELTASONE) 2.5 MG tablet Take 2 tablets (5 mg total) by mouth daily. 08/03/22   Antony Madura, MD  promethazine (PHENERGAN) 12.5 MG tablet Take 1 tablet (12.5 mg total) by mouth every 8 (eight) hours as needed for nausea or vomiting. Patient not taking: Reported on 08/03/2022 11/29/20   Myrlene Broker, MD  pyridostigmine (MESTINON) 60 MG tablet Take 1 tablet (60 mg total) by mouth 4 (four) times daily. 08/03/22   Antony Madura, MD  QUEtiapine (SEROQUEL) 100 MG tablet Take 1 tablet (100 mg total) by mouth at bedtime. 08/30/21   Rhetta Mura, MD  simethicone (GAS-X) 80 MG chewable tablet Chew 1 tablet (80 mg total) by mouth every 6 (six) hours as needed for flatulence. 07/20/22   Derwood Kaplan, MD  tirzepatide [facility]) 5 MG/0.5ML Pen Inject 5 mg into the skin once a week. Patient not taking: Reported on 08/03/2022 07/24/22     tirzepatide (MOUNJARO) 7.5 MG/0.5ML Pen Inject 7.5 mg into the skin once a week.  07/24/22     topiramate (TOPAMAX) 200 MG tablet Take 200 mg by mouth daily in the afternoon.    [provider]  traZODone (DESYREL) 100 MG tablet Take 100 mg by mouth at bedtime. 10/12/21   [provider]  triamcinolone ointment (KENALOG) 0.5 % Apply 1 Application topically 2 (two) times daily. 07/05/22   Myrlene Broker, MD  valACYclovir (VALTREX) 500 MG tablet Take 1 tablet (500 mg total) by mouth 2 (two) times daily. Patient taking differently: Take 500 mg by mouth 2 (two) times daily as needed (as directed for breakouts). 04/27/20   Myrlene Broker, MD  zolmitriptan (ZOMIG) 5 MG tablet Take 1 tablet (5 mg total) by mouth as needed for migraine. 06/12/22   Myrlene Broker, MD      Allergies    Magnesium-containing compounds, Depo-provera [medroxyprogesterone], Other, and Vicodin [hydrocodone-acetaminophen]    Review of Systems   Review of Systems  Physical Exam Updated Vital Signs BP (!) 93/59   Pulse 65   Temp 98 F (36.7 C) (Oral)   Resp 18   Ht 5\' 6"  (1.676 m)   Wt 98.9 kg   LMP 06/29/2022   SpO2 99%   BMI 35.19 kg/m  Physical Exam Vitals and nursing note reviewed.  Constitutional:      General: She is not in acute distress.    Appearance: She is well-developed. She is obese.  HENT:     Head: Normocephalic and atraumatic.     Mouth/Throat:     Mouth: Mucous membranes are moist.  Eyes:     Conjunctiva/sclera: Conjunctivae normal.  Cardiovascular:     Rate and Rhythm: Normal rate and regular rhythm.  Pulmonary:     Effort: Pulmonary effort is normal. No respiratory distress.     Breath sounds: Normal breath sounds. No wheezing, rhonchi or rales.  Abdominal:     Palpations: Abdomen is soft.     Tenderness: There is no abdominal tenderness.  Musculoskeletal:        General: No swelling.     Cervical back: Neck supple.     Right lower leg: Edema present.     Left lower leg: Edema present.     Comments: Mild edema noted to bilateral  lower extremities  Skin:    General: Skin is warm and dry.     Capillary Refill: Capillary refill takes less than 2 seconds.  Neurological:     Mental Status: She is alert.  Psychiatric:        Mood and Affect: Mood normal.     ED Results / Procedures / Treatments   Labs (all labs ordered are listed, but only abnormal results are displayed) Labs Reviewed  BASIC METABOLIC PANEL - Abnormal; Notable for the following components:      Result Value   Potassium 3.3 (*)    Calcium 8.8 (*)    All other components within normal limits  CBC - Abnormal; Notable for the following components:   Hemoglobin 10.9 (*)    HCT 35.5 (*)    All other components within normal limits  BRAIN NATRIURETIC PEPTIDE    EKG None  Radiology DG Chest 2 View  Result Date: 08/11/2022 CLINICAL DATA:  Weight gain and bloating over the past 4 days. EXAM: CHEST - 2 VIEW COMPARISON:  Chest x-ray dated November 11, 2021. FINDINGS: The heart size and mediastinal contours are within normal limits. Postsurgical clips in the upper anterior mediastinum again noted. Low lung volumes with normal pulmonary vascularity. Mild bibasilar atelectasis. No focal consolidation, pleural effusion, or pneumothorax. No acute osseous abnormality. IMPRESSION: 1. Low lung volumes with mild bibasilar atelectasis. Electronically Signed   By: Obie Dredge M.D.   On: 08/11/2022 18:46    Procedures Procedures    Medications Ordered in ED Medications - No data to display  ED Course/ Medical Decision Making/ A&P                             Medical Decision Making Amount and/or Complexity of Data Reviewed Labs: ordered. Radiology: ordered.   This patient presents to the ED for concern of weight gain, this involves an extensive number of treatment options, and is a complaint that carries with it a high risk of complications and morbidity.  The differential diagnosis includes fluid overload, constipation, others   Co morbidities  that complicate the patient evaluation  History  of fluid overload, type II DM, opioid-induced constipation   Additional history obtained:   External records from outside source obtained and reviewed including primary care notes documenting recommendation to stop Lasix   Lab Tests:  I Ordered, and personally interpreted labs.  The pertinent results include: Mild hypokalemia with potassium 3.3, hemoglobin 10.9, BNP 28.5   Imaging Studies ordered:  I ordered imaging studies including chest x-ray I independently visualized and interpreted imaging which showed low lung volumes with mild bibasilar atelectasis I agree with the radiologist interpretation    Test / Admission - Considered:  Patient with no acute findings on workup.  She does have mild edema in the lower extremities but her blood pressures are on the softer side and there is no indication at this time for Lasix.  Unclear cause of patient's weight gain.  The patient clinically does not appear to be fluid overloaded with lungs clear to auscultation and mild edema in the lower extremities.  Patient to follow-up with primary care for further evaluation and management         Final Clinical Impression(s) / ED Diagnoses Final diagnoses:  Weight gain    Rx / DC Orders ED Discharge Orders     None         Pamala Duffel 08/11/22 1941    Virgina Norfolk, DO 08/11/22 2114

## 2022-08-11 NOTE — ED Notes (Signed)
Reviewed AVS/discharge instruction with patient. Time allotted for and all questions answered. Patient is agreeable for d/c and escorted to ed exit by staff.  

## 2022-08-11 NOTE — Discharge Instructions (Signed)
You were evaluated today for unexpected weight gain.  Your workup was reassuring with no signs of significant fluid overload at this time.  Please follow-up with your primary care provider for further evaluation and management

## 2022-08-11 NOTE — ED Triage Notes (Signed)
Pt arrives to ED with c/o weight gain and bloating since eating at a [employer] x4 days.

## 2022-08-14 DIAGNOSIS — Z79899 Other long term (current) drug therapy: Secondary | ICD-10-CM | POA: Diagnosis not present

## 2022-08-14 DIAGNOSIS — E119 Type 2 diabetes mellitus without complications: Secondary | ICD-10-CM | POA: Diagnosis not present

## 2022-08-14 DIAGNOSIS — E7800 Pure hypercholesterolemia, unspecified: Secondary | ICD-10-CM | POA: Diagnosis not present

## 2022-08-14 DIAGNOSIS — M129 Arthropathy, unspecified: Secondary | ICD-10-CM | POA: Diagnosis not present

## 2022-08-14 DIAGNOSIS — R0602 Shortness of breath: Secondary | ICD-10-CM | POA: Diagnosis not present

## 2022-08-14 DIAGNOSIS — Z0000 Encounter for general adult medical examination without abnormal findings: Secondary | ICD-10-CM | POA: Diagnosis not present

## 2022-08-14 DIAGNOSIS — I10 Essential (primary) hypertension: Secondary | ICD-10-CM | POA: Diagnosis not present

## 2022-08-14 DIAGNOSIS — D539 Nutritional anemia, unspecified: Secondary | ICD-10-CM | POA: Diagnosis not present

## 2022-08-14 DIAGNOSIS — E039 Hypothyroidism, unspecified: Secondary | ICD-10-CM | POA: Diagnosis not present

## 2022-08-14 DIAGNOSIS — E559 Vitamin D deficiency, unspecified: Secondary | ICD-10-CM | POA: Diagnosis not present

## 2022-08-16 ENCOUNTER — Other Ambulatory Visit (HOSPITAL_BASED_OUTPATIENT_CLINIC_OR_DEPARTMENT_OTHER): Payer: Self-pay

## 2022-08-17 DIAGNOSIS — Z79899 Other long term (current) drug therapy: Secondary | ICD-10-CM | POA: Diagnosis not present

## 2022-08-18 DIAGNOSIS — Z79899 Other long term (current) drug therapy: Secondary | ICD-10-CM | POA: Diagnosis not present

## 2022-08-18 DIAGNOSIS — M542 Cervicalgia: Secondary | ICD-10-CM | POA: Diagnosis not present

## 2022-08-18 DIAGNOSIS — G8929 Other chronic pain: Secondary | ICD-10-CM | POA: Diagnosis not present

## 2022-08-18 DIAGNOSIS — M545 Low back pain, unspecified: Secondary | ICD-10-CM | POA: Diagnosis not present

## 2022-08-22 ENCOUNTER — Other Ambulatory Visit (HOSPITAL_BASED_OUTPATIENT_CLINIC_OR_DEPARTMENT_OTHER): Payer: Self-pay

## 2022-08-22 MED ORDER — LISDEXAMFETAMINE DIMESYLATE 40 MG PO CAPS
40.0000 mg | ORAL_CAPSULE | Freq: Every day | ORAL | 0 refills | Status: DC
  Filled 2022-08-22: qty 30, 30d supply, fill #0

## 2022-08-24 ENCOUNTER — Ambulatory Visit (HOSPITAL_COMMUNITY): Payer: 59 | Attending: Nurse Practitioner

## 2022-08-24 DIAGNOSIS — R609 Edema, unspecified: Secondary | ICD-10-CM | POA: Diagnosis not present

## 2022-08-24 DIAGNOSIS — R600 Localized edema: Secondary | ICD-10-CM

## 2022-08-24 NOTE — Progress Notes (Unsigned)
Patient did not consent to the administration of Definity. 

## 2022-08-25 ENCOUNTER — Other Ambulatory Visit: Payer: Self-pay

## 2022-08-25 LAB — ECHOCARDIOGRAM COMPLETE
Area-P 1/2: 3.01 cm2
S' Lateral: 2.4 cm

## 2022-08-29 ENCOUNTER — Other Ambulatory Visit (HOSPITAL_BASED_OUTPATIENT_CLINIC_OR_DEPARTMENT_OTHER): Payer: Self-pay

## 2022-08-30 ENCOUNTER — Encounter: Payer: Self-pay | Admitting: Pharmacist

## 2022-08-30 NOTE — Progress Notes (Signed)
[facility]) [facility] Quality Pharmacy Team Statin Quality Measure Assessment  08/30/2022  Jade Mathis 06/06/1975 [id number]  Per review of chart and payor information, Ms. Younts has a diagnosis of diabetes but is not currently filling a statin prescription.  This places patient into the Statin Use In Patients with Diabetes (SUPD) measure for CMS. Patient has been prescribed atorvastatin in the past but has not filled since November 2023.  If clinically appropriate, please discuss statin compliance at tomorrow's office visit.    Please consider ONE of the following recommendations:  Initiate high intensity statin Atorvastatin 40 mg once daily, #90, 3 refills   Rosuvastatin 20 mg once daily, #90, 3 refills    Initiate moderate intensity          statin with reduced frequency if prior          statin intolerance 1x weekly, #13, 3 refills   2x weekly, #26, 3 refills   3x weekly, #39, 3 refills    Code for past statin intolerance or  other exclusions (required annually)  Provider Requirements: Associate code during an office visit or telehealth encounter  Drug Induced Myopathy G72.0   Myopathy, unspecified G72.9   Myositis, unspecified M60.9   Rhabdomyolysis M62.82   Cirrhosis of liver K74.69   Prediabetes R73.03   PCOS E28.2   Thank you for allowing [facility] pharmacy to be a part of this patient's care.  Dellie Burns, PharmD [facility]  [facility] Clinical Pharmacist Office: [phone]

## 2022-08-31 ENCOUNTER — Encounter: Payer: Self-pay | Admitting: Internal Medicine

## 2022-08-31 ENCOUNTER — Ambulatory Visit (INDEPENDENT_AMBULATORY_CARE_PROVIDER_SITE_OTHER): Payer: 59 | Admitting: Internal Medicine

## 2022-08-31 VITALS — BP 140/100 | HR 72 | Temp 97.9°F | Ht 66.0 in | Wt 203.0 lb

## 2022-08-31 DIAGNOSIS — R609 Edema, unspecified: Secondary | ICD-10-CM | POA: Diagnosis not present

## 2022-08-31 DIAGNOSIS — E1169 Type 2 diabetes mellitus with other specified complication: Secondary | ICD-10-CM | POA: Diagnosis not present

## 2022-08-31 DIAGNOSIS — K5903 Drug induced constipation: Secondary | ICD-10-CM | POA: Diagnosis not present

## 2022-08-31 DIAGNOSIS — E785 Hyperlipidemia, unspecified: Secondary | ICD-10-CM

## 2022-08-31 DIAGNOSIS — Z6832 Body mass index (BMI) 32.0-32.9, adult: Secondary | ICD-10-CM

## 2022-08-31 DIAGNOSIS — E669 Obesity, unspecified: Secondary | ICD-10-CM

## 2022-08-31 DIAGNOSIS — E6609 Other obesity due to excess calories: Secondary | ICD-10-CM

## 2022-08-31 DIAGNOSIS — Z7985 Long-term (current) use of injectable non-insulin antidiabetic drugs: Secondary | ICD-10-CM

## 2022-08-31 LAB — POCT GLYCOSYLATED HEMOGLOBIN (HGB A1C): HbA1c POC (<> result, manual entry): 5.6 % (ref 4.0–5.6)

## 2022-08-31 NOTE — Patient Instructions (Signed)
The HgA1c is 5.6. We will check the labs today.

## 2022-08-31 NOTE — Progress Notes (Signed)
   Subjective:   Patient ID: Jade Mathis, female    DOB: 04-18-1975, 47 y.o.   MRN: [id number]  HPI The patient is a 47 YO female coming in for needing HgA1c (prior to upcoming surgery) and wants information about heart ultrasound. She is still having some swelling in legs (she feels they are bad today) and abdominal bloating (still taking mounjaro).   Review of Systems  Constitutional:  Positive for activity change and appetite change.  HENT: Negative.    Eyes: Negative.   Respiratory:  Negative for cough, chest tightness and shortness of breath.   Cardiovascular:  Positive for leg swelling. Negative for chest pain and palpitations.  Gastrointestinal:  Positive for abdominal distention and abdominal pain. Negative for constipation, diarrhea, nausea and vomiting.  Musculoskeletal:  Positive for arthralgias, back pain and myalgias.  Skin: Negative.   Neurological: Negative.   Psychiatric/Behavioral: Negative.      Objective:  Physical Exam Constitutional:      Appearance: She is well-developed.  HENT:     Head: Normocephalic and atraumatic.  Cardiovascular:     Rate and Rhythm: Normal rate and regular rhythm.  Pulmonary:     Effort: Pulmonary effort is normal. No respiratory distress.     Breath sounds: Normal breath sounds. No wheezing or rales.  Abdominal:     General: Bowel sounds are normal. There is no distension.     Palpations: Abdomen is soft.     Tenderness: There is no abdominal tenderness. There is no rebound.     Comments: Adiposity versus bloating mild in the abdomen  Musculoskeletal:     Cervical back: Normal range of motion.     Comments: Trace edema feet and legs non-pitting  Skin:    General: Skin is warm and dry.  Neurological:     Mental Status: She is alert and oriented to person, place, and time.     Coordination: Coordination normal.     Vitals:   08/31/22 0851  BP: (!) 140/100  Pulse: 72  Temp: 97.9 F (36.6 C)  TempSrc: Oral  SpO2:  99%  Weight: 203 lb (92.1 kg)  Height: 5\' 6"  (1.676 m)    Assessment & Plan:

## 2022-09-01 NOTE — Assessment & Plan Note (Signed)
She is very concerned about abdominal distention and weight change. I talked with her today about how her bariatric procedure typically weight stability and loss is best in first 5 years and she is nearing 5 year mark and she is at risk to start regaining weight especially with diet changes. This is likely what is happening rather than medical problem causing the weight changes.

## 2022-09-01 NOTE — Assessment & Plan Note (Signed)
Checking HgA1c POC today and this is at goal. She is on multiple medications which she is using for weight management and still is regaining some weight. She sees bariatrics and they are managing her medications for this taking jardiance and mounjaro and metformin

## 2022-09-01 NOTE — Assessment & Plan Note (Signed)
Due to chronic oxycodone and she is going to the bathroom okay lately. She is having abdominal pain and bloating and we talked again about how mounjaro can cause this due to delayed gut motility. She attributes this to a specific place she got take out food several times and will avoid that going forward.

## 2022-09-01 NOTE — Assessment & Plan Note (Signed)
Reviewed her recent ultrasound of heart with her and this is normal. There is not a medical cause of her leg swelling. Encouraged to use compression stockings to help. Will not do lasix treatment given polypharmacy and trouble with maintaining accurate medication list given her multiple specialists in different health systems.

## 2022-09-01 NOTE — Assessment & Plan Note (Signed)
Checking lipid panel and adjust as needed she is not on statin currently.

## 2022-09-12 DIAGNOSIS — R0989 Other specified symptoms and signs involving the circulatory and respiratory systems: Secondary | ICD-10-CM | POA: Diagnosis not present

## 2022-09-12 DIAGNOSIS — E559 Vitamin D deficiency, unspecified: Secondary | ICD-10-CM | POA: Diagnosis not present

## 2022-09-12 DIAGNOSIS — E119 Type 2 diabetes mellitus without complications: Secondary | ICD-10-CM | POA: Diagnosis not present

## 2022-09-12 DIAGNOSIS — Z79899 Other long term (current) drug therapy: Secondary | ICD-10-CM | POA: Diagnosis not present

## 2022-09-12 DIAGNOSIS — E7800 Pure hypercholesterolemia, unspecified: Secondary | ICD-10-CM | POA: Diagnosis not present

## 2022-09-12 DIAGNOSIS — E039 Hypothyroidism, unspecified: Secondary | ICD-10-CM | POA: Diagnosis not present

## 2022-09-12 DIAGNOSIS — M79605 Pain in left leg: Secondary | ICD-10-CM | POA: Diagnosis not present

## 2022-09-12 DIAGNOSIS — I10 Essential (primary) hypertension: Secondary | ICD-10-CM | POA: Diagnosis not present

## 2022-09-12 DIAGNOSIS — M79604 Pain in right leg: Secondary | ICD-10-CM | POA: Diagnosis not present

## 2022-09-13 ENCOUNTER — Other Ambulatory Visit (HOSPITAL_BASED_OUTPATIENT_CLINIC_OR_DEPARTMENT_OTHER): Payer: Self-pay

## 2022-09-13 ENCOUNTER — Ambulatory Visit (INDEPENDENT_AMBULATORY_CARE_PROVIDER_SITE_OTHER): Payer: 59

## 2022-09-13 VITALS — Ht 66.0 in | Wt 203.0 lb

## 2022-09-13 DIAGNOSIS — Z0000 Encounter for general adult medical examination without abnormal findings: Secondary | ICD-10-CM

## 2022-09-13 MED ORDER — MOUNJARO 10 MG/0.5ML ~~LOC~~ SOAJ
10.0000 mg | SUBCUTANEOUS | 1 refills | Status: DC
  Filled 2022-09-13: qty 2, 28d supply, fill #0
  Filled 2022-11-04: qty 2, 28d supply, fill #1

## 2022-09-13 NOTE — Patient Instructions (Addendum)
Ms. Hollan , Thank you for taking time to come for your Medicare Wellness Visit. I appreciate your ongoing commitment to your health goals. Please review the following plan we discussed and let me know if I can assist you in the future.   These are the goals we discussed:  Goals      Client understands the importance of follow-up with providers by attending scheduled visits        This is a list of the screening recommended for you and due dates:  Health Maintenance  Topic Date Due   Yearly kidney health urinalysis for diabetes  12/11/2018   COVID-19 Vaccine (4 - 2023-24 season) 09/16/2022*   Flu Shot  10/19/2022   Eye exam for diabetics  12/06/2022   Hemoglobin A1C  03/02/2023   Mammogram  06/01/2023   Complete foot exam   06/12/2023   Colon Cancer Screening  08/08/2023   Yearly kidney function blood test for diabetes  08/11/2023   Medicare Annual Wellness Visit  09/13/2023   Pap Smear  05/22/2025   DTaP/Tdap/Td vaccine (3 - Td or Tdap) 07/13/2026   Hepatitis C Screening  Completed   HIV Screening  Completed   HPV Vaccine  Aged Out  *Topic was postponed. The date shown is not the original due date.    Advanced directives: Yes; documents on file with [facility].  Conditions/risks identified: Yes  Next appointment:  It was nice speaking with you today!  Please follow up in one year for your annual wellness visit via telephone call with Nurse Percell Miller on 09/19/2023 at 3:30 p.m.  If you need to cancel or reschedule please call [phone].  Preventive Care 40-64 Years, Female Preventive care refers to lifestyle choices and visits with your health care provider that can promote health and wellness. What does preventive care include? A yearly physical exam. This is also called an annual well check. Dental exams once or twice a year. Routine eye exams. Ask your health care provider how often you should have your eyes checked. Personal lifestyle choices, including: Daily care of  your teeth and gums. Regular physical activity. Eating a healthy diet. Avoiding tobacco and drug use. Limiting alcohol use. Practicing safe sex. Taking low-dose aspirin daily starting at age 73. Taking vitamin and mineral supplements as recommended by your health care provider. What happens during an annual well check? The services and screenings done by your health care provider during your annual well check will depend on your age, overall health, lifestyle risk factors, and family history of disease. Counseling  Your health care provider may ask you questions about your: Alcohol use. Tobacco use. Drug use. Emotional well-being. Home and relationship well-being. Sexual activity. Eating habits. Work and work [employer]. Method of birth control. Menstrual cycle. Pregnancy history. Screening  You may have the following tests or measurements: Height, weight, and BMI. Blood pressure. Lipid and cholesterol levels. These may be checked every 5 years, or more frequently if you are over 61 years old. Skin check. Lung cancer screening. You may have this screening every year starting at age 24 if you have a 30-pack-year history of smoking and currently smoke or have quit within the past 15 years. Fecal occult blood test (FOBT) of the stool. You may have this test every year starting at age 55. Flexible sigmoidoscopy or colonoscopy. You may have a sigmoidoscopy every 5 years or a colonoscopy every 10 years starting at age 75. Hepatitis C blood test. Hepatitis B blood test. Sexually  transmitted disease (STD) testing. Diabetes screening. This is done by checking your blood sugar (glucose) after you have not eaten for a while (fasting). You may have this done every 1-3 years. Mammogram. This may be done every 1-2 years. Talk to your health care provider about when you should start having regular mammograms. This may depend on whether you have a family history of breast cancer. BRCA-related  cancer screening. This may be done if you have a family history of breast, ovarian, tubal, or peritoneal cancers. Pelvic exam and Pap test. This may be done every 3 years starting at age 86. Starting at age 29, this may be done every 5 years if you have a Pap test in combination with an HPV test. Bone density scan. This is done to screen for osteoporosis. You may have this scan if you are at high risk for osteoporosis. Discuss your test results, treatment options, and if necessary, the need for more tests with your health care provider. Vaccines  Your health care provider may recommend certain vaccines, such as: Influenza vaccine. This is recommended every year. Tetanus, diphtheria, and acellular pertussis (Tdap, Td) vaccine. You may need a Td booster every 10 years. Zoster vaccine. You may need this after age 23. Pneumococcal 13-valent conjugate (PCV13) vaccine. You may need this if you have certain conditions and were not previously vaccinated. Pneumococcal polysaccharide (PPSV23) vaccine. You may need one or two doses if you smoke cigarettes or if you have certain conditions. Talk to your health care provider about which screenings and vaccines you need and how often you need them. This information is not intended to replace advice given to you by your health care provider. Make sure you discuss any questions you have with your health care provider. Document Released: 04/02/2015 Document Revised: 11/24/2015 Document Reviewed: 01/05/2015 Elsevier Interactive Patient Education  2017 [employer].    Fall Prevention in the Home Falls can cause injuries. They can happen to people of all ages. There are many things you can do to make your home safe and to help prevent falls. What can I do on the outside of my home? Regularly fix the edges of walkways and driveways and fix any cracks. Remove anything that might make you trip as you walk through a door, such as a raised step or threshold. Trim  any bushes or trees on the path to your home. Use bright outdoor lighting. Clear any walking paths of anything that might make someone trip, such as rocks or tools. Regularly check to see if handrails are loose or broken. Make sure that both sides of any steps have handrails. Any raised decks and porches should have guardrails on the edges. Have any leaves, snow, or ice cleared regularly. Use sand or salt on walking paths during winter. Clean up any spills in your garage right away. This includes oil or grease spills. What can I do in the bathroom? Use night lights. Install grab bars by the toilet and in the tub and shower. Do not use towel bars as grab bars. Use non-skid mats or decals in the tub or shower. If you need to sit down in the shower, use a plastic, non-slip stool. Keep the floor dry. Clean up any water that spills on the floor as soon as it happens. Remove soap buildup in the tub or shower regularly. Attach bath mats securely with double-sided non-slip rug tape. Do not have throw rugs and other things on the floor that can make you trip. What  can I do in the bedroom? Use night lights. Make sure that you have a light by your bed that is easy to reach. Do not use any sheets or blankets that are too big for your bed. They should not hang down onto the floor. Have a firm chair that has side arms. You can use this for support while you get dressed. Do not have throw rugs and other things on the floor that can make you trip. What can I do in the kitchen? Clean up any spills right away. Avoid walking on wet floors. Keep items that you use a lot in easy-to-reach places. If you need to reach something above you, use a strong step stool that has a grab bar. Keep electrical cords out of the way. Do not use floor polish or wax that makes floors slippery. If you must use wax, use non-skid floor wax. Do not have throw rugs and other things on the floor that can make you trip. What can I  do with my stairs? Do not leave any items on the stairs. Make sure that there are handrails on both sides of the stairs and use them. Fix handrails that are broken or loose. Make sure that handrails are as long as the stairways. Check any carpeting to make sure that it is firmly attached to the stairs. Fix any carpet that is loose or worn. Avoid having throw rugs at the top or bottom of the stairs. If you do have throw rugs, attach them to the floor with carpet tape. Make sure that you have a light switch at the top of the stairs and the bottom of the stairs. If you do not have them, ask someone to add them for you. What else can I do to help prevent falls? Wear shoes that: Do not have high heels. Have rubber bottoms. Are comfortable and fit you well. Are closed at the toe. Do not wear sandals. If you use a stepladder: Make sure that it is fully opened. Do not climb a closed stepladder. Make sure that both sides of the stepladder are locked into place. Ask someone to hold it for you, if possible. Clearly mark and make sure that you can see: Any grab bars or handrails. First and last steps. Where the edge of each step is. Use tools that help you move around (mobility aids) if they are needed. These include: Canes. Walkers. Scooters. Crutches. Turn on the lights when you go into a dark area. Replace any light bulbs as soon as they burn out. Set up your furniture so you have a clear path. Avoid moving your furniture around. If any of your floors are uneven, fix them. If there are any pets around you, be aware of where they are. Review your medicines with your doctor. Some medicines can make you feel dizzy. This can increase your chance of falling. Ask your doctor what other things that you can do to help prevent falls. This information is not intended to replace advice given to you by your health care provider. Make sure you discuss any questions you have with your health care  provider. Document Released: 12/31/2008 Document Revised: 08/12/2015 Document Reviewed: 04/10/2014 Elsevier Interactive Patient Education  2017 [employer].
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
## 2022-09-14 DIAGNOSIS — Z79899 Other long term (current) drug therapy: Secondary | ICD-10-CM | POA: Diagnosis not present

## 2022-09-15 DIAGNOSIS — M542 Cervicalgia: Secondary | ICD-10-CM | POA: Diagnosis not present

## 2022-09-15 DIAGNOSIS — M545 Low back pain, unspecified: Secondary | ICD-10-CM | POA: Diagnosis not present

## 2022-09-15 DIAGNOSIS — G8929 Other chronic pain: Secondary | ICD-10-CM | POA: Diagnosis not present

## 2022-09-15 DIAGNOSIS — Z79899 Other long term (current) drug therapy: Secondary | ICD-10-CM | POA: Diagnosis not present

## 2022-09-19 DIAGNOSIS — E039 Hypothyroidism, unspecified: Secondary | ICD-10-CM | POA: Diagnosis not present

## 2022-09-19 DIAGNOSIS — E1140 Type 2 diabetes mellitus with diabetic neuropathy, unspecified: Secondary | ICD-10-CM | POA: Diagnosis not present

## 2022-09-22 ENCOUNTER — Other Ambulatory Visit (HOSPITAL_BASED_OUTPATIENT_CLINIC_OR_DEPARTMENT_OTHER): Payer: Self-pay

## 2022-09-22 DIAGNOSIS — E119 Type 2 diabetes mellitus without complications: Secondary | ICD-10-CM | POA: Diagnosis not present

## 2022-09-22 MED ORDER — MOUNJARO 12.5 MG/0.5ML ~~LOC~~ SOAJ
12.5000 mg | SUBCUTANEOUS | 2 refills | Status: DC
  Filled 2022-09-22: qty 2, 28d supply, fill #0

## 2022-09-23 ENCOUNTER — Emergency Department (HOSPITAL_BASED_OUTPATIENT_CLINIC_OR_DEPARTMENT_OTHER)
Admission: EM | Admit: 2022-09-23 | Discharge: 2022-09-23 | Disposition: A | Discharge status: Home / Self Care | Payer: 59 | Attending: Emergency Medicine | Admitting: Emergency Medicine

## 2022-09-23 ENCOUNTER — Encounter (HOSPITAL_BASED_OUTPATIENT_CLINIC_OR_DEPARTMENT_OTHER): Payer: Self-pay

## 2022-09-23 ENCOUNTER — Emergency Department (HOSPITAL_BASED_OUTPATIENT_CLINIC_OR_DEPARTMENT_OTHER): Payer: 59 | Admitting: Radiology

## 2022-09-23 DIAGNOSIS — M19072 Primary osteoarthritis, left ankle and foot: Secondary | ICD-10-CM | POA: Diagnosis not present

## 2022-09-23 DIAGNOSIS — X501XXA Overexertion from prolonged static or awkward postures, initial encounter: Secondary | ICD-10-CM | POA: Insufficient documentation

## 2022-09-23 DIAGNOSIS — S99922A Unspecified injury of left foot, initial encounter: Secondary | ICD-10-CM | POA: Diagnosis not present

## 2022-09-23 DIAGNOSIS — M7732 Calcaneal spur, left foot: Secondary | ICD-10-CM | POA: Diagnosis not present

## 2022-09-23 DIAGNOSIS — M79672 Pain in left foot: Secondary | ICD-10-CM | POA: Diagnosis not present

## 2022-09-23 NOTE — Discharge Instructions (Addendum)
Contact a health care provider if: Medicine does not help your pain. Your bruising or swelling gets worse or does not get better with treatment. Your splint, boot, or cast is damaged. Get help right away if: You develop severe numbness or tingling in your foot. Your foot turns blue, white, or gray, and it feels cold.

## 2022-09-23 NOTE — ED Triage Notes (Signed)
She tells me that a cou0ple of days ago she "twisted" her left foot. She c/o swelling of left #1 m.t.p. joint area at times.

## 2022-09-23 NOTE — ED Provider Notes (Signed)
[place] EMERGENCY DEPARTMENT AT [facility] Provider Note   CSN: [id number] Arrival date & time: 09/23/22  2841     History  Chief Complaint  Patient presents with   Foot Injury    Jade Mathis is a 47 y.o. female who presents emergency department chief complaint of left foot pain.  Patient states that she was walking 1 week ago and twisted her foot.  She did not have a planted toe.  She is unsure of how she injured the foot but states that she still has a little bit of pain when bearing weight.  She has been able to ambulate without assistance or assistive devices.  She just wanted to get it checked out.  No numbness or tingling.   Foot Injury      Home Medications Prior to Admission medications   Medication Sig Start Date End Date Taking? Authorizing Provider  acetaminophen (TYLENOL) 325 MG tablet Take 2 tablets (650 mg total) by mouth every 6 (six) hours as needed. 11/04/21   Gareth Eagle, PA-C  albuterol (VENTOLIN HFA) 108 (90 Base) MCG/ACT inhaler TAKE 2 PUFFS BY MOUTH EVERY 6 HOURS AS NEEDED FOR WHEEZE OR SHORTNESS OF BREATH 12/28/21   Myrlene Broker, MD  Atogepant (QULIPTA) 60 MG TABS Take 60 mg by mouth daily.    [provider]  azaTHIOprine (IMURAN) 50 MG tablet Take 3 tablets (150 mg total) by mouth in the morning. 08/03/22   Antony Madura, MD  b complex vitamins tablet Take 1 tablet by mouth daily.    [provider]  Blood Glucose Monitoring Suppl (ACCU-CHEK AVIVA PLUS) w/Device KIT Use to test blood sugar daily 01/24/19   Myrlene Broker, MD  cetirizine (ZYRTEC) 10 MG tablet TAKE 1 TABLET BY MOUTH EVERY DAY Patient taking differently: Take 10 mg by mouth daily. 08/17/20   Myrlene Broker, MD  DULoxetine (CYMBALTA) 60 MG capsule Take 60 mg by mouth daily. 11/13/17   [provider]  ergocalciferol (VITAMIN D2) 1.25 MG (50000 UT) capsule Take 50,000 Units by mouth every Sunday.    [provider]   Fluticasone-Salmeterol (ADVAIR) 100-50 MCG/DOSE AEPB Inhale 1 puff into the lungs 2 (two) times daily. 08/16/20   Mikhail, Nita Sells, DO  glucose blood [facility] VERIO) test strip USE AS INSTRUCTED TWICE A DAY 05/15/22   Myrlene Broker, MD  hydrOXYzine (ATARAX/VISTARIL) 10 MG tablet Take 10 mg by mouth 3 (three) times daily as needed for itching.    [provider]  ibuprofen (ADVIL) 400 MG tablet Take 1 tablet (400 mg total) by mouth every 6 (six) hours as needed. 06/12/22   Myrlene Broker, MD  Insulin Pen Needle 31G X 5 MM MISC Use daily with insulin pen 07/15/18   Reather Littler, MD  JARDIANCE 25 MG TABS tablet Take 25 mg by mouth daily. 08/01/21   [provider]  levothyroxine (SYNTHROID) 175 MCG tablet Take 150 mcg by mouth daily. 05/26/21   [provider]  lisdexamfetamine (VYVANSE) 40 MG capsule Take 1 capsule (40 mg total) by mouth daily. 08/22/22     lubiprostone (AMITIZA) 24 MCG capsule TAKE 1 CAPSULE (24 MCG TOTAL) BY MOUTH 2 (TWO) TIMES DAILY WITH A MEAL. 07/10/22   Myrlene Broker, MD  metFORMIN (GLUCOPHAGE-XR) 500 MG 24 hr tablet Take 1,000 mg by mouth at bedtime.    [provider]  naloxone [facility]) nasal spray 4 mg/0.1 mL SMARTSIG:Both Nares 07/19/22   [provider]  nystatin (MYCOSTATIN/NYSTOP) powder Apply 1 Application topically 3 (three) times daily. 06/12/22   Myrlene Broker, MD  nystatin-triamcinolone ointment [facility]) Apply 1 Application topically 2 (two) times daily. 07/05/22   Myrlene Broker, MD  ondansetron (ZOFRAN) 4 MG tablet Take 1 tablet (4 mg total) by mouth every 8 (eight) hours as needed for nausea or vomiting. 11/10/21   Myrlene Broker, MD  ondansetron (ZOFRAN-ODT) 8 MG disintegrating tablet Take 1 tablet (8 mg total) by mouth every 8 (eight) hours as needed for nausea. 07/20/22   Derwood Kaplan, MD  OneTouch Delica Lancets 33G MISC Use twice a day 05/04/21   Myrlene Broker, MD  oxyCODONE  (ROXICODONE) 15 MG immediate release tablet Take 15 mg by mouth 4 (four) times daily as needed. 11/04/21   [provider]  pantoprazole (PROTONIX) 40 MG tablet TAKE 1 TABLET BY MOUTH EVERY DAY Patient taking differently: Take 40 mg by mouth daily before breakfast. 10/04/18   Adam Phenix, MD  predniSONE (DELTASONE) 2.5 MG tablet Take 2 tablets (5 mg total) by mouth daily. 08/03/22   Antony Madura, MD  promethazine (PHENERGAN) 12.5 MG tablet Take 1 tablet (12.5 mg total) by mouth every 8 (eight) hours as needed for nausea or vomiting. 11/29/20   Myrlene Broker, MD  pyridostigmine (MESTINON) 60 MG tablet Take 1 tablet (60 mg total) by mouth 4 (four) times daily. 08/03/22   Antony Madura, MD  QUEtiapine (SEROQUEL) 100 MG tablet Take 1 tablet (100 mg total) by mouth at bedtime. 08/30/21   Rhetta Mura, MD  simethicone (GAS-X) 80 MG chewable tablet Chew 1 tablet (80 mg total) by mouth every 6 (six) hours as needed for flatulence. 07/20/22   Derwood Kaplan, MD  tirzepatide [facility]) 10 MG/0.5ML Pen Inject 10 mg into the skin once a week. 09/12/22     tirzepatide (MOUNJARO) 12.5 MG/0.5ML Pen Inject 12.5 mg into the skin once a week. 09/22/22     tirzepatide (MOUNJARO) 5 MG/0.5ML Pen Inject 5 mg into the skin once a week. 07/24/22     tirzepatide (MOUNJARO) 7.5 MG/0.5ML Pen Inject 7.5 mg into the skin once a week. 07/24/22     topiramate (TOPAMAX) 200 MG tablet Take 200 mg by mouth daily in the afternoon.    [provider]  traZODone (DESYREL) 100 MG tablet Take 100 mg by mouth at bedtime. 10/12/21   [provider]  triamcinolone ointment (KENALOG) 0.5 % Apply 1 Application topically 2 (two) times daily. 07/05/22   Myrlene Broker, MD  valACYclovir (VALTREX) 500 MG tablet Take 1 tablet (500 mg total) by mouth 2 (two) times daily. Patient taking differently: Take 500 mg by mouth 2 (two) times daily as needed (as directed for breakouts). 04/27/20   Myrlene Broker,  MD  zolmitriptan (ZOMIG) 5 MG tablet Take 1 tablet (5 mg total) by mouth as needed for migraine. 06/12/22   Myrlene Broker, MD      Allergies    Magnesium-containing compounds, Depo-provera [medroxyprogesterone], Other, and Vicodin [hydrocodone-acetaminophen]    Review of Systems   Review of Systems  Physical Exam Updated Vital Signs BP 100/72 (BP Location: Right Arm)   Pulse 81   Temp 98.1 F (36.7 C) (Oral)   Resp 16   Ht 5\' 6"  (1.676 m)   Wt 89.8 kg   LMP  (LMP Unknown)   SpO2 100%   BMI 31.96 kg/m  Physical Exam Physical Exam  Nursing note and vitals reviewed.  Constitutional: She is oriented to person, place, and time. She appears well-developed and well-nourished. No distress.  HENT:  Head: Normocephalic and atraumatic.  Eyes: Conjunctivae normal and EOM are normal. Pupils are equal, round, and reactive to light. No scleral icterus.  Neck: Normal range of motion.  Cardiovascular: Normal rate, regular rhythm and normal heart sounds.  Exam reveals no gallop and no friction rub.   No murmur heard. Pulmonary/Chest: Effort normal and breath sounds normal. No respiratory distress.  Abdominal: Soft. Bowel sounds are normal. She exhibits no distension and no mass. There is no tenderness. There is no guarding.  Neurological: She is alert and oriented to person, place, and time.  Musculoskeletal: A right and left foot and ankle examination performed.  Normal right foot and ankle examination.  The left foot and ankle shows no reduced range of motion swelling or deformity.  Cap refill less than 2 seconds.  No significant tenderness to palpation bruising or swelling.  Normal sensation Skin: Skin is warm and dry. She is not diaphoretic.   ED Results / Procedures / Treatments   Labs (all labs ordered are listed, but only abnormal results are displayed) Labs Reviewed - No data to display  EKG None  Radiology DG Foot Complete Left  Result Date: 09/23/2022 CLINICAL DATA:   Twisting injury to the left foot.  Pain. EXAM: LEFT FOOT - COMPLETE 3+ VIEW COMPARISON:  None Available. FINDINGS: No fracture.  No bone lesion. Mild narrowing of the first metatarsophalangeal joint with mild subchondral sclerosis and minor marginal osteophyte formation. Remaining joints are normally spaced and aligned. Small plantar calcaneal spur. Soft tissues are unremarkable. IMPRESSION: 1. No fracture or acute finding. 2. Mild first metatarsophalangeal joint osteoarthritis. Electronically Signed   By: Amie Portland M.D.   On: 09/23/2022 10:57    Procedures Procedures    Medications Ordered in ED Medications - No data to display  ED Course/ Medical Decision Making/ A&P                             Medical Decision Making Amount and/or Complexity of Data Reviewed Radiology: ordered.   Patient with left foot injury.  I visualized and interpreted a left foot x-ray which shows no evidence fracture or dislocation.  Low suspicion for Lisfranc injury based on mechanism and location of pain.  Will have her follow-up with orthopedics if she should not improve with supportive care.        Final Clinical Impression(s) / ED Diagnoses Final diagnoses:  Foot injury, left, initial encounter    Rx / DC Orders ED Discharge Orders     None         Arthor Captain, PA-C 09/23/22 1149    Alvira Monday, MD 09/25/22 2056

## 2022-09-26 ENCOUNTER — Other Ambulatory Visit: Payer: Self-pay

## 2022-09-26 ENCOUNTER — Other Ambulatory Visit: Payer: 59

## 2022-09-26 DIAGNOSIS — E669 Obesity, unspecified: Secondary | ICD-10-CM

## 2022-09-26 LAB — LIPID PANEL
Cholesterol: 147 mg/dL (ref 0–200)
HDL: 34.6 mg/dL — ABNORMAL LOW (ref >39.00)
LDL Cholesterol: 85 mg/dL (ref 0–99)
NonHDL: 112.73
Total CHOL/HDL Ratio: 4
Triglycerides: 138 mg/dL (ref 0.0–149.0)
VLDL: 27.6 mg/dL (ref 0.0–40.0)

## 2022-09-27 LAB — MICROALBUMIN / CREATININE URINE RATIO
Creatinine,U: 126.4 mg/dL
Microalb Creat Ratio: 0.6 mg/g (ref 0.0–30.0)
Microalb, Ur: <0.7 mg/dL (ref 0.0–1.9)

## 2022-09-29 ENCOUNTER — Telehealth: Payer: Self-pay

## 2022-09-29 NOTE — Telephone Encounter (Signed)
Transition Care Management Unsuccessful Follow-up Telephone Call  Date of discharge and from where:  [facility] 7/12  Attempts:  1st Attempt  Reason for unsuccessful TCM follow-up call:  Left voice message   Lenard Forth [employer] [facility] Guide, [facility] [phone] [address] [place], [place], [place] [address] Phone: [phone] Email: [email or web address]

## 2022-10-02 ENCOUNTER — Telehealth: Payer: Self-pay

## 2022-10-02 ENCOUNTER — Telehealth: Payer: Self-pay | Admitting: Internal Medicine

## 2022-10-02 NOTE — Telephone Encounter (Signed)
Paperwork received from [facility] for diabetic shoes. Placed in provider's box up front.

## 2022-10-02 NOTE — Telephone Encounter (Signed)
Transition Care Management Unsuccessful Follow-up Telephone Call  Date of discharge and from where:  [facility] 7/6  Attempts:  2nd Attempt  Reason for unsuccessful TCM follow-up call:  No answer/busy.   Lenard Forth [facility] Guide, [facility] [phone] [address] [place], [place], [place] [address] Phone: [phone] Email: [email or web address]

## 2022-10-03 ENCOUNTER — Other Ambulatory Visit (HOSPITAL_BASED_OUTPATIENT_CLINIC_OR_DEPARTMENT_OTHER): Payer: Self-pay

## 2022-10-03 DIAGNOSIS — R519 Headache, unspecified: Secondary | ICD-10-CM | POA: Diagnosis not present

## 2022-10-03 MED ORDER — MOUNJARO 10 MG/0.5ML ~~LOC~~ SOAJ
10.0000 mg | SUBCUTANEOUS | 1 refills | Status: DC
  Filled 2022-10-03 – 2022-10-04 (×2): qty 2, 28d supply, fill #0

## 2022-10-03 NOTE — Telephone Encounter (Signed)
Duplicate paper work. I have fax back form already from this yesterday for the patient. Fax confirmation:ok

## 2022-10-04 ENCOUNTER — Other Ambulatory Visit (HOSPITAL_BASED_OUTPATIENT_CLINIC_OR_DEPARTMENT_OTHER): Payer: Self-pay

## 2022-10-04 DIAGNOSIS — M79672 Pain in left foot: Secondary | ICD-10-CM | POA: Diagnosis not present

## 2022-10-04 DIAGNOSIS — S92812A Other fracture of left foot, initial encounter for closed fracture: Secondary | ICD-10-CM | POA: Diagnosis not present

## 2022-10-05 ENCOUNTER — Telehealth: Payer: Self-pay

## 2022-10-05 ENCOUNTER — Ambulatory Visit: Payer: 59 | Admitting: Nurse Practitioner

## 2022-10-05 VITALS — BP 100/66 | HR 88 | Temp 98.1°F | Ht 66.0 in | Wt 203.4 lb

## 2022-10-05 DIAGNOSIS — M79602 Pain in left arm: Secondary | ICD-10-CM

## 2022-10-05 DIAGNOSIS — R232 Flushing: Secondary | ICD-10-CM

## 2022-10-05 NOTE — Patient Instructions (Signed)
Acetaminophen - 650mg  by mouth every 6 hours while awake x 3-5 days  Call this office back for further evaluation if you start to feel any numbness, pins-and-needles, weakness to your extremity.  Also if you notice any signs of infection which can include swelling, redness, drainage, heat to touch at the area where the blood was drawn please call this office.

## 2022-10-05 NOTE — Telephone Encounter (Signed)
Pt has forms from [employer]. Stated that her power is out and she needs her power for her Nebulizer and CPAP machine.

## 2022-10-05 NOTE — Progress Notes (Signed)
Established Patient Office Visit  Subjective   Patient ID: Jade Mathis, female    DOB: Oct 21, 1975  Age: 47 y.o. MRN: [id number]  Chief Complaint  Patient presents with   left arm pain    Patient arrives for acute visit for left arm pain and hot flashes.   Left arm pain started approximately 9 days ago after she had venipuncture for labs.  Reports throbbing pain.  Has not been taking ibuprofen in preparation for upcoming surgery.  Reports hot flashes which she thinks is related to perimenopause.  Reports she has not missed any periods but periods are becoming irregular.  Denies any fever, unintentional weight loss, cough.  Due to pain with last venipuncture, patient is not wanting any additional blood work completed today.     Review of Systems  Constitutional:  Negative for chills, diaphoresis, fever, malaise/fatigue and weight loss.  Respiratory:  Negative for cough and shortness of breath.   Skin:  Negative for rash.  Neurological:  Negative for sensory change and weakness.      Objective:     BP 100/66   Pulse 88   Temp 98.1 F (36.7 C) (Temporal)   Ht 5\' 6"  (1.676 m)   Wt 203 lb 6 oz (92.3 kg)   LMP 09/30/2022   SpO2 94%   BMI 32.83 kg/m     Physical Exam Vitals reviewed.  Constitutional:      General: She is not in acute distress.    Appearance: Normal appearance.  HENT:     Head: Normocephalic and atraumatic.  Neck:     Vascular: No carotid bruit.  Cardiovascular:     Rate and Rhythm: Normal rate and regular rhythm.     Pulses: Normal pulses.     Heart sounds: Normal heart sounds.  Pulmonary:     Effort: Pulmonary effort is normal.     Breath sounds: Normal breath sounds.  Skin:    General: Skin is warm and dry.     Comments: No evidence of swelling, redness drainage, laceration to left or right arms.   Neurological:     General: No focal deficit present.     Mental Status: She is alert and oriented to person, place, and time.   Psychiatric:        Mood and Affect: Mood normal.        Behavior: Behavior normal.        Judgment: Judgment normal.      No results found for any visits on 10/05/22.     The 10-year ASCVD risk score (Arnett DK, et al., 2019) is: 3.5%    Assessment & Plan:   Problem List Items Addressed This Visit       Cardiovascular and Mediastinum   Hot flashes    New, intermittent Does not appear infectious in nature, patient unwilling to undergo additional labs today.  Also think low likelihood for oncologic cause at this time.  Thus, refer to gynecology for further evaluation. Patient encouraged to come back to office if symptoms persist or worsen.      Relevant Orders   Ambulatory referral to Obstetrics / Gynecology     Other   Left arm pain - Primary    Acute No evidence of infection, thrombophlebitis.  Patient does have pain, but pulses and sensation equal bilaterally.  Left grip does appear slightly weak, but upon encouragement patient is able to grip tighter.  She reports that gripping does cause some discomfort which is why  I think grip is slightly weak as opposed to true weakness. For now recommend pain management with over-the-counter medications.  Patient would like to avoid NSAIDs at this time.  Recommend Tylenol 650 mg every 6 hours while awake x 3 to 5 days. Patient educated to be turned to the office if she experiences redness, swelling, heat, drainage, weakness, sensory changes to her left arm or hand.  She reports understanding.       Return if symptoms worsen or fail to improve.    Elenore Paddy, NP

## 2022-10-05 NOTE — Assessment & Plan Note (Signed)
New, intermittent Does not appear infectious in nature, patient unwilling to undergo additional labs today.  Also think low likelihood for oncologic cause at this time.  Thus, refer to gynecology for further evaluation. Patient encouraged to come back to office if symptoms persist or worsen.

## 2022-10-05 NOTE — Assessment & Plan Note (Signed)
Acute No evidence of infection, thrombophlebitis.  Patient does have pain, but pulses and sensation equal bilaterally.  Left grip does appear slightly weak, but upon encouragement patient is able to grip tighter.  She reports that gripping does cause some discomfort which is why I think grip is slightly weak as opposed to true weakness. For now recommend pain management with over-the-counter medications.  Patient would like to avoid NSAIDs at this time.  Recommend Tylenol 650 mg every 6 hours while awake x 3 to 5 days. Patient educated to be turned to the office if she experiences redness, swelling, heat, drainage, weakness, sensory changes to her left arm or hand.  She reports understanding.

## 2022-10-06 ENCOUNTER — Other Ambulatory Visit (HOSPITAL_BASED_OUTPATIENT_CLINIC_OR_DEPARTMENT_OTHER): Payer: Self-pay

## 2022-10-06 DIAGNOSIS — M793 Panniculitis, unspecified: Secondary | ICD-10-CM | POA: Diagnosis not present

## 2022-10-06 DIAGNOSIS — Z01818 Encounter for other preprocedural examination: Secondary | ICD-10-CM | POA: Diagnosis not present

## 2022-10-06 NOTE — Telephone Encounter (Signed)
I don't believe we have records of her using either for her care the nebulizer or CPAP. Who prescribed these for her?

## 2022-10-10 DIAGNOSIS — S92812D Other fracture of left foot, subsequent encounter for fracture with routine healing: Secondary | ICD-10-CM | POA: Insufficient documentation

## 2022-10-10 DIAGNOSIS — M793 Panniculitis, unspecified: Secondary | ICD-10-CM | POA: Insufficient documentation

## 2022-10-10 DIAGNOSIS — F319 Bipolar disorder, unspecified: Secondary | ICD-10-CM | POA: Insufficient documentation

## 2022-10-12 ENCOUNTER — Encounter (HOSPITAL_BASED_OUTPATIENT_CLINIC_OR_DEPARTMENT_OTHER): Payer: Self-pay | Admitting: Emergency Medicine

## 2022-10-12 ENCOUNTER — Emergency Department (HOSPITAL_BASED_OUTPATIENT_CLINIC_OR_DEPARTMENT_OTHER)
Admission: EM | Admit: 2022-10-12 | Discharge: 2022-10-12 | Disposition: A | Discharge status: Home / Self Care | Payer: 59 | Attending: Emergency Medicine | Admitting: Emergency Medicine

## 2022-10-12 ENCOUNTER — Other Ambulatory Visit: Payer: Self-pay

## 2022-10-12 ENCOUNTER — Emergency Department (HOSPITAL_BASED_OUTPATIENT_CLINIC_OR_DEPARTMENT_OTHER): Payer: 59

## 2022-10-12 ENCOUNTER — Other Ambulatory Visit (HOSPITAL_BASED_OUTPATIENT_CLINIC_OR_DEPARTMENT_OTHER): Payer: Self-pay

## 2022-10-12 ENCOUNTER — Other Ambulatory Visit (HOSPITAL_COMMUNITY): Payer: Self-pay

## 2022-10-12 DIAGNOSIS — E878 Other disorders of electrolyte and fluid balance, not elsewhere classified: Secondary | ICD-10-CM | POA: Diagnosis not present

## 2022-10-12 DIAGNOSIS — E7800 Pure hypercholesterolemia, unspecified: Secondary | ICD-10-CM | POA: Diagnosis not present

## 2022-10-12 DIAGNOSIS — R0789 Other chest pain: Secondary | ICD-10-CM

## 2022-10-12 DIAGNOSIS — E559 Vitamin D deficiency, unspecified: Secondary | ICD-10-CM | POA: Diagnosis not present

## 2022-10-12 DIAGNOSIS — Z7984 Long term (current) use of oral hypoglycemic drugs: Secondary | ICD-10-CM | POA: Diagnosis not present

## 2022-10-12 DIAGNOSIS — Z79899 Other long term (current) drug therapy: Secondary | ICD-10-CM | POA: Diagnosis not present

## 2022-10-12 DIAGNOSIS — R944 Abnormal results of kidney function studies: Secondary | ICD-10-CM | POA: Insufficient documentation

## 2022-10-12 DIAGNOSIS — F419 Anxiety disorder, unspecified: Secondary | ICD-10-CM | POA: Insufficient documentation

## 2022-10-12 DIAGNOSIS — F439 Reaction to severe stress, unspecified: Secondary | ICD-10-CM | POA: Diagnosis not present

## 2022-10-12 DIAGNOSIS — E119 Type 2 diabetes mellitus without complications: Secondary | ICD-10-CM | POA: Insufficient documentation

## 2022-10-12 DIAGNOSIS — R0989 Other specified symptoms and signs involving the circulatory and respiratory systems: Secondary | ICD-10-CM | POA: Diagnosis not present

## 2022-10-12 DIAGNOSIS — Z794 Long term (current) use of insulin: Secondary | ICD-10-CM | POA: Insufficient documentation

## 2022-10-12 DIAGNOSIS — M545 Low back pain, unspecified: Secondary | ICD-10-CM | POA: Diagnosis not present

## 2022-10-12 DIAGNOSIS — G8929 Other chronic pain: Secondary | ICD-10-CM | POA: Diagnosis not present

## 2022-10-12 DIAGNOSIS — I10 Essential (primary) hypertension: Secondary | ICD-10-CM | POA: Diagnosis not present

## 2022-10-12 DIAGNOSIS — R079 Chest pain, unspecified: Secondary | ICD-10-CM | POA: Diagnosis not present

## 2022-10-12 DIAGNOSIS — M79604 Pain in right leg: Secondary | ICD-10-CM | POA: Diagnosis not present

## 2022-10-12 DIAGNOSIS — E039 Hypothyroidism, unspecified: Secondary | ICD-10-CM | POA: Diagnosis not present

## 2022-10-12 LAB — CBC
HCT: 38 % (ref 36.0–46.0)
Hemoglobin: 11.4 g/dL — ABNORMAL LOW (ref 12.0–15.0)
MCH: 25.3 pg — ABNORMAL LOW (ref 26.0–34.0)
MCHC: 30 g/dL (ref 30.0–36.0)
MCV: 84.4 fL (ref 80.0–100.0)
Platelets: 272 10*3/uL (ref 150–400)
RBC: 4.5 MIL/uL (ref 3.87–5.11)
RDW: 13.8 % (ref 11.5–15.5)
WBC: 6.2 10*3/uL (ref 4.0–10.5)
nRBC: 0 % (ref 0.0–0.2)

## 2022-10-12 LAB — TROPONIN I (HIGH SENSITIVITY)
Troponin I (High Sensitivity): 2 ng/L (ref <18)
Troponin I (High Sensitivity): 2 ng/L (ref <18)

## 2022-10-12 LAB — BASIC METABOLIC PANEL
Anion gap: 10 (ref 5–15)
BUN: 22 mg/dL — ABNORMAL HIGH (ref 6–20)
CO2: 30 mmol/L (ref 22–32)
Calcium: 8.8 mg/dL — ABNORMAL LOW (ref 8.9–10.3)
Chloride: 97 mmol/L — ABNORMAL LOW (ref 98–111)
Creatinine, Ser: 0.61 mg/dL (ref 0.44–1.00)
GFR, Estimated: >60 mL/min (ref >60)
Glucose, Bld: 103 mg/dL — ABNORMAL HIGH (ref 70–99)
Potassium: 3.6 mmol/L (ref 3.5–5.1)
Sodium: 137 mmol/L (ref 135–145)

## 2022-10-12 LAB — PREGNANCY, URINE: Preg Test, Ur: NEGATIVE

## 2022-10-12 MED ORDER — LISINOPRIL-HYDROCHLOROTHIAZIDE 10-12.5 MG PO TABS
1.0000 | ORAL_TABLET | Freq: Every day | ORAL | 3 refills | Status: AC
  Filled 2022-10-12 – 2023-09-14 (×2): qty 30, 30d supply, fill #0

## 2022-10-12 MED ORDER — MORPHINE SULFATE (PF) 2 MG/ML IV SOLN
2.0000 mg | Freq: Once | INTRAVENOUS | Status: AC
  Administered 2022-10-12: 2 mg via INTRAVENOUS
  Filled 2022-10-12: qty 1

## 2022-10-12 MED ORDER — FUROSEMIDE 40 MG PO TABS
80.0000 mg | ORAL_TABLET | Freq: Every day | ORAL | 2 refills | Status: AC
  Filled 2022-10-12 – 2023-03-27 (×2): qty 60, 30d supply, fill #0

## 2022-10-12 MED ORDER — QULIPTA 60 MG PO TABS
60.0000 mg | ORAL_TABLET | Freq: Every day | ORAL | 3 refills | Status: AC
  Filled 2022-10-12: qty 30, 30d supply, fill #0
  Filled 2023-03-27: qty 30, 30d supply, fill #1
  Filled 2023-09-14: qty 30, 30d supply, fill #2

## 2022-10-12 MED ORDER — LINZESS 290 MCG PO CAPS
290.0000 ug | ORAL_CAPSULE | Freq: Every day | ORAL | 3 refills | Status: AC
  Filled 2022-10-12: qty 30, 30d supply, fill #0
  Filled 2023-03-27: qty 30, 30d supply, fill #1
  Filled 2023-09-14: qty 30, 30d supply, fill #2

## 2022-10-12 MED ORDER — NALOXONE HCL 4 MG/0.1ML NA LIQD
NASAL | 6 refills | Status: AC
  Filled 2022-10-12: qty 2, 30d supply, fill #0

## 2022-10-12 MED ORDER — BACLOFEN 10 MG PO TABS: 10.0000 mg | ORAL_TABLET | Freq: Three times a day (TID) | ORAL | 0 refills | Status: AC

## 2022-10-12 MED ORDER — PHENTERMINE HCL 37.5 MG PO TABS
37.5000 mg | ORAL_TABLET | Freq: Every day | ORAL | 0 refills | Status: DC
  Filled 2022-10-12: qty 30, 30d supply, fill #0

## 2022-10-12 MED ORDER — TRUDHESA 0.725 MG/ACT NA AERS: 0.7250 mg | INHALATION_SPRAY | NASAL | 0 refills | Status: AC

## 2022-10-12 MED ORDER — ONDANSETRON HCL 4 MG/2ML IJ SOLN
4.0000 mg | Freq: Once | INTRAMUSCULAR | Status: AC
  Administered 2022-10-12: 4 mg via INTRAVENOUS
  Filled 2022-10-12: qty 2

## 2022-10-12 MED ORDER — ERGOCALCIFEROL 1.25 MG (50000 UT) PO CAPS
50000.0000 [IU] | ORAL_CAPSULE | ORAL | 6 refills | Status: DC
  Filled 2022-10-12: qty 4, 28d supply, fill #0
  Filled 2022-11-04 – 2023-03-27 (×3): qty 4, 28d supply, fill #1

## 2022-10-12 MED ORDER — BACLOFEN 10 MG PO TABS
10.0000 mg | ORAL_TABLET | Freq: Once | ORAL | Status: DC
  Filled 2022-10-12: qty 1

## 2022-10-12 MED ORDER — HYDROXYZINE HCL 10 MG PO TABS
10.0000 mg | ORAL_TABLET | Freq: Three times a day (TID) | ORAL | 3 refills | Status: AC | PRN
  Filled 2022-10-12: qty 60, 20d supply, fill #0

## 2022-10-12 MED ORDER — GABAPENTIN 300 MG PO CAPS
300.0000 mg | ORAL_CAPSULE | Freq: Three times a day (TID) | ORAL | 3 refills | Status: DC
  Filled 2022-10-12: qty 90, 30d supply, fill #0

## 2022-10-12 MED ORDER — TOPIRAMATE 50 MG PO TABS
50.0000 mg | ORAL_TABLET | Freq: Every day | ORAL | 0 refills | Status: DC
  Filled 2022-10-12: qty 30, 30d supply, fill #0

## 2022-10-12 MED ORDER — BACLOFEN 5 MG PO TABS
5.0000 mg | ORAL_TABLET | Freq: Two times a day (BID) | ORAL | 0 refills | Status: DC
  Filled 2022-10-12: qty 60, 30d supply, fill #0

## 2022-10-12 MED ORDER — UBRELVY 100 MG PO TABS
ORAL_TABLET | ORAL | 3 refills | Status: AC
  Filled 2022-10-12: qty 16, 30d supply, fill #0

## 2022-10-12 NOTE — ED Notes (Signed)
Patient transported to X-ray 

## 2022-10-12 NOTE — ED Provider Notes (Signed)
[facility] Provider Note   CSN: [id number] Arrival date & time: 10/12/22  1654     History  Chief Complaint  Patient presents with   Chest Pain    Jade Mathis is a 47 y.o. female with past medical history anxiety, bipolar disorder, type 2 diabetes with last A1c reported normal by patient, GERD, chronic pain, myasthenia gravis, OSA, anemia who presents to the ED complaining of left-sided sharp chest pain that started 3 days ago.  States that it is worse when she lifts her arm and with palpation of the chest.  Pain is nonradiating.  It is not associated with lightheadedness, dizziness, syncope, nausea, vomiting.  Mild shortness of breath.  No known sick contacts.  No history of heart problems.  States that she has been under a lot of stress due to her family situation and currently being in school to become a Engineer, civil (consulting). No new activity or known injury to chest wall. No history of DVT/PE.  States that she is currently off Mounjaro pending an upcoming surgery but her blood sugars are regularly checked and have been normal.      Home Medications Prior to Admission medications   Medication Sig Start Date End Date Taking? Authorizing Provider  baclofen (LIORESAL) 10 MG tablet Take 1 tablet (10 mg total) by mouth 3 (three) times daily for 3 days. 10/12/22 10/15/22 Yes Alawna Graybeal L, PA-C  acetaminophen (TYLENOL) 325 MG tablet Take 2 tablets (650 mg total) by mouth every 6 (six) hours as needed. 11/04/21   Gareth Eagle, PA-C  albuterol (VENTOLIN HFA) 108 (90 Base) MCG/ACT inhaler TAKE 2 PUFFS BY MOUTH EVERY 6 HOURS AS NEEDED FOR WHEEZE OR SHORTNESS OF BREATH 12/28/21   Myrlene Broker, MD  Atogepant (QULIPTA) 60 MG TABS Take 60 mg by mouth daily.    [provider]  Atogepant (QULIPTA) 60 MG TABS Take 1 tablet (60 mg total) by mouth daily. Take daily to prevent migraines 10/12/22     azaTHIOprine (IMURAN) 50 MG tablet Take 3  tablets (150 mg total) by mouth in the morning. 08/03/22   Antony Madura, MD  b complex vitamins tablet Take 1 tablet by mouth daily.    [provider]  Blood Glucose Monitoring Suppl (ACCU-CHEK AVIVA PLUS) w/Device KIT Use to test blood sugar daily 01/24/19   Myrlene Broker, MD  cetirizine (ZYRTEC) 10 MG tablet TAKE 1 TABLET BY MOUTH EVERY DAY 08/17/20   Myrlene Broker, MD  Dihydroergotamine Mesylate HFA (TRUDHESA) 0.725 MG/ACT AERS Place 1 actuation (0.725 mg total) into the nose as directed. The dose may be repeated, if needed, a minimum of 1 hour after the first dose. Do not use more than 2 doses within a 24-hour period or 3 doses within 7 days. 10/12/22     DULoxetine (CYMBALTA) 60 MG capsule Take 60 mg by mouth daily. 11/13/17   [provider]  ergocalciferol (VITAMIN D2) 1.25 MG (50000 UT) capsule Take 50,000 Units by mouth every Sunday.    [provider]  ergocalciferol (VITAMIN D2) 1.25 MG (50000 UT) capsule Take 1 capsule (50,000 Units total) by mouth once a week. 10/12/22     Fluticasone-Salmeterol (ADVAIR) 100-50 MCG/DOSE AEPB Inhale 1 puff into the lungs 2 (two) times daily. 08/16/20   Mikhail, Nita Sells, DO  furosemide (LASIX) 40 MG tablet Take 2 tablets (80 mg total) by mouth daily. 10/12/22     gabapentin (NEURONTIN) 300 MG capsule  Take 1 capsule (300 mg total) by mouth 3 (three) times daily. 10/12/22     glucose blood (ONETOUCH VERIO) test strip USE AS INSTRUCTED TWICE A DAY 05/15/22   Myrlene Broker, MD  hydrOXYzine (ATARAX) 10 MG tablet Take 1 tablet (10 mg total) by mouth 3 (three) times daily as needed. 10/12/22     hydrOXYzine (ATARAX/VISTARIL) 10 MG tablet Take 10 mg by mouth 3 (three) times daily as needed for itching.    [provider]  ibuprofen (ADVIL) 400 MG tablet Take 1 tablet (400 mg total) by mouth every 6 (six) hours as needed. 06/12/22   Myrlene Broker, MD  Insulin Pen Needle 31G X 5 MM MISC Use daily with insulin  pen 07/15/18   Reather Littler, MD  JARDIANCE 25 MG TABS tablet Take 25 mg by mouth daily. 08/01/21   [provider]  levothyroxine (SYNTHROID) 175 MCG tablet Take 150 mcg by mouth daily. 05/26/21   [provider]  linaclotide (LINZESS) 290 MCG CAPS capsule Take 1 capsule (290 mcg total) by mouth daily. 10/12/22     lisdexamfetamine (VYVANSE) 40 MG capsule Take 1 capsule (40 mg total) by mouth daily. 08/22/22     lisinopril-hydrochlorothiazide (ZESTORETIC) 10-12.5 MG tablet Take 1 tablet by mouth daily. 10/12/22     lubiprostone (AMITIZA) 24 MCG capsule TAKE 1 CAPSULE (24 MCG TOTAL) BY MOUTH 2 (TWO) TIMES DAILY WITH A MEAL. 07/10/22   Myrlene Broker, MD  metFORMIN (GLUCOPHAGE-XR) 500 MG 24 hr tablet Take 1,000 mg by mouth at bedtime.    [provider]  naloxone [facility]) nasal spray 4 mg/0.1 mL SMARTSIG:Both Nares 07/19/22   [provider]  naloxone [facility]) nasal spray 4 mg/0.1 mL Use 1 (one) actuation as directed, incase of opioid overdose   May repeat dose every 2-3 minutes until pt responsive or until EMS arrives. 10/12/22     nystatin (MYCOSTATIN/NYSTOP) powder Apply 1 Application topically 3 (three) times daily. 06/12/22   Myrlene Broker, MD  nystatin-triamcinolone ointment [facility]) Apply 1 Application topically 2 (two) times daily. 07/05/22   Myrlene Broker, MD  ondansetron (ZOFRAN) 4 MG tablet Take 1 tablet (4 mg total) by mouth every 8 (eight) hours as needed for nausea or vomiting. 11/10/21   Myrlene Broker, MD  ondansetron (ZOFRAN-ODT) 8 MG disintegrating tablet Take 1 tablet (8 mg total) by mouth every 8 (eight) hours as needed for nausea. 07/20/22   Derwood Kaplan, MD  OneTouch Delica Lancets 33G MISC Use twice a day 05/04/21   Myrlene Broker, MD  oxyCODONE (ROXICODONE) 15 MG immediate release tablet Take 15 mg by mouth 4 (four) times daily as needed. 11/04/21   [provider]  pantoprazole (PROTONIX) 40 MG tablet TAKE 1  TABLET BY MOUTH EVERY DAY Patient taking differently: Take 40 mg by mouth daily before breakfast. 10/04/18   Adam Phenix, MD  phentermine (ADIPEX-P) 37.5 MG tablet Take 1 tablet by mouth daily 10/12/22     predniSONE (DELTASONE) 2.5 MG tablet Take 2 tablets (5 mg total) by mouth daily. 08/03/22   Antony Madura, MD  promethazine (PHENERGAN) 12.5 MG tablet Take 1 tablet (12.5 mg total) by mouth every 8 (eight) hours as needed for nausea or vomiting. 11/29/20   Myrlene Broker, MD  pyridostigmine (MESTINON) 60 MG tablet Take 1 tablet (60 mg total) by mouth 4 (four) times daily. 08/03/22   Antony Madura, MD  QUEtiapine (SEROQUEL) 100 MG tablet Take 1  tablet (100 mg total) by mouth at bedtime. 08/30/21   Rhetta Mura, MD  simethicone (GAS-X) 80 MG chewable tablet Chew 1 tablet (80 mg total) by mouth every 6 (six) hours as needed for flatulence. 07/20/22   Derwood Kaplan, MD  tirzepatide [facility]) 10 MG/0.5ML Pen Inject 10 mg into the skin once a week. 09/12/22     tirzepatide (MOUNJARO) 12.5 MG/0.5ML Pen Inject 12.5 mg into the skin once a week. 09/22/22     tirzepatide (MOUNJARO) 5 MG/0.5ML Pen Inject 5 mg into the skin once a week. 07/24/22     tirzepatide (MOUNJARO) 7.5 MG/0.5ML Pen Inject 7.5 mg into the skin once a week. 07/24/22     topiramate (TOPAMAX) 200 MG tablet Take 200 mg by mouth daily in the afternoon.    [provider]  topiramate (TOPAMAX) 50 MG tablet Take 1 tablet (50 mg total) by mouth at bedtime. 10/12/22     traZODone (DESYREL) 100 MG tablet Take 100 mg by mouth at bedtime. 10/12/21   [provider]  triamcinolone ointment (KENALOG) 0.5 % Apply 1 Application topically 2 (two) times daily. 07/05/22   Myrlene Broker, MD  Ubrogepant (UBRELVY) 100 MG TABS Take 1 (one) Tablet by mouth as directed, Take at onset of Migraine HA , may repeat dose in 2 hrs . No more than 200mg / 24 hrs 10/12/22     valACYclovir (VALTREX) 500 MG tablet Take 1 tablet (500 mg total)  by mouth 2 (two) times daily. 04/27/20   Myrlene Broker, MD  zolmitriptan (ZOMIG) 5 MG tablet Take 1 tablet (5 mg total) by mouth as needed for migraine. 06/12/22   Myrlene Broker, MD      Allergies    Magnesium-containing compounds, Depo-provera [medroxyprogesterone], Other, and Vicodin [hydrocodone-acetaminophen]    Review of Systems   Review of Systems  All other systems reviewed and are negative.   Physical Exam Updated Vital Signs BP (!) 112/90 (BP Location: Left Arm)   Pulse 80   Temp 98.5 F (36.9 C) (Oral)   Resp 17   Ht 5\' 6"  (1.676 m)   Wt 92.1 kg   LMP 09/30/2022   SpO2 99%   BMI 32.77 kg/m  Physical Exam Vitals and nursing note reviewed.  Constitutional:      General: She is not in acute distress.    Appearance: Normal appearance.  HENT:     Head: Normocephalic and atraumatic.     Mouth/Throat:     Mouth: Mucous membranes are moist.  Eyes:     Conjunctiva/sclera: Conjunctivae normal.  Cardiovascular:     Rate and Rhythm: Normal rate and regular rhythm.     Heart sounds: Normal heart sounds. No murmur heard.    No S3 or S4 sounds.  Pulmonary:     Effort: Pulmonary effort is normal. No tachypnea, accessory muscle usage or respiratory distress.     Breath sounds: Normal breath sounds. No decreased breath sounds, wheezing, rhonchi or rales.  Chest:     Chest wall: Tenderness (L chest, reproduces complaint) present. No mass, deformity, crepitus or edema.  Abdominal:     General: Abdomen is flat.     Palpations: Abdomen is soft.     Tenderness: There is no abdominal tenderness.  Musculoskeletal:        General: Normal range of motion.     Cervical back: Neck supple.     Right lower leg: No tenderness. No edema.     Left lower leg: No  tenderness. No edema.  Skin:    General: Skin is warm and dry.     Capillary Refill: Capillary refill takes less than 2 seconds.  Neurological:     General: No focal deficit present.     Mental Status: She is  alert and oriented to person, place, and time. Mental status is at baseline.  Psychiatric:        Mood and Affect: Mood is anxious.     ED Results / Procedures / Treatments   Labs (all labs ordered are listed, but only abnormal results are displayed) Labs Reviewed  BASIC METABOLIC PANEL - Abnormal; Notable for the following components:      Result Value   Chloride 97 (*)    Glucose, Bld 103 (*)    BUN 22 (*)    Calcium 8.8 (*)    All other components within normal limits  CBC - Abnormal; Notable for the following components:   Hemoglobin 11.4 (*)    MCH 25.3 (*)    All other components within normal limits  PREGNANCY, URINE  TROPONIN I (HIGH SENSITIVITY)  TROPONIN I (HIGH SENSITIVITY)    EKG EKG Interpretation Date/Time:  Thursday October 12 2022 17:06:47 EDT Ventricular Rate:  84 PR Interval:  129 QRS Duration:  103 QT Interval:  363 QTC Calculation: 430 R Axis:   39  Text Interpretation: Sinus rhythm Confirmed by Virgina Norfolk [phone]) on 10/12/2022 5:11:36 PM  Radiology DG Chest 2 View  Result Date: 10/12/2022 CLINICAL DATA:  Chest pain EXAM: CHEST - 2 VIEW COMPARISON:  Chest x-ray 08/11/2022 FINDINGS: Sternotomy wires and mediastinal clips are again seen. The heart size and mediastinal contours are within normal limits. Both lungs are clear. The visualized skeletal structures are unremarkable. IMPRESSION: No active cardiopulmonary disease. Electronically Signed   By: Darliss Cheney M.D.   On: 10/12/2022 17:45    Procedures Procedures    Medications Ordered in ED Medications  baclofen (LIORESAL) tablet 10 mg (10 mg Oral Not Given 10/12/22 1835)  morphine (PF) 2 MG/ML injection 2 mg (2 mg Intravenous Given 10/12/22 1750)  ondansetron (ZOFRAN) injection 4 mg (4 mg Intravenous Given 10/12/22 1749)    ED Course/ Medical Decision Making/ A&P                             Medical Decision Making Amount and/or Complexity of Data Reviewed Labs: ordered. Decision-making  details documented in ED Course. Radiology: ordered. Decision-making details documented in ED Course. ECG/medicine tests: ordered. Decision-making details documented in ED Course.  Risk Prescription drug management.   Medical Decision Making:   Sadhana Frater is a 47 y.o. female who presented to the ED today with chest pain detailed above.    Patient's presentation is complicated by their history of multiple comorbidities, family history.  Patient placed on continuous vitals and telemetry monitoring while in ED which was reviewed periodically.  Complete initial physical exam performed, notably the patient  was anxious but in NAD. She had reproducible tenderness to L chest wall. LCTA, RRR. Abdomen soft and nontender.    Reviewed and confirmed nursing documentation for past medical history, family history, social history.    Initial Assessment:   With the patient's presentation of chest pain, the emergent differential diagnosis of chest pain includes: Acute coronary syndrome, pericarditis, aortic dissection, pulmonary embolism, tension pneumothorax, and esophageal rupture. Other urgent/non-acute considerations include, but are not limited to: chronic angina, aortic stenosis, cardiomyopathy, myocarditis,  mitral valve prolapse, pulmonary hypertension, hypertrophic obstructive cardiomyopathy (HOCM), aortic insufficiency, right ventricular hypertrophy, pneumonia, pleuritis, bronchitis, pneumothorax, tumor, gastroesophageal reflux disease (GERD), esophageal spasm, Mallory-Weiss syndrome, peptic ulcer disease, biliary disease, pancreatitis, functional gastrointestinal pain, cervical or thoracic disk disease or arthritis, shoulder arthritis, costochondritis, subacromial bursitis, anxiety or panic attack, herpes zoster, breast disorders, chest wall tumors, thoracic outlet syndrome, mediastinitis.    Initial Plan:  Screening labs including CBC and Metabolic panel to evaluate for infectious or metabolic  etiology of disease.  CXR to evaluate for structural/infectious intrathoracic pathology.  EKG and troponin to evaluate for cardiac pathology Objective evaluation as reviewed   Initial Study Results:   Laboratory  All laboratory results reviewed without evidence of clinically relevant pathology.   Exceptions include: Cl 97, BUN 22, Ca 8.8, Hgb 11.4   EKG EKG was reviewed independently. ST segments without concerns for elevations.   EKG: normal sinus rhythm.   Radiology:  All images reviewed independently. Agree with radiology report at this time.   DG Chest 2 View  Result Date: 10/12/2022 CLINICAL DATA:  Chest pain EXAM: CHEST - 2 VIEW COMPARISON:  Chest x-ray 08/11/2022 FINDINGS: Sternotomy wires and mediastinal clips are again seen. The heart size and mediastinal contours are within normal limits. Both lungs are clear. The visualized skeletal structures are unremarkable. IMPRESSION: No active cardiopulmonary disease. Electronically Signed   By: Darliss Cheney M.D.   On: 10/12/2022 17:45   DG Foot Complete Left  Result Date: 09/23/2022 CLINICAL DATA:  Twisting injury to the left foot.  Pain. EXAM: LEFT FOOT - COMPLETE 3+ VIEW COMPARISON:  None Available. FINDINGS: No fracture.  No bone lesion. Mild narrowing of the first metatarsophalangeal joint with mild subchondral sclerosis and minor marginal osteophyte formation. Remaining joints are normally spaced and aligned. Small plantar calcaneal spur. Soft tissues are unremarkable. IMPRESSION: 1. No fracture or acute finding. 2. Mild first metatarsophalangeal joint osteoarthritis. Electronically Signed   By: Amie Portland M.D.   On: 09/23/2022 10:57     Final Assessment and Plan:   47 year old female presents to the ED complaining of chest pain.  Chest pain is reproducible on the left.  States that it is a sharp pain that is worse with moving the arm.  Suspect musculoskeletal pain.  She does state that she has a history of on and off chest pain.   Notes that she has had multiple recent life stressors.  Suspect that there could be an underlying anxiety component as well.  Workup initiated as above for further assessment.  EKG normal sinus rhythm without acute ST ST changes.  Normal troponin.  Normal chest x-ray.  Reassuring vital signs.  Low suspicion for ACS. PERC negative. Labs without any identifiable cause of patient's symptoms today.  On recheck, she is doing well and pain is well-controlled.  Discussed with patient that I do believe that her pain is musculoskeletal and could also be exacerbated by stress.  Discussed recommendation for close outpatient follow-up and patient agreeable to do so.  Given musculoskeletal component, will send home with baclofen.  Patient expressed understanding of plan.  Strict ED return precautions given, all questions answered, and stable for discharge.   Clinical Impression:  1. Chest wall pain   2. Stress      Discharge           Final Clinical Impression(s) / ED Diagnoses Final diagnoses:  Chest wall pain  Stress    Rx / DC Orders ED Discharge Orders  Ordered    baclofen (LIORESAL) 10 MG tablet  3 times daily        10/12/22 2039              Richardson Dopp 10/12/22 2335    Virgina Norfolk, DO 10/13/22 1639

## 2022-10-12 NOTE — Discharge Instructions (Signed)
Thank you for letting us take care of you today.  Your workup was reassuring.  Your chest x-ray was normal.  Your EKG and cardiac enzymes were also normal.  Your blood work did not reveal a cause of your symptoms.  I suspect that this is combination of musculoskeletal pain in addition to the stress that you were experiencing.  I am prescribing baclofen for home to help with the musculoskeletal pain.  Please follow-up closely with your PCP.  If you continue to have chest pain, they may want you to be seen by a specialist such as a cardiologist for further workup including a stress test.  For new or worsening symptoms, return to the nearest ED for reevaluation.

## 2022-10-12 NOTE — ED Triage Notes (Signed)
Patient arrives by POV c/o chest pain onset of Monday. Reports dealing with lots of stress this week with school and work. Has been taking tylenol all week for it. Also reports nausea. Pain to left side of chest and states it is sharp in nature.

## 2022-10-13 ENCOUNTER — Other Ambulatory Visit (HOSPITAL_BASED_OUTPATIENT_CLINIC_OR_DEPARTMENT_OTHER): Payer: Self-pay

## 2022-10-14 ENCOUNTER — Other Ambulatory Visit (HOSPITAL_BASED_OUTPATIENT_CLINIC_OR_DEPARTMENT_OTHER): Payer: Self-pay

## 2022-10-14 DIAGNOSIS — M542 Cervicalgia: Secondary | ICD-10-CM | POA: Diagnosis not present

## 2022-10-14 DIAGNOSIS — M545 Low back pain, unspecified: Secondary | ICD-10-CM | POA: Diagnosis not present

## 2022-10-14 DIAGNOSIS — Z79899 Other long term (current) drug therapy: Secondary | ICD-10-CM | POA: Diagnosis not present

## 2022-10-14 MED ORDER — BACLOFEN 10 MG PO TABS
10.0000 mg | ORAL_TABLET | Freq: Three times a day (TID) | ORAL | 5 refills | Status: AC | PRN
  Filled 2022-10-14: qty 60, 20d supply, fill #0
  Filled 2022-11-13: qty 60, 20d supply, fill #1
  Filled 2023-01-22: qty 60, 20d supply, fill #2

## 2022-10-14 MED ORDER — OXYCODONE HCL 15 MG PO TABS
15.0000 mg | ORAL_TABLET | Freq: Four times a day (QID) | ORAL | 0 refills | Status: DC | PRN
  Filled 2022-10-14: qty 135, 27d supply, fill #0

## 2022-10-16 ENCOUNTER — Other Ambulatory Visit (HOSPITAL_BASED_OUTPATIENT_CLINIC_OR_DEPARTMENT_OTHER): Payer: Self-pay

## 2022-10-16 ENCOUNTER — Other Ambulatory Visit: Payer: Self-pay

## 2022-10-16 DIAGNOSIS — M25522 Pain in left elbow: Secondary | ICD-10-CM | POA: Diagnosis not present

## 2022-10-16 DIAGNOSIS — M25532 Pain in left wrist: Secondary | ICD-10-CM | POA: Diagnosis not present

## 2022-10-16 DIAGNOSIS — Z79899 Other long term (current) drug therapy: Secondary | ICD-10-CM | POA: Diagnosis not present

## 2022-10-16 DIAGNOSIS — M79642 Pain in left hand: Secondary | ICD-10-CM | POA: Diagnosis not present

## 2022-10-19 NOTE — Telephone Encounter (Signed)
Pt doesn't recall who prescribed it for her. She just know that it was through [facility].

## 2022-10-20 ENCOUNTER — Other Ambulatory Visit (HOSPITAL_BASED_OUTPATIENT_CLINIC_OR_DEPARTMENT_OTHER): Payer: Self-pay

## 2022-10-20 DIAGNOSIS — M793 Panniculitis, unspecified: Secondary | ICD-10-CM | POA: Diagnosis not present

## 2022-10-20 DIAGNOSIS — E039 Hypothyroidism, unspecified: Secondary | ICD-10-CM | POA: Diagnosis not present

## 2022-10-20 DIAGNOSIS — Z7984 Long term (current) use of oral hypoglycemic drugs: Secondary | ICD-10-CM | POA: Diagnosis not present

## 2022-10-20 DIAGNOSIS — I10 Essential (primary) hypertension: Secondary | ICD-10-CM | POA: Diagnosis not present

## 2022-10-20 DIAGNOSIS — E119 Type 2 diabetes mellitus without complications: Secondary | ICD-10-CM | POA: Diagnosis not present

## 2022-10-20 DIAGNOSIS — Z7985 Long-term (current) use of injectable non-insulin antidiabetic drugs: Secondary | ICD-10-CM | POA: Diagnosis not present

## 2022-10-20 DIAGNOSIS — K219 Gastro-esophageal reflux disease without esophagitis: Secondary | ICD-10-CM | POA: Diagnosis not present

## 2022-10-20 DIAGNOSIS — E785 Hyperlipidemia, unspecified: Secondary | ICD-10-CM | POA: Diagnosis not present

## 2022-10-20 DIAGNOSIS — Z79899 Other long term (current) drug therapy: Secondary | ICD-10-CM | POA: Diagnosis not present

## 2022-10-20 DIAGNOSIS — G894 Chronic pain syndrome: Secondary | ICD-10-CM | POA: Diagnosis not present

## 2022-10-20 DIAGNOSIS — G4730 Sleep apnea, unspecified: Secondary | ICD-10-CM | POA: Diagnosis not present

## 2022-10-20 MED ORDER — BACLOFEN 10 MG PO TABS
10.0000 mg | ORAL_TABLET | Freq: Three times a day (TID) | ORAL | 0 refills | Status: DC | PRN
  Filled 2022-10-20 – 2022-10-21 (×2): qty 20, 7d supply, fill #0

## 2022-10-21 ENCOUNTER — Other Ambulatory Visit (HOSPITAL_BASED_OUTPATIENT_CLINIC_OR_DEPARTMENT_OTHER): Payer: Self-pay

## 2022-10-21 DIAGNOSIS — G8929 Other chronic pain: Secondary | ICD-10-CM | POA: Diagnosis not present

## 2022-10-21 DIAGNOSIS — K219 Gastro-esophageal reflux disease without esophagitis: Secondary | ICD-10-CM | POA: Diagnosis not present

## 2022-10-21 DIAGNOSIS — E785 Hyperlipidemia, unspecified: Secondary | ICD-10-CM | POA: Diagnosis not present

## 2022-10-21 DIAGNOSIS — M545 Low back pain, unspecified: Secondary | ICD-10-CM | POA: Diagnosis not present

## 2022-10-21 DIAGNOSIS — Z7985 Long-term (current) use of injectable non-insulin antidiabetic drugs: Secondary | ICD-10-CM | POA: Diagnosis not present

## 2022-10-21 DIAGNOSIS — E7800 Pure hypercholesterolemia, unspecified: Secondary | ICD-10-CM | POA: Diagnosis not present

## 2022-10-21 DIAGNOSIS — G894 Chronic pain syndrome: Secondary | ICD-10-CM | POA: Diagnosis not present

## 2022-10-21 DIAGNOSIS — G4730 Sleep apnea, unspecified: Secondary | ICD-10-CM | POA: Diagnosis not present

## 2022-10-21 DIAGNOSIS — Z7984 Long term (current) use of oral hypoglycemic drugs: Secondary | ICD-10-CM | POA: Diagnosis not present

## 2022-10-21 DIAGNOSIS — E039 Hypothyroidism, unspecified: Secondary | ICD-10-CM | POA: Diagnosis not present

## 2022-10-21 DIAGNOSIS — M793 Panniculitis, unspecified: Secondary | ICD-10-CM | POA: Diagnosis not present

## 2022-10-21 DIAGNOSIS — Z79899 Other long term (current) drug therapy: Secondary | ICD-10-CM | POA: Diagnosis not present

## 2022-10-21 DIAGNOSIS — E119 Type 2 diabetes mellitus without complications: Secondary | ICD-10-CM | POA: Diagnosis not present

## 2022-10-21 DIAGNOSIS — I10 Essential (primary) hypertension: Secondary | ICD-10-CM | POA: Diagnosis not present

## 2022-10-21 MED ORDER — OXYCODONE HCL 5 MG PO TABS
5.0000 mg | ORAL_TABLET | Freq: Four times a day (QID) | ORAL | 0 refills | Status: DC | PRN
  Filled 2022-10-21: qty 20, 5d supply, fill #0

## 2022-10-23 ENCOUNTER — Other Ambulatory Visit: Payer: Self-pay

## 2022-10-23 ENCOUNTER — Other Ambulatory Visit (HOSPITAL_BASED_OUTPATIENT_CLINIC_OR_DEPARTMENT_OTHER): Payer: Self-pay

## 2022-10-24 ENCOUNTER — Other Ambulatory Visit (HOSPITAL_BASED_OUTPATIENT_CLINIC_OR_DEPARTMENT_OTHER): Payer: Self-pay

## 2022-10-25 DIAGNOSIS — Z79899 Other long term (current) drug therapy: Secondary | ICD-10-CM | POA: Diagnosis not present

## 2022-10-26 ENCOUNTER — Telehealth: Payer: Self-pay | Admitting: Internal Medicine

## 2022-10-26 DIAGNOSIS — G8929 Other chronic pain: Secondary | ICD-10-CM

## 2022-10-26 DIAGNOSIS — M797 Fibromyalgia: Secondary | ICD-10-CM

## 2022-10-26 NOTE — Telephone Encounter (Signed)
Referral placed for PT at [facility] with request for aquatic therapy. I would not recommend to start until cleared by her surgeon as they would need to clear her to get in pool.

## 2022-10-26 NOTE — Telephone Encounter (Signed)
Patient has called about her diabetic shoes and form for [employer] as well as wanting to have a referral sent in for [facility].

## 2022-10-26 NOTE — Telephone Encounter (Signed)
Please can a nurse reach out to pt about a referral she is requesting and I advise pt she have to make an appt pt stated she just had a surgery. Please advise.

## 2022-10-26 NOTE — Telephone Encounter (Signed)
Called patient and informed her that her referral has been placed and now will call about her diabetic shoes.

## 2022-10-26 NOTE — Telephone Encounter (Signed)
Papers for the shoes have been fax over to [phone] for [facility] and I have fax confirmation- "Ok"

## 2022-11-01 ENCOUNTER — Other Ambulatory Visit (HOSPITAL_BASED_OUTPATIENT_CLINIC_OR_DEPARTMENT_OTHER): Payer: Self-pay

## 2022-11-01 DIAGNOSIS — S92812A Other fracture of left foot, initial encounter for closed fracture: Secondary | ICD-10-CM | POA: Diagnosis not present

## 2022-11-01 MED ORDER — CELECOXIB 200 MG PO CAPS
200.0000 mg | ORAL_CAPSULE | Freq: Two times a day (BID) | ORAL | 0 refills | Status: AC
  Filled 2022-11-01: qty 28, 14d supply, fill #0
  Filled 2022-11-01: qty 32, 16d supply, fill #0

## 2022-11-04 ENCOUNTER — Other Ambulatory Visit (HOSPITAL_BASED_OUTPATIENT_CLINIC_OR_DEPARTMENT_OTHER): Payer: Self-pay

## 2022-11-06 ENCOUNTER — Telehealth: Payer: Self-pay | Admitting: Internal Medicine

## 2022-11-06 DIAGNOSIS — G7000 Myasthenia gravis without (acute) exacerbation: Secondary | ICD-10-CM

## 2022-11-06 NOTE — Telephone Encounter (Signed)
Patient called to request either a power scooter or a power wheelchair. She said she would like for it to be sent to [employer]. Their number is [phone]. Best callback for patient is [phone].

## 2022-11-07 NOTE — Telephone Encounter (Signed)
Patient called back and would like to be scheduled for a PT evaluation.  Call patient and let her know when this has been scheduled.  Phone:  [phone]

## 2022-11-07 NOTE — Telephone Encounter (Signed)
Referral placed she will be called directly

## 2022-11-07 NOTE — Telephone Encounter (Signed)
The process for insurance to cover this would be to get physical therapy evaluation to see if she qualified and if so which product would be best for her. Then a separate visit is needed with me to go over that and no other conditions can be discussed. Then we would fill out forms from whichever company she desires. Does she want to proceed with PT eval? Whole process typically takes several months if she is desiring insurance to pay for this.

## 2022-11-11 ENCOUNTER — Other Ambulatory Visit (HOSPITAL_BASED_OUTPATIENT_CLINIC_OR_DEPARTMENT_OTHER): Payer: Self-pay

## 2022-11-11 DIAGNOSIS — M545 Low back pain, unspecified: Secondary | ICD-10-CM | POA: Diagnosis not present

## 2022-11-11 DIAGNOSIS — Z79899 Other long term (current) drug therapy: Secondary | ICD-10-CM | POA: Diagnosis not present

## 2022-11-11 DIAGNOSIS — M542 Cervicalgia: Secondary | ICD-10-CM | POA: Diagnosis not present

## 2022-11-11 DIAGNOSIS — G8929 Other chronic pain: Secondary | ICD-10-CM | POA: Diagnosis not present

## 2022-11-11 MED ORDER — OXYCODONE HCL 15 MG PO TABS
15.0000 mg | ORAL_TABLET | Freq: Every day | ORAL | 0 refills | Status: DC | PRN
  Filled 2022-11-13: qty 135, 28d supply, fill #0

## 2022-11-13 ENCOUNTER — Other Ambulatory Visit: Payer: Self-pay

## 2022-11-13 ENCOUNTER — Other Ambulatory Visit (HOSPITAL_BASED_OUTPATIENT_CLINIC_OR_DEPARTMENT_OTHER): Payer: Self-pay

## 2022-11-14 ENCOUNTER — Other Ambulatory Visit (HOSPITAL_BASED_OUTPATIENT_CLINIC_OR_DEPARTMENT_OTHER): Payer: Self-pay

## 2022-11-14 ENCOUNTER — Ambulatory Visit: Payer: 59

## 2022-11-14 NOTE — Progress Notes (Signed)
Patient did not like shoes that were ordered we chose another shoe and placed order to [facility] will call patient when in and she will return for fitting

## 2022-11-15 ENCOUNTER — Other Ambulatory Visit (HOSPITAL_BASED_OUTPATIENT_CLINIC_OR_DEPARTMENT_OTHER): Payer: Self-pay

## 2022-11-15 MED ORDER — ALPRAZOLAM 1 MG PO TABS
1.0000 mg | ORAL_TABLET | Freq: Four times a day (QID) | ORAL | 1 refills | Status: DC
Start: 2022-08-22 — End: 2022-11-21
  Filled 2022-11-15: qty 120, 30d supply, fill #0

## 2022-11-17 ENCOUNTER — Other Ambulatory Visit (HOSPITAL_BASED_OUTPATIENT_CLINIC_OR_DEPARTMENT_OTHER): Payer: Self-pay

## 2022-11-17 MED ORDER — MOUNJARO 5 MG/0.5ML ~~LOC~~ SOAJ
5.0000 mg | SUBCUTANEOUS | 0 refills | Status: DC
  Filled 2022-11-17: qty 2, 28d supply, fill #0

## 2022-11-18 ENCOUNTER — Ambulatory Visit (HOSPITAL_BASED_OUTPATIENT_CLINIC_OR_DEPARTMENT_OTHER): Payer: 59 | Admitting: Physical Therapy

## 2022-11-21 ENCOUNTER — Other Ambulatory Visit (HOSPITAL_COMMUNITY): Payer: Self-pay

## 2022-11-21 ENCOUNTER — Other Ambulatory Visit (HOSPITAL_BASED_OUTPATIENT_CLINIC_OR_DEPARTMENT_OTHER): Payer: Self-pay

## 2022-11-21 MED ORDER — ALPRAZOLAM 1 MG PO TABS
1.0000 mg | ORAL_TABLET | Freq: Four times a day (QID) | ORAL | 2 refills | Status: DC
  Filled 2022-11-21 – 2022-12-13 (×2): qty 120, 30d supply, fill #0
  Filled 2023-01-12 (×2): qty 120, 30d supply, fill #1
  Filled 2023-01-31 – 2023-02-09 (×3): qty 120, 30d supply, fill #2

## 2022-11-21 MED ORDER — AMPHETAMINE-DEXTROAMPHETAMINE 30 MG PO TABS
30.0000 mg | ORAL_TABLET | Freq: Two times a day (BID) | ORAL | 0 refills | Status: AC
Start: 2022-11-21 — End: ?
  Filled 2022-11-21: qty 60, 30d supply, fill #0

## 2022-11-22 ENCOUNTER — Other Ambulatory Visit: Payer: Self-pay

## 2022-11-22 ENCOUNTER — Ambulatory Visit: Payer: 59 | Attending: Internal Medicine | Admitting: Physical Therapy

## 2022-11-23 ENCOUNTER — Ambulatory Visit: Payer: 59 | Admitting: Family Medicine

## 2022-11-24 ENCOUNTER — Encounter: Payer: Self-pay | Admitting: Internal Medicine

## 2022-11-24 ENCOUNTER — Ambulatory Visit (INDEPENDENT_AMBULATORY_CARE_PROVIDER_SITE_OTHER): Payer: 59 | Admitting: Internal Medicine

## 2022-11-24 ENCOUNTER — Other Ambulatory Visit (HOSPITAL_BASED_OUTPATIENT_CLINIC_OR_DEPARTMENT_OTHER): Payer: Self-pay

## 2022-11-24 VITALS — BP 96/70 | Temp 98.2°F | Ht 66.0 in | Wt 207.2 lb

## 2022-11-24 DIAGNOSIS — M79601 Pain in right arm: Secondary | ICD-10-CM | POA: Insufficient documentation

## 2022-11-24 MED ORDER — VALACYCLOVIR HCL 1 G PO TABS: 1000.0000 mg | ORAL_TABLET | Freq: Three times a day (TID) | ORAL | 0 refills | Status: AC

## 2022-11-24 MED ORDER — LIDOCAINE 5 % EX PTCH: 1.0000 | MEDICATED_PATCH | CUTANEOUS | 0 refills | Status: DC

## 2022-11-24 NOTE — Progress Notes (Signed)
   Subjective:   Patient ID: Jade Mathis, female    DOB: 06-15-1975, 47 y.o.   MRN: [id number]  HPI The patient is a 47 YO female coming in for right arm pain new. Some darkening patches of skin. Feels like tingling pain.  Review of Systems  Constitutional:  Positive for activity change.  HENT: Negative.    Eyes: Negative.   Respiratory:  Negative for cough, chest tightness and shortness of breath.   Cardiovascular:  Negative for chest pain, palpitations and leg swelling.  Gastrointestinal:  Negative for abdominal distention, abdominal pain, constipation, diarrhea, nausea and vomiting.  Musculoskeletal:  Positive for arthralgias and myalgias.  Skin: Negative.   Neurological: Negative.   Psychiatric/Behavioral: Negative.      Objective:  Physical Exam Constitutional:      Appearance: She is well-developed.  HENT:     Head: Normocephalic and atraumatic.  Cardiovascular:     Rate and Rhythm: Normal rate and regular rhythm.  Pulmonary:     Effort: Pulmonary effort is normal. No respiratory distress.     Breath sounds: Normal breath sounds. No wheezing or rales.  Abdominal:     General: Bowel sounds are normal. There is no distension.     Palpations: Abdomen is soft.     Tenderness: There is no abdominal tenderness. There is no rebound.  Musculoskeletal:        General: Tenderness present.     Cervical back: Normal range of motion.  Skin:    General: Skin is warm and dry.  Neurological:     Mental Status: She is alert and oriented to person, place, and time.     Coordination: Coordination abnormal.     Vitals:   11/24/22 1617  BP: 96/70  Temp: 98.2 F (36.8 C)  TempSrc: Oral  Weight: 207 lb 3.2 oz (94 kg)  Height: 5\' 6"  (1.676 m)    Assessment & Plan:

## 2022-11-24 NOTE — Patient Instructions (Addendum)
We are sending in lidocaine 5% patches to help with the arm pain.  We have sent in valtrex 1000 mg (also called 1 gm) to take 1 pill 3 times a day for 1 week.  We can increase gabapentin to 2 pills up to 3 times a day to help the pain better.

## 2022-11-24 NOTE — Assessment & Plan Note (Signed)
Possible shingles with darkened patch on the forearm with tingling pain although it is not typical. Try valtrex 1000 mg TID for 7 days and lidoderm 5% patches.

## 2022-11-28 ENCOUNTER — Ambulatory Visit: Payer: 59 | Admitting: Internal Medicine

## 2022-11-29 DIAGNOSIS — S92812D Other fracture of left foot, subsequent encounter for fracture with routine healing: Secondary | ICD-10-CM | POA: Diagnosis not present

## 2022-12-05 ENCOUNTER — Telehealth: Payer: Self-pay | Admitting: Physical Therapy

## 2022-12-05 ENCOUNTER — Ambulatory Visit: Payer: 59 | Admitting: Physical Therapy

## 2022-12-05 DIAGNOSIS — M797 Fibromyalgia: Secondary | ICD-10-CM

## 2022-12-05 DIAGNOSIS — G8929 Other chronic pain: Secondary | ICD-10-CM

## 2022-12-05 NOTE — Therapy (Signed)
[facility] [address] [place], [place], [address] Phone: [phone]   Fax:  [phone]  Patient Details  Name: Jade Mathis MRN: [id number] Date of Birth: [birth year] Referring Provider:  Myrlene Broker, *  Encounter Date: 12/05/2022  Patient arrives to session reporting ongoing shingles. Therapist reviews medical chart and suspected shingles documented. Will require resume orders in order to safely continue per infectious disease protocol. Therapist messaged referring physician and session arrive no charge. Patient requesting both aquatic and wheelchair eval.   Carmelia Bake, PT, DPT 12/05/2022, 8:34 AM  [facility] [address] [place], [place], [address] Phone: [phone]   Fax:  [phone]

## 2022-12-05 NOTE — Telephone Encounter (Signed)
Opened in error.   Maryruth Eve, PT, DPT'

## 2022-12-09 ENCOUNTER — Other Ambulatory Visit (HOSPITAL_BASED_OUTPATIENT_CLINIC_OR_DEPARTMENT_OTHER): Payer: Self-pay

## 2022-12-09 DIAGNOSIS — B0229 Other postherpetic nervous system involvement: Secondary | ICD-10-CM | POA: Diagnosis not present

## 2022-12-09 DIAGNOSIS — M542 Cervicalgia: Secondary | ICD-10-CM | POA: Diagnosis not present

## 2022-12-09 DIAGNOSIS — Z79899 Other long term (current) drug therapy: Secondary | ICD-10-CM | POA: Diagnosis not present

## 2022-12-09 DIAGNOSIS — G8929 Other chronic pain: Secondary | ICD-10-CM | POA: Diagnosis not present

## 2022-12-09 DIAGNOSIS — M545 Low back pain, unspecified: Secondary | ICD-10-CM | POA: Diagnosis not present

## 2022-12-09 MED ORDER — LIDOCAINE 4 % EX PTCH
MEDICATED_PATCH | CUTANEOUS | 5 refills | Status: DC
  Filled 2022-12-09: qty 90, 30d supply, fill #0

## 2022-12-09 MED ORDER — OXYCODONE HCL 15 MG PO TABS
15.0000 mg | ORAL_TABLET | Freq: Every day | ORAL | 0 refills | Status: DC | PRN
  Filled 2022-12-11: qty 150, 30d supply, fill #0

## 2022-12-11 ENCOUNTER — Other Ambulatory Visit (HOSPITAL_BASED_OUTPATIENT_CLINIC_OR_DEPARTMENT_OTHER): Payer: Self-pay

## 2022-12-11 ENCOUNTER — Other Ambulatory Visit: Payer: Self-pay

## 2022-12-13 ENCOUNTER — Other Ambulatory Visit: Payer: Self-pay

## 2022-12-13 ENCOUNTER — Other Ambulatory Visit (HOSPITAL_BASED_OUTPATIENT_CLINIC_OR_DEPARTMENT_OTHER): Payer: Self-pay

## 2022-12-19 ENCOUNTER — Ambulatory Visit (INDEPENDENT_AMBULATORY_CARE_PROVIDER_SITE_OTHER): Payer: 59

## 2022-12-19 DIAGNOSIS — M79675 Pain in left toe(s): Secondary | ICD-10-CM

## 2022-12-19 DIAGNOSIS — M2041 Other hammer toe(s) (acquired), right foot: Secondary | ICD-10-CM | POA: Diagnosis not present

## 2022-12-19 DIAGNOSIS — E0842 Diabetes mellitus due to underlying condition with diabetic polyneuropathy: Secondary | ICD-10-CM

## 2022-12-19 DIAGNOSIS — M2010 Hallux valgus (acquired), unspecified foot: Secondary | ICD-10-CM | POA: Diagnosis not present

## 2022-12-19 DIAGNOSIS — M2042 Other hammer toe(s) (acquired), left foot: Secondary | ICD-10-CM

## 2022-12-19 DIAGNOSIS — M79674 Pain in right toe(s): Secondary | ICD-10-CM

## 2022-12-19 NOTE — Progress Notes (Signed)

## 2022-12-20 ENCOUNTER — Other Ambulatory Visit (HOSPITAL_BASED_OUTPATIENT_CLINIC_OR_DEPARTMENT_OTHER): Payer: Self-pay

## 2022-12-20 MED ORDER — AMPHETAMINE-DEXTROAMPHETAMINE 30 MG PO TABS
30.0000 mg | ORAL_TABLET | Freq: Two times a day (BID) | ORAL | 0 refills | Status: DC
  Filled 2022-12-20: qty 60, 30d supply, fill #0

## 2022-12-21 ENCOUNTER — Other Ambulatory Visit: Payer: 59

## 2022-12-22 ENCOUNTER — Other Ambulatory Visit (HOSPITAL_BASED_OUTPATIENT_CLINIC_OR_DEPARTMENT_OTHER): Payer: Self-pay

## 2022-12-22 DIAGNOSIS — Z8639 Personal history of other endocrine, nutritional and metabolic disease: Secondary | ICD-10-CM | POA: Diagnosis not present

## 2022-12-22 MED ORDER — MOUNJARO 7.5 MG/0.5ML ~~LOC~~ SOAJ
7.5000 mg | SUBCUTANEOUS | 2 refills | Status: DC
  Filled 2022-12-22: qty 2, 28d supply, fill #0

## 2022-12-23 ENCOUNTER — Encounter (HOSPITAL_BASED_OUTPATIENT_CLINIC_OR_DEPARTMENT_OTHER): Payer: Self-pay | Admitting: Emergency Medicine

## 2022-12-23 ENCOUNTER — Emergency Department (HOSPITAL_BASED_OUTPATIENT_CLINIC_OR_DEPARTMENT_OTHER)
Admission: EM | Admit: 2022-12-23 | Discharge: 2022-12-23 | Disposition: A | Discharge status: Home / Self Care | Payer: 59 | Attending: Emergency Medicine | Admitting: Emergency Medicine

## 2022-12-23 ENCOUNTER — Other Ambulatory Visit: Payer: Self-pay

## 2022-12-23 ENCOUNTER — Other Ambulatory Visit (HOSPITAL_BASED_OUTPATIENT_CLINIC_OR_DEPARTMENT_OTHER): Payer: Self-pay

## 2022-12-23 DIAGNOSIS — M79601 Pain in right arm: Secondary | ICD-10-CM | POA: Insufficient documentation

## 2022-12-23 DIAGNOSIS — M79631 Pain in right forearm: Secondary | ICD-10-CM | POA: Diagnosis present

## 2022-12-23 DIAGNOSIS — Z794 Long term (current) use of insulin: Secondary | ICD-10-CM | POA: Insufficient documentation

## 2022-12-23 MED ORDER — ONDANSETRON HCL 4 MG PO TABS
4.0000 mg | ORAL_TABLET | Freq: Once | ORAL | Status: AC
  Administered 2022-12-23: 4 mg via ORAL
  Filled 2022-12-23: qty 1

## 2022-12-23 MED ORDER — OXYCODONE HCL 5 MG PO TABS
5.0000 mg | ORAL_TABLET | Freq: Four times a day (QID) | ORAL | 0 refills | Status: AC | PRN
Start: 2022-12-23 — End: ?

## 2022-12-23 MED ORDER — ONDANSETRON 4 MG PO TBDP
ORAL_TABLET | ORAL | Status: AC
  Filled 2022-12-23: qty 1

## 2022-12-23 MED ORDER — OXYCODONE-ACETAMINOPHEN 5-325 MG PO TABS
1.0000 | ORAL_TABLET | Freq: Once | ORAL | Status: AC
  Administered 2022-12-23: 1 via ORAL
  Filled 2022-12-23: qty 1

## 2022-12-23 MED ORDER — PREDNISONE 10 MG PO TABS: 40.0000 mg | ORAL_TABLET | Freq: Every day | ORAL | 0 refills | Status: AC

## 2022-12-23 MED ORDER — IBUPROFEN 600 MG PO TABS: 600.0000 mg | ORAL_TABLET | Freq: Four times a day (QID) | ORAL | 0 refills | Status: DC | PRN

## 2022-12-23 NOTE — ED Provider Notes (Signed)
[place] EMERGENCY DEPARTMENT AT [facility] Provider Note   CSN: [id number] Arrival date & time: 12/23/22  1644     History  Chief Complaint  Patient presents with   Herpes Zoster    Jade Mathis is a 47 y.o. female presenting to the ED with pain in her right forearm.  Patient ports been ongoing for about 5 weeks.  She reports the pain radiates from her right neck down into her right forearm.  It is constantly present.  She says at times makes her hands feel tingling.  She is at her PCP did put her on Valtrex for potential herpes zoster, although the patient never had a rash or an outbreak, the patient reports that her husband and have active zoster.  She has had no relief of her symptoms.  She does have a PCP appointment coming up but it is the weekend and she says the pain has been intractable.  HPI     Home Medications Prior to Admission medications   Medication Sig Start Date End Date Taking? Authorizing Provider  ibuprofen (ADVIL) 600 MG tablet Take 1 tablet (600 mg total) by mouth every 6 (six) hours as needed for up to 30 doses for moderate pain or mild pain. 12/23/22  Yes Varie Machamer, Kermit Balo, MD  oxyCODONE (ROXICODONE) 5 MG immediate release tablet Take 1 tablet (5 mg total) by mouth every 6 (six) hours as needed for up to 10 doses for severe pain. 12/23/22  Yes Taivon Haroon, Kermit Balo, MD  predniSONE (DELTASONE) 10 MG tablet Take 4 tablets (40 mg total) by mouth daily with breakfast for 5 days. 12/23/22 12/28/22 Yes Preslynn Bier, Kermit Balo, MD  acetaminophen (TYLENOL) 325 MG tablet Take 2 tablets (650 mg total) by mouth every 6 (six) hours as needed. 11/04/21   Gareth Eagle, PA-C  albuterol (VENTOLIN HFA) 108 (90 Base) MCG/ACT inhaler TAKE 2 PUFFS BY MOUTH EVERY 6 HOURS AS NEEDED FOR WHEEZE OR SHORTNESS OF BREATH 12/28/21   Myrlene Broker, MD  ALPRAZolam Prudy Feeler) 1 MG tablet Take 1 tablet (1 mg total) by mouth 4 (four) times daily. 11/21/22      amphetamine-dextroamphetamine (ADDERALL) 30 MG tablet Take 1 tablet by mouth 2 (two) times daily. 12/20/22     Atogepant (QULIPTA) 60 MG TABS Take 60 mg by mouth daily.    [provider]  Atogepant (QULIPTA) 60 MG TABS Take 1 tablet (60 mg total) by mouth daily. Take daily to prevent migraines 10/12/22     azaTHIOprine (IMURAN) 50 MG tablet Take 3 tablets (150 mg total) by mouth in the morning. 08/03/22   Antony Madura, MD  b complex vitamins tablet Take 1 tablet by mouth daily.    [provider]  baclofen (LIORESAL) 10 MG tablet Take 1 tablet (10 mg total) by mouth 3 (three) times daily as needed. Max daily dose: 3 tablets. 10/14/22     Blood Glucose Monitoring Suppl (ACCU-CHEK AVIVA PLUS) w/Device KIT Use to test blood sugar daily 01/24/19   Myrlene Broker, MD  celecoxib (CELEBREX) 200 MG capsule Take 1 capsule (200 mg total) by mouth 2 (two) times daily. 11/01/22     cetirizine (ZYRTEC) 10 MG tablet TAKE 1 TABLET BY MOUTH EVERY DAY 08/17/20   Myrlene Broker, MD  Dihydroergotamine Mesylate HFA (TRUDHESA) 0.725 MG/ACT AERS Place 1 actuation (0.725 mg total) into the nose as directed. The dose may be repeated, if needed, a minimum of 1 hour after the first dose.  Do not use more than 2 doses within a 24-hour period or 3 doses within 7 days. Patient not taking: Reported on 11/24/2022 10/12/22     DULoxetine (CYMBALTA) 60 MG capsule Take 60 mg by mouth daily. 11/13/17   [provider]  ergocalciferol (VITAMIN D2) 1.25 MG (50000 UT) capsule Take 50,000 Units by mouth every Sunday. Patient not taking: Reported on 11/24/2022    [provider]  ergocalciferol (VITAMIN D2) 1.25 MG (50000 UT) capsule Take 1 capsule (50,000 Units total) by mouth once a week. 10/12/22     Fluticasone-Salmeterol (ADVAIR) 100-50 MCG/DOSE AEPB Inhale 1 puff into the lungs 2 (two) times daily. 08/16/20   Mikhail, Nita Sells, DO  furosemide (LASIX) 40 MG tablet Take 2 tablets (80 mg total) by  mouth daily. 10/12/22     gabapentin (NEURONTIN) 300 MG capsule Take 1 capsule (300 mg total) by mouth 3 (three) times daily. 10/12/22     glucose blood (ONETOUCH VERIO) test strip USE AS INSTRUCTED TWICE A DAY 05/15/22   Myrlene Broker, MD  hydrOXYzine (ATARAX) 10 MG tablet Take 1 tablet (10 mg total) by mouth 3 (three) times daily as needed. 10/12/22     ibuprofen (ADVIL) 400 MG tablet Take 1 tablet (400 mg total) by mouth every 6 (six) hours as needed. 06/12/22   Myrlene Broker, MD  Insulin Pen Needle 31G X 5 MM MISC Use daily with insulin pen 07/15/18   Reather Littler, MD  JARDIANCE 25 MG TABS tablet Take 25 mg by mouth daily. 08/01/21   [provider]  levothyroxine (SYNTHROID) 175 MCG tablet Take 150 mcg by mouth daily. 05/26/21   [provider]  lidocaine (LIDOCAINE PAIN RELIEF) 4 % 1 (one) adhesive patch, medicated three times daily, as needed 12/09/22     lidocaine (LIDODERM) 5 % Place 1 patch onto the skin daily. Remove & Discard patch within 12 hours or as directed by MD 11/24/22   Myrlene Broker, MD  linaclotide [facility]) 290 MCG CAPS capsule Take 1 capsule (290 mcg total) by mouth daily. 10/12/22     lisdexamfetamine (VYVANSE) 40 MG capsule Take 1 capsule (40 mg total) by mouth daily. Patient not taking: Reported on 11/24/2022 08/22/22     lisinopril-hydrochlorothiazide (ZESTORETIC) 10-12.5 MG tablet Take 1 tablet by mouth daily. Patient not taking: Reported on 11/24/2022 10/12/22     lubiprostone (AMITIZA) 24 MCG capsule TAKE 1 CAPSULE (24 MCG TOTAL) BY MOUTH 2 (TWO) TIMES DAILY WITH A MEAL. Patient not taking: Reported on 11/24/2022 07/10/22   Myrlene Broker, MD  metFORMIN (GLUCOPHAGE-XR) 500 MG 24 hr tablet Take 1,000 mg by mouth at bedtime.    [provider]  naloxone [facility]) nasal spray 4 mg/0.1 mL Use 1 (one) actuation as directed, incase of opioid overdose   May repeat dose every 2-3 minutes until pt responsive or until EMS arrives. 10/12/22      nystatin (MYCOSTATIN/NYSTOP) powder Apply 1 Application topically 3 (three) times daily. 06/12/22   Myrlene Broker, MD  nystatin-triamcinolone ointment [facility]) Apply 1 Application topically 2 (two) times daily. 07/05/22   Myrlene Broker, MD  ondansetron (ZOFRAN) 4 MG tablet Take 1 tablet (4 mg total) by mouth every 8 (eight) hours as needed for nausea or vomiting. 11/10/21   Myrlene Broker, MD  OneTouch Delica Lancets 33G MISC Use twice a day 05/04/21   Myrlene Broker, MD  oxyCODONE (OXY IR/ROXICODONE) 5 MG immediate release tablet Take 1 tablet (5 mg  total) by mouth 4 (four) times daily as needed for breakthrough pain. 10/21/22     oxyCODONE (ROXICODONE) 15 MG immediate release tablet Take 15 mg by mouth 4 (four) times daily as needed. 11/04/21   [provider]  oxyCODONE (ROXICODONE) 15 MG immediate release tablet Take 1 tablet (15 mg total) by mouth 4 (four) to 5 (five) times daily as needed for pain. Max daily dose: 5 tablets. 10/14/22     oxyCODONE (ROXICODONE) 15 MG immediate release tablet Take 1 tablet (15 mg total) by mouth 4 (four) to 5 (five) times daily as needed for pain. Max daily dose: 5 tablets 11/13/22     oxyCODONE (ROXICODONE) 15 MG immediate release tablet Take 1 tablet (15 mg total) by mouth 5 (five) times daily as needed for pain 12/09/22     pantoprazole (PROTONIX) 40 MG tablet TAKE 1 TABLET BY MOUTH EVERY DAY Patient taking differently: Take 40 mg by mouth daily before breakfast. 10/04/18   Adam Phenix, MD  predniSONE (DELTASONE) 2.5 MG tablet Take 2 tablets (5 mg total) by mouth daily. 08/03/22   Antony Madura, MD  promethazine (PHENERGAN) 12.5 MG tablet Take 1 tablet (12.5 mg total) by mouth every 8 (eight) hours as needed for nausea or vomiting. 11/29/20   Myrlene Broker, MD  pyridostigmine (MESTINON) 60 MG tablet Take 1 tablet (60 mg total) by mouth 4 (four) times daily. 08/03/22   Antony Madura, MD  QUEtiapine (SEROQUEL) 100 MG  tablet Take 1 tablet (100 mg total) by mouth at bedtime. 08/30/21   Rhetta Mura, MD  simethicone (GAS-X) 80 MG chewable tablet Chew 1 tablet (80 mg total) by mouth every 6 (six) hours as needed for flatulence. 07/20/22   Derwood Kaplan, MD  tirzepatide [facility]) 10 MG/0.5ML Pen Inject 10 mg into the skin once a week. 09/12/22     tirzepatide (MOUNJARO) 12.5 MG/0.5ML Pen Inject 12.5 mg into the skin once a week. 09/22/22     tirzepatide (MOUNJARO) 5 MG/0.5ML Pen Inject 5 mg into the skin once a week. 07/24/22     tirzepatide (MOUNJARO) 5 MG/0.5ML Pen Inject 5 mg into the skin once a week. 11/17/22     tirzepatide (MOUNJARO) 7.5 MG/0.5ML Pen Inject 7.5 mg into the skin once a week. 07/24/22     tirzepatide (MOUNJARO) 7.5 MG/0.5ML Pen Inject 7.5 mg into the skin once a week. 12/22/22     topiramate (TOPAMAX) 200 MG tablet Take 200 mg by mouth daily in the afternoon.    [provider]  traZODone (DESYREL) 100 MG tablet Take 100 mg by mouth at bedtime. 10/12/21   [provider]  triamcinolone ointment (KENALOG) 0.5 % Apply 1 Application topically 2 (two) times daily. 07/05/22   Myrlene Broker, MD  Ubrogepant (UBRELVY) 100 MG TABS Take 1 (one) Tablet by mouth as directed, Take at onset of Migraine HA , may repeat dose in 2 hrs . No more than 200mg / 24 hrs 10/12/22     valACYclovir (VALTREX) 500 MG tablet Take 1 tablet (500 mg total) by mouth 2 (two) times daily. 04/27/20   Myrlene Broker, MD  zolmitriptan (ZOMIG) 5 MG tablet Take 1 tablet (5 mg total) by mouth as needed for migraine. 06/12/22   Myrlene Broker, MD      Allergies    Magnesium-containing compounds, Depo-provera [medroxyprogesterone], Other, and Vicodin [hydrocodone-acetaminophen]    Review of Systems   Review of Systems  Physical Exam Updated Vital Signs BP  91/78   Pulse 90   Temp 98.5 F (36.9 C)   Resp 15   SpO2 96%  Physical Exam Constitutional:      General: She is not in acute  distress. HENT:     Head: Normocephalic and atraumatic.  Eyes:     Conjunctiva/sclera: Conjunctivae normal.     Pupils: Pupils are equal, round, and reactive to light.  Cardiovascular:     Rate and Rhythm: Normal rate and regular rhythm.  Pulmonary:     Effort: Pulmonary effort is normal. No respiratory distress.  Abdominal:     General: There is no distension.     Tenderness: There is no abdominal tenderness.  Skin:    General: Skin is warm and dry.  Neurological:     General: No focal deficit present.     Mental Status: She is alert. Mental status is at baseline.     Comments: C5 paresthesias, no motor weakness  Psychiatric:        Mood and Affect: Mood normal.        Behavior: Behavior normal.     ED Results / Procedures / Treatments   Labs (all labs ordered are listed, but only abnormal results are displayed) Labs Reviewed - No data to display  EKG None  Radiology No results found.  Procedures Procedures    Medications Ordered in ED Medications  oxyCODONE-acetaminophen (PERCOCET/ROXICET) 5-325 MG per tablet 1 tablet (has no administration in time range)  ondansetron (ZOFRAN) tablet 4 mg (has no administration in time range)    ED Course/ Medical Decision Making/ A&P                                 Medical Decision Making Risk Prescription drug management.   Patient is presenting to ED with forearm pain and paresthesias.  Differential would include cervical radiculopathy most likely versus peripheral paresthesia versus postherpetic neuralgia versus other  It is not clear to me that this was in fact herpes zoster if she truly had no outbreak of rash.  It is possible still to have shingles without a rash, particularly sick contacts in the house, but her description of having intermittent weakness in her hands makes me wonder more about a nerve injury.  This could be brachial plexopathy or cervical impingement.  She says she does have a history of cervical  impingement.  We can start her on steroids, she will take her first dose tomorrow if she is concerned about problems sleeping if she takes it at this hour.  I have provided a short course of pain medications and she can follow-up with her PCP for this.  Also provided office information from orthopedic clinic for ongoing neck pain.        Final Clinical Impression(s) / ED Diagnoses Final diagnoses:  Pain of right upper extremity    Rx / DC Orders ED Discharge Orders          Ordered    oxyCODONE (ROXICODONE) 5 MG immediate release tablet  Every 6 hours PRN        12/23/22 1837    ibuprofen (ADVIL) 600 MG tablet  Every 6 hours PRN        12/23/22 1837    predniSONE (DELTASONE) 10 MG tablet  Daily with breakfast        12/23/22 1837              Alvester Chou  J, MD 12/23/22 1610

## 2022-12-23 NOTE — Discharge Instructions (Addendum)
May be experiencing a pinched nerve or pain coming from her neck into your arm.  You should follow-up with an orthopedic clinic.

## 2022-12-23 NOTE — ED Triage Notes (Signed)
Shingles to right arm, rash since 5 weeks, the patient took valtrex and finished coarse, with no relief

## 2022-12-25 ENCOUNTER — Other Ambulatory Visit (HOSPITAL_BASED_OUTPATIENT_CLINIC_OR_DEPARTMENT_OTHER): Payer: Self-pay

## 2022-12-26 ENCOUNTER — Ambulatory Visit (INDEPENDENT_AMBULATORY_CARE_PROVIDER_SITE_OTHER): Payer: 59 | Admitting: Physician Assistant

## 2022-12-26 ENCOUNTER — Encounter: Payer: Self-pay | Admitting: Physician Assistant

## 2022-12-26 ENCOUNTER — Other Ambulatory Visit (INDEPENDENT_AMBULATORY_CARE_PROVIDER_SITE_OTHER): Payer: 59

## 2022-12-26 DIAGNOSIS — M5412 Radiculopathy, cervical region: Secondary | ICD-10-CM

## 2022-12-26 DIAGNOSIS — M542 Cervicalgia: Secondary | ICD-10-CM | POA: Diagnosis not present
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
## 2022-12-27 ENCOUNTER — Telehealth: Payer: Self-pay | Admitting: Internal Medicine

## 2022-12-27 ENCOUNTER — Other Ambulatory Visit: Payer: Self-pay

## 2022-12-27 NOTE — Telephone Encounter (Signed)
It look like patient has refills left on this

## 2022-12-27 NOTE — Telephone Encounter (Signed)
Prescription Request  12/27/2022  LOV: 11/24/2022  What is the name of the medication or equipment? Lidocaine patches  Have you contacted your pharmacy to request a refill? Yes   Which pharmacy would you like this sent to?   [facility] [address] [place] [address] Phone: [phone] Fax: [phone]     Patient notified that their request is being sent to the clinical staff for review and that they should receive a response within 2 business days.   Please advise at Mobile [phone] (mobile)

## 2022-12-30 ENCOUNTER — Other Ambulatory Visit (HOSPITAL_BASED_OUTPATIENT_CLINIC_OR_DEPARTMENT_OTHER): Payer: Self-pay

## 2023-01-01 ENCOUNTER — Telehealth: Payer: Self-pay | Admitting: Physician Assistant

## 2023-01-01 NOTE — Telephone Encounter (Signed)
Called patient to schedule mri review after 10/16 left message on voicemail to call back

## 2023-01-02 ENCOUNTER — Encounter: Payer: Self-pay | Admitting: Internal Medicine

## 2023-01-02 ENCOUNTER — Ambulatory Visit: Payer: 59 | Admitting: Internal Medicine

## 2023-01-02 ENCOUNTER — Other Ambulatory Visit: Payer: Self-pay

## 2023-01-02 ENCOUNTER — Other Ambulatory Visit (HOSPITAL_BASED_OUTPATIENT_CLINIC_OR_DEPARTMENT_OTHER): Payer: Self-pay

## 2023-01-02 ENCOUNTER — Other Ambulatory Visit (HOSPITAL_COMMUNITY): Payer: Self-pay

## 2023-01-02 VITALS — BP 114/80 | HR 86 | Temp 98.5°F | Ht 66.0 in | Wt 206.0 lb

## 2023-01-02 DIAGNOSIS — E119 Type 2 diabetes mellitus without complications: Secondary | ICD-10-CM | POA: Diagnosis not present

## 2023-01-02 DIAGNOSIS — I10 Essential (primary) hypertension: Secondary | ICD-10-CM | POA: Diagnosis not present

## 2023-01-02 DIAGNOSIS — Z23 Encounter for immunization: Secondary | ICD-10-CM | POA: Diagnosis not present

## 2023-01-02 DIAGNOSIS — G7000 Myasthenia gravis without (acute) exacerbation: Secondary | ICD-10-CM

## 2023-01-02 DIAGNOSIS — G8929 Other chronic pain: Secondary | ICD-10-CM | POA: Diagnosis not present

## 2023-01-02 DIAGNOSIS — M79604 Pain in right leg: Secondary | ICD-10-CM

## 2023-01-02 DIAGNOSIS — M542 Cervicalgia: Secondary | ICD-10-CM | POA: Diagnosis not present

## 2023-01-02 DIAGNOSIS — F11288 Opioid dependence with other opioid-induced disorder: Secondary | ICD-10-CM

## 2023-01-02 DIAGNOSIS — R030 Elevated blood-pressure reading, without diagnosis of hypertension: Secondary | ICD-10-CM | POA: Diagnosis not present

## 2023-01-02 DIAGNOSIS — Z79899 Other long term (current) drug therapy: Secondary | ICD-10-CM | POA: Diagnosis not present

## 2023-01-02 DIAGNOSIS — M5412 Radiculopathy, cervical region: Secondary | ICD-10-CM

## 2023-01-02 DIAGNOSIS — M545 Low back pain, unspecified: Secondary | ICD-10-CM | POA: Diagnosis not present

## 2023-01-02 DIAGNOSIS — T402X5A Adverse effect of other opioids, initial encounter: Secondary | ICD-10-CM | POA: Diagnosis not present

## 2023-01-02 DIAGNOSIS — R208 Other disturbances of skin sensation: Secondary | ICD-10-CM | POA: Diagnosis not present

## 2023-01-02 DIAGNOSIS — E782 Mixed hyperlipidemia: Secondary | ICD-10-CM | POA: Diagnosis not present

## 2023-01-02 DIAGNOSIS — M79605 Pain in left leg: Secondary | ICD-10-CM | POA: Diagnosis not present

## 2023-01-02 MED ORDER — OXYCODONE HCL 15 MG PO TABS
15.0000 mg | ORAL_TABLET | Freq: Every day | ORAL | 0 refills | Status: DC | PRN
  Filled 2023-01-08: qty 150, 30d supply, fill #0

## 2023-01-02 MED ORDER — LIDOCAINE 5 % EX PTCH: 1.0000 | MEDICATED_PATCH | CUTANEOUS | 11 refills | Status: AC

## 2023-01-02 MED ORDER — PREGABALIN 75 MG PO CAPS: 75.0000 mg | ORAL_CAPSULE | Freq: Two times a day (BID) | ORAL | 0 refills | Status: AC

## 2023-01-02 MED ORDER — LIDOCAINE 4 % EX PTCH: MEDICATED_PATCH | CUTANEOUS | 5 refills | Status: AC

## 2023-01-02 NOTE — Assessment & Plan Note (Signed)
She is on high risk regimen and is asking for opioid prescription today. I am uncomfortable with this and advised her to contact her pain management provider if a change in her chronic pain regimen is needed. 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 She has gone to ER several times due to the severity of the pain and has struggled with controlling that pain.

## 2023-01-02 NOTE — Assessment & Plan Note (Signed)
No flare today. She is having weakness in right arm which is suspected related to cervical radiculopathy not her MG.

## 2023-01-02 NOTE — Patient Instructions (Addendum)
I think you may be having some opioid induced hyperalgesia. This is a problem when your body feels pain a lot more due to being on the pain medicines long term. This can cause sensations to feel unbearably painful. Sometimes a change or reduction of the pain medicine can help with this and I recommend you talk to your pain medicine doctor about if a change could help you.   We will wait on the MRI tomorrow to see if a problem in the neck is causing the pain in the arm.  We have refilled the lidocaine patches. We have sent in lyrica to start taking 1 pill twice a day. Let us know in 1-2 weeks how this is doing. Do not take gabapentin while taking lyrica.

## 2023-01-02 NOTE — Assessment & Plan Note (Signed)
She is asking about shingles in the arm but I do not suspect this. We did try empiric treatment about 1 month ago which did not help symptoms. I suspect she did not have shingles and would consider this pain all related to the radiculopathy as well as opioid induced hyperalgesia.

## 2023-01-02 NOTE — Progress Notes (Signed)
Subjective:   Patient ID: Jade Mathis, female    DOB: [birth year], 47 y.o.   MRN: [id number]  HPI The patient is a 47 YO female coming in for ongoing right arm pain. Originally someone had thought she could have shingles. It was not typical but we did treatment with valtrex and lidocaine patches. This did not change/improve symptoms. She has since seen ER and orthopedics. They are getting MRI of neck tomorrow to assess with some weakness in the arm. She is in unbearable pain.   Review of Systems  Constitutional:  Positive for activity change and fatigue.  HENT: Negative.    Eyes: Negative.   Respiratory:  Positive for shortness of breath. Negative for cough and chest tightness.   Cardiovascular:  Negative for chest pain, palpitations and leg swelling.  Gastrointestinal:  Positive for abdominal pain. Negative for abdominal distention, constipation, diarrhea, nausea and vomiting.  Musculoskeletal:  Positive for arthralgias, back pain, gait problem and myalgias.  Skin: Negative.   Neurological:  Positive for headaches.  Psychiatric/Behavioral: Negative.      Objective:  Physical Exam Constitutional:      Appearance: She is well-developed. She is ill-appearing.  HENT:     Head: Normocephalic and atraumatic.  Cardiovascular:     Rate and Rhythm: Normal rate and regular rhythm.  Pulmonary:     Effort: Pulmonary effort is normal. No respiratory distress.     Breath sounds: Normal breath sounds. No wheezing or rales.  Abdominal:     General: Bowel sounds are normal. There is no distension.     Palpations: Abdomen is soft.     Tenderness: There is no abdominal tenderness. There is no rebound.  Musculoskeletal:        General: Tenderness present.     Cervical back: Normal range of motion.  Skin:    General: Skin is warm and dry.  Neurological:     Mental Status: She is alert and oriented to person, place, and time.     Motor: Weakness present.     Coordination: Coordination  abnormal.     Vitals:   01/02/23 0811  BP: 114/80  Pulse: 86  Temp: 98.5 F (36.9 C)  TempSrc: Oral  SpO2: 98%  Weight: 206 lb (93.4 kg)  Height: 5\' 6"  (1.676 m)    Assessment & Plan:  Flu shot given at visit

## 2023-01-02 NOTE — Assessment & Plan Note (Signed)
I suspect this is playing a large role in the severe pain she is experiencing. She is asking me to prescribe oxycodone today on top of her pain management regimen. I will not do this and informed her that since she is on pain contract I would not prescribe opioids for her. I have discussed OIH with her today and suspect that she needs change or decrease in opioids to help the hyperalgesia pass. She is using ibuprofen, cymbalta. She has access to baclofen and gabapentin (she is not taking this due to feeling it did not help with pain). She will stop gabapentin and start lyrica 75 mg BID. She is having MRI tomorrow to assess for cause of right arm pain and weakness. She is on chronic steroids for her MG. Refilled lidocaine patches today and we do not have options for additional agents and she has significant polypharmacy.

## 2023-01-02 NOTE — Assessment & Plan Note (Signed)
She states that this is worse lately as well. She is walking with cane and having MRI cervical spine tomorrow.

## 2023-01-03 ENCOUNTER — Ambulatory Visit (HOSPITAL_COMMUNITY)
Admission: RE | Admit: 2023-01-03 | Discharge: 2023-01-03 | Disposition: A | Discharge status: Home / Self Care | Payer: 59 | Source: Ambulatory Visit | Attending: Physician Assistant | Admitting: Physician Assistant

## 2023-01-03 DIAGNOSIS — M542 Cervicalgia: Secondary | ICD-10-CM | POA: Diagnosis not present

## 2023-01-03 DIAGNOSIS — M4722 Other spondylosis with radiculopathy, cervical region: Secondary | ICD-10-CM | POA: Diagnosis not present

## 2023-01-04 ENCOUNTER — Other Ambulatory Visit: Payer: Self-pay | Admitting: Internal Medicine

## 2023-01-08 ENCOUNTER — Other Ambulatory Visit: Payer: Self-pay

## 2023-01-08 ENCOUNTER — Other Ambulatory Visit (HOSPITAL_BASED_OUTPATIENT_CLINIC_OR_DEPARTMENT_OTHER): Payer: Self-pay

## 2023-01-08 DIAGNOSIS — Z79899 Other long term (current) drug therapy: Secondary | ICD-10-CM | POA: Diagnosis not present

## 2023-01-11 ENCOUNTER — Other Ambulatory Visit (HOSPITAL_BASED_OUTPATIENT_CLINIC_OR_DEPARTMENT_OTHER): Payer: Self-pay

## 2023-01-11 DIAGNOSIS — E1140 Type 2 diabetes mellitus with diabetic neuropathy, unspecified: Secondary | ICD-10-CM | POA: Diagnosis not present

## 2023-01-11 DIAGNOSIS — E039 Hypothyroidism, unspecified: Secondary | ICD-10-CM | POA: Diagnosis not present

## 2023-01-12 ENCOUNTER — Other Ambulatory Visit: Payer: Self-pay

## 2023-01-12 ENCOUNTER — Ambulatory Visit: Payer: 59 | Admitting: Physician Assistant

## 2023-01-12 ENCOUNTER — Other Ambulatory Visit (HOSPITAL_BASED_OUTPATIENT_CLINIC_OR_DEPARTMENT_OTHER): Payer: Self-pay

## 2023-01-15 ENCOUNTER — Other Ambulatory Visit (HOSPITAL_BASED_OUTPATIENT_CLINIC_OR_DEPARTMENT_OTHER): Payer: Self-pay

## 2023-01-15 ENCOUNTER — Ambulatory Visit (INDEPENDENT_AMBULATORY_CARE_PROVIDER_SITE_OTHER): Payer: 59 | Admitting: Physician Assistant

## 2023-01-15 DIAGNOSIS — M79601 Pain in right arm: Secondary | ICD-10-CM | POA: Diagnosis not present
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
## 2023-01-16 ENCOUNTER — Ambulatory Visit (INDEPENDENT_AMBULATORY_CARE_PROVIDER_SITE_OTHER): Payer: 59 | Admitting: Internal Medicine

## 2023-01-16 ENCOUNTER — Encounter: Payer: Self-pay | Admitting: Internal Medicine

## 2023-01-16 ENCOUNTER — Other Ambulatory Visit (HOSPITAL_BASED_OUTPATIENT_CLINIC_OR_DEPARTMENT_OTHER): Payer: Self-pay

## 2023-01-16 VITALS — BP 112/74 | HR 85 | Temp 98.9°F | Ht 66.0 in | Wt 208.0 lb

## 2023-01-16 DIAGNOSIS — M79601 Pain in right arm: Secondary | ICD-10-CM | POA: Diagnosis not present

## 2023-01-16 DIAGNOSIS — R21 Rash and other nonspecific skin eruption: Secondary | ICD-10-CM | POA: Diagnosis not present

## 2023-01-16 MED ORDER — NYSTATIN 100000 UNIT/GM EX POWD
1.0000 | Freq: Three times a day (TID) | CUTANEOUS | 11 refills | Status: AC
  Filled 2023-01-16: qty 60, 30d supply, fill #0
  Filled 2023-03-27: qty 60, 30d supply, fill #1

## 2023-01-16 MED ORDER — CLOTRIMAZOLE-BETAMETHASONE 1-0.05 % EX CREA
TOPICAL_CREAM | Freq: Two times a day (BID) | CUTANEOUS | 6 refills | Status: AC
  Filled 2023-01-16: qty 90, 30d supply, fill #0
  Filled 2023-03-27: qty 90, 30d supply, fill #1

## 2023-01-16 NOTE — Progress Notes (Unsigned)
Subjective:   Patient ID: Jade Mathis, female    DOB: Apr 05, 1975, 47 y.o.   MRN: [id number]  HPI The patient is a 47 YO female coming in for recurrent rash under breast and around side. She had previously creams for this which she ran out of. Did not check for refills. Still struggling significantly with pain in right arm and orthopedics is ordering another test as the MRI did not show a clear source.   Review of Systems  Constitutional:  Positive for activity change.  HENT: Negative.    Eyes: Negative.   Respiratory:  Negative for cough, chest tightness and shortness of breath.   Cardiovascular:  Negative for chest pain, palpitations and leg swelling.  Gastrointestinal:  Positive for abdominal pain. Negative for abdominal distention, constipation, diarrhea, nausea and vomiting.  Musculoskeletal:  Positive for arthralgias and myalgias.  Skin: Negative.   Neurological: Negative.   Psychiatric/Behavioral: Negative.      Objective:  Physical Exam Constitutional:      Appearance: She is well-developed.  HENT:     Head: Normocephalic and atraumatic.  Cardiovascular:     Rate and Rhythm: Normal rate and regular rhythm.  Pulmonary:     Effort: Pulmonary effort is normal. No respiratory distress.     Breath sounds: Normal breath sounds. No wheezing or rales.  Abdominal:     General: Bowel sounds are normal. There is no distension.     Palpations: Abdomen is soft.     Tenderness: There is no abdominal tenderness. There is no rebound.  Musculoskeletal:        General: Tenderness present.     Cervical back: Normal range of motion.  Skin:    General: Skin is warm and dry.     Findings: Rash present.  Neurological:     Mental Status: She is alert and oriented to person, place, and time.     Coordination: Coordination normal.     Vitals:   01/16/23 1411  BP: 112/74  Pulse: 85  Temp: 98.9 F (37.2 C)  TempSrc: Oral  SpO2: 99%  Weight: 208 lb (94.3 kg)  Height: 5\' 6"   (1.676 m)    Assessment & Plan:

## 2023-01-16 NOTE — Patient Instructions (Signed)
We have sent in the powder and the ointment to use twice a day on the rash.

## 2023-01-18 NOTE — Assessment & Plan Note (Signed)
Listened to update about condition and she will have recommended next test from orthopedics.

## 2023-01-18 NOTE — Assessment & Plan Note (Signed)
Rx clomtrimazole/betamethasone as this appears to have coverage for rash as well as nystatin powder. Counseled on moisture removal methods.

## 2023-01-22 ENCOUNTER — Other Ambulatory Visit (HOSPITAL_BASED_OUTPATIENT_CLINIC_OR_DEPARTMENT_OTHER): Payer: Self-pay

## 2023-01-24 ENCOUNTER — Ambulatory Visit (INDEPENDENT_AMBULATORY_CARE_PROVIDER_SITE_OTHER): Payer: 59 | Admitting: Physical Medicine and Rehabilitation

## 2023-01-24 ENCOUNTER — Encounter: Payer: Self-pay | Admitting: Physical Medicine and Rehabilitation

## 2023-01-24 DIAGNOSIS — R531 Weakness: Secondary | ICD-10-CM

## 2023-01-24 DIAGNOSIS — M25511 Pain in right shoulder: Secondary | ICD-10-CM

## 2023-01-24 DIAGNOSIS — G894 Chronic pain syndrome: Secondary | ICD-10-CM

## 2023-01-24 DIAGNOSIS — G8929 Other chronic pain: Secondary | ICD-10-CM

## 2023-01-24 DIAGNOSIS — M542 Cervicalgia: Secondary | ICD-10-CM

## 2023-01-24 DIAGNOSIS — R202 Paresthesia of skin: Secondary | ICD-10-CM | POA: Diagnosis not present

## 2023-01-24 DIAGNOSIS — M79601 Pain in right arm: Secondary | ICD-10-CM | POA: Diagnosis not present
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
## 2023-01-28 NOTE — Procedures (Unsigned)
EMG & NCV Findings: Evaluation of the right median (across palm) sensory nerve showed no response (Palm).  All remaining nerves (as indicated in the following tables) were within normal limits.    All examined muscles (as indicated in the following table) showed no evidence of electrical instability.    Impression: Essentially NORMAL electrodiagnostic study of the right upper limb.  There is no significant electrodiagnostic evidence of nerve entrapment, brachial plexopathy or cervical radiculopathy.    As you know, purely sensory or demyelinating radiculopathies and chemical radiculitis may not be detected with this particular electrodiagnostic study.  Recommendations: 1.  Follow-up with referring physician. 2.  Continue current management of symptoms.  ___________________________ Jade Mathis FAAPMR Board Certified, American Board of Physical Medicine and Rehabilitation    Nerve Conduction Studies Anti Sensory Summary Table   Stim Site NR Peak (ms) Norm Peak (ms) P-T Amp (V) Norm P-T Amp Site1 Site2 Delta-P (ms) Dist (cm) Vel (m/s) Norm Vel (m/s)  Right Median Acr Palm Anti Sensory (2nd Digit)  30.1C  Wrist    3.3 <3.6 39.9 >10 Wrist Palm  0.0    Palm *NR  <2.0          Right Radial Anti Sensory (Base 1st Digit)  30.7C  Wrist    2.2 <3.1 27.0  Wrist Base 1st Digit 2.2 0.0    Right Ulnar Anti Sensory (5th Digit)  30.1C  Wrist    3.3 <3.7 28.3 >15.0 Wrist 5th Digit 3.3 14.0 42 >38   Motor Summary Table   Stim Site NR Onset (ms) Norm Onset (ms) O-P Amp (mV) Norm O-P Amp Site1 Site2 Delta-0 (ms) Dist (cm) Vel (m/s) Norm Vel (m/s)  Right Median Motor (Abd Poll Brev)  31.1C  Wrist    3.1 <4.2 7.6 >5 Elbow Wrist 4.6 24.0 52 >50  Elbow    7.7  7.5         Right Ulnar Motor (Abd Dig Min)  31.7C  Wrist    3.0 <4.2 3.9 >3 B Elbow Wrist 4.3 23.0 53 >53  B Elbow    7.3  3.6  A Elbow B Elbow 1.6 11.0 69 >53  A Elbow    8.9  3.7          EMG   Side Muscle Nerve Root Ins Act Fibs  Psw Amp Dur Poly Recrt Int Jade Mathis Comment  Right Abd Poll Brev Median C8-T1 Nml Nml Nml Nml Nml 0 Nml Nml   Right 1stDorInt Ulnar C8-T1 Nml Nml Nml Nml Nml 0 Nml Nml   Right PronatorTeres Median C6-7 Nml Nml Nml Nml Nml 0 Nml Nml   Right Biceps Musculocut C5-6 Nml Nml Nml Nml Nml 0 Nml Nml   Right Deltoid Axillary C5-6 Nml Nml Nml Nml Nml 0 Nml Nml     Nerve Conduction Studies Anti Sensory Left/Right Comparison   Stim Site L Lat (ms) R Lat (ms) L-R Lat (ms) L Amp (V) R Amp (V) L-R Amp (%) Site1 Site2 L Vel (m/s) R Vel (m/s) L-R Vel (m/s)  Median Acr Palm Anti Sensory (2nd Digit)  30.1C  Wrist  3.3   39.9  Wrist Palm     Palm             Radial Anti Sensory (Base 1st Digit)  30.7C  Wrist  2.2   27.0  Wrist Base 1st Digit     Ulnar Anti Sensory (5th Digit)  30.1C  Wrist  3.3   28.3  Wrist  5th Digit  42    Motor Left/Right Comparison   Stim Site L Lat (ms) R Lat (ms) L-R Lat (ms) L Amp (mV) R Amp (mV) L-R Amp (%) Site1 Site2 L Vel (m/s) R Vel (m/s) L-R Vel (m/s)  Median Motor (Abd Poll Brev)  31.1C  Wrist  3.1   7.6  Elbow Wrist  52   Elbow  7.7   7.5        Ulnar Motor (Abd Dig Min)  31.7C  Wrist  3.0   3.9  B Elbow Wrist  53   B Elbow  7.3   3.6  A Elbow B Elbow  69   A Elbow  8.9   3.7           Waveforms:

## 2023-01-29 ENCOUNTER — Telehealth: Payer: Self-pay | Admitting: Internal Medicine

## 2023-01-29 NOTE — Telephone Encounter (Signed)
Patient would like to have a chest xray done because of her TB - she needs one every 10 years.  Last one was 2013 - she needs this for work.  Please call patient and let her know when this has been ordered.  [phone]

## 2023-01-29 NOTE — Telephone Encounter (Signed)
Please advise 

## 2023-01-29 NOTE — Telephone Encounter (Signed)
I am unclear what this message means. I do not have any information about a history of TB for her or latent TB. Our last TB testing was a quantiferon gold in 2021 and was negative for any past exposure to TB. Can she clarify?

## 2023-01-30 ENCOUNTER — Other Ambulatory Visit (HOSPITAL_BASED_OUTPATIENT_CLINIC_OR_DEPARTMENT_OTHER): Payer: Self-pay

## 2023-01-30 DIAGNOSIS — Z111 Encounter for screening for respiratory tuberculosis: Secondary | ICD-10-CM

## 2023-01-31 ENCOUNTER — Other Ambulatory Visit: Payer: Self-pay

## 2023-01-31 ENCOUNTER — Other Ambulatory Visit (HOSPITAL_BASED_OUTPATIENT_CLINIC_OR_DEPARTMENT_OTHER): Payer: Self-pay

## 2023-01-31 MED ORDER — AMPHETAMINE-DEXTROAMPHETAMINE 30 MG PO TABS
30.0000 mg | ORAL_TABLET | Freq: Two times a day (BID) | ORAL | 0 refills | Status: DC
  Filled 2023-01-31: qty 60, 30d supply, fill #0

## 2023-01-31 NOTE — Telephone Encounter (Signed)
Please place order for patient to have this done and I can inform her.

## 2023-02-01 NOTE — Progress Notes (Deleted)
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 significant neuroforaminal narrowing.   Upper chest: Negative.   Other: None   IMPRESSION: 1. No acute intracranial abnormality. No calvarial fracture. 2. Stable right anterior cranial fossa meningocele. 3. No acute fracture or listhesis of the cervical spine.   MRI brain w/wo contrast (09/06/21): FINDINGS: Motion artifact is present.   Brain: There is no acute infarction or intracranial hemorrhage. There is no intracranial mass, mass effect, or edema. There is no hydrocephalus or extra-axial fluid collection. Ventricles and sulci are within normal limits in size and configuration. As before, there is abnormal appearance of the anterior right frontal lobe with a prominent sulcus extending inferiorly toward the frontal sinus where there is a osseous defect on CT imaging. No abnormal enhancement.   Vascular: Major vessel flow voids at the skull base are preserved.   Skull and upper cervical spine: Normal  marrow signal is preserved.   Sinuses/Orbits: Patchy mucosal thickening. Right frontoethmoid fibroosseous lesion. Orbits are unremarkable.   Other: Sella is "empty."  Mastoid air cells are clear.   IMPRESSION: No acute abnormality or significant change since 2019.   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  ASSESSMENT: This is Jade Mathis, a 47 y.o. female with:  ***  Plan: ***  Return to clinic in ***  Total time spent reviewing records, interview, history/exam, documentation, and coordination of care on day of encounter:  *** min  Jacquelyne Balint, MD

## 2023-02-07 ENCOUNTER — Other Ambulatory Visit: Payer: Self-pay

## 2023-02-07 ENCOUNTER — Encounter (HOSPITAL_BASED_OUTPATIENT_CLINIC_OR_DEPARTMENT_OTHER): Payer: Self-pay

## 2023-02-07 ENCOUNTER — Other Ambulatory Visit (HOSPITAL_BASED_OUTPATIENT_CLINIC_OR_DEPARTMENT_OTHER): Payer: Self-pay

## 2023-02-07 ENCOUNTER — Other Ambulatory Visit: Payer: Self-pay | Admitting: Physician Assistant

## 2023-02-07 DIAGNOSIS — M79601 Pain in right arm: Secondary | ICD-10-CM

## 2023-02-07 DIAGNOSIS — M545 Low back pain, unspecified: Secondary | ICD-10-CM | POA: Diagnosis not present

## 2023-02-07 DIAGNOSIS — G8929 Other chronic pain: Secondary | ICD-10-CM | POA: Diagnosis not present

## 2023-02-07 DIAGNOSIS — Z79899 Other long term (current) drug therapy: Secondary | ICD-10-CM | POA: Diagnosis not present

## 2023-02-07 DIAGNOSIS — M542 Cervicalgia: Secondary | ICD-10-CM | POA: Diagnosis not present

## 2023-02-07 DIAGNOSIS — B0229 Other postherpetic nervous system involvement: Secondary | ICD-10-CM | POA: Diagnosis not present

## 2023-02-07 MED ORDER — TOPIRAMATE 50 MG PO TABS
50.0000 mg | ORAL_TABLET | Freq: Two times a day (BID) | ORAL | 1 refills | Status: DC
  Filled 2023-02-07: qty 60, 30d supply, fill #0
  Filled 2023-03-07 (×2): qty 60, 30d supply, fill #1

## 2023-02-07 MED ORDER — PHENTERMINE HCL 37.5 MG PO TABS
37.5000 mg | ORAL_TABLET | Freq: Every day | ORAL | 1 refills | Status: DC
  Filled 2023-02-07: qty 30, 30d supply, fill #0
  Filled 2023-03-07 – 2023-03-08 (×2): qty 30, 30d supply, fill #1

## 2023-02-07 MED ORDER — OXYCODONE HCL 15 MG PO TABS
15.0000 mg | ORAL_TABLET | Freq: Every day | ORAL | 0 refills | Status: DC | PRN
  Filled 2023-02-07: qty 150, 30d supply, fill #0

## 2023-02-08 ENCOUNTER — Encounter (HOSPITAL_BASED_OUTPATIENT_CLINIC_OR_DEPARTMENT_OTHER): Payer: Self-pay | Admitting: Pharmacist

## 2023-02-08 ENCOUNTER — Other Ambulatory Visit (HOSPITAL_BASED_OUTPATIENT_CLINIC_OR_DEPARTMENT_OTHER): Payer: Self-pay

## 2023-02-08 ENCOUNTER — Other Ambulatory Visit: Payer: 59

## 2023-02-08 DIAGNOSIS — Z111 Encounter for screening for respiratory tuberculosis: Secondary | ICD-10-CM

## 2023-02-09 ENCOUNTER — Ambulatory Visit: Payer: 59 | Admitting: Neurology

## 2023-02-09 ENCOUNTER — Other Ambulatory Visit: Payer: Self-pay

## 2023-02-11 LAB — QUANTIFERON-TB GOLD PLUS
Mitogen-NIL: >10 [IU]/mL
NIL: 0.02 [IU]/mL
QuantiFERON-TB Gold Plus: NEGATIVE
TB1-NIL: 0.06 [IU]/mL
TB2-NIL: 0.12 [IU]/mL

## 2023-02-13 NOTE — Telephone Encounter (Signed)
Error

## 2023-02-20 ENCOUNTER — Encounter: Payer: Self-pay | Admitting: Neurology

## 2023-02-21 ENCOUNTER — Encounter: Payer: 59 | Admitting: Obstetrics & Gynecology

## 2023-02-21 ENCOUNTER — Ambulatory Visit: Payer: 59 | Admitting: Neurology

## 2023-02-21 NOTE — Progress Notes (Unsigned)
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 Normal.   Skull base and vertebrae: No acute fracture. No primary bone lesion or focal pathologic process.   Soft tissues and spinal canal: No prevertebral fluid or swelling. No visible canal hematoma.   Disc levels: Intervertebral disc heights are preserved. Prevertebral soft tissues are not thickened on sagittal reformats. Spinal canal is widely patent. No significant neuroforaminal narrowing.   Upper chest: Negative.   Other: None   IMPRESSION: 1. No acute intracranial abnormality. No calvarial fracture. 2. Stable right anterior cranial fossa meningocele. 3. No acute fracture or listhesis of the cervical spine.   MRI brain w/wo contrast (09/06/21): FINDINGS: Motion artifact is present.   Brain: There is no acute infarction or intracranial hemorrhage. There is no intracranial mass, mass effect, or edema. There is no hydrocephalus or extra-axial fluid collection. Ventricles and sulci are within  normal limits in size and configuration. As before, there is abnormal appearance of the anterior right frontal lobe with a prominent sulcus extending inferiorly toward the frontal sinus where there is a osseous defect on CT imaging. No abnormal enhancement.   Vascular: Major vessel flow voids at the skull base are preserved.   Skull and upper cervical spine: Normal marrow signal is preserved.   Sinuses/Orbits: Patchy mucosal thickening. Right frontoethmoid fibroosseous lesion. Orbits are unremarkable.   Other: Sella is "empty."  Mastoid air cells are clear.   IMPRESSION: No acute abnormality or significant change since 2019.   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  ASSESSMENT: This is [name], a 47 y.o. female with:  ***  Plan: ***  Return to clinic in ***  Total time spent reviewing records, interview, history/exam, documentation, and coordination of care on day of encounter:  *** min  Jacquelyne Balint, MD

## 2023-02-22 DIAGNOSIS — E039 Hypothyroidism, unspecified: Secondary | ICD-10-CM | POA: Diagnosis not present

## 2023-03-03 ENCOUNTER — Other Ambulatory Visit (HOSPITAL_BASED_OUTPATIENT_CLINIC_OR_DEPARTMENT_OTHER): Payer: Self-pay

## 2023-03-03 DIAGNOSIS — G8929 Other chronic pain: Secondary | ICD-10-CM | POA: Diagnosis not present

## 2023-03-03 DIAGNOSIS — M542 Cervicalgia: Secondary | ICD-10-CM | POA: Diagnosis not present

## 2023-03-03 DIAGNOSIS — B0229 Other postherpetic nervous system involvement: Secondary | ICD-10-CM | POA: Diagnosis not present

## 2023-03-03 DIAGNOSIS — Z79899 Other long term (current) drug therapy: Secondary | ICD-10-CM | POA: Diagnosis not present

## 2023-03-03 DIAGNOSIS — M545 Low back pain, unspecified: Secondary | ICD-10-CM | POA: Diagnosis not present

## 2023-03-03 MED ORDER — OXYCODONE HCL 15 MG PO TABS
15.0000 mg | ORAL_TABLET | Freq: Every day | ORAL | 0 refills | Status: DC
Start: 2023-03-03 — End: 2024-01-01
  Filled 2023-03-03: qty 150, 30d supply, fill #0
  Filled 2023-03-07: qty 30, 6d supply, fill #0
  Filled 2023-03-07: qty 150, 30d supply, fill #0
  Filled 2023-03-07: qty 120, 24d supply, fill #0

## 2023-03-06 ENCOUNTER — Other Ambulatory Visit (HOSPITAL_BASED_OUTPATIENT_CLINIC_OR_DEPARTMENT_OTHER): Payer: Self-pay

## 2023-03-06 DIAGNOSIS — Z79899 Other long term (current) drug therapy: Secondary | ICD-10-CM | POA: Diagnosis not present

## 2023-03-06 MED ORDER — ALPRAZOLAM 1 MG PO TABS
1.0000 mg | ORAL_TABLET | Freq: Four times a day (QID) | ORAL | 2 refills | Status: DC
  Filled 2023-03-06 – 2023-03-09 (×2): qty 120, 30d supply, fill #0
  Filled 2023-04-09: qty 120, 30d supply, fill #1
  Filled 2023-05-07: qty 120, 30d supply, fill #2
  Filled ????-??-??: fill #0

## 2023-03-06 MED ORDER — AMPHETAMINE-DEXTROAMPHETAMINE 30 MG PO TABS
30.0000 mg | ORAL_TABLET | Freq: Two times a day (BID) | ORAL | 0 refills | Status: DC
  Filled 2023-03-06: qty 60, 30d supply, fill #0

## 2023-03-07 ENCOUNTER — Other Ambulatory Visit (HOSPITAL_BASED_OUTPATIENT_CLINIC_OR_DEPARTMENT_OTHER): Payer: Self-pay

## 2023-03-08 ENCOUNTER — Other Ambulatory Visit (HOSPITAL_BASED_OUTPATIENT_CLINIC_OR_DEPARTMENT_OTHER): Payer: Self-pay

## 2023-03-09 ENCOUNTER — Other Ambulatory Visit (HOSPITAL_BASED_OUTPATIENT_CLINIC_OR_DEPARTMENT_OTHER): Payer: Self-pay

## 2023-03-09 ENCOUNTER — Other Ambulatory Visit: Payer: Self-pay

## 2023-03-22 DIAGNOSIS — E119 Type 2 diabetes mellitus without complications: Secondary | ICD-10-CM | POA: Diagnosis not present

## 2023-03-22 DIAGNOSIS — K589 Irritable bowel syndrome without diarrhea: Secondary | ICD-10-CM | POA: Diagnosis not present

## 2023-03-22 DIAGNOSIS — K219 Gastro-esophageal reflux disease without esophagitis: Secondary | ICD-10-CM | POA: Diagnosis not present

## 2023-03-22 DIAGNOSIS — Z9889 Other specified postprocedural states: Secondary | ICD-10-CM | POA: Diagnosis not present

## 2023-03-27 ENCOUNTER — Other Ambulatory Visit (HOSPITAL_BASED_OUTPATIENT_CLINIC_OR_DEPARTMENT_OTHER): Payer: Self-pay

## 2023-03-27 MED ORDER — PHENTERMINE HCL 37.5 MG PO TABS
37.5000 mg | ORAL_TABLET | Freq: Every day | ORAL | 1 refills | Status: DC
Start: 2023-03-27 — End: 2023-06-06
  Filled 2023-03-27 – 2023-04-09 (×2): qty 30, 30d supply, fill #0
  Filled 2023-05-07: qty 30, 30d supply, fill #1

## 2023-03-27 MED ORDER — TOPIRAMATE 50 MG PO TABS
50.0000 mg | ORAL_TABLET | Freq: Two times a day (BID) | ORAL | 1 refills | Status: DC
  Filled 2023-03-27 – 2023-03-30 (×3): qty 60, 30d supply, fill #0

## 2023-03-28 ENCOUNTER — Ambulatory Visit (INDEPENDENT_AMBULATORY_CARE_PROVIDER_SITE_OTHER): Payer: 59 | Admitting: Nurse Practitioner

## 2023-03-28 ENCOUNTER — Encounter: Payer: Self-pay | Admitting: Nurse Practitioner

## 2023-03-28 ENCOUNTER — Other Ambulatory Visit (HOSPITAL_BASED_OUTPATIENT_CLINIC_OR_DEPARTMENT_OTHER): Payer: Self-pay

## 2023-03-28 VITALS — BP 110/80 | HR 94 | Temp 98.0°F | Ht 66.0 in | Wt 206.4 lb

## 2023-03-28 DIAGNOSIS — J019 Acute sinusitis, unspecified: Secondary | ICD-10-CM | POA: Insufficient documentation

## 2023-03-28 LAB — POC COVID19 BINAXNOW: SARS Coronavirus 2 Ag: NEGATIVE

## 2023-03-28 LAB — POCT INFLUENZA A/B
Influenza A, POC: NEGATIVE
Influenza B, POC: NEGATIVE

## 2023-03-28 LAB — POCT RAPID STREP A (OFFICE): Rapid Strep A Screen: NEGATIVE

## 2023-03-28 LAB — POCT RESPIRATORY SYNCYTIAL VIRUS: RSV Rapid Ag: NEGATIVE

## 2023-03-28 MED ORDER — AZITHROMYCIN 250 MG PO TABS
ORAL_TABLET | ORAL | 0 refills | Status: AC
Start: 2023-03-28 — End: 2023-04-03
  Filled 2023-03-28: qty 6, 5d supply, fill #0

## 2023-03-28 NOTE — Progress Notes (Signed)
 Established Patient Office Visit  Subjective   Patient ID: Jade Mathis, female    DOB: 1975-10-11  Age: 48 y.o. MRN: [id number]  Chief Complaint  Patient presents with   Epistaxis    Nose bleed and nasal congestion, been going for a week. Headache and neck pain, pt stated she been taking topamax  and ibuprofen  and muscle relaxer.    Patient arrives today for acute visit for the above.  She reports that her symptoms started 5 to 7 days ago.  She is experiencing nasal congestion, headache, nosebleeds especially when blowing her nose.  She also reports productive cough shortness of breath.  She has muscle aches and pains.  She been treating at home with ibuprofen , Mucinex, inhaler, muscle relaxer, and Topamax .  She reports that she has a history of frequent sinus infections and often requires treatment Z-Pak.  She reports last prescription that she can remember was a couple of years ago.  Per chart review last EKG showed QTc interval within normal limits (430) from 09/2022, she is on sertraline and reports being on that for years.     Review of Systems  Constitutional:  Positive for fever.  HENT:  Positive for congestion, nosebleeds and sinus pain.   Respiratory:  Positive for cough, sputum production, shortness of breath and wheezing.   Cardiovascular:  Negative for chest pain and palpitations.  Musculoskeletal:  Positive for myalgias.  Neurological:  Positive for headaches.      Objective:     BP 110/80   Pulse 94   Temp 98 F (36.7 C) (Temporal)   Ht 5' 6 (1.676 m)   Wt 206 lb 6 oz (93.6 kg)   SpO2 97%   BMI 33.31 kg/m    Physical Exam Vitals reviewed.  Constitutional:      General: She is not in acute distress.    Appearance: Normal appearance.  HENT:     Head: Normocephalic and atraumatic.     Right Ear: Hearing, tympanic membrane and ear canal normal.     Left Ear: Hearing, tympanic membrane and ear canal normal.  Neck:     Vascular: No carotid bruit.   Cardiovascular:     Rate and Rhythm: Normal rate and regular rhythm.     Pulses: Normal pulses.     Heart sounds: Normal heart sounds.  Pulmonary:     Effort: Pulmonary effort is normal.     Breath sounds: Normal breath sounds.  Lymphadenopathy:     Cervical: No cervical adenopathy.  Skin:    General: Skin is warm and dry.  Neurological:     General: No focal deficit present.     Mental Status: She is alert and oriented to person, place, and time.  Psychiatric:        Mood and Affect: Mood normal.        Behavior: Behavior normal.        Judgment: Judgment normal.      Results for orders placed or performed in visit on 03/28/23  POC COVID-19 BinaxNow  Result Value Ref Range   SARS Coronavirus 2 Ag Negative Negative  POCT rapid strep A  Result Value Ref Range   Rapid Strep A Screen Negative Negative  POCT Influenza A/B  Result Value Ref Range   Influenza A, POC Negative Negative   Influenza B, POC Negative Negative  POCT respiratory syncytial virus  Result Value Ref Range   RSV Rapid Ag negative       The 10-year  ASCVD risk score (Arnett DK, et al., 2019) is: 5.6%    Assessment & Plan:   Problem List Items Addressed This Visit       Respiratory   Acute non-recurrent sinusitis - Primary   Acute POC Covid, flu, rsv, strep: Negative Concern for potential secondary bacterial infection. Treat with course of azithromycin . Work note provided per patient request. Patient warned of potential QT prolongation, she reports having taken azithromycin  while being on seroquel  in the past. Told to call office if she experiences cardiac palpitations. Return to clinic if symptoms do not improve or worsen.       Relevant Medications   azithromycin  (ZITHROMAX ) 250 MG tablet   Other Relevant Orders   POC COVID-19 BinaxNow (Completed)   POCT rapid strep A (Completed)   POCT Influenza A/B (Completed)   POCT respiratory syncytial virus (Completed)    Return if symptoms worsen  or fail to improve.    Lauraine FORBES Pereyra, NP

## 2023-03-28 NOTE — Assessment & Plan Note (Addendum)
 Acute POC Covid, flu, rsv, strep: Negative Concern for potential secondary bacterial infection. Treat with course of azithromycin . Work note provided per patient request. Patient warned of potential QT prolongation, she reports having taken azithromycin  while being on seroquel  in the past. Told to call office if she experiences cardiac palpitations. Return to clinic if symptoms do not improve or worsen.

## 2023-03-30 ENCOUNTER — Other Ambulatory Visit (HOSPITAL_BASED_OUTPATIENT_CLINIC_OR_DEPARTMENT_OTHER): Payer: Self-pay

## 2023-03-30 ENCOUNTER — Other Ambulatory Visit: Payer: Self-pay

## 2023-04-02 ENCOUNTER — Other Ambulatory Visit (HOSPITAL_BASED_OUTPATIENT_CLINIC_OR_DEPARTMENT_OTHER): Payer: Self-pay

## 2023-04-02 MED ORDER — AMPHETAMINE-DEXTROAMPHETAMINE 30 MG PO TABS
30.0000 mg | ORAL_TABLET | Freq: Two times a day (BID) | ORAL | 0 refills | Status: DC
  Filled 2023-04-02 – 2023-04-04 (×2): qty 60, 30d supply, fill #0

## 2023-04-03 DIAGNOSIS — I10 Essential (primary) hypertension: Secondary | ICD-10-CM | POA: Diagnosis not present

## 2023-04-03 DIAGNOSIS — E119 Type 2 diabetes mellitus without complications: Secondary | ICD-10-CM | POA: Diagnosis not present

## 2023-04-03 DIAGNOSIS — L304 Erythema intertrigo: Secondary | ICD-10-CM | POA: Diagnosis not present

## 2023-04-03 DIAGNOSIS — Z7952 Long term (current) use of systemic steroids: Secondary | ICD-10-CM | POA: Diagnosis not present

## 2023-04-03 DIAGNOSIS — N62 Hypertrophy of breast: Secondary | ICD-10-CM | POA: Diagnosis not present

## 2023-04-03 DIAGNOSIS — G894 Chronic pain syndrome: Secondary | ICD-10-CM | POA: Diagnosis not present

## 2023-04-03 DIAGNOSIS — M549 Dorsalgia, unspecified: Secondary | ICD-10-CM | POA: Diagnosis not present

## 2023-04-03 DIAGNOSIS — M542 Cervicalgia: Secondary | ICD-10-CM | POA: Diagnosis not present

## 2023-04-03 DIAGNOSIS — G7000 Myasthenia gravis without (acute) exacerbation: Secondary | ICD-10-CM | POA: Diagnosis not present

## 2023-04-04 ENCOUNTER — Other Ambulatory Visit (HOSPITAL_BASED_OUTPATIENT_CLINIC_OR_DEPARTMENT_OTHER): Payer: Self-pay

## 2023-04-04 ENCOUNTER — Other Ambulatory Visit: Payer: Self-pay

## 2023-04-04 DIAGNOSIS — M545 Low back pain, unspecified: Secondary | ICD-10-CM | POA: Diagnosis not present

## 2023-04-04 DIAGNOSIS — B0229 Other postherpetic nervous system involvement: Secondary | ICD-10-CM | POA: Diagnosis not present

## 2023-04-04 DIAGNOSIS — G8929 Other chronic pain: Secondary | ICD-10-CM | POA: Diagnosis not present

## 2023-04-04 DIAGNOSIS — M542 Cervicalgia: Secondary | ICD-10-CM | POA: Diagnosis not present

## 2023-04-04 DIAGNOSIS — Z79899 Other long term (current) drug therapy: Secondary | ICD-10-CM | POA: Diagnosis not present

## 2023-04-04 MED ORDER — OXYCODONE HCL 15 MG PO TABS
15.0000 mg | ORAL_TABLET | Freq: Every day | ORAL | 0 refills | Status: DC | PRN
  Filled 2023-04-04: qty 150, 30d supply, fill #0

## 2023-04-09 ENCOUNTER — Other Ambulatory Visit (HOSPITAL_BASED_OUTPATIENT_CLINIC_OR_DEPARTMENT_OTHER): Payer: Self-pay

## 2023-04-25 DIAGNOSIS — K912 Postsurgical malabsorption, not elsewhere classified: Secondary | ICD-10-CM | POA: Diagnosis not present

## 2023-04-25 DIAGNOSIS — Z903 Acquired absence of stomach [part of]: Secondary | ICD-10-CM | POA: Diagnosis not present

## 2023-05-02 ENCOUNTER — Other Ambulatory Visit (HOSPITAL_BASED_OUTPATIENT_CLINIC_OR_DEPARTMENT_OTHER): Payer: Self-pay

## 2023-05-02 MED ORDER — AMPHETAMINE-DEXTROAMPHETAMINE 30 MG PO TABS
30.0000 mg | ORAL_TABLET | Freq: Two times a day (BID) | ORAL | 0 refills | Status: DC
Start: 2023-05-02 — End: 2023-05-29
  Filled 2023-05-02: qty 60, 30d supply, fill #0

## 2023-05-03 ENCOUNTER — Other Ambulatory Visit (HOSPITAL_BASED_OUTPATIENT_CLINIC_OR_DEPARTMENT_OTHER): Payer: Self-pay

## 2023-05-04 ENCOUNTER — Other Ambulatory Visit (HOSPITAL_BASED_OUTPATIENT_CLINIC_OR_DEPARTMENT_OTHER): Payer: Self-pay

## 2023-05-04 ENCOUNTER — Other Ambulatory Visit (HOSPITAL_COMMUNITY): Payer: Self-pay

## 2023-05-04 DIAGNOSIS — M545 Low back pain, unspecified: Secondary | ICD-10-CM | POA: Diagnosis not present

## 2023-05-04 DIAGNOSIS — G8929 Other chronic pain: Secondary | ICD-10-CM | POA: Diagnosis not present

## 2023-05-04 DIAGNOSIS — B0229 Other postherpetic nervous system involvement: Secondary | ICD-10-CM | POA: Diagnosis not present

## 2023-05-04 DIAGNOSIS — M542 Cervicalgia: Secondary | ICD-10-CM | POA: Diagnosis not present

## 2023-05-04 DIAGNOSIS — Z79899 Other long term (current) drug therapy: Secondary | ICD-10-CM | POA: Diagnosis not present

## 2023-05-04 MED ORDER — OXYCODONE HCL 15 MG PO TABS
15.0000 mg | ORAL_TABLET | Freq: Every day | ORAL | 0 refills | Status: DC | PRN
Start: 2023-05-04 — End: 2023-05-26
  Filled 2023-05-04 (×2): qty 150, 30d supply, fill #0

## 2023-05-05 DIAGNOSIS — K589 Irritable bowel syndrome without diarrhea: Secondary | ICD-10-CM | POA: Diagnosis not present

## 2023-05-05 DIAGNOSIS — K219 Gastro-esophageal reflux disease without esophagitis: Secondary | ICD-10-CM | POA: Diagnosis not present

## 2023-05-05 DIAGNOSIS — E119 Type 2 diabetes mellitus without complications: Secondary | ICD-10-CM | POA: Diagnosis not present

## 2023-05-07 ENCOUNTER — Other Ambulatory Visit: Payer: Self-pay

## 2023-05-07 ENCOUNTER — Other Ambulatory Visit (HOSPITAL_BASED_OUTPATIENT_CLINIC_OR_DEPARTMENT_OTHER): Payer: Self-pay

## 2023-05-10 DIAGNOSIS — Z79899 Other long term (current) drug therapy: Secondary | ICD-10-CM | POA: Diagnosis not present

## 2023-05-18 ENCOUNTER — Other Ambulatory Visit (HOSPITAL_BASED_OUTPATIENT_CLINIC_OR_DEPARTMENT_OTHER): Payer: Self-pay

## 2023-05-18 MED ORDER — TOPIRAMATE 50 MG PO TABS
50.0000 mg | ORAL_TABLET | Freq: Two times a day (BID) | ORAL | 1 refills | Status: DC
  Filled 2023-05-18: qty 60, 30d supply, fill #0

## 2023-05-21 DIAGNOSIS — K219 Gastro-esophageal reflux disease without esophagitis: Secondary | ICD-10-CM | POA: Diagnosis not present

## 2023-05-21 DIAGNOSIS — E119 Type 2 diabetes mellitus without complications: Secondary | ICD-10-CM | POA: Diagnosis not present

## 2023-05-21 DIAGNOSIS — K589 Irritable bowel syndrome without diarrhea: Secondary | ICD-10-CM | POA: Diagnosis not present

## 2023-05-26 ENCOUNTER — Other Ambulatory Visit (HOSPITAL_BASED_OUTPATIENT_CLINIC_OR_DEPARTMENT_OTHER): Payer: Self-pay

## 2023-05-26 DIAGNOSIS — Z79899 Other long term (current) drug therapy: Secondary | ICD-10-CM | POA: Diagnosis not present

## 2023-05-26 DIAGNOSIS — G8929 Other chronic pain: Secondary | ICD-10-CM | POA: Diagnosis not present

## 2023-05-26 DIAGNOSIS — M542 Cervicalgia: Secondary | ICD-10-CM | POA: Diagnosis not present

## 2023-05-26 DIAGNOSIS — M545 Low back pain, unspecified: Secondary | ICD-10-CM | POA: Diagnosis not present

## 2023-05-26 MED ORDER — OXYCODONE HCL 15 MG PO TABS
15.0000 mg | ORAL_TABLET | Freq: Every day | ORAL | 0 refills | Status: AC | PRN
Start: 2023-05-26 — End: ?
  Filled 2023-05-30 – 2023-06-22 (×5): qty 150, 32d supply, fill #0
  Filled 2023-06-29 (×2): qty 150, 30d supply, fill #0

## 2023-05-28 ENCOUNTER — Other Ambulatory Visit (HOSPITAL_BASED_OUTPATIENT_CLINIC_OR_DEPARTMENT_OTHER): Payer: Self-pay

## 2023-05-29 ENCOUNTER — Other Ambulatory Visit (HOSPITAL_BASED_OUTPATIENT_CLINIC_OR_DEPARTMENT_OTHER): Payer: Self-pay

## 2023-05-29 MED ORDER — AMPHETAMINE-DEXTROAMPHETAMINE 30 MG PO TABS
30.0000 mg | ORAL_TABLET | Freq: Two times a day (BID) | ORAL | 0 refills | Status: DC
  Filled 2023-05-29 – 2023-05-30 (×2): qty 60, 30d supply, fill #0

## 2023-05-30 ENCOUNTER — Other Ambulatory Visit (HOSPITAL_BASED_OUTPATIENT_CLINIC_OR_DEPARTMENT_OTHER): Payer: Self-pay

## 2023-05-31 ENCOUNTER — Other Ambulatory Visit (HOSPITAL_BASED_OUTPATIENT_CLINIC_OR_DEPARTMENT_OTHER): Payer: Self-pay

## 2023-05-31 DIAGNOSIS — Z79899 Other long term (current) drug therapy: Secondary | ICD-10-CM | POA: Diagnosis not present

## 2023-06-06 ENCOUNTER — Other Ambulatory Visit (HOSPITAL_BASED_OUTPATIENT_CLINIC_OR_DEPARTMENT_OTHER): Payer: Self-pay

## 2023-06-06 DIAGNOSIS — Z903 Acquired absence of stomach [part of]: Secondary | ICD-10-CM | POA: Diagnosis not present

## 2023-06-06 DIAGNOSIS — E559 Vitamin D deficiency, unspecified: Secondary | ICD-10-CM | POA: Diagnosis not present

## 2023-06-06 MED ORDER — ALPRAZOLAM 1 MG PO TABS
1.0000 mg | ORAL_TABLET | Freq: Four times a day (QID) | ORAL | 0 refills | Status: DC
Start: 2023-06-06 — End: 2023-06-21
  Filled 2023-06-06: qty 120, 30d supply, fill #0

## 2023-06-06 MED ORDER — TOPIRAMATE 50 MG PO TABS
50.0000 mg | ORAL_TABLET | Freq: Two times a day (BID) | ORAL | 1 refills | Status: AC
  Filled 2023-06-06: qty 60, 30d supply, fill #0
  Filled 2023-07-12: qty 60, 30d supply, fill #1

## 2023-06-06 MED ORDER — VITAMIN D (ERGOCALCIFEROL) 1.25 MG (50000 UNIT) PO CAPS
50000.0000 [IU] | ORAL_CAPSULE | ORAL | 11 refills | Status: AC
  Filled 2023-06-06: qty 4, 28d supply, fill #0
  Filled 2023-07-12: qty 4, 28d supply, fill #1
  Filled 2023-09-14: qty 4, 28d supply, fill #2
  Filled 2023-10-15: qty 4, 28d supply, fill #3
  Filled 2023-11-13: qty 4, 28d supply, fill #4
  Filled 2023-12-07: qty 4, 28d supply, fill #5
  Filled 2024-01-15: qty 4, 28d supply, fill #6
  Filled 2024-01-25 – 2024-02-07 (×2): qty 4, 28d supply, fill #7
  Filled 2024-03-06: qty 4, 28d supply, fill #8
  Filled 2024-04-07: qty 4, 28d supply, fill #9

## 2023-06-06 MED ORDER — PHENTERMINE HCL 37.5 MG PO TABS
37.5000 mg | ORAL_TABLET | Freq: Every day | ORAL | 1 refills | Status: DC
  Filled 2023-06-06: qty 30, 30d supply, fill #0
  Filled 2023-07-07: qty 30, 30d supply, fill #1
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
## 2023-06-08 ENCOUNTER — Encounter: Payer: Self-pay | Admitting: Neurology

## 2023-06-08 ENCOUNTER — Telehealth (INDEPENDENT_AMBULATORY_CARE_PROVIDER_SITE_OTHER): Admitting: Neurology

## 2023-06-08 VITALS — Ht 66.0 in | Wt 199.0 lb

## 2023-06-08 DIAGNOSIS — Z87820 Personal history of traumatic brain injury: Secondary | ICD-10-CM

## 2023-06-08 DIAGNOSIS — R1310 Dysphagia, unspecified: Secondary | ICD-10-CM

## 2023-06-08 DIAGNOSIS — Z5181 Encounter for therapeutic drug level monitoring: Secondary | ICD-10-CM | POA: Diagnosis not present

## 2023-06-08 DIAGNOSIS — E039 Hypothyroidism, unspecified: Secondary | ICD-10-CM | POA: Diagnosis not present

## 2023-06-08 DIAGNOSIS — S060X9S Concussion with loss of consciousness of unspecified duration, sequela: Secondary | ICD-10-CM

## 2023-06-08 DIAGNOSIS — H02403 Unspecified ptosis of bilateral eyelids: Secondary | ICD-10-CM

## 2023-06-08 DIAGNOSIS — Z8679 Personal history of other diseases of the circulatory system: Secondary | ICD-10-CM

## 2023-06-08 DIAGNOSIS — R531 Weakness: Secondary | ICD-10-CM | POA: Diagnosis not present

## 2023-06-08 DIAGNOSIS — G7000 Myasthenia gravis without (acute) exacerbation: Secondary | ICD-10-CM

## 2023-06-08 DIAGNOSIS — R471 Dysarthria and anarthria: Secondary | ICD-10-CM

## 2023-06-08 NOTE — Addendum Note (Signed)
 Addended by: Lenise Herald on: 06/08/2023 09:55 AM   Modules accepted: Orders

## 2023-06-10 ENCOUNTER — Other Ambulatory Visit: Payer: Self-pay | Admitting: Internal Medicine

## 2023-06-15 ENCOUNTER — Ambulatory Visit: Payer: 59 | Admitting: Neurology

## 2023-06-15 NOTE — Progress Notes (Deleted)
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 hemorrhage or infarct. No abnormal mass effect or midline shift. No abnormal intra or extra-axial mass lesion. Ventricular size is normal. Cerebellum is unremarkable.   Vascular: No hyperdense vessel or unexpected calcification.   Skull: No acute fracture.   Sinuses/Orbits: See above. Paranasal sinuses are otherwise clear. Orbits are unremarkable   Other: Mastoid air cells and middle ear cavities are clear.   CT CERVICAL SPINE FINDINGS   Alignment: Normal.   Skull base and vertebrae: No acute fracture. No primary bone lesion or focal pathologic process.   Soft tissues and spinal canal: No prevertebral fluid or swelling. No visible canal hematoma.   Disc levels: Intervertebral disc heights are preserved. Prevertebral soft tissues are not thickened on sagittal reformats. Spinal canal is widely patent. No significant neuroforaminal narrowing.   Upper chest: Negative.   Other: None   IMPRESSION: 1. No acute intracranial abnormality. No calvarial fracture. 2. Stable right  anterior cranial fossa meningocele. 3. No acute fracture or listhesis of the cervical spine.   MRI brain w/wo contrast (09/06/21): FINDINGS: Motion artifact is present.   Brain: There is no acute infarction or intracranial hemorrhage. There is no intracranial mass, mass effect, or edema. There is no hydrocephalus or extra-axial fluid collection. Ventricles and sulci are within normal limits in size and configuration. As before, there is abnormal appearance of the anterior right frontal lobe with a prominent sulcus extending inferiorly toward the frontal sinus where there is a osseous defect on CT imaging. No abnormal enhancement.   Vascular: Major vessel flow voids at the skull base are preserved.   Skull and upper cervical spine: Normal marrow signal is preserved.   Sinuses/Orbits: Patchy mucosal thickening. Right frontoethmoid fibroosseous lesion. Orbits are unremarkable.   Other: Sella is "empty."  Mastoid air cells are clear.   IMPRESSION: No acute abnormality or significant change since 2019.   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  MRI cervical spine (01/03/23): IMPRESSION: 1. No significant disc protrusion, foraminal stenosis, or canal stenosis. 2. Partially empty and expanded sella, which is often a normal anatomic variant  but can be associated with idiopathic intracranial hypertension.   EMG of RUE (01/24/23 by Dr Alvester Morin): EMG & NCV Findings: Evaluation of the right median (across palm) sensory nerve showed no response (Palm).  All remaining nerves (as indicated in the following tables) were within normal limits.     All examined muscles (as indicated in the following table) showed no evidence of electrical instability.     Impression: Essentially NORMAL electrodiagnostic study of the right upper limb.  There is no significant  electrodiagnostic evidence of nerve entrapment, brachial plexopathy or cervical radiculopathy.    ASSESSMENT: This is Jolynne Spurgin, a 48 y.o. female with:  ***Does she have positive AChR ab to make her eligible for other treatments?  Plan: ***CMP, CBC w/ diff, MG panel as already ordered  Return to clinic in ***  Total time spent reviewing records, interview, history/exam, documentation, and coordination of care on day of encounter:  *** min  Jacquelyne Balint, MD

## 2023-06-19 ENCOUNTER — Ambulatory Visit (INDEPENDENT_AMBULATORY_CARE_PROVIDER_SITE_OTHER): Admitting: Internal Medicine

## 2023-06-19 ENCOUNTER — Encounter: Payer: Self-pay | Admitting: Internal Medicine

## 2023-06-19 VITALS — BP 122/80 | HR 97 | Temp 98.6°F | Ht 66.0 in | Wt 203.0 lb

## 2023-06-19 DIAGNOSIS — E118 Type 2 diabetes mellitus with unspecified complications: Secondary | ICD-10-CM | POA: Diagnosis not present

## 2023-06-19 DIAGNOSIS — Z7984 Long term (current) use of oral hypoglycemic drugs: Secondary | ICD-10-CM | POA: Diagnosis not present

## 2023-06-19 DIAGNOSIS — F11288 Opioid dependence with other opioid-induced disorder: Secondary | ICD-10-CM | POA: Diagnosis not present

## 2023-06-19 DIAGNOSIS — E1169 Type 2 diabetes mellitus with other specified complication: Secondary | ICD-10-CM

## 2023-06-19 DIAGNOSIS — Z0000 Encounter for general adult medical examination without abnormal findings: Secondary | ICD-10-CM

## 2023-06-19 DIAGNOSIS — F331 Major depressive disorder, recurrent, moderate: Secondary | ICD-10-CM

## 2023-06-19 DIAGNOSIS — E785 Hyperlipidemia, unspecified: Secondary | ICD-10-CM

## 2023-06-19 DIAGNOSIS — N3941 Urge incontinence: Secondary | ICD-10-CM

## 2023-06-19 DIAGNOSIS — K219 Gastro-esophageal reflux disease without esophagitis: Secondary | ICD-10-CM | POA: Diagnosis not present

## 2023-06-19 DIAGNOSIS — F319 Bipolar disorder, unspecified: Secondary | ICD-10-CM

## 2023-06-19 DIAGNOSIS — Z23 Encounter for immunization: Secondary | ICD-10-CM | POA: Diagnosis not present

## 2023-06-19 DIAGNOSIS — E119 Type 2 diabetes mellitus without complications: Secondary | ICD-10-CM | POA: Diagnosis not present

## 2023-06-19 DIAGNOSIS — J4530 Mild persistent asthma, uncomplicated: Secondary | ICD-10-CM

## 2023-06-19 DIAGNOSIS — K589 Irritable bowel syndrome without diarrhea: Secondary | ICD-10-CM | POA: Diagnosis not present

## 2023-06-19 MED ORDER — FREESTYLE LIBRE 3 READER DEVI: 0 refills | Status: AC

## 2023-06-19 MED ORDER — FREESTYLE LIBRE 3 SENSOR MISC: 11 refills | Status: AC

## 2023-06-19 NOTE — Patient Instructions (Signed)
 We have sent in the sugar monitor to watch all the time. This will give you diabetes doctor the evidence to help make changes when you see them. I suspect you will need to go back on insulin at least daily.

## 2023-06-19 NOTE — Assessment & Plan Note (Addendum)
 No flare today she is having stable SOB taking advair and albuterol prn.This is mild persistent and uncomplicated today.

## 2023-06-19 NOTE — Assessment & Plan Note (Signed)
 Flu shot next season due. Pneumonia given 20 today. Tetanus up to date. Colonoscopy due later this year. Mammogram due this year, pap smear up to date with gyn. Counseled about sun safety and mole surveillance. Counseled about the dangers of distracted driving. Given 10 year screening recommendations.

## 2023-06-19 NOTE — Assessment & Plan Note (Signed)
 Lipid panel due this summer and she did not want to resume statin.

## 2023-06-19 NOTE — Assessment & Plan Note (Signed)
 She is still seeing pain management and is high risk given multiple controlled substances. She is newly on phentermine since our last visit. She is taking oxycodone 5 times a day, alprazolam 4 times a day, adderall BID, phentermine. I have asked her to not take phentermine as this is not safe given her health conditions.

## 2023-06-19 NOTE — Assessment & Plan Note (Addendum)
 Most recent Hga1C severely uncontrolled with HGA1c 11.1%. She is not seeing endo until end of month. I have advised she will likely need to start back on insulin. Rx freestyle libre to help with monitoring sugars. She has been eating poorly recently. She had severe N/V with glp-1. She is currently on metformin and jardiance. I recommended lantus back but she wants to wait for endo. She is on ACE-I/ARB but not statin and does not want to resume currently. She is limited due myasthenia gravis and multiple medications.

## 2023-06-19 NOTE — Assessment & Plan Note (Signed)
 Seeing mental health and not well controlled lately. This is causing compensation with food and HgA1c significantly elevated.

## 2023-06-19 NOTE — Assessment & Plan Note (Signed)
 She is not well controlled and having some insomnia lately. She is already taking alprazolam, seroquel, cymbalta, trazodone and I am uncomfortable adjusting regimen given her significant polypharmacy and she never brings her medication list so I am not sure her current regimen and dosing.

## 2023-06-19 NOTE — Assessment & Plan Note (Signed)
 Overall stable and continues to need supplies. Given her other medications a medication for this is not recommended.

## 2023-06-19 NOTE — Progress Notes (Signed)
   Subjective:   Patient ID: Jade Mathis, female    DOB: Dec 22, 1975, 48 y.o.   MRN: [id number]  HPI The patient is here for physical.  PMH, [facility], social history reviewed and updated  Review of Systems  Constitutional:  Positive for activity change, appetite change and fatigue.  HENT: Negative.    Eyes: Negative.   Respiratory:  Positive for shortness of breath. Negative for cough and chest tightness.   Cardiovascular:  Positive for chest pain. Negative for palpitations and leg swelling.  Gastrointestinal:  Positive for abdominal pain and nausea. Negative for abdominal distention, constipation, diarrhea and vomiting.  Musculoskeletal:  Positive for arthralgias, back pain and myalgias.  Skin: Negative.   Neurological:  Positive for weakness and numbness.  Psychiatric/Behavioral:  Positive for decreased concentration, dysphoric mood and sleep disturbance. The patient is nervous/anxious.     Objective:  Physical Exam Constitutional:      Appearance: She is well-developed.     Comments: Chronically ill appearing  HENT:     Head: Normocephalic and atraumatic.  Cardiovascular:     Rate and Rhythm: Normal rate and regular rhythm.  Pulmonary:     Effort: Pulmonary effort is normal. No respiratory distress.     Breath sounds: Normal breath sounds. No wheezing or rales.  Chest:     Chest wall: Tenderness present.  Abdominal:     General: Bowel sounds are normal. There is no distension.     Palpations: Abdomen is soft.     Tenderness: There is abdominal tenderness. There is no rebound.  Musculoskeletal:        General: Tenderness present.     Cervical back: Normal range of motion.  Skin:    General: Skin is warm and dry.  Neurological:     Mental Status: She is alert and oriented to person, place, and time. Mental status is at baseline.     Sensory: Sensory deficit present.     Coordination: Coordination abnormal.     Comments: Less interactive today     Vitals:    06/19/23 0811  BP: 122/80  Pulse: 97  Temp: 98.6 F (37 C)  TempSrc: Oral  SpO2: 98%  Weight: 203 lb (92.1 kg)  Height: 5\' 6"  (1.676 m)    Assessment & Plan:  Prevnar 20 given at visit

## 2023-06-20 ENCOUNTER — Telehealth: Payer: Self-pay | Admitting: Internal Medicine

## 2023-06-20 NOTE — Telephone Encounter (Signed)
 Copied from CRM [id number]. Topic: Referral - Request for Referral >> Jun 19, 2023  4:40 PM Denese Killings wrote: Did the patient discuss referral with their provider in the last year? Yes (If No - schedule appointment) (If Yes - send message)  Appointment offered? No just had appointment today   Type of order/referral and detailed reason for visit: Physical Therapy referral ran out/ need a new one   Preference of office, provider, location: [address] [facility]  If referral order, have you been seen by this specialty before? Yes (If Yes, this issue or another issue? When? Where?  Can we respond through MyChart? Yes

## 2023-06-20 NOTE — Telephone Encounter (Signed)
 Copied from CRM [id number]. Topic: General - Other >> Jun 19, 2023  4:43 PM Denese Killings wrote: Reason for CRM: Patient needs a prescription for medical necessities for a bathtub and must include medical condition (MG) on there. *please send by mail and Mychart

## 2023-06-20 NOTE — Telephone Encounter (Signed)
 Patient is calling in regarding some issue regarding a referral and a tub

## 2023-06-21 ENCOUNTER — Other Ambulatory Visit: Payer: Self-pay

## 2023-06-21 ENCOUNTER — Other Ambulatory Visit (HOSPITAL_BASED_OUTPATIENT_CLINIC_OR_DEPARTMENT_OTHER): Payer: Self-pay

## 2023-06-21 DIAGNOSIS — Z0000 Encounter for general adult medical examination without abnormal findings: Secondary | ICD-10-CM | POA: Diagnosis not present

## 2023-06-21 DIAGNOSIS — Z79899 Other long term (current) drug therapy: Secondary | ICD-10-CM | POA: Diagnosis not present

## 2023-06-21 DIAGNOSIS — I10 Essential (primary) hypertension: Secondary | ICD-10-CM | POA: Diagnosis not present

## 2023-06-21 DIAGNOSIS — E119 Type 2 diabetes mellitus without complications: Secondary | ICD-10-CM | POA: Diagnosis not present

## 2023-06-21 DIAGNOSIS — E7800 Pure hypercholesterolemia, unspecified: Secondary | ICD-10-CM | POA: Diagnosis not present

## 2023-06-21 DIAGNOSIS — E039 Hypothyroidism, unspecified: Secondary | ICD-10-CM | POA: Diagnosis not present

## 2023-06-21 DIAGNOSIS — R0989 Other specified symptoms and signs involving the circulatory and respiratory systems: Secondary | ICD-10-CM | POA: Diagnosis not present

## 2023-06-21 DIAGNOSIS — M79604 Pain in right leg: Secondary | ICD-10-CM | POA: Diagnosis not present

## 2023-06-21 DIAGNOSIS — R0602 Shortness of breath: Secondary | ICD-10-CM | POA: Diagnosis not present

## 2023-06-21 DIAGNOSIS — Z711 Person with feared health complaint in whom no diagnosis is made: Secondary | ICD-10-CM | POA: Diagnosis not present

## 2023-06-21 DIAGNOSIS — E559 Vitamin D deficiency, unspecified: Secondary | ICD-10-CM | POA: Diagnosis not present

## 2023-06-21 MED ORDER — METFORMIN HCL 1000 MG PO TABS
1000.0000 mg | ORAL_TABLET | Freq: Two times a day (BID) | ORAL | 0 refills | Status: DC
  Filled 2023-06-21: qty 180, 90d supply, fill #0

## 2023-06-21 MED ORDER — ALPRAZOLAM 1 MG PO TABS
1.0000 mg | ORAL_TABLET | Freq: Four times a day (QID) | ORAL | 2 refills | Status: DC
  Filled 2023-07-12: qty 120, 30d supply, fill #0
  Filled 2023-08-08: qty 120, 30d supply, fill #1
  Filled 2023-09-14: qty 120, 30d supply, fill #2

## 2023-06-21 MED ORDER — PHENTERMINE HCL 37.5 MG PO TABS
37.5000 mg | ORAL_TABLET | Freq: Every day | ORAL | 0 refills | Status: DC
Start: 2023-06-21 — End: 2024-01-01
  Filled 2023-09-14: qty 30, 30d supply, fill #0

## 2023-06-21 MED ORDER — ERGOCALCIFEROL 1.25 MG (50000 UT) PO CAPS
1.0000 | ORAL_CAPSULE | ORAL | 6 refills | Status: AC
Start: 2023-06-21 — End: ?
  Filled 2023-06-21 – 2024-01-25 (×2): qty 4, 28d supply, fill #0

## 2023-06-21 MED ORDER — FUROSEMIDE 40 MG PO TABS
80.0000 mg | ORAL_TABLET | Freq: Every day | ORAL | 2 refills | Status: AC
Start: 2023-06-21 — End: ?
  Filled 2023-06-21: qty 60, 30d supply, fill #0
  Filled 2023-10-15: qty 60, 30d supply, fill #1

## 2023-06-21 MED ORDER — AMPHETAMINE-DEXTROAMPHETAMINE 30 MG PO TABS
30.0000 mg | ORAL_TABLET | Freq: Two times a day (BID) | ORAL | 0 refills | Status: DC
Start: 2023-06-21 — End: 2023-07-30
  Filled 2023-06-22 – 2023-06-29 (×2): qty 60, 30d supply, fill #0

## 2023-06-21 MED ORDER — TOPIRAMATE 50 MG PO TABS
50.0000 mg | ORAL_TABLET | Freq: Every day | ORAL | 0 refills | Status: AC
  Filled 2023-09-14: qty 30, 30d supply, fill #0

## 2023-06-21 MED ORDER — JARDIANCE 25 MG PO TABS
25.0000 mg | ORAL_TABLET | Freq: Every day | ORAL | 0 refills | Status: DC
  Filled 2023-06-21: qty 90, 90d supply, fill #0

## 2023-06-21 MED ORDER — LISINOPRIL-HYDROCHLOROTHIAZIDE 10-12.5 MG PO TABS
1.0000 | ORAL_TABLET | Freq: Every day | ORAL | 0 refills | Status: AC
  Filled 2023-06-21: qty 90, 90d supply, fill #0

## 2023-06-21 MED ORDER — FUROSEMIDE 40 MG PO TABS
80.0000 mg | ORAL_TABLET | Freq: Every day | ORAL | 0 refills | Status: AC
  Filled 2023-06-21 – 2023-07-14 (×3): qty 180, 90d supply, fill #0

## 2023-06-22 ENCOUNTER — Other Ambulatory Visit (HOSPITAL_BASED_OUTPATIENT_CLINIC_OR_DEPARTMENT_OTHER): Payer: Self-pay

## 2023-06-22 ENCOUNTER — Encounter (HOSPITAL_BASED_OUTPATIENT_CLINIC_OR_DEPARTMENT_OTHER): Payer: Self-pay

## 2023-06-22 ENCOUNTER — Ambulatory Visit

## 2023-06-22 MED ORDER — OZEMPIC (0.25 OR 0.5 MG/DOSE) 2 MG/3ML ~~LOC~~ SOPN
0.2500 mg | PEN_INJECTOR | SUBCUTANEOUS | 0 refills | Status: DC
Start: 2023-06-21 — End: 2024-01-01
  Filled 2023-06-22: qty 3, 28d supply, fill #0

## 2023-06-22 MED ORDER — TRESIBA FLEXTOUCH 100 UNIT/ML ~~LOC~~ SOPN
10.0000 [IU] | PEN_INJECTOR | Freq: Three times a day (TID) | SUBCUTANEOUS | 0 refills | Status: AC
Start: 2023-06-21 — End: ?
  Filled 2023-06-22: qty 15, 50d supply, fill #0

## 2023-06-25 DIAGNOSIS — Z79899 Other long term (current) drug therapy: Secondary | ICD-10-CM | POA: Diagnosis not present

## 2023-06-25 NOTE — Telephone Encounter (Signed)
 I don't see any details about what she is needing PT for?

## 2023-06-26 ENCOUNTER — Ambulatory Visit: Admitting: Neurology

## 2023-06-27 ENCOUNTER — Other Ambulatory Visit (HOSPITAL_COMMUNITY): Payer: Self-pay

## 2023-06-27 ENCOUNTER — Other Ambulatory Visit (HOSPITAL_BASED_OUTPATIENT_CLINIC_OR_DEPARTMENT_OTHER): Payer: Self-pay

## 2023-06-27 DIAGNOSIS — M545 Low back pain, unspecified: Secondary | ICD-10-CM | POA: Diagnosis not present

## 2023-06-27 DIAGNOSIS — G8929 Other chronic pain: Secondary | ICD-10-CM | POA: Diagnosis not present

## 2023-06-27 DIAGNOSIS — Z79899 Other long term (current) drug therapy: Secondary | ICD-10-CM | POA: Diagnosis not present

## 2023-06-27 DIAGNOSIS — M542 Cervicalgia: Secondary | ICD-10-CM | POA: Diagnosis not present

## 2023-06-28 NOTE — Telephone Encounter (Signed)
 Patient would a;so like a prescription for a tub

## 2023-06-28 NOTE — Telephone Encounter (Signed)
 I have sent patient a message in regards to this

## 2023-06-28 NOTE — Telephone Encounter (Signed)
 Her neurologist would be best able to get her PT for her MG, a tub?

## 2023-06-29 ENCOUNTER — Other Ambulatory Visit: Payer: Self-pay

## 2023-06-29 ENCOUNTER — Ambulatory Visit

## 2023-06-29 ENCOUNTER — Other Ambulatory Visit (HOSPITAL_BASED_OUTPATIENT_CLINIC_OR_DEPARTMENT_OTHER): Payer: Self-pay

## 2023-06-29 ENCOUNTER — Other Ambulatory Visit (HOSPITAL_COMMUNITY): Payer: Self-pay

## 2023-06-29 DIAGNOSIS — Z79899 Other long term (current) drug therapy: Secondary | ICD-10-CM | POA: Diagnosis not present

## 2023-06-29 DIAGNOSIS — E118 Type 2 diabetes mellitus with unspecified complications: Secondary | ICD-10-CM | POA: Diagnosis not present

## 2023-06-29 NOTE — Progress Notes (Signed)
 Patient presented in the office today for education on how to apply her [employer] and how to use them. I educated the patient on how to use the monitor and what it was used for (using the information packet that came in the box). I also applied her first sensor for her while talking her through everything I was doing STEP BY STEP. Also showed her the instruction manuel that's in the box. She gave a verbal understanding.

## 2023-07-02 ENCOUNTER — Other Ambulatory Visit: Payer: Self-pay | Admitting: Internal Medicine

## 2023-07-02 DIAGNOSIS — E109 Type 1 diabetes mellitus without complications: Secondary | ICD-10-CM | POA: Diagnosis not present

## 2023-07-02 LAB — HM DIABETES EYE EXAM

## 2023-07-07 ENCOUNTER — Other Ambulatory Visit (HOSPITAL_BASED_OUTPATIENT_CLINIC_OR_DEPARTMENT_OTHER): Payer: Self-pay

## 2023-07-12 ENCOUNTER — Other Ambulatory Visit: Payer: Self-pay

## 2023-07-12 ENCOUNTER — Other Ambulatory Visit (HOSPITAL_BASED_OUTPATIENT_CLINIC_OR_DEPARTMENT_OTHER): Payer: Self-pay

## 2023-07-14 ENCOUNTER — Other Ambulatory Visit (HOSPITAL_BASED_OUTPATIENT_CLINIC_OR_DEPARTMENT_OTHER): Payer: Self-pay

## 2023-07-16 ENCOUNTER — Other Ambulatory Visit (HOSPITAL_BASED_OUTPATIENT_CLINIC_OR_DEPARTMENT_OTHER): Payer: Self-pay

## 2023-07-19 DIAGNOSIS — E1140 Type 2 diabetes mellitus with diabetic neuropathy, unspecified: Secondary | ICD-10-CM | POA: Diagnosis not present

## 2023-07-19 DIAGNOSIS — E039 Hypothyroidism, unspecified: Secondary | ICD-10-CM | POA: Diagnosis not present

## 2023-07-23 DIAGNOSIS — M79605 Pain in left leg: Secondary | ICD-10-CM | POA: Diagnosis not present

## 2023-07-23 DIAGNOSIS — R0989 Other specified symptoms and signs involving the circulatory and respiratory systems: Secondary | ICD-10-CM | POA: Diagnosis not present

## 2023-07-23 DIAGNOSIS — E7800 Pure hypercholesterolemia, unspecified: Secondary | ICD-10-CM | POA: Diagnosis not present

## 2023-07-23 DIAGNOSIS — E559 Vitamin D deficiency, unspecified: Secondary | ICD-10-CM | POA: Diagnosis not present

## 2023-07-23 DIAGNOSIS — Z79899 Other long term (current) drug therapy: Secondary | ICD-10-CM | POA: Diagnosis not present

## 2023-07-23 DIAGNOSIS — E119 Type 2 diabetes mellitus without complications: Secondary | ICD-10-CM | POA: Diagnosis not present

## 2023-07-23 DIAGNOSIS — I10 Essential (primary) hypertension: Secondary | ICD-10-CM | POA: Diagnosis not present

## 2023-07-23 DIAGNOSIS — M79604 Pain in right leg: Secondary | ICD-10-CM | POA: Diagnosis not present

## 2023-07-23 DIAGNOSIS — E039 Hypothyroidism, unspecified: Secondary | ICD-10-CM | POA: Diagnosis not present

## 2023-07-25 DIAGNOSIS — Z79899 Other long term (current) drug therapy: Secondary | ICD-10-CM | POA: Diagnosis not present

## 2023-07-30 ENCOUNTER — Other Ambulatory Visit (HOSPITAL_BASED_OUTPATIENT_CLINIC_OR_DEPARTMENT_OTHER): Payer: Self-pay

## 2023-07-30 MED ORDER — AMPHETAMINE-DEXTROAMPHETAMINE 30 MG PO TABS
30.0000 mg | ORAL_TABLET | Freq: Two times a day (BID) | ORAL | 0 refills | Status: DC
Start: 2023-07-30 — End: 2023-08-29
  Filled 2023-07-30: qty 60, 30d supply, fill #0

## 2023-07-31 ENCOUNTER — Other Ambulatory Visit (HOSPITAL_BASED_OUTPATIENT_CLINIC_OR_DEPARTMENT_OTHER): Payer: Self-pay

## 2023-08-08 ENCOUNTER — Other Ambulatory Visit: Payer: Self-pay

## 2023-08-09 ENCOUNTER — Telehealth: Payer: Self-pay

## 2023-08-09 NOTE — Telephone Encounter (Signed)
 Patient was identified as falling into the [place] Measure - Diabetes.   Patient was: Appointment already scheduled for:  12/19/2023. Per patients PCP note patient is establishing with Endo.

## 2023-08-14 ENCOUNTER — Other Ambulatory Visit (HOSPITAL_BASED_OUTPATIENT_CLINIC_OR_DEPARTMENT_OTHER): Payer: Self-pay

## 2023-08-22 ENCOUNTER — Telehealth: Payer: Self-pay

## 2023-08-22 NOTE — Telephone Encounter (Signed)
 Patient was identified as falling into the [place] Measure - Diabetes.   Patient was: Appointment already scheduled for:  12/19/2023. Is establishing with Endo

## 2023-08-24 ENCOUNTER — Ambulatory Visit: Admitting: Podiatry

## 2023-08-24 ENCOUNTER — Ambulatory Visit: Payer: 59 | Admitting: Neurology

## 2023-08-24 ENCOUNTER — Other Ambulatory Visit

## 2023-08-29 ENCOUNTER — Other Ambulatory Visit (HOSPITAL_BASED_OUTPATIENT_CLINIC_OR_DEPARTMENT_OTHER): Payer: Self-pay

## 2023-08-29 DIAGNOSIS — G8929 Other chronic pain: Secondary | ICD-10-CM | POA: Diagnosis not present

## 2023-08-29 DIAGNOSIS — M545 Low back pain, unspecified: Secondary | ICD-10-CM | POA: Diagnosis not present

## 2023-08-29 DIAGNOSIS — L299 Pruritus, unspecified: Secondary | ICD-10-CM | POA: Diagnosis not present

## 2023-08-29 DIAGNOSIS — M542 Cervicalgia: Secondary | ICD-10-CM | POA: Diagnosis not present

## 2023-08-29 DIAGNOSIS — Z79899 Other long term (current) drug therapy: Secondary | ICD-10-CM | POA: Diagnosis not present

## 2023-08-29 MED ORDER — AMPHETAMINE-DEXTROAMPHETAMINE 30 MG PO TABS
30.0000 mg | ORAL_TABLET | Freq: Two times a day (BID) | ORAL | 0 refills | Status: DC
  Filled 2023-08-29: qty 60, 30d supply, fill #0

## 2023-08-31 ENCOUNTER — Other Ambulatory Visit (HOSPITAL_BASED_OUTPATIENT_CLINIC_OR_DEPARTMENT_OTHER): Payer: Self-pay

## 2023-09-14 ENCOUNTER — Other Ambulatory Visit (HOSPITAL_BASED_OUTPATIENT_CLINIC_OR_DEPARTMENT_OTHER): Payer: Self-pay

## 2023-09-18 ENCOUNTER — Ambulatory Visit: Payer: Self-pay

## 2023-09-18 NOTE — Telephone Encounter (Signed)
 FYI Only or Action Required?: FYI only for provider.  Patient was last seen in primary care on 06/19/2023 by Rollene Almarie LABOR, MD. Called Nurse Triage reporting Anxiety. Symptoms began several weeks ago. Interventions attempted: Nothing. Symptoms are: unchanged.  Triage Disposition: See Physician Within 24 Hours  Patient/caregiver understands and will follow disposition?: YesCopied from CRM [id number]. Topic: Clinical - Red Word Triage >> Sep 18, 2023  3:03 PM Ernestene P wrote: Red Word that prompted transfer to Nurse Triage: weakness , headache , nausea , confusion    ----------------------------------------------------------------------- From previous Reason for Contact - Scheduling: Patient/patient representative is calling to schedule an appointment. Refer to attachments for appointment information. Reason for Disposition  Patient sounds very upset or troubled to the triager  Answer Assessment - Initial Assessment Questions Pt still feels sick, pt reports has a lot of stress from school environment. Nurse talked with pt about her stressors and eval pt, pt states she does not know why she was transferred to a nurse, she needed to make another appmt, too. Nurse explained being her for pt and assessing her situation to provide pt with the best help. Pt states needs to see PCP regarding meds and disability resources she can bring with her to this school that is causing her troubles.   1. CONCERN: Did anything happen that prompted you to call today?      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   2. ANXIETY SYMPTOMS: Can you describe how you (your loved one; patient) have been feeling? (e.g., tense, restless, panicky, anxious, keyed up, overwhelmed, sense of impending doom).      Anxious bc not getting help with troubles at school   3. ONSET: How long have you been feeling this way? (e.g., hours, days, weeks)     For weeks now.   4. SEVERITY: How would you  rate the level of anxiety? (e.g., 0 - 10; or mild, moderate, severe).     moderate 7. RISK OF HARM - SUICIDAL IDEATION: Do you ever have thoughts of hurting or killing yourself? If Yes, ask:  Do you have these feelings now? Do you have a plan on how you would do this?     No, not hurt self or others  11. PATIENT SUPPORT: Who is with you now? Who do you live with? Do you have family or friends who you can talk to?        Yes has husband and kids. She shared this with her husband.   12. OTHER SYMPTOMS: Do you have any other symptoms? (e.g., feeling depressed, trouble concentrating, trouble sleeping, trouble breathing, palpitations or fast heartbeat, chest pain, sweating, nausea, or diarrhea)     Some n/v, dizzy/lighthead; Pt reports Myasthenia Gravis, already affects her vision and cause her to get headaches when stressed - chest discomfort has been present since last week: no radiating. No difficulty breathing.  Pt asking about needing disability options for this problems with the school.  Protocols used: Anxiety and Panic Attack-A-AH

## 2023-09-19 ENCOUNTER — Ambulatory Visit (INDEPENDENT_AMBULATORY_CARE_PROVIDER_SITE_OTHER): Admitting: Emergency Medicine

## 2023-09-19 ENCOUNTER — Ambulatory Visit: Payer: 59

## 2023-09-19 ENCOUNTER — Encounter: Payer: Self-pay | Admitting: Emergency Medicine

## 2023-09-19 VITALS — BP 114/72 | HR 90 | Temp 98.2°F | Ht 66.0 in | Wt 215.0 lb

## 2023-09-19 DIAGNOSIS — F4323 Adjustment disorder with mixed anxiety and depressed mood: Secondary | ICD-10-CM

## 2023-09-19 DIAGNOSIS — F99 Mental disorder, not otherwise specified: Secondary | ICD-10-CM

## 2023-09-19 DIAGNOSIS — F319 Bipolar disorder, unspecified: Secondary | ICD-10-CM

## 2023-09-19 NOTE — Assessment & Plan Note (Signed)
 As per Dr. Rollene last office visit in April 2025: She is not well controlled and having some insomnia lately. She is already taking alprazolam , seroquel , cymbalta , trazodone  and I am uncomfortable adjusting regimen given her significant polypharmacy and she never brings her medication list so I am not sure her current regimen and dosing.

## 2023-09-19 NOTE — Patient Instructions (Signed)

## 2023-09-19 NOTE — Assessment & Plan Note (Signed)
 Having troubles with people at school. Creating anxiety and depression Has follow-up appointment with psychiatrist this afternoon.

## 2023-09-19 NOTE — Progress Notes (Signed)
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 lower your risk of fractures. Be given hormone replacement therapy (HRT) to treat symptoms of menopause. Follow these instructions at home: Alcohol  use Do not drink alcohol  if: Your health care provider tells you not to drink. You are pregnant, may be pregnant, or are planning to become pregnant. If you drink alcohol : Limit how much you have to: 0-1 drink a day. Know how much alcohol  is in your drink. In the [place] one drink equals one 12 oz bottle of beer (355 mL), one 5 oz glass of wine (148 mL), or one 1 oz glass of hard liquor (44 mL). Lifestyle Do not use any products that contain nicotine or tobacco. These products include cigarettes, chewing tobacco, and vaping devices, such as e-cigarettes. If you need help quitting, ask your health care provider. Do not use street drugs. Do not share needles. Ask your health care provider for help if you need support or information about quitting drugs. General instructions Schedule regular health, dental, and eye exams. Stay current  with your vaccines. Tell your health care provider if: You often feel depressed. You have ever been abused or do not feel safe at home. Summary Adopting a healthy lifestyle and getting preventive care are important in promoting health and wellness. Follow your health care provider's instructions about healthy diet, exercising, and getting tested or screened for diseases. Follow your health care provider's instructions on monitoring your cholesterol and blood pressure. This information is not intended to replace advice given to you by your health care provider. Make sure you discuss any questions you have with your health care provider. Document Revised: 07/26/2020 Document Reviewed: 07/26/2020 Elsevier Patient Education  2024 Elsevier Inc.     Emil Schaumann, MD [facility]

## 2023-09-19 NOTE — Assessment & Plan Note (Signed)
 Chronic and complex mix of conditions.  Acute on chronic. Multiple medications.  Do not feel comfortable making any changes. Needs psychiatric evaluation.  States that she has appointment to see her psychiatrist today in the afternoon.

## 2023-09-20 ENCOUNTER — Other Ambulatory Visit (HOSPITAL_BASED_OUTPATIENT_CLINIC_OR_DEPARTMENT_OTHER): Payer: Self-pay

## 2023-09-24 ENCOUNTER — Other Ambulatory Visit (HOSPITAL_BASED_OUTPATIENT_CLINIC_OR_DEPARTMENT_OTHER): Payer: Self-pay

## 2023-09-24 DIAGNOSIS — E1165 Type 2 diabetes mellitus with hyperglycemia: Secondary | ICD-10-CM | POA: Diagnosis not present

## 2023-09-24 DIAGNOSIS — E039 Hypothyroidism, unspecified: Secondary | ICD-10-CM | POA: Diagnosis not present

## 2023-09-24 DIAGNOSIS — E1140 Type 2 diabetes mellitus with diabetic neuropathy, unspecified: Secondary | ICD-10-CM | POA: Diagnosis not present

## 2023-09-24 MED ORDER — MOUNJARO 15 MG/0.5ML ~~LOC~~ SOAJ
15.0000 mg | SUBCUTANEOUS | 3 refills | Status: AC
  Filled 2023-09-24: qty 2, 28d supply, fill #0
  Filled 2023-10-15: qty 2, 28d supply, fill #1
  Filled 2023-11-14 (×2): qty 2, 28d supply, fill #2
  Filled 2023-12-07: qty 2, 28d supply, fill #3

## 2023-09-25 ENCOUNTER — Other Ambulatory Visit (HOSPITAL_BASED_OUTPATIENT_CLINIC_OR_DEPARTMENT_OTHER): Payer: Self-pay

## 2023-09-25 MED ORDER — AMPHETAMINE-DEXTROAMPHETAMINE 30 MG PO TABS
30.0000 mg | ORAL_TABLET | Freq: Two times a day (BID) | ORAL | 0 refills | Status: DC
  Filled 2023-09-28: qty 60, 30d supply, fill #0

## 2023-09-26 ENCOUNTER — Other Ambulatory Visit (HOSPITAL_BASED_OUTPATIENT_CLINIC_OR_DEPARTMENT_OTHER): Payer: Self-pay

## 2023-09-26 ENCOUNTER — Other Ambulatory Visit: Payer: Self-pay | Admitting: Neurology

## 2023-09-26 DIAGNOSIS — G7000 Myasthenia gravis without (acute) exacerbation: Secondary | ICD-10-CM

## 2023-09-27 ENCOUNTER — Telehealth: Payer: Self-pay | Admitting: Podiatry

## 2023-09-27 ENCOUNTER — Other Ambulatory Visit (HOSPITAL_BASED_OUTPATIENT_CLINIC_OR_DEPARTMENT_OTHER): Payer: Self-pay

## 2023-09-27 NOTE — Telephone Encounter (Signed)
 Patient called in to schedule appointment  for orthotic shoes.Jade Mathis. Could you refer her to Jade Mathis ?

## 2023-09-28 ENCOUNTER — Other Ambulatory Visit (HOSPITAL_BASED_OUTPATIENT_CLINIC_OR_DEPARTMENT_OTHER): Payer: Self-pay

## 2023-09-28 DIAGNOSIS — L299 Pruritus, unspecified: Secondary | ICD-10-CM | POA: Diagnosis not present

## 2023-09-28 DIAGNOSIS — M545 Low back pain, unspecified: Secondary | ICD-10-CM | POA: Diagnosis not present

## 2023-09-28 DIAGNOSIS — M542 Cervicalgia: Secondary | ICD-10-CM | POA: Diagnosis not present

## 2023-09-28 DIAGNOSIS — Z79899 Other long term (current) drug therapy: Secondary | ICD-10-CM | POA: Diagnosis not present

## 2023-10-08 ENCOUNTER — Encounter: Payer: Self-pay | Admitting: Podiatry

## 2023-10-08 ENCOUNTER — Ambulatory Visit (INDEPENDENT_AMBULATORY_CARE_PROVIDER_SITE_OTHER): Admitting: Podiatry

## 2023-10-08 DIAGNOSIS — M2010 Hallux valgus (acquired), unspecified foot: Secondary | ICD-10-CM

## 2023-10-08 DIAGNOSIS — E0842 Diabetes mellitus due to underlying condition with diabetic polyneuropathy: Secondary | ICD-10-CM

## 2023-10-08 NOTE — Progress Notes (Signed)
 This patient presents to the office for diabetic foot exam.  This patient says there is numbness in her feet.  No history of infection or drainage.  This patient presents to the office for foot exam due to having a history of diabetes.  Vascular  Dorsalis pedis and posterior tibial pulses are palpable  right foot.  Dorsalis pedies is palpable left foot with absent posterior tibial pulses left foot.  Capillary return  WNL.  Temperature gradient is  WNL.  Skin turgor  WNL  Sensorium  Senn Weinstein monofilament wire diminished janel  B/L.  Nail Exam  Patient has normal nails with no evidence of bacterial or fungal infection.  Orthopedic  Exam  Muscle tone and muscle strength diminished..  No limitations of motion feet  B/L.  No crepitus or joint effusion noted.  Foot type is unremarkable and digits show no abnormalities. HAV  B/L.  Pes planus.  Skin  No open lesions.  Normal skin texture and turgor.   Diabetes with no neuropathy  Diabetic foot exam was performed.  There is  evidence of vascular or neurologic pathology.  RTC  prn  Patient qualifies for diabetic shoes from [facility] due to DPN, HAV B/L and pes planus.   Cordella Bold DPM

## 2023-10-13 ENCOUNTER — Other Ambulatory Visit: Payer: Self-pay | Admitting: Internal Medicine

## 2023-10-13 ENCOUNTER — Other Ambulatory Visit (HOSPITAL_BASED_OUTPATIENT_CLINIC_OR_DEPARTMENT_OTHER): Payer: Self-pay

## 2023-10-15 ENCOUNTER — Other Ambulatory Visit (HOSPITAL_BASED_OUTPATIENT_CLINIC_OR_DEPARTMENT_OTHER): Payer: Self-pay

## 2023-10-15 ENCOUNTER — Emergency Department (HOSPITAL_BASED_OUTPATIENT_CLINIC_OR_DEPARTMENT_OTHER)

## 2023-10-15 ENCOUNTER — Emergency Department (HOSPITAL_BASED_OUTPATIENT_CLINIC_OR_DEPARTMENT_OTHER)
Admission: EM | Admit: 2023-10-15 | Discharge: 2023-10-16 | Disposition: A | Discharge status: Home / Self Care | Attending: Emergency Medicine | Admitting: Emergency Medicine

## 2023-10-15 DIAGNOSIS — E039 Hypothyroidism, unspecified: Secondary | ICD-10-CM | POA: Diagnosis not present

## 2023-10-15 DIAGNOSIS — M79672 Pain in left foot: Secondary | ICD-10-CM | POA: Diagnosis not present

## 2023-10-15 DIAGNOSIS — Z7984 Long term (current) use of oral hypoglycemic drugs: Secondary | ICD-10-CM | POA: Diagnosis not present

## 2023-10-15 DIAGNOSIS — M7732 Calcaneal spur, left foot: Secondary | ICD-10-CM | POA: Diagnosis not present

## 2023-10-15 DIAGNOSIS — S0990XA Unspecified injury of head, initial encounter: Secondary | ICD-10-CM | POA: Diagnosis not present

## 2023-10-15 DIAGNOSIS — R519 Headache, unspecified: Secondary | ICD-10-CM

## 2023-10-15 DIAGNOSIS — W19XXXA Unspecified fall, initial encounter: Secondary | ICD-10-CM | POA: Insufficient documentation

## 2023-10-15 DIAGNOSIS — R110 Nausea: Secondary | ICD-10-CM | POA: Diagnosis not present

## 2023-10-15 DIAGNOSIS — Z79899 Other long term (current) drug therapy: Secondary | ICD-10-CM | POA: Diagnosis not present

## 2023-10-15 DIAGNOSIS — E109 Type 1 diabetes mellitus without complications: Secondary | ICD-10-CM | POA: Insufficient documentation

## 2023-10-15 DIAGNOSIS — J45909 Unspecified asthma, uncomplicated: Secondary | ICD-10-CM | POA: Diagnosis not present

## 2023-10-15 DIAGNOSIS — M542 Cervicalgia: Secondary | ICD-10-CM | POA: Diagnosis not present

## 2023-10-15 DIAGNOSIS — R531 Weakness: Secondary | ICD-10-CM | POA: Diagnosis not present

## 2023-10-15 DIAGNOSIS — S060X0A Concussion without loss of consciousness, initial encounter: Secondary | ICD-10-CM | POA: Insufficient documentation

## 2023-10-15 DIAGNOSIS — M19072 Primary osteoarthritis, left ankle and foot: Secondary | ICD-10-CM | POA: Diagnosis not present

## 2023-10-15 DIAGNOSIS — M545 Low back pain, unspecified: Secondary | ICD-10-CM | POA: Diagnosis not present

## 2023-10-15 DIAGNOSIS — Z794 Long term (current) use of insulin: Secondary | ICD-10-CM | POA: Diagnosis not present

## 2023-10-15 DIAGNOSIS — S3992XA Unspecified injury of lower back, initial encounter: Secondary | ICD-10-CM | POA: Diagnosis not present

## 2023-10-15 DIAGNOSIS — R079 Chest pain, unspecified: Secondary | ICD-10-CM | POA: Diagnosis not present

## 2023-10-15 LAB — CBC
HCT: 38.9 % (ref 36.0–46.0)
Hemoglobin: 11.8 g/dL — ABNORMAL LOW (ref 12.0–15.0)
MCH: 25.7 pg — ABNORMAL LOW (ref 26.0–34.0)
MCHC: 30.3 g/dL (ref 30.0–36.0)
MCV: 84.6 fL (ref 80.0–100.0)
Platelets: 305 K/uL (ref 150–400)
RBC: 4.6 MIL/uL (ref 3.87–5.11)
RDW: 13.7 % (ref 11.5–15.5)
WBC: 7 K/uL (ref 4.0–10.5)
nRBC: 0 % (ref 0.0–0.2)

## 2023-10-15 LAB — URINE DRUG SCREEN
Amphetamines: NOT DETECTED
Barbiturates: NOT DETECTED
Benzodiazepines: NOT DETECTED
Cocaine: NOT DETECTED
Fentanyl: NOT DETECTED
Methadone Scn, Ur: NOT DETECTED
Opiates: NOT DETECTED
Tetrahydrocannabinol: DETECTED — AB

## 2023-10-15 LAB — URINALYSIS, ROUTINE W REFLEX MICROSCOPIC
Bacteria, UA: NONE SEEN
Bilirubin Urine: NEGATIVE
Glucose, UA: >1000 mg/dL — AB
Hgb urine dipstick: NEGATIVE
Ketones, ur: NEGATIVE mg/dL
Leukocytes,Ua: NEGATIVE
Nitrite: NEGATIVE
Protein, ur: NEGATIVE mg/dL
Specific Gravity, Urine: 1.034 — ABNORMAL HIGH (ref 1.005–1.030)
pH: 7 (ref 5.0–8.0)

## 2023-10-15 LAB — COMPREHENSIVE METABOLIC PANEL WITH GFR
ALT: 12 U/L (ref 0–44)
AST: 17 U/L (ref 15–41)
Albumin: 4.1 g/dL (ref 3.5–5.0)
Alkaline Phosphatase: 115 U/L (ref 38–126)
Anion gap: 11 (ref 5–15)
BUN: 12 mg/dL (ref 6–20)
CO2: 26 mmol/L (ref 22–32)
Calcium: 9.9 mg/dL (ref 8.9–10.3)
Chloride: 101 mmol/L (ref 98–111)
Creatinine, Ser: 0.74 mg/dL (ref 0.44–1.00)
GFR, Estimated: >60 mL/min (ref >60)
Glucose, Bld: 117 mg/dL — ABNORMAL HIGH (ref 70–99)
Potassium: 4.1 mmol/L (ref 3.5–5.1)
Sodium: 139 mmol/L (ref 135–145)
Total Bilirubin: 0.4 mg/dL (ref 0.0–1.2)
Total Protein: 7.8 g/dL (ref 6.5–8.1)

## 2023-10-15 LAB — CBG MONITORING, ED: Glucose-Capillary: 99 mg/dL (ref 70–99)

## 2023-10-15 LAB — SALICYLATE LEVEL: Salicylate Lvl: <7 mg/dL — ABNORMAL LOW (ref 7.0–30.0)

## 2023-10-15 LAB — TSH: TSH: 1.66 u[IU]/mL (ref 0.350–4.500)

## 2023-10-15 LAB — LIPASE, BLOOD: Lipase: 17 U/L (ref 11–51)

## 2023-10-15 LAB — PREGNANCY, URINE: Preg Test, Ur: NEGATIVE

## 2023-10-15 LAB — ACETAMINOPHEN LEVEL: Acetaminophen (Tylenol), Serum: <10 ug/mL — ABNORMAL LOW (ref 10–30)

## 2023-10-15 MED ORDER — ONDANSETRON 4 MG PO TBDP: 4.0000 mg | ORAL_TABLET | Freq: Three times a day (TID) | ORAL | 0 refills | Status: AC | PRN

## 2023-10-15 MED ORDER — MORPHINE SULFATE (PF) 4 MG/ML IV SOLN
4.0000 mg | Freq: Once | INTRAVENOUS | Status: AC
  Administered 2023-10-15: 4 mg via INTRAVENOUS
  Filled 2023-10-15: qty 1

## 2023-10-15 MED ORDER — ONDANSETRON HCL 4 MG/2ML IJ SOLN
4.0000 mg | Freq: Once | INTRAMUSCULAR | Status: AC
  Administered 2023-10-15: 4 mg via INTRAVENOUS
  Filled 2023-10-15: qty 2
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
## 2023-10-15 NOTE — ED Notes (Signed)
 Pt given snacks and drink per EDP approval.
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
## 2023-10-15 NOTE — ED Triage Notes (Addendum)
 Pt tripped and fell 2 weeks ago, hitting head on wall and/or doorway and/or floor. Pt does not quite remember details of event and unable to articulate where she was when she fell or if anyone else was there - poor historian.  Pt reports taking pills like candy for the pain over the past few weeks that she had from a previous Rx and now her stomach is hurting as well.  Pt moaning in triage with eyes closed and states some light-sensitivity as well.  Pt add'lly states she hit her left 4th toe in the fall and it still hurts today also.  Pt denies blood thinners.

## 2023-10-15 NOTE — ED Notes (Signed)
 Patient ambulated with minimal assistance in hallway. Stated she felt a little tired and was ready to go home. RN notified.

## 2023-10-16 ENCOUNTER — Other Ambulatory Visit (HOSPITAL_BASED_OUTPATIENT_CLINIC_OR_DEPARTMENT_OTHER): Payer: Self-pay

## 2023-10-16 LAB — T4, FREE: Free T4: 0.91 ng/dL (ref 0.61–1.12)

## 2023-10-16 MED FILL — Metformin HCl Tab 1000 MG: ORAL | 90 days supply | Qty: 180 | Fill #0 | Status: AC

## 2023-10-17 ENCOUNTER — Other Ambulatory Visit (HOSPITAL_BASED_OUTPATIENT_CLINIC_OR_DEPARTMENT_OTHER): Payer: Self-pay

## 2023-10-17 ENCOUNTER — Other Ambulatory Visit: Payer: Self-pay

## 2023-10-17 MED ORDER — QUETIAPINE FUMARATE 400 MG PO TABS
400.0000 mg | ORAL_TABLET | Freq: Every day | ORAL | 2 refills | Status: AC
  Filled 2023-10-17: qty 30, 30d supply, fill #0

## 2023-10-17 MED ORDER — DULOXETINE HCL 60 MG PO CPEP
60.0000 mg | ORAL_CAPSULE | Freq: Every day | ORAL | 2 refills | Status: AC
  Filled 2023-10-17: qty 30, 30d supply, fill #0

## 2023-10-17 MED ORDER — TRAZODONE HCL 100 MG PO TABS
100.0000 mg | ORAL_TABLET | Freq: Every day | ORAL | 2 refills | Status: AC
  Filled 2023-10-17 – 2023-10-29 (×2): qty 30, 30d supply, fill #0
  Filled 2023-11-23: qty 30, 30d supply, fill #1

## 2023-10-17 MED ORDER — AMPHETAMINE-DEXTROAMPHETAMINE 30 MG PO TABS
30.0000 mg | ORAL_TABLET | Freq: Two times a day (BID) | ORAL | 0 refills | Status: DC
  Filled 2023-10-26: qty 60, 30d supply, fill #0

## 2023-10-17 MED ORDER — PERPHENAZINE 4 MG PO TABS
4.0000 mg | ORAL_TABLET | Freq: Every day | ORAL | 2 refills | Status: AC
  Filled 2023-10-17: qty 30, 30d supply, fill #0

## 2023-10-17 MED ORDER — ALPRAZOLAM 1 MG PO TABS
1.0000 mg | ORAL_TABLET | Freq: Four times a day (QID) | ORAL | 2 refills | Status: AC
  Filled 2023-10-17: qty 120, 30d supply, fill #0
  Filled 2023-11-14 (×2): qty 120, 30d supply, fill #1
  Filled 2023-12-12: qty 120, 30d supply, fill #2

## 2023-10-17 MED ORDER — BUSPIRONE HCL 15 MG PO TABS
15.0000 mg | ORAL_TABLET | Freq: Three times a day (TID) | ORAL | 2 refills | Status: AC
  Filled 2023-10-17: qty 90, 30d supply, fill #0

## 2023-10-17 MED ORDER — TIZANIDINE HCL 4 MG PO TABS
4.0000 mg | ORAL_TABLET | Freq: Two times a day (BID) | ORAL | 2 refills | Status: AC | PRN
  Filled 2023-10-17 (×2): qty 60, 30d supply, fill #0

## 2023-10-18 ENCOUNTER — Other Ambulatory Visit: Payer: Self-pay | Admitting: Neurology

## 2023-10-18 DIAGNOSIS — G7000 Myasthenia gravis without (acute) exacerbation: Secondary | ICD-10-CM

## 2023-10-19 ENCOUNTER — Ambulatory Visit

## 2023-10-19 ENCOUNTER — Telehealth: Payer: Self-pay | Admitting: Internal Medicine

## 2023-10-19 ENCOUNTER — Other Ambulatory Visit (HOSPITAL_BASED_OUTPATIENT_CLINIC_OR_DEPARTMENT_OTHER): Payer: Self-pay

## 2023-10-19 DIAGNOSIS — E119 Type 2 diabetes mellitus without complications: Secondary | ICD-10-CM | POA: Diagnosis not present

## 2023-10-19 MED ORDER — MOUNJARO 15 MG/0.5ML ~~LOC~~ SOAJ
15.0000 mg | SUBCUTANEOUS | 2 refills | Status: AC
  Filled 2023-12-31: qty 2, 28d supply, fill #0
  Filled 2024-03-07 – 2024-03-10 (×2): qty 2, 28d supply, fill #1
  Filled 2024-04-07 – 2024-04-11 (×3): qty 2, 28d supply, fill #2

## 2023-10-19 NOTE — Telephone Encounter (Signed)
 Brendell the health coach had an appt with this pt, called her back--- PT SHOWED UP AND WALKED OUT OF [facility] OFFICE DURING APPT AFTER BEING TOLD WHAT IT WAS FOR AND THAT IT WAS NOT WITH HER DOCTOR.   JUST A FYI

## 2023-10-19 NOTE — Progress Notes (Cosign Needed Addendum)
 Patient walked out during me welcoming her back.     Noted.  Karlynn Noel, MD

## 2023-10-19 NOTE — Patient Instructions (Signed)
 Jade Mathis , Thank you for taking time out of your busy schedule to complete your Annual Wellness Visit with me. I enjoyed our conversation and look forward to speaking with you again next year. I, as well as your care team,  appreciate your ongoing commitment to your health goals. Please review the following plan we discussed and let me know if I can assist you in the future. Your Game plan/ To Do List    Referrals: If you haven't heard from the office you've been referred to, please reach out to them at the phone provided.   Follow up Visits: We will see or speak with you next year for your Next Medicare AWV with our clinical staff Have you seen your provider in the last 6 months (3 months if uncontrolled diabetes)?  Clinician Recommendations:  Aim for 30 minutes of exercise or brisk walking, 6-8 glasses of water, and 5 servings of fruits and vegetables each day.       This is a list of the screenings recommended for you:  Health Maintenance  Topic Date Due   Hepatitis B Vaccine (1 of 3 - 19+ 3-dose series) Never done   Yearly kidney health urinalysis for diabetes  01/20/2014   COVID-19 Vaccine (4 - 2024-25 season) 11/19/2022   Hemoglobin A1C  03/02/2023   Mammogram  06/01/2023   Colon Cancer Screening  08/08/2023   Flu Shot  10/19/2023   Complete foot exam   06/18/2024   Eye exam for diabetics  07/01/2024   Yearly kidney function blood test for diabetes  10/14/2024   Medicare Annual Wellness Visit  10/18/2024   DTaP/Tdap/Td vaccine (3 - Td or Tdap) 07/13/2026   Pap with HPV screening  05/30/2027   Pneumococcal Vaccine for high risk medical condition  Completed   Hepatitis C Screening  Completed   HIV Screening  Completed   HPV Vaccine  Aged Out   Meningitis B Vaccine  Aged Out    Advanced directives:  Advance Care Planning is important because it:  [x]  Makes sure you receive the medical care that is consistent with your values, goals, and preferences  [x]  It provides  guidance to your family and loved ones and reduces their decisional burden about whether or not they are making the right decisions based on your wishes.  Follow the link provided in your after visit summary or read over the paperwork we have mailed to you to help you started getting your Advance Directives in place. If you need assistance in completing these, please reach out to us  so that we can help you!  See attachments for Preventive Care and Fall Prevention Tips.

## 2023-10-23 NOTE — Addendum Note (Signed)
 Addended by: GIRARD NO E on: 10/23/2023 10:34 AM   Modules accepted: Level of Service

## 2023-10-26 ENCOUNTER — Other Ambulatory Visit (HOSPITAL_BASED_OUTPATIENT_CLINIC_OR_DEPARTMENT_OTHER): Payer: Self-pay

## 2023-10-27 ENCOUNTER — Other Ambulatory Visit (HOSPITAL_BASED_OUTPATIENT_CLINIC_OR_DEPARTMENT_OTHER): Payer: Self-pay

## 2023-10-29 ENCOUNTER — Other Ambulatory Visit (HOSPITAL_BASED_OUTPATIENT_CLINIC_OR_DEPARTMENT_OTHER): Payer: Self-pay

## 2023-10-29 DIAGNOSIS — E1165 Type 2 diabetes mellitus with hyperglycemia: Secondary | ICD-10-CM | POA: Diagnosis not present

## 2023-10-29 DIAGNOSIS — E039 Hypothyroidism, unspecified: Secondary | ICD-10-CM | POA: Diagnosis not present

## 2023-10-30 ENCOUNTER — Other Ambulatory Visit (HOSPITAL_BASED_OUTPATIENT_CLINIC_OR_DEPARTMENT_OTHER): Payer: Self-pay

## 2023-10-30 DIAGNOSIS — M545 Low back pain, unspecified: Secondary | ICD-10-CM | POA: Diagnosis not present

## 2023-10-30 DIAGNOSIS — Z79899 Other long term (current) drug therapy: Secondary | ICD-10-CM | POA: Diagnosis not present

## 2023-10-30 DIAGNOSIS — M542 Cervicalgia: Secondary | ICD-10-CM | POA: Diagnosis not present

## 2023-10-30 LAB — COMPREHENSIVE METABOLIC PANEL WITH GFR
Albumin: 3.8 (ref 3.5–5.0)
Calcium: 9.2 (ref 8.7–10.7)
Globulin: 3
eGFR: 87

## 2023-10-30 LAB — BASIC METABOLIC PANEL WITH GFR
BUN: 17 (ref 4–21)
CO2: 24 — AB (ref 13–22)
Chloride: 103 (ref 99–108)
Creatinine: 0.8 (ref 0.5–1.1)
Glucose: 131
Potassium: 3.7 meq/L (ref 3.5–5.1)
Sodium: 141 (ref 137–147)

## 2023-10-30 LAB — TSH: TSH: 0.28 — AB (ref 0.41–5.90)

## 2023-10-30 LAB — HEMOGLOBIN A1C: Hemoglobin A1C: 8

## 2023-11-06 ENCOUNTER — Other Ambulatory Visit (HOSPITAL_BASED_OUTPATIENT_CLINIC_OR_DEPARTMENT_OTHER): Payer: Self-pay

## 2023-11-06 ENCOUNTER — Other Ambulatory Visit: Payer: Self-pay

## 2023-11-06 DIAGNOSIS — E1140 Type 2 diabetes mellitus with diabetic neuropathy, unspecified: Secondary | ICD-10-CM | POA: Diagnosis not present

## 2023-11-06 DIAGNOSIS — E039 Hypothyroidism, unspecified: Secondary | ICD-10-CM | POA: Diagnosis not present

## 2023-11-06 DIAGNOSIS — E1165 Type 2 diabetes mellitus with hyperglycemia: Secondary | ICD-10-CM | POA: Diagnosis not present

## 2023-11-06 DIAGNOSIS — I10 Essential (primary) hypertension: Secondary | ICD-10-CM | POA: Diagnosis not present

## 2023-11-06 MED ORDER — INSULIN PEN NEEDLE 32G X 4 MM MISC
11 refills | Status: AC
  Filled 2023-11-06: qty 100, 33d supply, fill #0

## 2023-11-06 MED ORDER — LISINOPRIL 10 MG PO TABS
10.0000 mg | ORAL_TABLET | Freq: Every day | ORAL | 3 refills | Status: AC
  Filled 2023-11-06: qty 90, 90d supply, fill #0
  Filled 2024-03-07: qty 90, 90d supply, fill #1

## 2023-11-07 ENCOUNTER — Other Ambulatory Visit (HOSPITAL_BASED_OUTPATIENT_CLINIC_OR_DEPARTMENT_OTHER): Payer: Self-pay

## 2023-11-13 ENCOUNTER — Other Ambulatory Visit (HOSPITAL_BASED_OUTPATIENT_CLINIC_OR_DEPARTMENT_OTHER): Payer: Self-pay

## 2023-11-14 ENCOUNTER — Other Ambulatory Visit (HOSPITAL_BASED_OUTPATIENT_CLINIC_OR_DEPARTMENT_OTHER): Payer: Self-pay

## 2023-11-14 ENCOUNTER — Other Ambulatory Visit: Payer: Self-pay

## 2023-11-14 MED ORDER — BUSPIRONE HCL 15 MG PO TABS
15.0000 mg | ORAL_TABLET | Freq: Three times a day (TID) | ORAL | 2 refills | Status: AC
  Filled 2023-11-14: qty 90, 30d supply, fill #0

## 2023-11-14 MED ORDER — TIZANIDINE HCL 4 MG PO TABS
4.0000 mg | ORAL_TABLET | Freq: Two times a day (BID) | ORAL | 2 refills | Status: AC | PRN
  Filled 2023-11-14: qty 60, 30d supply, fill #0
  Filled 2024-03-12: qty 60, 30d supply, fill #1

## 2023-11-14 MED ORDER — AMPHETAMINE-DEXTROAMPHETAMINE 30 MG PO TABS
30.0000 mg | ORAL_TABLET | Freq: Two times a day (BID) | ORAL | 0 refills | Status: DC
  Filled 2023-11-23: qty 60, 30d supply, fill #0

## 2023-11-14 MED ORDER — PERPHENAZINE 4 MG PO TABS
4.0000 mg | ORAL_TABLET | Freq: Every day | ORAL | 2 refills | Status: AC
  Filled 2023-11-14: qty 30, 30d supply, fill #0

## 2023-11-14 MED ORDER — TRAZODONE HCL 100 MG PO TABS: 100.0000 mg | ORAL_TABLET | Freq: Every day | ORAL | 2 refills | Status: AC

## 2023-11-14 MED ORDER — DULOXETINE HCL 60 MG PO CPEP
60.0000 mg | ORAL_CAPSULE | Freq: Every day | ORAL | 2 refills | Status: AC
  Filled 2023-11-14: qty 30, 30d supply, fill #0
  Filled 2023-12-15: qty 30, 30d supply, fill #1

## 2023-11-14 MED ORDER — QUETIAPINE FUMARATE 400 MG PO TABS
400.0000 mg | ORAL_TABLET | Freq: Every day | ORAL | 2 refills | Status: AC
  Filled 2023-11-14: qty 30, 30d supply, fill #0

## 2023-11-14 MED ORDER — ALPRAZOLAM 1 MG PO TABS: 1.0000 mg | ORAL_TABLET | Freq: Four times a day (QID) | ORAL | 2 refills | Status: AC

## 2023-11-14 NOTE — Progress Notes (Deleted)
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 contrast (11/04/21): FINDINGS: CT HEAD FINDINGS   Brain: Small osseous defect again identified within the right anterior cranial fossa with a distorted sulcus in this region and fluid opacification of several anterior ethmoid air cells suggesting a chronic meningocele, better assessed on MRI examination of 09/06/2021. No acute intracranial hemorrhage or infarct. No abnormal mass effect or midline shift. No abnormal intra or extra-axial mass lesion. Ventricular size is normal. Cerebellum is unremarkable.   Vascular: No hyperdense vessel or unexpected calcification.   Skull: No acute fracture.   Sinuses/Orbits: See above. Paranasal sinuses are otherwise clear. Orbits are unremarkable   Other: Mastoid air cells and middle ear cavities are clear.   CT CERVICAL SPINE FINDINGS   Alignment: Normal.   Skull base and vertebrae: No acute fracture. No primary bone lesion or focal pathologic process.   Soft tissues and spinal canal: No prevertebral fluid or swelling. No visible canal hematoma.   Disc levels: Intervertebral disc heights are preserved. Prevertebral soft tissues are not thickened on sagittal reformats. Spinal  canal is widely patent. No significant neuroforaminal narrowing.   Upper chest: Negative.   Other: None   IMPRESSION: 1. No acute intracranial abnormality. No calvarial fracture. 2. Stable right anterior cranial fossa meningocele. 3. No acute fracture or listhesis of the cervical spine.   MRI brain w/wo contrast (09/06/21): FINDINGS: Motion artifact is present.   Brain: There is no acute infarction or intracranial hemorrhage. There is no intracranial mass, mass effect, or edema. There is no hydrocephalus or extra-axial fluid collection. Ventricles and sulci are within normal limits in size and configuration. As before, there is abnormal appearance of the anterior right frontal lobe with a prominent sulcus extending inferiorly toward the frontal sinus where there is a osseous defect on CT imaging. No abnormal enhancement.   Vascular: Major vessel flow voids at the skull base are preserved.   Skull and upper cervical spine: Normal marrow signal is preserved.   Sinuses/Orbits: Patchy mucosal thickening. Right frontoethmoid fibroosseous lesion. Orbits are unremarkable.   Other: Sella is empty.  Mastoid air cells  are clear.   IMPRESSION: No acute abnormality or significant change since 2019.   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   MRI cervical spine (01/03/23): IMPRESSION: 1. No significant disc protrusion, foraminal stenosis, or canal stenosis. 2. Partially empty and expanded sella, which is often a normal anatomic variant but can be associated with idiopathic intracranial hypertension.   EMG of RUE (01/24/23 by Dr Eldonna): EMG & NCV Findings: Evaluation of the right median (across palm) sensory nerve showed no response (Palm).  All remaining nerves (as indicated in the following tables) were within normal limits.     All examined  muscles (as indicated in the following table) showed no evidence of electrical instability.     Impression: Essentially NORMAL electrodiagnostic study of the right upper limb.  There is no significant electrodiagnostic evidence of nerve entrapment, brachial plexopathy or cervical radiculopathy.    ASSESSMENT: This is Jade Mathis, a 48 y.o. female with:  ***Does she have positive AChR ab to make her eligible for other treatments?  Plan: ***MG panel as already ordered  Return to clinic in ***  Total time spent reviewing records, interview, history/exam, documentation, and coordination of care on day of encounter:  *** min  Venetia Potters, MD

## 2023-11-23 ENCOUNTER — Other Ambulatory Visit (HOSPITAL_BASED_OUTPATIENT_CLINIC_OR_DEPARTMENT_OTHER): Payer: Self-pay

## 2023-11-23 ENCOUNTER — Ambulatory Visit: Admitting: Neurology

## 2023-11-24 DIAGNOSIS — M545 Low back pain, unspecified: Secondary | ICD-10-CM | POA: Diagnosis not present

## 2023-11-24 DIAGNOSIS — Z79899 Other long term (current) drug therapy: Secondary | ICD-10-CM | POA: Diagnosis not present

## 2023-11-24 DIAGNOSIS — M542 Cervicalgia: Secondary | ICD-10-CM | POA: Diagnosis not present

## 2023-11-24 DIAGNOSIS — G8929 Other chronic pain: Secondary | ICD-10-CM | POA: Diagnosis not present

## 2023-12-07 ENCOUNTER — Other Ambulatory Visit (HOSPITAL_BASED_OUTPATIENT_CLINIC_OR_DEPARTMENT_OTHER): Payer: Self-pay

## 2023-12-11 ENCOUNTER — Other Ambulatory Visit (HOSPITAL_BASED_OUTPATIENT_CLINIC_OR_DEPARTMENT_OTHER): Payer: Self-pay

## 2023-12-12 ENCOUNTER — Other Ambulatory Visit: Payer: Self-pay

## 2023-12-15 ENCOUNTER — Other Ambulatory Visit (HOSPITAL_BASED_OUTPATIENT_CLINIC_OR_DEPARTMENT_OTHER): Payer: Self-pay

## 2023-12-18 ENCOUNTER — Other Ambulatory Visit (HOSPITAL_BASED_OUTPATIENT_CLINIC_OR_DEPARTMENT_OTHER): Payer: Self-pay

## 2023-12-19 ENCOUNTER — Ambulatory Visit: Admitting: Internal Medicine

## 2023-12-19 ENCOUNTER — Other Ambulatory Visit (HOSPITAL_BASED_OUTPATIENT_CLINIC_OR_DEPARTMENT_OTHER): Payer: Self-pay

## 2023-12-19 MED ORDER — MICROLET LANCETS MISC
11 refills | Status: AC
  Filled 2023-12-19: qty 100, 50d supply, fill #0

## 2023-12-19 MED ORDER — CONTOUR NEXT TEST VI STRP
ORAL_STRIP | 11 refills | Status: AC
  Filled 2023-12-19 (×2): qty 200, 90d supply, fill #0

## 2023-12-20 ENCOUNTER — Encounter: Payer: Self-pay | Admitting: Internal Medicine

## 2023-12-21 ENCOUNTER — Other Ambulatory Visit (HOSPITAL_COMMUNITY): Payer: Self-pay

## 2023-12-21 ENCOUNTER — Other Ambulatory Visit (HOSPITAL_BASED_OUTPATIENT_CLINIC_OR_DEPARTMENT_OTHER): Payer: Self-pay

## 2023-12-21 MED ORDER — AMPHETAMINE-DEXTROAMPHETAMINE 30 MG PO TABS
30.0000 mg | ORAL_TABLET | Freq: Two times a day (BID) | ORAL | 0 refills | Status: DC
  Filled 2023-12-21: qty 60, 30d supply, fill #0

## 2023-12-22 DIAGNOSIS — M542 Cervicalgia: Secondary | ICD-10-CM | POA: Diagnosis not present

## 2023-12-22 DIAGNOSIS — G8929 Other chronic pain: Secondary | ICD-10-CM | POA: Diagnosis not present

## 2023-12-22 DIAGNOSIS — Z79899 Other long term (current) drug therapy: Secondary | ICD-10-CM | POA: Diagnosis not present

## 2023-12-22 DIAGNOSIS — M545 Low back pain, unspecified: Secondary | ICD-10-CM | POA: Diagnosis not present

## 2023-12-22 DIAGNOSIS — B029 Zoster without complications: Secondary | ICD-10-CM | POA: Diagnosis not present

## 2023-12-26 DIAGNOSIS — Z79899 Other long term (current) drug therapy: Secondary | ICD-10-CM | POA: Diagnosis not present

## 2023-12-31 ENCOUNTER — Other Ambulatory Visit (HOSPITAL_BASED_OUTPATIENT_CLINIC_OR_DEPARTMENT_OTHER): Payer: Self-pay

## 2024-01-01 ENCOUNTER — Ambulatory Visit: Admitting: Internal Medicine

## 2024-01-01 ENCOUNTER — Encounter: Payer: Self-pay | Admitting: Internal Medicine

## 2024-01-01 VITALS — BP 130/84 | HR 71 | Temp 98.1°F | Ht 66.0 in | Wt 202.0 lb

## 2024-01-01 DIAGNOSIS — J4530 Mild persistent asthma, uncomplicated: Secondary | ICD-10-CM | POA: Diagnosis not present

## 2024-01-01 DIAGNOSIS — E118 Type 2 diabetes mellitus with unspecified complications: Secondary | ICD-10-CM | POA: Diagnosis not present

## 2024-01-01 DIAGNOSIS — Z23 Encounter for immunization: Secondary | ICD-10-CM

## 2024-01-01 DIAGNOSIS — G43909 Migraine, unspecified, not intractable, without status migrainosus: Secondary | ICD-10-CM | POA: Diagnosis not present

## 2024-01-01 NOTE — Progress Notes (Unsigned)
   Subjective:   Patient ID: Jade Mathis, female    DOB: 1975-08-20, 48 y.o.   MRN: [id number]  Discussed the use of AI scribe software for clinical note transcription with the patient, who gave verbal consent to proceed. History of Present Illness Jade Mathis is a 48 year old female with myasthenia gravis and diabetes who presents for follow-up of her chronic conditions.  She experienced a recent episode related to her myasthenia gravis, resulting in a fall and head injury about one month ago. During her hospitalization, imaging showed no fractures or strokes. She continues to suffer from severe headaches that wake her up, and her current migraine medication is ineffective. She is also taking oxycodone , which causes drowsiness but does not alleviate the headaches.  Her diabetes management includes once-daily insulin  and monitoring her blood sugar levels twice a day. No episodes of hypoglycemia. She is currently taking Mounjaro  at a dose of 15 mg, having previously experienced issues with Ozempic . She is now seeing endocrinology and they are managing her sugars which are improved but still uncontrolled.   She experiences constipation but is not taking any medication for it.  She reports her asthma has been a little worse than usual, and she is using albuterol .  Her current medication regimen includes topiramate , which she takes once or twice a day she is unsure about dosing and both doses are on her list.   Review of Systems  Objective:  Physical Exam  Vitals:   01/01/24 1551  BP: 130/84  Pulse: 71  Temp: 98.1 F (36.7 C)  TempSrc: Oral  SpO2: 99%  Weight: 202 lb (91.6 kg)  Height: 5' 6 (1.676 m)    Assessment and Plan Assessment & Plan Encounter for immunization   She was due for a flu shot and agreed to receive it. Administered flu shot today.  Type 2 diabetes mellitus   She is on once-daily insulin  and monitors blood glucose twice daily with no hypoglycemic  episodes. Recent A1c was checked by her endocrinologist which is uncontrolled but improved from prior. She is on chronic steroids for her MG which limits control.  Migraine   Current medication is ineffective. Oxycodone  provides some relief but causes drowsiness. She is seeing neurology and I would not adjust her regimen today given polypharmacy and she is not sure about meds or doses.   Asthma   Symptoms are slightly worse due to allergies and seasonal changes, leading to increased albuterol  use. No clear infection or flare today will monitor closely.

## 2024-01-01 NOTE — Patient Instructions (Addendum)
 We do not need labs today.

## 2024-01-09 ENCOUNTER — Other Ambulatory Visit (HOSPITAL_COMMUNITY): Payer: Self-pay

## 2024-01-09 MED ORDER — AMPHETAMINE-DEXTROAMPHETAMINE 30 MG PO TABS
30.0000 mg | ORAL_TABLET | Freq: Two times a day (BID) | ORAL | 0 refills | Status: DC
  Filled 2024-01-22: qty 60, 30d supply, fill #0
  Filled ????-??-?? (×2): fill #0

## 2024-01-14 ENCOUNTER — Other Ambulatory Visit (HOSPITAL_BASED_OUTPATIENT_CLINIC_OR_DEPARTMENT_OTHER): Payer: Self-pay

## 2024-01-14 MED ORDER — DULOXETINE HCL 60 MG PO CPEP
60.0000 mg | ORAL_CAPSULE | Freq: Every day | ORAL | 2 refills | Status: AC
  Filled 2024-01-14: qty 30, 30d supply, fill #0

## 2024-01-14 MED ORDER — QUETIAPINE FUMARATE 400 MG PO TABS
400.0000 mg | ORAL_TABLET | Freq: Every day | ORAL | 2 refills | Status: AC
  Filled 2024-01-14: qty 30, 30d supply, fill #0

## 2024-01-14 MED ORDER — PERPHENAZINE 4 MG PO TABS
4.0000 mg | ORAL_TABLET | Freq: Every day | ORAL | 2 refills | Status: AC
  Filled 2024-01-14: qty 30, 30d supply, fill #0

## 2024-01-14 MED ORDER — TRAZODONE HCL 100 MG PO TABS
200.0000 mg | ORAL_TABLET | Freq: Every day | ORAL | 2 refills | Status: AC
  Filled 2024-01-14: qty 60, 30d supply, fill #0
  Filled 2024-03-08: qty 60, 30d supply, fill #1

## 2024-01-14 MED ORDER — BUSPIRONE HCL 15 MG PO TABS
15.0000 mg | ORAL_TABLET | Freq: Three times a day (TID) | ORAL | 2 refills | Status: AC
  Filled 2024-01-14: qty 90, 30d supply, fill #0

## 2024-01-15 ENCOUNTER — Other Ambulatory Visit: Payer: Self-pay

## 2024-01-15 ENCOUNTER — Other Ambulatory Visit (HOSPITAL_BASED_OUTPATIENT_CLINIC_OR_DEPARTMENT_OTHER): Payer: Self-pay

## 2024-01-16 ENCOUNTER — Other Ambulatory Visit (HOSPITAL_BASED_OUTPATIENT_CLINIC_OR_DEPARTMENT_OTHER): Payer: Self-pay

## 2024-01-16 ENCOUNTER — Other Ambulatory Visit: Payer: Self-pay

## 2024-01-16 MED ORDER — ALPRAZOLAM 1 MG PO TABS
1.0000 mg | ORAL_TABLET | Freq: Four times a day (QID) | ORAL | 2 refills | Status: AC
  Filled 2024-01-16: qty 120, 30d supply, fill #0

## 2024-01-17 ENCOUNTER — Other Ambulatory Visit (HOSPITAL_BASED_OUTPATIENT_CLINIC_OR_DEPARTMENT_OTHER): Payer: Self-pay

## 2024-01-17 ENCOUNTER — Ambulatory Visit: Admitting: Neurology

## 2024-01-17 DIAGNOSIS — E039 Hypothyroidism, unspecified: Secondary | ICD-10-CM | POA: Diagnosis not present

## 2024-01-17 DIAGNOSIS — E1165 Type 2 diabetes mellitus with hyperglycemia: Secondary | ICD-10-CM | POA: Diagnosis not present

## 2024-01-18 ENCOUNTER — Other Ambulatory Visit (HOSPITAL_BASED_OUTPATIENT_CLINIC_OR_DEPARTMENT_OTHER): Payer: Self-pay

## 2024-01-21 ENCOUNTER — Encounter: Payer: Self-pay | Admitting: Radiology

## 2024-01-22 ENCOUNTER — Other Ambulatory Visit (HOSPITAL_BASED_OUTPATIENT_CLINIC_OR_DEPARTMENT_OTHER): Payer: Self-pay

## 2024-01-23 ENCOUNTER — Other Ambulatory Visit (HOSPITAL_BASED_OUTPATIENT_CLINIC_OR_DEPARTMENT_OTHER): Payer: Self-pay

## 2024-01-23 ENCOUNTER — Other Ambulatory Visit: Payer: Self-pay

## 2024-01-23 MED ORDER — ROSUVASTATIN CALCIUM 5 MG PO TABS
5.0000 mg | ORAL_TABLET | Freq: Every day | ORAL | 5 refills | Status: AC
  Filled 2024-01-23: qty 30, 30d supply, fill #0
  Filled 2024-02-22: qty 30, 30d supply, fill #1
  Filled 2024-02-22: qty 30, 30d supply, fill #0
  Filled 2024-03-26: qty 30, 30d supply, fill #1

## 2024-01-25 ENCOUNTER — Other Ambulatory Visit (HOSPITAL_BASED_OUTPATIENT_CLINIC_OR_DEPARTMENT_OTHER): Payer: Self-pay

## 2024-01-29 ENCOUNTER — Other Ambulatory Visit (HOSPITAL_BASED_OUTPATIENT_CLINIC_OR_DEPARTMENT_OTHER): Payer: Self-pay

## 2024-01-30 ENCOUNTER — Other Ambulatory Visit: Payer: Self-pay

## 2024-01-30 ENCOUNTER — Other Ambulatory Visit (HOSPITAL_BASED_OUTPATIENT_CLINIC_OR_DEPARTMENT_OTHER): Payer: Self-pay

## 2024-01-30 MED ORDER — LEVOTHYROXINE SODIUM 150 MCG PO TABS
150.0000 ug | ORAL_TABLET | Freq: Every day | ORAL | 1 refills | Status: AC
  Filled 2024-01-30: qty 30, 30d supply, fill #0
  Filled 2024-01-30: qty 86, 100d supply, fill #0
  Filled 2024-03-07: qty 7, 7d supply, fill #1
  Filled 2024-03-08: qty 7, 7d supply, fill #2

## 2024-02-06 ENCOUNTER — Other Ambulatory Visit (HOSPITAL_BASED_OUTPATIENT_CLINIC_OR_DEPARTMENT_OTHER): Payer: Self-pay

## 2024-02-07 ENCOUNTER — Other Ambulatory Visit (HOSPITAL_BASED_OUTPATIENT_CLINIC_OR_DEPARTMENT_OTHER): Payer: Self-pay

## 2024-02-12 ENCOUNTER — Other Ambulatory Visit (HOSPITAL_BASED_OUTPATIENT_CLINIC_OR_DEPARTMENT_OTHER): Payer: Self-pay

## 2024-02-12 MED ORDER — BUSPIRONE HCL 15 MG PO TABS
15.0000 mg | ORAL_TABLET | Freq: Three times a day (TID) | ORAL | 2 refills | Status: AC
  Filled 2024-02-12: qty 90, 30d supply, fill #0
  Filled 2024-03-08: qty 90, 30d supply, fill #1

## 2024-02-12 MED ORDER — DULOXETINE HCL 60 MG PO CPEP
60.0000 mg | ORAL_CAPSULE | Freq: Every day | ORAL | 2 refills | Status: AC
Start: 2024-02-12 — End: ?
  Filled 2024-02-12: qty 30, 30d supply, fill #0

## 2024-02-12 MED ORDER — PERPHENAZINE 4 MG PO TABS
4.0000 mg | ORAL_TABLET | Freq: Every day | ORAL | 2 refills | Status: AC
Start: 2024-02-12 — End: ?
  Filled 2024-02-12: qty 30, 30d supply, fill #0

## 2024-02-12 MED ORDER — TRAZODONE HCL 100 MG PO TABS
200.0000 mg | ORAL_TABLET | Freq: Every day | ORAL | 2 refills | Status: AC
  Filled 2024-02-12: qty 60, 30d supply, fill #0

## 2024-02-12 MED ORDER — QUETIAPINE FUMARATE 400 MG PO TABS
400.0000 mg | ORAL_TABLET | Freq: Every evening | ORAL | 2 refills | Status: AC
  Filled 2024-02-12: qty 30, 30d supply, fill #0
  Filled 2024-03-08: qty 30, 30d supply, fill #1

## 2024-02-13 ENCOUNTER — Other Ambulatory Visit (HOSPITAL_COMMUNITY): Payer: Self-pay

## 2024-02-13 ENCOUNTER — Other Ambulatory Visit (HOSPITAL_BASED_OUTPATIENT_CLINIC_OR_DEPARTMENT_OTHER): Payer: Self-pay

## 2024-02-13 MED ORDER — ALPRAZOLAM 1 MG PO TABS
1.0000 mg | ORAL_TABLET | Freq: Four times a day (QID) | ORAL | 2 refills | Status: AC
  Filled 2024-02-13 – 2024-02-15 (×2): qty 120, 30d supply, fill #0
  Filled 2024-03-17: qty 120, 30d supply, fill #1
  Filled 2024-04-15: qty 120, 30d supply, fill #2

## 2024-02-13 MED ORDER — AMPHETAMINE-DEXTROAMPHETAMINE 30 MG PO TABS
30.0000 mg | ORAL_TABLET | Freq: Two times a day (BID) | ORAL | 0 refills | Status: DC
  Filled 2024-02-13 – 2024-02-22 (×2): qty 60, 30d supply, fill #0

## 2024-02-15 ENCOUNTER — Other Ambulatory Visit (HOSPITAL_BASED_OUTPATIENT_CLINIC_OR_DEPARTMENT_OTHER): Payer: Self-pay

## 2024-02-22 ENCOUNTER — Other Ambulatory Visit (HOSPITAL_COMMUNITY): Payer: Self-pay

## 2024-02-22 ENCOUNTER — Other Ambulatory Visit (HOSPITAL_BASED_OUTPATIENT_CLINIC_OR_DEPARTMENT_OTHER): Payer: Self-pay

## 2024-02-22 ENCOUNTER — Other Ambulatory Visit: Payer: Self-pay | Admitting: Internal Medicine

## 2024-03-03 ENCOUNTER — Other Ambulatory Visit (HOSPITAL_BASED_OUTPATIENT_CLINIC_OR_DEPARTMENT_OTHER): Payer: Self-pay

## 2024-03-03 MED ORDER — JARDIANCE 25 MG PO TABS
ORAL_TABLET | Freq: Every day | ORAL | 0 refills | Status: DC
  Filled 2024-03-03: qty 45, 90d supply, fill #0

## 2024-03-05 ENCOUNTER — Other Ambulatory Visit (HOSPITAL_BASED_OUTPATIENT_CLINIC_OR_DEPARTMENT_OTHER): Payer: Self-pay

## 2024-03-06 ENCOUNTER — Other Ambulatory Visit (HOSPITAL_BASED_OUTPATIENT_CLINIC_OR_DEPARTMENT_OTHER): Payer: Self-pay

## 2024-03-07 ENCOUNTER — Other Ambulatory Visit (HOSPITAL_COMMUNITY): Payer: Self-pay

## 2024-03-07 ENCOUNTER — Other Ambulatory Visit: Payer: Self-pay

## 2024-03-07 ENCOUNTER — Other Ambulatory Visit (HOSPITAL_BASED_OUTPATIENT_CLINIC_OR_DEPARTMENT_OTHER): Payer: Self-pay

## 2024-03-07 ENCOUNTER — Telehealth: Payer: Self-pay | Admitting: Pharmacist

## 2024-03-07 NOTE — Progress Notes (Signed)
 Pharmacy Quality Measure Review  This patient is appearing on a report for being at risk of failing the adherence measure for hypertension (ACEi/ARB) medications this calendar year.   Medication: Lisinopril  10 mg Last fill date: 11/06/23 for 90 day supply  Spoke to patient regarding lisinopril  adherence - she notes she takes it but needs a refill. She also mentions lisinopril /hydrochlorothiazide  which also appears on her medication list. Both lisinopril  and lisinopril /hydrochlorothiazide  are prescribed by different providers. Her medication list has a lot of duplicates and it is unclear which medications she is currently supposed to be taking. Lisinopril  did have refills remaining and appears to be the last ACE on med list from her [facility] appts in November. Contacted pharmacy to facilitate refills.   Pt also requested ibuprofen  400 or 800 mg which she states Dr. Rollene prescribes however it is not currently on her medication list. Can see Dr. Rollene prescribed ibuprofen  400 mg in March 2024, which was then discontinued in April 2025.   Darrelyn Drum, PharmD, BCPS, CPP Clinical Pharmacist Practitioner [facility] [phone]

## 2024-03-08 ENCOUNTER — Other Ambulatory Visit (HOSPITAL_BASED_OUTPATIENT_CLINIC_OR_DEPARTMENT_OTHER): Payer: Self-pay

## 2024-03-10 ENCOUNTER — Other Ambulatory Visit: Payer: Self-pay | Admitting: Family

## 2024-03-10 ENCOUNTER — Other Ambulatory Visit (HOSPITAL_BASED_OUTPATIENT_CLINIC_OR_DEPARTMENT_OTHER): Payer: Self-pay

## 2024-03-10 ENCOUNTER — Other Ambulatory Visit: Payer: Self-pay

## 2024-03-10 MED ORDER — IBUPROFEN 800 MG PO TABS: 800.0000 mg | ORAL_TABLET | Freq: Three times a day (TID) | ORAL | 0 refills | Status: AC | PRN

## 2024-03-11 ENCOUNTER — Other Ambulatory Visit (HOSPITAL_BASED_OUTPATIENT_CLINIC_OR_DEPARTMENT_OTHER): Payer: Self-pay

## 2024-03-11 MED ORDER — TIZANIDINE HCL 6 MG PO CAPS
6.0000 mg | ORAL_CAPSULE | Freq: Two times a day (BID) | ORAL | 0 refills | Status: AC | PRN
  Filled 2024-03-11: qty 60, 30d supply, fill #0

## 2024-03-11 MED ORDER — QUETIAPINE FUMARATE 400 MG PO TABS
400.0000 mg | ORAL_TABLET | Freq: Every day | ORAL | 2 refills | Status: AC
  Filled 2024-03-11 – 2024-04-11 (×2): qty 30, 30d supply, fill #0

## 2024-03-11 MED ORDER — TRAZODONE HCL 100 MG PO TABS
200.0000 mg | ORAL_TABLET | Freq: Every day | ORAL | 2 refills | Status: AC
  Filled 2024-03-11 – 2024-04-11 (×2): qty 60, 30d supply, fill #0

## 2024-03-11 MED ORDER — ALPRAZOLAM 1 MG PO TABS: 1.0000 mg | ORAL_TABLET | Freq: Four times a day (QID) | ORAL | 2 refills | Status: AC

## 2024-03-11 MED ORDER — PERPHENAZINE 4 MG PO TABS
4.0000 mg | ORAL_TABLET | Freq: Every day | ORAL | 2 refills | Status: AC
  Filled 2024-03-11: qty 30, 30d supply, fill #0
  Filled 2024-04-11: qty 30, 30d supply, fill #1

## 2024-03-11 MED ORDER — DULOXETINE HCL 60 MG PO CPEP
60.0000 mg | ORAL_CAPSULE | Freq: Every day | ORAL | 2 refills | Status: AC
  Filled 2024-03-11: qty 30, 30d supply, fill #0
  Filled 2024-04-11: qty 30, 30d supply, fill #1

## 2024-03-11 MED ORDER — AMPHETAMINE-DEXTROAMPHETAMINE 30 MG PO TABS
30.0000 mg | ORAL_TABLET | Freq: Two times a day (BID) | ORAL | 0 refills | Status: AC
  Filled 2024-03-21 – 2024-04-24 (×2): qty 60, 30d supply, fill #0

## 2024-03-11 MED ORDER — BUSPIRONE HCL 15 MG PO TABS
15.0000 mg | ORAL_TABLET | Freq: Three times a day (TID) | ORAL | 2 refills | Status: AC
  Filled 2024-03-11 – 2024-04-11 (×2): qty 90, 30d supply, fill #0

## 2024-03-12 ENCOUNTER — Other Ambulatory Visit (HOSPITAL_BASED_OUTPATIENT_CLINIC_OR_DEPARTMENT_OTHER): Payer: Self-pay

## 2024-03-12 ENCOUNTER — Other Ambulatory Visit: Payer: Self-pay

## 2024-03-14 ENCOUNTER — Other Ambulatory Visit (HOSPITAL_BASED_OUTPATIENT_CLINIC_OR_DEPARTMENT_OTHER): Payer: Self-pay

## 2024-03-15 ENCOUNTER — Other Ambulatory Visit (HOSPITAL_BASED_OUTPATIENT_CLINIC_OR_DEPARTMENT_OTHER): Payer: Self-pay

## 2024-03-17 ENCOUNTER — Other Ambulatory Visit (HOSPITAL_BASED_OUTPATIENT_CLINIC_OR_DEPARTMENT_OTHER): Payer: Self-pay

## 2024-03-18 ENCOUNTER — Other Ambulatory Visit (HOSPITAL_BASED_OUTPATIENT_CLINIC_OR_DEPARTMENT_OTHER): Payer: Self-pay

## 2024-03-18 ENCOUNTER — Other Ambulatory Visit (HOSPITAL_COMMUNITY): Payer: Self-pay

## 2024-03-18 MED ORDER — AMPHETAMINE-DEXTROAMPHETAMINE 30 MG PO TABS
30.0000 mg | ORAL_TABLET | Freq: Two times a day (BID) | ORAL | 0 refills | Status: AC
  Filled 2024-03-21 (×2): qty 180, 90d supply, fill #0

## 2024-03-21 ENCOUNTER — Other Ambulatory Visit (HOSPITAL_BASED_OUTPATIENT_CLINIC_OR_DEPARTMENT_OTHER): Payer: Self-pay

## 2024-03-21 ENCOUNTER — Other Ambulatory Visit (HOSPITAL_COMMUNITY): Payer: Self-pay

## 2024-03-21 ENCOUNTER — Other Ambulatory Visit: Payer: Self-pay

## 2024-03-25 ENCOUNTER — Other Ambulatory Visit (HOSPITAL_BASED_OUTPATIENT_CLINIC_OR_DEPARTMENT_OTHER): Payer: Self-pay

## 2024-03-26 ENCOUNTER — Other Ambulatory Visit (HOSPITAL_BASED_OUTPATIENT_CLINIC_OR_DEPARTMENT_OTHER): Payer: Self-pay

## 2024-04-07 ENCOUNTER — Other Ambulatory Visit: Payer: Self-pay

## 2024-04-07 ENCOUNTER — Other Ambulatory Visit (HOSPITAL_BASED_OUTPATIENT_CLINIC_OR_DEPARTMENT_OTHER): Payer: Self-pay

## 2024-04-09 ENCOUNTER — Encounter: Payer: Self-pay | Admitting: Podiatry

## 2024-04-09 ENCOUNTER — Ambulatory Visit: Admitting: Podiatry

## 2024-04-09 DIAGNOSIS — B351 Tinea unguium: Secondary | ICD-10-CM

## 2024-04-09 DIAGNOSIS — E0842 Diabetes mellitus due to underlying condition with diabetic polyneuropathy: Secondary | ICD-10-CM

## 2024-04-09 DIAGNOSIS — M79674 Pain in right toe(s): Secondary | ICD-10-CM

## 2024-04-09 DIAGNOSIS — M79675 Pain in left toe(s): Secondary | ICD-10-CM

## 2024-04-09 NOTE — Progress Notes (Signed)
 This patient presents to the office for diabetic foot exam.  This patient says there is numbness in her feet.  No history of infection or drainage.  This patient presents to the office for foot exam due to having a history of diabetes.  Vascular  Dorsalis pedis and posterior tibial pulses are palpable  right foot.  Dorsalis pedies is palpable left foot with absent posterior tibial pulses left foot.  Capillary return  WNL.  Temperature gradient is  WNL.  Skin turgor  WNL  Sensorium  Senn Weinstein monofilament wire diminished janel  B/L.  Nail Exam  Patient has normal nails with no evidence of bacterial or fungal infection.  Orthopedic  Exam  Muscle tone and muscle strength diminished..  No limitations of motion feet  B/L.  No crepitus or joint effusion noted.  Foot type is unremarkable and digits show no abnormalities. HAV  B/L.  Pes planus.  Skin  No open lesions.  Normal skin texture and turgor.   Diabetes with no neuropathy  Diabetic foot exam was performed.  There is  evidence of vascular or neurologic pathology.  RTC  prn  Patient qualifies for diabetic shoes from [facility] due to DPN, HAV B/L and pes planus.   Cordella Bold DPM

## 2024-04-11 ENCOUNTER — Other Ambulatory Visit: Payer: Self-pay

## 2024-04-11 ENCOUNTER — Other Ambulatory Visit (HOSPITAL_BASED_OUTPATIENT_CLINIC_OR_DEPARTMENT_OTHER): Payer: Self-pay

## 2024-04-14 ENCOUNTER — Other Ambulatory Visit (HOSPITAL_BASED_OUTPATIENT_CLINIC_OR_DEPARTMENT_OTHER): Payer: Self-pay

## 2024-04-15 ENCOUNTER — Other Ambulatory Visit (HOSPITAL_BASED_OUTPATIENT_CLINIC_OR_DEPARTMENT_OTHER): Payer: Self-pay

## 2024-04-16 ENCOUNTER — Other Ambulatory Visit (HOSPITAL_BASED_OUTPATIENT_CLINIC_OR_DEPARTMENT_OTHER): Payer: Self-pay

## 2024-04-16 MED ORDER — JARDIANCE 25 MG PO TABS
ORAL_TABLET | Freq: Every day | ORAL | 1 refills | Status: AC
  Filled 2024-04-16: qty 45, 90d supply, fill #0

## 2024-04-24 ENCOUNTER — Other Ambulatory Visit (HOSPITAL_BASED_OUTPATIENT_CLINIC_OR_DEPARTMENT_OTHER): Payer: Self-pay

## 2024-04-24 ENCOUNTER — Other Ambulatory Visit (HOSPITAL_COMMUNITY): Payer: Self-pay

## 2024-05-02 ENCOUNTER — Ambulatory Visit: Admitting: Neurology

## 2024-08-06 ENCOUNTER — Ambulatory Visit: Admitting: Podiatry
